# Patient Record
Sex: Male | Born: 1935
Health system: Southern US, Community
[De-identification: ages and names within clinical notes are randomized; demographics above are authoritative.]

## PROBLEM LIST (undated history)

## (undated) DIAGNOSIS — I429 Cardiomyopathy, unspecified: Secondary | ICD-10-CM

## (undated) DIAGNOSIS — IMO0001 Reserved for inherently not codable concepts without codable children: Secondary | ICD-10-CM

## (undated) DIAGNOSIS — I10 Essential (primary) hypertension: Secondary | ICD-10-CM

## (undated) DIAGNOSIS — K5733 Diverticulitis of large intestine without perforation or abscess with bleeding: Secondary | ICD-10-CM

## (undated) DIAGNOSIS — N183 Chronic kidney disease, stage 3 unspecified: Secondary | ICD-10-CM

## (undated) DIAGNOSIS — G8929 Other chronic pain: Secondary | ICD-10-CM

## (undated) DIAGNOSIS — I340 Nonrheumatic mitral (valve) insufficiency: Secondary | ICD-10-CM

## (undated) DIAGNOSIS — G4730 Sleep apnea, unspecified: Secondary | ICD-10-CM

## (undated) DIAGNOSIS — I447 Left bundle-branch block, unspecified: Secondary | ICD-10-CM

## (undated) DIAGNOSIS — N401 Enlarged prostate with lower urinary tract symptoms: Secondary | ICD-10-CM

## (undated) DIAGNOSIS — F32A Depression, unspecified: Secondary | ICD-10-CM

## (undated) DIAGNOSIS — M545 Low back pain, unspecified: Secondary | ICD-10-CM

## (undated) DIAGNOSIS — R339 Retention of urine, unspecified: Secondary | ICD-10-CM

## (undated) DIAGNOSIS — H9190 Unspecified hearing loss, unspecified ear: Secondary | ICD-10-CM

## (undated) DIAGNOSIS — H4310 Vitreous hemorrhage, unspecified eye: Secondary | ICD-10-CM

## (undated) DIAGNOSIS — K219 Gastro-esophageal reflux disease without esophagitis: Secondary | ICD-10-CM

## (undated) DIAGNOSIS — Z8719 Personal history of other diseases of the digestive system: Secondary | ICD-10-CM

## (undated) DIAGNOSIS — H409 Unspecified glaucoma: Secondary | ICD-10-CM

## (undated) DIAGNOSIS — F419 Anxiety disorder, unspecified: Secondary | ICD-10-CM

## (undated) DIAGNOSIS — F329 Major depressive disorder, single episode, unspecified: Secondary | ICD-10-CM

## (undated) DIAGNOSIS — M1990 Unspecified osteoarthritis, unspecified site: Secondary | ICD-10-CM

## (undated) DIAGNOSIS — N138 Other obstructive and reflux uropathy: Secondary | ICD-10-CM

## (undated) DIAGNOSIS — Z5189 Encounter for other specified aftercare: Secondary | ICD-10-CM

## (undated) DIAGNOSIS — I2510 Atherosclerotic heart disease of native coronary artery without angina pectoris: Secondary | ICD-10-CM

## (undated) DIAGNOSIS — I5022 Chronic systolic (congestive) heart failure: Secondary | ICD-10-CM

## (undated) DIAGNOSIS — I4821 Permanent atrial fibrillation: Secondary | ICD-10-CM

## (undated) DIAGNOSIS — Z973 Presence of spectacles and contact lenses: Secondary | ICD-10-CM

## (undated) DIAGNOSIS — Z950 Presence of cardiac pacemaker: Secondary | ICD-10-CM

## (undated) DIAGNOSIS — I219 Acute myocardial infarction, unspecified: Secondary | ICD-10-CM

## (undated) DIAGNOSIS — C61 Malignant neoplasm of prostate: Secondary | ICD-10-CM

## (undated) DIAGNOSIS — I480 Paroxysmal atrial fibrillation: Secondary | ICD-10-CM

## (undated) DIAGNOSIS — N529 Male erectile dysfunction, unspecified: Secondary | ICD-10-CM

## (undated) DIAGNOSIS — IMO0002 Reserved for concepts with insufficient information to code with codable children: Secondary | ICD-10-CM

## (undated) DIAGNOSIS — H9191 Unspecified hearing loss, right ear: Secondary | ICD-10-CM

## (undated) DIAGNOSIS — R001 Bradycardia, unspecified: Secondary | ICD-10-CM

## (undated) DIAGNOSIS — D329 Benign neoplasm of meninges, unspecified: Secondary | ICD-10-CM

## (undated) DIAGNOSIS — Z974 Presence of external hearing-aid: Secondary | ICD-10-CM

## (undated) HISTORY — PX: TRANSURETHRAL RESECTION OF PROSTATE: SHX73

## (undated) HISTORY — PX: SHOULDER OPEN ROTATOR CUFF REPAIR: SHX2407

## (undated) HISTORY — PX: CLOSED REDUCTION HIP DISLOCATION: SUR221

## (undated) HISTORY — PX: APPENDECTOMY: SHX54

## (undated) HISTORY — PX: CHOLECYSTECTOMY: SHX55

## (undated) HISTORY — PX: INGUINAL HERNIA REPAIR: SUR1180

## (undated) HISTORY — PX: PENILE PROSTHESIS IMPLANT: SHX240

## (undated) HISTORY — PX: CATARACT EXTRACTION W/ INTRAOCULAR LENS  IMPLANT, BILATERAL: SHX1307

## (undated) HISTORY — PX: TOTAL HIP ARTHROPLASTY: SHX124

## (undated) HISTORY — PX: INNER EAR SURGERY: SHX679

## (undated) HISTORY — PX: REVISION TOTAL KNEE ARTHROPLASTY: SUR1280

## (undated) HISTORY — PX: TOTAL HIP REVISION: SHX763

---

## 1988-10-27 HISTORY — PX: PENILE PROSTHESIS IMPLANT: SHX240

## 1995-02-27 HISTORY — PX: TOTAL KNEE ARTHROPLASTY: SHX125

## 1998-01-31 ENCOUNTER — Encounter: Payer: Self-pay | Admitting: Neurological Surgery

## 1998-01-31 ENCOUNTER — Ambulatory Visit (HOSPITAL_COMMUNITY): Admission: RE | Admit: 1998-01-31 | Discharge: 1998-01-31 | Payer: Self-pay | Admitting: Neurological Surgery

## 1998-02-26 HISTORY — PX: OTHER SURGICAL HISTORY: SHX169

## 1998-03-02 ENCOUNTER — Ambulatory Visit (HOSPITAL_COMMUNITY): Admission: RE | Admit: 1998-03-02 | Discharge: 1998-03-02 | Payer: Self-pay | Admitting: Neurological Surgery

## 1998-03-02 ENCOUNTER — Encounter: Payer: Self-pay | Admitting: Neurological Surgery

## 1999-03-10 ENCOUNTER — Encounter: Payer: Self-pay | Admitting: Orthopedic Surgery

## 1999-03-10 ENCOUNTER — Encounter: Admission: RE | Admit: 1999-03-10 | Discharge: 1999-03-10 | Payer: Self-pay | Admitting: Orthopedic Surgery

## 1999-10-29 ENCOUNTER — Ambulatory Visit (HOSPITAL_COMMUNITY): Admission: RE | Admit: 1999-10-29 | Discharge: 1999-10-29 | Payer: Self-pay | Admitting: Neurosurgery

## 1999-11-03 ENCOUNTER — Ambulatory Visit (HOSPITAL_COMMUNITY): Admission: RE | Admit: 1999-11-03 | Discharge: 1999-11-03 | Payer: Self-pay | Admitting: Neurosurgery

## 2000-01-01 ENCOUNTER — Encounter: Admission: RE | Admit: 2000-01-01 | Discharge: 2000-01-11 | Payer: Self-pay | Admitting: Gastroenterology

## 2000-01-11 ENCOUNTER — Encounter: Payer: Self-pay | Admitting: General Surgery

## 2000-01-11 ENCOUNTER — Ambulatory Visit (HOSPITAL_COMMUNITY): Admission: RE | Admit: 2000-01-11 | Discharge: 2000-01-11 | Payer: Self-pay | Admitting: General Surgery

## 2000-01-12 ENCOUNTER — Inpatient Hospital Stay (HOSPITAL_COMMUNITY): Admission: RE | Admit: 2000-01-12 | Discharge: 2000-01-17 | Payer: Self-pay | Admitting: General Surgery

## 2000-08-05 ENCOUNTER — Encounter: Payer: Self-pay | Admitting: Anesthesiology

## 2000-08-05 ENCOUNTER — Encounter: Admission: RE | Admit: 2000-08-05 | Discharge: 2000-08-05 | Payer: Self-pay | Admitting: Anesthesiology

## 2001-05-30 ENCOUNTER — Encounter: Payer: Self-pay | Admitting: Gastroenterology

## 2001-05-30 ENCOUNTER — Encounter: Admission: RE | Admit: 2001-05-30 | Discharge: 2001-05-30 | Payer: Self-pay | Admitting: Gastroenterology

## 2002-02-26 HISTORY — PX: TOTAL KNEE ARTHROPLASTY: SHX125

## 2002-07-08 ENCOUNTER — Encounter: Payer: Self-pay | Admitting: Gastroenterology

## 2002-07-08 ENCOUNTER — Encounter: Admission: RE | Admit: 2002-07-08 | Discharge: 2002-07-08 | Payer: Self-pay | Admitting: Gastroenterology

## 2002-11-15 ENCOUNTER — Ambulatory Visit (HOSPITAL_BASED_OUTPATIENT_CLINIC_OR_DEPARTMENT_OTHER): Admission: RE | Admit: 2002-11-15 | Discharge: 2002-11-15 | Payer: Self-pay | Admitting: Internal Medicine

## 2003-02-27 HISTORY — PX: TOTAL HIP ARTHROPLASTY: SHX124

## 2003-05-20 ENCOUNTER — Emergency Department (HOSPITAL_COMMUNITY): Admission: EM | Admit: 2003-05-20 | Discharge: 2003-05-20 | Payer: Self-pay | Admitting: Emergency Medicine

## 2003-08-19 ENCOUNTER — Encounter: Admission: RE | Admit: 2003-08-19 | Discharge: 2003-08-19 | Payer: Self-pay | Admitting: Orthopedic Surgery

## 2003-11-08 ENCOUNTER — Inpatient Hospital Stay (HOSPITAL_COMMUNITY): Admission: RE | Admit: 2003-11-08 | Discharge: 2003-11-12 | Payer: Self-pay | Admitting: Orthopedic Surgery

## 2004-01-05 ENCOUNTER — Encounter: Admission: RE | Admit: 2004-01-05 | Discharge: 2004-01-05 | Payer: Self-pay | Admitting: Gastroenterology

## 2004-02-15 ENCOUNTER — Ambulatory Visit (HOSPITAL_COMMUNITY): Admission: RE | Admit: 2004-02-15 | Discharge: 2004-02-15 | Payer: Self-pay | Admitting: Gastroenterology

## 2004-03-03 ENCOUNTER — Ambulatory Visit: Payer: Self-pay | Admitting: Internal Medicine

## 2004-05-16 ENCOUNTER — Ambulatory Visit: Payer: Self-pay | Admitting: Internal Medicine

## 2005-01-09 ENCOUNTER — Encounter: Admission: RE | Admit: 2005-01-09 | Discharge: 2005-01-09 | Payer: Self-pay | Admitting: Ophthalmology

## 2005-01-12 ENCOUNTER — Ambulatory Visit (HOSPITAL_BASED_OUTPATIENT_CLINIC_OR_DEPARTMENT_OTHER): Admission: RE | Admit: 2005-01-12 | Discharge: 2005-01-12 | Payer: Self-pay | Admitting: Ophthalmology

## 2005-01-12 ENCOUNTER — Ambulatory Visit (HOSPITAL_COMMUNITY): Admission: RE | Admit: 2005-01-12 | Discharge: 2005-01-12 | Payer: Self-pay | Admitting: Ophthalmology

## 2005-02-26 HISTORY — PX: CARDIAC CATHETERIZATION: SHX172

## 2005-05-17 ENCOUNTER — Encounter: Admission: RE | Admit: 2005-05-17 | Discharge: 2005-05-17 | Payer: Self-pay | Admitting: Cardiology

## 2005-05-24 ENCOUNTER — Ambulatory Visit (HOSPITAL_COMMUNITY): Admission: RE | Admit: 2005-05-24 | Discharge: 2005-05-26 | Payer: Self-pay | Admitting: *Deleted

## 2005-05-25 ENCOUNTER — Encounter: Payer: Self-pay | Admitting: Vascular Surgery

## 2005-06-12 ENCOUNTER — Ambulatory Visit (HOSPITAL_COMMUNITY): Admission: RE | Admit: 2005-06-12 | Discharge: 2005-06-12 | Payer: Self-pay | Admitting: Cardiology

## 2005-06-19 ENCOUNTER — Ambulatory Visit (HOSPITAL_COMMUNITY): Admission: RE | Admit: 2005-06-19 | Discharge: 2005-06-19 | Payer: Self-pay | Admitting: Cardiology

## 2008-03-25 ENCOUNTER — Inpatient Hospital Stay (HOSPITAL_COMMUNITY): Admission: RE | Admit: 2008-03-25 | Discharge: 2008-03-27 | Payer: Self-pay | Admitting: Orthopedic Surgery

## 2008-03-25 HISTORY — PX: TOTAL HIP ARTHROPLASTY: SHX124

## 2008-04-19 ENCOUNTER — Encounter: Admission: RE | Admit: 2008-04-19 | Discharge: 2008-05-04 | Payer: Self-pay | Admitting: Orthopedic Surgery

## 2008-04-28 ENCOUNTER — Inpatient Hospital Stay (HOSPITAL_COMMUNITY): Admission: EM | Admit: 2008-04-28 | Discharge: 2008-04-29 | Payer: Self-pay | Admitting: Emergency Medicine

## 2008-07-13 ENCOUNTER — Encounter: Admission: RE | Admit: 2008-07-13 | Discharge: 2008-07-13 | Payer: Self-pay | Admitting: Cardiology

## 2008-07-14 ENCOUNTER — Inpatient Hospital Stay (HOSPITAL_BASED_OUTPATIENT_CLINIC_OR_DEPARTMENT_OTHER): Admission: RE | Admit: 2008-07-14 | Discharge: 2008-07-14 | Payer: Self-pay | Admitting: Cardiology

## 2008-08-03 ENCOUNTER — Inpatient Hospital Stay (HOSPITAL_COMMUNITY): Admission: AD | Admit: 2008-08-03 | Discharge: 2008-08-04 | Payer: Self-pay | Admitting: Cardiology

## 2008-08-03 HISTORY — PX: CORONARY ANGIOPLASTY WITH STENT PLACEMENT: SHX49

## 2008-10-14 ENCOUNTER — Ambulatory Visit: Admission: RE | Admit: 2008-10-14 | Discharge: 2008-10-14 | Payer: Self-pay | Admitting: Cardiology

## 2008-10-17 ENCOUNTER — Emergency Department (HOSPITAL_COMMUNITY): Admission: EM | Admit: 2008-10-17 | Discharge: 2008-10-17 | Payer: Self-pay | Admitting: Emergency Medicine

## 2008-10-22 ENCOUNTER — Observation Stay (HOSPITAL_COMMUNITY): Admission: AD | Admit: 2008-10-22 | Discharge: 2008-10-24 | Payer: Self-pay | Admitting: Gastroenterology

## 2008-12-27 ENCOUNTER — Ambulatory Visit (HOSPITAL_COMMUNITY): Admission: RE | Admit: 2008-12-27 | Discharge: 2008-12-27 | Payer: Self-pay | Admitting: Orthopedic Surgery

## 2009-05-23 ENCOUNTER — Inpatient Hospital Stay (HOSPITAL_COMMUNITY): Admission: RE | Admit: 2009-05-23 | Discharge: 2009-05-27 | Payer: Self-pay | Admitting: Orthopedic Surgery

## 2009-06-20 ENCOUNTER — Ambulatory Visit (HOSPITAL_COMMUNITY): Admission: EM | Admit: 2009-06-20 | Discharge: 2009-06-21 | Payer: Self-pay | Admitting: Emergency Medicine

## 2009-06-22 ENCOUNTER — Inpatient Hospital Stay (HOSPITAL_COMMUNITY): Admission: EM | Admit: 2009-06-22 | Discharge: 2009-06-30 | Payer: Self-pay | Admitting: Emergency Medicine

## 2010-04-26 ENCOUNTER — Other Ambulatory Visit: Payer: Self-pay | Admitting: Ophthalmology

## 2010-04-26 DIAGNOSIS — H509 Unspecified strabismus: Secondary | ICD-10-CM

## 2010-05-01 ENCOUNTER — Other Ambulatory Visit: Payer: Self-pay

## 2010-05-03 ENCOUNTER — Other Ambulatory Visit: Payer: Self-pay

## 2010-05-05 ENCOUNTER — Other Ambulatory Visit: Payer: Self-pay

## 2010-05-16 ENCOUNTER — Other Ambulatory Visit: Payer: Self-pay | Admitting: Gastroenterology

## 2010-05-16 LAB — CBC
HCT: 32.7 % — ABNORMAL LOW (ref 39.0–52.0)
HCT: 35.4 % — ABNORMAL LOW (ref 39.0–52.0)
HCT: 35.5 % — ABNORMAL LOW (ref 39.0–52.0)
HCT: 36 % — ABNORMAL LOW (ref 39.0–52.0)
Hemoglobin: 12.3 g/dL — ABNORMAL LOW (ref 13.0–17.0)
Hemoglobin: 13.8 g/dL (ref 13.0–17.0)
Hemoglobin: 12.2 g/dL — ABNORMAL LOW (ref 13.0–17.0)
Hemoglobin: 12.4 g/dL — ABNORMAL LOW (ref 13.0–17.0)
MCHC: 34.4 g/dL (ref 30.0–36.0)
MCHC: 34.4 g/dL (ref 30.0–36.0)
MCHC: 34.8 g/dL (ref 30.0–36.0)
MCHC: 34.2 g/dL (ref 30.0–36.0)
MCHC: 34.4 g/dL (ref 30.0–36.0)
MCV: 91 fL (ref 78.0–100.0)
MCV: 91.7 fL (ref 78.0–100.0)
MCV: 92.3 fL (ref 78.0–100.0)
Platelets: 104 10*3/uL — ABNORMAL LOW (ref 150–400)
Platelets: 115 10*3/uL — ABNORMAL LOW (ref 150–400)
Platelets: 122 10*3/uL — ABNORMAL LOW (ref 150–400)
Platelets: 136 10*3/uL — ABNORMAL LOW (ref 150–400)
Platelets: 111 10*3/uL — ABNORMAL LOW (ref 150–400)
Platelets: 122 10*3/uL — ABNORMAL LOW (ref 150–400)
RBC: 3.87 MIL/uL — ABNORMAL LOW (ref 4.22–5.81)
RBC: 4.38 MIL/uL (ref 4.22–5.81)
RBC: 3.89 MIL/uL — ABNORMAL LOW (ref 4.22–5.81)
RBC: 4.22 MIL/uL (ref 4.22–5.81)
RBC: 4.4 MIL/uL (ref 4.22–5.81)
RDW: 14.1 % (ref 11.5–15.5)
RDW: 14.6 % (ref 11.5–15.5)
RDW: 14.1 % (ref 11.5–15.5)
RDW: 14.2 % (ref 11.5–15.5)
WBC: 6 10*3/uL (ref 4.0–10.5)
WBC: 8.6 10*3/uL (ref 4.0–10.5)
WBC: 9.5 10*3/uL (ref 4.0–10.5)
WBC: 9.9 10*3/uL (ref 4.0–10.5)
WBC: 10.7 10*3/uL — ABNORMAL HIGH (ref 4.0–10.5)
WBC: 5.8 10*3/uL (ref 4.0–10.5)
WBC: 9.3 10*3/uL (ref 4.0–10.5)

## 2010-05-16 LAB — BASIC METABOLIC PANEL
BUN: 12 mg/dL (ref 6–23)
BUN: 8 mg/dL (ref 6–23)
BUN: 8 mg/dL (ref 6–23)
BUN: 9 mg/dL (ref 6–23)
BUN: 10 mg/dL (ref 6–23)
BUN: 7 mg/dL (ref 6–23)
CO2: 27 mEq/L (ref 19–32)
CO2: 27 mEq/L (ref 19–32)
CO2: 28 mEq/L (ref 19–32)
CO2: 27 mEq/L (ref 19–32)
Calcium: 9.1 mg/dL (ref 8.4–10.5)
Calcium: 9.3 mg/dL (ref 8.4–10.5)
Calcium: 8.2 mg/dL — ABNORMAL LOW (ref 8.4–10.5)
Calcium: 8.6 mg/dL (ref 8.4–10.5)
Calcium: 8.7 mg/dL (ref 8.4–10.5)
Chloride: 104 mEq/L (ref 96–112)
Chloride: 104 mEq/L (ref 96–112)
Chloride: 105 mEq/L (ref 96–112)
Creatinine, Ser: 0.93 mg/dL (ref 0.4–1.5)
Creatinine, Ser: 1.1 mg/dL (ref 0.4–1.5)
Creatinine, Ser: 1.12 mg/dL (ref 0.4–1.5)
GFR calc Af Amer: >60 mL/min (ref >60)
GFR calc Af Amer: >60 mL/min (ref >60)
GFR calc Af Amer: >60 mL/min (ref >60)
GFR calc non Af Amer: 59 mL/min — ABNORMAL LOW (ref >60)
GFR calc non Af Amer: >60 mL/min (ref >60)
GFR calc non Af Amer: >60 mL/min (ref >60)
GFR calc non Af Amer: >60 mL/min (ref >60)
GFR calc non Af Amer: >60 mL/min (ref >60)
Glucose, Bld: 100 mg/dL — ABNORMAL HIGH (ref 70–99)
Glucose, Bld: 106 mg/dL — ABNORMAL HIGH (ref 70–99)
Glucose, Bld: 101 mg/dL — ABNORMAL HIGH (ref 70–99)
Glucose, Bld: 115 mg/dL — ABNORMAL HIGH (ref 70–99)
Potassium: 4.1 mEq/L (ref 3.5–5.1)
Potassium: 4.2 mEq/L (ref 3.5–5.1)
Potassium: 4.2 mEq/L (ref 3.5–5.1)
Sodium: 137 mEq/L (ref 135–145)
Sodium: 140 mEq/L (ref 135–145)
Sodium: 134 mEq/L — ABNORMAL LOW (ref 135–145)
Sodium: 137 mEq/L (ref 135–145)
Sodium: 140 mEq/L (ref 135–145)

## 2010-05-16 LAB — COMPREHENSIVE METABOLIC PANEL
AST: 28 U/L (ref 0–37)
BUN: 13 mg/dL (ref 6–23)
CO2: 24 mEq/L (ref 19–32)
Calcium: 8.7 mg/dL (ref 8.4–10.5)
Creatinine, Ser: 1.09 mg/dL (ref 0.4–1.5)
GFR calc Af Amer: >60 mL/min (ref >60)
GFR calc non Af Amer: >60 mL/min (ref >60)
Glucose, Bld: 106 mg/dL — ABNORMAL HIGH (ref 70–99)

## 2010-05-16 LAB — URINALYSIS, MICROSCOPIC ONLY
Bilirubin Urine: NEGATIVE
Glucose, UA: NEGATIVE mg/dL
Ketones, ur: NEGATIVE mg/dL
pH: 5.5 (ref 5.0–8.0)

## 2010-05-16 LAB — DIFFERENTIAL
Basophils Absolute: 0 10*3/uL (ref 0.0–0.1)
Basophils Relative: 0 % (ref 0–1)
Monocytes Absolute: 0.6 10*3/uL (ref 0.1–1.0)
Neutro Abs: 9 10*3/uL — ABNORMAL HIGH (ref 1.7–7.7)
Neutrophils Relative %: 83 % — ABNORMAL HIGH (ref 43–77)

## 2010-05-16 LAB — PROTIME-INR
INR: 1.1 (ref 0.00–1.49)
Prothrombin Time: 13.3 seconds (ref 11.6–15.2)
Prothrombin Time: 14.1 seconds (ref 11.6–15.2)

## 2010-05-16 LAB — TYPE AND SCREEN
ABO/RH(D): O POS
ABO/RH(D): O POS
ABO/RH(D): O POS
Antibody Screen: NEGATIVE
Antibody Screen: NEGATIVE

## 2010-05-16 LAB — URINE CULTURE

## 2010-05-16 LAB — APTT: aPTT: 29 seconds (ref 24–37)

## 2010-05-17 LAB — BASIC METABOLIC PANEL
BUN: 7 mg/dL (ref 6–23)
Chloride: 104 mEq/L (ref 96–112)
Glucose, Bld: 110 mg/dL — ABNORMAL HIGH (ref 70–99)
Potassium: 3.5 mEq/L (ref 3.5–5.1)

## 2010-05-19 ENCOUNTER — Ambulatory Visit
Admission: RE | Admit: 2010-05-19 | Discharge: 2010-05-19 | Disposition: A | Discharge status: Home / Self Care | Payer: MEDICARE | Source: Ambulatory Visit | Attending: Gastroenterology | Admitting: Gastroenterology

## 2010-05-21 LAB — COMPREHENSIVE METABOLIC PANEL
Albumin: 4.1 g/dL (ref 3.5–5.2)
BUN: 13 mg/dL (ref 6–23)
Chloride: 107 mEq/L (ref 96–112)
Creatinine, Ser: 1.09 mg/dL (ref 0.4–1.5)
GFR calc non Af Amer: >60 mL/min (ref >60)
Total Bilirubin: 0.5 mg/dL (ref 0.3–1.2)

## 2010-05-21 LAB — BASIC METABOLIC PANEL
BUN: 10 mg/dL (ref 6–23)
BUN: 10 mg/dL (ref 6–23)
BUN: 6 mg/dL (ref 6–23)
Calcium: 8.1 mg/dL — ABNORMAL LOW (ref 8.4–10.5)
Calcium: 8.4 mg/dL (ref 8.4–10.5)
Chloride: 109 mEq/L (ref 96–112)
GFR calc non Af Amer: >60 mL/min (ref >60)
GFR calc non Af Amer: >60 mL/min (ref >60)
GFR calc non Af Amer: >60 mL/min (ref >60)
Glucose, Bld: 132 mg/dL — ABNORMAL HIGH (ref 70–99)
Potassium: 3.9 mEq/L (ref 3.5–5.1)
Potassium: 4 mEq/L (ref 3.5–5.1)
Sodium: 139 mEq/L (ref 135–145)
Sodium: 140 mEq/L (ref 135–145)

## 2010-05-21 LAB — CBC
HCT: 33.8 % — ABNORMAL LOW (ref 39.0–52.0)
HCT: 34.6 % — ABNORMAL LOW (ref 39.0–52.0)
HCT: 37.1 % — ABNORMAL LOW (ref 39.0–52.0)
HCT: 48.3 % (ref 39.0–52.0)
Hemoglobin: 11.6 g/dL — ABNORMAL LOW (ref 13.0–17.0)
Hemoglobin: 11.7 g/dL — ABNORMAL LOW (ref 13.0–17.0)
MCHC: 34.3 g/dL (ref 30.0–36.0)
MCV: 93.8 fL (ref 78.0–100.0)
MCV: 94.2 fL (ref 78.0–100.0)
Platelets: 119 10*3/uL — ABNORMAL LOW (ref 150–400)
Platelets: 124 10*3/uL — ABNORMAL LOW (ref 150–400)
Platelets: 124 10*3/uL — ABNORMAL LOW (ref 150–400)
RBC: 3.6 MIL/uL — ABNORMAL LOW (ref 4.22–5.81)
RBC: 3.68 MIL/uL — ABNORMAL LOW (ref 4.22–5.81)
RDW: 14.6 % (ref 11.5–15.5)
RDW: 14.7 % (ref 11.5–15.5)
WBC: 7.2 10*3/uL (ref 4.0–10.5)
WBC: 7.9 10*3/uL (ref 4.0–10.5)
WBC: 8.9 10*3/uL (ref 4.0–10.5)

## 2010-05-21 LAB — DIFFERENTIAL
Basophils Absolute: 0 10*3/uL (ref 0.0–0.1)
Lymphocytes Relative: 22 % (ref 12–46)
Monocytes Absolute: 0.7 10*3/uL (ref 0.1–1.0)
Neutro Abs: 4.5 10*3/uL (ref 1.7–7.7)
Neutrophils Relative %: 63 % (ref 43–77)

## 2010-05-21 LAB — PROTIME-INR
INR: 1.02 (ref 0.00–1.49)
Prothrombin Time: 13.3 seconds (ref 11.6–15.2)

## 2010-05-21 LAB — CARDIAC PANEL(CRET KIN+CKTOT+MB+TROPI)
CK, MB: 0.8 ng/mL (ref 0.3–4.0)
CK, MB: 0.9 ng/mL (ref 0.3–4.0)
CK, MB: 1.1 ng/mL (ref 0.3–4.0)
Relative Index: INVALID (ref 0.0–2.5)
Total CK: 73 U/L (ref 7–232)
Total CK: 83 U/L (ref 7–232)
Troponin I: 0.01 ng/mL (ref 0.00–0.06)

## 2010-05-21 LAB — URINALYSIS, ROUTINE W REFLEX MICROSCOPIC
Ketones, ur: NEGATIVE mg/dL
Nitrite: NEGATIVE
Protein, ur: NEGATIVE mg/dL
Urobilinogen, UA: 0.2 mg/dL (ref 0.0–1.0)

## 2010-05-21 LAB — CROSSMATCH
ABO/RH(D): O POS
Antibody Screen: NEGATIVE

## 2010-05-21 LAB — APTT: aPTT: 27 seconds (ref 24–37)

## 2010-05-21 LAB — ABO/RH: ABO/RH(D): O POS

## 2010-05-28 DIAGNOSIS — Z8719 Personal history of other diseases of the digestive system: Secondary | ICD-10-CM

## 2010-05-28 HISTORY — DX: Personal history of other diseases of the digestive system: Z87.19

## 2010-05-31 LAB — CBC
HCT: 43.1 % (ref 39.0–52.0)
MCHC: 33.1 g/dL (ref 30.0–36.0)
MCV: 87.6 fL (ref 78.0–100.0)
RBC: 4.92 MIL/uL (ref 4.22–5.81)

## 2010-05-31 LAB — DIFFERENTIAL
Basophils Absolute: 0 10*3/uL (ref 0.0–0.1)
Lymphocytes Relative: 20 % (ref 12–46)
Lymphs Abs: 1.5 10*3/uL (ref 0.7–4.0)
Neutro Abs: 5 10*3/uL (ref 1.7–7.7)
Neutrophils Relative %: 67 % (ref 43–77)

## 2010-05-31 LAB — COMPREHENSIVE METABOLIC PANEL
AST: 27 U/L (ref 0–37)
BUN: 15 mg/dL (ref 6–23)
CO2: 25 mEq/L (ref 19–32)
Calcium: 9.1 mg/dL (ref 8.4–10.5)
Chloride: 109 mEq/L (ref 96–112)
Creatinine, Ser: 1.06 mg/dL (ref 0.4–1.5)
GFR calc Af Amer: >60 mL/min (ref >60)
GFR calc non Af Amer: >60 mL/min (ref >60)
Glucose, Bld: 95 mg/dL (ref 70–99)
Total Bilirubin: 0.6 mg/dL (ref 0.3–1.2)

## 2010-05-31 LAB — URINALYSIS, ROUTINE W REFLEX MICROSCOPIC
Bilirubin Urine: NEGATIVE
Glucose, UA: NEGATIVE mg/dL
Hgb urine dipstick: NEGATIVE
Specific Gravity, Urine: 1.016 (ref 1.005–1.030)
Urobilinogen, UA: 0.2 mg/dL (ref 0.0–1.0)

## 2010-05-31 LAB — PROTIME-INR
INR: 1.05 (ref 0.00–1.49)
Prothrombin Time: 13.6 seconds (ref 11.6–15.2)

## 2010-05-31 LAB — APTT: aPTT: 30 seconds (ref 24–37)

## 2010-06-03 LAB — TYPE AND SCREEN: Antibody Screen: NEGATIVE

## 2010-06-03 LAB — CBC
HCT: 35.4 % — ABNORMAL LOW (ref 39.0–52.0)
HCT: 35.5 % — ABNORMAL LOW (ref 39.0–52.0)
Hemoglobin: 11.8 g/dL — ABNORMAL LOW (ref 13.0–17.0)
Hemoglobin: 11.9 g/dL — ABNORMAL LOW (ref 13.0–17.0)
MCHC: 33.3 g/dL (ref 30.0–36.0)
MCHC: 34.5 g/dL (ref 30.0–36.0)
MCV: 91.6 fL (ref 78.0–100.0)
MCV: 94.1 fL (ref 78.0–100.0)
Platelets: 162 10*3/uL (ref 150–400)
Platelets: 155 10*3/uL (ref 150–400)
RBC: 2.9 MIL/uL — ABNORMAL LOW (ref 4.22–5.81)
RBC: 3.87 MIL/uL — ABNORMAL LOW (ref 4.22–5.81)
RBC: 3.91 MIL/uL — ABNORMAL LOW (ref 4.22–5.81)
RBC: 3.46 MIL/uL — ABNORMAL LOW (ref 4.22–5.81)
RDW: 15.9 % — ABNORMAL HIGH (ref 11.5–15.5)
RDW: 14.6 % (ref 11.5–15.5)
WBC: 5.9 10*3/uL (ref 4.0–10.5)
WBC: 6 10*3/uL (ref 4.0–10.5)

## 2010-06-03 LAB — COMPREHENSIVE METABOLIC PANEL
ALT: 18 U/L (ref 0–53)
AST: 20 U/L (ref 0–37)
Albumin: 3.2 g/dL — ABNORMAL LOW (ref 3.5–5.2)
CO2: 26 mEq/L (ref 19–32)
Calcium: 8.3 mg/dL — ABNORMAL LOW (ref 8.4–10.5)
Chloride: 109 mEq/L (ref 96–112)
Creatinine, Ser: 0.93 mg/dL (ref 0.4–1.5)
GFR calc Af Amer: >60 mL/min (ref >60)
GFR calc non Af Amer: >60 mL/min (ref >60)
Sodium: 140 mEq/L (ref 135–145)
Total Bilirubin: 0.7 mg/dL (ref 0.3–1.2)

## 2010-06-03 LAB — DIFFERENTIAL
Basophils Absolute: 0 10*3/uL (ref 0.0–0.1)
Basophils Relative: 0 % (ref 0–1)
Eosinophils Absolute: 0.3 10*3/uL (ref 0.0–0.7)
Eosinophils Absolute: 0.1 10*3/uL (ref 0.0–0.7)
Eosinophils Relative: 5 % (ref 0–5)
Lymphocytes Relative: 19 % (ref 12–46)
Lymphs Abs: 1.1 10*3/uL (ref 0.7–4.0)
Monocytes Absolute: 0.5 10*3/uL (ref 0.1–1.0)
Monocytes Relative: 9 % (ref 3–12)
Neutrophils Relative %: 72 % (ref 43–77)

## 2010-06-03 LAB — POCT I-STAT, CHEM 8
BUN: 18 mg/dL (ref 6–23)
Calcium, Ion: 1.05 mmol/L — ABNORMAL LOW (ref 1.12–1.32)
Hemoglobin: 10.9 g/dL — ABNORMAL LOW (ref 13.0–17.0)
TCO2: 20 mmol/L (ref 0–100)

## 2010-06-03 LAB — PROTIME-INR
INR: 1.1 (ref 0.00–1.49)
Prothrombin Time: 13.3 seconds (ref 11.6–15.2)

## 2010-06-03 LAB — PREPARE RBC (CROSSMATCH)

## 2010-06-03 LAB — HEMOCCULT GUIAC POC 1CARD (OFFICE): Fecal Occult Bld: POSITIVE

## 2010-06-05 LAB — BASIC METABOLIC PANEL
BUN: 11 mg/dL (ref 6–23)
CO2: 25 mEq/L (ref 19–32)
Chloride: 108 mEq/L (ref 96–112)
Creatinine, Ser: 0.98 mg/dL (ref 0.4–1.5)

## 2010-06-05 LAB — CBC
MCHC: 33.8 g/dL (ref 30.0–36.0)
MCV: 91.8 fL (ref 78.0–100.0)
Platelets: 123 10*3/uL — ABNORMAL LOW (ref 150–400)

## 2010-06-06 LAB — POCT I-STAT 3, VENOUS BLOOD GAS (G3P V)
Bicarbonate: 24.3 mEq/L — ABNORMAL HIGH (ref 20.0–24.0)
pH, Ven: 7.385 — ABNORMAL HIGH (ref 7.250–7.300)
pH, Ven: 7.386 — ABNORMAL HIGH (ref 7.250–7.300)

## 2010-06-06 LAB — POCT I-STAT 3, ART BLOOD GAS (G3+)
Acid-base deficit: 1 mmol/L (ref 0.0–2.0)
Bicarbonate: 22.6 mEq/L (ref 20.0–24.0)
pCO2 arterial: 34.4 mmHg — ABNORMAL LOW (ref 35.0–45.0)
pO2, Arterial: 64 mmHg — ABNORMAL LOW (ref 80.0–100.0)

## 2010-06-08 ENCOUNTER — Ambulatory Visit (HOSPITAL_COMMUNITY)
Admission: RE | Admit: 2010-06-08 | Discharge: 2010-06-08 | Disposition: A | Discharge status: Home / Self Care | Payer: Medicare Other | Source: Ambulatory Visit | Attending: Gastroenterology | Admitting: Gastroenterology

## 2010-06-08 DIAGNOSIS — R1310 Dysphagia, unspecified: Secondary | ICD-10-CM | POA: Insufficient documentation

## 2010-06-08 DIAGNOSIS — Z7902 Long term (current) use of antithrombotics/antiplatelets: Secondary | ICD-10-CM | POA: Insufficient documentation

## 2010-06-08 DIAGNOSIS — Z7982 Long term (current) use of aspirin: Secondary | ICD-10-CM | POA: Insufficient documentation

## 2010-06-08 LAB — TSH: TSH: 1.449 u[IU]/mL (ref 0.350–4.500)

## 2010-06-08 LAB — URINE CULTURE

## 2010-06-08 LAB — BASIC METABOLIC PANEL
BUN: 16 mg/dL (ref 6–23)
CO2: 21 mEq/L (ref 19–32)
Chloride: 110 mEq/L (ref 96–112)
GFR calc non Af Amer: >60 mL/min (ref >60)
Glucose, Bld: 114 mg/dL — ABNORMAL HIGH (ref 70–99)
Potassium: 3.8 mEq/L (ref 3.5–5.1)

## 2010-06-08 LAB — URINALYSIS, ROUTINE W REFLEX MICROSCOPIC
Bilirubin Urine: NEGATIVE
Glucose, UA: NEGATIVE mg/dL
Ketones, ur: NEGATIVE mg/dL
Protein, ur: NEGATIVE mg/dL
pH: 7 (ref 5.0–8.0)

## 2010-06-08 LAB — CBC
HCT: 37.3 % — ABNORMAL LOW (ref 39.0–52.0)
Hemoglobin: 12.5 g/dL — ABNORMAL LOW (ref 13.0–17.0)
MCHC: 33.5 g/dL (ref 30.0–36.0)
MCV: 93.1 fL (ref 78.0–100.0)
Platelets: 113 10*3/uL — ABNORMAL LOW (ref 150–400)
RDW: 14.9 % (ref 11.5–15.5)

## 2010-06-08 LAB — DIFFERENTIAL
Basophils Absolute: 0 10*3/uL (ref 0.0–0.1)
Eosinophils Absolute: 0.1 10*3/uL (ref 0.0–0.7)
Eosinophils Relative: 1 % (ref 0–5)
Monocytes Absolute: 0.7 10*3/uL (ref 0.1–1.0)

## 2010-06-08 LAB — POCT I-STAT, CHEM 8
BUN: 16 mg/dL (ref 6–23)
Creatinine, Ser: 0.9 mg/dL (ref 0.4–1.5)
Hemoglobin: 12.6 g/dL — ABNORMAL LOW (ref 13.0–17.0)
Potassium: 3.9 mEq/L (ref 3.5–5.1)
Sodium: 141 mEq/L (ref 135–145)
TCO2: 20 mmol/L (ref 0–100)

## 2010-06-08 LAB — POCT CARDIAC MARKERS: Troponin i, poc: <0.05 ng/mL (ref 0.00–0.09)

## 2010-06-08 LAB — CARDIAC PANEL(CRET KIN+CKTOT+MB+TROPI)
CK, MB: 6.9 ng/mL — ABNORMAL HIGH (ref 0.3–4.0)
Relative Index: 1.8 (ref 0.0–2.5)

## 2010-06-08 LAB — PROTIME-INR: Prothrombin Time: 15 seconds (ref 11.6–15.2)

## 2010-06-12 LAB — URINALYSIS, ROUTINE W REFLEX MICROSCOPIC
Glucose, UA: NEGATIVE mg/dL
Protein, ur: NEGATIVE mg/dL
Specific Gravity, Urine: 1.012 (ref 1.005–1.030)
Urobilinogen, UA: 0.2 mg/dL (ref 0.0–1.0)

## 2010-06-12 LAB — CBC
HCT: 30.3 % — ABNORMAL LOW (ref 39.0–52.0)
Hemoglobin: 10.1 g/dL — ABNORMAL LOW (ref 13.0–17.0)
MCHC: 33.6 g/dL (ref 30.0–36.0)
MCV: 93.3 fL (ref 78.0–100.0)
MCV: 95.8 fL (ref 78.0–100.0)
Platelets: 128 10*3/uL — ABNORMAL LOW (ref 150–400)
Platelets: 143 10*3/uL — ABNORMAL LOW (ref 150–400)
RBC: 3.16 MIL/uL — ABNORMAL LOW (ref 4.22–5.81)
RBC: 3.67 MIL/uL — ABNORMAL LOW (ref 4.22–5.81)
RDW: 14 % (ref 11.5–15.5)
WBC: 8.7 10*3/uL (ref 4.0–10.5)
WBC: 8.8 10*3/uL (ref 4.0–10.5)

## 2010-06-12 LAB — BASIC METABOLIC PANEL
BUN: 9 mg/dL (ref 6–23)
Chloride: 107 mEq/L (ref 96–112)
Creatinine, Ser: 1.1 mg/dL (ref 0.4–1.5)
GFR calc Af Amer: >60 mL/min (ref >60)
GFR calc Af Amer: >60 mL/min (ref >60)
GFR calc non Af Amer: >60 mL/min (ref >60)
Potassium: 4.1 mEq/L (ref 3.5–5.1)
Potassium: 4.2 mEq/L (ref 3.5–5.1)

## 2010-06-12 LAB — COMPREHENSIVE METABOLIC PANEL
ALT: 18 U/L (ref 0–53)
AST: 24 U/L (ref 0–37)
Albumin: 3.7 g/dL (ref 3.5–5.2)
Calcium: 9.5 mg/dL (ref 8.4–10.5)
Creatinine, Ser: 1.15 mg/dL (ref 0.4–1.5)
GFR calc Af Amer: >60 mL/min (ref >60)
GFR calc non Af Amer: >60 mL/min (ref >60)
Sodium: 144 mEq/L (ref 135–145)
Total Protein: 6.1 g/dL (ref 6.0–8.3)

## 2010-06-12 LAB — DIFFERENTIAL
Eosinophils Relative: 4 % (ref 0–5)
Lymphocytes Relative: 22 % (ref 12–46)
Lymphs Abs: 1.7 10*3/uL (ref 0.7–4.0)
Monocytes Absolute: 0.6 10*3/uL (ref 0.1–1.0)
Monocytes Relative: 8 % (ref 3–12)

## 2010-06-12 LAB — PROTIME-INR: Prothrombin Time: 16.1 seconds — ABNORMAL HIGH (ref 11.6–15.2)

## 2010-06-12 LAB — ABO/RH: ABO/RH(D): O POS

## 2010-06-12 LAB — CROSSMATCH
ABO/RH(D): O POS
Antibody Screen: NEGATIVE

## 2010-06-12 NOTE — Op Note (Signed)
  NAMETIBOR, LEMMONS              ACCOUNT NO.:  [id number]  MEDICAL RECORD NO.:  [id number]           PATIENT TYPE:  O  LOCATION:  WLEN                         FACILITY:  [facility]  PHYSICIAN:  Danise Edge, M.D.   DATE OF BIRTH:  [birth year]  DATE OF PROCEDURE:  06/08/2010 DATE OF DISCHARGE:                              OPERATIVE REPORT   OPERATION:  Diagnostic esophagogastroduodenoscopy.  HISTORY:  The patient is an 77 year old male, born 04/08/1926.  The patient is experiencing intermittent esophageal dysphagia.  He underwent a barium esophagram with barium tablet, which showed no obstruction, but did show thickened folds in the midesophagus.  The patient chronically takes aspirin and Plavix.  ENDOSCOPIST:  Danise Edge, M.D.  PREMEDICATION:  Fentanyl 75 mcg, Versed 7.5 mg.  PROCEDURE:  After obtaining informed consent, the patient was placed in the left lateral decubitus position.  The Pentax gastroscope was passed through the posterior hypopharynx into the proximal esophagus without difficulty.  The hypopharynx, larynx, and vocal cords appeared normal.  Esophagoscopy:  The proximal mid and lower segments of the esophageal mucosa appeared normal.  There is no endoscopic evidence for the presence of erosive esophagitis, Barrett esophagus, or esophageal obstruction.  Gastroscopy:  Retroflexed view of the gastric cardia and fundus was normal.  The gastric body and antrum appeared normal.  The pyloric mucosa is intensely red, but without ulceration.  The pylorus is patent.  Duodenoscopy:  The duodenal bulb and descending duodenum appeared normal.  Assessment:  Essentially normal esophagogastroduodenoscopy except for an intensely red pylorus, unassociated with ulceration.  Plan:  The patient chronically takes aspirin and Plavix.  I will place him on a small dose of generic Prevacid 15 mg before breakfast each morning to prevent gastrointestinal bleeding as a result of  aspirin and Plavix.          ______________________________ Danise Edge, M.D.     MJ/MEDQ  D:  06/08/2010  T:  06/09/2010  Job:  [id number]  Electronically Signed by Danise Edge M.D. on 06/11/2010 02:27:41 PM

## 2010-07-11 NOTE — Op Note (Signed)
[name], [name]              ACCOUNT NO.:  [id number]   MEDICAL RECORD NO.:  [id number]          PATIENT TYPE:  INP   LOCATION:  1540                         FACILITY:  [facility]   PHYSICIAN:  Danise Edge, M.D.   DATE OF BIRTH:  1926/07/11   DATE OF PROCEDURE:  10/22/2008  DATE OF DISCHARGE:                               OPERATIVE REPORT   PROCEDURE PERFORMED:  Diagnostic colonoscopy.   ENDOSCOPIST:  Danise Edge, M.D.   INDICATIONS:  The patient is an 77 year old male born 07-14-1926.  The patient developed painless gastrointestinal bleeding manifested by  the passage of melenic stool while taking Coumadin, aspirin and Plavix.  His Coumadin has been discontinued.  This morning his  esophagogastroduodenoscopy was normal.   The patient underwent right hip replacement surgery in January 2010.  He  has had three right hip dislocations and repeat right hip surgery is  planned.   The patient had drug-eluting coronary artery stents placed approximately  2 months ago and needs to remain on aspirin and Plavix for at least 1  year.   PREMEDICATION:  Fentanyl 75 mcg and Versed 6 mg.   DESCRIPTION OF THE PROCEDURE:  After obtaining informed consent the  patient was placed in the left lateral decubitus position.  I  administered intravenous fentanyl and intravenous Versed to achieve  conscious sedation for the procedure.  The patient's blood pressure,  oxygen saturation and cardiac rhythm were monitored throughout the  procedure and documented in the medical record.   Anal inspection revealed prominent external hemorrhoids.  Digital rectal  exam was normal.  The Pentax pediatric colonoscope was introduced into  the rectum and advanced to the cecum.  Colonic preparation for the exam  today was good.   The patient has universal colonic diverticulosis without signs of  gastrointestinal bleeding or the presence of melenic stool.  1. Rectum normal.  2. Sigmoid colon and  descending colon normal.  3. Splenic flexure normal.  4. Transverse colon normal.  5. Hepatic flexure normal.  6. Ascending colon normal.  7. Cecum and ileocecal valve normal.   The patient had undergone a segmental sigmoid colon resection for  diverticular disease in approximately 1987.   ASSESSMENT:  1. Universal colonic diverticulosis.  2. No signs of gastrointestinal bleeding and there is no melenic stool      in the colon.   RECOMMENDATIONS:  1. Transfuse 2 units of packed red blood cells tonight.  2. Continue aspirin and Plavix to prevent drug-eluting coronary stent      thrombosis.  3. Check serial hemoglobins tomorrow.  4. If hemoglobin is stable the patient can be discharged from the      hospital on Sunday or orthopedics can be consulted to schedule hip      replacement surgery during this hospitalization.           ______________________________  Danise Edge, M.D.     MJ/MEDQ  D:  10/22/2008  T:  10/23/2008  Job:  [id number]   cc:   Francisca December, M.D.  Fax: [phone]

## 2010-07-11 NOTE — Cardiovascular Report (Signed)
NAMEGRAVES, NIPP              ACCOUNT NO.:  [id number]   MEDICAL RECORD NO.:  [id number]          PATIENT TYPE:  OIB   LOCATION:  1961                         FACILITY:  [facility]   PHYSICIAN:  Francisca December, M.D.  DATE OF BIRTH:  24-Oct-1926   DATE OF PROCEDURE:  07/14/2008  DATE OF DISCHARGE:  07/14/2008                            CARDIAC CATHETERIZATION   PROCEDURES PERFORMED:  1. Right and left heart catheterization.  2. Left ventriculogram.  3. Coronary angiography.   INDICATIONS:  [name] is a 77 year old man who has presented  with worsening dyspnea.  Noninvasive evaluation is identified.  Significant mitral regurgitation on a 2-D echocardiogram and  transthoracic.  A Cardiolite perfusion study performed in January 2010,  showed no evidence of myocardial ischemia.  This was with pharmacologic  stress.  The echocardiogram demonstrated a focal prolapse in the  posterior leaflet with what was felt to be mildly severe mitral  regurgitation that was an acentric jet.  Because of these findings, the  patient was brought to the cardiac catheterization laboratory at this  time to fully identify the extent of the patient's valvular disease and  reassess for coronary artery disease.   PROCEDURE NOTE:  The patient was brought to the cardiac catheterization  laboratory in a fasting state.  The right groin was prepped and draped  in the usual sterile fashion.  Local anesthesia was obtained with  infiltration of 1% lidocaine.  A 4-French catheter sheath was inserted  percutaneously into the right femoral artery utilizing an anterior  approach over guiding J-wire.  In a similar fashion, a 6-French catheter  sheath was inserted percutaneously into the right femoral vein.  A 6-  [place] multipurpose A1 catheter was then advanced through the right  heart chambers to the pulmonary artery wedge position.  Pressure was  recorded from catheter to pulmonary wedge position, the main  pulmonary  artery, the right ventricle, and the right atrium.  Prior to withdrawal  of the multipurpose catheter from the main pulmonary artery, the pigtail  catheter was advanced to the ascending aorta where the aortic pressure  was recorded and then the catheter was prolapsed across the aortic valve  and simultaneous pressures were obtained from the pulmonary artery wedge  position and the left ventricle.  After removing the right heart  catheter and obtaining oxygen saturation samples from the superior vena  cava and the main pulmonary artery, a left ventriculogram was performed  in the 30-degree RAO angulation, 45 mL were injected at 13 mL/sec.  The  pigtail catheter was then removed and exchanged for a 4-French #4 left  Judkins catheter.  Cineangiography of the left coronary was conducted in  LAO and RAO projections.  The patient did receive 200 mcg of  intracoronary nitroglycerin after initiation of the coronary  angiography.  The left Judkins catheter was then exchanged for a 4-  [place] 3DRC right coronary artery catheter.  Cineangiography of the  right coronary was conducted in LAO and RAO projections.  At the  completion of the procedure, the catheter and catheter sheath were  removed.  Hemostasis  was achieved by direct pressure.  The patient was  transported to the recovery area in stable condition with an intact  pulse.   HEMODYNAMICS:  Utilizing the Fick principle and an estimated oxygen  consumption of 245 mL/min and calculated cardiac output was 3.8 L/min  and index 1.9 L/min/m2.  There was no transmitral gradient recorded.  Right heart pressures were as follows.  Right atrial pressure, A-wave 10  mmHg, V-wave 7 mmHg, and mean 6 mmHg.  Right ventricular pressure was  32/9 mmHg.  Pulmonary artery pressure was 34/10 with a mean of 21 mmHg.  Pulmonary capillary wedge pressure was 14 mmHg A-wave, 12 mmHg V-wave,  and 11 mmHg mean.  Central aortic pressure was 127/62 with a mean  of 90  mmHg.  There was no systolic gradient across the aortic valve.  The left  ventricular end-diastolic pressure was 17 mmHg preventriculogram.  It  should be noted that pulmonary capillary wedge pressure V-wave was not  elevated and was 13 mmHg.   ANGIOGRAPHY:  The left ventriculogram demonstrated normal chamber size  and normal global systolic function without significant regional wall  motion abnormality.  A visual estimate of the ejection fraction is 60%.  There is dense left and right coronary calcification seen.  There is 1+  mitral regurgitation seen only.  There is a small prolapse area seen in  the posterior leaflet.  The aortic valve is trileaflet and opens  normally during systole.   There is a right-dominant coronary artery system present.  The main left  coronary artery has no significant obstruction, but is heavily  calcified.  Left anterior descending artery is highly diseased; the  vessel is heavily calcified and there is a focal area of calcification  at the ostium with surrounding contrast.  It is difficult to estimate  the degree of stenosis, but it is certainly gradient 50%.  The proximal  portion of the left anterior descending artery is diffusely diseased and  tapers into the midportion where a bifurcation of the ongoing LAD and a  large diagonal.  There is a bifurcation lesion.  The ongoing anterior  descending artery at the bifurcation is 75-80% stenotic, the diagonal  branch again, which is large is 50-60% stenotic.  The ongoing anterior  descending artery at the junction of the mid and distal segments that  demonstrates a tubular 70% stenosis.  The diagonal branch itself after  its ostial stenosis has no significant disease.  It is second diagonal  branch.  There is a very small first diagonal branch.   The circumflex coronary artery and its branches are moderately diseased;  there are 2 small to moderate first and second marginal branches.  At  the origin  of the second marginal branch, there is a focal 30% stenosis.  The ongoing circumflex then gives rise to a large true obtuse marginal.   The right coronary artery and its vessels are minimally diseased; there  are luminal irregularities throughout its course and as mentioned as is  heavily calcified in the proximal segment.  In the distal segment, it  gives rise only to a posterior descending artery, which is moderate in  size.  There are no obstructions in the distal portion of right coronary  or in the posterior descending artery.   Collateral vessels are not seen.   IMPRESSION:  1. Atherosclerotic cardiovascular disease, severe single-vessel left      anterior descending artery.  2. Mild mitral regurgitation.  3. Intact left ventricular  size and global systolic function.  4. Normal right heart pressures.  5. No evidence of transmitral gradient.  6. Normal cardiac output.   It would appear that the patient's symptoms are not secondary to mitral  regurgitation.  The Doppler signal has overestimated the degree of  regurgitation.  This is certainly not performed by angiography here  today.   Percutaneous revascularization of the anterior descending artery would  be difficult and required advance techniques such as rotablation and  bifurcation stenting.  May also require ostial stenting as well.  Consideration will be given for referral to coronary bypass to  cardiovascular thoracic surgeons for single-vessel bypass.      Francisca December, M.D.  Electronically Signed     JHE/MEDQ  D:  07/14/2008  T:  07/14/2008  Job:  [id number]   cc:   Tasia Catchings, M.D.

## 2010-07-11 NOTE — H&P (Signed)
[name], [name]              ACCOUNT NO.:  [id number]   MEDICAL RECORD NO.:  [id number]          PATIENT TYPE:  INP   LOCATION:  0010                         [facility]ACILITY:  [facility]   PHYSICIAN:  [name], M.D.   DATE OF BIRTH:  May 08, 1926   DATE OF ADMISSION:  10/22/2008  DATE OF DISCHARGE:                              HISTORY & PHYSICAL   ADMISSION DIAGNOSES:  1. Gastrointestinal bleeding (passing melenic stool associated with      falling hemoglobin).  2. Normal esophagogastroduodenoscopy, October 22, 2008.   HISTORY:  [name] is an 77 year old male born 01/27/1927.  When the patient began passing melenic-appearing stool associated with  indigestion while taking aspirin, Plavix, and Coumadin, the patient was  instructed to discontinue Coumadin.   Two months ago, the patient had drug-eluting coronary stents placed.  In  2004, his screening colonoscopy was normal except for the presence of  universal colonic diverticulosis.   The patient has been off aspirin, Plavix, and Coumadin for 5 days but  continues to pass melenic stool.  He complains of intermittent dyspnea,  lightheadedness, and indigestion but denies chest pain or abdominal  pain.  He denies nausea, vomiting, or hematemesis.  There is no history  of peptic ulcer disease.  He uses Aleve on a p.r.n. basis.   Approximately 5 days ago, his hemoglobin was around 12 g.  Two days ago,  his hemoglobin was around 10 g.   MEDICATION ALLERGIES:  1. CODEINE.  2. FLOMAX.   CURRENT MEDICATIONS:  1. Aspirin 325 mg daily.  2. Tylenol Arthritis Pain 650 mg p.r.n.  3. Prevacid 30 mg each morning.  4. Aleve 220 mg as needed.  5. Valium 2.5 mg as needed.  6. Plavix 75 mg daily.  7. Crestor 20 mg daily.  8. Ambien 10 mg at bedtime.   PAST MEDICAL AND SURGICAL HISTORY:  1. Right-sided meningioma producing right 6th cranial nerve palsy,      followed at [facility].  2. Coronary artery  disease.  Drug-eluting coronary stents placed 2      months ago.  3. Gastroesophageal reflux.  4. Palpitations.  5. Hypercholesterolemia.  6. Depression.  7. Degenerative joint disease.  8. Bilateral knee replacements.  9. Left hip replacement.  10.Recent right hip replacement surgery, complicated by recurrent      dislocations of the prosthetic hip.  11.Three lower disk operations.  12.Insomnia.  13.Headaches.  14.Impotence.  15.Diverticulitis requiring sigmoid resection for stricture.  16.Universal colonic diverticulosis by 2004 colonoscopy.  17.Hemorrhoids.  18.Hearing loss.  19.Benign prostatic hypertrophy.  20.Appendectomy.  21.Bilateral inguinal hernia repairs.  22.TURP.  23.Sigmoid colon resection for diverticular stricture.  24.Skin nodules removed.  25.Rotator cuff tear on the right requiring surgery.  26.Ear surgery twice.  27.Cholecystectomy.  28.Bilateral lens implants.   HABITS:  The patient quit smoking cigarettes in 1965.  He consumes  alcohol nightly.  He is a retired Designer, industrial/product.   FAMILY HISTORY:  Father died, age 8, automobile accident.  Mother died,  73, coronary artery disease.  One brother died of  liver cancer.  One  sister died, Alzheimer disease.  Two sisters alive with severe  arthritis.  Two children alive and well.   GENERAL APPEARANCE:  The patient is alert and oriented.  He appears  comfortable.  Nonicteric sclera.  Normal oropharynx.  Pale palpebral  conjunctiva.  CARDIAC:  Reveals a regular rhythm without audible murmurs.  LUNGS:  Clear to auscultation.  ABDOMEN:  Soft and flat.  The patient is wearing a brace to prevent recurrent right hip  dislocation following recent hip replacement surgery.   PROCEDURE:  Diagnostic esophagogastroduodenoscopy.   PREMEDICATION:  1. Fentanyl 50 mcg.  2. Versed 7.5 mg.   PROCEDURE:  After obtaining informed consent, the patient was placed in  a left lateral decubitus position.  I  administered intravenous fentanyl  and intravenous Versed to achieve conscious sedation for the procedure.  The patient's blood pressure, oxygen saturation, and cardiac rhythm were  monitored throughout the procedure and documented in the medical record.   The Pentax gastroscope was passed through the posterior hypopharynx into  the proximal esophagus without difficulty.  The hypopharynx, larynx, and  vocal cords appeared normal.   ESOPHAGOSCOPY:  The proximal mid and lower segments of the esophageal  mucosa appeared normal.  The squamocolumnar junction and esophagogastric  junction are noted at approximately 40 cm from the incisor teeth.   GASTROSCOPY:  Retroflex view of the gastric cardia and fundus was  normal.  The gastric body, antrum, and pylorus appear normal.   DUODENOSCOPY:  The duodenal bulb, 2nd portion of duodenum, and 3rd  portion of duodenum appeared normal.   ASSESSMENT:  Gastrointestinal bleeding manifested by the passage of  melenic stool and falling hemoglobin of undetermined etiology.   PLAN:  The patient will consume a colonic lavage prep today and I will  perform a diagnostic colonoscopy this afternoon.           ______________________________  [name], M.D.     MJ/MEDQ  D:  10/22/2008  T:  10/22/2008  Job:  [id number]

## 2010-07-11 NOTE — Discharge Summary (Signed)
NAMECZAR, YSAGUIRRE NO.:  [id number]   MEDICAL RECORD NO.:  [id number]          PATIENT TYPE:  INP   LOCATION:  1228                         FACILITY:  [facility]   PHYSICIAN:  Tasia Catchings, M.D.   DATE OF BIRTH:  1926-04-09   DATE OF ADMISSION:  04/28/2008  DATE OF DISCHARGE:  04/29/2008                               DISCHARGE SUMMARY   DISCHARGE DIAGNOSES:  1. Acute dislocation of recent right hip arthroplasty.  2. Sinus bradycardia with possible atrial fibrillation.  3. Mild dysuria, probably not due to urinary tract infection.  4. Right-sided meningioma producing right sixth nerve palsy.  5. Nonocclusive coronary disease.  6. Atypical chest pain probably from reflux esophagitis.  7. Hypercholesterolemia.  8. Depression.  9. Degenerative joint disease.  10.Insomnia.  11.Headaches.  12.Impotence.  13.Diverticulosis, status post sigmoid stricture resection.  14.Gastroesophageal reflux disease.  15.Hemorrhoids.  16.Hearing loss.   DISCHARGE MEDICATIONS:  1. Aspirin 81 mg once a day.  2. Valium 5 mg p.r.n.  3. Arthritis Strength Tylenol p.r.n.  4. Prilosec OTC one 20 mg tablet q. a.m.  5. Aleve p.r.n.  6. Multivitamins p.r.n.  7. Aloe p.r.n.   ACTIVITY:  The patient is to wear a brace and be followed up by Dr.  Norlene Campbell of orthopedic surgery.  He is also to see me in 1 week.   CONDITION:  Improved.   DIET:  Regular.  No added salt.   FOLLOW UP:  As above.   BRIEF HISTORY:  Mr. Tutterow was shaving, twisted in an unusual way and  dislocated his right hip with severe pain.  He was seen in the emergency  room and x-rays confirmed that, but his EKG revealed bradycardia which  was either sinus or possibly junctional.  He was cleared for surgery and  underwent a reduction of his hip and the following morning when I saw  him, he was back in sinus rhythm rate of around 66.  Physical exam at  time of admission was unremarkable except for his hip.   Laboratory  studies included normal electrolytes, thyroid study.  Normal CBC.  However, he did have an elevated CK-MB with normal troponins consistent  with his recent hip replacement and his dislocation.   HOSPITAL COURSE:  The patient was seen by the cardiologist in our  practice who felt that this was a reaction to his medicines and that he  needed a Holter monitor as an outpatient, but that he did not require  further hospitalization, and this was also the case for the orthopedist  who will follow him as an outpatient.      Tasia Catchings, M.D.  Electronically Signed     JW/MEDQ  D:  04/29/2008  T:  04/29/2008  Job:  [id number]   cc:   Claude Manges. Cleophas Dunker, M.D.  Fax: [phone]

## 2010-07-11 NOTE — Op Note (Signed)
[name], [name]              ACCOUNT NO.:  [id number]   MEDICAL RECORD NO.:  [id number]          PATIENT TYPE:  EMS   LOCATION:  MAJO                         FACILITY:  [facility]   PHYSICIAN:  John L. Rendall, M.D.  DATE OF BIRTH:  January 28, 1927   DATE OF PROCEDURE:  10/17/2008  DATE OF DISCHARGE:                               OPERATIVE REPORT   PREOPERATIVE DIAGNOSIS:  Posterior superior dislocation, right total  hip.   SURGICAL PROCEDURE:  Closed manipulation and reduction under anesthesia.   POSTOPERATIVE DIAGNOSIS:  Posterior superior dislocation, right total  hip.   SURGEON:  John L. Rendall, MD   ASSISTANT:  Legrand Pitts. Duffy, PAC   ANESTHESIA:  General.   PATHOLOGY:  The patient has popped out his right total hip now for the  third time.  Revision is not possible yet due to coronary stents  recently placed in June 2010.   PROCEDURE IN DETAIL:  Under general anesthetic with assistance to hold  them stable, a longitudinal traction was applied to his abducted and  internally rotated leg.  It was brought out into full extension and then  external rotation was performed.  The hip popped back into place  relatively easily with good muscle relaxation on board.  C-arm pictures  confirmed reduction.  No evidence of occult fracture.  The patient is  placed with abduction pillows waiting for an application of a pelvic  band with hip hinge and thigh lacer.      John L. Rendall, M.D.  Electronically Signed     JLR/MEDQ  D:  10/17/2008  T:  10/18/2008  Job:  [id number]

## 2010-07-11 NOTE — Discharge Summary (Signed)
[name], [name]              ACCOUNT NO.:  [id number]   MEDICAL RECORD NO.:  [id number]          PATIENT TYPE:  INP   LOCATION:  1608                         FACILITY:  [facility]   PHYSICIAN:  John L. Rendall, M.D.  DATE OF BIRTH:  [birth year]   DATE OF ADMISSION:  03/25/2008  DATE OF DISCHARGE:  03/27/2008                               DISCHARGE SUMMARY   ADMISSION DIAGNOSES:  1. End-stage osteoarthritis right hip.  2. Hard of hearing.  3. Diverticulosis.  4. History of irregular heart rate.  5. History of coronary artery disease with history of myocardial      infarction in 1980.  6. Right side meningioma.  7. Sleep apnea.   DISCHARGE DIAGNOSES:  1. End-stage osteoarthritis right hip status post right total hip      arthroplasty.  2. Acute blood loss anemia secondary to surgery.  3. Heart of hearing.  4. Diverticulosis.  5. History of irregular heartbeat.  6. Coronary artery disease with history of myocardial infarction in      the 80s.  7. Right side meningioma with chronic sixth nerve palsy.  8. Sleep apnea.   SURGICAL PROCEDURE:  On March 25, 2008 [name] underwent a right  total hip arthroplasty by Dr. Jonny Ruiz L.  Rendall, assisted by Arnoldo Morale,  PA-C.  He had a Pinnacle 100 Series acetabular cup size 56 mm placed  with an apex hole eliminator and a Pinnacle Marathon acetabular liner  plus four 10 degree 32 mm inner diameter, 56 mm outer diameter.  An AML  large stature 15 mm size femoral stem with an __________ femoral head 32  mm +1 neck length 12/14 taper.   COMPLICATIONS:  None.   CONSULTS:  1. Physical therapy and case management consult March 26, 2008.  2. Occupational therapy consult March 26, 2008.   HISTORY OF PRESENT ILLNESS:  This 77 year old white male patient  presented to Dr. Priscille Kluver with a history of bilateral total knees and a  left total hip by him and now a 37-month history of gradual onset  progressive right hip pain over the last 6  weeks.  The pain is now  constant with weightbearing as a throb over the right buttock with  radiation to the knee.  It increases with range of motion and work and  decreases with lying down.  He has chronic back pain.  Cortisone did not  provide any relief and x-rays show end-stage arthritic changes of the  hip.  Because of this he is presenting for a right hip replacement.   HOSPITAL COURSE:  [name] tolerated his surgical procedure well  without immediate postoperative complications.  He was transferred to  the orthopedic floor.  On postoperative day #1 he was afebrile, vitals  were stable, hemoglobin 11.6, hematocrit 34.4.  He was started on  therapy per protocol.  He did have some nausea that was treated with  medications effectively.  Hemoglobin was 11.6, hematocrit 34.3,  potassium 4.2.   Postoperative day #2 he was feeling better.  Nausea had resolved, he was  afebrile, vitals stable, hemoglobin 10.1, hematocrit 30.3.  Incision was  well-approximated with staples.  He was able to be transitioned to p.o.  pain medications and he did well enough with therapy that he was able to  be discharged home later that day.   DISCHARGE INSTRUCTIONS:   DIET:  He is to resume his regular prehospitalization diet.   MEDICATIONS:  He is to resume his home medications as follows:  1. Prilosec 20 mg one p.o. daily.  2. Valium 5 mg p.o. q.4 hours p.r.n.  3. Multivitamin 1 tablet p.o. daily.  4. Alotin HA 2 tablets p.o. b.i.d., he is to use at this time.  5. Tramadol 1 tablet p.o. q.i.d. p.r.n. pain, he is not to use at this      time.  6. Metamucil 2 tablets p.o. b.i.d.  7. Aspirin 81 mg one p.o. q.a.m., he is to resume once he has      completed his Arixtra.   Additional medications at this time include:  1. Arixtra 2.5 mg subcutaneous q. 8 p.m. with the last dose on      February 6.  On the 7th he is to resume his daily aspirin.  2. Celebrex 200 mg one tablet p.o. b.i.d. for 1 week and  then 1 tablet      p.o. daily, 30 with no refill.  3. Percocet 5/325 one to two p.o. q.4 - 6 hours p.r.n. for pain.  4. Robaxin 500 mg one tablet p.o. q.6 hours p.r.n. for spasms.   ACTIVITY:  He can be out of bed weightbearing as tolerated on the right  leg with the use of walker.  No lifting or driving for 6 weeks.  He is  to increase activity slowly and have home health per [facility].  Please see the blue total hip discharge sheet for further  activity instructions.   WOUND CARE:  He may shower after no drainage from the wound for 2 days.  Please see the blue total hip discharge sheet for further wound care  instructions.   FOLLOWUP:  He is to follow up with Dr. Priscille Kluver in our office on Tuesday,  February 9, he needs to call [phone] for that appointment.   LABORATORY DATA:  Hemoglobin/hematocrit ranged from 15.1 and 44.9 on the  twenty-fifth to 10.1 and 30.3 on the thirtieth.  Platelets went from 143  on the twenty-fifth to 105 on the thirtieth.   PT and INR were 16.1 and 1.3 on the twenty-fifth with a PTT of 39.   Glucose ranged from 185 on the twenty-fifth to 114 on the thirtieth.  Calcium dropped to a low of 8.1 on the twenty-ninth and went back up to  8.5.  All other laboratory studies were within normal limits.   Chest x-ray done on January 25 showed stable mild chronic interstitial  coarsening without acute cardiopulmonary process.  X-ray taken of the  right hip on January 28 showed the new right total hip replacement  appears to be on portable views of the pelvis and also on the cross-  table lateral of the pelvis.      Legrand Pitts Duffy, P.A.      John L. Rendall, M.D.  Electronically Signed    KED/MEDQ  D:  04/22/2008  T:  04/22/2008  Job:  [id number]

## 2010-07-11 NOTE — Consult Note (Signed)
NAMESTEVIE, [name]              ACCOUNT NO.:  [id number]   MEDICAL RECORD NO.:  [id number]          PATIENT TYPE:  INP   LOCATION:  1228                         FACILITY:  [facility]   PHYSICIAN:  Francisca December, M.D.  DATE OF BIRTH:  November 16, 1926   DATE OF CONSULTATION:  DATE OF DISCHARGE:  04/29/2008                                 CONSULTATION   REASON FOR CONSULTATION:  Bradycardia.   HISTORY OF PRESENT ILLNESS:  [name] is a 77 year old male patient  with a history of nonobstructive coronary artery disease by  catheterization in 2007, who underwent right total hip replacement in  January 2010.  Yesterday, he had a dislocation of the right hip and had  to go to the operating room last night.  On admission, his EKG showed  sinus bradycardia at rate of 47.  He explains to me, at that time he was  in extreme pain.  Since he has been on the monitor in the intensive  care unit, he has had no significant bradycardia.   The patient does complain of some fatigue and intermittent dizziness,  but more so fatigue over the past several months even before his initial  hip surgery.  He denies any syncope or palpitations.  He has been seen  at [facility] in the past and Dr. Julieanne Manson do perform the cardiac catheterization back in 2007, which showed  LAD with a 30% ostial, 50% proximal, and a 40% mid lesion.  Circumflex  with a 30% mid stenosis.  Right coronary artery was dominant with ostial  calcifications, but otherwise no high-grade stenosis.  EF was mildly  depressed at 45-50% with mild global hypokinesis.  Just prior to his  surgery in January 2010, he had a Cardiolite at [facility] that was negative.   PAST MEDICAL HISTORY:  Osteoarthritis, nonobstructive coronary artery  disease, hyperlipidemia, GERD, diverticulosis, bilateral total knee  replacement status post appendectomy, status post cholecystectomy,  status post  bilateral inguinal hernia repairs, and reports history of  emphysema.   SOCIAL HISTORY:  He lives in [place] with his wife.  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  He denies any tobacco or illicit drug use.  He does drink 1  alcoholic drink per night.   FAMILY HISTORY:  He states that both his parents had heart disease from  what he could tell.   REVIEW OF SYSTEMS:  As above, otherwise negative.   ALLERGIES:  CODEINE.   MEDICATIONS:  Currently Protonix, Flomax, Zofran, and Ultram.  His list  of medications on admission include tramadol 50 mg q.i.d., Valium  p.r.n., Flomax, Celebrex, and Robaxin p.r.n.   PHYSICAL EXAMINATION:  VITAL SIGNS:  Temperature 98, pulse 58,  respirations 16, blood pressure 126/67, and O2 saturations 92% on room  air.  GENERAL:  He is in no acute distress.  HEENT:  Grossly normal.  Sclerae clear.  Conjunctivae normal.  Nares  without drainage.  NECK:  No carotid or subclavian bruits.  No  JVD or thyromegaly.  CHEST:  Clear to auscultation bilaterally.  No wheezing or rhonchi.  Heart regular rate and rhythm.  No evidence of murmur, rub, or ectopy.  ABDOMEN:  Good bowel sounds.  Nontender and nondistended.  No masses or  bruits.  LOWER EXTREMITIES:  No peripheral edema.  SKIN:  Warm and dry.  NEUROLOGIC:  Cranial Nerves II through XII are grossly intact.  Normal  mood and affect.  MUSCULOSKELETAL:  He is wearing a abdominal/pelvic brace that extends  into the right leg.   CHEST X-RAY:  The last chest x-ray was in January 2010 that showed mild  chronic interstitial coarsening with an acute process.   LABORATORY STUDIES:  Hemoglobin 12.5, hematocrit 37.3, and platelets  113.  White count 9.8, sodium 137, potassium 3.8, BUN 16, and creatinine  0.91.  Point-of-care markers show myoglobin of 361 and troponin less  than 0.05.  CK-MB 378/6.9 with a relative index of 1.8, troponin less  than 0.01,  free T4 0.93, TSH 1.449.  EKG shows sinus bradycardia at rate  of 47 with nonspecific ST-T wave changes in V5-V6, which is unchanged  from EKG done at [facility] on March 22, 2008.   ASSESSMENT AND PLAN:  1. Sinus bradycardia.  2. Right hip displacement as described above.  3. Osteoarthritis.  4. Nonobstructive coronary artery disease.  5. History of hyperlipidemia.  6. Gastroesophageal reflux disease.  7. Diverticulitis.  8. Bilateral total knee replacements.  9. History of appendectomy.  10.History of cholecystectomy.  11.History of bilateral inguinal hernia repair.  12.Allergy to CODEINE.  13.Long-term medication use.   The patient has a history of fatigue and some intermittent dizziness in  the past several months.  He thinks this could be secondary to his  recent inactivity since January.  His symptoms could also be related to  significant bradycardia.  He has not really had any significant  bradycardia and since being here, but I have arranged a 24-hour  continuous monitoring system for him over the next several weeks.  This  will be sent to his house.  He is okay to go home from a cardiovascular  standpoint.  I have this followup arranged by Dr. Amil Amen on June 03, 2008 at 10:15 a.m. and at that time we can see if his symptoms have  regressed and can go over the monitor with him.      Guy Franco, P.A.      Francisca December, M.D.  Electronically Signed   LB/MEDQ  D:  04/29/2008  T:  04/30/2008  Job:  [id number]   cc:   Tasia Catchings, M.D.  Francisca December, M.D.

## 2010-07-11 NOTE — Consult Note (Signed)
Nathan, [name] NO.:  [id number]   MEDICAL RECORD NO.:  [id number]          PATIENT TYPE:  EMS   LOCATION:  MAJO                         FACILITY:  [facility]   PHYSICIAN:  Francisca December, M.D.  DATE OF BIRTH:  [birth year]   DATE OF CONSULTATION:  10/17/2008  DATE OF DISCHARGE:                                 CONSULTATION   REQUESTING PHYSICIAN:  April Palumbo-Rasch, MD   REASON FOR CONSULTATION:  Preop cardiac risk assessment.   HISTORY OF PRESENT ILLNESS:  [name] is a pleasant 77 year old man  well-known to me after an extensive cardiac evaluation over the last 4-5  months.  He initially presented with exertional dyspnea and underwent a  myocardial perfusion study which showed a reversible anterior defect.  Cardiac catheterization was performed demonstrating a complex  bifurcation lesion in the mid to distal LAD with a large diagonal.  He  underwent percutaneous revascularization including Rotablator-assisted  drug-eluting stent implantation in the mid LAD.  There was some concern  for diagonal branch stent jail.  Unfortunately, he did not improve  significantly after this procedure and continued to complain of minimal  exertional dyspnea, but fair degree of fatigue and lack of energy.  Evaluation prior to the stent implantation did identify some degree of  mitral regurgitation, initially felt by echocardiography to be in the  moderate-to-severe range but by cardiac catheterization felt only to be  mild.  Therefore, this was not pursued.  Finally, because of complaints  of palpitation, a 48-hour Holter was obtained and on 2 occasions  demonstrated the presence of paroxysmal atrial fibrillation with tachy-  brady syndrome.  On September 30, 2008, he was initiated on systemic  anticoagulation with warfarin.  The plan was to run his INR in the 1.5-2  range due to the presence of Plavix and aspirin.  However, when being  followed up in the office for his PT/INR  on October 11, 2008, he  complained of dark stools after a spell of diarrhea.  He was seen by  Internal Medicine and guaiac-positive stools were documented.  Coumadin  was discontinued.   Today, his family member called to inform me that his hip had dislocated  once again.  This has been a chronic problem.  He was being transported  to the emergency room.  At the time of my evaluation, he is laying on  his left side complaining of pain in the 8/10 range.  He denies any  ongoing dyspnea or chest discomfort.  He is tearful.  He states that he  still has dark stools but thinks that the bleeding has resolved for some  reason.  He states it has been 3-4 days since he has had his Coumadin.   PAST MEDICAL HISTORY:  1. Right-sided meningioma producing a right sixth nerve facial palsy.  2. Esophagitis.  3. Hypercholesterolemia.  4. Depression.  5. DJD, status post bilateral knee replacements and right hip      replacement.  6. Right prosthetic knee derangement, in need of surgical repair.  7. Low back pain status post disk repair.  8. Insomnia.  9. Headaches.  10.Erectile dysfunction, status post pump implant.  11.History of diverticulitis.  12.Hemorrhoids.  13.GERD.  14.Hearing loss.  15.BPH.  16.ASCVD, single-vessel as described above.   CURRENT MEDICATIONS:  1. Aspirin 81 mg p.o. daily.  2. Tylenol Arthritis 650 mg one p.o. t.i.d. p.r.n.  3. Prilosec OTC 20 mg p.o. daily.  4. Aleve 220 mg p.o. daily, currently on hold.  5. Valium 5 mg one-half tablet t.i.d. p.r.n.  6. Plavix 75 mg p.o. daily.  7. Warfarin 5 mg p.o. daily.  8. Nexium 40 mg p.o. daily.  9. Crestor 20 mg p.o. daily.   DRUG ALLERGIES:  CODEINE and FLOMAX.   SOCIAL HISTORY:  He is accompanied by his son here in the emergency  room.  He quit smoking in 1965.  Occasional alcohol use.  Three cups of  coffee daily.  He is a retired Designer, industrial/product.  He has been married  twice and a native of [place].    REVIEW OF SYSTEMS:  He denies any cough or hemoptysis.  No hematemesis  or hematochezia.  The stool remained dark as mentioned.  He has no  dysuria, hematuria, or nocturia.  He has severe right hip pain and  chronic right knee pain which intermittently locks.  He has no skin  rashes.  He has no headache or diplopia.  He denies any problems with  swallowing.  No sore throat.  He does bruise easily on his  anticoagulants and Plavix.  He has not had any abnormal bleeding except  as mentioned in the GI tract.  He has no dysesthesia or paresthesia.  No  muscle weakness.   PHYSICAL EXAMINATION:  VITAL SIGNS:  Blood pressure is 143/67, pulse 64  and regular, respiratory rate 16, temperature currently pending, O2  saturation on room air 100%.  GENERAL:  This an 77 year old Caucasian male lying on his left side with  the right leg extended, moderate distress secondary to pain.  HEENT:  Unremarkable.  Head is atraumatic and normocephalic.  Pupils are  equal, round, and reactive to light.  Sclerae are anicteric.  Oral  mucosa is pink and moist.  Teeth and gums in good repair.  Tongue is not  coated.  The neck is supple without thyromegaly or masses.  JVD cannot  be assessed.  CHEST:  Clear with adequate excursion.  No wheezes, rales, or rhonchi.  HEART:  Regular rhythm.  Normal S1 and S2 is heard.  A soft holosystolic  murmur of MR is present at the apex.  ABDOMEN:  Soft, nontender.  No midline pulsatile mass.  Bowel sounds  present in all quadrants.  EXTERNAL GENITALIA:  Without lesions.  RECTAL:  Showed dark stool and the guaiac is pending.  EXTREMITIES:  Diminished range of motion of the right lower extremity.  Intact range of motion of the left lower extremity and upper  extremities.  No muscle weakness.  No edema and intact distal pulses.  NEUROLOGICAL:  Nonfocal.  SKIN:  Warm, dry, and clear.   IMPRESSION:  1. Recent gastrointestinal bleed secondary to a combination of Plavix,       aspirin, and warfarin.  Also in the setting of known diverticulitis      and sigmoid resection for stricture.  2. Known tachy-brady syndrome by Holter monitor.  3. Atherosclerotic cardiovascular disease, status post drug-eluting      stent implantation, August 03, 2008, left anterior descending.  4. Intact  left ventricular systolic function.  5. Moderate mitral  regurgitation.  6. Persistent fatigue of unknown etiology.  7. Prosthetic right hip and right knee derangement.  8. Depression and insomnia.  He has been very upset and unable to      sleep since his diagnosis and initiation of treatment for atrial      fibrillation.   PLAN:  1. At this point, the patient is no longer a candidate for warfarin,      especially with a relatively low CHADS score and evidence of GI      bleeding.  2. Aspirin and Plavix certainly complicate the need for prosthetic      knee and hip surgery.  I was hoping to maintain him at least 6      months prior to discontinuation for this operation.  3. He clearly will need to confirm that the GI bleeding has resolved      prior to any invasive therapy.  4. Other than dealing with his antiplatelet agents, there is no      cardiac contraindication to general anesthesia or hip/knee surgery.  5. Currently awaiting labs and result of stool guaiacs.  6. I will be happy to follow along with you.  If necessary, we can      allow for some Plavix washout and then initiate a heparin bridge      prior to and after his operation.      Francisca December, M.D.     JHE/MEDQ  D:  10/17/2008  T:  10/18/2008  Job:  [id number]   cc:   Loreta Ave, M.D.  Theressa Millard, M.D.

## 2010-07-11 NOTE — Op Note (Signed)
[name], [name]              ACCOUNT NO.:  [id number]   MEDICAL RECORD NO.:  [id number]          PATIENT TYPE:  INP   LOCATION:  0001                         [facility]ACILITY[facility]  [facility]   PHYSICIAN:  John L. Rendall, M.D.  DATE OF BIRTH:  [birth year]   DATE OF PROCEDURE:  03/25/2008  DATE OF DISCHARGE:                               OPERATIVE REPORT   PREOPERATIVE DIAGNOSIS:  Osteoarthritis right hip.   SURGICAL PROCEDURE:  Right AML total hip replacement.   POSTOPERATIVE DIAGNOSIS:  Osteoarthritis right hip.   SURGEON:  John L. Rendall, M.D.   ASSISTANT:  Legrand Pitts. Duffy, P.A.   ANESTHESIA:  General.   PATHOLOGY:  The patient has shown progressive narrowing of the articular  surface of the hip on x-ray and has pain with any motion despite  conservative measures.  The superior femoral head showed advanced wear  and all consistent with straightforward simple osteoarthritis.   PROCEDURE IN DETAIL:  Under general anesthesia the patient is placed in  the left lateral decubitus position.  The hip was prepared with DuraPrep  and draped as a sterile field.  Posterior approach is made splitting the  IT band and the gluteus maximus in the line of its fibers.  The short  medium Charnley is inserted.  The short external rotators and hip  capsule are taken down and the hip is dislocated.  The superior femoral  neck is exposed.  The IM initiator and canal finders were used.  From  the other side we know he has a 15 mm canal and progressive reaming to  14.5 reveals a good chatter fit at that level.  The femoral neck is then  osteotomized and rasps are used.  He fits the large rasp well and at  13.5 large a calcar reamer is used and an excellent fit is obtained.  The 15 seats nicely.   Attention was then turned to the acetabulum.  Two cobras are placed  inferiorly.  The interval between the labrum and the capsule is  developed superiorly and two wing retractors are placed under the  capsule, one  large, one medium, keeping all other structures  posteriorly.  The labrum is then excised.  Ligamentum teres is excised.  The long handled Cobb was then used to remove other fibrous tissue at  the rim of the acetabulum.  Progressive reaming was then done 45, 47,  49, 51, 53, 54, 55 and 56 is chosen as the size for the acetabular  component.  Trial of a 54 acetabular dummy placed in the acetabulum  bottoms out nicely without any problems.  The Pinnacle 100 series 56  acetabular component is then inserted using the sputnik to assist in  positioning.  The permanent component is then seated.  A trial poly  acetabulum for a 32 hip ball, +4 liner, 10 mm lip is inserted.  The rasp  was then inserted.  A standard neck length was then used with a +1.5  neck length, 32 hip ball.  This reveals excellent fit, alignment and  stability through all normal range of motion.  No evidence of  impingement about the acetabulum.  The leg lengths appear identical.  At  this point the remainder of the permanent components are opened.  The  apex hole eliminator is placed in the acetabulum.  The Marathon poly is  then inserted for the 56 cup, 32 mm hip ball, +4 with a 10 degree lip.  The 15 large AML stem with a +1.5 hip ball is used.  With all components  seated nicely with the scratch fit of about 6 cm on insertion of the  femoral component the hip was stable through full normal range of  motion.  At this point the hip capsule where it had been teed was closed  with #1 Tycron two sutures, piriformis was reattached, short external  rotators were reattached with two stitches.  The IT band was then closed  with #1 Tycron, subcu with #1 Vicryl, 2-0 Vicryl and skin clips.  Blood  loss was a relatively surprising 900 mL, nearly two-thirds of this  probably came from acetabular bone bleeding which we attempted to  control during the reaming.  There were very few other bleeders in the  case.  No blood was required.  The  patient was stable through the  procedure.  He returned to recovery in good condition.      John L. Rendall, M.D.  Electronically Signed     JLR/MEDQ  D:  03/25/2008  T:  03/25/2008  Job:  [id number]

## 2010-07-11 NOTE — H&P (Signed)
[name], [name]              ACCOUNT NO.:  [id number]   MEDICAL RECORD NO.:  [id number]          PATIENT TYPE:  INP   LOCATION:  0098                         FACILITY:  [facility]   PHYSICIAN:  Hollice Espy, M.D.DATE OF BIRTH:  1926-08-21   DATE OF ADMISSION:  04/28/2008  DATE OF DISCHARGE:                              HISTORY & PHYSICAL   PRIMARY CARE PHYSICIAN:  Tasia Catchings, M.D.   CONSULTANTS ON THE CASE:  Claude Manges. Cleophas Dunker, M.D. of orthopedic  surgery.   CHIEF COMPLAINT:  Hip pop.   HISTORY OF PRESENT ILLNESS:  The patient is a 77 year old white male who  is status post a right total hip arthroplasty approximately 5 weeks ago  when today he was in his usual state of health and, all the sudden while  shaving, noted a pop on his right side.  He all the sudden started  having some severe pain.  He was brought into [facility] ER and found  to have a superior/posterior dislocation of the right hip.  He also was  noted to be bradycardic with a heart rate running anywhere from the 40s  to 50s.  It appeared to be an arrhythmia, question of sinus versus  atrial fibrillation as borderline P-waves could be seen.  The patient  himself tells me that he never had any history of slow heart rate as far  as he knows.  He has not had any problems with lightheadedness or  dizziness prior to his hip issue or in the past.  He otherwise is doing  okay after receiving a pain shot.  He denies headaches, vision changes,  dysphagia, chest pain, palpitations, shortness of breath, wheeze, cough,  abdominal pain, hematuria, dysuria, constipation, diarrhea, focal  extremity numbness, weakness or pain currently.   REVIEW OF SYSTEMS:  Otherwise negative.   PAST MEDICAL HISTORY:  1. GERD.  2. History of CAD with a cardiac catheterization showing mild vessel      disease in multiple vessels back in 2007.  3. History of emphysema.  4. Distant history of tobacco abuse.  5. Status post  laparoscopic cholecystectomy.  6. Left THA done October 2006, right THA done several weeks ago.  7. History of cataracts.   MEDICATIONS:  1. He is on tramadol 50 four times a day.  2. He takes Valium he says p.r.n., last one was taken about several      weeks ago.  3. Flomax 0.4.  4. Celebrex 200.  5. Robaxin 500 p.r.n.   ALLERGIES:  He has allergy to CODEINE.   SOCIAL HISTORY:  Denies any current tobacco, alcohol or drug use.   FAMILY HISTORY:  Noncontributory.   PHYSICAL EXAMINATION:  VITALS:  On admission, temperature 97.6, heart  rate 61, but when I saw him, it was down into the 50s.  It has been  documented down to as low as 45.  Respirations 18.  His blood pressure  has been stable at 120/66, O2 saturations 99% on 2 liters.  GENERAL:  He is alert and oriented x3, no apparent distress.  HEENT: Normocephalic atraumatic.  His mucous  membranes are moist.  He  has no carotid bruits.  HEART:  An irregular rhythm, bradycardiac.  Unable to appreciate at this  time any kind of murmur.  LUNGS:  Clear to auscultation bilaterally.  Somewhat limited secondary  to body habitus.  ABDOMEN:  Soft, obese, nontender, positive bowel sounds.  EXTREMITIES:  Show no clubbing, cyanosis.  Trace pitting edema.  MUSCULOSKELETAL:  I have deferred musculoskeletal exam secondary to the  patient's dislocation.   LAB WORK:  EKG notes a sinus arrhythmia, although again this possibly  could be more atrial fibrillation.  It is argumentative whether or not  there are borderline P-waves at best.   Urinalysis notes clean.  Sodium 141, potassium 3.9, chloride 111, BUN  16, creatinine 0.9.  White count 9.8, H and H 12.5 and 37, 81% shift,  platelet count 113.  CPK 361 but MB and troponin are normal at 1.3 and  troponin I less than 0.05.  elevated CPK is consistent with injury.  INR  is 1.1, bicarb is 21.   ASSESSMENT/PLAN:  1. Hip dislocation.  Discussed with orthopedic surgery.  While he has      active  acute cardiac issues that need to be worked up and would      warrant a medicine admission, feel that he is stable to go to the      operating room for his dislocation.  2. Sinus bradycardia.  No previous history.  We will check a thyroid      study, serial markers, keep on telemetry, p.r.n. atropine and ask      his cardiologist, Dr. Caprice Kluver, to come to consult in the morning.      Would in the meantime defer to anesthesia in terms of limiting how      much sedation he gets, p.r.n. atropine for his heart rate falling      below 45 sustained and limit also postoperative narcotics.      Hollice Espy, M.D.  Electronically Signed     SKK/MEDQ  D:  04/28/2008  T:  04/28/2008  Job:  [id number]   cc:   Tasia Catchings, M.D.  Fax: [phone]   Claude Manges. Cleophas Dunker, M.D.  Fax: [phone]   Thereasa Solo. Little, M.D.  Fax: [phone]

## 2010-07-11 NOTE — Op Note (Signed)
NAMESHERI, PROWS NO.:  [id number]   MEDICAL RECORD NO.:  [id number]          PATIENT TYPE:  INP   LOCATION:  1228                         FACILITY:  [facility]   PHYSICIAN:  Claude Manges. Whitfield, M.D.DATE OF BIRTH:  August 13, 1926   DATE OF PROCEDURE:  04/28/2008  DATE OF DISCHARGE:                               OPERATIVE REPORT   PREOPERATIVE DIAGNOSIS:  Posterior dislocation right total hip  replacement.   POSTOPERATIVE DIAGNOSIS:  Posterior dislocation right total hip  replacement.   PROCEDURE:  Closed reduction of right total hip replacement.   SURGEON:  Dr. Cleophas Dunker.   ASSISTANT:  Oris Drone. Petrarca, P.A.-C.   ANESTHESIA:  General mask.   COMPLICATIONS:  None.   DESCRIPTION OF PROCEDURE:  Mr. Levene was brought to the operating room  and then placed on the operating room table.  His hip was adducted and  internally rotated.  We obtained image intensification visualization of  his hip that revealed the femoral component to be superior and posterior  to the acetabulum.   Manipulation was performed with flexion of his leg and then distal  traction at which point we had an audible reduction.  We checked the hip  for stability and with adduction and internal rotation he was unstable.  He was perfectly stable even with flexion and abduction.   We again checked image intensification and there was no evidence of a  fracture or problem with the components.   An abduction pillow was then applied and the patient was then  transferred back to the operating room stretcher and taken to the post  anesthesia recovery room in satisfactory condition.      Claude Manges. Cleophas Dunker, M.D.  Electronically Signed     PWW/MEDQ  D:  04/28/2008  T:  04/29/2008  Job:  [id number]

## 2010-07-11 NOTE — Cardiovascular Report (Signed)
[name]              ACCOUNT NO.:  [id number]   MEDICAL RECORD NO.:  [id number]          PATIENT TYPE:  INP   LOCATION:  2503                         FACILITY:  [facility]   PHYSICIAN:  [name], M.D.  DATE OF BIRTH:  1926/09/29   DATE OF PROCEDURE:  08/03/2008  DATE OF DISCHARGE:                            CARDIAC CATHETERIZATION   PROCEDURES PERFORMED:  1. Percutaneous coronary intervention/drug-eluting stent implantation      distal and mid left anterior descending artery.  2. Adjunctive Rotablator.  3. Percutaneous closure right femoral artery.   INDICATIONS:  [name] is a 77 year old man with severe exertional  dyspnea and single vessel coronary disease identified by recent coronary  angiography.  The vessel is heavily calcified and there are two lesions  in the mid and distal portions of the artery.  He is to undergo catheter-  based revascularization with adjunctive Rotablator at this time due to  the degree of calcification.   PROCEDURE NOTE:  The patient was brought to the Cardiac Catheterization  Laboratory in a fasting state.  The right groin was prepped and draped  in the usual sterile fashion.  Local anesthesia was obtained with  infiltration of 1% lidocaine.  An 8-French catheter sheath was inserted  percutaneously into the right femoral artery utilizing an anterior  approach over a guiding J-wire.  In a similar fashion, a 5-French  catheter sheath was inserted percutaneously into the right femoral vein.  The temporary pacing wire was then advanced into the right ventricular  apex.  It was confirmed to have adequate pacing parameters and placed at  a backup rate of 50 beats per minute.  An 8-French #3.5 CLS guiding  catheter was then advanced into the ascending aorta where the left  coronary os was engaged.  Cine angiography of the left coronary was  conducted in LAO and RAO projections.  The patient then received  standard bolus and constant  infusion of bivalirudin.  The resultant ACT  was 379 seconds.  He has been treated chronically with Plavix.  A 0.014-  inch Luge intracoronary guidewire was passed across the lesion in the  mid and distal LAD without difficulty.  Intravascular ultrasound was  then performed with Atlantis catheter and images were analyzed.  I  proceeded with rotational atherectomy initially with a 1.75 mm bur.  Two  runs were obtained for a maximum of 25 seconds at 150,000 rpm.  This  device was exchanged and replaced with a 2.0 mm bur.  Three runs were  obtained with this bur for a maximum time of 35 seconds at a maximum  rotation of 152,000 rpm.  The 2.0 mm bur was removed and the Rota floppy  wire was exchanged over the wire balloon for the Luge wire.  A 2.75 x 16  mm Taxus Liberte intracoronary drug-eluting stent was advanced into  place in the distal portion of the LAD and deployed there with peak  pressure of 14 atmospheres for 35 seconds.  The stent balloon was  deflated and removed and subsequently a 3.0 x 18 mm Xience intracoronary  drug-eluting stent  was advanced in a place midportion of the LAD and  deployed there at a peak pressure of 14 atmospheres for 37 seconds.  Following removal of the stent delivery device, intravascular ultrasound  was repeated and again the images were analyzed.  The result was felt to  be excellent.  There was some stented jail produced at the large  diagonal branch but seen only in one view.  There was good TIMI grade  III flow within the diagonal.  Therefore, additional interventions were  not performed there.   The guiding catheter was then removed after documentation of adequate  patency in orthogonal views, both with and without the guidewire in  place.  The right femoral arteriogram in the 45-degree RAO angulation  via the catheter sheath by hand injection documented adequate anatomy  for placement of percutaneous closure device Angio-Seal.  This was   subsequently deployed with good hemostasis and an intact distal pulse.  The venous sheath was removed at the same time and good hemostasis was  obtained with prolonged topical pressure.   Intravascular ultrasound images:  Demonstrated heavy calcification that  was mostly superficial throughout the midportion of the LAD with a lumen  of less than 1 mm.  This vein was documented in the more distal lesion,  however, there was less overall calcification.  Following rotational  atherectomy and stent implantation, there was excellent luminal  integrity and diameter at 2.75 x 3.0 distally and 3.0 x 3.0 proximally.   Angiography:  As mentioned, the lesions treated were in the mid and  distal portion of the left anterior descending artery.  The midportion  involved the bifurcation of a large diagonal branch.  Following  rotational atherectomy and stent implantation, there was no residual  stenosis and a good step-up and step-down were seen at both the proximal  and distal ends of the stent.  The stents were overlapped in the mid to  distal portion of the LAD.   FINAL IMPRESSION:  1. Atherosclerotic coronary vascular disease, single vessel.  2. Successful percutaneous coronary intervention/drug-eluting stent      implantation in mid and distal portions of the left anterior      descending artery.  3. Successful adjunctive percutaneous transluminal coronary      angioplasty within the distal anterior descending artery.  4. Superficial and circumferential heavy calcification.  5. Temporary transvenous pacemaker placed with activation during the      case at a rate of 60 beats per minute.  This was during atherectomy      of the anterior descending artery.  6. Intravascular ultrasound displaying adequate luminal integrity and      diameter following balloon dilatation and stent implantation.      [name], M.D.  Electronically Signed     JHE/MEDQ  D:  08/03/2008  T:  08/04/2008   Job:  [id number]

## 2010-07-14 NOTE — Op Note (Signed)
NAME:  [name], [name]                        ACCOUNT NO.:  [id number]   MEDICAL RECORD NO.:  [id number]                   PATIENT TYPE:  INP   LOCATION:  0002                                 FACILITY:  [facility]   PHYSICIAN:  John L. Rendall III, M.D.           DATE OF BIRTH:  1926/10/16   DATE OF PROCEDURE:  11/08/2003  DATE OF DISCHARGE:                                 OPERATIVE REPORT   PREOPERATIVE DIAGNOSIS:  Osteoarthritis left hip.   SURGICAL PROCEDURE:  Left AML total hip replacement.   POSTOPERATIVE DIAGNOSIS:  Osteoarthritis left hip.   SURGEON:  John L. Rendall, M.D.   ASSISTANT:  Rexene Edison, PA-C   ANESTHESIA:  Spinal.   PATHOLOGY:  The patient has bone-against-bone left hip with two loose bodies  actually found in the hip socket.   PROCEDURE:  Under spinal anesthesia, the patient is placed in the right  lateral decubitus position, and the hip is prepared with DuraPrep and draped  as a sterile field.  A posterior approach is made, splitting the skin and  subcutaneous tissue and IT band in the line of its fibers.  The short  Charnley retractor is inserted.  The hip is internally rotated, and the  external rotators and hip capsule are taken down from bone with  electrocautery.  Considerable number of bleeding veins are encountered and  require extensive cauterization.  Once this is completed, the short external  rotators are peeled from the hip capsule.  It is opened in a T-shaped manner  with the Bovie.  The hip is dislocated.  Two loose bodies are encountered.  The superior femoral neck is then exposed.  It is debrided.  IM initiator  and canal finder are used.  Progressive reaming by 1 mm reamers is then done  up to 14 mm, and then the 14.5 reamer is used for a 15 stem.  The femoral  neck is then osteotomized using the guide to assist.  Rasps are then used to  determine the best proximal fill.  Narrow is a little small, and the large  stature was the best fit.   Large stature stem was then nicely seated, 15 mm.  Once this was completed, the acetabulum was exposed with two Cobra  retractors inferiorly.  A __________ was inserted, and two wing retractors  were placed beneath the capsule on the posterior acetabular rim, peeling  back capsule posteriorly and anteriorly, preserving all soft tissues on the  other side of the capsule.  Two large wings were inserted.  The labrum was  then excised along with the ligamentum teres.  The acetabulum was then  progressively reamed, starting with 47, going 47, 49, 51, 53, and then 55,  which gave an excellent fit.  A trial seating of a 54 socket gave a good  bottom out. A trial seating of a 56 socket caused a nondisplaced acetabular  crack in the posterior rim of  the acetabulum extending inferiorly.  This did  not displace and was not loose.  Decision was made to use a __________ cup,  and care was taken on insertion to make sure one of the spikes was seated in  this posterior rim, but first the trial seating of the 56 was used for trial  placement of all components.  A Pinnacle Marathon liner with a 10-degree lip  was then used for a 30-degree ball.  The large stature 15 AML rasp was  inserted and a plus 1 32 head was inserted.  These gave excellent fit,  alignment, stability, and leg length.  With this having been tested and  happy with this, the trial components were removed.  A permanent Pinnacle  cup 300 series was inserted.  Again, as I said, grasping the posterior  portion of the acetabulum nicely.  This was solid in place, absolutely no  motion.  The apex hole eliminator was inserted, and the Marathon acetabular  liner, 56 mm outside diameter, 32 mm inside diameter, the large stature AML  15 mm stem plus 1 32 mm hip ball.  With all of these in place, the hip was  stable through normal range of motion and did not dislocate until 45 degrees  of internal rotation.  Minor trimming of prominent acetabular rim   posteriorly was done to take it down several millimeters as it protruded  about 8-9 mm.  Once this was complicated, the hip capsule was repaired with  #1 Tycron.  Short external rotators were loosely reattached including the  piriformis with #1 Tycron, the IT band closed with #1 Tycron, subcu with #1  and 2-0 Vicryl.  Operating time approximately 1 hour and 10 minutes.  Blood  loss 550 mL.  One Hemovac drain was inserted.  The patient returned to  recovery in good, stable condition.                                               John L. Dorothyann Gibbs, M.D.    [name]  D:  11/08/2003  T:  11/08/2003  Job:  [id number]

## 2010-07-14 NOTE — Op Note (Signed)
[name], [name]              ACCOUNT NO.:  [id number]   MEDICAL RECORD NO.:  [id number]          PATIENT TYPE:  AMB   LOCATION:  DSC                          FACILITY:  [facility]   PHYSICIAN:  Pasty Spillers. Maple Hudson, M.D. DATE OF BIRTH:  1926-05-23   DATE OF PROCEDURE:  01/12/2005  DATE OF DISCHARGE:                                 OPERATIVE REPORT   PREOP DIAGNOSIS:  1.  Right sixth nerve palsy.  2.  Status post previous marginal myotomy and recession of each medial      rectus muscle.   POSTOPERATIVE DIAGNOSIS:  1.  Right sixth nerve palsy.  2.  Status post previous marginal myotomy and recession of each medial      rectus muscle.   PROCEDURE:  1.  Right medial rectus muscle recession, 3.0 mm, on adjustable suture.  2.  Right lateral rectus muscle resection, 8.0 mm   SURGEON:  Pasty Spillers. Maple Hudson, M.D.   ANESTHESIA:  General (laryngeal mask).   COMPLICATIONS:  None.   DESCRIPTION OF PROCEDURE:  After routine prep evaluation including informed  consent, the patient was taken to the operating room. He was identified by  me. General anesthesia was induced without difficulty after placement of  appropriate monitors. The patient was prepped and draped in a standard  sterile fashion. A lid speculum was placed in the right eye.   A traction suture of 6-0 silk was placed in the superior and inferior  limbus, this was used to draw the eye temporally. A limbal conjunctival  peritomy at 2 o'clock hour, extent was made nasally, in the right eye, with  Westcott scissors, and relaxing incisions in the superonasal and inferonasal  quadrants. Scar tissue from previous surgery was encountered during this  dissection. The right medial rectus muscle was engaged on a series of muscle  hooks and cleared of its fascial attachments and scar tissue. The muscle was  found inserted 8.5 mm posterior to the limbus. The tendon was secured with a  double-arm 6-0 Vicryl suture, with a double-lock bite at  each border of the  muscle, approximately 2 mm from the insertion. A double-locking bite was  placed at each border of the muscle. The muscle was disinserted. Each pole  suture was passed into sclera in crossed-swords fashion; at a measured  distance of 5.5 mm posterior to the limbus.   The muscle was drawn up to this level. The pole sutures were joined together  least 10 cm above the sclera. The sutures were engaged and a needle driver  at a measured distance of 7.0 mm above sclera. A noose suture was tied  around the two pole sutures at this dislocation, and tied securely. The  muscle was allowed to hang back 7 mm from the original insertion, for a net  re-recession of 3 mm from the location where the muscle was found today.  Conjunctival was reapposed with 6-0 plain gut sutures, leaving conjunctiva  recessed to the level of the original insertion.   An inferotemporal fornix incision was made with Westcott scissors, by which  the right lateral rectus muscle was  engaged on a series of muscle hooks and  carefully cleared of its fascial attachments. The muscle was spread between  two self-retaining hooks. A 2-mm bite was taken in the center of muscle  belly with a double-arm 6-0 Vicryl suture, a measured distance of 8.0 mm  posterior to the insertion, and a knot was tied securely at this location.  The needle at each end of the double-arm suture was passed from the center  of the muscle to the periphery, parallel to and 8.0 mm posterior to the  insertion; and a double-locking bite was placed at each border of the  muscle. A resection clamp was placed on the muscle just anterior to these  sutures. The muscle was disinserted. Each pole suture was passed posteriorly-  to-anteriorly through the periphery of muscle stump; and then anteriorly-to-  posteriorly near the center of the stump; then posteriorly-to-anteriorly  through the center of muscle belly, just posterior to the previously  placed  knot. The muscle was drawn up to the to the level of the original insertion,  and all slack was removed before the suture ends were tied securely.   The clamp was removed. The portion of the muscle anterior to the sutures was  carefully excised. Conjunctiva was closed in three 6-0 Vicryl sutures. The 6-  0 silk traction suture was repositioned at the nasal limbus. The pole,  noose, and traction suture were taped to the right cheek with a Steri-Strip.  Tobrex ointment placed in the eye. A patch was placed over the eye. The  patient was awakened without difficulty and taken to the recovery in stable  condition, having suffered no intraoperative or immediate postop  complications.      Pasty Spillers. Maple Hudson, M.D.  Electronically Signed     WOY/MEDQ  D:  01/12/2005  T:  01/14/2005  Job:  [id number]

## 2010-07-14 NOTE — Discharge Summary (Signed)
[name], [name]              ACCOUNT NO.:  [id number]   MEDICAL RECORD NO.:  [id number]          PATIENT TYPE:  OIB   LOCATION:  3706                         FACILITY:  [facility]   PHYSICIAN:  Thereasa Solo. Little, M.D. DATE OF BIRTH:  [birth year]   DATE OF ADMISSION:  05/24/2005  DATE OF DISCHARGE:  05/26/2005                                 DISCHARGE SUMMARY   Mr. Graylon is a 77 year old white male patient of Dr. Gaspar Garbe B. Little who  came in to the hospital for outpatient cardiac catheterization by Dr. Lenise Herald in Dr. Fredirick Maudlin absence.  The patient apparently was having dyspnea  on exertion, increasing shortness of breath, increasing fatigue and chest  discomfort ongoing for about three weeks.  Thus, it was decided that he  should undergo cardiac catheterization.  He had known three vessel disease  by a catheterization in 1998; at that time, it was only mild disease.   His cardiac catheterization showed continued mild nonobstructive disease  with LAD 30%, 50%, and 40% lesions in the proximal to midportion and a 30%  lesion in the circumflex.  His EF was 45-50%.  He had mild global  hypokinesis.  It was planned that he would be discharged home the following  day; however, he had a hematoma develop.  Dopplers were performed that  showed pseudoaneurysm; thus, he underwent compression of the pseudoaneurysm  on May 25, 2005.   The following day, May 26, 2005, his blood pressure was 133/76, pulse was  57, respirations 20, temperature 97.5, O2 saturation 94% on room air.  His  right groin was bruised but it was soft.  He was walking in the halls  without any severe discomfort.  Thus, he was considered stable for discharge  to home.  We told him no driving x2 days.  He should restrict his activity  for one week with no lifting greater than 10 pounds and no prolonged  walking.  We discussed with the patient his complaints of shortness of  breath.  His chest x-ray prior to  admission showed that he had some chronic  lung changes.  He has a remote history of tobacco use per Dr. Gaspar Garbe  Little's note.  In the future, Dr. Clarene Duke will consider getting a CT scan  and pulmonary function study test.   When he was seen in the office, his blood pressure was elevated and  consideration of hypertensive treatment was discussed.  However, in the  hospital, his blood pressure ranged from 134/71 and 133/76, so no  antihypertensive was started.   LABORATORY DATA:  None done in the hospital.  They were all outpatient labs.  Chest x-ray as described above.   DISCHARGE MEDICATIONS:  1.  Zocor 20 mg 1 tablet daily.  2.  Aspirin 81 mg 1 tablet daily.  3.  Valium, Tylenol and Prilosec p.r.n. as before.   He is to follow up with Dr. Clarene Duke on June 11, 2005.   DISCHARGE DIAGNOSES:  1.  Dyspnea on exertion and shortness of breath symptoms, not anginal      equivalent, status post cardiac catheterization  with only mild      nonobstructive disease.  2.  Coronary artery disease, mild and nonobstructive.  Status post cardiac      catheterization May 24, 2005.  3.  Ejection fraction of 45-50%.  4.  Chest x-ray with chronic lung changes.  5.  Hyperlipidemia.  6.  Postcatheterization pseudoaneurysm with compression with resolution of      pseudoaneurysm.      Lezlie Octave, N.P.    ______________________________  Thereasa Solo. Little, M.D.    BB/MEDQ  D:  05/26/2005  T:  05/29/2005  Job:  [id number]   cc:   Tasia Catchings, M.D.  Fax: [phone]

## 2010-07-14 NOTE — Op Note (Signed)
[place]  Patient:    [name], [name]                     MRN: [id number] Proc. Date: 01/12/00 Adm. Date:  [id number] Attending:  Glenna Fellows Tappan                           Operative Report  PREOPERATIVE DIAGNOSIS:       1. Right flank hernia.                               2. Recurrent left inguinal hernia.  SURGICAL PROCEDURE:           1. Repair of right flank hernia with mesh.                               2. Repair of left recurrent inguinal hernia                                  with mesh.  SURGEON:  Lorne Skeens. Hoxworth, M.D.  ASSISTANT:  Catalina Lunger, M.D.  ANESTHESIA:  General.  BRIEF HISTORY:  This patient is a 77 year old white male who has noted a gradually increasing bulge in his right flank for several months.  This has been mildly uncomfortable, but in recent days, this has become significantly painful, related to motion.  Examination has revealed a definite bulge in the right midabdomen, extending from about the edge of the rectus sheath anteriorly back around toward the right flank at just about or above the level of the umbilicus.  He has seen Dr. Delma Officer for chronic back problems, and he has not felt that this was related to a neurologic problem.  I have obtained a CT scan of the abdomen and pelvis, which did not show any other apparent cause for his pain or other major problem.  Also on routine examination, he has been found to have a recurrent left inguinal hernia that is minimally symptomatic.  After a long discussion with the patient, we have elected to repair his right flank hernia and his recurrent left inguinal hernia.  The nature of the procedure, its indication, the risks of bleeding, infection, recurrence, and failure to relieve his pain have been discussed and understood preoperatively.  DESCRIPTION OF OPERATION:  The patient was brought to the operating room and was placed in the  supine position on the operating table, and general endotracheal anesthesia was induced.  Broad spectrum antibiotics were given preoperatively.  The Foley catheter was placed.  The entire abdomen and right flank including the left groin were sterilely prepped and draped. A roll had been placed under the patients back to position him partially up on his left side.  The area of bulging had been previously outlined with a marker preoperatively.   A transverse incision was made through the central portion of this area, extending from about the edge of the rectus sheath anteriorly back toward the right flank and lower rib cage.  Dissection was carried down through the subcutaneous tissue using cautery.  The external oblique was identified, and the skin and subcu flaps were raised off the external oblique in all directions.  This muscle appeared normal without any discrete defects. The  external oblique was then divided along the lines of its fibers, and the internal oblique and edges of the rectus sheath were widely exposed.  Again, there was no discrete defect in the muscle or hernia sac identified at this level.  The internal oblique was then divided along the lines of its fibers down to the transversalis fascia.  This did appear somewhat attenuated and floppy but without any real obvious discrete edges.  At this point, we elected to reinforce this area widely with a piece of Prolene mesh. The internal oblique was closed back over the transversalis fascia with running 2-0 PDS. The plane between the external and internal oblique was developed widely anteriorly to over the rectus sheath superiorly up over the lower rib cage, inferiorly down to the iliac crest, and laterally back toward the flank and edge of the trapezius.  A piece of Prolene mesh quite large to cover this entire area was trimmed to size and was placed beneath the external oblique. It was sutured widely at the periphery with  interrupted 2-0 Prolene sutures back to the edges of the dissection as described above.  This appeared to provide wide, solid coverage over the area where the bulge had been detected. Following this, a closed suction drain was left on top of the mesh and brought out through a separate stab wound.  The external oblique was closed over this with running 0 Prolene.  The skin was closed with staples.  Attention was then turned to the left inguinal area.  An oblique incision was made from the pubic tubercle laterally.  Dissection was carried down through the subcutaneous tissue with cautery.  The external oblique was identified laterally to the previous dissection through normal tissue and then dissected free more medially through scar tissue out to the external ring, and the pubic tubercle was identified.  The external oblique was divided along the lines of the fibers over the cord.  There was a moderate amount of scarring, but the cord was able to be clearly identified and dissected away from scarring at the floor and the external oblique, and the external oblique was cleared down to the inguinal ligament, which was free.  The cord was elevated up off the pubic tubercle and dissected away from scarring up to the internal ring and completely freed.  The floor of the canal was intact.  The internal ring was dilated, and there was a good sized portion of preperitoneal fat herniating through the internal ring.  This was divided between clamps and excised and tied with 3-0 Vicryl.  The internal ring was snugged back to normal size with a couple of 2-0 Prolene sutures.  Following this, a piece of Prolene mesh was trimmed to size to fit the floor of the inguinal canal with tails around the cord at the internal ring.  The mesh was sutured loosely to the pubic tubercle and then to the inguinal ligament, working medial to lateral with running 2-0 Prolene.  The mesh was sutured to the edge of the rectus  sheath with 2-0 Prolene.  The tails were then tacked together lateral to the cord with interrupted 2-0 Prolene, creating a new internal ring, snug to a fingertip. The cord was returned to its anatomic position.  The external oblique was  closed with a running 3-0 Vicryl.  The Scarpas fascia was closed with running 3-0 Vicryl and the skin with running subcuticular 4-0 Monocryl and Steri-Strips.  Sponge and needle counts were correct.  Dressings and abdominal  binder were applied.  The patient was taken to recovery in good condition, having tolerated the procedure well. DD:  01/12/00 TD:  01/12/00 Job: [id number]

## 2010-07-14 NOTE — Discharge Summary (Signed)
NAMESINA, LUCCHESI              ACCOUNT NO.:  [id number]   MEDICAL RECORD NO.:  [id number]          PATIENT TYPE:  INP   LOCATION:                               FACILITY:  [facility]   PHYSICIAN:  Danise Edge, M.D.   DATE OF BIRTH:  1926-10-26   DATE OF ADMISSION:  10/22/2008  DATE OF DISCHARGE:  10/24/2008                               DISCHARGE SUMMARY   DISCHARGE DIAGNOSIS:  Resolved gastrointestinal bleeding, etiology  undetermined.   HISTORY:  [name] is an 77 year old male, born 07-16-1926.  When the patient began passing melenic appearing stool associated with  indigestion while taking aspirin, Plavix and Coumadin, the patient was  instructed to discontinue Coumadin.  He continued to pass melenic stool  despite discontinuing Coumadin, aspirin and Plavix associated with a  slowly falling hemoglobin.  The patient was admitted to the hospital for  evaluation.   Approximately 2 months ago, the patient had drug-eluting coronary stent  placed.  In 2004, his screening colonoscopy was normal except for the  presence of universal colonic diverticulosis.   On admission, the patient was experiencing intermittent dyspnea,  lightheadedness, and indigestion but denied chest pain or abdominal  pain.  There is no history of peptic ulcer disease.  He denied nausea,  vomiting or hematemesis.   MEDICATION ALLERGIES:  1. CODEINE.  2. FLOMAX.   CHRONIC MEDICATIONS:  Aspirin, Tylenol, Prevacid, Aleve, Valium, Plavix,  Crestor, Ambien.   PAST MEDICAL HISTORY:  1. Right-sided meningioma producing right sixth cranial nerve palsy      followed at [facility].  2. Coronary artery disease, drug-eluting coronary stent placed 2      months ago.  3. Gastroesophageal reflux.  4. Palpitations.  5. Hypercholesterolemia.  6. Depression.  7. Degenerative joint disease.  8. Bilateral knee replacement surgery.  9. Left hip replacement.  10.Recent right hip  replacement surgery complicated by recurrent      dislocations of the prostatic hip.  11.Three lower disk operations.  12.Insomnia.  13.Headaches.  14.Impotence.  15.Diverticulitis requiring sigmoid resection for stricture.  16.Universal colonic diverticulosis by 2004 colonoscopy.  17.Hemorrhoids.  18.Hearing loss.  19.Benign prostatic hypertrophy.  20.Appendectomy.  21.Bilateral inguinal hernia repairs.  22.TURP.  23.Sigmoid colon resection for diverticular disease.  24.Skin nodules removed.  25.Rotator cuff tear on the right requiring surgery year surgery.  26.Ear surgery twice.  27.Cholecystectomy.  28.Bilateral lens implants.   HABITS:  The patient quit smoking cigarettes in 1965.  He consumes  alcohol nightly.  He is a retired Designer, industrial/product.   HOSPITAL COURSE:  The patient was admitted to the hospital and required  2 units of packed red blood cells transfusion.  His diagnostic  esophagogastroduodenoscopy was normal.  Proctocolonoscopy to the cecum  was normal except for universal colonic diverticulosis.   The patient's Coumadin was not restarted, but he was restarted on his  aspirin and Plavix panel and will need to remain on aspirin and Plavix  for at least 1 year post placement of drug-eluting coronary artery  stents.  He had no further signs of  gastrointestinal bleeding on aspirin  and Plavix and was discharged in stable medical condition.   The patient is to undergo right hip surgery to correct dislocating hip  prosthesis.  This will be scheduled at a later date.           ______________________________  Danise Edge, M.D.     MJ/MEDQ  D:  10/27/2008  T:  10/27/2008  Job:  [id number]   cc:   Jonny Ruiz L. Rendall, M.D.  Fax: [phone]   Corliss Marcus, M.D.

## 2010-07-14 NOTE — Cardiovascular Report (Signed)
[name], [name]              ACCOUNT NO.:  [id number]   MEDICAL RECORD NO.:  [id number]          PATIENT TYPE:  OIB   LOCATION:  3706                         FACILITY:  [facility]   PHYSICIAN:  Nathan Priestly, MD  DATE OF BIRTH:  04/14/1926   DATE OF PROCEDURE:  05/24/2005  DATE OF DISCHARGE:                              CARDIAC CATHETERIZATION   PROCEDURE:  1.  Left heart catheterization.  2.  Coronary angiography.  3.  Left ventriculogram.  4.  Abdominal aortogram.   ATTENDING PHYSICIAN:  [name], M.D.   COMPLICATIONS:  None.   INDICATIONS FOR PROCEDURE:  [name] is a 77 year old male patient of Dr.  Caprice [name] and Dr. Tasia [name] with a history of non-critical CAD by cath  in 1998, history of tobacco use, who recently has complained of increasing  shortness of breath and dyspnea on exertion.  He is now referred for cardiac  catheterization to re-evaluate his CAD as a possible etiology of his  increasing shortness of breath.   DESCRIPTION OF PROCEDURE:  After giving informed written consent, the  patient was brought to the cardiac cath lab where the right and left groins  were shaved, prepped and draped in a sterile fashion.  ECG monitoring was  established.  Using modified Seldinger technique, a 6 French arterial sheath  was inserted in the right femoral artery.  6 French diagnostic catheters  were used to perform diagnostic angiography.   The left main is noted to have a calcification in its proximal portion but  no significant stenosis.  It should be noted that the distal abdominal aorta  as well as the iliacs are extremely tortuous.   The LAD is a medium to large size vessel which coursed to the apex with one  diagonal branch.  The LAD is also noted to be calcified in its proximal  portion with 30% ostial, 50% proximal, and 40% mid lesion after the take off  of the first diagonal.  The first diagonal is a medium size vessel which  bifurcates  distally with no significant disease.   The left circumflex is a medium to large size vessel which courses to the AV  groove and gives rise to three obtuse marginal branches.  The AV groove  circumflex is irregular with mild 30% mid vessel stenosis.  The first and  second obtuse marginals are medium size vessels with no significant disease.  The third OM is a large size vessel with no significant disease.   The right coronary artery is a medium size vessel which is dominant and  gives rise to a PDA.  There is noted to be calcification of the ostial  portion of the RCA.  The remainder of the RCA as well as the PDA is mildly  irregular but has no high grade stenosis.   Left ventriculogram reveals mildly depressed EF of 45-50% with mild global  hypokinesis.  There is mild to moderate MR noted, though it is difficult to  quantitate secondary to PVCs.   The abdominal aorta is tortuous and calcified, however, there does not  appear to be any high grade lesions.  The renal arteries are patent  bilaterally.   HEMODYNAMICS:  Systemic arterial pressure 164/81, LV systolic pressure  165/15, LVEDP 22.   CONCLUSION:  1.  Noncritical coronary artery disease.  2.  Mildly depressed LV systolic function.  3.  Mild to moderate mitral regurgitation.  4.  Tortuous and calcified distal abdominal aorta.  5.  Systemic hypertension.      Nathan Priestly, MD  Electronically Signed     RHM/MEDQ  D:  05/24/2005  T:  05/25/2005  Job:  [id number]   cc:   [name], M.D.  Fax: [phone]   [name], M.D.  Fax: [phone]

## 2010-07-14 NOTE — H&P (Signed)
NAME:  [name], [name]                        ACCOUNT NO.:  [id number]   MEDICAL RECORD NO.:  [id number]                   PATIENT TYPE:  INP   LOCATION:  NA                                   FACILITY:  [facility]   PHYSICIAN:  John L. Rendall, M.D.               DATE OF BIRTH:  [birth year]   DATE OF ADMISSION:  11/08/2003  DATE OF DISCHARGE:                                HISTORY & PHYSICAL   CHIEF COMPLAINT:  Progressively worsening right knee pain for the last 3  months.   HISTORY OF PRESENT ILLNESS:  This 77 year old white male patient presented  to Dr. Jonny Ruiz L. Rendall with a history of bilateral knee replacements in the  past.  The left one was done by Dr. Priscille Kluver and the right one in [place].  He had been having intermittent pain that he associated with the left knee  and it had been getting gradually and progressively worse over the last 3  months.  He had no known injury and no prior surgery to his hip, but had had  the knee replacements.  He attributed the pain to his knee.   At this point, the pain is an aching and sharp sensation located just  diffusely about the left knee.  He has great difficulty rising from a  sitting to a standing position and that seems to cause a fair amount of pain  for him.  The pain also increases with any prolonged sitting and then  decreases if he walks or stands.  He does have chronic back problems and  some chronic back pain, but he denies any paresthesias or night pain and no  popping, grinding, locking or giving-way of his leg.  He does have some  difficulty putting on his socks and shoes, and he says that is getting  worse.  Initial evaluation showed end-stage osteoarthritis with only a  millimeter of joint space noted in the left hip and 2 mm noted on the right.  It was felt that the pain was coming mostly from his hip, so an intra-  articular cortisone injection was done by radiology and that relieved his  pain for about 3 weeks.  After that,  the pain returned.  He is currently  taking Darvocet and Relafen for pain and that provides minimal relief.  He  does not ambulate with any assistive devices.   ALLERGIES:  CODEINE causes severe anxiety.   CURRENT MEDICATIONS:  1.  Relafen 500 mg 1 tablet p.o. b.i.d.  2.  Darvocet-N 100 one to two tablets q.4-6 h. p.r.n. for pain.  3.  Diazepam 5 mg 1 tablet p.o. nightly p.r.n. insomnia.  4.  Multivitamin 1 tablet p.o. q.a.m.  5.  Nasacort spray 1 squirt each naris nightly.  6.  Nexium 40 mg 1 tablet p.o. q.a.m.   PAST MEDICAL HISTORY:  1.  Gastroesophageal reflux disease.  2.  Diverticulosis which required part  of a gastric resection in 1988 due to      the diverticula.   He denies any history of diabetes mellitus, hypertension, thyroid disease,  hiatal hernia, peptic ulcer disease, heart disease, asthma or any other  chronic medical condition, other than noted previously.   PAST SURGICAL HISTORY:  1.  Left total knee arthroplasty by Dr. Jonny Ruiz L. Rendall, January 06, 1996.  2.  Right total knee arthroplasty done by a doctor in [place], April 20, 2002.  3.  Repair of left torn rotator cuff by Dr. Jonny Ruiz L. Rendall.  4.  Laparoscopic cholecystectomy by Dr. Rose Phi. Young.  5.  Appendectomy by Dr. Theotis Burrow.  6.  Gastric resection due to diverticula in 1988 by Dr. Rose Phi. Young.  7.  Laser back surgery in 2001.  8.  Surgery on his right ear.  9.  Bilateral herniorrhaphies.  10. Left knee arthroscopy by Dr. Jonny Ruiz L. Rendall.  11. Bilateral cataracts about 4-5 years ago by Dr. Delane Ginger.   He denies any complications from the above-mentioned procedures.   SOCIAL HISTORY:  He has a 14-pack-year history of cigarette smoking which he  quit in 1962.  He does drink maybe 2-3 alcoholic beverages a week.  He is  divorced and has a son and a daughter.  He lives by himself in a 2-story  house with 3-7 steps into the main entrance.  He does currently maintain and  work at a landfill.   He is otherwise retired.  Medical doctor is Dr. Tasia Catchings at [facility] [place].   FAMILY HISTORY:  His mother died at the age of 52 with heart disease.  Father died at the age of 21 with emphysema, pneumonia and complications  from a car accident.  He had 1 brother who passed away at age 69 with liver  cancer and he has 2 sisters, age 81 and 3, one with Alzheimer's and one  with back problems.  Son is 53 and his daughter is 75, and they are both  alive and well.   REVIEW OF SYSTEMS:  He does have some chronic problems with double vision.  He does have some tinnitus.  He has had some balance problems and possibly  some dizziness, and that was worked up by Dr. Gaspar Garbe B. Little in June of  this year.  He does have some sinus congestion at times and occasional sore  neck.  He reports when Dr. Clarene Duke evaluate him, he said he had had an  irregular heart beat in the past and he has had some chest pain prior to  that evaluation, and also Dr. Clarene Duke reports he probably had a mild heart  attack in the 1980s.  Dr. Clarene Duke had no further recommendations for him.  He  does wear glasses.  He does not have a living will and he does have a power  of attorney.  All other systems are negative and noncontributory.   PHYSICAL EXAM:  GENERAL:  Well-developed, well-nourished white male in no  acute distress.  He walks with a slightly antalgic gait.  Mood and affect  are appropriate.  Height 5 feet 9 inches, weight 190 pounds; BMI is 27.5.  VITAL SIGNS:  Temperature 96.8 degrees Fahrenheit, pulse 60, respirations 14  and BP 148/88.  HEENT:  Normocephalic, atraumatic, without frontal or maxillary sinus  tenderness to palpation.  Conjunctivae pink, sclerae anicteric.  PERRLA.  EOMs intact.  No visible external ear deformities.  He  does have a slightly  reddened area just behind his left ear; no drainage from that area.  Tympanic membranes are pearly gray bilaterally with good light reflex.  Nose and  nasal septum midline.  Nasal mucosa pink and moist without exudates or  polyps noted.  Buccal mucosa pink and moist.  Good dentition.  Pharynx  without erythema or exudates.  Tongue and uvula midline.  Tongue without  fasciculations and uvula rises equally with phonation.  NECK:  No visible masses or lesions noted.  Trachea midline.  No palpable  lymphadenopathy nor thyromegaly.  Carotids +2 bilaterally without bruits.  Full range of motion and nontender to palpation along the cervical spine.  CARDIOVASCULAR:  Heart rate and rhythm regular.  S1 and S2 present without  rubs, clicks or murmurs noted.  RESPIRATORY:  Respirations even and unlabored.  Breath sounds clear to  auscultation bilaterally without rales or wheezes noted.  ABDOMEN:  Rounded abdominal contour.  Bowel sounds present x4 quadrants.  Soft, nontender to palpation, without hepatosplenomegaly nor CVA tenderness.  Femoral pulses +2 bilaterally.  BACK:  Nontender to palpation along the vertebral column.  BREASTS/GU/RECTAL:  These exams deferred at this time.  MUSCULOSKELETAL:  No obvious deformities of bilateral upper extremities with  full range of motion to these extremities without pain.  Radial pulses +2  bilaterally.  He has full range of motion of his knees, ankles and toes  bilaterally.  DP and PT pulses are +2.  No calf pain with palpation.  Negative Homans sign.  He does have well-healed midline knee incisions on  both knees.  Full extension and flexion of about 110-115 degrees at both  knees.  No pain with palpation on his knees bilaterally.  Stable to varus  and valgus stress.  Negative anterior drawer.  Right hip has full extension  and flexion to about 110 degrees with pretty much full internal and external  rotation.  No pain with palpation about the hip and no pain with any range  of motion of the hip.  Left hip has full extension, but flexion only to 80  degrees and there is no internal or external rotation.  Any  attempt at these  movements causes pain.  No pain with palpation about the hip.  NEUROLOGIC:  Alert and oriented x3.  Cranial nerves II-XII are grossly  intact.  Strength 5/5, bilateral upper and lower extremities.  Rapid  alternating movements intact.  Deep tendon reflexes 2+, bilateral upper and  lower extremities.   RADIOLOGIC FINDINGS:  X-rays taken in June and August of this year of his  pelvis show close to end-stage osteoarthritis of both hips.  He has only  about a millimeter of joint space left in the left hip and 2 mm left on the  right.   IMPRESSION:  1.  End-stage osteoarthritis of bilateral hips, left worse than right.  2.  History of osteoarthritis of bilateral knees status post knee      replacements.  3.  Gastroesophageal reflux disease.  4.  Diverticulosis.  5.  History of rotator cuff tear.  6.  History of mild myocardial infarction in the 1980s, recently evaluated      by Dr. Caprice Kluver.  PLAN:  Mr. Longsworth will be admitted to [facility] on November 08, 2003, where he will undergo a left total hip arthroplasty by Dr. Jonny Ruiz L.  Rendall.  He will undergo all the routine preoperative laboratory tests and  studies prior to this  procedure.  If we have any medical issues while he is  hospitalized, we will consult Dr. Sherin Quarry.     Legrand Pitts Duffy, P.A.                      John L. Priscille Kluver, M.D.    KED/MEDQ  D:  11/03/2003  T:  11/03/2003  Job:  [id number]

## 2010-07-14 NOTE — Discharge Summary (Signed)
[facility]  Patient:    [name], [name]                     MRN: [id number] Adm. Date:  [id number] Disc. Date: [id number] Attending:  Delsa Bern CC:         Genene Churn. Sherin [name], M.D.   Discharge Summary  DISCHARGE DIAGNOSES: 1. Abdominal pain. 2. Right flank hernia. 3. Recurrent left inguinal hernia.  OPERATIONS/PROCEDURES: 1. Repair of right flank hernia with mesh. 2. Repair of recurrent left inguinal hernia with mesh on January 12, 2000.  HISTORY OF PRESENT ILLNESS:  [name] is a 77 year old white male who has been followed by [name] for back pain for some time.  He recently has noted the gradual onset of bulge and pain in his right flank and abdomen. He has had more acute pain for several days prior to admission.  He has been evaluated by [name], and this is not felt to be a back problem.  He has had an MRI and a CT scan of the abdomen that were unremarkable.  He continues to have pain.  Physical exam has also revealed a recurrent left inguinal hernia.  PAST MEDICAL HISTORY: 1. Osteoarthritis. 2. Mild MI 10-12 years ago and is followed by [name].  PRIMARY PHYSICIAN:  [name].  PAST SURGICAL HISTORY: 1. Laparotomy for diverticulosis in 1984. 2. Appendectomy. 3. Laparoscopic cholecystectomy. 4. Knee surgery.  MEDICATIONS:  Celebrex 200 mg a day.  ALLERGIES:  CODEINE.  SOCIAL HISTORY, FAMILY HISTORY, REVIEW OF SYSTEMS:  See detailed H&P.  PHYSICAL EXAMINATION:  VITAL SIGNS:  Afebrile, vital signs within normal limits.  GENERAL:  Healthy appearing white male.  Pertinent findings were limited to the abdomen which revealed a definite, somewhat diffuse right mid abdominal and flank hernia.  Also recurrent, somewhat tender left inguinal hernia.  HOSPITAL COURSE:  After extensive consultation in the office, due to the pain and obvious deformity of the abdominal wall, we elected to proceed  with repair.  He was admitted on January 12, 2000, and underwent repair of a right flank hernia with mesh and repair of a recurrent left inguinal hernia with mesh.  Please see the operative note for details.  Postoperatively, his course was smooth.  He had a fair amount of pain the first couple of days, but then this rapidly improved.  He was started on a regular diet and able to be advanced without difficulty.  He was felt ready for discharge on January 16, 2000.  Wounds are healing nicely without infection.  JP drain remains in place in the flank.  Follow-up is to be in my office in five to seven days. DD:  01/30/00 TD:  01/30/00 Job: [id number]

## 2010-07-14 NOTE — Discharge Summary (Signed)
NAME:  [name], [name]                        ACCOUNT NO.:  [id number]   MEDICAL RECORD NO.:  [id number]                   PATIENT TYPE:  INP   LOCATION:  0482                                 FACILITY:  [facility]   PHYSICIAN:  John L. Rendall, M.D.               DATE OF BIRTH:  1926-06-15   DATE OF ADMISSION:  11/08/2003  DATE OF DISCHARGE:  11/12/2003                                 DISCHARGE SUMMARY   Discharged to [facility].   ADMISSION DIAGNOSES:  1.  End-stage osteoarthritis left hip.  2.  Gastroesophageal reflux disease.  3.  History of diverticulosis.   DISCHARGE DIAGNOSES:  1.  Left total hip arthroplasty.  2.  Electrolyte imbalance with hypokalemia and hyponatremia resolved.  3.  Asymptomatic postoperative blood loss anemia  4.  History of gastroesophageal reflux disease.  5.  History of diverticulosis.   HISTORY OF PRESENT ILLNESS:  The patient is a 77 year old white male with a  history of bilateral total knee arthroplasties presents with approximately a  80-month history of progressively worsening significant left hip pain.  He  describes it as intense, deep, aching sensation with a sharp shooting pain.  It increases with any prolonged sitting or standing.  He denies any numbness  or tingling going the lower extremities.  Last cortisone injection worked  for about 3 weeks.  X-rays reveal end-stage osteoporosis bilateral hips.   ALLERGIES:  CODEINE causing anxiety and hyperactivity.   CURRENT MEDICATIONS:  1.  Relafen 500 mg p.o. b.i.d.  2.  Darvocet one tablet p.o. q.4 h. p.r.n..  3.  Diazepam 5 mg p.o. q.h.s.  4.  Multivitamins.  5.  Nasacort one squirt q. naris q.h.s.  6.  Nexium 40 mg p.o. daily.   SURGICAL PROCEDURE:  On November 08, 2003, the patient was taken to the OR  by Dr. Erasmo Leventhal assisted by Rexene Edison, P.A.-C.  The patient underwent a  left AML total hip replacement with a size 56 acetabular cup, large stature  AML 15 mm stem, a 32 mm inside  diameter 56 mm outside diameter acetabular  liner, apex hole eliminator,  and a size 32 mm +1 femoral ball.  The patient  tolerated the procedure well.  Had a small acetabular fracture but was well  fixated with a TriSpike cup and otherwise the patient did well and was  transferred to the recovery room and then to the orthopedic floor in good  condition.   CONSULTS:  The following routine consults were requested:  Physical Therapy,  Occupational Therapy, Case Management.   HOSPITAL COURSE:  On November 08, 2003 the patient was admitted to [facility] in the care of Dr. Jonny Ruiz Rendall.  The patient was taken to the  OR where a left total hip arthroplasty was performed.  The patient tolerated  the procedure well.  There were no complications.  The patient was  transferred to the recovery  room and then to the orthopedic floor for  routine hip protocol and was started on Arixtra for routine DVT prophylaxis.   The patient then incurred a total of 4 days postoperative care.  The patient  did develop some slight hyponatremia and calcemia.  The sodium level was  corrected with IV fluids and the calcium level was improved with p.o.  replacement.  The patient also developed some slight postoperative blood  loss anemia with his hemoglobin dropping to 10.8 but his vital signs  remained stable.  He remained without complaints.  No transfusion was  performed and was allowed to correct it with good nutritional status and  multivitamins.   The patient's vital signs otherwise remained stable.  The patient's wound  remained benign for any signs of infection.  He had a moderate amount of  serosanguineous discharge from the wound so he was placed on Keflex and  provide him for a total of 7 days.  The patient worked well with physical  therapy and his pain was well controlled with p.o. meds after being taken  off his PCA.  On postop day #4 the patient was felt to be medically and  orthopedically  stable and ready for discharge to outpatient facility.  He  had previously arranged for [facility] so we are  transferring him to that unit in good condition with routine outpatient home  health physical therapy and follow up.   LABORATORY DATA:  CBC on September 15th:  WBC 10, hemoglobin 10.8,  hematocrit 31.4, platelets 131.   Routine chemistries on September 14th:  Sodium 135, potassium 4.2, glucose  126 felt to be routine postoperative stress and lack of activity with no  treatment given, BUN 9, creatinine 1.1, calcium 8 up from a low of 7.  His  preoperatively HIV test was nonreactive.  Routine urinalysis on admission  was normal.  Preadmission chest x-ray showed minimal subsegmental  atelectasis versus scarring right base on September 7th.  EKG on admission  showed a normal sinus rhythm at a rate of 58 beats per minute with a left  bundle branch block.   MEDICATIONS UPON DISCHARGE FROM ORTHOPEDIC FLOOR:  1.  Colace 100 mg p.o. b.i.d.  2.  Trinsicon one tablet p.o. t.i.d.  3.  Arixtra 2.5 mg subcu q.24 h. for a total of 10 days, today is day #4.  4.  Multivitamins with minerals one tablet daily.  5.  Protonix 80 mg p.o. daily.  6.  Nasonex one spray per naris q.h.s.  7.  Calcium carbonate 500 mg p.o. b.i.d.  8.  Methylcellulose 1 packet q.h.s. p.r.n.  9.  Keflex 500 mg p.o. q.i.d. for a total of 7 days, today is day #2.  10. Laxative enema of choice p.r.n.  11. Percocet one or two tablets every 4-6 hours p.r.n.  12. Tylenol 650 mg p.o. q.4 h. p.r.n.  13. Robaxin 500 mg p.o. q.6 h. p.r.n.  14. Restoril 15 mg p.o. q.h.s. p.r.n.  15. Valium 5 mg p.o. q.h.s. p.r.n.   DISCHARGE INSTRUCTIONS:  1.  Medications:  The patient is to continue medications as dispensed on      orthopedic floor.  Please continue the Arixtra for a total of 10 days,      today is day #4. 2.  Activity:  The patient may be weightbearing as tolerated with the use of      a walker  following total hip protocol and hip precautions.  3.  Diet:  No restrictions.  4.  Wound Care:  The patient should have dressing changed daily and on a      p.r.n. rate as for serosanguineous discharge.  If any signs of      infection, please call Dr. Sable Feil office for advice.  Staples are to      be removed on postop day #14, today is day #4.  5.  Follow Up:  The patient should have follow up appointment with Dr.      Sable Feil office.  Please call [phone] two weeks from discharge from      [facility].   The patient's condition upon discharge from [facility] is listed as improved and good.     Jamelle Rushing, P.A.                      John L. Priscille Kluver, M.D.    RWK/MEDQ  D:  11/12/2003  T:  11/12/2003  Job:  [id number]

## 2010-12-23 ENCOUNTER — Emergency Department (HOSPITAL_COMMUNITY): Payer: Medicare Other

## 2010-12-23 ENCOUNTER — Emergency Department (HOSPITAL_COMMUNITY)
Admission: EM | Admit: 2010-12-23 | Discharge: 2010-12-23 | Disposition: A | Discharge status: Home / Self Care | Payer: Medicare Other | Attending: Emergency Medicine | Admitting: Emergency Medicine

## 2010-12-23 DIAGNOSIS — W57XXXA Bitten or stung by nonvenomous insect and other nonvenomous arthropods, initial encounter: Secondary | ICD-10-CM | POA: Insufficient documentation

## 2010-12-23 DIAGNOSIS — T148 Other injury of unspecified body region: Secondary | ICD-10-CM | POA: Insufficient documentation

## 2010-12-23 DIAGNOSIS — R509 Fever, unspecified: Secondary | ICD-10-CM | POA: Insufficient documentation

## 2010-12-23 DIAGNOSIS — N390 Urinary tract infection, site not specified: Secondary | ICD-10-CM | POA: Insufficient documentation

## 2010-12-23 DIAGNOSIS — R197 Diarrhea, unspecified: Secondary | ICD-10-CM | POA: Insufficient documentation

## 2010-12-23 DIAGNOSIS — R110 Nausea: Secondary | ICD-10-CM | POA: Insufficient documentation

## 2010-12-23 DIAGNOSIS — R5381 Other malaise: Secondary | ICD-10-CM | POA: Insufficient documentation

## 2010-12-23 LAB — BASIC METABOLIC PANEL
BUN: 14 mg/dL (ref 6–23)
Creatinine, Ser: 1.19 mg/dL (ref 0.50–1.35)
GFR calc Af Amer: 64 mL/min — ABNORMAL LOW (ref >90)
GFR calc non Af Amer: 55 mL/min — ABNORMAL LOW (ref >90)
Glucose, Bld: 104 mg/dL — ABNORMAL HIGH (ref 70–99)

## 2010-12-23 LAB — DIFFERENTIAL
Eosinophils Relative: 1 % (ref 0–5)
Lymphocytes Relative: 7 % — ABNORMAL LOW (ref 12–46)
Lymphs Abs: 0.9 10*3/uL (ref 0.7–4.0)
Monocytes Absolute: 0.9 10*3/uL (ref 0.1–1.0)
Monocytes Relative: 7 % (ref 3–12)

## 2010-12-23 LAB — URINALYSIS, ROUTINE W REFLEX MICROSCOPIC
Bilirubin Urine: NEGATIVE
Ketones, ur: NEGATIVE mg/dL
Nitrite: NEGATIVE
pH: 8 (ref 5.0–8.0)

## 2010-12-23 LAB — CBC
HCT: 43.2 % (ref 39.0–52.0)
MCHC: 35 g/dL (ref 30.0–36.0)
MCV: 91.1 fL (ref 78.0–100.0)
RDW: 14.3 % (ref 11.5–15.5)

## 2010-12-23 LAB — URINE MICROSCOPIC-ADD ON

## 2010-12-25 LAB — URINE CULTURE
Colony Count: >=100000
Culture  Setup Time: 201210272007

## 2011-01-05 ENCOUNTER — Encounter (HOSPITAL_BASED_OUTPATIENT_CLINIC_OR_DEPARTMENT_OTHER): Payer: Self-pay | Admitting: *Deleted

## 2011-01-05 NOTE — Progress Notes (Signed)
NPO after MN. Pt to arrive at 0700. Needs istat and ekg if no current gets faxed from [facility] cardiology [phone]). Requested note, ekg, stress test and echo.  Will take prevacid am of surg. W/ sip of water. Reviewed [facility] guidelines and will bring meds.

## 2011-01-08 ENCOUNTER — Encounter (HOSPITAL_BASED_OUTPATIENT_CLINIC_OR_DEPARTMENT_OTHER): Payer: Self-pay | Admitting: Anesthesiology

## 2011-01-08 ENCOUNTER — Encounter (HOSPITAL_BASED_OUTPATIENT_CLINIC_OR_DEPARTMENT_OTHER)
Admission: RE | Disposition: A | Discharge status: Home / Self Care | Payer: Self-pay | Source: Ambulatory Visit | Attending: Urology

## 2011-01-08 ENCOUNTER — Ambulatory Visit (HOSPITAL_BASED_OUTPATIENT_CLINIC_OR_DEPARTMENT_OTHER): Payer: Medicare Other | Admitting: Anesthesiology

## 2011-01-08 ENCOUNTER — Other Ambulatory Visit: Payer: Self-pay

## 2011-01-08 ENCOUNTER — Ambulatory Visit (HOSPITAL_BASED_OUTPATIENT_CLINIC_OR_DEPARTMENT_OTHER)
Admission: RE | Admit: 2011-01-08 | Discharge: 2011-01-09 | Disposition: A | Discharge status: Home / Self Care | Payer: Medicare Other | Source: Ambulatory Visit | Attending: Urology | Admitting: Urology

## 2011-01-08 DIAGNOSIS — I059 Rheumatic mitral valve disease, unspecified: Secondary | ICD-10-CM | POA: Insufficient documentation

## 2011-01-08 DIAGNOSIS — N529 Male erectile dysfunction, unspecified: Secondary | ICD-10-CM

## 2011-01-08 DIAGNOSIS — I2510 Atherosclerotic heart disease of native coronary artery without angina pectoris: Secondary | ICD-10-CM | POA: Insufficient documentation

## 2011-01-08 DIAGNOSIS — N400 Enlarged prostate without lower urinary tract symptoms: Secondary | ICD-10-CM

## 2011-01-08 DIAGNOSIS — I4891 Unspecified atrial fibrillation: Secondary | ICD-10-CM | POA: Insufficient documentation

## 2011-01-08 DIAGNOSIS — Z79899 Other long term (current) drug therapy: Secondary | ICD-10-CM | POA: Insufficient documentation

## 2011-01-08 DIAGNOSIS — N401 Enlarged prostate with lower urinary tract symptoms: Secondary | ICD-10-CM | POA: Insufficient documentation

## 2011-01-08 DIAGNOSIS — N320 Bladder-neck obstruction: Secondary | ICD-10-CM | POA: Insufficient documentation

## 2011-01-08 DIAGNOSIS — N138 Other obstructive and reflux uropathy: Secondary | ICD-10-CM | POA: Insufficient documentation

## 2011-01-08 DIAGNOSIS — R0602 Shortness of breath: Secondary | ICD-10-CM | POA: Insufficient documentation

## 2011-01-08 DIAGNOSIS — N402 Nodular prostate without lower urinary tract symptoms: Secondary | ICD-10-CM

## 2011-01-08 DIAGNOSIS — I252 Old myocardial infarction: Secondary | ICD-10-CM | POA: Insufficient documentation

## 2011-01-08 DIAGNOSIS — G4730 Sleep apnea, unspecified: Secondary | ICD-10-CM | POA: Insufficient documentation

## 2011-01-08 DIAGNOSIS — K219 Gastro-esophageal reflux disease without esophagitis: Secondary | ICD-10-CM | POA: Insufficient documentation

## 2011-01-08 HISTORY — DX: Sleep apnea, unspecified: G47.30

## 2011-01-08 HISTORY — DX: Other obstructive and reflux uropathy: N13.8

## 2011-01-08 HISTORY — DX: Acute myocardial infarction, unspecified: I21.9

## 2011-01-08 HISTORY — DX: Gastro-esophageal reflux disease without esophagitis: K21.9

## 2011-01-08 HISTORY — PX: TRANSURETHRAL RESECTION OF PROSTATE: SHX73

## 2011-01-08 HISTORY — DX: Male erectile dysfunction, unspecified: N52.9

## 2011-01-08 HISTORY — DX: Unspecified hearing loss, unspecified ear: H91.90

## 2011-01-08 HISTORY — DX: Reserved for concepts with insufficient information to code with codable children: IMO0002

## 2011-01-08 HISTORY — DX: Paroxysmal atrial fibrillation: I48.0

## 2011-01-08 HISTORY — DX: Reserved for inherently not codable concepts without codable children: IMO0001

## 2011-01-08 HISTORY — DX: Benign prostatic hyperplasia with lower urinary tract symptoms: N40.1

## 2011-01-08 HISTORY — DX: Personal history of other diseases of the digestive system: Z87.19

## 2011-01-08 HISTORY — DX: Unspecified osteoarthritis, unspecified site: M19.90

## 2011-01-08 HISTORY — DX: Benign neoplasm of meninges, unspecified: D32.9

## 2011-01-08 HISTORY — DX: Encounter for other specified aftercare: Z51.89

## 2011-01-08 HISTORY — DX: Diverticulitis of large intestine without perforation or abscess with bleeding: K57.33

## 2011-01-08 LAB — POCT I-STAT, CHEM 8
BUN: 18 mg/dL (ref 6–23)
Calcium, Ion: 1.22 mmol/L (ref 1.12–1.32)
Creatinine, Ser: 1.3 mg/dL (ref 0.50–1.35)
Glucose, Bld: 94 mg/dL (ref 70–99)
TCO2: 22 mmol/L (ref 0–100)

## 2011-01-08 SURGERY — TURP (TRANSURETHRAL RESECTION OF PROSTATE)
Anesthesia: General | Site: Bladder | Wound class: Clean Contaminated

## 2011-01-08 MED ORDER — LIDOCAINE HCL (CARDIAC) 20 MG/ML IV SOLN
INTRAVENOUS | Status: DC | PRN
  Administered 2011-01-08: 100 mg via INTRAVENOUS

## 2011-01-08 MED ORDER — CEFAZOLIN SODIUM 1-5 GM-% IV SOLN
1.0000 g | Freq: Once | INTRAVENOUS | Status: AC
  Administered 2011-01-08: 1 g via INTRAVENOUS

## 2011-01-08 MED ORDER — SODIUM CHLORIDE 0.9 % IR SOLN
Status: DC | PRN
  Administered 2011-01-08: 9000 mL

## 2011-01-08 MED ORDER — DIAZEPAM 5 MG PO TABS: 5.0000 mg | ORAL_TABLET | Freq: Four times a day (QID) | ORAL | Status: DC | PRN

## 2011-01-08 MED ORDER — PROPOFOL 10 MG/ML IV EMUL
INTRAVENOUS | Status: DC | PRN
  Administered 2011-01-08: 150 mg via INTRAVENOUS

## 2011-01-08 MED ORDER — CEFAZOLIN SODIUM 1-5 GM-% IV SOLN
1.0000 g | Freq: Three times a day (TID) | INTRAVENOUS | Status: AC
  Administered 2011-01-08 (×2): 1 g via INTRAVENOUS

## 2011-01-08 MED ORDER — BELLADONNA ALKALOIDS-OPIUM 16.2-60 MG RE SUPP
RECTAL | Status: DC | PRN
  Administered 2011-01-08: 1 via RECTAL

## 2011-01-08 MED ORDER — DEXAMETHASONE SODIUM PHOSPHATE 4 MG/ML IJ SOLN
INTRAMUSCULAR | Status: DC | PRN
  Administered 2011-01-08: 4 mg via INTRAVENOUS

## 2011-01-08 MED ORDER — LACTATED RINGERS IV SOLN
INTRAVENOUS | Status: DC
  Administered 2011-01-08 (×2): via INTRAVENOUS

## 2011-01-08 MED ORDER — ONE-DAILY MULTI VITAMINS PO TABS: 1.0000 | ORAL_TABLET | Freq: Every day | ORAL | Status: DC

## 2011-01-08 MED ORDER — CLOPIDOGREL BISULFATE 75 MG PO TABS: 75.0000 mg | ORAL_TABLET | Freq: Every day | ORAL | Status: DC

## 2011-01-08 MED ORDER — ONDANSETRON HCL 4 MG/2ML IJ SOLN
INTRAMUSCULAR | Status: DC | PRN
  Administered 2011-01-08: 4 mg via INTRAVENOUS

## 2011-01-08 MED ORDER — BELLADONNA ALKALOIDS-OPIUM 16.2-60 MG RE SUPP: 1.0000 | Freq: Four times a day (QID) | RECTAL | Status: DC | PRN

## 2011-01-08 MED ORDER — SODIUM CHLORIDE 0.45 % IV SOLN
INTRAVENOUS | Status: DC
  Administered 2011-01-08 – 2011-01-09 (×3): via INTRAVENOUS

## 2011-01-08 MED ORDER — TRAMADOL HCL 50 MG PO TABS: 50.0000 mg | ORAL_TABLET | Freq: Four times a day (QID) | ORAL | Status: DC | PRN

## 2011-01-08 MED ORDER — HYDROCODONE-ACETAMINOPHEN 5-325 MG PO TABS
1.0000 | ORAL_TABLET | ORAL | Status: DC | PRN
  Administered 2011-01-08: 1 via ORAL

## 2011-01-08 MED ORDER — ONDANSETRON HCL 4 MG/2ML IJ SOLN: 4.0000 mg | INTRAMUSCULAR | Status: DC | PRN

## 2011-01-08 MED ORDER — DOCUSATE SODIUM 100 MG PO CAPS
100.0000 mg | ORAL_CAPSULE | Freq: Two times a day (BID) | ORAL | Status: DC
  Administered 2011-01-08 (×2): 100 mg via ORAL

## 2011-01-08 MED ORDER — FENTANYL CITRATE 0.05 MG/ML IJ SOLN
INTRAMUSCULAR | Status: DC | PRN
  Administered 2011-01-08: 50 ug via INTRAVENOUS
  Administered 2011-01-08 (×4): 25 ug via INTRAVENOUS

## 2011-01-08 MED ORDER — ZOLPIDEM TARTRATE 5 MG PO TABS
5.0000 mg | ORAL_TABLET | Freq: Every evening | ORAL | Status: DC | PRN
  Administered 2011-01-08: 5 mg via ORAL

## 2011-01-08 MED ORDER — PANTOPRAZOLE SODIUM 40 MG PO TBEC
40.0000 mg | DELAYED_RELEASE_TABLET | Freq: Every day | ORAL | Status: DC
  Administered 2011-01-08: 40 mg via ORAL

## 2011-01-08 MED ORDER — AMIODARONE HCL 100 MG PO TABS
100.0000 mg | ORAL_TABLET | Freq: Every evening | ORAL | Status: DC
  Administered 2011-01-08: 100 mg via ORAL

## 2011-01-08 MED ORDER — FENTANYL CITRATE 0.05 MG/ML IJ SOLN
25.0000 ug | INTRAMUSCULAR | Status: DC | PRN
  Administered 2011-01-08: 50 ug via INTRAVENOUS
  Administered 2011-01-08: 25 ug via INTRAVENOUS

## 2011-01-08 SURGICAL SUPPLY — 40 items
BAG DRAIN URO-CYSTO SKYTR STRL (DRAIN) ×2 IMPLANT
BAG DRN ANRFLXCHMBR STRAP LEK (BAG)
BAG DRN UROCATH (DRAIN) ×1
BAG URINE DRAINAGE (UROLOGICAL SUPPLIES) ×1 IMPLANT
BAG URINE LEG 19OZ MD ST LTX (BAG) IMPLANT
CANISTER SUCT LVC 12 LTR MEDI- (MISCELLANEOUS) ×1 IMPLANT
CATH FOLEY 2WAY SLVR  5CC 18FR (CATHETERS) ×1
CATH FOLEY 2WAY SLVR  5CC 20FR (CATHETERS)
CATH FOLEY 2WAY SLVR  5CC 22FR (CATHETERS)
CATH FOLEY 2WAY SLVR 30CC 22FR (CATHETERS) IMPLANT
CATH FOLEY 2WAY SLVR 5CC 18FR (CATHETERS) IMPLANT
CATH FOLEY 2WAY SLVR 5CC 20FR (CATHETERS) IMPLANT
CATH FOLEY 2WAY SLVR 5CC 22FR (CATHETERS) IMPLANT
CATH FOLEY 3WAY 30CC 22F (CATHETERS) IMPLANT
CATH HEMA 3WAY 30CC 24FR COUDE (CATHETERS) IMPLANT
CATH HEMA 3WAY 30CC 24FR RND (CATHETERS) IMPLANT
CLOTH BEACON ORANGE TIMEOUT ST (SAFETY) ×2 IMPLANT
DRAPE CAMERA CLOSED 9X96 (DRAPES) ×2 IMPLANT
ELECT BUTTON BIOP 24F 90D PLAS (MISCELLANEOUS) IMPLANT
ELECT BUTTON HF 24-28F 2 30DE (ELECTRODE) ×1 IMPLANT
ELECT LOOP HF 26F 30D .35MM (CUTTING LOOP) IMPLANT
ELECT NEEDLE 45D HF 24-28F 12D (CUTTING LOOP) IMPLANT
ELECT REM PT RETURN 9FT ADLT (ELECTROSURGICAL)
ELECT RESECT VAPORIZE 12D CBL (ELECTRODE) ×2 IMPLANT
ELECTRODE REM PT RTRN 9FT ADLT (ELECTROSURGICAL) IMPLANT
EVACUATOR MICROVAS BLADDER (UROLOGICAL SUPPLIES) IMPLANT
GLOVE BIO SURGEON STRL SZ8 (GLOVE) ×2 IMPLANT
GLOVE ECLIPSE 6.0 STRL STRAW (GLOVE) ×1 IMPLANT
GLOVE INDICATOR 6.5 STRL GRN (GLOVE) ×1 IMPLANT
GOWN BRE IMP SLV AUR LG STRL (GOWN DISPOSABLE) ×1 IMPLANT
GOWN STRL REIN XL XLG (GOWN DISPOSABLE) ×2 IMPLANT
GOWN XL W/COTTON TOWEL STD (GOWNS) ×2 IMPLANT
HOLDER FOLEY CATH W/STRAP (MISCELLANEOUS) ×2 IMPLANT
IV NS IRRIG 3000ML ARTHROMATIC (IV SOLUTION) ×6 IMPLANT
KIT ASPIRATION TUBING (SET/KITS/TRAYS/PACK) ×1 IMPLANT
LOOP CUTTING 24FR OLYMPUS (CUTTING LOOP) IMPLANT
PACK CYSTOSCOPY (CUSTOM PROCEDURE TRAY) ×2 IMPLANT
PLUG CATH AND CAP STER (CATHETERS) IMPLANT
SYR 30ML LL (SYRINGE) IMPLANT
SYRINGE IRR TOOMEY STRL 70CC (SYRINGE) ×1 IMPLANT

## 2011-01-08 NOTE — Anesthesia Procedure Notes (Addendum)
Procedure Name: LMA Insertion Date/Time: 01/08/2011 8:27 AM Performed by: Iline Oven Pre-anesthesia Checklist: Patient identified, Emergency Drugs available, Suction available and Patient being monitored Patient Re-evaluated:Patient Re-evaluated prior to inductionOxygen Delivery Method: Circle System Utilized Preoxygenation: Pre-oxygenation with 100% oxygen Intubation Type: IV induction Ventilation: Mask ventilation without difficulty LMA: LMA with gastric port inserted LMA Size: 5.0 Number of attempts: 1 Placement Confirmation: positive ETCO2 Tube secured with: Tape Dental Injury: Teeth and Oropharynx as per pre-operative assessment

## 2011-01-08 NOTE — Anesthesia Preprocedure Evaluation (Signed)
Anesthesia Evaluation  Patient identified by MRN, date of birth, ID band Patient awake    Reviewed: Allergy & Precautions, H&P , NPO status , Patient's Chart, lab work & pertinent test results, reviewed documented beta blocker date and time   Airway Mallampati: II TM Distance: >3 FB Neck ROM: Full    Dental  (+) Caps   Pulmonary sleep apnea ,  Non-compliant w/ CPAP  clear to auscultation        Cardiovascular Exercise Tolerance: Poor hypertension, + CAD and + Cardiac Stents Regular Normal Stent in LAD 2010. Currently asymptomatic Hx A Fib. Good control w/ amiodarone   Neuro/Psych Negative Neurological ROS  Negative Psych ROS   GI/Hepatic negative GI ROS, Neg liver ROS,   Endo/Other  Negative Endocrine ROS  Renal/GU BPH   BPH    Musculoskeletal negative musculoskeletal ROS (+)   Abdominal   Peds negative pediatric ROS (+)  Hematology negative hematology ROS (+)   Anesthesia Other Findings Front cap  Reproductive/Obstetrics negative OB ROS                           Anesthesia Physical Anesthesia Plan  ASA: III  Anesthesia Plan: General   Post-op Pain Management:    Induction: Intravenous  Airway Management Planned: LMA  Additional Equipment:   Intra-op Plan:   Post-operative Plan: Extubation in OR  Informed Consent:   Plan Discussed with: CRNA and Surgeon  Anesthesia Plan Comments:         Anesthesia Quick Evaluation

## 2011-01-08 NOTE — Op Note (Signed)
Preoperative diagnosis:  BPH with obstruction  Postoperative diagnosis: Same  Procedure: TURP with gyrus button  Surgeon: Almas Rake  Anesthesia: Gen. with LMA  Specimen: None  Drain: 18 French Foley catheter  Complications: None  Estimated blood loss: Minimal    Brief history: This 77 year old male presents for TURP. He has a history of a similar procedure approximately 20 years ago. He has been on maximal medical therapy recently for significant voiding symptoms. Unfortunately, they have not been effective in alleviating his symptoms. He underwent recent cystoscopy in the office, in October, 2012. This showed obstruction with nodular regrowth of his prostate. At this point, he desires surgical management, due to the failure of maximal medical therapy. TURP has been discussed with the patient, including risks and complications. These include, but are not limited to bleeding, infection, inability to void postoperatively, as well as injury to his inflatable penile prosthesis. He accepts these, and desires to proceed. He is aware that the goal of the procedure is to help him void adequately without the use of medical therapy.  Description of procedure: The patient was identified in the holding area. He received preoperative IV antibiotics. He was then taken to the operating suite where general anesthetic was administered with the LMA. He was placed in the dorsal lithotomy position. Genitalia and perineum were prepped and draped. Time out was then performed.  A 26 French resectoscope sheath was then advanced using the obturator into his bladder. The resectoscope element was then placed with the [facility] resectoscope element. The button was placed. Brief inspection was performed of the bladder. There were moderate trabeculations. No tumors or foreign bodies were noted. Ureteral orifices were identified. They were normal and well away from the bladder neck. There was significant nodular regrowth,  mostly anteriorly, but also from the left prostatic lobe. Using the gyrus button, the paced nodular regrowth was ablated, down to the circular fibers of the surgical capsule. This was performed circumferentially. Most of the resection/vaporization was performed anteriorly. Around the area of the verumontanum superiorly, there was also some obstructive tissue which was also ablated. Following ablation of the entire prostate, inspection revealed no bleeding. Using the coag current, the entire resected/ablated area was coagulated. No active bleeding was seen at this point. The scope was brought back to the area of the vera montanum. There was no evident obstructing tissue at this point, and the prostatic fossa was wide open. The irrigation was turned off, and no bleeding was seen. The bladder was inspected, and no tissue was seen. At this point, the scope was removed. With the bladder fairly full, there was an active stream seen. At this point an 78 French Foley catheter was placed, with 30 cc of water placed in the balloon. The catheter was placed on traction.  At this point, the procedure was terminated. The patient tolerated the procedure well and was returned to the PACU in stable condition.

## 2011-01-08 NOTE — Transfer of Care (Signed)
Immediate Anesthesia Transfer of Care Note  Patient: [name]  Procedure(s) Performed:  TRANSURETHRAL RESECTION OF THE PROSTATE (TURP) - GYRUS OWER  Patient Location: PACU  Anesthesia Type: General  Level of Consciousness: awake, sedated, patient cooperative and responds to stimulation  Airway & Oxygen Therapy: Patient Spontanous Breathing and Patient connected to face mask oxygen  Post-op Assessment: Report given to PACU RN, Post -op Vital signs reviewed and stable and Patient moving all extremities  Post vital signs: Reviewed and stable  Complications: No apparent anesthesia complications

## 2011-01-08 NOTE — Anesthesia Postprocedure Evaluation (Signed)
  Anesthesia Post-op Note  Patient: [name]  Procedure(s) Performed:  TRANSURETHRAL RESECTION OF THE PROSTATE (TURP) - GYRUS OWER  Patient Location: PACU  Anesthesia Type: General  Level of Consciousness: alert  and oriented  Airway and Oxygen Therapy: Patient Spontanous Breathing and Patient connected to nasal cannula oxygen  Post-op Pain: mild  Post-op Assessment: Post-op Vital signs reviewed, Patient's Cardiovascular Status Stable, Respiratory Function Stable and Patent Airway  Post-op Vital Signs: stable  Complications: No apparent anesthesia complications 

## 2011-01-08 NOTE — Anesthesia Postprocedure Evaluation (Signed)
  Anesthesia Post-op Note  Patient: [name]  Procedure(s) Performed:  TRANSURETHRAL RESECTION OF THE PROSTATE (TURP) - GYRUS OWER  Patient Location: PACU  Anesthesia Type: General  Level of Consciousness: alert  and oriented  Airway and Oxygen Therapy: Patient Spontanous Breathing and Patient connected to nasal cannula oxygen  Post-op Pain: mild  Post-op Assessment: Post-op Vital signs reviewed, Patient's Cardiovascular Status Stable, Respiratory Function Stable and Patent Airway  Post-op Vital Signs: stable  Complications: No apparent anesthesia complications

## 2011-01-08 NOTE — H&P (Signed)
Urology Admission H&P  Chief Complaint:Difficulty voiding  History of Present Illness: [name] is here today for TURP  . He underwent TURP years ago by Dr. Patsi Sears. I have no old records regarding that. He has been seen several times over the past year for voiding symptoms-frequency, urgency, intermittency, hesitancy, dealing of incomplete emptying. He has not had any evidence of urinary tract infections, and has had no blood either microscopically or grossly. He has been tried on medical therapy for his voiding symptoms without improvement-Jalyn and alpha blockers alone. These have not helped his symptoms at all. He does have a little discomfort and feeling of incomplete emptying after he completes voiding.He underwent cystoscopy in October, 2012. This revealed regrowth of his prostate with obstructing nodularity. Because of his voiding symptoms and obvious prostatic regrowth, he is here today for repeat TURP.  We have discussed the procedure with him, as well as risks and complications. He realizes that the goal is to void better, obviating the need for medical therapy.He does have a history of a penile implant.  Past Medical History  Diagnosis Date  . Myocardial infarction 1980's- medical intervention  . Acute myocardial infarction 2010    s/p ptca  x2 DE stents  . PAF (paroxysmal atrial fibrillation)     controlled w/ amiodarone  . Sleep apnea     non-compliant cpap  . Heart murmur     mitrial regurg.  [name] Shortness of breath     w/ exertion  . GERD (gastroesophageal reflux disease)     occ. take prevacid  . Headache     sinus  . Meningioma right -sided w/ right VI palsy    followed by dr Sharene Skeans  . Diverticulitis of colon with bleeding     s/p sigmoid resection '88  . History of GI diverticular bleed april 2012    transfused blood and resolved without surgical intervention  . Impotence, organic     s/p penile prosthesis 1990's  . Coronary artery disease     cardiologist  at [facility]- last visit 3 mon ago- requested note,ekg,echo,stress test  . DJD (degenerative joint disease) hips and knees    s/p bilateral total replacements  . DDD (degenerative disc disease)     chronic back pain  . BPH (benign prostatic hypertrophy) with urinary obstruction     s/p turp yrs ago  . Blood transfusion   . Impaired hearing bilateral     only left hearing aid   Past Surgical History  Procedure Date  . Cholecystectomy   . Joint replacement 03-25-08--total right hip     s/ revision's x2 and sev. closed reduction for dislocation last one 06-20-09  . Joint replacement 2005    total left hip  . Joint replacement 2004    total right knee and revision  . Joint replacement 1997    total left knee  . Hernia repair     bilateral inguinal hernia repair  . Cataract extraction w/ intraocular lens  implant, bilateral   . Appendectomy   . Transurethral resection of prostate yrs ago  . Penile prosthesis implant 1990's  . Rotator cuff repair left  . Right ear surgery yrs ago  . Cardiac catheterization 2007    noncritical cad (results w/ chart)  . Coronary angioplasty with stent placement 08-03-08    drug-eluting stent x2 distal and mid lad    Home Medications:  Prescriptions prior to admission  Medication Sig Dispense Refill  . amiodarone (PACERONE) 100 MG tablet  Take 100 mg by mouth every evening. Unsure of dosage       . clopidogrel (PLAVIX) 75 MG tablet Take 75 mg by mouth daily.        . diazepam (VALIUM) 5 MG tablet Take 5 mg by mouth every 6 (six) hours as needed.        . doxycycline (ADOXA) 100 MG tablet Take 100 mg by mouth 2 (two) times daily.        . Multiple Vitamin (MULTIVITAMIN) tablet Take 1 tablet by mouth daily.        . traMADol (ULTRAM) 50 MG tablet Take 50 mg by mouth every 6 (six) hours as needed. Maximum dose= 8 tablets per day       . lansoprazole (PREVACID) 30 MG capsule Take 30 mg by mouth as needed.         Allergies:  Allergies  Allergen  Reactions  . Codeine Other (See Comments)    Insomnia/ hyper  . Coumadin Other (See Comments)    Hx gi bleed    History reviewed. No pertinent family history. Social History:  reports that he quit smoking about 50 years ago. He has never used smokeless tobacco. He reports that he drinks about 3 ounces of alcohol per week. He reports that he does not use illicit drugs.  Review of Systems  Constitutional: Negative.   HENT: Negative.   Eyes: Negative.   Respiratory: Negative.   Cardiovascular: Negative.   Gastrointestinal: Negative.   Genitourinary: Positive for urgency and frequency.  Skin: Negative.   Neurological: Negative.   Endo/Heme/Allergies: Negative.   Psychiatric/Behavioral: Negative.     Physical Exam:  Vital signs in last 24 hours: Temp:  [97.5 F (36.4 C)] 97.5 F (36.4 C) (11/12 0732) Pulse Rate:  [52] 52  (11/12 0732) Resp:  [20] 20  (11/12 0732) BP: (134)/(76) 134/76 mmHg (11/12 0732) SpO2:  [93 %] 93 % (11/12 0732) Physical Exam  Vitals reviewed. Constitutional: He is oriented to person, place, and time. He appears well-developed and well-nourished.  HENT:  Head: Normocephalic and atraumatic.  Eyes: Conjunctivae and EOM are normal. Pupils are equal, round, and reactive to light.  Neck: Normal range of motion. Neck supple.  Cardiovascular: Normal rate and regular rhythm.   Respiratory: Effort normal and breath sounds normal.  GI: Soft. Bowel sounds are normal.  Genitourinary: Testes normal and penis normal. Circumcised.       Inflatable penile prosthesis was present  Musculoskeletal: Normal range of motion.  Neurological: He is alert and oriented to person, place, and time.  Skin: Skin is warm, dry and intact.  Psychiatric: He has a normal mood and affect. His speech is normal.    Laboratory Data:  No results found for this or any previous visit (from the past 24 hour(s)). No results found for this or any previous visit (from the past 240  hour(s)). Creatinine: No results found for this basename: CREATININE:7 in the last 168 hours Baseline Creatinine:unknown  Impression/Assessment:  BPH. He is status post TURP a few years ago, he has recurrent symptoms, and nodular regrowth of his prostate. He does have a penile prosthesis in place.  Plan:  TURP with the gyrus device. This should be a fairly straightforward procedure, as the patient has had prior TURP. He will be staying overnight following his procedure.Date of Initial H&P: 01/08/11  History reviewed, patient examined, no change in status, stable for surgery.  Chelsea Aus 01/08/2011, 7:43 AM

## 2011-01-08 NOTE — H&P (Signed)
  Date of Initial H&P: 01/08/2011  History reviewed, patient examined, no change in status, stable for surgery.

## 2011-01-09 MED ORDER — DIAZEPAM 5 MG PO TABS: 5.0000 mg | ORAL_TABLET | Freq: Four times a day (QID) | ORAL | Status: AC | PRN

## 2011-01-09 NOTE — Progress Notes (Signed)
1 Day Post-Op Subjective: Patient reports He has voided well w/o pain. No significant blood.  Objective: Vital signs in last 24 hours: Temp:  [97.1 F (36.2 C)-98.7 F (37.1 C)] 97.5 F (36.4 C) (11/13 0615) Pulse Rate:  [43-72] 60  (11/13 0615) Resp:  [8-24] 18  (11/13 0615) BP: (109-140)/(53-85) 111/54 mmHg (11/13 0615) SpO2:  [88 %-100 %] 96 % (11/13 0615)  Intake/Output from previous day: 11/12 0701 - 11/13 0700 In: 3700 [P.O.:1680; I.V.:2020] Out: -600  Intake/Output this shift:    Physical Exam:  General:Looks comfortable GI: not done    Lab Results:  Basename 01/08/11 0752  HGB 15.6  HCT 46.0   BMET  Basename 01/08/11 0752  NA 140  K 4.1  CL 107  CO2 --  GLUCOSE 94  BUN 18  CREATININE 1.30  CALCIUM --   No results found for this basename: LABPT:3,INR:3 in the last 72 hours No results found for this basename: LABURIN:1 in the last 72 hours Results for orders placed during the hospital encounter of 12/23/10  URINE CULTURE     Status: Normal   Collection Time   12/23/10  3:30 PM      Component Value Range Status Comment   Specimen Description URINE, RANDOM   Final    Special Requests NONE   Final    Setup Time [id number]   Final    Colony Count >=100,000 COLONIES/ML   Final    Culture ESCHERICHIA COLI   Final    Report Status 12/25/2010 FINAL   Final    Organism ID, Bacteria ESCHERICHIA COLI   Final     Studies/Results: No results found.  Assessment/Plan  POD 1 TUR-P, doing well  Will d/c to home   LOS: 1 day   Marcine Matar M 01/09/2011, 7:33 AM

## 2011-01-09 NOTE — Progress Notes (Signed)
Dr. Retta Diones in and gave verbal instructions.

## 2011-01-09 NOTE — Progress Notes (Signed)
Pt requested script to be called in for pain med and ambien. Dr. Retta Diones called and informed of pt rquest.  Pt informed script will be sent for norco, but he will need to talk w/ his internist re Remus Loffler. Pt and wife verbalized their understanding.

## 2011-01-09 NOTE — Progress Notes (Signed)
Reported to D. Freida Busman, RN as caregiver

## 2011-01-15 ENCOUNTER — Encounter (HOSPITAL_BASED_OUTPATIENT_CLINIC_OR_DEPARTMENT_OTHER): Payer: Self-pay | Admitting: Urology

## 2011-04-23 ENCOUNTER — Encounter (INDEPENDENT_AMBULATORY_CARE_PROVIDER_SITE_OTHER): Payer: Medicare Other | Admitting: Ophthalmology

## 2011-04-23 DIAGNOSIS — H3530 Unspecified macular degeneration: Secondary | ICD-10-CM

## 2011-04-23 DIAGNOSIS — H35039 Hypertensive retinopathy, unspecified eye: Secondary | ICD-10-CM

## 2011-04-23 DIAGNOSIS — H43819 Vitreous degeneration, unspecified eye: Secondary | ICD-10-CM

## 2011-04-23 DIAGNOSIS — I10 Essential (primary) hypertension: Secondary | ICD-10-CM

## 2011-04-23 DIAGNOSIS — H348392 Tributary (branch) retinal vein occlusion, unspecified eye, stable: Secondary | ICD-10-CM

## 2011-04-26 ENCOUNTER — Ambulatory Visit (INDEPENDENT_AMBULATORY_CARE_PROVIDER_SITE_OTHER): Payer: Medicare Other | Admitting: Ophthalmology

## 2011-04-26 DIAGNOSIS — H35039 Hypertensive retinopathy, unspecified eye: Secondary | ICD-10-CM

## 2011-04-26 DIAGNOSIS — H348392 Tributary (branch) retinal vein occlusion, unspecified eye, stable: Secondary | ICD-10-CM

## 2011-04-26 DIAGNOSIS — H43819 Vitreous degeneration, unspecified eye: Secondary | ICD-10-CM

## 2011-05-21 ENCOUNTER — Encounter (INDEPENDENT_AMBULATORY_CARE_PROVIDER_SITE_OTHER): Payer: Medicare Other | Admitting: Ophthalmology

## 2011-05-21 DIAGNOSIS — H43819 Vitreous degeneration, unspecified eye: Secondary | ICD-10-CM

## 2011-05-21 DIAGNOSIS — H3530 Unspecified macular degeneration: Secondary | ICD-10-CM

## 2011-05-21 DIAGNOSIS — H348392 Tributary (branch) retinal vein occlusion, unspecified eye, stable: Secondary | ICD-10-CM

## 2011-05-21 DIAGNOSIS — H35039 Hypertensive retinopathy, unspecified eye: Secondary | ICD-10-CM

## 2011-05-21 DIAGNOSIS — I10 Essential (primary) hypertension: Secondary | ICD-10-CM

## 2011-07-02 ENCOUNTER — Encounter (INDEPENDENT_AMBULATORY_CARE_PROVIDER_SITE_OTHER): Payer: Medicare Other | Admitting: Ophthalmology

## 2011-07-03 ENCOUNTER — Encounter (INDEPENDENT_AMBULATORY_CARE_PROVIDER_SITE_OTHER): Payer: Medicare Other | Admitting: Ophthalmology

## 2011-07-03 DIAGNOSIS — I10 Essential (primary) hypertension: Secondary | ICD-10-CM

## 2011-07-03 DIAGNOSIS — H35039 Hypertensive retinopathy, unspecified eye: Secondary | ICD-10-CM

## 2011-07-03 DIAGNOSIS — H43819 Vitreous degeneration, unspecified eye: Secondary | ICD-10-CM

## 2011-07-03 DIAGNOSIS — H348392 Tributary (branch) retinal vein occlusion, unspecified eye, stable: Secondary | ICD-10-CM

## 2011-07-03 DIAGNOSIS — H26499 Other secondary cataract, unspecified eye: Secondary | ICD-10-CM

## 2011-07-13 ENCOUNTER — Other Ambulatory Visit (INDEPENDENT_AMBULATORY_CARE_PROVIDER_SITE_OTHER): Payer: Medicare Other | Admitting: Ophthalmology

## 2011-07-13 DIAGNOSIS — H26499 Other secondary cataract, unspecified eye: Secondary | ICD-10-CM

## 2011-08-14 ENCOUNTER — Encounter (INDEPENDENT_AMBULATORY_CARE_PROVIDER_SITE_OTHER): Payer: Medicare Other | Admitting: Ophthalmology

## 2011-08-14 DIAGNOSIS — H348392 Tributary (branch) retinal vein occlusion, unspecified eye, stable: Secondary | ICD-10-CM

## 2011-08-14 DIAGNOSIS — I10 Essential (primary) hypertension: Secondary | ICD-10-CM

## 2011-08-14 DIAGNOSIS — H35039 Hypertensive retinopathy, unspecified eye: Secondary | ICD-10-CM

## 2011-11-15 ENCOUNTER — Other Ambulatory Visit: Payer: Self-pay | Admitting: Otolaryngology

## 2011-12-12 ENCOUNTER — Institutional Professional Consult (permissible substitution): Payer: Medicare Other | Admitting: Internal Medicine

## 2011-12-17 ENCOUNTER — Ambulatory Visit (INDEPENDENT_AMBULATORY_CARE_PROVIDER_SITE_OTHER): Payer: Medicare Other | Admitting: Ophthalmology

## 2011-12-17 DIAGNOSIS — H3530 Unspecified macular degeneration: Secondary | ICD-10-CM

## 2011-12-17 DIAGNOSIS — H348392 Tributary (branch) retinal vein occlusion, unspecified eye, stable: Secondary | ICD-10-CM

## 2011-12-17 DIAGNOSIS — H43819 Vitreous degeneration, unspecified eye: Secondary | ICD-10-CM

## 2012-01-21 ENCOUNTER — Encounter (INDEPENDENT_AMBULATORY_CARE_PROVIDER_SITE_OTHER): Payer: Medicare Other | Admitting: Ophthalmology

## 2012-01-29 ENCOUNTER — Encounter (INDEPENDENT_AMBULATORY_CARE_PROVIDER_SITE_OTHER): Payer: Medicare Other | Admitting: Ophthalmology

## 2012-01-29 DIAGNOSIS — I10 Essential (primary) hypertension: Secondary | ICD-10-CM

## 2012-01-29 DIAGNOSIS — H43819 Vitreous degeneration, unspecified eye: Secondary | ICD-10-CM

## 2012-01-29 DIAGNOSIS — H348392 Tributary (branch) retinal vein occlusion, unspecified eye, stable: Secondary | ICD-10-CM

## 2012-01-29 DIAGNOSIS — H4010X0 Unspecified open-angle glaucoma, stage unspecified: Secondary | ICD-10-CM

## 2012-01-29 DIAGNOSIS — H3530 Unspecified macular degeneration: Secondary | ICD-10-CM

## 2012-01-29 DIAGNOSIS — H35039 Hypertensive retinopathy, unspecified eye: Secondary | ICD-10-CM

## 2012-04-16 DIAGNOSIS — R05 Cough: Secondary | ICD-10-CM | POA: Diagnosis not present

## 2012-04-16 DIAGNOSIS — R51 Headache: Secondary | ICD-10-CM | POA: Diagnosis not present

## 2012-04-16 DIAGNOSIS — R059 Cough, unspecified: Secondary | ICD-10-CM | POA: Diagnosis not present

## 2012-04-16 DIAGNOSIS — R5381 Other malaise: Secondary | ICD-10-CM | POA: Diagnosis not present

## 2012-04-16 DIAGNOSIS — R42 Dizziness and giddiness: Secondary | ICD-10-CM | POA: Diagnosis not present

## 2012-04-22 DIAGNOSIS — M461 Sacroiliitis, not elsewhere classified: Secondary | ICD-10-CM | POA: Diagnosis not present

## 2012-04-22 DIAGNOSIS — M545 Low back pain, unspecified: Secondary | ICD-10-CM | POA: Diagnosis not present

## 2012-04-22 DIAGNOSIS — M47817 Spondylosis without myelopathy or radiculopathy, lumbosacral region: Secondary | ICD-10-CM | POA: Diagnosis not present

## 2012-04-24 DIAGNOSIS — I10 Essential (primary) hypertension: Secondary | ICD-10-CM | POA: Diagnosis not present

## 2012-04-24 DIAGNOSIS — R51 Headache: Secondary | ICD-10-CM | POA: Diagnosis not present

## 2012-04-24 DIAGNOSIS — Z79899 Other long term (current) drug therapy: Secondary | ICD-10-CM | POA: Diagnosis not present

## 2012-04-25 DIAGNOSIS — H8190 Unspecified disorder of vestibular function, unspecified ear: Secondary | ICD-10-CM | POA: Diagnosis not present

## 2012-04-25 DIAGNOSIS — R1310 Dysphagia, unspecified: Secondary | ICD-10-CM | POA: Diagnosis not present

## 2012-04-28 DIAGNOSIS — M545 Low back pain, unspecified: Secondary | ICD-10-CM | POA: Diagnosis not present

## 2012-04-30 ENCOUNTER — Encounter (INDEPENDENT_AMBULATORY_CARE_PROVIDER_SITE_OTHER): Payer: Medicare Other | Admitting: Ophthalmology

## 2012-04-30 DIAGNOSIS — H348392 Tributary (branch) retinal vein occlusion, unspecified eye, stable: Secondary | ICD-10-CM

## 2012-04-30 DIAGNOSIS — H35039 Hypertensive retinopathy, unspecified eye: Secondary | ICD-10-CM

## 2012-04-30 DIAGNOSIS — H43819 Vitreous degeneration, unspecified eye: Secondary | ICD-10-CM

## 2012-04-30 DIAGNOSIS — H3530 Unspecified macular degeneration: Secondary | ICD-10-CM

## 2012-05-12 DIAGNOSIS — M47817 Spondylosis without myelopathy or radiculopathy, lumbosacral region: Secondary | ICD-10-CM | POA: Diagnosis not present

## 2012-05-23 ENCOUNTER — Emergency Department (HOSPITAL_COMMUNITY): Payer: Medicare Other

## 2012-05-23 ENCOUNTER — Encounter (HOSPITAL_COMMUNITY): Payer: Self-pay | Admitting: *Deleted

## 2012-05-23 ENCOUNTER — Emergency Department (HOSPITAL_COMMUNITY)
Admission: EM | Admit: 2012-05-23 | Discharge: 2012-05-23 | Disposition: A | Discharge status: Home / Self Care | Payer: Medicare Other | Attending: Emergency Medicine | Admitting: Emergency Medicine

## 2012-05-23 DIAGNOSIS — I2510 Atherosclerotic heart disease of native coronary artery without angina pectoris: Secondary | ICD-10-CM | POA: Diagnosis not present

## 2012-05-23 DIAGNOSIS — Z5189 Encounter for other specified aftercare: Secondary | ICD-10-CM | POA: Diagnosis not present

## 2012-05-23 DIAGNOSIS — Z9861 Coronary angioplasty status: Secondary | ICD-10-CM | POA: Insufficient documentation

## 2012-05-23 DIAGNOSIS — Z79899 Other long term (current) drug therapy: Secondary | ICD-10-CM | POA: Insufficient documentation

## 2012-05-23 DIAGNOSIS — K5289 Other specified noninfective gastroenteritis and colitis: Secondary | ICD-10-CM | POA: Insufficient documentation

## 2012-05-23 DIAGNOSIS — Z789 Other specified health status: Secondary | ICD-10-CM | POA: Diagnosis not present

## 2012-05-23 DIAGNOSIS — R112 Nausea with vomiting, unspecified: Secondary | ICD-10-CM | POA: Insufficient documentation

## 2012-05-23 DIAGNOSIS — R1110 Vomiting, unspecified: Secondary | ICD-10-CM | POA: Diagnosis not present

## 2012-05-23 DIAGNOSIS — I4891 Unspecified atrial fibrillation: Secondary | ICD-10-CM | POA: Diagnosis not present

## 2012-05-23 DIAGNOSIS — K219 Gastro-esophageal reflux disease without esophagitis: Secondary | ICD-10-CM | POA: Diagnosis not present

## 2012-05-23 DIAGNOSIS — Z87448 Personal history of other diseases of urinary system: Secondary | ICD-10-CM | POA: Insufficient documentation

## 2012-05-23 DIAGNOSIS — K921 Melena: Secondary | ICD-10-CM | POA: Diagnosis not present

## 2012-05-23 DIAGNOSIS — R0789 Other chest pain: Secondary | ICD-10-CM | POA: Insufficient documentation

## 2012-05-23 DIAGNOSIS — Z7902 Long term (current) use of antithrombotics/antiplatelets: Secondary | ICD-10-CM | POA: Diagnosis not present

## 2012-05-23 DIAGNOSIS — Z87891 Personal history of nicotine dependence: Secondary | ICD-10-CM | POA: Diagnosis not present

## 2012-05-23 DIAGNOSIS — Z8739 Personal history of other diseases of the musculoskeletal system and connective tissue: Secondary | ICD-10-CM | POA: Insufficient documentation

## 2012-05-23 DIAGNOSIS — Z9089 Acquired absence of other organs: Secondary | ICD-10-CM | POA: Insufficient documentation

## 2012-05-23 DIAGNOSIS — R011 Cardiac murmur, unspecified: Secondary | ICD-10-CM | POA: Diagnosis not present

## 2012-05-23 DIAGNOSIS — K920 Hematemesis: Secondary | ICD-10-CM | POA: Diagnosis not present

## 2012-05-23 DIAGNOSIS — Z8719 Personal history of other diseases of the digestive system: Secondary | ICD-10-CM | POA: Insufficient documentation

## 2012-05-23 DIAGNOSIS — Z8709 Personal history of other diseases of the respiratory system: Secondary | ICD-10-CM | POA: Insufficient documentation

## 2012-05-23 DIAGNOSIS — K529 Noninfective gastroenteritis and colitis, unspecified: Secondary | ICD-10-CM

## 2012-05-23 DIAGNOSIS — Z86011 Personal history of benign neoplasm of the brain: Secondary | ICD-10-CM | POA: Diagnosis not present

## 2012-05-23 DIAGNOSIS — R197 Diarrhea, unspecified: Secondary | ICD-10-CM | POA: Diagnosis not present

## 2012-05-23 DIAGNOSIS — H9190 Unspecified hearing loss, unspecified ear: Secondary | ICD-10-CM | POA: Insufficient documentation

## 2012-05-23 DIAGNOSIS — I252 Old myocardial infarction: Secondary | ICD-10-CM | POA: Insufficient documentation

## 2012-05-23 LAB — COMPREHENSIVE METABOLIC PANEL
AST: 22 U/L (ref 0–37)
Albumin: 4.2 g/dL (ref 3.5–5.2)
Alkaline Phosphatase: 100 U/L (ref 39–117)
CO2: 27 mEq/L (ref 19–32)
Chloride: 99 mEq/L (ref 96–112)
Creatinine, Ser: 1.39 mg/dL — ABNORMAL HIGH (ref 0.50–1.35)
GFR calc non Af Amer: 45 mL/min — ABNORMAL LOW (ref >90)
Potassium: 4.4 mEq/L (ref 3.5–5.1)
Total Bilirubin: 1.3 mg/dL — ABNORMAL HIGH (ref 0.3–1.2)

## 2012-05-23 LAB — POCT I-STAT TROPONIN I

## 2012-05-23 LAB — URINALYSIS, ROUTINE W REFLEX MICROSCOPIC
Bilirubin Urine: NEGATIVE
Glucose, UA: NEGATIVE mg/dL
Hgb urine dipstick: NEGATIVE
Ketones, ur: NEGATIVE mg/dL
Protein, ur: NEGATIVE mg/dL
Urobilinogen, UA: 0.2 mg/dL (ref 0.0–1.0)

## 2012-05-23 MED ORDER — SODIUM CHLORIDE 0.9 % IV BOLUS (SEPSIS): 500.0000 mL | Freq: Once | INTRAVENOUS | Status: DC

## 2012-05-23 MED ORDER — ONDANSETRON 8 MG PO TBDP
8.0000 mg | ORAL_TABLET | Freq: Once | ORAL | Status: AC
  Administered 2012-05-23: 8 mg via ORAL
  Filled 2012-05-23: qty 1

## 2012-05-23 MED ORDER — SODIUM CHLORIDE 0.9 % IV SOLN: 1000.0000 mL | INTRAVENOUS | Status: DC

## 2012-05-23 MED ORDER — ONDANSETRON HCL 4 MG/2ML IJ SOLN
4.0000 mg | Freq: Once | INTRAMUSCULAR | Status: AC
  Administered 2012-05-23: 4 mg via INTRAVENOUS
  Filled 2012-05-23: qty 2

## 2012-05-23 MED ORDER — ONDANSETRON HCL 8 MG PO TABS: 8.0000 mg | ORAL_TABLET | Freq: Three times a day (TID) | ORAL | Status: DC | PRN

## 2012-05-23 MED ORDER — MORPHINE SULFATE 4 MG/ML IJ SOLN
2.0000 mg | Freq: Once | INTRAMUSCULAR | Status: AC
  Administered 2012-05-23: 2 mg via INTRAVENOUS
  Filled 2012-05-23: qty 1

## 2012-05-23 NOTE — ED Notes (Signed)
Patient states unable to provide urine sample at this time. Urinal at bedside.

## 2012-05-23 NOTE — ED Notes (Signed)
Pt verbalizes understanding 

## 2012-05-23 NOTE — ED Notes (Signed)
Pt drank cup of water, reports no nausea at this time.

## 2012-05-23 NOTE — ED Notes (Signed)
Pt sts feels much better, finishing drinking second can of ginger ale and sts "I'm ready to get out of here". Dr Effie Shy notified.

## 2012-05-23 NOTE — ED Provider Notes (Signed)
History     CSN: [id number]  Arrival date & time 05/23/12  [id number]   First MD Initiated Contact with Patient 05/23/12 [phone]      Chief Complaint  Patient presents with  . Nausea  . Emesis  . Diarrhea    (Consider location/radiation/quality/duration/timing/severity/associated sxs/prior treatment) HPI Comments: [name] is a 79 y.o. Male who was awakened early this morning by nausea and then vomited. He has had multiple episodes of vomiting both green and yellow in color. After about 6 episodes of vomiting. He noticed a small amount of blood mixed in with the emesis. He ate some fresh spinach, from a box, last night. He lives alone. No one else ate the same food. He has had chills without documented fever. He has noticed chest tightness and pressure since he began vomiting. He denies diaphoresis. There has been no cough or sputum production. He has had several episodes of diarrhea. There's been no blood in the diarrhea. He was able to drink some fluids. This morning without exacerbating the vomiting. There are no other modifying factors.  Patient is a 79 y.o. male presenting with vomiting and diarrhea. The history is provided by the patient.  Emesis Associated symptoms: diarrhea   Diarrhea Associated symptoms: vomiting     Past Medical History  Diagnosis Date  . Myocardial infarction 1980's- medical intervention  . Acute myocardial infarction 2010    s/p ptca  x2 DE stents  . PAF (paroxysmal atrial fibrillation)     controlled w/ amiodarone  . Sleep apnea     non-compliant cpap  . Heart murmur     mitrial regurg.  [name] Shortness of breath     w/ exertion  . GERD (gastroesophageal reflux disease)     occ. take prevacid  . Headache     sinus  . Meningioma right -sided w/ right VI palsy    followed by dr Sharene Skeans  . Diverticulitis of colon with bleeding     s/p sigmoid resection '88  . History of GI diverticular bleed april 2012    transfused blood and resolved without  surgical intervention  . Impotence, organic     s/p penile prosthesis 1990's  . Coronary artery disease     cardiologist at [facility]- last visit 3 mon ago- requested note,ekg,echo,stress test  . DJD (degenerative joint disease) hips and knees    s/p bilateral total replacements  . DDD (degenerative disc disease)     chronic back pain  . BPH (benign prostatic hypertrophy) with urinary obstruction     s/p turp yrs ago  . Blood transfusion   . Impaired hearing bilateral     only left hearing aid    Past Surgical History  Procedure Laterality Date  . Cholecystectomy    . Joint replacement  03-25-08--total right hip     s/ revision's x2 and sev. closed reduction for dislocation last one 06-20-09  . Joint replacement  2005    total left hip  . Joint replacement  2004    total right knee and revision  . Joint replacement  1997    total left knee  . Hernia repair      bilateral inguinal hernia repair  . Cataract extraction w/ intraocular lens  implant, bilateral    . Appendectomy    . Transurethral resection of prostate  yrs ago  . Penile prosthesis implant  1990's  . Rotator cuff repair  left  . Right ear surgery  yrs ago  .  Cardiac catheterization  2007    noncritical cad (results w/ chart)  . Coronary angioplasty with stent placement  08-03-08    drug-eluting stent x2 distal and mid lad  . Transurethral resection of prostate  01/08/2011    Procedure: TRANSURETHRAL RESECTION OF THE PROSTATE (TURP);  Surgeon: Marcine Matar;  Location: [facility];  Service: Urology;  Laterality: N/A;  GYRUS OWER    History reviewed. No pertinent family history.  History  Substance Use Topics  . Smoking status: Former Smoker    Quit date: 01/04/1961  . Smokeless tobacco: Never Used  . Alcohol Use: No      Review of Systems  Gastrointestinal: Positive for vomiting and diarrhea.  All other systems reviewed and are negative.    Allergies  Codeine and Warfarin  sodium  Home Medications   Current Outpatient Rx  Name  Route  Sig  Dispense  Refill  . amiodarone (PACERONE) 100 MG tablet   Oral   Take 100 mg by mouth every evening. Unsure of dosage          . clopidogrel (PLAVIX) 75 MG tablet   Oral   Take 1 tablet (75 mg total) by mouth daily.         . famotidine (PEPCID) 10 MG tablet   Oral   Take 10 mg by mouth daily.         [name] lisinopril (PRINIVIL,ZESTRIL) 5 MG tablet   Oral   Take 5 mg by mouth daily.         . Multiple Vitamin (MULTIVITAMIN) tablet   Oral   Take 1 tablet by mouth daily.           . sodium chloride (OCEAN) 0.65 % nasal spray   Nasal   Place 1 spray into the nose as needed for congestion.         . traMADol (ULTRAM) 50 MG tablet   Oral   Take 50 mg by mouth every 6 (six) hours as needed for pain. Maximum dose= 8 tablets per day         . ondansetron (ZOFRAN) 8 MG tablet   Oral   Take 1 tablet (8 mg total) by mouth every 8 (eight) hours as needed for nausea.   20 tablet   0     BP 118/58  Pulse 90  Temp(Src) 97.7 F (36.5 C) (Oral)  Resp 14  SpO2 91%  Physical Exam  Nursing note and vitals reviewed. Constitutional: He is oriented to person, place, and time. He appears well-developed.  Elderly, frail  HENT:  Head: Normocephalic and atraumatic.  Right Ear: External ear normal.  Left Ear: External ear normal.  Eyes: Conjunctivae and EOM are normal. Pupils are equal, round, and reactive to light.  Neck: Normal range of motion and phonation normal. Neck supple.  Cardiovascular: Normal rate, regular rhythm, normal heart sounds and intact distal pulses.   Pulmonary/Chest: Effort normal and breath sounds normal. He exhibits no bony tenderness.  Abdominal: Soft. Normal appearance. He exhibits no mass. There is tenderness (Mild midepigastric tenderness without associated rebound tenderness). There is no guarding.  Musculoskeletal: Normal range of motion.  Neurological: He is alert and  oriented to person, place, and time. He has normal strength. No cranial nerve deficit or sensory deficit. He exhibits normal muscle tone. Coordination normal.  Skin: Skin is warm, dry and intact.  Psychiatric: He has a normal mood and affect. His behavior is normal. Judgment and thought content normal.  ED Course  Procedures (including critical care time   Medications  0.9 %  sodium chloride infusion (not administered)  sodium chloride 0.9 % bolus 500 mL (0 mLs Intravenous Stopped 05/23/12 1020)  ondansetron (ZOFRAN) injection 4 mg (4 mg Intravenous Given 05/23/12 1037)  morphine 4 MG/ML injection 2 mg (2 mg Intravenous Given 05/23/12 1036)  ondansetron (ZOFRAN-ODT) disintegrating tablet 8 mg (8 mg Oral Given 05/23/12 1416)      Reevaluation at discharge. He is tolerating oral fluids. Vital signs are reassuring   EKG Date: 12/14/2011  Rate: 57  Rhythm: sinus bradycardia  QRS Axis: normal  PR and QT Intervals: PR prolonged  ST/T Wave abnormalities: normal  PR and QRS Conduction Disutrbances:left bundle branch block  Narrative Interpretation:   Old EKG Reviewed: unchanged   Labs Reviewed  COMPREHENSIVE METABOLIC PANEL - Abnormal; Notable for the following:    Glucose, Bld 131 (*)    BUN 24 (*)    Creatinine, Ser 1.39 (*)    Total Bilirubin 1.3 (*)    GFR calc non Af Amer 45 (*)    GFR calc Af Amer 52 (*)    All other components within normal limits  URINALYSIS, ROUTINE W REFLEX MICROSCOPIC - Abnormal; Notable for the following:    Color, Urine AMBER (*)    All other components within normal limits  CG4 I-STAT (LACTIC ACID) - Abnormal; Notable for the following:    Lactic Acid, Venous 2.74 (*)    All other components within normal limits  LIPASE, BLOOD  POCT I-STAT TROPONIN I   Dg Abd Acute W/chest  05/23/2012  *RADIOLOGY REPORT*  Clinical Data: Hematemesis, shortness of breath  ACUTE ABDOMEN SERIES (ABDOMEN 2 VIEW & CHEST 1 VIEW)  Comparison: 12/23/2010  Findings: The  heart and pulmonary vascularity are within normal limits.  The lungs are clear bilaterally.  Minimal scarring is noted in the right lung base.  Aortic calcifications are seen without aneurysmal dilatation.  The abdomen demonstrates a nonobstructive bowel gas pattern.  No free air is seen.  No abnormal mass or abnormal calcifications are noted.  A degenerative scoliosis of the lumbar spine is noted concave to the left.  Bilateral hip replacements are seen.  IMPRESSION: No acute abnormality is noted.   Original Report Authenticated By: Alcide Clever, M.D.    Nursing Notes Reviewed/ Care Coordinated, and agree without changes. Applicable Imaging Reviewed Interpretation of Laboratory Data incorporated into ED treatment  1. Gastroenteritis       MDM  Evaluation is consistent with nonspecific enteritis, likely viral. Is improved with treatment in the ED, and stable for discharge     Plan: Home Medications- Zofran; Home Treatments- advance diet; Recommended follow up- PCP prn     Flint Melter, MD 05/23/12 [phone]

## 2012-05-23 NOTE — ED Notes (Signed)
Pt from home c/o n/v/d since last night after eating spinach. Pt also reports epigastric pain.

## 2012-05-28 DIAGNOSIS — H4011X0 Primary open-angle glaucoma, stage unspecified: Secondary | ICD-10-CM | POA: Diagnosis not present

## 2012-05-28 DIAGNOSIS — Z961 Presence of intraocular lens: Secondary | ICD-10-CM | POA: Diagnosis not present

## 2012-05-28 DIAGNOSIS — H3530 Unspecified macular degeneration: Secondary | ICD-10-CM | POA: Diagnosis not present

## 2012-05-28 DIAGNOSIS — H348392 Tributary (branch) retinal vein occlusion, unspecified eye, stable: Secondary | ICD-10-CM | POA: Diagnosis not present

## 2012-05-31 DIAGNOSIS — H4011X0 Primary open-angle glaucoma, stage unspecified: Secondary | ICD-10-CM | POA: Diagnosis not present

## 2012-06-06 DIAGNOSIS — M47817 Spondylosis without myelopathy or radiculopathy, lumbosacral region: Secondary | ICD-10-CM | POA: Diagnosis not present

## 2012-06-06 DIAGNOSIS — Z85828 Personal history of other malignant neoplasm of skin: Secondary | ICD-10-CM | POA: Diagnosis not present

## 2012-06-06 DIAGNOSIS — I252 Old myocardial infarction: Secondary | ICD-10-CM | POA: Diagnosis not present

## 2012-06-07 DIAGNOSIS — H4011X0 Primary open-angle glaucoma, stage unspecified: Secondary | ICD-10-CM | POA: Diagnosis not present

## 2012-06-09 DIAGNOSIS — R39198 Other difficulties with micturition: Secondary | ICD-10-CM | POA: Diagnosis not present

## 2012-06-09 DIAGNOSIS — M545 Low back pain: Secondary | ICD-10-CM | POA: Diagnosis not present

## 2012-06-09 DIAGNOSIS — Z85828 Personal history of other malignant neoplasm of skin: Secondary | ICD-10-CM | POA: Diagnosis not present

## 2012-06-09 DIAGNOSIS — N529 Male erectile dysfunction, unspecified: Secondary | ICD-10-CM | POA: Diagnosis not present

## 2012-06-09 DIAGNOSIS — N419 Inflammatory disease of prostate, unspecified: Secondary | ICD-10-CM | POA: Diagnosis not present

## 2012-06-13 DIAGNOSIS — Z85828 Personal history of other malignant neoplasm of skin: Secondary | ICD-10-CM | POA: Diagnosis not present

## 2012-06-13 DIAGNOSIS — I252 Old myocardial infarction: Secondary | ICD-10-CM | POA: Diagnosis not present

## 2012-06-13 DIAGNOSIS — M48061 Spinal stenosis, lumbar region without neurogenic claudication: Secondary | ICD-10-CM | POA: Diagnosis not present

## 2012-06-13 DIAGNOSIS — M47817 Spondylosis without myelopathy or radiculopathy, lumbosacral region: Secondary | ICD-10-CM | POA: Diagnosis not present

## 2012-06-13 DIAGNOSIS — R011 Cardiac murmur, unspecified: Secondary | ICD-10-CM | POA: Diagnosis not present

## 2012-06-24 DIAGNOSIS — L821 Other seborrheic keratosis: Secondary | ICD-10-CM | POA: Diagnosis not present

## 2012-06-24 DIAGNOSIS — Z85828 Personal history of other malignant neoplasm of skin: Secondary | ICD-10-CM | POA: Diagnosis not present

## 2012-06-24 DIAGNOSIS — L819 Disorder of pigmentation, unspecified: Secondary | ICD-10-CM | POA: Diagnosis not present

## 2012-06-24 DIAGNOSIS — L570 Actinic keratosis: Secondary | ICD-10-CM | POA: Diagnosis not present

## 2012-06-24 DIAGNOSIS — D1801 Hemangioma of skin and subcutaneous tissue: Secondary | ICD-10-CM | POA: Diagnosis not present

## 2012-06-25 DIAGNOSIS — R05 Cough: Secondary | ICD-10-CM | POA: Diagnosis not present

## 2012-06-25 DIAGNOSIS — K5901 Slow transit constipation: Secondary | ICD-10-CM | POA: Diagnosis not present

## 2012-07-09 ENCOUNTER — Encounter (INDEPENDENT_AMBULATORY_CARE_PROVIDER_SITE_OTHER): Payer: Self-pay | Admitting: Ophthalmology

## 2012-07-09 ENCOUNTER — Encounter (INDEPENDENT_AMBULATORY_CARE_PROVIDER_SITE_OTHER): Payer: Medicare Other | Admitting: Ophthalmology

## 2012-07-09 DIAGNOSIS — N529 Male erectile dysfunction, unspecified: Secondary | ICD-10-CM | POA: Diagnosis not present

## 2012-07-09 DIAGNOSIS — N401 Enlarged prostate with lower urinary tract symptoms: Secondary | ICD-10-CM | POA: Diagnosis not present

## 2012-07-09 DIAGNOSIS — N419 Inflammatory disease of prostate, unspecified: Secondary | ICD-10-CM | POA: Diagnosis not present

## 2012-07-09 DIAGNOSIS — N139 Obstructive and reflux uropathy, unspecified: Secondary | ICD-10-CM | POA: Diagnosis not present

## 2012-07-09 DIAGNOSIS — N508 Other specified disorders of male genital organs: Secondary | ICD-10-CM | POA: Diagnosis not present

## 2012-07-09 DIAGNOSIS — N402 Nodular prostate without lower urinary tract symptoms: Secondary | ICD-10-CM | POA: Diagnosis not present

## 2012-07-11 ENCOUNTER — Encounter (INDEPENDENT_AMBULATORY_CARE_PROVIDER_SITE_OTHER): Payer: Medicare Other | Admitting: Ophthalmology

## 2012-07-11 DIAGNOSIS — H348392 Tributary (branch) retinal vein occlusion, unspecified eye, stable: Secondary | ICD-10-CM | POA: Diagnosis not present

## 2012-07-11 DIAGNOSIS — I10 Essential (primary) hypertension: Secondary | ICD-10-CM

## 2012-07-11 DIAGNOSIS — H35039 Hypertensive retinopathy, unspecified eye: Secondary | ICD-10-CM

## 2012-07-11 DIAGNOSIS — H3530 Unspecified macular degeneration: Secondary | ICD-10-CM

## 2012-07-11 DIAGNOSIS — H43819 Vitreous degeneration, unspecified eye: Secondary | ICD-10-CM

## 2012-07-17 DIAGNOSIS — H04129 Dry eye syndrome of unspecified lacrimal gland: Secondary | ICD-10-CM | POA: Diagnosis not present

## 2012-07-17 DIAGNOSIS — H4011X0 Primary open-angle glaucoma, stage unspecified: Secondary | ICD-10-CM | POA: Diagnosis not present

## 2012-08-06 DIAGNOSIS — R198 Other specified symptoms and signs involving the digestive system and abdomen: Secondary | ICD-10-CM | POA: Diagnosis not present

## 2012-08-06 DIAGNOSIS — K219 Gastro-esophageal reflux disease without esophagitis: Secondary | ICD-10-CM | POA: Diagnosis not present

## 2012-08-11 DIAGNOSIS — L570 Actinic keratosis: Secondary | ICD-10-CM | POA: Diagnosis not present

## 2012-08-25 DIAGNOSIS — I2510 Atherosclerotic heart disease of native coronary artery without angina pectoris: Secondary | ICD-10-CM | POA: Diagnosis not present

## 2012-08-25 DIAGNOSIS — I4891 Unspecified atrial fibrillation: Secondary | ICD-10-CM | POA: Diagnosis not present

## 2012-08-25 DIAGNOSIS — I059 Rheumatic mitral valve disease, unspecified: Secondary | ICD-10-CM | POA: Diagnosis not present

## 2012-08-25 DIAGNOSIS — I10 Essential (primary) hypertension: Secondary | ICD-10-CM | POA: Diagnosis not present

## 2012-08-26 DIAGNOSIS — I10 Essential (primary) hypertension: Secondary | ICD-10-CM | POA: Diagnosis not present

## 2012-08-26 DIAGNOSIS — I4891 Unspecified atrial fibrillation: Secondary | ICD-10-CM | POA: Diagnosis not present

## 2012-08-26 DIAGNOSIS — I2510 Atherosclerotic heart disease of native coronary artery without angina pectoris: Secondary | ICD-10-CM | POA: Diagnosis not present

## 2012-08-26 DIAGNOSIS — I059 Rheumatic mitral valve disease, unspecified: Secondary | ICD-10-CM | POA: Diagnosis not present

## 2012-09-05 DIAGNOSIS — N529 Male erectile dysfunction, unspecified: Secondary | ICD-10-CM | POA: Diagnosis not present

## 2012-09-05 DIAGNOSIS — M545 Low back pain, unspecified: Secondary | ICD-10-CM | POA: Diagnosis not present

## 2012-09-05 DIAGNOSIS — N401 Enlarged prostate with lower urinary tract symptoms: Secondary | ICD-10-CM | POA: Diagnosis not present

## 2012-09-05 DIAGNOSIS — N419 Inflammatory disease of prostate, unspecified: Secondary | ICD-10-CM | POA: Diagnosis not present

## 2012-09-05 DIAGNOSIS — R39198 Other difficulties with micturition: Secondary | ICD-10-CM | POA: Diagnosis not present

## 2012-09-08 ENCOUNTER — Encounter (INDEPENDENT_AMBULATORY_CARE_PROVIDER_SITE_OTHER): Payer: Medicare Other | Admitting: Ophthalmology

## 2012-09-08 DIAGNOSIS — H348392 Tributary (branch) retinal vein occlusion, unspecified eye, stable: Secondary | ICD-10-CM | POA: Diagnosis not present

## 2012-09-08 DIAGNOSIS — I10 Essential (primary) hypertension: Secondary | ICD-10-CM | POA: Diagnosis not present

## 2012-09-08 DIAGNOSIS — H35039 Hypertensive retinopathy, unspecified eye: Secondary | ICD-10-CM | POA: Diagnosis not present

## 2012-09-08 DIAGNOSIS — H3530 Unspecified macular degeneration: Secondary | ICD-10-CM

## 2012-09-08 DIAGNOSIS — H43819 Vitreous degeneration, unspecified eye: Secondary | ICD-10-CM

## 2012-09-16 DIAGNOSIS — L570 Actinic keratosis: Secondary | ICD-10-CM | POA: Diagnosis not present

## 2012-10-01 DIAGNOSIS — M461 Sacroiliitis, not elsewhere classified: Secondary | ICD-10-CM | POA: Diagnosis not present

## 2012-10-10 DIAGNOSIS — M461 Sacroiliitis, not elsewhere classified: Secondary | ICD-10-CM | POA: Diagnosis not present

## 2012-10-17 DIAGNOSIS — Z85828 Personal history of other malignant neoplasm of skin: Secondary | ICD-10-CM | POA: Diagnosis not present

## 2012-10-17 DIAGNOSIS — I252 Old myocardial infarction: Secondary | ICD-10-CM | POA: Diagnosis not present

## 2012-10-17 DIAGNOSIS — M461 Sacroiliitis, not elsewhere classified: Secondary | ICD-10-CM | POA: Diagnosis not present

## 2012-11-10 DIAGNOSIS — D320 Benign neoplasm of cerebral meninges: Secondary | ICD-10-CM | POA: Diagnosis not present

## 2012-11-17 ENCOUNTER — Encounter (INDEPENDENT_AMBULATORY_CARE_PROVIDER_SITE_OTHER): Payer: Medicare Other | Admitting: Ophthalmology

## 2012-11-17 DIAGNOSIS — H35039 Hypertensive retinopathy, unspecified eye: Secondary | ICD-10-CM | POA: Diagnosis not present

## 2012-11-17 DIAGNOSIS — H348392 Tributary (branch) retinal vein occlusion, unspecified eye, stable: Secondary | ICD-10-CM

## 2012-11-17 DIAGNOSIS — H3530 Unspecified macular degeneration: Secondary | ICD-10-CM

## 2012-11-17 DIAGNOSIS — I10 Essential (primary) hypertension: Secondary | ICD-10-CM | POA: Diagnosis not present

## 2012-11-17 DIAGNOSIS — H43819 Vitreous degeneration, unspecified eye: Secondary | ICD-10-CM

## 2012-11-17 DIAGNOSIS — M5137 Other intervertebral disc degeneration, lumbosacral region: Secondary | ICD-10-CM | POA: Diagnosis not present

## 2012-11-18 ENCOUNTER — Telehealth: Payer: Self-pay | Admitting: Cardiology

## 2012-11-18 NOTE — Telephone Encounter (Signed)
Patient's daughter called, Re: BP elevated at 174/105 after taking Naproxen which was prescribed by PCP for back pain. Pt and family denies any cardiac symptoms, upon recheck BP still 156/97.   Patient is on Lisinopril 10 mg daily as needed for BP. I instructed patient to stop Naproxen starting now and take one dose of Lisinopril.  She is also instructed to recheck BP and at least once a day now. She will call in her cardiologist office for follow up in 1 week to potentially start daily BP medications if BP remains elevated after Naproxen stopped for 1 week.    Instructed pt to call back or go to ER if develops chest pain, persistent HTN or any other symptoms.   Patient and family voiced understanding and questions answered to full satisfaction.   Haydee Salter, MD Cardiology Fellow

## 2012-11-24 DIAGNOSIS — I059 Rheumatic mitral valve disease, unspecified: Secondary | ICD-10-CM | POA: Diagnosis not present

## 2012-11-24 DIAGNOSIS — R079 Chest pain, unspecified: Secondary | ICD-10-CM | POA: Diagnosis not present

## 2012-11-24 DIAGNOSIS — I10 Essential (primary) hypertension: Secondary | ICD-10-CM | POA: Diagnosis not present

## 2012-11-24 DIAGNOSIS — R0602 Shortness of breath: Secondary | ICD-10-CM | POA: Diagnosis not present

## 2012-12-08 DIAGNOSIS — M545 Low back pain: Secondary | ICD-10-CM | POA: Diagnosis not present

## 2012-12-08 DIAGNOSIS — I708 Atherosclerosis of other arteries: Secondary | ICD-10-CM | POA: Diagnosis not present

## 2012-12-08 DIAGNOSIS — Z96649 Presence of unspecified artificial hip joint: Secondary | ICD-10-CM | POA: Diagnosis not present

## 2012-12-08 DIAGNOSIS — I771 Stricture of artery: Secondary | ICD-10-CM | POA: Diagnosis not present

## 2012-12-08 DIAGNOSIS — M47812 Spondylosis without myelopathy or radiculopathy, cervical region: Secondary | ICD-10-CM | POA: Diagnosis not present

## 2012-12-08 DIAGNOSIS — M4120 Other idiopathic scoliosis, site unspecified: Secondary | ICD-10-CM | POA: Diagnosis not present

## 2012-12-08 DIAGNOSIS — M899 Disorder of bone, unspecified: Secondary | ICD-10-CM | POA: Diagnosis not present

## 2012-12-08 DIAGNOSIS — M47817 Spondylosis without myelopathy or radiculopathy, lumbosacral region: Secondary | ICD-10-CM | POA: Diagnosis not present

## 2012-12-08 DIAGNOSIS — I700 Atherosclerosis of aorta: Secondary | ICD-10-CM | POA: Diagnosis not present

## 2012-12-16 DIAGNOSIS — K5900 Constipation, unspecified: Secondary | ICD-10-CM | POA: Diagnosis not present

## 2012-12-16 DIAGNOSIS — D320 Benign neoplasm of cerebral meninges: Secondary | ICD-10-CM | POA: Diagnosis not present

## 2012-12-16 DIAGNOSIS — N400 Enlarged prostate without lower urinary tract symptoms: Secondary | ICD-10-CM | POA: Diagnosis not present

## 2012-12-16 DIAGNOSIS — R3919 Other difficulties with micturition: Secondary | ICD-10-CM | POA: Diagnosis not present

## 2012-12-16 DIAGNOSIS — N529 Male erectile dysfunction, unspecified: Secondary | ICD-10-CM | POA: Diagnosis not present

## 2012-12-16 DIAGNOSIS — R3911 Hesitancy of micturition: Secondary | ICD-10-CM | POA: Diagnosis not present

## 2012-12-16 DIAGNOSIS — N508 Other specified disorders of male genital organs: Secondary | ICD-10-CM | POA: Diagnosis not present

## 2012-12-16 DIAGNOSIS — R339 Retention of urine, unspecified: Secondary | ICD-10-CM | POA: Diagnosis not present

## 2012-12-26 DIAGNOSIS — R293 Abnormal posture: Secondary | ICD-10-CM | POA: Diagnosis not present

## 2012-12-26 DIAGNOSIS — M4800 Spinal stenosis, site unspecified: Secondary | ICD-10-CM | POA: Diagnosis not present

## 2012-12-26 DIAGNOSIS — R262 Difficulty in walking, not elsewhere classified: Secondary | ICD-10-CM | POA: Diagnosis not present

## 2012-12-26 DIAGNOSIS — M539 Dorsopathy, unspecified: Secondary | ICD-10-CM | POA: Diagnosis not present

## 2012-12-28 DIAGNOSIS — Z23 Encounter for immunization: Secondary | ICD-10-CM | POA: Diagnosis not present

## 2012-12-29 DIAGNOSIS — M4800 Spinal stenosis, site unspecified: Secondary | ICD-10-CM | POA: Diagnosis not present

## 2012-12-29 DIAGNOSIS — R262 Difficulty in walking, not elsewhere classified: Secondary | ICD-10-CM | POA: Diagnosis not present

## 2012-12-29 DIAGNOSIS — R293 Abnormal posture: Secondary | ICD-10-CM | POA: Diagnosis not present

## 2013-01-08 DIAGNOSIS — J3089 Other allergic rhinitis: Secondary | ICD-10-CM | POA: Diagnosis not present

## 2013-01-08 DIAGNOSIS — R05 Cough: Secondary | ICD-10-CM | POA: Diagnosis not present

## 2013-01-26 DIAGNOSIS — H52229 Regular astigmatism, unspecified eye: Secondary | ICD-10-CM | POA: Diagnosis not present

## 2013-01-26 DIAGNOSIS — IMO0002 Reserved for concepts with insufficient information to code with codable children: Secondary | ICD-10-CM | POA: Diagnosis not present

## 2013-01-28 ENCOUNTER — Encounter (INDEPENDENT_AMBULATORY_CARE_PROVIDER_SITE_OTHER): Payer: Medicare Other | Admitting: Ophthalmology

## 2013-01-28 DIAGNOSIS — I10 Essential (primary) hypertension: Secondary | ICD-10-CM

## 2013-01-28 DIAGNOSIS — H3530 Unspecified macular degeneration: Secondary | ICD-10-CM | POA: Diagnosis not present

## 2013-01-28 DIAGNOSIS — H348392 Tributary (branch) retinal vein occlusion, unspecified eye, stable: Secondary | ICD-10-CM

## 2013-01-28 DIAGNOSIS — H43819 Vitreous degeneration, unspecified eye: Secondary | ICD-10-CM

## 2013-01-28 DIAGNOSIS — H35039 Hypertensive retinopathy, unspecified eye: Secondary | ICD-10-CM

## 2013-01-30 DIAGNOSIS — R339 Retention of urine, unspecified: Secondary | ICD-10-CM | POA: Diagnosis not present

## 2013-02-02 ENCOUNTER — Other Ambulatory Visit: Payer: Self-pay | Admitting: Interventional Cardiology

## 2013-02-04 ENCOUNTER — Other Ambulatory Visit: Payer: Self-pay | Admitting: Gastroenterology

## 2013-02-04 DIAGNOSIS — R1312 Dysphagia, oropharyngeal phase: Secondary | ICD-10-CM | POA: Diagnosis not present

## 2013-02-04 DIAGNOSIS — R1310 Dysphagia, unspecified: Secondary | ICD-10-CM

## 2013-02-04 DIAGNOSIS — R05 Cough: Secondary | ICD-10-CM | POA: Diagnosis not present

## 2013-02-05 ENCOUNTER — Ambulatory Visit
Admission: RE | Admit: 2013-02-05 | Discharge: 2013-02-05 | Disposition: A | Discharge status: Home / Self Care | Payer: Medicare Other | Source: Ambulatory Visit | Attending: Gastroenterology | Admitting: Gastroenterology

## 2013-02-05 DIAGNOSIS — R1310 Dysphagia, unspecified: Secondary | ICD-10-CM

## 2013-02-05 DIAGNOSIS — K228 Other specified diseases of esophagus: Secondary | ICD-10-CM | POA: Diagnosis not present

## 2013-02-09 ENCOUNTER — Encounter (INDEPENDENT_AMBULATORY_CARE_PROVIDER_SITE_OTHER): Payer: Medicare Other | Admitting: Ophthalmology

## 2013-03-02 DIAGNOSIS — J3489 Other specified disorders of nose and nasal sinuses: Secondary | ICD-10-CM | POA: Diagnosis not present

## 2013-04-28 DIAGNOSIS — N35919 Unspecified urethral stricture, male, unspecified site: Secondary | ICD-10-CM | POA: Diagnosis not present

## 2013-04-28 DIAGNOSIS — N529 Male erectile dysfunction, unspecified: Secondary | ICD-10-CM | POA: Diagnosis not present

## 2013-04-28 DIAGNOSIS — N400 Enlarged prostate without lower urinary tract symptoms: Secondary | ICD-10-CM | POA: Diagnosis not present

## 2013-04-28 DIAGNOSIS — R339 Retention of urine, unspecified: Secondary | ICD-10-CM | POA: Diagnosis not present

## 2013-04-29 ENCOUNTER — Encounter (INDEPENDENT_AMBULATORY_CARE_PROVIDER_SITE_OTHER): Payer: Medicare Other | Admitting: Ophthalmology

## 2013-04-29 ENCOUNTER — Other Ambulatory Visit: Payer: Self-pay

## 2013-04-29 DIAGNOSIS — H3530 Unspecified macular degeneration: Secondary | ICD-10-CM

## 2013-04-29 DIAGNOSIS — D485 Neoplasm of uncertain behavior of skin: Secondary | ICD-10-CM | POA: Diagnosis not present

## 2013-04-29 DIAGNOSIS — H35039 Hypertensive retinopathy, unspecified eye: Secondary | ICD-10-CM | POA: Diagnosis not present

## 2013-04-29 DIAGNOSIS — H43819 Vitreous degeneration, unspecified eye: Secondary | ICD-10-CM

## 2013-04-29 DIAGNOSIS — L570 Actinic keratosis: Secondary | ICD-10-CM | POA: Diagnosis not present

## 2013-04-29 DIAGNOSIS — H348392 Tributary (branch) retinal vein occlusion, unspecified eye, stable: Secondary | ICD-10-CM

## 2013-04-29 DIAGNOSIS — I10 Essential (primary) hypertension: Secondary | ICD-10-CM | POA: Diagnosis not present

## 2013-05-05 DIAGNOSIS — Z96659 Presence of unspecified artificial knee joint: Secondary | ICD-10-CM | POA: Diagnosis not present

## 2013-05-05 DIAGNOSIS — M5137 Other intervertebral disc degeneration, lumbosacral region: Secondary | ICD-10-CM | POA: Diagnosis not present

## 2013-05-05 DIAGNOSIS — Z471 Aftercare following joint replacement surgery: Secondary | ICD-10-CM | POA: Diagnosis not present

## 2013-05-05 DIAGNOSIS — R1310 Dysphagia, unspecified: Secondary | ICD-10-CM | POA: Diagnosis not present

## 2013-05-05 DIAGNOSIS — M545 Low back pain, unspecified: Secondary | ICD-10-CM | POA: Diagnosis not present

## 2013-05-05 DIAGNOSIS — J387 Other diseases of larynx: Secondary | ICD-10-CM | POA: Diagnosis not present

## 2013-05-12 ENCOUNTER — Other Ambulatory Visit: Payer: Self-pay

## 2013-05-12 DIAGNOSIS — C4442 Squamous cell carcinoma of skin of scalp and neck: Secondary | ICD-10-CM | POA: Diagnosis not present

## 2013-05-12 DIAGNOSIS — C4441 Basal cell carcinoma of skin of scalp and neck: Secondary | ICD-10-CM | POA: Diagnosis not present

## 2013-05-19 DIAGNOSIS — M545 Low back pain, unspecified: Secondary | ICD-10-CM | POA: Diagnosis not present

## 2013-05-25 DIAGNOSIS — M545 Low back pain, unspecified: Secondary | ICD-10-CM | POA: Diagnosis not present

## 2013-05-25 DIAGNOSIS — M48061 Spinal stenosis, lumbar region without neurogenic claudication: Secondary | ICD-10-CM | POA: Diagnosis not present

## 2013-05-25 DIAGNOSIS — M47817 Spondylosis without myelopathy or radiculopathy, lumbosacral region: Secondary | ICD-10-CM | POA: Diagnosis not present

## 2013-05-25 DIAGNOSIS — M4120 Other idiopathic scoliosis, site unspecified: Secondary | ICD-10-CM | POA: Diagnosis not present

## 2013-06-01 DIAGNOSIS — M545 Low back pain, unspecified: Secondary | ICD-10-CM | POA: Diagnosis not present

## 2013-06-01 DIAGNOSIS — M4120 Other idiopathic scoliosis, site unspecified: Secondary | ICD-10-CM | POA: Diagnosis not present

## 2013-06-09 DIAGNOSIS — F411 Generalized anxiety disorder: Secondary | ICD-10-CM | POA: Diagnosis not present

## 2013-06-29 ENCOUNTER — Telehealth: Payer: Self-pay | Admitting: Interventional Cardiology

## 2013-06-29 NOTE — Telephone Encounter (Signed)
New Message:  Pt is c/o BP going up and down... trouble sleeping.. Not feeling well.. SOB at times... Pt is currently in [place] and wont be back in town until next week... Pt is requesting to see Dr. Irish Lack asap. Pt had appt on 6/12 but due to a doctors sched change it was canceled. PT is requesting to see Dr. Irish Lack in May. Pt does not want to see PA or NP.Marland KitchenMarland Kitchen

## 2013-07-01 NOTE — Telephone Encounter (Signed)
Spoke with pts nurse who is in [place] with pt. She states she is the one who called and pt is currently sitting by the pool. I told her that I made pt an appt for next Friday the 15th. She will let pt know.

## 2013-07-08 ENCOUNTER — Other Ambulatory Visit (HOSPITAL_COMMUNITY): Payer: Self-pay | Admitting: Gastroenterology

## 2013-07-08 DIAGNOSIS — R1310 Dysphagia, unspecified: Secondary | ICD-10-CM | POA: Diagnosis not present

## 2013-07-08 DIAGNOSIS — R059 Cough, unspecified: Secondary | ICD-10-CM

## 2013-07-08 DIAGNOSIS — R05 Cough: Secondary | ICD-10-CM | POA: Diagnosis not present

## 2013-07-08 DIAGNOSIS — R1312 Dysphagia, oropharyngeal phase: Secondary | ICD-10-CM | POA: Diagnosis not present

## 2013-07-10 ENCOUNTER — Ambulatory Visit (INDEPENDENT_AMBULATORY_CARE_PROVIDER_SITE_OTHER): Payer: Medicare Other | Admitting: Interventional Cardiology

## 2013-07-10 ENCOUNTER — Encounter (INDEPENDENT_AMBULATORY_CARE_PROVIDER_SITE_OTHER): Payer: Self-pay

## 2013-07-10 ENCOUNTER — Encounter: Payer: Self-pay | Admitting: Interventional Cardiology

## 2013-07-10 VITALS — BP 170/96 | HR 58 | Ht 67.0 in | Wt 190.4 lb

## 2013-07-10 DIAGNOSIS — I10 Essential (primary) hypertension: Secondary | ICD-10-CM | POA: Diagnosis not present

## 2013-07-10 DIAGNOSIS — I059 Rheumatic mitral valve disease, unspecified: Secondary | ICD-10-CM | POA: Diagnosis not present

## 2013-07-10 DIAGNOSIS — R05 Cough: Secondary | ICD-10-CM

## 2013-07-10 DIAGNOSIS — R0602 Shortness of breath: Secondary | ICD-10-CM

## 2013-07-10 DIAGNOSIS — R059 Cough, unspecified: Secondary | ICD-10-CM | POA: Diagnosis not present

## 2013-07-10 LAB — BASIC METABOLIC PANEL
BUN: 16 mg/dL (ref 6–23)
CALCIUM: 8.8 mg/dL (ref 8.4–10.5)
CHLORIDE: 104 meq/L (ref 96–112)
CO2: 23 meq/L (ref 19–32)
CREATININE: 1.3 mg/dL (ref 0.4–1.5)
GFR: 56.28 mL/min — ABNORMAL LOW (ref >60.00)
Glucose, Bld: 82 mg/dL (ref 70–99)
Potassium: 4.2 mEq/L (ref 3.5–5.1)
Sodium: 135 mEq/L (ref 135–145)

## 2013-07-10 LAB — BRAIN NATRIURETIC PEPTIDE: Pro B Natriuretic peptide (BNP): 99 pg/mL (ref 0.0–100.0)

## 2013-07-10 MED ORDER — LOSARTAN POTASSIUM 50 MG PO TABS: ORAL_TABLET | ORAL | Status: DC

## 2013-07-10 NOTE — Patient Instructions (Addendum)
Your physician has recommended you make the following change in your medication:   1. Stop Lisinopril.  2. Start Losartan 50 mg 1/2 tablet daily.   Your physician recommends that you return for lab work today for BNP and Bmet.   Your physician wants you to follow-up in: 6 months with Dr. Irish Lack. You will receive a reminder letter in the mail two months in advance. If you don't receive a letter, please call our office to schedule the follow-up appointment.  Your physician has requested that you regularly monitor and record your blood pressure readings at home. Please use the same machine at the same time of day to check your readings and record them.

## 2013-07-10 NOTE — Progress Notes (Signed)
Patient ID: [name], male   DOB: 09/18/1926, 80 y.o.   MRN: [id number]    [address], [place], [place]  [address] Phone: [phone] Fax:  [phone]  Date:  07/10/2013   ID:  TAREQ DWAN, DOB 12/19/1926, MRN [id number]  PCP:  [name], MD      History of Present Illness: [name] is a 80 y.o. male with significant joint problems annd several replacements. He has significant mitral regurgitation. He has trouble sleeping but not related to breathing issues. Mild orthopnea. He is coughing. He has had some upper chest pain on occasion. The cough is dry.  He checks BP at home. [name] He has restricted his salt intake. Range in the 120-130.  He does back exercises. He has not been walking much. His balance is limiting him as well. Atrial Fibrillation F/U:  c/o Shortness of breath. Worse with exertion. Denies : Dizziness.  Leg edema.  Orthopnea.  Palpitations.  Syncope.     Wt Readings from Last 3 Encounters:  07/10/13 190 lb 6.4 oz (86.365 kg)  01/05/11 185 lb (83.915 kg)  01/05/11 185 lb (83.915 kg)     Past Medical History  Diagnosis Date  . Myocardial infarction 1980's- medical intervention  . Acute myocardial infarction 2010    s/p ptca  x2 DE stents  . PAF (paroxysmal atrial fibrillation)     controlled w/ amiodarone  . Sleep apnea     non-compliant cpap  . Heart murmur     mitrial regurg.  [name] Shortness of breath     w/ exertion  . GERD (gastroesophageal reflux disease)     occ. take prevacid  . Headache(784.0)     sinus  . Meningioma right -sided w/ right VI palsy    followed by dr Gaynell Face  . Diverticulitis of colon with bleeding     s/p sigmoid resection '88  . History of GI diverticular bleed april 2012    transfused blood and resolved without surgical intervention  . Impotence, organic     s/p penile prosthesis 1990's  . Coronary artery disease     cardiologist at [facility]- last visit 3 mon ago- requested  note,ekg,echo,stress test  . DJD (degenerative joint disease) hips and knees    s/p bilateral total replacements  . DDD (degenerative disc disease)     chronic back pain  . BPH (benign prostatic hypertrophy) with urinary obstruction     s/p turp yrs ago  . Blood transfusion   . Impaired hearing bilateral     only left hearing aid    Current Outpatient Prescriptions  Medication Sig Dispense Refill  . amiodarone (PACERONE) 200 MG tablet TAKE 1 TABLET BY MOUTH EVERY DAY  30 tablet  11  . clopidogrel (PLAVIX) 75 MG tablet TAKE 1 TABLET BY MOUTH EVERY DAY  30 tablet  11  . famotidine (PEPCID) 10 MG tablet Take 10 mg by mouth daily.      [name] lisinopril (PRINIVIL,ZESTRIL) 5 MG tablet Take 5 mg by mouth daily.      . Multiple Vitamin (MULTIVITAMIN) tablet Take 1 tablet by mouth daily.        . ondansetron (ZOFRAN) 8 MG tablet Take 1 tablet (8 mg total) by mouth every 8 (eight) hours as needed for nausea.  20 tablet  0  . sodium chloride (OCEAN) 0.65 % nasal spray Place 1 spray into the nose as needed for congestion.  No current facility-administered medications for this visit.    Allergies:    Allergies  Allergen Reactions  . Codeine Other (See Comments)    Insomnia/ hyper  . Warfarin Sodium Other (See Comments)    Hx gi bleed    Social History:  The patient  reports that he quit smoking about 52 years ago. He has never used smokeless tobacco. He reports that he does not drink alcohol or use illicit drugs.   Family History:  The patient's family history is not on file.   ROS:  Please see the history of present illness.  No nausea, vomiting.  No fevers, chills.  No focal weakness.  No dysuria. Orthopnea, cough, imbalance.   All other systems reviewed and negative.   PHYSICAL EXAM: VS:  BP 170/96  Pulse 58  Ht 5' 7"  (1.702 m)  Wt 190 lb 6.4 oz (86.365 kg)  BMI 29.81 kg/m2 Well nourished, well developed, in no acute distress HEENT: normal Neck: no JVD, no carotid  bruits Cardiac:  normal S1, S2; RRR; III/VI late systolic murmur Lungs:  clear to auscultation bilaterally, no wheezing, rhonchi or rales Abd: soft, nontender, no hepatomegaly Ext: no edema Skin: warm and dry Neuro:   no focal abnormalities noted      ASSESSMENT AND PLAN:  Breath shortness  LAB: Basic Metabolic LAB: BNP Notes: Unclear if this is cardiac or pulmonary.  No obvious fluid overload.  Could consider diuretic.   Prior labs noted.  May be related to seasonal allergies.  Nurse was concerned about sleep apnea as he does snore.   2. Chest pain  IMAGING: EKG    Stegall,Amy 11/24/2012 11:14:04 AM > Saylor Sheckler,JAY 11/24/2012 11:22:30 AM > sinus bradycardia, LBBB   Resolved.    3. MI (mitral incompetence)  Notes: Significant MR noted in the past.  No evidence of fluid overload.   4. Essential hypertension, benign  Notes: BP has been high. He has been taking lisinopril prn.  Stop lisinopril due to cough.  Start losartan 25 mg daily.  May need to increase.  Nurse to check BP at home daily.            Labs from 9/14:   Lab: Basic Metabolic  GLUCOSE 016 H 01-09 - mg/dL  BUN 17  6-26 - mg/dL  CREATININE 1.19  0.60-1.30 - mg/dl  eGFR (NON-AFRICAN AMERICAN) 58 L >60 - calc  eGFR (AFRICAN AMERICAN) 70  >60 - calc  SODIUM 129 L 136-145 - mmol/L  POTASSIUM 4.4  3.5-5.5 - mmol/L  CHLORIDE 95 L 98-107 - mmol/L  C02 28  22-32 - mmol/L  ANION GAP 10.2  6.0-20.0 - mmol/L  CALCIUM 9.5  8.6-10.3 - mg/dL  BUN/CR ratio 14.3  12.0-20.0 - calc   Adileny Delon,JAY 11/24/2012 04:02:47 PM > kidney funciton stable. Sodium i slow. WOuld change lisinopril HCTZ to lisinopril 10 mg daily. Disp 30, RF 11. Stegall,Amy 11/24/2012 04:08:48 PM > Pt notified. Rx sent in.        Lab: BNP  B-NATRIURETIC PEPTIDE 19  0-100 - pg/mL   Aristea Posada,JAY 11/24/2012 04:02:11 PM > no excess fluid. May be a viral infection. Stegall,Amy 11/24/2012 04:08:24 PM > Pt notified.     Signed, Mina Marble, MD,  [facility] 07/10/2013 8:28 AM

## 2013-07-14 DIAGNOSIS — J387 Other diseases of larynx: Secondary | ICD-10-CM | POA: Diagnosis not present

## 2013-07-14 DIAGNOSIS — R1310 Dysphagia, unspecified: Secondary | ICD-10-CM | POA: Diagnosis not present

## 2013-07-14 DIAGNOSIS — H9190 Unspecified hearing loss, unspecified ear: Secondary | ICD-10-CM | POA: Diagnosis not present

## 2013-07-15 ENCOUNTER — Telehealth: Payer: Self-pay | Admitting: Interventional Cardiology

## 2013-07-15 NOTE — Telephone Encounter (Signed)
New message   ° ° °Patient calling from test results  °

## 2013-07-15 NOTE — Telephone Encounter (Signed)
Advised patient of results. Patient is complaining of swelling in his feet. Patient states he does watch his salt intake and keeps his feet elevated. Will forward to Dr Irish Lack for review   Notes Recorded by Alcario Drought, CMA on 07/14/2013 at 2:49 PM lmtrc ------  Notes Recorded by Jettie Booze, MD on 07/14/2013 at 2:41 PM Kidney function stable. No excess fluid

## 2013-07-22 NOTE — Telephone Encounter (Signed)
Continue current habits.  Consider compression stockings. 20-30 mm Hg

## 2013-07-23 NOTE — Telephone Encounter (Signed)
Last echo 7/14 showing moderate to severe MR.

## 2013-07-23 NOTE — Telephone Encounter (Signed)
Called to follow up and see how patient was doing. Patient states just feels like he has no energy. Patient has a caregiver with him and she did recheck blood pressure 143/82 and heart rate 58. The heart rate of 48 today was the lowest reading patient has had. Normally upper 50's -60's. Will forward to Dr Irish Lack for review.

## 2013-07-23 NOTE — Telephone Encounter (Signed)
Now he is complaining of just not feeling that well and low heart rate. Today heart rate was 48 on his blood pressure monitor. Normally it runs 59-60's. Patient concerned coming from his "leaky valve". Will forward to Amy S CMA and Dr Irish Lack for review   Spoke with patient and he states his swelling is better.

## 2013-07-24 ENCOUNTER — Ambulatory Visit (HOSPITAL_COMMUNITY)
Admission: RE | Admit: 2013-07-24 | Discharge: 2013-07-24 | Disposition: A | Discharge status: Home / Self Care | Payer: Medicare Other | Source: Ambulatory Visit | Attending: Gastroenterology | Admitting: Gastroenterology

## 2013-07-24 DIAGNOSIS — R059 Cough, unspecified: Secondary | ICD-10-CM | POA: Diagnosis not present

## 2013-07-24 DIAGNOSIS — R1310 Dysphagia, unspecified: Secondary | ICD-10-CM | POA: Insufficient documentation

## 2013-07-24 DIAGNOSIS — R05 Cough: Secondary | ICD-10-CM | POA: Insufficient documentation

## 2013-07-24 NOTE — Procedures (Signed)
Objective Swallowing Evaluation: Modified Barium Swallowing Study  Patient Details  Name: [name] MRN: [id number] Date of Birth: [birth year]  Today's Date: 07/24/2013 Time: 1310-1345 SLP Time Calculation (min): 35 min  Past Medical History:  Past Medical History  Diagnosis Date  . Myocardial infarction 1980's- medical intervention  . Acute myocardial infarction 2010    s/p ptca  x2 DE stents  . PAF (paroxysmal atrial fibrillation)     controlled w/ amiodarone  . Sleep apnea     non-compliant cpap  . Heart murmur     mitrial regurg.  [name] Shortness of breath     w/ exertion  . GERD (gastroesophageal reflux disease)     occ. take prevacid  . Headache(784.0)     sinus  . Meningioma right -sided w/ right VI palsy    followed by dr Gaynell Face  . Diverticulitis of colon with bleeding     s/p sigmoid resection '88  . History of GI diverticular bleed april 2012    transfused blood and resolved without surgical intervention  . Impotence, organic     s/p penile prosthesis 1990's  . Coronary artery disease     cardiologist at [facility]- last visit 3 mon ago- requested note,ekg,echo,stress test  . DJD (degenerative joint disease) hips and knees    s/p bilateral total replacements  . DDD (degenerative disc disease)     chronic back pain  . BPH (benign prostatic hypertrophy) with urinary obstruction     s/p turp yrs ago  . Blood transfusion   . Impaired hearing bilateral     only left hearing aid   Past Surgical History:  Past Surgical History  Procedure Laterality Date  . Cholecystectomy    . Joint replacement  03-25-08--total right hip     s/ revision's x2 and sev. closed reduction for dislocation last one 06-20-09  . Joint replacement  2005    total left hip  . Joint replacement  2004    total right knee and revision  . Joint replacement  1997    total left knee  . Hernia repair      bilateral inguinal hernia repair  . Cataract extraction w/ intraocular lens  implant,  bilateral    . Appendectomy    . Transurethral resection of prostate  yrs ago  . Penile prosthesis implant  1990's  . Rotator cuff repair  left  . Right ear surgery  yrs ago  . Cardiac catheterization  2007    noncritical cad (results w/ chart)  . Coronary angioplasty with stent placement  08-03-08    drug-eluting stent x2 distal and mid lad  . Transurethral resection of prostate  01/08/2011    Procedure: TRANSURETHRAL RESECTION OF THE PROSTATE (TURP);  Surgeon: Franchot Gallo;  Location: [facility];  Service: Urology;  Laterality: N/A;  GYRUS OWER   HPI:  80 yo male referred by Dr Cristina Gong for pt complaint of coughing after meals with production of white secretions.  PMH + for recent barium swallow 02/05/2013 showing tiny shallow Zenker's diverticulum that promptly empties, area of subtle narrowing which resulted in transient hang up of 12 mm barium tablet, thoracic esophagus minimal changes of presbyesophagus.   Pt reports he has slowed rate of eating and chews well and this has decreased symptoms.  He denies significant weight loss nor pulmonary infections.      Assessment / Plan / Recommendation Clinical Impression  Clinical impression: Pt with minimal oropharyngeal swallow impairments with mild oral  discoordination/delay in oral transiting with pt compensating by piecemealing and extending head upward without awareness.  Premature spillage to pyriform sinus noted x1 with nectar liquids only.  No aspiration or penetration observed during testing.  Minimal pharyngeal stasis noted with liquids that clear with delayed reflexive swallowing. .  Barium tablet swallowed with thin readily transited through pharynx, esophagus and into stomach.  Pt did not cough - nor expectorate frothy secretions during or after entire testing.  He was observed to belch within 3 minues after MBS stating "you made me swallow all of that stuff".  Cindy, caregiver, present reports pt with frequent  coughing with and without intake and most notably at night when laying down.  Pt reports he will often will sit in recliner and consume honey with small amount of whiskey to ease coughing.  Xerostomia reported by pt and pt uses alcohol based mouthrinse - educated to compensations/alternatives.   Provided pt/caregiver with alternative ways to take medicine if difficult (due to pt hanging up on esophagram in 2014)- eg: with pudding - start and follow with water.  Thanks for this referral.     Treatment Recommendation  No treatment recommended at this time    Diet Recommendation Regular;Thin liquid   Liquid Administration via: Cup Medication Administration: Whole meds with liquid (as tolerated) Supervision: Patient able to self feed Compensations: Slow rate;Small sips/bites Postural Changes and/or Swallow Maneuvers: Seated upright 90 degrees;Upright 30-60 min after meal    Other  Recommendations Oral Care Recommendations: Oral care BID     General Date of Onset: 07/24/13 HPI: 80 yo male referred by Dr Cristina Gong for pt complaint of coughing after meals and dysphagia.  PMH + for recent barium swallow 02/05/2013 showing tiny shallow Zenker's diverticulum that promptly empties, area of subtle narrowing which resulted in transient hang up of 12 mm barium tablet, thoracic esophagus minimal changes of presbyesophagus.   Type of Study: Modified Barium Swallowing Study Reason for Referral: Objectively evaluate swallowing function Diet Prior to this Study: Regular;Thin liquids Temperature Spikes Noted: No Respiratory Status: Room air History of Recent Intubation: No Behavior/Cognition: Alert;Cooperative;Pleasant mood Oral Cavity - Dentition: Adequate natural dentition Oral Motor / Sensory Function: Within functional limits Self-Feeding Abilities: Able to feed self Patient Positioning: Upright in chair Baseline Vocal Quality: Clear Volitional Cough: Strong Volitional Swallow: Able to  elicit Anatomy: Within functional limits Pharyngeal Secretions: Not observed secondary MBS    Reason for Referral Objectively evaluate swallowing function   Oral Phase Oral Preparation/Oral Phase Oral Phase: Impaired Oral - Nectar Oral - Nectar Cup: Piecemeal swallowing;Within functional limits Oral - Thin Oral - Thin Cup: Piecemeal swallowing;Within functional limits Oral - Solids Oral - Puree: Within functional limits;Piecemeal swallowing Oral - Regular: Within functional limits Oral - Pill: Within functional limits Oral Phase - Comment Oral Phase - Comment: pt extends head upward to faciliate oral transiting without awareness, when instructed to hold head neutral - pt was able to complete adequately, piecemeal swallowing effective for pt    Pharyngeal Phase Pharyngeal Phase Pharyngeal Phase: Impaired Pharyngeal - Nectar Pharyngeal - Nectar Cup: Premature spillage to pyriform sinuses;Premature spillage to valleculae (premature spillage x1to pyriform sinus ) Pharyngeal - Thin Pharyngeal - Thin Cup: Within functional limits;Pharyngeal residue - valleculae;Pharyngeal residue - pyriform sinuses (trace residuals effectively cleared with delayed reflexive cough response) Pharyngeal - Solids Pharyngeal - Puree: Within functional limits Pharyngeal - Regular: Within functional limits Pharyngeal - Pill: Within functional limits  Cervical Esophageal Phase    GO  Cervical Esophageal Phase Cervical Esophageal Phase: Impaired Cervical Esophageal Phase - Comment Cervical Esophageal Comment: (pt diagnosed with tiny Zenker's and lower cervical mild luminal narrowing with 12 mm tablet transiently hanging there but clearing with more liquids per esophagram December 2014) -   SLP today did not observe barium tablet stasis and only minimal pharyngeal residuals with liquids that clear with reflexive swallows    Functional Assessment Tool Used: mbs, clinical judgement Functional Limitations:  Swallowing Swallow Current Status (D6222): At least 1 percent but less than 20 percent impaired, limited or restricted Swallow Goal Status [phone]): At least 1 percent but less than 20 percent impaired, limited or restricted Swallow Discharge Status [phone]): At least 1 percent but less than 20 percent impaired, limited or restricted    Luanna Salk, [facility] SLP [phone]

## 2013-07-29 NOTE — Telephone Encounter (Signed)
Vitals stable. COntinue current meds.

## 2013-07-30 NOTE — Telephone Encounter (Signed)
[place] still has some coughing. He does feel a littler better, but still complains of fatigue. He states he checks his BP and it fluctuates a lot. Pt will continue to monitor.

## 2013-08-07 ENCOUNTER — Ambulatory Visit: Payer: Medicare Other | Admitting: Interventional Cardiology

## 2013-08-19 DIAGNOSIS — R1312 Dysphagia, oropharyngeal phase: Secondary | ICD-10-CM | POA: Diagnosis not present

## 2013-08-19 DIAGNOSIS — R059 Cough, unspecified: Secondary | ICD-10-CM | POA: Diagnosis not present

## 2013-08-19 DIAGNOSIS — R05 Cough: Secondary | ICD-10-CM | POA: Diagnosis not present

## 2013-08-24 ENCOUNTER — Telehealth: Payer: Self-pay | Admitting: Interventional Cardiology

## 2013-08-24 ENCOUNTER — Ambulatory Visit: Payer: Medicare Other | Admitting: Interventional Cardiology

## 2013-08-24 DIAGNOSIS — I059 Rheumatic mitral valve disease, unspecified: Secondary | ICD-10-CM

## 2013-08-24 DIAGNOSIS — R0602 Shortness of breath: Secondary | ICD-10-CM

## 2013-08-24 NOTE — Telephone Encounter (Signed)
Spoke with pt and he has had SOB that has persisted since his last OV. He states he just "doesn't feel good" and he is tired all the time. He has SOB with slight exertion. Pt has intermittent CP that is slight pain and he thinks it could be indigestion, but he is not sure. Pt is concerned and worried.

## 2013-08-24 NOTE — Telephone Encounter (Signed)
Per Dr. Irish Lack ok to order Echocardiogram. lmtrc

## 2013-08-24 NOTE — Telephone Encounter (Signed)
Patient is having SOB, he only wants to speak with Amy. Please call and advise.

## 2013-08-25 DIAGNOSIS — L570 Actinic keratosis: Secondary | ICD-10-CM | POA: Diagnosis not present

## 2013-08-25 NOTE — Telephone Encounter (Signed)
Spoke with pt and echo scheduled for 08/31/13. Pt aware.

## 2013-08-31 ENCOUNTER — Ambulatory Visit (HOSPITAL_COMMUNITY)
Admission: RE | Admit: 2013-08-31 | Discharge: 2013-08-31 | Disposition: A | Discharge status: Home / Self Care | Payer: Medicare Other | Source: Ambulatory Visit | Attending: Cardiology | Admitting: Cardiology

## 2013-08-31 DIAGNOSIS — R0602 Shortness of breath: Secondary | ICD-10-CM | POA: Diagnosis not present

## 2013-08-31 DIAGNOSIS — I059 Rheumatic mitral valve disease, unspecified: Secondary | ICD-10-CM | POA: Insufficient documentation

## 2013-08-31 NOTE — Progress Notes (Signed)
2D Echo Performed 08/31/2013    Marygrace Drought, RCS

## 2013-09-03 ENCOUNTER — Encounter: Payer: Self-pay | Admitting: Cardiology

## 2013-09-03 ENCOUNTER — Encounter: Payer: Self-pay | Admitting: Interventional Cardiology

## 2013-09-03 ENCOUNTER — Other Ambulatory Visit: Payer: Self-pay | Admitting: Interventional Cardiology

## 2013-09-03 ENCOUNTER — Telehealth: Payer: Self-pay | Admitting: Interventional Cardiology

## 2013-09-03 ENCOUNTER — Ambulatory Visit (INDEPENDENT_AMBULATORY_CARE_PROVIDER_SITE_OTHER): Payer: Medicare Other | Admitting: Interventional Cardiology

## 2013-09-03 VITALS — BP 130/68 | HR 59 | Ht 67.0 in | Wt 185.0 lb

## 2013-09-03 DIAGNOSIS — I25119 Atherosclerotic heart disease of native coronary artery with unspecified angina pectoris: Secondary | ICD-10-CM

## 2013-09-03 DIAGNOSIS — I10 Essential (primary) hypertension: Secondary | ICD-10-CM

## 2013-09-03 DIAGNOSIS — I209 Angina pectoris, unspecified: Secondary | ICD-10-CM

## 2013-09-03 DIAGNOSIS — R9430 Abnormal result of cardiovascular function study, unspecified: Secondary | ICD-10-CM | POA: Diagnosis not present

## 2013-09-03 DIAGNOSIS — I2510 Atherosclerotic heart disease of native coronary artery without angina pectoris: Secondary | ICD-10-CM | POA: Insufficient documentation

## 2013-09-03 DIAGNOSIS — R0602 Shortness of breath: Secondary | ICD-10-CM | POA: Diagnosis not present

## 2013-09-03 DIAGNOSIS — I059 Rheumatic mitral valve disease, unspecified: Secondary | ICD-10-CM

## 2013-09-03 NOTE — Progress Notes (Signed)
Patient ID: [name], male   DOB: 08/30/1926, 80 y.o.   MRN: [id number]    [address], [address] [place], [place]  [address] Phone: [phone] Fax:  [phone]  Date:  09/03/2013   ID:  Beverley V Kinslow, DOB 09/17/1926, MRN [id number]  PCP:  JOHNSON,MARTIN K, MD      History of Present Illness: Luciano V Brozek is a 80 y.o. male with significant joint problems annd several replacements. He has significant mitral regurgitation. He has trouble sleeping but not related to breathing issues. Mild orthopnea. He is coughing. He has had some upper chest pain on occasion. The cough is dry.  He checks BP at home. . He has restricted his salt intake. Range in the 120-130.  He does back exercises. He has not been walking much. His balance is limiting him as well. Atrial Fibrillation F/U:  c/o Shortness of breath. Worse with exertion. Denies : Dizziness.  Leg edema.  Orthopnea.  Palpitations.  Syncope.   Echocardiogram was done and showed significant decrease in his ejection fraction. His EF is now 35%. It was also noted that his prior angina was shortness of breath rather than chest pain.  He is here today to discuss cardiac catheterization.  Wt Readings from Last 3 Encounters:  09/03/13 185 lb (83.915 kg)  07/10/13 190 lb 6.4 oz (86.365 kg)  01/05/11 185 lb (83.915 kg)     Past Medical History  Diagnosis Date  . Myocardial infarction 1980's- medical intervention  . Acute myocardial infarction 2010    s/p ptca  x2 DE stents  . PAF (paroxysmal atrial fibrillation)     controlled w/ amiodarone  . Sleep apnea     non-compliant cpap  . Heart murmur     mitrial regurg.  . Shortness of breath     w/ exertion  . GERD (gastroesophageal reflux disease)     occ. take prevacid  . Headache(784.0)     sinus  . Meningioma right -sided w/ right VI palsy    followed by dr hickling  . Diverticulitis of colon with bleeding     s/p sigmoid resection '88  . History of GI  diverticular bleed april 2012    transfused blood and resolved without surgical intervention  . Impotence, organic     s/p penile prosthesis 1990's  . Coronary artery disease     cardiologist at [facility]- last visit 3 mon ago- requested note,ekg,echo,stress test  . DJD (degenerative joint disease) hips and knees    s/p bilateral total replacements  . DDD (degenerative disc disease)     chronic back pain  . BPH (benign prostatic hypertrophy) with urinary obstruction     s/p turp yrs ago  . Blood transfusion   . Impaired hearing bilateral     only left hearing aid    Current Outpatient Prescriptions  Medication Sig Dispense Refill  . amiodarone (PACERONE) 200 MG tablet TAKE 1 TABLET BY MOUTH EVERY DAY  30 tablet  11  . clopidogrel (PLAVIX) 75 MG tablet TAKE 1 TABLET BY MOUTH EVERY DAY  30 tablet  11  . famotidine (PEPCID) 10 MG tablet Take 10 mg by mouth daily.      . losartan (COZAAR) 50 MG tablet 1/2 tablet daily or as directed  30 tablet  6  . Multiple Vitamin (MULTIVITAMIN) tablet Take 1 tablet by mouth daily.        . sodium chloride (OCEAN) 0.65 % nasal spray Place 1 spray   into the nose as needed for congestion.       No current facility-administered medications for this visit.    Allergies:    Allergies  Allergen Reactions  . Codeine Other (See Comments)    Insomnia/ hyper  . Warfarin Sodium Other (See Comments)    Hx gi bleed    Social History:  The patient  reports that he quit smoking about 52 years ago. He has never used smokeless tobacco. He reports that he does not drink alcohol or use illicit drugs.   Family History:  The patient's family history includes Heart disease in his mother.   ROS:  Please see the history of present illness.  No nausea, vomiting.  No fevers, chills.  No focal weakness.  No dysuria. Orthopnea, cough, imbalance.   All other systems reviewed and negative.   PHYSICAL EXAM: VS:  BP 130/68  Pulse 59  Ht 5' 7" (1.702 m)  Wt 185 lb (83.915  kg)  BMI 28.97 kg/m2 Well nourished, well developed, in no acute distress HEENT: normal Neck: no JVD, no carotid bruits Cardiac:  normal S1, S2; RRR; III/VI late systolic murmur Lungs:  clear to auscultation bilaterally, no wheezing, rhonchi or rales Abd: soft, nontender, no hepatomegaly Ext: no edema, 3+ right radial pulse Skin: warm and dry Neuro:   no focal abnormalities noted      ASSESSMENT AND PLAN:  Breath shortness  LAB: Basic Metabolic LAB: BNP Notes: Since prior angina was shortness of breath, and given decreased ejection fraction, We'll plan for cardiac cath. The risks and benefits of the procedure were explained to the patient.  BNP was 99 in May. Symptoms had gotten worse. We'll recheck BNP.   2. Chest pain          has occurred again. Evaluate with cardiac cath. Known CAD. Stents to the LAD in 2010 including rotational atherectomy.    3. MI (mitral incompetence)  Notes: Significant MR noted in the past.  No evidence of fluid overload.  shortness of breath. Only mild mitral regurgitation noted on most recent echocardiogram.    4. Essential hypertension, benign    Stopped lisinopril due to cough.  Start losartan 25 mg daily.  Controlled .  Creatinine 1.3 in May 2015.             Labs from 9/14:   Lab: Basic Metabolic  GLUCOSE 110 H 70-99 - mg/dL  BUN 17  6-26 - mg/dL  CREATININE 1.19  0.60-1.30 - mg/dl  eGFR (NON-AFRICAN AMERICAN) 58 L >60 - calc  eGFR (AFRICAN AMERICAN) 70  >60 - calc  SODIUM 129 L 136-145 - mmol/L  POTASSIUM 4.4  3.5-5.5 - mmol/L  CHLORIDE 95 L 98-107 - mmol/L  C02 28  22-32 - mmol/L  ANION GAP 10.2  6.0-20.0 - mmol/L  CALCIUM 9.5  8.6-10.3 - mg/dL  BUN/CR ratio 14.3  12.0-20.0 - calc   Raianna Slight,JAY 11/24/2012 04:02:47 PM > kidney funciton stable. Sodium i slow. WOuld change lisinopril HCTZ to lisinopril 10 mg daily. Disp 30, RF 11. Stegall,Amy 11/24/2012 04:08:48 PM > Pt notified. Rx sent in.        Lab: BNP  B-NATRIURETIC  PEPTIDE 19  0-100 - pg/mL   Devlyn Retter,JAY 11/24/2012 04:02:11 PM > no excess fluid. May be a viral infection. Stegall,Amy 11/24/2012 04:08:24 PM > Pt notified.     Signed, Jay S. Stephenson Cichy, MD, FACC 09/03/2013 5:00 PM   

## 2013-09-03 NOTE — Patient Instructions (Signed)
Your physician has requested that you have a cardiac catheterization. Cardiac catheterization is used to diagnose and/or treat various heart conditions. Doctors may recommend this procedure for a number of different reasons. The most common reason is to evaluate chest pain. Chest pain can be a symptom of coronary artery disease (CAD), and cardiac catheterization can show whether plaque is narrowing or blocking your heart's arteries. This procedure is also used to evaluate the valves, as well as measure the blood flow and oxygen levels in different parts of your heart. For further information please visit [email or web address]. Please follow instruction sheet, as given.  Your physician recommends that you return for lab work today for BNP, BMET, CBC with diff, and PT/INR.

## 2013-09-03 NOTE — Telephone Encounter (Signed)
Patient is returning your call. Please call back.  °

## 2013-09-03 NOTE — Telephone Encounter (Signed)
Returned pts call. See Echo results.

## 2013-09-04 ENCOUNTER — Encounter (HOSPITAL_COMMUNITY)
Admission: RE | Disposition: A | Discharge status: Home / Self Care | Payer: Self-pay | Source: Ambulatory Visit | Attending: Interventional Cardiology

## 2013-09-04 ENCOUNTER — Observation Stay (HOSPITAL_COMMUNITY)
Admission: RE | Admit: 2013-09-04 | Discharge: 2013-09-05 | Disposition: A | Discharge status: Home / Self Care | Payer: Medicare Other | Source: Ambulatory Visit | Attending: Interventional Cardiology | Admitting: Interventional Cardiology

## 2013-09-04 ENCOUNTER — Encounter (HOSPITAL_COMMUNITY): Payer: Self-pay | Admitting: Interventional Cardiology

## 2013-09-04 DIAGNOSIS — I25119 Atherosclerotic heart disease of native coronary artery with unspecified angina pectoris: Secondary | ICD-10-CM

## 2013-09-04 DIAGNOSIS — Z7902 Long term (current) use of antithrombotics/antiplatelets: Secondary | ICD-10-CM | POA: Insufficient documentation

## 2013-09-04 DIAGNOSIS — I440 Atrioventricular block, first degree: Secondary | ICD-10-CM | POA: Diagnosis not present

## 2013-09-04 DIAGNOSIS — I2510 Atherosclerotic heart disease of native coronary artery without angina pectoris: Secondary | ICD-10-CM | POA: Diagnosis not present

## 2013-09-04 DIAGNOSIS — I498 Other specified cardiac arrhythmias: Secondary | ICD-10-CM | POA: Diagnosis not present

## 2013-09-04 DIAGNOSIS — I10 Essential (primary) hypertension: Secondary | ICD-10-CM

## 2013-09-04 DIAGNOSIS — G4730 Sleep apnea, unspecified: Secondary | ICD-10-CM | POA: Diagnosis not present

## 2013-09-04 DIAGNOSIS — K5730 Diverticulosis of large intestine without perforation or abscess without bleeding: Secondary | ICD-10-CM | POA: Insufficient documentation

## 2013-09-04 DIAGNOSIS — I4891 Unspecified atrial fibrillation: Secondary | ICD-10-CM | POA: Insufficient documentation

## 2013-09-04 DIAGNOSIS — N138 Other obstructive and reflux uropathy: Secondary | ICD-10-CM | POA: Insufficient documentation

## 2013-09-04 DIAGNOSIS — Z96659 Presence of unspecified artificial knee joint: Secondary | ICD-10-CM | POA: Diagnosis not present

## 2013-09-04 DIAGNOSIS — Z87891 Personal history of nicotine dependence: Secondary | ICD-10-CM | POA: Insufficient documentation

## 2013-09-04 DIAGNOSIS — H9190 Unspecified hearing loss, unspecified ear: Secondary | ICD-10-CM | POA: Diagnosis not present

## 2013-09-04 DIAGNOSIS — Y9210 Unspecified residential institution as the place of occurrence of the external cause: Secondary | ICD-10-CM | POA: Insufficient documentation

## 2013-09-04 DIAGNOSIS — I428 Other cardiomyopathies: Secondary | ICD-10-CM | POA: Diagnosis not present

## 2013-09-04 DIAGNOSIS — T148XXA Other injury of unspecified body region, initial encounter: Secondary | ICD-10-CM

## 2013-09-04 DIAGNOSIS — Z96649 Presence of unspecified artificial hip joint: Secondary | ICD-10-CM | POA: Diagnosis not present

## 2013-09-04 DIAGNOSIS — Z9049 Acquired absence of other specified parts of digestive tract: Secondary | ICD-10-CM | POA: Insufficient documentation

## 2013-09-04 DIAGNOSIS — R0989 Other specified symptoms and signs involving the circulatory and respiratory systems: Secondary | ICD-10-CM | POA: Insufficient documentation

## 2013-09-04 DIAGNOSIS — K219 Gastro-esophageal reflux disease without esophagitis: Secondary | ICD-10-CM | POA: Diagnosis not present

## 2013-09-04 DIAGNOSIS — G8929 Other chronic pain: Secondary | ICD-10-CM | POA: Insufficient documentation

## 2013-09-04 DIAGNOSIS — IMO0002 Reserved for concepts with insufficient information to code with codable children: Secondary | ICD-10-CM | POA: Insufficient documentation

## 2013-09-04 DIAGNOSIS — Z8249 Family history of ischemic heart disease and other diseases of the circulatory system: Secondary | ICD-10-CM | POA: Insufficient documentation

## 2013-09-04 DIAGNOSIS — N401 Enlarged prostate with lower urinary tract symptoms: Secondary | ICD-10-CM | POA: Insufficient documentation

## 2013-09-04 DIAGNOSIS — R001 Bradycardia, unspecified: Secondary | ICD-10-CM | POA: Diagnosis present

## 2013-09-04 DIAGNOSIS — R0609 Other forms of dyspnea: Secondary | ICD-10-CM | POA: Insufficient documentation

## 2013-09-04 DIAGNOSIS — I209 Angina pectoris, unspecified: Secondary | ICD-10-CM | POA: Diagnosis not present

## 2013-09-04 DIAGNOSIS — I447 Left bundle-branch block, unspecified: Secondary | ICD-10-CM | POA: Diagnosis not present

## 2013-09-04 DIAGNOSIS — N139 Obstructive and reflux uropathy, unspecified: Secondary | ICD-10-CM | POA: Diagnosis not present

## 2013-09-04 DIAGNOSIS — N529 Male erectile dysfunction, unspecified: Secondary | ICD-10-CM | POA: Insufficient documentation

## 2013-09-04 DIAGNOSIS — I059 Rheumatic mitral valve disease, unspecified: Secondary | ICD-10-CM | POA: Diagnosis not present

## 2013-09-04 DIAGNOSIS — I252 Old myocardial infarction: Secondary | ICD-10-CM | POA: Insufficient documentation

## 2013-09-04 DIAGNOSIS — Y840 Cardiac catheterization as the cause of abnormal reaction of the patient, or of later complication, without mention of misadventure at the time of the procedure: Secondary | ICD-10-CM | POA: Insufficient documentation

## 2013-09-04 HISTORY — DX: Cardiomyopathy, unspecified: I42.9

## 2013-09-04 HISTORY — DX: Bradycardia, unspecified: R00.1

## 2013-09-04 HISTORY — DX: Nonrheumatic mitral (valve) insufficiency: I34.0

## 2013-09-04 HISTORY — DX: Atherosclerotic heart disease of native coronary artery without angina pectoris: I25.10

## 2013-09-04 HISTORY — PX: LEFT HEART CATHETERIZATION WITH CORONARY ANGIOGRAM: SHX5451

## 2013-09-04 LAB — CBC WITH DIFFERENTIAL/PLATELET
BASOS ABS: 0 10*3/uL (ref 0.0–0.1)
BASOS PCT: 0.4 % (ref 0.0–3.0)
EOS ABS: 0.3 10*3/uL (ref 0.0–0.7)
Eosinophils Relative: 4 % (ref 0.0–5.0)
HCT: 42 % (ref 39.0–52.0)
Hemoglobin: 14.2 g/dL (ref 13.0–17.0)
LYMPHS PCT: 24.9 % (ref 12.0–46.0)
Lymphs Abs: 1.8 10*3/uL (ref 0.7–4.0)
MCHC: 33.8 g/dL (ref 30.0–36.0)
MCV: 94.5 fl (ref 78.0–100.0)
MONO ABS: 1 10*3/uL (ref 0.1–1.0)
Monocytes Relative: 14.3 % — ABNORMAL HIGH (ref 3.0–12.0)
NEUTROS PCT: 56.4 % (ref 43.0–77.0)
Neutro Abs: 4.1 10*3/uL (ref 1.4–7.7)
PLATELETS: 175 10*3/uL (ref 150.0–400.0)
RBC: 4.44 Mil/uL (ref 4.22–5.81)
RDW: 14.4 % (ref 11.5–15.5)
WBC: 7.2 10*3/uL (ref 4.0–10.5)

## 2013-09-04 LAB — BASIC METABOLIC PANEL
BUN: 20 mg/dL (ref 6–23)
CALCIUM: 8.9 mg/dL (ref 8.4–10.5)
CHLORIDE: 99 meq/L (ref 96–112)
CO2: 25 meq/L (ref 19–32)
CREATININE: 1.3 mg/dL (ref 0.4–1.5)
GFR: 56.26 mL/min — ABNORMAL LOW (ref >60.00)
GLUCOSE: 102 mg/dL — AB (ref 70–99)
Potassium: 4.3 mEq/L (ref 3.5–5.1)
Sodium: 131 mEq/L — ABNORMAL LOW (ref 135–145)

## 2013-09-04 LAB — PROTIME-INR
INR: 1.1 (ref 0.00–1.49)
PROTHROMBIN TIME: 14.2 s (ref 11.6–15.2)

## 2013-09-04 LAB — BRAIN NATRIURETIC PEPTIDE: PRO B NATRI PEPTIDE: 39 pg/mL (ref 0.0–100.0)

## 2013-09-04 LAB — POCT ACTIVATED CLOTTING TIME: Activated Clotting Time: 242 seconds

## 2013-09-04 SURGERY — LEFT HEART CATHETERIZATION WITH CORONARY ANGIOGRAM
Anesthesia: LOCAL

## 2013-09-04 MED ORDER — HEPARIN (PORCINE) IN NACL 2-0.9 UNIT/ML-% IJ SOLN
INTRAMUSCULAR | Status: AC
  Filled 2013-09-04: qty 1000

## 2013-09-04 MED ORDER — SODIUM CHLORIDE 0.9 % IJ SOLN
3.0000 mL | INTRAMUSCULAR | Status: DC | PRN
  Administered 2013-09-04: 3 mL via INTRAVENOUS

## 2013-09-04 MED ORDER — CLOPIDOGREL BISULFATE 75 MG PO TABS
75.0000 mg | ORAL_TABLET | Freq: Every day | ORAL | Status: DC
  Administered 2013-09-05: 10:00:00 75 mg via ORAL
  Filled 2013-09-04: qty 1

## 2013-09-04 MED ORDER — MORPHINE SULFATE 2 MG/ML IJ SOLN
1.0000 mg | INTRAMUSCULAR | Status: DC | PRN
  Administered 2013-09-04: 2 mg via INTRAVENOUS
  Filled 2013-09-04: qty 1

## 2013-09-04 MED ORDER — SODIUM CHLORIDE 0.9 % IV SOLN: 250.0000 mL | INTRAVENOUS | Status: DC | PRN

## 2013-09-04 MED ORDER — ADENOSINE 12 MG/4ML IV SOLN
16.0000 mL | Freq: Once | INTRAVENOUS | Status: DC
  Filled 2013-09-04: qty 16

## 2013-09-04 MED ORDER — HEPARIN SODIUM (PORCINE) 1000 UNIT/ML IJ SOLN
INTRAMUSCULAR | Status: AC
  Filled 2013-09-04: qty 1

## 2013-09-04 MED ORDER — FENTANYL CITRATE 0.05 MG/ML IJ SOLN
INTRAMUSCULAR | Status: AC
  Filled 2013-09-04: qty 2

## 2013-09-04 MED ORDER — MORPHINE SULFATE 10 MG/ML IJ SOLN: 1.0000 mg | INTRAMUSCULAR | Status: DC | PRN

## 2013-09-04 MED ORDER — SALINE SPRAY 0.65 % NA SOLN
1.0000 | NASAL | Status: DC | PRN
  Filled 2013-09-04: qty 44

## 2013-09-04 MED ORDER — NITROGLYCERIN 0.2 MG/ML ON CALL CATH LAB
INTRAVENOUS | Status: AC
  Filled 2013-09-04: qty 1

## 2013-09-04 MED ORDER — SODIUM CHLORIDE 0.9 % IV SOLN: 1.0000 mL/kg/h | INTRAVENOUS | Status: DC

## 2013-09-04 MED ORDER — ASPIRIN 81 MG PO CHEW
81.0000 mg | CHEWABLE_TABLET | ORAL | Status: AC
  Administered 2013-09-04: 81 mg via ORAL

## 2013-09-04 MED ORDER — ATROPINE SULFATE 0.1 MG/ML IJ SOLN
INTRAMUSCULAR | Status: AC
  Filled 2013-09-04: qty 10

## 2013-09-04 MED ORDER — SODIUM CHLORIDE 0.9 % IJ SOLN: 3.0000 mL | Freq: Two times a day (BID) | INTRAMUSCULAR | Status: DC

## 2013-09-04 MED ORDER — DIAZEPAM 5 MG PO TABS
ORAL_TABLET | ORAL | Status: AC
  Administered 2013-09-04: 2.5 mg via ORAL
  Filled 2013-09-04: qty 1

## 2013-09-04 MED ORDER — MIDAZOLAM HCL 2 MG/2ML IJ SOLN
INTRAMUSCULAR | Status: AC
  Filled 2013-09-04: qty 2

## 2013-09-04 MED ORDER — MORPHINE SULFATE 2 MG/ML IJ SOLN
INTRAMUSCULAR | Status: AC
  Administered 2013-09-04: 2 mg via INTRAVENOUS
  Filled 2013-09-04: qty 1

## 2013-09-04 MED ORDER — VERAPAMIL HCL 2.5 MG/ML IV SOLN
INTRAVENOUS | Status: AC
  Filled 2013-09-04: qty 2

## 2013-09-04 MED ORDER — LIDOCAINE HCL (PF) 1 % IJ SOLN
INTRAMUSCULAR | Status: AC
  Filled 2013-09-04: qty 30

## 2013-09-04 MED ORDER — DIAZEPAM 5 MG PO TABS
2.5000 mg | ORAL_TABLET | Freq: Once | ORAL | Status: AC
  Administered 2013-09-04: 2.5 mg via ORAL

## 2013-09-04 MED ORDER — SODIUM CHLORIDE 0.9 % IV SOLN: 1.0000 mL/kg/h | INTRAVENOUS | Status: AC

## 2013-09-04 MED ORDER — SALINE NASAL SPRAY 0.65 % NA SOLN: 1.0000 | NASAL | Status: DC | PRN

## 2013-09-04 MED ORDER — SODIUM CHLORIDE 0.9 % IV SOLN
INTRAVENOUS | Status: DC
  Administered 2013-09-04: 11:00:00 via INTRAVENOUS

## 2013-09-04 MED ORDER — DIAZEPAM 2 MG PO TABS: 2.0000 mg | ORAL_TABLET | Freq: Once | ORAL | Status: DC

## 2013-09-04 MED ORDER — FAMOTIDINE 10 MG PO TABS
10.0000 mg | ORAL_TABLET | Freq: Every day | ORAL | Status: DC
  Administered 2013-09-05: 10 mg via ORAL
  Filled 2013-09-04: qty 1

## 2013-09-04 MED ORDER — DOPAMINE-DEXTROSE 3.2-5 MG/ML-% IV SOLN
INTRAVENOUS | Status: AC
  Filled 2013-09-04: qty 250

## 2013-09-04 MED ORDER — ATROPINE SULFATE 0.1 MG/ML IJ SOLN
INTRAMUSCULAR | Status: AC
  Administered 2013-09-04: 1 mg
  Filled 2013-09-04: qty 10

## 2013-09-04 MED ORDER — ASPIRIN 81 MG PO CHEW
CHEWABLE_TABLET | ORAL | Status: AC
  Filled 2013-09-04: qty 1

## 2013-09-04 NOTE — Progress Notes (Signed)
Manual pressure completed again, dressing applied. Bed assignment received, report called to [facility] on [address]. Pt transported via stretcher without incident.

## 2013-09-04 NOTE — Progress Notes (Signed)
HR dropped into 20's. Atropine given. Pt connected to heart monitor with monitor pattern of SB noted. O2 at 4 LPM via [place] applied and pt reclined back in recliner. Pt c/o abd pain.

## 2013-09-04 NOTE — H&P (View-Only) (Signed)
Patient ID: [name], male   DOB: Jun 23, 1926, 80 y.o.   MRN: [id number]    [address], [address] [place], [place]  [address] Phone: [phone] Fax:  [phone]  Date:  09/03/2013   ID:  [name], DOB 18-Dec-1926, MRN [id number]  PCP:  [name], MD      History of Present Illness: [name] is a 80 y.o. male with significant joint problems annd several replacements. He has significant mitral regurgitation. He has trouble sleeping but not related to breathing issues. Mild orthopnea. He is coughing. He has had some upper chest pain on occasion. The cough is dry.  He checks BP at home. [name] He has restricted his salt intake. Range in the 120-130.  He does back exercises. He has not been walking much. His balance is limiting him as well. Atrial Fibrillation F/U:  c/o Shortness of breath. Worse with exertion. Denies : Dizziness.  Leg edema.  Orthopnea.  Palpitations.  Syncope.   Echocardiogram was done and showed significant decrease in his ejection fraction. His EF is now 35%. It was also noted that his prior angina was shortness of breath rather than chest pain.  He is here today to discuss cardiac catheterization.  Wt Readings from Last 3 Encounters:  09/03/13 185 lb (83.915 kg)  07/10/13 190 lb 6.4 oz (86.365 kg)  01/05/11 185 lb (83.915 kg)     Past Medical History  Diagnosis Date  . Myocardial infarction 1980's- medical intervention  . Acute myocardial infarction 2010    s/p ptca  x2 DE stents  . PAF (paroxysmal atrial fibrillation)     controlled w/ amiodarone  . Sleep apnea     non-compliant cpap  . Heart murmur     mitrial regurg.  [name] Shortness of breath     w/ exertion  . GERD (gastroesophageal reflux disease)     occ. take prevacid  . Headache(784.0)     sinus  . Meningioma right -sided w/ right VI palsy    followed by dr Gaynell Face  . Diverticulitis of colon with bleeding     s/p sigmoid resection '88  . History of GI  diverticular bleed april 2012    transfused blood and resolved without surgical intervention  . Impotence, organic     s/p penile prosthesis 1990's  . Coronary artery disease     cardiologist at [facility]- last visit 3 mon ago- requested note,ekg,echo,stress test  . DJD (degenerative joint disease) hips and knees    s/p bilateral total replacements  . DDD (degenerative disc disease)     chronic back pain  . BPH (benign prostatic hypertrophy) with urinary obstruction     s/p turp yrs ago  . Blood transfusion   . Impaired hearing bilateral     only left hearing aid    Current Outpatient Prescriptions  Medication Sig Dispense Refill  . amiodarone (PACERONE) 200 MG tablet TAKE 1 TABLET BY MOUTH EVERY DAY  30 tablet  11  . clopidogrel (PLAVIX) 75 MG tablet TAKE 1 TABLET BY MOUTH EVERY DAY  30 tablet  11  . famotidine (PEPCID) 10 MG tablet Take 10 mg by mouth daily.      [name] losartan (COZAAR) 50 MG tablet 1/2 tablet daily or as directed  30 tablet  6  . Multiple Vitamin (MULTIVITAMIN) tablet Take 1 tablet by mouth daily.        . sodium chloride (OCEAN) 0.65 % nasal spray Place 1 spray  into the nose as needed for congestion.       No current facility-administered medications for this visit.    Allergies:    Allergies  Allergen Reactions  . Codeine Other (See Comments)    Insomnia/ hyper  . Warfarin Sodium Other (See Comments)    Hx gi bleed    Social History:  The patient  reports that he quit smoking about 52 years ago. He has never used smokeless tobacco. He reports that he does not drink alcohol or use illicit drugs.   Family History:  The patient's family history includes Heart disease in his mother.   ROS:  Please see the history of present illness.  No nausea, vomiting.  No fevers, chills.  No focal weakness.  No dysuria. Orthopnea, cough, imbalance.   All other systems reviewed and negative.   PHYSICAL EXAM: VS:  BP 130/68  Pulse 59  Ht _0  (1.702 m)  Wt 185 lb (83.915  kg)  BMI 28.97 kg/m2 Well nourished, well developed, in no acute distress HEENT: normal Neck: no JVD, no carotid bruits Cardiac:  normal S1, S2; RRR; III/VI late systolic murmur Lungs:  clear to auscultation bilaterally, no wheezing, rhonchi or rales Abd: soft, nontender, no hepatomegaly Ext: no edema, 3+ right radial pulse Skin: warm and dry Neuro:   no focal abnormalities noted      ASSESSMENT AND PLAN:  Breath shortness  LAB: Basic Metabolic LAB: BNP Notes: Since prior angina was shortness of breath, and given decreased ejection fraction, We'll plan for cardiac cath. The risks and benefits of the procedure were explained to the patient.  BNP was 99 in May. Symptoms had gotten worse. We'll recheck BNP.   2. Chest pain          has occurred again. Evaluate with cardiac cath. Known CAD. Stents to the LAD in 2010 including rotational atherectomy.    3. MI (mitral incompetence)  Notes: Significant MR noted in the past.  No evidence of fluid overload.  shortness of breath. Only mild mitral regurgitation noted on most recent echocardiogram.    4. Essential hypertension, benign    Stopped lisinopril due to cough.  Start losartan 25 mg daily.  Controlled .  Creatinine 1.3 in May 2015.             Labs from 9/14:   Lab: Basic Metabolic  GLUCOSE 003 H 49-17 - mg/dL  BUN 17  6-26 - mg/dL  CREATININE 1.19  0.60-1.30 - mg/dl  eGFR (NON-AFRICAN AMERICAN) 58 L >60 - calc  eGFR (AFRICAN AMERICAN) 70  >60 - calc  SODIUM 129 L 136-145 - mmol/L  POTASSIUM 4.4  3.5-5.5 - mmol/L  CHLORIDE 95 L 98-107 - mmol/L  C02 28  22-32 - mmol/L  ANION GAP 10.2  6.0-20.0 - mmol/L  CALCIUM 9.5  8.6-10.3 - mg/dL  BUN/CR ratio 14.3  12.0-20.0 - calc   Alleene Stoy,JAY 11/24/2012 04:02:47 PM > kidney funciton stable. Sodium i slow. WOuld change lisinopril HCTZ to lisinopril 10 mg daily. Disp 30, RF 11. Stegall,Amy 11/24/2012 04:08:48 PM > Pt notified. Rx sent in.        Lab: BNP  B-NATRIURETIC  PEPTIDE 19  0-100 - pg/mL   Syrianna Schillaci,JAY 11/24/2012 04:02:11 PM > no excess fluid. May be a viral infection. Stegall,Amy 11/24/2012 04:08:24 PM > Pt notified.     Signed, Mina Marble, MD, [facility] 09/03/2013 5:00 PM

## 2013-09-04 NOTE — Progress Notes (Signed)
Manual pressure hold completed, TR band replaced to right wrist, 12 cc air injected into band and ace bandage wrap applied  per Barbaraann Rondo from cath lab.

## 2013-09-04 NOTE — Progress Notes (Signed)
Patient with significant bleeding at the right radial site.  Hematoma extending in the forearm to the elbow.  Due to significant pain, the patient was given 1 mg Morphine.  About 15 minutes later, while manual pressure was being help, his HR dropped to 20 bpm.  He became hypotensive.  He received an amp of atropine and his vitals recovered.  Currently, his HR is 60, BP 110/72.  He feels much better.  He has pain with compression of the mid forearm.    Bleeding does not appear to be at the entry site.  THe hematoma developed at the mid forearm.  It is possible that a small branch of the radial artery was sheared during access.  There were no problems during the case so no imaging of the radial artery was performed.  He felt no warmth or tightness in the forearm when leaving the cath lab.  It seems that a perforation is unlikely given this scenario. Currently, the forearm has been stable for 15 minutes without manual compression.    Will watch overnight. Check labs in AM.  If labs stable, plan d/c tomorrow.  Plan for PCI LAD at a later time after forearm issue has stabilized.   Critical care time 40 minutes.

## 2013-09-04 NOTE — Interval H&P Note (Signed)
Cath Lab Visit (complete for each Cath Lab visit)  Clinical Evaluation Leading to the Procedure:   ACS: No.  Non-ACS:    Anginal Classification: CCS III  Anti-ischemic medical therapy: Minimal Therapy (1 class of medications)  Non-Invasive Test Results: Abnormal echo  Prior CABG: No previous CABG      History and Physical Interval Note:  09/04/2013 2:57 PM  [name]  has presented today for surgery, with the diagnosis of abnormal ekg  The various methods of treatment have been discussed with the patient and family. After consideration of risks, benefits and other options for treatment, the patient has consented to  Procedure(s): LEFT HEART CATHETERIZATION WITH CORONARY ANGIOGRAM (N/A) as a surgical intervention .  The patient's history has been reviewed, patient examined, no change in status, stable for surgery.  I have reviewed the patient's chart and labs.  Questions were answered to the patient's satisfaction.     VARANASI,JAYADEEP S.

## 2013-09-04 NOTE — CV Procedure (Addendum)
PROCEDURE:  Left heart catheterization with selective coronary angiography, FFR LAD.    INDICATIONS:  CAD, severe DOE, class III angina  The risks, benefits, and details of the procedure were explained to the patient.  The patient verbalized understanding and wanted to proceed.  Informed written consent was obtained.  PROCEDURE TECHNIQUE:  After Xylocaine anesthesia a 16F slender sheath was placed in the right radial artery with a single anterior needle wall stick.  IV heparin was given. Right coronary angiography was done using a Judkins R4 guide catheter.  Left coronary angiography was done using a Judkins L3.5 guide catheter.  Left ventriculography was done using a pigtail catheter.  A TR band was used for hemostasis.  There was a hematoma noted in the forearm. The second TR band was placed more proximally for better hemostasis.   CONTRAST:  Total of 85 cc.  COMPLICATIONS:  None.    HEMODYNAMICS:  Aortic pressure was 136/63; LV pressure was 133/4; LVEDP 9.  There was no gradient between the left ventricle and aorta.    ANGIOGRAPHIC DATA:   The left main coronary artery is mildly diseased.  The left anterior descending artery is heavily calcified proximally. There is calcium at the ostium. There is a 60-70% hazy stenosis proximally. The stents in the mid LAD are widely patent. There is an ostial lesion at the diagonal which was jailed by the stent. The remainder of the mid to distal LAD is widely patent.  The left circumflex artery is a large vessel with mild atherosclerosis. There are 2 medium-size obtuse marginal vessels which are patent.  The third obtuse marginal is medium size and widely patent.  The right coronary artery is a medium-sized, dominant vessel. There is mild disease proximally. There is diffuse calcification. The posterior descending artery is medium-sized and patent. He was difficult to selectively engage the RCA due to tortuosity in the right shoulder.  LEFT  VENTRICULOGRAM:  Left ventricular angiogram was not done.  LVEDP was 9 mmHg.  INTERVENTIONAL NARRATIVE: And EBU 3.0 guiding catheter was used to engage the left main. IV heparin was used for anticoagulation. An ACT was used to check that the heparin was therapeutic. The pressure wire was normal. He was then placed across the area disease in the LAD. Resting FFR was 0.88. After IV adenosine causing maximal hyperemia, FFR decreased to 0.75. The patient tolerated the procedure well.  IMPRESSIONS:  1. Normal left main coronary artery. 2. Significant calcific disease in the proximal left anterior descending artery.  FFR of the LAD after hyperemia was 0.75. 3. Mild disease in the left circumflex artery and its branches. 4. Mild disease in the right coronary artery. 5. LVEDP 9 mmHg.    RECOMMENDATION:  Plan for PCI of the LAD. We'll have to do this from the groin. The tortuosity of the shoulder does not allow for adequate torquing of the catheters. There appears to be disease at the ostium of the LAD. This will likely have to be treated as well. We'll plan for a 7 French catheter. Will IVUS the LAD prior to rotational atherectomy.  Plan for pacemaker as well due to resting bradycardia.  Continue the clopidogrel. He was also noted that patient had bradycardia during the case. He had a left bundle-branch block with a first-degree AV block as well. He has not had syncope. Will stop his amiodarone to see if his heart rate improves. Certainly, if he has syncope, he would potentially need a biventricular  pacemaker given his low EF.

## 2013-09-04 NOTE — Progress Notes (Signed)
Right radial site continues to swell. TR band removed again and manual pressure held per Barbaraann Rondo from cath lab. Dr. Irish Lack at bedside to evaluate pt.

## 2013-09-04 NOTE — Progress Notes (Signed)
Rodney from cath lab continuing to hold manual pressure to right radial site. Dr. Irish Lack notified of pt status and pt c/o pain. Order received for obs admission on tele (6500) and pain medication order. Bed requested and pain meds given per orders.

## 2013-09-04 NOTE — Consult Note (Signed)
Vascular Surgery Consultation  Reason for Consult: Hematoma right forearm following cardiac catheterization earlier today  HPI: [name] is a 80 y.o. male who presents for evaluation of hematoma right forearm. This patient had a cardiac catheterization via the right radial artery approach by Dr.Varanasi earlier today. Patient did have some bleeding from the right radial puncture site. This required forearm compression earlier this evening . The nursing staff was concerned that this had enlarged. Patient at one point complained of some tingling on the right hand fingertips. He denied pain in the hand. Compression dressing was applied by the nursing staff. The patient is scheduled to return next week for cardiac catheterization through femoral approach with atherectomy of calcified LAD lesion.   Past Medical History  Diagnosis Date  . Myocardial infarction 1980's- medical intervention  . Acute myocardial infarction 2010    s/p ptca  x2 DE stents  . PAF (paroxysmal atrial fibrillation)     controlled w/ amiodarone  . Sleep apnea     non-compliant cpap  . Heart murmur     mitrial regurg.  [name] Shortness of breath     w/ exertion  . GERD (gastroesophageal reflux disease)     occ. take prevacid  . Headache(784.0)     sinus  . Meningioma right -sided w/ right VI palsy    followed by dr Gaynell Face  . Diverticulitis of colon with bleeding     s/p sigmoid resection '88  . History of GI diverticular bleed april 2012    transfused blood and resolved without surgical intervention  . Impotence, organic     s/p penile prosthesis 1990's  . Coronary artery disease     cardiologist at [facility] last visit 3 mon ago- requested note,ekg,echo,stress test  . DJD (degenerative joint disease) hips and knees    s/p bilateral total replacements  . DDD (degenerative disc disease)     chronic back pain  . BPH (benign prostatic hypertrophy) with urinary obstruction     s/p turp yrs ago  . Blood  transfusion   . Impaired hearing bilateral     only left hearing aid  . Hematoma   . Bradycardia    Past Surgical History  Procedure Laterality Date  . Cholecystectomy    . Joint replacement  03-25-08--total right hip     s/ revision's x2 and sev. closed reduction for dislocation last one 06-20-09  . Joint replacement  2005    total left hip  . Joint replacement  2004    total right knee and revision  . Joint replacement  1997    total left knee  . Hernia repair      bilateral inguinal hernia repair  . Cataract extraction w/ intraocular lens  implant, bilateral    . Appendectomy    . Transurethral resection of prostate  yrs ago  . Penile prosthesis implant  1990's  . Rotator cuff repair  left  . Right ear surgery  yrs ago  . Cardiac catheterization  2007    noncritical cad (results w/ chart)  . Coronary angioplasty with stent placement  08-03-08    drug-eluting stent x2 distal and mid lad  . Transurethral resection of prostate  01/08/2011    Procedure: TRANSURETHRAL RESECTION OF THE PROSTATE (TURP);  Surgeon: Franchot Gallo;  Location: [facility];  Service: Urology;  Laterality: N/A;  GYRUS OWER   History   Social History  . Marital Status: Divorced    Spouse Name: N/A  Number of Children: N/A  . Years of Education: N/A   Social History Main Topics  . Smoking status: Former Smoker    Quit date: 01/04/1961  . Smokeless tobacco: Never Used  . Alcohol Use: No  . Drug Use: No  . Sexual Activity: Not on file   Other Topics Concern  . Not on file   Social History Narrative  . No narrative on file   Family History  Problem Relation Age of Onset  . Heart disease Mother    Allergies  Allergen Reactions  . Codeine Other (See Comments)    Insomnia/ hyper  . Warfarin Sodium Other (See Comments)    Hx gi bleed   Prior to Admission medications   Medication Sig Start Date End Date Taking? Authorizing Provider  amiodarone (PACERONE) 200 MG  tablet TAKE 1 TABLET BY MOUTH EVERY DAY 02/02/13   Jettie Booze, MD  clopidogrel (PLAVIX) 75 MG tablet TAKE 1 TABLET BY MOUTH EVERY DAY 02/02/13   Jettie Booze, MD  famotidine (PEPCID) 10 MG tablet Take 10 mg by mouth daily.    Historical Provider, MD  losartan (COZAAR) 50 MG tablet 1/2 tablet daily or as directed 07/10/13   Jettie Booze, MD  Multiple Vitamin (MULTIVITAMIN) tablet Take 1 tablet by mouth daily.      Historical Provider, MD  sodium chloride (OCEAN) 0.65 % nasal spray Place 1 spray into the nose as needed for congestion.    Historical Provider, MD     Positive ROS:   All other systems have been reviewed and were otherwise negative with the exception of those mentioned in the HPI and as above.  Physical Exam: Filed Vitals:   09/04/13 2012  BP: 121/64  Pulse: 51  Temp: 97.9 F (36.6 C)  Resp: 18    General: Alert, no acute distress HEENT: Normal for age Cardiovascular: Regular rate and rhythm. Carotid pulses 2+, no bruits audible Respiratory: Clear to auscultation. No cyanosis, no use of accessory musculature GI: No organomegaly, abdomen is soft and non-tender Skin: No lesions in the area of chief complaint Neurologic: Sensation intact distally Psychiatric: Patient is competent for consent with normal mood and affect Musculoskeletal: No obvious deformities Extremities: Right forearm examined. Compression dressing removed. Diffuse ecchymosis in mid forearm but no tight hematoma noted. Excellent movement of the digits with good sensation and motion in all nerve function intact. 3+ radial and 2+ ulnar pulse palpable and excellent triphasic flow by Doppler. Fingers are pink and well perfused.       Assessment/Plan:  Diffuse hematoma right forearm following cardiac catheterization via right radial approach earlier. No evidence of any continued expansion of this and actually the hematoma has diffused and forearm is relatively soft. Nothing to evacuate  or drain. Good perfusion to right hand distally with no nerve dysfunction.  Would elevate the hand and arm tonight on 2 pillows and I reapplied the compression dressing. Patient should be able to be discharged as planned in a.m. if no further issues tonight. We'll see again at your request   Tinnie Gens, MD 09/04/2013 9:46 PM

## 2013-09-05 ENCOUNTER — Encounter (HOSPITAL_COMMUNITY): Payer: Self-pay | Admitting: Physician Assistant

## 2013-09-05 DIAGNOSIS — I4891 Unspecified atrial fibrillation: Secondary | ICD-10-CM

## 2013-09-05 DIAGNOSIS — I10 Essential (primary) hypertension: Secondary | ICD-10-CM | POA: Diagnosis not present

## 2013-09-05 DIAGNOSIS — I498 Other specified cardiac arrhythmias: Secondary | ICD-10-CM | POA: Diagnosis not present

## 2013-09-05 DIAGNOSIS — I209 Angina pectoris, unspecified: Secondary | ICD-10-CM

## 2013-09-05 DIAGNOSIS — I2510 Atherosclerotic heart disease of native coronary artery without angina pectoris: Secondary | ICD-10-CM | POA: Diagnosis not present

## 2013-09-05 LAB — BASIC METABOLIC PANEL
Anion gap: 13 (ref 5–15)
BUN: 18 mg/dL (ref 6–23)
CO2: 22 mEq/L (ref 19–32)
Calcium: 8.4 mg/dL (ref 8.4–10.5)
Chloride: 101 mEq/L (ref 96–112)
Creatinine, Ser: 1.11 mg/dL (ref 0.50–1.35)
GFR calc Af Amer: 68 mL/min — ABNORMAL LOW (ref >90)
GFR, EST NON AFRICAN AMERICAN: 59 mL/min — AB (ref >90)
Glucose, Bld: 105 mg/dL — ABNORMAL HIGH (ref 70–99)
POTASSIUM: 4.4 meq/L (ref 3.7–5.3)
Sodium: 136 mEq/L — ABNORMAL LOW (ref 137–147)

## 2013-09-05 LAB — CBC
HCT: 37 % — ABNORMAL LOW (ref 39.0–52.0)
Hemoglobin: 12.6 g/dL — ABNORMAL LOW (ref 13.0–17.0)
MCH: 31 pg (ref 26.0–34.0)
MCHC: 34.1 g/dL (ref 30.0–36.0)
MCV: 91.1 fL (ref 78.0–100.0)
PLATELETS: 158 10*3/uL (ref 150–400)
RBC: 4.06 MIL/uL — ABNORMAL LOW (ref 4.22–5.81)
RDW: 14 % (ref 11.5–15.5)
WBC: 6.4 10*3/uL (ref 4.0–10.5)

## 2013-09-05 MED ORDER — ALUM & MAG HYDROXIDE-SIMETH 200-200-20 MG/5ML PO SUSP
30.0000 mL | Freq: Once | ORAL | Status: AC
  Administered 2013-09-05: 02:00:00 30 mL via ORAL
  Filled 2013-09-05: qty 30

## 2013-09-05 NOTE — Progress Notes (Signed)
  Progress Note   Subjective:  Denies CP or dyspnea.  R arm feels ok.  No significant pain.   Objective:  Filed Vitals:   09/05/13 0000 09/05/13 0300 09/05/13 0400 09/05/13 0733  BP: 101/37 126/47  106/52  Pulse: 47 43 41 45  Temp:  97.3 F (36.3 C)  97.4 F (36.3 C)  TempSrc:  Oral  Other (Comment)  Resp:  18  18  Height:      Weight:  197 lb 12 oz (89.7 kg)    SpO2: 96% 95% 94% 93%    Intake/Output from previous day:  Intake/Output Summary (Last 24 hours) at 09/05/13 0750 Last data filed at 09/05/13 0000  Gross per 24 hour  Intake 527.17 ml  Output      0 ml  Net 527.17 ml    PHYSICAL EXAM: No acute distress Neck: no JVD Cardiac:  normal S1, S2; RRR; no murmur Lungs:  clear to auscultation bilaterally, no wheezing, rhonchi or rales Abd: soft, nontender, no hepatomegaly Ext: no edemaR forearm with large amount of ecchymosis, no palpable hematoma, no significant tenderness, R radial pulse 2+, hand warm and dry Skin: warm and dry Neuro:  CNs 2-12 intact, no focal abnormalities noted   Lab Results:  Basic Metabolic Panel:  Recent Labs  09/03/13 1641 09/05/13 0306  NA 131* 136*  K 4.3 4.4  CL 99 101  CO2 25 22  GLUCOSE 102* 105*  BUN 20 18  CREATININE 1.3 1.11  CALCIUM 8.9 8.4    CBC:  Recent Labs  09/03/13 1641 09/05/13 0306  WBC 7.2 6.4  NEUTROABS 4.1  --   HGB 14.2 12.6*  HCT 42.0 37.0*  MCV 94.5 91.1  PLT 175.0 158    Cardiac Enzymes: No results found for this basename: CKTOTAL, CKMB, CKMBINDEX, TROPONINI,  in the last 72 hours   Assessment:   Active Problems:   Coronary atherosclerosis of native coronary artery   R wrist hematoma following coronary angiography   Bradycardia   Atrial fibrillation     Plan:  Cardiac cath demonstrated high grade LAD disease.  Plan is to proceed with PCI.  Patient developed significant R arm hematoma after radial case.  He was seen by vascular surgery yesterday and overall felt to be stable.   Patient denies significant pain today and arm looks ok.  No significant hematoma palpated.  Hgb 14.2 >>> 12.6.  Should be ok for d/c today.  Dr. Kate Sable to see as well.  Amiodarone d/c'd 2/2 bradycardia.  Notes indicate patient may need PPM.  He is not on coumadin due to hx of GI bleed.  Will plan on early follow up next week to check R arm, repeat CBC and decide on timing of PCI +/- pacemaker.    Nathan Dopp, PA-C   09/05/2013 7:50 AM  Pager # [phone]   ATTENDING ADDENDUM: Pt denies significant right forearm/hand pain. Said swelling has gone down considerably. Denies chest pain. Says HR never gets above 49 bpm. Went up to bathroom twice and denies dizziness. Has home care. Plans being made for elective PCI of LAD in approximately 3-4 weeks as per patient. Had been on amiodarone which has since been discontinued. He may need pacemaker at some point but will first wait for amiodarone to wash out. Will d/c to home today.

## 2013-09-05 NOTE — Progress Notes (Signed)
Received pt from the cath lab with S/P cath R radial site Level"1" with ace bandage wrap around middle of the forearm. It was level"2" in the cath lab but subside after applying an hour of manual pressure by Cath lab RN. At around 20:30 pt's R arm noted more swollen & hard to touch, very tender according to the pt. No pain in the hand but some  tingling sensation in the R hand fingertips.Marked the area about 2" below the antecubital area. Compression dressing applied. Morphine IV given as PRN.  Dr.Whitlock informed & came to reassessed the R forearm. Also Dr. Kellie Simmering reassessed pt. Compression dressing continued as ordered & loosened after an hour. Presently the site is stable, no bleeding, swelling subsides, & soft . Will continue to monitor the site.

## 2013-09-05 NOTE — Discharge Summary (Signed)
See my note

## 2013-09-05 NOTE — Discharge Instructions (Signed)
Stop amiodarone.  Will plan for 48 hour Holter monitor next week as well.     Radial Site Care Refer to this sheet in the next few weeks. These instructions provide you with information on caring for yourself after your procedure. Your caregiver may also give you more specific instructions. Your treatment has been planned according to current medical practices, but problems sometimes occur. Call your caregiver if you have any problems or questions after your procedure. HOME CARE INSTRUCTIONS  You may shower the day after the procedure.Remove the bandage (dressing) and gently wash the site with plain soap and water.Gently pat the site dry.  Do not apply powder or lotion to the site.  Do not submerge the affected site in water for 3 to 5 days.  Inspect the site at least twice daily.  Do not flex or bend the affected arm for 24 hours.  No lifting over 5 pounds (2.3 kg) for 5 days after your procedure.  Do not drive home if you are discharged the same day of the procedure. Have someone else drive you.  You may drive 24 hours after the procedure unless otherwise instructed by your caregiver.  Do not operate machinery or power tools for 24 hours.  A responsible adult should be with you for the first 24 hours after you arrive home. What to expect:  Any bruising will usually fade within 1 to 2 weeks.  Blood that collects in the tissue (hematoma) may be painful to the touch. It should usually decrease in size and tenderness within 1 to 2 weeks. SEEK IMMEDIATE MEDICAL CARE IF:  You have unusual pain at the radial site.  You have redness, warmth, swelling, or pain at the radial site.  You have drainage (other than a small amount of blood on the dressing).  You have chills.  You have a fever or persistent symptoms for more than 72 hours.  You have a fever and your symptoms suddenly get worse.  Your arm becomes pale, cool, tingly, or numb.  You have heavy bleeding from the site.  Hold pressure on the site. Document Released: 03/17/2010 Document Revised: 05/07/2011 Document Reviewed: 03/17/2010 [facility] Patient Information 2015 [place], [place]. This information is not intended to replace advice given to you by your health care provider. Make sure you discuss any questions you have with your health care provider.

## 2013-09-05 NOTE — Discharge Summary (Signed)
Discharge Summary   Patient ID: [name], MRN: [id number], DOB/AGE: 06-24-1926 80 y.o.  Admit date: 09/04/2013 Discharge date: 09/05/2013   Primary Care Physician:  Earle Gell K   Primary Cardiologist:  Dr. Casandra Doffing    Reason for Admission:  CCS Class 3 Angina   Primary Discharge Diagnoses:  Principal Problem:   Angina, class III Active Problems:   Coronary atherosclerosis of native coronary artery   R wrist hematoma following coronary angiography   Bradycardia   Atrial fibrillation     Wt Readings from Last 3 Encounters:  09/05/13 197 lb 12 oz (89.7 kg)  09/05/13 197 lb 12 oz (89.7 kg)  09/03/13 185 lb (83.915 kg)    Secondary Discharge Diagnoses:   Past Medical History  Diagnosis Date  . Myocardial infarction 1980's- medical intervention  . CAD (coronary artery disease)     a. s/p MI and prior PCI of LAD;  b. LHC (08/2013):  prox LAD 60-70%, mid LAD stents ok, ostial lesion at Dx jailed by stent, mild CFX and RCA disesase >>> PCI of LAD planned   . PAF (paroxysmal atrial fibrillation)     controlled w/ amiodarone; not on coumadin due to hx of GI bleed  . Sleep apnea     non-compliant cpap  . Mitral regurgitation   . GERD (gastroesophageal reflux disease)     occ. take prevacid  . Meningioma right -sided w/ right VI palsy    followed by dr Gaynell Face  . Diverticulitis of colon with bleeding     s/p sigmoid resection '88  . History of GI diverticular bleed april 2012    transfused blood and resolved without surgical intervention  . Impotence, organic     s/p penile prosthesis 1990's  . DJD (degenerative joint disease) hips and knees    s/p bilateral total replacements  . DDD (degenerative disc disease)     chronic back pain  . BPH (benign prostatic hypertrophy) with urinary obstruction     s/p turp yrs ago  . Blood transfusion   . Impaired hearing bilateral     only left hearing aid  . Bradycardia     Amio d/c'd 08/2013  . Cardiomyopathy      a. Echo (08/2013):  EF 30-35%, AS hypokinesis, Gr 1 diast dysfn, mild MR, mild LAE      Allergies:    Allergies  Allergen Reactions  . Codeine Other (See Comments)    Insomnia/ hyper  . Warfarin Sodium Other (See Comments)    Hx gi bleed      Procedures Performed This Admission:    1.  Cardiac Catheterization (09/04/13):  ANGIOGRAPHIC DATA: The left main coronary artery is mildly diseased.  The left anterior descending artery is heavily calcified proximally. There is calcium at the ostium. There is a 60-70% hazy stenosis proximally. The stents in the mid LAD are widely patent. There is an ostial lesion at the diagonal which was jailed by the stent. The remainder of the mid to distal LAD is widely patent.  The left circumflex artery is a large vessel with mild atherosclerosis. There are 2 medium-size obtuse marginal vessels which are patent. The third obtuse marginal is medium size and widely patent.  The right coronary artery is a medium-sized, dominant vessel. There is mild disease proximally. There is diffuse calcification. The posterior descending artery is medium-sized and patent. He was difficult to selectively engage the RCA due to tortuosity in the right shoulder.   LEFT VENTRICULOGRAM: Left  ventricular angiogram was not done. LVEDP was 9 mmHg.   INTERVENTIONAL NARRATIVE: And EBU 3.0 guiding catheter was used to engage the left main. IV heparin was used for anticoagulation. An ACT was used to check that the heparin was therapeutic. The pressure wire was normal. He was then placed across the area disease in the LAD. Resting FFR was 0.88. After IV adenosine causing maximal hyperemia, FFR decreased to 0.75. The patient tolerated the procedure well.   IMPRESSIONS:  1. Normal left main coronary artery. 2. Significant calcific disease in the proximal left anterior descending artery. FFR of the LAD after hyperemia was 0.75. 3. Mild disease in the left circumflex artery and its  branches. 4. Mild disease in the right coronary artery. 5. LVEDP 9 mmHg.  6.  RECOMMENDATION: Plan for PCI of the LAD. We'll have to do this from the groin. The tortuosity of the shoulder does not allow for adequate torquing of the catheters. There appears to be disease at the ostium of the LAD. This will likely have to be treated as well. We'll plan for a 7 French catheter. Will IVUS the LAD prior to rotational atherectomy. Plan for pacemaker as well due to resting bradycardia.  Continue the clopidogrel. He was also noted that patient had bradycardia during the case. He had a left bundle-branch block with a first-degree AV block as well. He has not had syncope. Will stop his amiodarone to see if his heart rate improves. Certainly, if he has syncope, he would potentially need a biventricular pacemaker given his low EF.   Hospital Course:  [name] is a 80 y.o. male with a hx of CAD s/p prior PCI of LAD in 2010, paroxysmal AFib maintaining NSR on Amiodarone, prior GI bleed (no longer on coumadin), GERD, DJD. He was recently seen by Dr. Casandra Doffing for worsening dyspnea and episodes of chest pain.   Follow up echo demonstrated worsening LVF with EF 30-35%.  He was therefore set up for cardiac catheterization.  This was done yesterday and demonstrated significant calcific proximal LAD disease.  It was felt the patient would need PCI of his LAD.  He did have difficulty from the R radial access due to tortuosity of his shoulder.  Therefore, the plan is to proceed via the groin.  The patient did develop a R forearm hematoma post cath with significant pain.  He was seen by Dr. Kellie Simmering with Vascular Surgery.  After continued compression, his R forearm looked stable with good perfusion and no evidence of nerve dysfunction.  During his cath, he was noted to have significant bradycardia.  It was felt that he made need PPM implantation.  His Amiodarone was d/c'd given his bradycardia.  He was seen this AM by  Dr. Kate Sable and felt to be stable for d/c to home.  He had no chest pain and his R forearm is without significant pain.  Plan will be to follow up in the office next week to decide on timing of PCI of LAD.     Discharge Vitals:   Blood pressure 106/52, pulse 45, temperature 97.4 F (36.3 C), temperature source Other (Comment), resp. rate 18, height 5\' 7"  (1.702 m), weight 197 lb 12 oz (89.7 kg), SpO2 93.00%.   Labs:   Recent Labs  09/03/13 1641 09/05/13 0306  WBC 7.2 6.4  HGB 14.2 12.6*  HCT 42.0 37.0*  MCV 94.5 91.1  PLT 175.0 158     Recent Labs  09/03/13 1641 09/05/13 0306  NA 131* 136*  K 4.3 4.4  CL 99 101  CO2 25 22  BUN 20 18  CREATININE 1.3 1.11  CALCIUM 8.9 8.4       Recent Labs  09/04/13 1048  INR 1.10     Diagnostic Procedures and Studies:  No results found.   2D Echocardiogram 7.6.2015 Study Conclusions  - Left ventricle: Systolic function was moderately to severely reduced. The estimated ejection fraction was in the range of 30% to 35%. There is hypokinesis of the anteroseptal myocardium. Doppler parameters are consistent with abnormal left ventricular relaxation (grade 1 diastolic dysfunction). Doppler parameters are consistent with high ventricular filling pressure. - Mitral valve: There was mild regurgitation directed eccentrically. - Left atrium: The atrium was mildly dilated.    Disposition:   Pt is being discharged home today in good condition.  Follow-up Plans & Appointments      Follow-up Information   Follow up with Jettie Booze., MD In 1 week. (You will see Dr. Irish Lack or the PA.  Office will call with appointment.)    Specialty:  Interventional Cardiology   Contact information:   [address] [place] [address] [phone]       Discharge Medications    Medication List    STOP taking these medications       amiodarone 200 MG tablet  Commonly known as:  PACERONE        TAKE these medications       clopidogrel 75 MG tablet  Commonly known as:  PLAVIX  TAKE 1 TABLET BY MOUTH EVERY DAY     famotidine 10 MG tablet  Commonly known as:  PEPCID  Take 10 mg by mouth daily.     losartan 50 MG tablet  Commonly known as:  COZAAR  1/2 tablet daily or as directed     multivitamin tablet  Take 1 tablet by mouth daily.     sodium chloride 0.65 % nasal spray  Commonly known as:  OCEAN  Place 1 spray into the nose as needed for congestion.         Outstanding Labs/Studies  1. CBC at follow up appointment. 2. Consider arranging 48 Hr Holter at follow up appointment.   Duration of Discharge Encounter: Greater than 30 minutes including physician and PA time.  Signed, Richardson Dopp, PA-C   09/05/2013 9:26 AM

## 2013-09-07 ENCOUNTER — Telehealth: Payer: Self-pay | Admitting: Cardiology

## 2013-09-07 NOTE — Telephone Encounter (Signed)
Per Dr. Irish Lack, call pt and see how he is feeling. Pt needs to be set up for PCI.

## 2013-09-07 NOTE — Telephone Encounter (Signed)
PCI moved up to tomorrow at 12 noon. Pt aware and instructions given.

## 2013-09-07 NOTE — Telephone Encounter (Signed)
Cath r/s for Friday 09/11/13 at 9 am. I will check and see if pt is agreeable.

## 2013-09-07 NOTE — Telephone Encounter (Signed)
Spoke with pt and he is till not feeling well. He has some CP. He was unaware that he is scheduled for PCI tomorrow at 9am. Pt states he wants to wait a week or two but if Dr. Irish Lack thinks he should have done this week he would consider it.

## 2013-09-08 ENCOUNTER — Encounter (HOSPITAL_COMMUNITY)
Admission: RE | Disposition: A | Discharge status: Home / Self Care | Payer: Self-pay | Source: Ambulatory Visit | Attending: Interventional Cardiology

## 2013-09-08 ENCOUNTER — Other Ambulatory Visit: Payer: Self-pay | Admitting: Interventional Cardiology

## 2013-09-08 ENCOUNTER — Inpatient Hospital Stay (HOSPITAL_COMMUNITY)
Admission: RE | Admit: 2013-09-08 | Discharge: 2013-09-10 | DRG: 246 | Disposition: A | Discharge status: Home / Self Care | Payer: Medicare Other | Source: Ambulatory Visit | Attending: Interventional Cardiology | Admitting: Interventional Cardiology

## 2013-09-08 DIAGNOSIS — H9190 Unspecified hearing loss, unspecified ear: Secondary | ICD-10-CM | POA: Diagnosis present

## 2013-09-08 DIAGNOSIS — Z961 Presence of intraocular lens: Secondary | ICD-10-CM | POA: Diagnosis not present

## 2013-09-08 DIAGNOSIS — I2510 Atherosclerotic heart disease of native coronary artery without angina pectoris: Secondary | ICD-10-CM | POA: Diagnosis not present

## 2013-09-08 DIAGNOSIS — Z9849 Cataract extraction status, unspecified eye: Secondary | ICD-10-CM | POA: Diagnosis not present

## 2013-09-08 DIAGNOSIS — Z9119 Patient's noncompliance with other medical treatment and regimen: Secondary | ICD-10-CM | POA: Diagnosis not present

## 2013-09-08 DIAGNOSIS — Z7982 Long term (current) use of aspirin: Secondary | ICD-10-CM

## 2013-09-08 DIAGNOSIS — I447 Left bundle-branch block, unspecified: Secondary | ICD-10-CM | POA: Diagnosis present

## 2013-09-08 DIAGNOSIS — K219 Gastro-esophageal reflux disease without esophagitis: Secondary | ICD-10-CM | POA: Diagnosis present

## 2013-09-08 DIAGNOSIS — I252 Old myocardial infarction: Secondary | ICD-10-CM

## 2013-09-08 DIAGNOSIS — G4730 Sleep apnea, unspecified: Secondary | ICD-10-CM | POA: Diagnosis present

## 2013-09-08 DIAGNOSIS — Z79899 Other long term (current) drug therapy: Secondary | ICD-10-CM

## 2013-09-08 DIAGNOSIS — Z7902 Long term (current) use of antithrombotics/antiplatelets: Secondary | ICD-10-CM

## 2013-09-08 DIAGNOSIS — Z888 Allergy status to other drugs, medicaments and biological substances status: Secondary | ICD-10-CM | POA: Diagnosis not present

## 2013-09-08 DIAGNOSIS — I2589 Other forms of chronic ischemic heart disease: Secondary | ICD-10-CM | POA: Diagnosis present

## 2013-09-08 DIAGNOSIS — I10 Essential (primary) hypertension: Secondary | ICD-10-CM | POA: Diagnosis not present

## 2013-09-08 DIAGNOSIS — R001 Bradycardia, unspecified: Secondary | ICD-10-CM | POA: Diagnosis present

## 2013-09-08 DIAGNOSIS — I469 Cardiac arrest, cause unspecified: Secondary | ICD-10-CM | POA: Diagnosis not present

## 2013-09-08 DIAGNOSIS — Y9210 Unspecified residential institution as the place of occurrence of the external cause: Secondary | ICD-10-CM | POA: Diagnosis not present

## 2013-09-08 DIAGNOSIS — Z91199 Patient's noncompliance with other medical treatment and regimen due to unspecified reason: Secondary | ICD-10-CM

## 2013-09-08 DIAGNOSIS — I5022 Chronic systolic (congestive) heart failure: Secondary | ICD-10-CM | POA: Diagnosis present

## 2013-09-08 DIAGNOSIS — Z96649 Presence of unspecified artificial hip joint: Secondary | ICD-10-CM

## 2013-09-08 DIAGNOSIS — I4891 Unspecified atrial fibrillation: Secondary | ICD-10-CM | POA: Diagnosis present

## 2013-09-08 DIAGNOSIS — I25119 Atherosclerotic heart disease of native coronary artery with unspecified angina pectoris: Secondary | ICD-10-CM

## 2013-09-08 DIAGNOSIS — Z9089 Acquired absence of other organs: Secondary | ICD-10-CM | POA: Diagnosis not present

## 2013-09-08 DIAGNOSIS — I498 Other specified cardiac arrhythmias: Secondary | ICD-10-CM | POA: Diagnosis present

## 2013-09-08 DIAGNOSIS — Y840 Cardiac catheterization as the cause of abnormal reaction of the patient, or of later complication, without mention of misadventure at the time of the procedure: Secondary | ICD-10-CM | POA: Diagnosis not present

## 2013-09-08 DIAGNOSIS — Z9861 Coronary angioplasty status: Secondary | ICD-10-CM

## 2013-09-08 DIAGNOSIS — R0602 Shortness of breath: Secondary | ICD-10-CM | POA: Diagnosis not present

## 2013-09-08 DIAGNOSIS — Z96659 Presence of unspecified artificial knee joint: Secondary | ICD-10-CM | POA: Diagnosis not present

## 2013-09-08 DIAGNOSIS — I209 Angina pectoris, unspecified: Secondary | ICD-10-CM | POA: Diagnosis present

## 2013-09-08 DIAGNOSIS — IMO0002 Reserved for concepts with insufficient information to code with codable children: Secondary | ICD-10-CM | POA: Diagnosis not present

## 2013-09-08 DIAGNOSIS — I428 Other cardiomyopathies: Secondary | ICD-10-CM | POA: Diagnosis not present

## 2013-09-08 DIAGNOSIS — Z87891 Personal history of nicotine dependence: Secondary | ICD-10-CM | POA: Diagnosis not present

## 2013-09-08 DIAGNOSIS — I480 Paroxysmal atrial fibrillation: Secondary | ICD-10-CM

## 2013-09-08 HISTORY — PX: PERCUTANEOUS CORONARY ROTOBLATOR INTERVENTION (PCI-R): SHX5484

## 2013-09-08 LAB — POCT ACTIVATED CLOTTING TIME
ACTIVATED CLOTTING TIME: 231 s
ACTIVATED CLOTTING TIME: 259 s
ACTIVATED CLOTTING TIME: 214 s
ACTIVATED CLOTTING TIME: 326 s
ACTIVATED CLOTTING TIME: 180 s
Activated Clotting Time: 214 seconds
Activated Clotting Time: 236 seconds
Activated Clotting Time: 253 seconds
Activated Clotting Time: 281 seconds
Activated Clotting Time: 270 seconds
Activated Clotting Time: 275 seconds

## 2013-09-08 LAB — TYPE AND SCREEN
ABO/RH(D): O POS
Antibody Screen: NEGATIVE

## 2013-09-08 LAB — MRSA PCR SCREENING: MRSA BY PCR: NEGATIVE

## 2013-09-08 SURGERY — PERCUTANEOUS CORONARY ROTOBLATOR INTERVENTION (PCI-R)
Anesthesia: LOCAL

## 2013-09-08 MED ORDER — HEPARIN SODIUM (PORCINE) 1000 UNIT/ML IJ SOLN
INTRAMUSCULAR | Status: AC
  Filled 2013-09-08: qty 1

## 2013-09-08 MED ORDER — ASPIRIN 81 MG PO CHEW
324.0000 mg | CHEWABLE_TABLET | ORAL | Status: AC
  Administered 2013-09-08: 324 mg via ORAL

## 2013-09-08 MED ORDER — ONDANSETRON HCL 4 MG/2ML IJ SOLN
4.0000 mg | Freq: Four times a day (QID) | INTRAMUSCULAR | Status: DC | PRN
  Administered 2013-09-08 – 2013-09-09 (×2): 4 mg via INTRAVENOUS
  Filled 2013-09-08 (×3): qty 2

## 2013-09-08 MED ORDER — VERAPAMIL HCL 2.5 MG/ML IV SOLN
INTRAVENOUS | Status: AC
  Filled 2013-09-08: qty 4

## 2013-09-08 MED ORDER — MIDAZOLAM HCL 2 MG/2ML IJ SOLN
INTRAMUSCULAR | Status: AC
  Filled 2013-09-08: qty 2

## 2013-09-08 MED ORDER — HEPARIN SODIUM (PORCINE) 1000 UNIT/ML IJ SOLN
INTRAMUSCULAR | Status: AC
Start: 2013-09-08 — End: 2013-09-08
  Filled 2013-09-08: qty 1

## 2013-09-08 MED ORDER — CLOPIDOGREL BISULFATE 300 MG PO TABS
300.0000 mg | ORAL_TABLET | Freq: Once | ORAL | Status: DC
  Filled 2013-09-08: qty 1

## 2013-09-08 MED ORDER — LIDOCAINE-EPINEPHRINE 1 %-1:100000 IJ SOLN
INTRAMUSCULAR | Status: AC
  Filled 2013-09-08: qty 1

## 2013-09-08 MED ORDER — DIAZEPAM 5 MG PO TABS
2.5000 mg | ORAL_TABLET | Freq: Once | ORAL | Status: AC
  Administered 2013-09-08: 2.5 mg via ORAL

## 2013-09-08 MED ORDER — SODIUM CHLORIDE 0.9 % IJ SOLN
3.0000 mL | INTRAMUSCULAR | Status: DC | PRN
Start: 2013-09-08 — End: 2013-09-08

## 2013-09-08 MED ORDER — SODIUM CHLORIDE 0.9 % IV SOLN
INTRAVENOUS | Status: AC
  Administered 2013-09-08: 19:00:00 via INTRAVENOUS

## 2013-09-08 MED ORDER — CLOPIDOGREL BISULFATE 75 MG PO TABS
75.0000 mg | ORAL_TABLET | Freq: Every day | ORAL | Status: DC
  Filled 2013-09-08: qty 1

## 2013-09-08 MED ORDER — HEPARIN (PORCINE) IN NACL 2-0.9 UNIT/ML-% IJ SOLN
INTRAMUSCULAR | Status: AC
  Filled 2013-09-08: qty 1500

## 2013-09-08 MED ORDER — SODIUM CHLORIDE 0.9 % IV SOLN
INTRAVENOUS | Status: DC
  Administered 2013-09-08: 11:00:00 via INTRAVENOUS

## 2013-09-08 MED ORDER — FAMOTIDINE 10 MG PO TABS
10.0000 mg | ORAL_TABLET | Freq: Every day | ORAL | Status: DC
  Administered 2013-09-08 – 2013-09-10 (×3): 10 mg via ORAL
  Filled 2013-09-08 (×3): qty 1

## 2013-09-08 MED ORDER — CLOPIDOGREL BISULFATE 75 MG PO TABS: 75.0000 mg | ORAL_TABLET | Freq: Every day | ORAL | Status: DC

## 2013-09-08 MED ORDER — NITROGLYCERIN 1 MG/10 ML FOR IR/CATH LAB
INTRA_ARTERIAL | Status: AC
  Filled 2013-09-08: qty 40

## 2013-09-08 MED ORDER — ASPIRIN 81 MG PO CHEW
81.0000 mg | CHEWABLE_TABLET | Freq: Every day | ORAL | Status: DC
  Administered 2013-09-09 – 2013-09-10 (×2): 81 mg via ORAL
  Filled 2013-09-08 (×2): qty 1

## 2013-09-08 MED ORDER — DIAZEPAM 5 MG PO TABS: 5.0000 mg | ORAL_TABLET | Freq: Every evening | ORAL | Status: DC | PRN

## 2013-09-08 MED ORDER — FENTANYL CITRATE 0.05 MG/ML IJ SOLN
INTRAMUSCULAR | Status: AC
  Filled 2013-09-08: qty 2

## 2013-09-08 MED ORDER — SALINE SPRAY 0.65 % NA SOLN
1.0000 | NASAL | Status: DC | PRN
  Filled 2013-09-08: qty 44

## 2013-09-08 MED ORDER — LIDOCAINE HCL (PF) 1 % IJ SOLN
INTRAMUSCULAR | Status: AC
  Filled 2013-09-08: qty 30

## 2013-09-08 MED ORDER — ASPIRIN 81 MG PO CHEW
CHEWABLE_TABLET | ORAL | Status: AC
  Administered 2013-09-08: 324 mg via ORAL
  Filled 2013-09-08: qty 1

## 2013-09-08 MED ORDER — SODIUM CHLORIDE 0.9 % IV SOLN: 250.0000 mL | INTRAVENOUS | Status: DC | PRN

## 2013-09-08 MED ORDER — "THROMBI-PAD 3""X3"" EX PADS"
1.0000 | MEDICATED_PAD | Freq: Once | CUTANEOUS | Status: AC
  Administered 2013-09-09: 1 via TOPICAL
  Filled 2013-09-08: qty 1

## 2013-09-08 MED ORDER — SODIUM CHLORIDE 0.9 % IV BOLUS (SEPSIS)
1000.0000 mL | Freq: Once | INTRAVENOUS | Status: AC
  Administered 2013-09-08: 500 mL via INTRAVENOUS

## 2013-09-08 MED ORDER — ACETAMINOPHEN 325 MG PO TABS
650.0000 mg | ORAL_TABLET | ORAL | Status: DC | PRN
  Administered 2013-09-10: 650 mg via ORAL
  Filled 2013-09-08: qty 2

## 2013-09-08 MED ORDER — ASPIRIN 81 MG PO CHEW
CHEWABLE_TABLET | ORAL | Status: AC
  Filled 2013-09-08: qty 3

## 2013-09-08 MED ORDER — DIAZEPAM 5 MG PO TABS
ORAL_TABLET | ORAL | Status: AC
  Administered 2013-09-08: 2.5 mg via ORAL
  Filled 2013-09-08: qty 1

## 2013-09-08 MED ORDER — SODIUM CHLORIDE 0.9 % IJ SOLN: 3.0000 mL | Freq: Two times a day (BID) | INTRAMUSCULAR | Status: DC

## 2013-09-08 MED ORDER — LOSARTAN POTASSIUM 25 MG PO TABS
25.0000 mg | ORAL_TABLET | Freq: Every day | ORAL | Status: DC
  Administered 2013-09-09: 25 mg via ORAL
  Filled 2013-09-08 (×2): qty 1

## 2013-09-08 NOTE — Code Documentation (Signed)
CODE BLUE NOTE  Patient Name: CHIBUEZE BEASLEY   MRN: [id number]   Date of Birth/ Sex: 07-29-1926 , male      Admission Date: 09/08/2013  Attending Provider: Jettie Booze, MD  Primary Diagnosis: <principal problem not specified>    Indication: Pt was in his usual state of health until this PM, when he was noted to be bradycardic into 20's, he felt like he was going to pass out and became unresponsive. Code blue was subsequently called. Patient received approximately 30sec of CPR, and was given a dose of Atropine (0.5mg ) prior to code team arrival. At the time of arrival on scene, patient had become responsive once again.    Technical Description:  - CPR performance duration:  30  seconds  - Was defibrillation or cardioversion used? No   - Was external pacer placed? No  - Was patient intubated pre/post CPR? No    Medications Administered: Y = Yes; Blank = No Amiodarone    Atropine  Y  Calcium    Epinephrine    Lidocaine    Magnesium    Norepinephrine    Phenylephrine    Sodium bicarbonate    Vasopressin      Post CPR evaluation:  - Final Status - Was patient successfully resuscitated ? Yes - What is current rhythm? Sinus Tachycardia - What is current hemodynamic status? Stable   Miscellaneous Information:  - Labs sent, including: IStat Chem 8 and Troponin, Magnesium  - Primary team notified?  Yes  - Family Notified? No  - Additional notes/ transfer status:  Cardiology called acutely to bedside. Patient Stable, see further progress notes and paper documentation.    Elberta Leatherwood, MD  09/08/2013, 11:18 PM

## 2013-09-08 NOTE — H&P (View-Only) (Signed)
Patient with significant bleeding at the right radial site.  Hematoma extending in the forearm to the elbow.  Due to significant pain, the patient was given 1 mg Morphine.  About 15 minutes later, while manual pressure was being help, his HR dropped to 20 bpm.  He became hypotensive.  He received an amp of atropine and his vitals recovered.  Currently, his HR is 60, BP 110/72.  He feels much better.  He has pain with compression of the mid forearm.    Bleeding does not appear to be at the entry site.  THe hematoma developed at the mid forearm.  It is possible that a small branch of the radial artery was sheared during access.  There were no problems during the case so no imaging of the radial artery was performed.  He felt no warmth or tightness in the forearm when leaving the cath lab.  It seems that a perforation is unlikely given this scenario. Currently, the forearm has been stable for 15 minutes without manual compression.    Will watch overnight. Check labs in AM.  If labs stable, plan d/c tomorrow.  Plan for PCI LAD at a later time after forearm issue has stabilized.   Critical care time 40 minutes.

## 2013-09-08 NOTE — Interval H&P Note (Signed)
Cath Lab Visit (complete for each Cath Lab visit)  Clinical Evaluation Leading to the Procedure:   ACS: No.  Non-ACS:    Anginal Classification: CCS III  Anti-ischemic medical therapy: Maximal Therapy (2 or more classes of medications)  Non-Invasive Test Results: No non-invasive testing performed  Prior CABG: No previous CABG      History and Physical Interval Note:  09/08/2013 2:29 PM  [name]  has presented today for surgery, with the diagnosis of cad  The various methods of treatment have been discussed with the patient and family. After consideration of risks, benefits and other options for treatment, the patient has consented to  Procedure(s): PERCUTANEOUS CORONARY ROTOBLATOR INTERVENTION (PCI-R) (N/A) as a surgical intervention .  The patient's history has been reviewed, patient examined, no change in status, stable for surgery.  I have reviewed the patient's chart and labs.  Questions were answered to the patient's satisfaction.     Jamie Hafford S.

## 2013-09-08 NOTE — CV Procedure (Signed)
PROCEDURE:  Selective coronary angiography, left ventriculogram.  Intravascular ultrasound of the proximal LAD and left main. Rotational atherectomy of the proximal LAD. Temporary pacemaker placement. PCI of the proximal LAD.  INDICATIONS:  Class III angina, decreased left ventricular function  The risks, benefits, and details of the procedure were explained to the patient.  The patient verbalized understanding and wanted to proceed.  Informed written consent was obtained.  PROCEDURE TECHNIQUE:  After Xylocaine anesthesia a 41F sheath was placed in the right femoral artery with a single anterior needle wall stick.   Left coronary angiography was done using an EBU 4.0 guide catheter.  Right coronary angiography was done using a Judkins R4 guide catheter.  IV heparin was given. ACT was used to check that the heparin was therapeutic. Intravascular ultrasound of the LAD and left main were performed. The decision was made to proceed with rotational atherectomy of the proximal LAD. A 6 French sheath was placed into the right femoral vein using modified Seldinger technique. A temporary pacer was placed into the right ventricle. The intervention was performed. Please see below for details. At the end of the case, an 8 [place] Angio-Seal was used for hemostasis. The 6 French venous sheath will be removed using manual compression.   CONTRAST:  Total of 195 cc.  COMPLICATIONS:  None.    HEMODYNAMICS:  Aortic pressure was 163/71;  ANGIOGRAPHIC DATA:   The left main coronary artery is is mild, distal disease which is calcific.  The left anterior descending artery is is heavily calcified in the proximal vessel. There is a patent stent in the mid vessel. There is a diagonal which is large which has an ostial stenosis.  The left circumflex artery is a large vessel with mild diffuse atherosclerosis.  The right coronary artery is a medium-sized, dominant vessel. There is mild, diffuse atherosclerosis in the  RCA.  LEFT VENTRICULOGRAM:  Left ventricular angiogram was not done.  PCI NARRATIVE: A CLS 3.5 guiding catheter was initially advanced to the left main. It would not reach. We then used an EBU 4 guide catheter. A pro-water wire was placed into the LAD. An intravascular ultrasound was performed over the pro-water wire. This revealed significant disease in the proximal LAD with a cross-sectional area of 3.6 mm2. The ostial LAD cross-sectional area was 4.7 mm. In the distal left main, the cross-sectional area was 7.6 mm.  For additional support, and flexibility with burr sizes, we upsized to an 8 [place] system. A diagnostic JL4 was tested and found to fit the left main apparently fairly well. An 8 [place] guiding catheters advanced to the left main.  The rotawire was advanced into the distal LAD. The 1.5 burr was advanced to the tip of the guide catheter.  It would not cross into the LAD and wire position was lost. We then switched out for an extra-support rota wire. The 1.5 burr was advanced and multiple passes were made. There was significant debulking performed in the proximal LAD. We then tried to advance a 1.75 burr were unsuccessful. At that point, we changed guides to a Q4 guide.  This did not sit well in the left main. We then switched out to an index the LAD 4. This gave excellent support.  A pro-water wire was placed with an over-the-wire balloon into the LAD.  A pro-water was changed out for a [place] extra support wire. The 1.75 burr was advanced. 2 passes were made. There is significant improvement in the lesion.  A 2.75 x 10 cutting balloon was then advanced to the lesion and dilated. A 3.0 x 16 promus drug-eluting stent was deployed. This overlapped the previously placed stent from 2010. The stent was post dilated with a 3.25 x 12 noncompliant balloon. There was an excellent angiographic result. Intracoronary nitroglycerin was given.  Of note, this is a complex procedure due to guide support issues,  complex calcified coronary disease, and multiple device usage. The temporary pacemaker did fire during the procedure with Rotablator.  IMPRESSIONS:  1.  Successful rotational atherectomy followed by stent placement to the proximal left anterior descending artery with a 3.0 x 16 Promus drug-eluting stent, postdilated to 3.3 mm in diameter.  Preintervention intravascular ultrasound of the LAD revealed a proximal lesion with a cross-sectional area of 3.6 mm2; an ostial lesion with a cross-sectional area of 4.7 mm2; and a distal left main cross-sectional area of 7.41mm2. 2.  Patent left circumflex artery and its branches despite LAD stent coming close to the ostium. 3.  Patent  right coronary artery.   RECOMMENDATION:  Continue dual antiplatelet therapy indefinitely. He'll be watched overnight in the ICU. He has a chronically slow heart rate. He will need a Holter monitor as an outpatient to see what if pacemaker is actually indicated. Will repeat echocardiogram in 4-6 weeks to see if his ejection fraction is improved.

## 2013-09-08 NOTE — Progress Notes (Signed)
Called to bedside for pt with Bradycardic arrest. Pt /sp PCI of prox LAD with rotational atherectomy today . Apparently pt became nauseous and HR was in the 20's with LOC.  He was given CPR briefly  With 1 mg Atropine providing robust HR increase to the 140's  And BP now 130/60.EKG shows LBBB with likely sinus tach ( p waves hard to discern in baseline and current EKG ) .  Pt currently answering question and following commands appropriately. Exam shows hematoma at the site of the right fem cath site with track oozing noted. Asked to hold manual pressure to ensure hemostasis .   Regino Bellow , M.D  Cardiology , [facility]

## 2013-09-09 ENCOUNTER — Encounter (INDEPENDENT_AMBULATORY_CARE_PROVIDER_SITE_OTHER): Payer: Medicare Other | Admitting: Ophthalmology

## 2013-09-09 DIAGNOSIS — R0602 Shortness of breath: Secondary | ICD-10-CM | POA: Diagnosis not present

## 2013-09-09 DIAGNOSIS — I5022 Chronic systolic (congestive) heart failure: Secondary | ICD-10-CM | POA: Diagnosis not present

## 2013-09-09 DIAGNOSIS — I498 Other specified cardiac arrhythmias: Secondary | ICD-10-CM

## 2013-09-09 DIAGNOSIS — I2510 Atherosclerotic heart disease of native coronary artery without angina pectoris: Secondary | ICD-10-CM | POA: Diagnosis not present

## 2013-09-09 DIAGNOSIS — I209 Angina pectoris, unspecified: Secondary | ICD-10-CM | POA: Diagnosis not present

## 2013-09-09 DIAGNOSIS — I428 Other cardiomyopathies: Secondary | ICD-10-CM

## 2013-09-09 DIAGNOSIS — I4891 Unspecified atrial fibrillation: Secondary | ICD-10-CM

## 2013-09-09 DIAGNOSIS — I469 Cardiac arrest, cause unspecified: Secondary | ICD-10-CM | POA: Diagnosis not present

## 2013-09-09 DIAGNOSIS — IMO0002 Reserved for concepts with insufficient information to code with codable children: Secondary | ICD-10-CM | POA: Diagnosis not present

## 2013-09-09 LAB — BASIC METABOLIC PANEL
ANION GAP: 18 — AB (ref 5–15)
BUN: 12 mg/dL (ref 6–23)
CALCIUM: 8.7 mg/dL (ref 8.4–10.5)
CO2: 20 mEq/L (ref 19–32)
CREATININE: 1.21 mg/dL (ref 0.50–1.35)
Chloride: 98 mEq/L (ref 96–112)
GFR calc non Af Amer: 53 mL/min — ABNORMAL LOW (ref >90)
GFR, EST AFRICAN AMERICAN: 61 mL/min — AB (ref >90)
Glucose, Bld: 165 mg/dL — ABNORMAL HIGH (ref 70–99)
Potassium: 4.4 mEq/L (ref 3.7–5.3)
Sodium: 136 mEq/L — ABNORMAL LOW (ref 137–147)

## 2013-09-09 LAB — POCT I-STAT, CHEM 8
BUN: 10 mg/dL (ref 6–23)
CALCIUM ION: 1.27 mmol/L (ref 1.13–1.30)
CHLORIDE: 100 meq/L (ref 96–112)
Creatinine, Ser: 1.3 mg/dL (ref 0.50–1.35)
Glucose, Bld: 162 mg/dL — ABNORMAL HIGH (ref 70–99)
HCT: 41 % (ref 39.0–52.0)
Hemoglobin: 13.9 g/dL (ref 13.0–17.0)
POTASSIUM: 4.2 meq/L (ref 3.7–5.3)
Sodium: 136 mEq/L — ABNORMAL LOW (ref 137–147)
TCO2: 21 mmol/L (ref 0–100)

## 2013-09-09 LAB — CBC
HCT: 38.1 % — ABNORMAL LOW (ref 39.0–52.0)
Hemoglobin: 12.9 g/dL — ABNORMAL LOW (ref 13.0–17.0)
MCH: 31.5 pg (ref 26.0–34.0)
MCHC: 33.9 g/dL (ref 30.0–36.0)
MCV: 92.9 fL (ref 78.0–100.0)
PLATELETS: 186 10*3/uL (ref 150–400)
RBC: 4.1 MIL/uL — ABNORMAL LOW (ref 4.22–5.81)
RDW: 14.1 % (ref 11.5–15.5)
WBC: 14.1 10*3/uL — ABNORMAL HIGH (ref 4.0–10.5)

## 2013-09-09 LAB — MAGNESIUM: MAGNESIUM: 2.3 mg/dL (ref 1.5–2.5)

## 2013-09-09 MED ORDER — CLOPIDOGREL BISULFATE 75 MG PO TABS
75.0000 mg | ORAL_TABLET | Freq: Every day | ORAL | Status: DC
  Administered 2013-09-10: 75 mg via ORAL
  Filled 2013-09-09: qty 1

## 2013-09-09 MED ORDER — DOPAMINE-DEXTROSE 3.2-5 MG/ML-% IV SOLN: 2.0000 ug/kg/min | INTRAVENOUS | Status: DC

## 2013-09-09 MED ORDER — DOPAMINE-DEXTROSE 3.2-5 MG/ML-% IV SOLN
INTRAVENOUS | Status: AC
  Filled 2013-09-09: qty 250

## 2013-09-09 MED ORDER — CLOPIDOGREL BISULFATE 300 MG PO TABS
300.0000 mg | ORAL_TABLET | Freq: Once | ORAL | Status: AC
  Administered 2013-09-09: 300 mg via ORAL
  Filled 2013-09-09: qty 1

## 2013-09-09 MED ORDER — DOPAMINE-DEXTROSE 3.2-5 MG/ML-% IV SOLN
2.0000 ug/kg/min | INTRAVENOUS | Status: DC
  Administered 2013-09-09: 1 ug/kg/min via INTRAVENOUS

## 2013-09-09 NOTE — Consult Note (Signed)
Reason for Consult: symptomatic bradycardia  Referring Physician: [name] is an 80 y.o. male.   HPI: The patient is a very pleasant 80 year old man with an ischemic cardiomyopathy, chronic systolic heart failure, class II, left bundle branch block, who had developed worsening shortness of breath, and was admitted to the hospital and underwent catheterization and complex percutaneous intervention of a LAD and left main stenosis. His procedure was otherwise uncomplicated. The patient had eaten some food and developed severe nausea after the procedure, which was followed by the development of severe bradycardia, asystole, with CPR and eventual restoration of sinus rhythm. The patient states that he has had one episode similar to this several weeks ago when he developed  severe pain in his arm while undergoing a right arm catheterization. It is unclear how much bradycardia he had at that time. Otherwise, he has never had syncope. He asked that has very little in the way of angina. He has class 2-3 heart failure symptoms. In addition, the patient has chronic bradycardia, exacerbated by amiodarone. He also has chronic left bundle branch block. PMH: Past Medical History  Diagnosis Date  . Myocardial infarction 1980's- medical intervention  . CAD (coronary artery disease)     a. s/p MI and prior PCI of LAD;  b. LHC (08/2013):  prox LAD 60-70%, mid LAD stents ok, ostial lesion at Dx jailed by stent, mild CFX and RCA disesase >>> PCI of LAD planned   . PAF (paroxysmal atrial fibrillation)     controlled w/ amiodarone; not on coumadin due to hx of GI bleed  . Sleep apnea     non-compliant cpap  . Mitral regurgitation   . GERD (gastroesophageal reflux disease)     occ. take prevacid  . Meningioma right -sided w/ right VI palsy    followed by dr Gaynell Face  . Diverticulitis of colon with bleeding     s/p sigmoid resection '88  . History of GI diverticular bleed april 2012   transfused blood and resolved without surgical intervention  . Impotence, organic     s/p penile prosthesis 1990's  . DJD (degenerative joint disease) hips and knees    s/p bilateral total replacements  . DDD (degenerative disc disease)     chronic back pain  . BPH (benign prostatic hypertrophy) with urinary obstruction     s/p turp yrs ago  . Blood transfusion   . Impaired hearing bilateral     only left hearing aid  . Bradycardia     Amio d/c'd 08/2013  . Cardiomyopathy     a. Echo (08/2013):  EF 30-35%, AS hypokinesis, Gr 1 diast dysfn, mild MR, mild LAE    PSHX: Past Surgical History  Procedure Laterality Date  . Cholecystectomy    . Joint replacement  03-25-08--total right hip     s/ revision's x2 and sev. closed reduction for dislocation last one 06-20-09  . Joint replacement  2005    total left hip  . Joint replacement  2004    total right knee and revision  . Joint replacement  1997    total left knee  . Hernia repair      bilateral inguinal hernia repair  . Cataract extraction w/ intraocular lens  implant, bilateral    . Appendectomy    . Transurethral resection of prostate  yrs ago  . Penile prosthesis implant  1990's  . Rotator cuff repair  left  . Right ear surgery  yrs ago  .  Cardiac catheterization  2007    noncritical cad (results w/ chart)  . Coronary angioplasty with stent placement  08-03-08    drug-eluting stent x2 distal and mid lad  . Transurethral resection of prostate  01/08/2011    Procedure: TRANSURETHRAL RESECTION OF THE PROSTATE (TURP);  Surgeon: Franchot Gallo;  Location: [facility];  Service: Urology;  Laterality: N/A;  GYRUS OWER    FAMHX: Family History  Problem Relation Age of Onset  . Heart disease Mother     Social History:  reports that he quit smoking about 52 years ago. He has never used smokeless tobacco. He reports that he does not drink alcohol or use illicit drugs.  Allergies:  Allergies  Allergen  Reactions  . Codeine Other (See Comments)    Insomnia/ hyper  . Warfarin Sodium Other (See Comments)    Hx gi bleed    Medications: Reviewed  No results found.  ROS  As stated in the HPI and negative for all other systems.  Physical Exam  Vitals:Blood pressure 106/46, pulse 54, temperature 97.6 F (36.4 C), temperature source Oral, resp. rate 14, height 5\' 7"  (1.702 m), weight 184 lb 15.5 oz (83.9 kg), SpO2 100.00%.  Well appearing elderly man, NAD HEENT: Unremarkable Neck:  No JVD, no thyromegally Back:  No CVA tenderness Lungs:  Clear with no wheezes, rales, or rhonchi. HEART:  Regular rate rhythm, no murmurs, no rubs, no clicks Abd:  soft, positive bowel sounds, no organomegally, no rebound, no guarding Ext:  2 plus pulses, no edema, no cyanosis, no clubbing Skin:  No rashes no nodules Neuro:  CN II through XII intact, motor grossly intact  ECG - sinus bradycardia with left bundle branch block  Telemetry - sinus bradycardia  Assessment/Plan: 1. symptomatic bradycardia 2. left bundle branch block 3. ischemic cardiomyopathy 4. chronic systolic heart failure, class 2-3 5. paroxysmal atrial fibrillation on amiodarone Discussion: The patient's complex of symptoms making his treatment more difficult. The episode resulting in his bradycardia was do to autonomic dysfunction. The time course of being, followed by nausea, followed by profound bradycardia support a diagnosis of vasomotor syncope with cardiac inhibition. Recently, pacemaker insertion utilizing closed loop stimulation has been shown to be beneficial in many of these patients. While the patient has an indication for an ICD based on left ventricular dysfunction and heart failure, he has undergone recent percutaneous coronary intervention, and his advanced age makes him a suboptimal candidate for this therapy. He would be a candidate for biventricular pacing which has both a survival at the edge and a potential to improve  his risk for recurrent syncope secondary to bradycardia. Based on his symptoms, I do not think this is [place] syncope. In the short term, I would recommend avoidance of beta blockers, continuation of his other medications, and we would consider repeat echo and biventricular pacemaker insertion with closed loop stimulation in the future. If he has recurrent bradycardia prior to discharge, then permanent pacemaker insertion her early on after percutaneous coronary intervention would be warranted.  [name], M.D.  Carleene Overlie TaylorMD 09/09/2013, 5:19 PM

## 2013-09-09 NOTE — Progress Notes (Addendum)
SUBJECTIVE:  Bradycardic code last night, over 4 hours after the conclusion of the heart cath.  Received atropine and epinephrine.  Brief CPR.  He felt nauseated and then HR dropped, followed by BP.  This morning, he feels better, but not back to normal.  HE has been on IV Dopa ( 2.5 mcg) for the past several hours with HR in the 60s.    OBJECTIVE:   Vitals:   Filed Vitals:   09/09/13 0530 09/09/13 0600 09/09/13 0630 09/09/13 0700  BP: 115/64 104/54 107/55 107/52  Pulse: 78 86 69 73  Temp:      TempSrc:      Resp:      Height:      Weight:      SpO2: 95% 96% 99% 100%   I&O's:   Intake/Output Summary (Last 24 hours) at 09/09/13 0748 Last data filed at 09/09/13 0700  Gross per 24 hour  Intake 965.96 ml  Output    825 ml  Net 140.96 ml   TELEMETRY: Reviewed telemetry pt in sinus bradycardia:     PHYSICAL EXAM General: Appears weak Head:   Normal cephalic and atramatic  Lungs:  Clear bilaterally to auscultation. Heart:  HRRR S1 S2    Abdomen: abdomen soft and non-tender Msk:  Back normal,  Normal strength and tone for age. Extremities:   No edema.  Right groin bruising, soft. Right forearm bruised; 3+ right radial pulse Neuro: Alert and oriented. Psych:  Normal affect, responds appropriately   LABS: Basic Metabolic Panel:  Recent Labs  09/08/13 2314 09/08/13 2316 09/09/13 0005  NA 136*  --  136*  K 4.2  --  4.4  CL 100  --  98  CO2  --   --  20  GLUCOSE 162*  --  165*  BUN 10  --  12  CREATININE 1.30  --  1.21  CALCIUM  --   --  8.7  MG  --  2.3  --    Liver Function Tests: No results found for this basename: AST, ALT, ALKPHOS, BILITOT, PROT, ALBUMIN,  in the last 72 hours No results found for this basename: LIPASE, AMYLASE,  in the last 72 hours CBC:  Recent Labs  09/08/13 2314 09/09/13 0005  WBC  --  14.1*  HGB 13.9 12.9*  HCT 41.0 38.1*  MCV  --  92.9  PLT  --  186   Cardiac Enzymes: No results found for this basename: CKTOTAL, CKMB,  CKMBINDEX, TROPONINI,  in the last 72 hours BNP: No components found with this basename: POCBNP,  D-Dimer: No results found for this basename: DDIMER,  in the last 72 hours Hemoglobin A1C: No results found for this basename: HGBA1C,  in the last 72 hours Fasting Lipid Panel: No results found for this basename: CHOL, HDL, LDLCALC, TRIG, CHOLHDL, LDLDIRECT,  in the last 72 hours Thyroid Function Tests: No results found for this basename: TSH, T4TOTAL, FREET3, T3FREE, THYROIDAB,  in the last 72 hours Anemia Panel: No results found for this basename: VITAMINB12, FOLATE, FERRITIN, TIBC, IRON, RETICCTPCT,  in the last 72 hours Coag Panel:   Lab Results  Component Value Date   INR 1.10 09/04/2013   INR 1.02 06/21/2009   INR 1.10 06/20/2009    RADIOLOGY: No results found.    ASSESSMENT: PLAN:    1) CAD: s/p complex PCI of LAD with rotational atherectomy followed by stent.  Continue dual antiplatelet therapy indefinitely.    2) Cardiomyopathy: presumed  to be ischemic-anterior wall motion with low EF prompting this cath and PCI.  WIll recheck echo in 4-6 weeks to see if EF has improved.  Low BP.  ARB being held.  Was not on beta blocker due to resting bradycardia.  3) Bradycardia: Noted to have sinus bradycardia last week (09/04/13) at the time of cath.  LBBB has been present for several years at least.  D/w Dr. Rayann Heman who recommended outpatient Holter monitor with further consideration of BiV pacer given LBBB and prolonged PR interval.  Amiodarone for PAF was stopped last week as well.  Last night, bradycardic arrest.  Now on low dose Dopamine.  Of not , patient has had generalized fatigue at home.  IT was unclear whether this was from the cardiomyopathy, CAD or bradycardia.  Prior angina was DOE in 2010 when Dr. Leonia Reeves stented his LAD.  Will have EP see formally today.  THe patient had some abdominal discomfort and nausea preceding the slow heart rate.  There was no manual compression of the  groin happening at the time.  The event seems spontaneous.  Similar event after radial cath last week, but that occurred with prolonged manual compression of radial hematoma.    Stop low dose Dopamine this AM and see how his HR responds.  Updated son, Legrand Como, regarding events of last night and plan for EP consult.  Legrand Como is also a patient of Dr. Lovena Le for [facility].  Jettie Booze., MD  09/09/2013  7:48 AM

## 2013-09-09 NOTE — Progress Notes (Signed)
24F Sheath removed from right femoral vein and pressure held x10 minutes.  Dressing applied to site.  Bruising from procedure yesterday well marked with no real hematoma.  Site soft.  Vitals stable throughout sheath removal.  Instructions given for bedrest.

## 2013-09-09 NOTE — Progress Notes (Signed)
Patient complained of nausea. Patient, who was bradycardic in the 40s had become severely bradycardic in the 20s. Patient  Had a brief period of asystole and required about a minute of cpr ,  1mg  of epinephrine and 1mg  of atropine before  return of spontaneous circulation was achieved. Pt was alert and oriented at this time. Code team  and primary cardiologist at bedside.        The right groin site which previously had a marked bruise has expanded beyond the marked region and the gauze of the perclosed arterial site is blood stained. Upon assessment of the site, blood was slowly oozing from the arterial puncture site. Pressure held for 15 minutes  and thrombin pad applied with a pressure dressing.

## 2013-09-10 ENCOUNTER — Other Ambulatory Visit: Payer: Medicare Other | Admitting: Physician Assistant

## 2013-09-10 DIAGNOSIS — I209 Angina pectoris, unspecified: Secondary | ICD-10-CM | POA: Diagnosis not present

## 2013-09-10 DIAGNOSIS — I10 Essential (primary) hypertension: Secondary | ICD-10-CM | POA: Diagnosis not present

## 2013-09-10 DIAGNOSIS — I2510 Atherosclerotic heart disease of native coronary artery without angina pectoris: Secondary | ICD-10-CM | POA: Diagnosis not present

## 2013-09-10 DIAGNOSIS — I25118 Atherosclerotic heart disease of native coronary artery with other forms of angina pectoris: Secondary | ICD-10-CM

## 2013-09-10 DIAGNOSIS — I4891 Unspecified atrial fibrillation: Secondary | ICD-10-CM | POA: Diagnosis not present

## 2013-09-10 DIAGNOSIS — I498 Other specified cardiac arrhythmias: Secondary | ICD-10-CM | POA: Diagnosis not present

## 2013-09-10 DIAGNOSIS — R0602 Shortness of breath: Secondary | ICD-10-CM | POA: Diagnosis not present

## 2013-09-10 MED ORDER — ASPIRIN 81 MG PO CHEW: 81.0000 mg | CHEWABLE_TABLET | Freq: Every day | ORAL | Status: DC

## 2013-09-10 MED ORDER — AMIODARONE HCL 100 MG PO TABS: 100.0000 mg | ORAL_TABLET | Freq: Every day | ORAL | Status: DC

## 2013-09-10 MED ORDER — NITROGLYCERIN 0.4 MG SL SUBL: 0.4000 mg | SUBLINGUAL_TABLET | SUBLINGUAL | Status: DC | PRN

## 2013-09-10 MED ORDER — AMIODARONE HCL 100 MG PO TABS
100.0000 mg | ORAL_TABLET | Freq: Every day | ORAL | Status: DC
  Filled 2013-09-10: qty 1

## 2013-09-10 NOTE — Progress Notes (Signed)
SUBJECTIVE:  Bradycardia persists. No dizziness.  He feels much better and wants to go home.    OBJECTIVE:   Vitals:   Filed Vitals:   09/10/13 0500 09/10/13 0600 09/10/13 0700 09/10/13 0730  BP:  94/28 98/46 98/46   Pulse: 49 47 55   Temp:    97.8 F (36.6 C)  TempSrc:    Oral  Resp:      Height:      Weight:      SpO2: 100% 100% 87% 99%   I&O's:    Intake/Output Summary (Last 24 hours) at 09/10/13 0749 Last data filed at 09/10/13 0020  Gross per 24 hour  Intake    3.9 ml  Output   1000 ml  Net -996.1 ml   TELEMETRY: Reviewed telemetry pt in sinus bradycardia:     PHYSICAL EXAM General: Appears weak Head:   Normal cephalic and atramatic  Lungs:  Clear bilaterally to auscultation. Heart:  bradycardic S1 S2    Abdomen: abdomen soft and non-tender Msk:  Back normal,  Normal strength and tone for age. Extremities:   No edema.  Right groin bruising, soft. Right forearm bruised; 3+ right radial pulse Neuro: Alert and oriented. Psych:  Normal affect, responds appropriately   LABS: Basic Metabolic Panel:  Recent Labs  09/08/13 2314 09/08/13 2316 09/09/13 0005  NA 136*  --  136*  K 4.2  --  4.4  CL 100  --  98  CO2  --   --  20  GLUCOSE 162*  --  165*  BUN 10  --  12  CREATININE 1.30  --  1.21  CALCIUM  --   --  8.7  MG  --  2.3  --    Liver Function Tests: No results found for this basename: AST, ALT, ALKPHOS, BILITOT, PROT, ALBUMIN,  in the last 72 hours No results found for this basename: LIPASE, AMYLASE,  in the last 72 hours CBC:  Recent Labs  09/08/13 2314 09/09/13 0005  WBC  --  14.1*  HGB 13.9 12.9*  HCT 41.0 38.1*  MCV  --  92.9  PLT  --  186   Cardiac Enzymes: No results found for this basename: CKTOTAL, CKMB, CKMBINDEX, TROPONINI,  in the last 72 hours BNP: No components found with this basename: POCBNP,  D-Dimer: No results found for this basename: DDIMER,  in the last 72 hours Hemoglobin A1C: No results found for this basename:  HGBA1C,  in the last 72 hours Fasting Lipid Panel: No results found for this basename: CHOL, HDL, LDLCALC, TRIG, CHOLHDL, LDLDIRECT,  in the last 72 hours Thyroid Function Tests: No results found for this basename: TSH, T4TOTAL, FREET3, T3FREE, THYROIDAB,  in the last 72 hours Anemia Panel: No results found for this basename: VITAMINB12, FOLATE, FERRITIN, TIBC, IRON, RETICCTPCT,  in the last 72 hours Coag Panel:   Lab Results  Component Value Date   INR 1.10 09/04/2013   INR 1.02 06/21/2009   INR 1.10 06/20/2009    RADIOLOGY: No results found.    ASSESSMENT: PLAN:    1) CAD: s/p complex PCI of LAD with rotational atherectomy followed by stent.  Continue dual antiplatelet therapy indefinitely.    2) Cardiomyopathy: presumed to be ischemic-anterior wall motion with low EF prompting this cath and PCI.  WIll recheck echo in 4-6 weeks to see if EF has improved.  Low BP.  ARB being held.  Was not on beta blocker due to resting bradycardia.  3) Bradycardia: Evaluated by EP.  No need for pacer at this time.  WIll follow.  If he has any further sx with bradycardia, would reconsider BiV pacer.    Off low dose Dopamine for 24 hours.  Plan discharge today if he feels well walking.  Jettie Booze., MD  09/10/2013  7:49 AM

## 2013-09-10 NOTE — Discharge Instructions (Signed)
Angina Pectoris Angina pectoris is extreme discomfort in your chest, neck, or arm. Your doctor may call it just angina. It is caused by a lack of oxygen to your heart wall. It may feel like tightness or heavy pressure. It may feel like a crushing or squeezing pain. Some people say it feels like gas. It may go down your shoulders, back, and arms. Some people have symptoms other than pain. These include:  Tiredness.  Shortness of breath.  Cold sweats.  Feeling sick to your stomach (nausea). There are four types of angina:  Stable angina. This type often lasts the same amount of time each time it happens. Activity, stress, or excitement can bring it on. It often gets better after taking a medicine called nitroglycerin. This goes under your tongue.  Unstable angina. This type can happen when you are not active or even during sleep. It can suddenly get worse or happen more often. It may not get better after taking the special medicine. It can last up to 30 minutes.  Microvascular angina. This type is more common in women. It may be more severe or last longer than other types.  Prinzmetal angina. This type often happens when you are not active or in the early morning hours. HOME CARE   Only take medicines as told by your doctor.  Stay active or exercise more as told by your doctor.  Limit very hard activity as told by your doctor.  Limit heavy lifting as told by your doctor.  Keep a healthy weight.  Learn about and eat foods that are healthy for your heart.  Do not use any tobacco such as cigarettes, chewing tobacco, or e-cigarettes. GET HELP RIGHT AWAY IF:   You have chest, neck, deep shoulder, or arm pain or discomfort that lasts more than a few minutes.  You have chest, neck, deep shoulder, or arm pain or discomfort that goes away and comes back over and over again.  You have heavy sweating that seems to happen for no reason.  You have shortness of breath or trouble  breathing.  Your angina does not get better after a few minutes of rest.  Your angina does not get better after you take nitroglycerin medicine. These can all be symptoms of a heart attack. Get help right away. Call your local emergency service (911 in [place] Do not  drive yourself to the hospital. Do not  wait to for your symptoms to go away. MAKE SURE YOU:   Understand these instructions.  Will watch your condition.  Will get help right away if you are not doing well or get worse. Document Released: 08/01/2007 Document Revised: 02/17/2013 Document Reviewed: 11/22/2011 [facility] Patient Information 2015 [place], [place]. This information is not intended to replace advice given to you by your health care provider. Make sure you discuss any questions you have with your health care provider.  Atrial Fibrillation Atrial fibrillation is a condition that causes your heart to beat irregularly. It may also cause your heart to beat faster than normal. Atrial fibrillation can prevent your heart from pumping blood normally. It increases your risk of stroke and heart problems. HOME CARE  Take medications as told by your doctor.  Only take medications that your doctor says are safe. Some medications can make the condition worse or happen again.  If blood thinners were prescribed by your doctor, take them exactly as told. Too much can cause bleeding. Too little and you will not have the needed protection against stroke  and other problems.  Perform blood tests at home if told by your doctor.  Perform blood tests exactly as told by your doctor.  Do not drink alcohol.  Do not drink beverages with caffeine such as coffee, soda, and some teas.  Maintain a healthy weight.  Do not use diet pills unless your doctor says they are safe. They may make heart problems worse.  Follow diet instructions as told by your doctor.  Exercise regularly as told by your doctor.  Keep all follow-up  appointments. GET HELP RIGHT AWAY IF:   You have chest or belly (abdominal) pain.  You feel sick to your stomach (nauseous)  You suddenly have swollen feet and ankles.  You feel dizzy.  You face, arms, or legs feel numb or weak.  There is a change in your vision or speech.  You notice a change in the speed, rhythm, or strength of your heartbeat.  You suddenly begin peeing (urinating) more often.  You get tired more easily when moving or exercising. MAKE SURE YOU:   Understand these instructions.  Will watch your condition.  Will get help right away if you are not doing well or get worse. Document Released: 11/22/2007 Document Revised: 06/09/2012 Document Reviewed: 03/25/2012 [facility] Patient Information 2015 [place], [place]. This information is not intended to replace advice given to you by your health care provider. Make sure you discuss any questions you have with your health care provider.

## 2013-09-10 NOTE — Progress Notes (Signed)
[id number] Cardiac Rehab Pt ambulated with RN.I completed stent and CHF education with pt and granddaughter. They voice understanding. Pt agrees to [facility]. [facility] in [place] will send referral.  We discussed low sodium and heart health diet. Instructed pt to call MD for SOB, swelling and weight gain. Pt did excellent with teach back. Deon Pilling, RN 09/10/2013 10:28 AM

## 2013-09-10 NOTE — Discharge Summary (Signed)
Discharge Summary   Patient ID: [name],  MRN: [id number], DOB/AGE: Oct 29, 1926 80 y.o.  Admit date: 09/08/2013 Discharge date: 09/10/2013  Primary Care Provider: [name] Primary Cardiologist: Dr. Irish Lack Discharge Diagnoses Principal Problem:   Angina, class III Active Problems:   Essential hypertension, benign   Coronary atherosclerosis of native coronary artery   Bradycardia   Atrial fibrillation   Allergies Allergies  Allergen Reactions  . Codeine Other (See Comments)    Insomnia/ hyper  . Warfarin Sodium Other (See Comments)    Hx gi bleed    Procedures  PROCEDURE: Selective coronary angiography, left ventriculogram. Intravascular ultrasound of the proximal LAD and left main. Rotational atherectomy of the proximal LAD. Temporary pacemaker placement. PCI of the proximal LAD.   ANGIOGRAPHIC DATA: The left main coronary artery is is mild, distal disease which is calcific.  The left anterior descending artery is is heavily calcified in the proximal vessel. There is a patent stent in the mid vessel. There is a diagonal which is large which has an ostial stenosis.  The left circumflex artery is a large vessel with mild diffuse atherosclerosis.  The right coronary artery is a medium-sized, dominant vessel. There is mild, diffuse atherosclerosis in the RCA.  IMPRESSIONS:  1. Successful rotational atherectomy followed by stent placement to the proximal left anterior descending artery with a 3.0 x 16 Promus drug-eluting stent, postdilated to 3.3 mm in diameter. Preintervention intravascular ultrasound of the LAD revealed a proximal lesion with a cross-sectional area of 3.6 mm2; an ostial lesion with a cross-sectional area of 4.7 mm2; and a distal left main cross-sectional area of 7.72mm2. 2. Patent left circumflex artery and its branches despite LAD stent coming close to the ostium. 3. Patent right coronary artery. RECOMMENDATION: Continue dual antiplatelet  therapy indefinitely. He'll be watched overnight in the ICU. He has a chronically slow heart rate. He will need a Holter monitor as an outpatient to see what if pacemaker is actually indicated. Will repeat echocardiogram in 4-6 weeks to see if his ejection fraction is improved.    Hospital Course  The patient is an 80 year old Caucasian male with past medical history of coronary artery disease, paroxysmal atrial fibrillation maintaining normal sinus rhythm on amiodarone, GI bleed and GERD who has been having class III angina symptom. He was recently cathed on 09/04/2013 which revealed significant calcific disease in the proximal LAD with FFR 0.75, mild disease the left circumflex artery and the RCA. Due to the difficulty accessing from the radial site, it was planned to undergo 2 staged cardiac catheterization with PCI at a later time to definitively fix LAD with rotational atherectomy under IVUS guidance. He also noted to have some significant bradycardia during the case and his amiodarone was stopped. Per Dr. Irish Lack, he may eventually need a biventricular pacemaker at some point. Post cath, he developed some right radial hematoma. Vascular surgery saw the patient, eventually hematoma resolved with compression and his forearm continued to have good perfusion.   Patient returned to the cath lab on 09/08/2013 and underwent rotational atherectomy followed by DES placement in the proximal LAD. According to Dr. Irish Lack, patient will need dual antiplatelet therapy indefinitely. Patient has a chronically slow heart rate and may need outpatient Holter monitor to see if a pacemaker is indicated. Echocardiogram will be obtained as outpatient in 4-6 weeks to see his decreased ejection fraction show some improvement. Overnight, night shift cardiology fellow was called to the bedside for bradycardic arrest. Patient was initially nauseous however  his heart rate went down to the 20s with loss of consciousness. He was  given CPR briefly with 1 mg of atropine which increased his heart rate to 140s. He was briefly put on low-dose dopamine. Electrophysiology was consulted who believe that episode resulting in his bradycardia was due to autonomic dysfunction. Timing and his symptom support a diagnosis of vasomotor syncope. He may benefit from pacemaker insertion utilizing closed loop stimulation. While he has an indication for ICD based on left ventricular dysfunction and heart failure, his advanced age and the recent PCI which make him a suboptimal candidate for this therapy. Dr. Lovena Le recommended avoidance of beta blocker and continue his other medication. He will be considered for repeat echo and possibly biventricular pacemaker insertion with closed loop stimulation in the future if his EF does not improve.  Patient was seen the morning of 09/10/2013 at which time he continued to have bradycardia 24 hours after discontinuing low-dose dopamine, however no dizziness. According to the patient, he feels much better and wished to go home. While the patient went to the bathroom and brushing his teeth, telemetry noted that his heart rate went to 130s with questionable paroxysmal atrial fibrillation. EP was contacted for further recommendations, patient will placed back on 100 mg daily of amiodarone for rate control. He is deemed stable for discharge from cardiac perspective. After discussing with Dr. Irish Lack, he is at higher risk of bleeding and will not be placed on systemic anticoagulation at this time while he is maintaining NSR with amiodarone.  *At this time, it appears that he does have indication for BiV pacemaker, will need to reassess with echo 4-6 weeks. Will need to reassess later if continue to have symptom and if need holter monitor as well for symptomatic bradycardia. Patient has been advised to seek medical attention if he continued to have dizziness and feeling passing out.  Discharge Vitals Blood pressure 92/42,  pulse 55, temperature 98 F (36.7 C), temperature source Oral, resp. rate 17, height 5\' 7"  (1.702 m), weight 184 lb 15.5 oz (83.9 kg), SpO2 95.00%.  Filed Weights   09/08/13 1022 09/09/13 0407  Weight: 185 lb (83.915 kg) 184 lb 15.5 oz (83.9 kg)    Labs  CBC  Recent Labs  09/08/13 2314 09/09/13 0005  WBC  --  14.1*  HGB 13.9 12.9*  HCT 41.0 38.1*  MCV  --  92.9  PLT  --  287   Basic Metabolic Panel  Recent Labs  09/08/13 2314 09/08/13 2316 09/09/13 0005  NA 136*  --  136*  K 4.2  --  4.4  CL 100  --  98  CO2  --   --  20  GLUCOSE 162*  --  165*  BUN 10  --  12  CREATININE 1.30  --  1.21  CALCIUM  --   --  8.7  MG  --  2.3  --     Disposition  Pt is being discharged home today in good condition.  Follow-up Plans & Appointments      Follow-up Information   Follow up with Jettie Booze., MD. (Office will call patient in 2 days to schedule followup, if you do not hear from [place]. Please call us to schedule.)    Specialty:  Interventional Cardiology   Contact information:   [address] [place] [address] [phone]       Discharge Medications    Medication List  amiodarone 100 MG tablet  Commonly known as:  PACERONE  Take 1 tablet (100 mg total) by mouth daily.     aspirin 81 MG chewable tablet  Chew 1 tablet (81 mg total) by mouth daily.     clopidogrel 75 MG tablet  Commonly known as:  PLAVIX  Take 75 mg by mouth daily.     diazepam 5 MG tablet  Commonly known as:  VALIUM  Take 5 mg by mouth at bedtime as needed for anxiety.     famotidine 10 MG tablet  Commonly known as:  PEPCID  Take 10 mg by mouth daily.     Fish Oil 1000 MG Caps  Take 1 capsule by mouth daily.     losartan 50 MG tablet  Commonly known as:  COZAAR  Take 25 mg by mouth daily. 1/2 tablet daily or as directed     multivitamin tablet  Take 1 tablet by mouth daily.     OVER THE COUNTER MEDICATION  Take 1 tablet by mouth 2 (two)  times daily. Vitamins for eyes     OVER THE COUNTER MEDICATION  Take 2 tablets by mouth daily. [facility] brand of Prostrate health     psyllium 58.6 % packet  Commonly known as:  METAMUCIL  Take 1 packet by mouth daily as needed (for constipation).     sodium chloride 0.65 % nasal spray  Commonly known as:  OCEAN  Place 1 spray into the nose as needed for congestion.        Outstanding Labs/Studies  Follow up in office, potential echo 4-6 weeks  Duration of Discharge Encounter   Greater than 30 minutes including physician time.  Signed, Almyra Deforest PA-C 09/10/2013, 2:12 PM   I have examined the patient and reviewed assessment and plan and discussed with patient.  Agree with above as stated.  No need for pacer.  Restart low dose Amio to prevent AFib.  Not a candidate for COumadin.  Maribelle Hopple S.

## 2013-09-10 NOTE — Progress Notes (Signed)
Patient Name: [name]      SUBJECTIVE: no symptoms  WITH ATRial fibrillation No prior syncope  Past Medical History  Diagnosis Date  . Myocardial infarction 1980's- medical intervention  . CAD (coronary artery disease)     a. s/p MI and prior PCI of LAD;  b. LHC (08/2013):  prox LAD 60-70%, mid LAD stents ok, ostial lesion at Dx jailed by stent, mild CFX and RCA disesase >>> PCI of LAD planned   . PAF (paroxysmal atrial fibrillation)     controlled w/ amiodarone; not on coumadin due to hx of GI bleed  . Sleep apnea     non-compliant cpap  . Mitral regurgitation   . GERD (gastroesophageal reflux disease)     occ. take prevacid  . Meningioma right -sided w/ right VI palsy    followed by dr Gaynell Face  . Diverticulitis of colon with bleeding     s/p sigmoid resection '88  . History of GI diverticular bleed april 2012    transfused blood and resolved without surgical intervention  . Impotence, organic     s/p penile prosthesis 1990's  . DJD (degenerative joint disease) hips and knees    s/p bilateral total replacements  . DDD (degenerative disc disease)     chronic back pain  . BPH (benign prostatic hypertrophy) with urinary obstruction     s/p turp yrs ago  . Blood transfusion   . Impaired hearing bilateral     only left hearing aid  . Bradycardia     Amio d/c'd 08/2013  . Cardiomyopathy     a. Echo (08/2013):  EF 30-35%, AS hypokinesis, Gr 1 diast dysfn, mild MR, mild LAE    Scheduled Meds:  Scheduled Meds: . aspirin  81 mg Oral Daily  . clopidogrel  75 mg Oral Daily  . famotidine  10 mg Oral Daily  . losartan  25 mg Oral Daily   Continuous Infusions:  acetaminophen, diazepam, ondansetron (ZOFRAN) IV, sodium chloride    PHYSICAL EXAM Filed Vitals:   09/10/13 0810 09/10/13 0900 09/10/13 1035 09/10/13 1140  BP: 91/46 101/40 94/39 92/42   Pulse:      Temp:    98 F (36.7 C)  TempSrc:    Oral  Resp: 15 13 15 17   Height:      Weight:        SpO2:  99% 99% 95%    Well developed and nourished in no acute distress HENT normal Neck supple with JVP-flat Clear Regular rate and rhythm, no murmurs or gallops Abd-soft with active BS No Clubbing cyanosis edema Skin-warm and dry A & Oriented  Grossly normal sensory and motor function   TELEMETRY: Reviewed telemetry pt in AFib RVR>>NSR:    Intake/Output Summary (Last 24 hours) at 09/10/13 1242 Last data filed at 09/10/13 0800  Gross per 24 hour  Intake    480 ml  Output    550 ml  Net    -70 ml    LABS: Basic Metabolic Panel:  Recent Labs Lab 09/03/13 1641 09/05/13 0306 09/08/13 2314 09/08/13 2316 09/09/13 0005  NA 131* 136* 136*  --  136*  K 4.3 4.4 4.2  --  4.4  CL 99 101 100  --  98  CO2 25 22  --   --  20  GLUCOSE 102* 105* 162*  --  165*  BUN 20 18 10   --  12  CREATININE 1.3 1.11 1.30  --  1.21  CALCIUM 8.9 8.4  --   --  8.7  MG  --   --   --  2.3  --    Cardiac Enzymes: No results found for this basename: CKTOTAL, CKMB, CKMBINDEX, TROPONINI,  in the last 72 hours CBC:  Recent Labs Lab 09/03/13 1641 09/05/13 0306 09/08/13 2314 09/09/13 0005  WBC 7.2 6.4  --  14.1*  NEUTROABS 4.1  --   --   --   HGB 14.2 12.6* 13.9 12.9*  HCT 42.0 37.0* 41.0 38.1*  MCV 94.5 91.1  --  92.9  PLT 175.0 158  --  186   PROTIME: No results found for this basename: LABPROT, INR,  in the last 72 hours Liver Function Tests: No results found for this basename: AST, ALT, ALKPHOS, BILITOT, PROT, ALBUMIN,  in the last 72 hours No results found for this basename: LIPASE, AMYLASE,  in the last 72 hours BNP: BNP (last 3 results)  Recent Labs  07/10/13 0913 09/03/13 1641  PROBNP 99.0 39.0      ASSESSMENT AND PLAN:  Active Problems:   Bradycardia   Atrial fibrillation   Other and unspecified angina pectoris   Angina pectoris  pts prior episode was most consistent with neurally mediated and thus would not let it inform decisions going forward   His afib was  asymptomatic but if it became so, and required treatment he would need back up brady pacing   i owoul resume amio as no prior symptoms of bradycardia   Review indications for anticoagulation  Not sure hx of GI bleed is sufficient to warrant none   Signed, Virl Axe MD  09/10/2013

## 2013-09-11 LAB — BLOOD PRODUCT ORDER (VERBAL) VERIFICATION

## 2013-09-11 MED FILL — Medication: Qty: 1 | Status: AC

## 2013-09-15 ENCOUNTER — Telehealth: Payer: Self-pay | Admitting: Interventional Cardiology

## 2013-09-15 ENCOUNTER — Ambulatory Visit (INDEPENDENT_AMBULATORY_CARE_PROVIDER_SITE_OTHER): Payer: Medicare Other | Admitting: Physician Assistant

## 2013-09-15 ENCOUNTER — Encounter: Payer: Self-pay | Admitting: Physician Assistant

## 2013-09-15 VITALS — BP 108/60 | HR 52 | Ht 67.0 in | Wt 184.0 lb

## 2013-09-15 DIAGNOSIS — I480 Paroxysmal atrial fibrillation: Secondary | ICD-10-CM

## 2013-09-15 DIAGNOSIS — I4891 Unspecified atrial fibrillation: Secondary | ICD-10-CM | POA: Diagnosis not present

## 2013-09-15 DIAGNOSIS — I2510 Atherosclerotic heart disease of native coronary artery without angina pectoris: Secondary | ICD-10-CM

## 2013-09-15 DIAGNOSIS — I10 Essential (primary) hypertension: Secondary | ICD-10-CM

## 2013-09-15 DIAGNOSIS — IMO0002 Reserved for concepts with insufficient information to code with codable children: Secondary | ICD-10-CM

## 2013-09-15 DIAGNOSIS — I498 Other specified cardiac arrhythmias: Secondary | ICD-10-CM

## 2013-09-15 DIAGNOSIS — I209 Angina pectoris, unspecified: Secondary | ICD-10-CM | POA: Diagnosis not present

## 2013-09-15 DIAGNOSIS — I2589 Other forms of chronic ischemic heart disease: Secondary | ICD-10-CM

## 2013-09-15 DIAGNOSIS — I255 Ischemic cardiomyopathy: Secondary | ICD-10-CM

## 2013-09-15 DIAGNOSIS — S301XXS Contusion of abdominal wall, sequela: Secondary | ICD-10-CM

## 2013-09-15 DIAGNOSIS — R001 Bradycardia, unspecified: Secondary | ICD-10-CM

## 2013-09-15 NOTE — Progress Notes (Signed)
Cardiology Office Note    Date:  09/15/2013   ID:  [name], DOB Mar 11, 1926, MRN [id number]  PCP:  [name], MD  Cardiologist:  Dr. Casandra Doffing      History of Present Illness: [name] is a 80 y.o. male with a hx of CAD s/p prior PCI of LAD in 2010, paroxysmal AFib maintaining NSR on Amiodarone, prior GI bleed (no longer on coumadin), GERD, DJD. He was recently seen by Dr. Casandra Doffing for worsening dyspnea and episodes of chest pain. Follow up echo demonstrated worsening LVF with EF 30-35%. He was therefore set up for cardiac catheterization that demonstrated significant calcific proximal LAD disease. It was felt the patient would need PCI of his LAD. He had difficulty from the R radial access due to tortuosity of his shoulder. He developed a R forearm hematoma post cath with significant pain. He was seen by Dr. Kellie Simmering with Vascular Surgery.  Hematoma stabilized after compression.  He was admitted for PCI 7/14-7/16.  He underwent rotational atherectomy to the proximal LAD followed by stent placement with a Promus DES. Indefinite dual antiplatelet therapy was recommended.  Post PCI course was complicated by bradycardic arrest. He required brief CPR. Electrophysiology saw the patient. Symptoms were felt to be related to autonomic dysfunction. It was felt that beta blockers should be avoided. He may require biventricular pacemaker implantation in the future if his ejection fraction does not improve.  He had an episode of paroxysmal atrial fibrillation with RVR.  He was seen by electrophysiology again and placed back on low dose amiodarone (100 mg).  Patient called in today with complaints of right groin pain and swelling. He was added onto my schedule. Patient notes that the swelling in his groin was not present until yesterday. He seems to be somewhat anxious about this. He has felt somewhat lightheaded since he started to notice this. He denies any significant dyspnea. He  denies chest heaviness or tightness. He denies orthopnea, PND or significant pedal edema. He denies syncope.  Echo 08/31/13: Study Conclusions  - Left ventricle: Systolic function was moderately to severely reduced. The estimated ejection fraction was in the range of 30% to 35%. There is hypokinesis of the anteroseptal myocardium. Doppler parameters are consistent with abnormal left ventricular relaxation (grade 1 diastolic dysfunction). Doppler parameters are consistent with high ventricular filling pressure. - Mitral valve: There was mild regurgitation directed eccentrically. - Left atrium: The atrium was mildly dilated.   Recent Labs: 09/03/2013: Pro B Natriuretic peptide (BNP) 39.0  09/09/2013: Creatinine 1.21; Hemoglobin 12.9*; Potassium 4.4   Wt Readings from Last 3 Encounters:  09/15/13 184 lb (83.462 kg)  09/09/13 184 lb 15.5 oz (83.9 kg)  09/09/13 184 lb 15.5 oz (83.9 kg)     Past Medical History  Diagnosis Date  . Myocardial infarction 1980's- medical intervention  . CAD (coronary artery disease)     a. s/p MI and prior PCI of LAD;  b. LHC (08/2013):  prox LAD 60-70%, mid LAD stents ok, ostial lesion at Dx jailed by stent, mild CFX and RCA disesase >>> PCI of LAD planned   . PAF (paroxysmal atrial fibrillation)     controlled w/ amiodarone; not on coumadin due to hx of GI bleed  . Sleep apnea     non-compliant cpap  . Mitral regurgitation   . GERD (gastroesophageal reflux disease)     occ. take prevacid  . Meningioma right -sided w/ right VI palsy    followed  by dr Gaynell Face  . Diverticulitis of colon with bleeding     s/p sigmoid resection '88  . History of GI diverticular bleed april 2012    transfused blood and resolved without surgical intervention  . Impotence, organic     s/p penile prosthesis 1990's  . DJD (degenerative joint disease) hips and knees    s/p bilateral total replacements  . DDD (degenerative disc disease)     chronic back pain  . BPH (benign  prostatic hypertrophy) with urinary obstruction     s/p turp yrs ago  . Blood transfusion   . Impaired hearing bilateral     only left hearing aid  . Bradycardia     Amio d/c'd 08/2013  . Cardiomyopathy     a. Echo (08/2013):  EF 30-35%, AS hypokinesis, Gr 1 diast dysfn, mild MR, mild LAE    Current Outpatient Prescriptions  Medication Sig Dispense Refill  . amiodarone (PACERONE) 100 MG tablet Take 1 tablet (100 mg total) by mouth daily.  30 tablet  6  . aspirin 81 MG chewable tablet Chew 1 tablet (81 mg total) by mouth daily.      . clopidogrel (PLAVIX) 75 MG tablet Take 75 mg by mouth daily.      . diazepam (VALIUM) 5 MG tablet Take 5 mg by mouth at bedtime as needed for anxiety.      . famotidine (PEPCID) 10 MG tablet Take 10 mg by mouth daily.      [name] losartan (COZAAR) 50 MG tablet Take 25 mg by mouth daily. 1/2 tablet daily or as directed      . Multiple Vitamin (MULTIVITAMIN) tablet Take 1 tablet by mouth daily.        . nitroGLYCERIN (NITROSTAT) 0.4 MG SL tablet Place 1 tablet (0.4 mg total) under the tongue every 5 (five) minutes as needed for chest pain.  20 tablet  0  . Omega-3 Fatty Acids (FISH OIL) 1000 MG CAPS Take 1 capsule by mouth daily.      [name] OVER THE COUNTER MEDICATION Take 1 tablet by mouth 2 (two) times daily. Vitamins for eyes      . OVER THE COUNTER MEDICATION Take 2 tablets by mouth daily. [facility] brand of Prostrate health      . psyllium (METAMUCIL) 58.6 % packet Take 1 packet by mouth daily as needed (for constipation).      . sodium chloride (OCEAN) 0.65 % nasal spray Place 1 spray into the nose as needed for congestion.       No current facility-administered medications for this visit.    Allergies:   Codeine and Warfarin sodium   Social History:  The patient  reports that he quit smoking about 52 years ago. He has never used smokeless tobacco. He reports that he does not drink alcohol or use illicit drugs.   Family History:  The patient's family history  includes Cancer in an other family member; Heart attack in his mother; Heart disease in his mother.   ROS:  Please see the history of present illness.       All other systems reviewed and negative.   PHYSICAL EXAM: VS:  BP 108/60  Pulse 52  Ht 5\' 7"  (1.702 m)  Wt 184 lb (83.462 kg)  BMI 28.81 kg/m2 Well nourished, well developed, in no acute distress HEENT: normal Neck:  no JVD Cardiac:  normal S1, S2;  RRR; no murmur Lungs:   clear to auscultation bilaterally, no wheezing, rhonchi or rales  Abd: soft, nontender, no hepatomegaly Ext:  no edemaright groin with large amount of ecchymosis noted, moderate sized linear mass without bruit, non pulsatile Skin: warm and dry Neuro:  CNs 2-12 intact, no focal abnormalities noted  EKG:  Sinus bradycardia, HR 52, LBBB     ASSESSMENT AND PLAN:  1. Groin hematoma, sequela:  Patient has a large amount of ecchymosis as well as a linear mass in the right groin. I reviewed his hospital notes. Right groin bruising was noted in the hospital. Dr. Radford Pax (DOD) also examined the patient. He has a scar in his right groin. He is not certain what surgery he had. He may have had a hernia surgery. He does not have a bruit. The mass is non-pulsatile. We will obtain a CBC to rule out significant anemia. We will also obtain a right groin ultrasound. Reassurance. He will follow up with me as planned next week. 2. Atherosclerosis of native coronary artery of native heart without angina pectoris: He denies any further angina after recent PCI of his LAD. He remains on dual antiplatelet therapy.   3. Paroxysmal atrial fibrillation:  Maintaining NSR. He is not felt to be a candidate for anticoagulation.  He is on low-dose amiodarone. 4. Bradycardia:  Heart rate appears to be stable. He may be a candidate for biventricular pacing. If his lightheadedness continues, consider Holter monitor at followup. 5. Ischemic cardiomyopathy: Given bradycardia, he cannot take a blocker. He  remains on ARB. Echo will be repeated at some point in the near future to assess for recovery of LV function after PCI. 6. Essential hypertension, benign: Controlled. 7. Disposition: Follow up with me next week as planned.   Signed, Versie Starks, MHS 09/15/2013 4:27 PM    [facility] [place], [place], [place]  [address] Phone: [phone]; Fax: [phone]

## 2013-09-15 NOTE — Telephone Encounter (Addendum)
New message      Pt request to talk to Amy.  He hung up before I could take his message.

## 2013-09-15 NOTE — Telephone Encounter (Signed)
Spoke with pt and he states he has two knots at his groin cath site. He has significant bruising as well. He requests appt for site to be checked. Appt made for 3:40 pm with Richardson Dopp, PA.

## 2013-09-15 NOTE — Patient Instructions (Addendum)
LAB WORK TODAY CBC  GROIN [place] 09/16/13 9 AM  NO CHANGES WERE MADE TODAY

## 2013-09-16 ENCOUNTER — Encounter (INDEPENDENT_AMBULATORY_CARE_PROVIDER_SITE_OTHER): Payer: Medicare Other | Admitting: Ophthalmology

## 2013-09-16 ENCOUNTER — Telehealth: Payer: Self-pay | Admitting: Interventional Cardiology

## 2013-09-16 ENCOUNTER — Ambulatory Visit (HOSPITAL_COMMUNITY): Payer: Medicare Other | Attending: Cardiovascular Disease | Admitting: Cardiology

## 2013-09-16 DIAGNOSIS — I10 Essential (primary) hypertension: Secondary | ICD-10-CM | POA: Insufficient documentation

## 2013-09-16 DIAGNOSIS — Z87891 Personal history of nicotine dependence: Secondary | ICD-10-CM | POA: Insufficient documentation

## 2013-09-16 DIAGNOSIS — R109 Unspecified abdominal pain: Secondary | ICD-10-CM | POA: Insufficient documentation

## 2013-09-16 DIAGNOSIS — IMO0002 Reserved for concepts with insufficient information to code with codable children: Secondary | ICD-10-CM | POA: Diagnosis not present

## 2013-09-16 DIAGNOSIS — I2510 Atherosclerotic heart disease of native coronary artery without angina pectoris: Secondary | ICD-10-CM | POA: Diagnosis not present

## 2013-09-16 DIAGNOSIS — Z8719 Personal history of other diseases of the digestive system: Secondary | ICD-10-CM

## 2013-09-16 DIAGNOSIS — M79609 Pain in unspecified limb: Secondary | ICD-10-CM | POA: Diagnosis not present

## 2013-09-16 DIAGNOSIS — X58XXXA Exposure to other specified factors, initial encounter: Secondary | ICD-10-CM | POA: Diagnosis not present

## 2013-09-16 DIAGNOSIS — R1031 Right lower quadrant pain: Secondary | ICD-10-CM

## 2013-09-16 DIAGNOSIS — S301XXS Contusion of abdominal wall, sequela: Secondary | ICD-10-CM

## 2013-09-16 LAB — CBC WITH DIFFERENTIAL/PLATELET
BASOS ABS: 0 10*3/uL (ref 0.0–0.1)
Basophils Relative: 0.2 % (ref 0.0–3.0)
EOS PCT: 4.3 % (ref 0.0–5.0)
Eosinophils Absolute: 0.3 10*3/uL (ref 0.0–0.7)
HEMATOCRIT: 35.5 % — AB (ref 39.0–52.0)
HEMOGLOBIN: 11.8 g/dL — AB (ref 13.0–17.0)
Lymphocytes Relative: 20.1 % (ref 12.0–46.0)
Lymphs Abs: 1.4 10*3/uL (ref 0.7–4.0)
MCHC: 33.3 g/dL (ref 30.0–36.0)
MCV: 95.7 fl (ref 78.0–100.0)
MONOS PCT: 10.6 % (ref 3.0–12.0)
Monocytes Absolute: 0.8 10*3/uL (ref 0.1–1.0)
NEUTROS ABS: 4.6 10*3/uL (ref 1.4–7.7)
Neutrophils Relative %: 64.8 % (ref 43.0–77.0)
Platelets: 220 10*3/uL (ref 150.0–400.0)
RBC: 3.7 Mil/uL — AB (ref 4.22–5.81)
RDW: 15.2 % (ref 11.5–15.5)
WBC: 7.2 10*3/uL (ref 4.0–10.5)

## 2013-09-16 NOTE — Telephone Encounter (Signed)
Pt came in for a PV, wanted to make sure he got results ASAP. It was urgent. Please call pt

## 2013-09-16 NOTE — Progress Notes (Signed)
Lower arterial groin duplex performed.

## 2013-09-16 NOTE — Telephone Encounter (Signed)
lmtcb to inform pt that results have not been reviewed yet, as this was just done this morning.

## 2013-09-16 NOTE — Telephone Encounter (Signed)
Notified of lab results after speaking with Margaret Pyle.  Hgb is low and Scott wants to repeat CBC tomorrow.  Will come for repeat lab 7/23.

## 2013-09-17 ENCOUNTER — Encounter: Payer: Self-pay | Admitting: Physician Assistant

## 2013-09-17 ENCOUNTER — Ambulatory Visit (INDEPENDENT_AMBULATORY_CARE_PROVIDER_SITE_OTHER): Payer: Medicare Other | Admitting: Physician Assistant

## 2013-09-17 ENCOUNTER — Other Ambulatory Visit (INDEPENDENT_AMBULATORY_CARE_PROVIDER_SITE_OTHER): Payer: Medicare Other

## 2013-09-17 VITALS — BP 137/57 | HR 52 | Ht 67.0 in | Wt 180.0 lb

## 2013-09-17 DIAGNOSIS — Z8719 Personal history of other diseases of the digestive system: Secondary | ICD-10-CM

## 2013-09-17 DIAGNOSIS — R0789 Other chest pain: Secondary | ICD-10-CM

## 2013-09-17 DIAGNOSIS — S301XXS Contusion of abdominal wall, sequela: Secondary | ICD-10-CM

## 2013-09-17 DIAGNOSIS — I2510 Atherosclerotic heart disease of native coronary artery without angina pectoris: Secondary | ICD-10-CM | POA: Diagnosis not present

## 2013-09-17 DIAGNOSIS — IMO0002 Reserved for concepts with insufficient information to code with codable children: Secondary | ICD-10-CM

## 2013-09-17 DIAGNOSIS — I4891 Unspecified atrial fibrillation: Secondary | ICD-10-CM

## 2013-09-17 DIAGNOSIS — I10 Essential (primary) hypertension: Secondary | ICD-10-CM

## 2013-09-17 DIAGNOSIS — I2589 Other forms of chronic ischemic heart disease: Secondary | ICD-10-CM

## 2013-09-17 DIAGNOSIS — R001 Bradycardia, unspecified: Secondary | ICD-10-CM

## 2013-09-17 DIAGNOSIS — I209 Angina pectoris, unspecified: Secondary | ICD-10-CM

## 2013-09-17 DIAGNOSIS — I480 Paroxysmal atrial fibrillation: Secondary | ICD-10-CM

## 2013-09-17 DIAGNOSIS — I498 Other specified cardiac arrhythmias: Secondary | ICD-10-CM

## 2013-09-17 DIAGNOSIS — I255 Ischemic cardiomyopathy: Secondary | ICD-10-CM

## 2013-09-17 LAB — CBC WITH DIFFERENTIAL/PLATELET
BASOS ABS: 0 10*3/uL (ref 0.0–0.1)
Basophils Relative: 0.4 % (ref 0.0–3.0)
Eosinophils Absolute: 0.3 10*3/uL (ref 0.0–0.7)
Eosinophils Relative: 4.3 % (ref 0.0–5.0)
HEMATOCRIT: 35.7 % — AB (ref 39.0–52.0)
Hemoglobin: 12.1 g/dL — ABNORMAL LOW (ref 13.0–17.0)
LYMPHS ABS: 1.1 10*3/uL (ref 0.7–4.0)
Lymphocytes Relative: 17.1 % (ref 12.0–46.0)
MCHC: 33.9 g/dL (ref 30.0–36.0)
MCV: 94.3 fl (ref 78.0–100.0)
Monocytes Absolute: 0.8 10*3/uL (ref 0.1–1.0)
Monocytes Relative: 12.7 % — ABNORMAL HIGH (ref 3.0–12.0)
NEUTROS ABS: 4.3 10*3/uL (ref 1.4–7.7)
Neutrophils Relative %: 65.5 % (ref 43.0–77.0)
Platelets: 225 10*3/uL (ref 150.0–400.0)
RBC: 3.78 Mil/uL — ABNORMAL LOW (ref 4.22–5.81)
RDW: 15.9 % — AB (ref 11.5–15.5)
WBC: 6.6 10*3/uL (ref 4.0–10.5)

## 2013-09-17 NOTE — Progress Notes (Signed)
Cardiology Office Note    Date:  09/17/2013   ID:  [name], DOB 12-23-1926, MRN [id number]  PCP:  [name], MD  Cardiologist:  Dr. Casandra Doffing      History of Present Illness: [name] is a 80 y.o. male with a hx of CAD s/p prior PCI of LAD in 2010, paroxysmal AFib maintaining NSR on Amiodarone, prior GI bleed (no longer on coumadin), GERD, DJD. He was recently seen by Dr. Casandra Doffing for worsening dyspnea and episodes of chest pain. Follow up echo demonstrated worsening LVF with EF 30-35%. He was therefore set up for cardiac catheterization that demonstrated significant calcific proximal LAD disease. It was felt the patient would need PCI of his LAD. He had difficulty from the R radial access and developed a R forearm hematoma post cath with significant pain. He was seen by Dr. Kellie Simmering with Vascular Surgery.  Hematoma stabilized after compression.  He was admitted for PCI 7/14-7/16.  He underwent rotational atherectomy to the proximal LAD followed by stent placement with a Promus DES. Indefinite dual antiplatelet therapy was recommended.  Post PCI course was complicated by bradycardic arrest requiring brief CPR.  EP saw the patient and felt bradycardic arrest was related to autonomic dysfunction. Beta blockers were to be avoided. He may require biventricular pacemaker implantation in the future if his EF does not improve.  He had an episode of PAF with RVR.  He was seen by EP again and placed back on low dose amiodarone (previously held for bradycardia).  I saw him 2 days ago with R groin pain.  He had some swelling that was suspicious for a small hematoma vs scar tissue from a prior surgery.  [place] was completely neg for pseudoaneurysm or AV fistula.  There was no mention of hematoma.  He had a small drop in his Hgb and he was asked to return today for a repeat CBC to assess for stability.  He told the RN he was having chest pain and was added on to my schedule.  He  states his chest is sore when he changes positions.  He denies exertional symptoms.  He has been walking at home.  Denies exertional dyspnea.  He denies fever.  He notes a NP cough.  This is mild. He denies syncope, near syncope, dizziness, lightheadedness.  He feels weak.  His R groin is still sore.  He denies orthopnea, PND, edema.   Echo 08/31/13:  EF 30% - 35%. There is hypokinesis of the anteroseptal myocardium.  Grade 1 diastolic dysfunction. Mitral valve: There was mild regurgitation directed eccentrically. Left atrium: The atrium was mildly dilated.   Recent Labs: 09/03/2013: Pro B Natriuretic peptide (BNP) 39.0  09/09/2013: Creatinine 1.21; Potassium 4.4  09/15/2013: Hemoglobin 11.8*   Wt Readings from Last 3 Encounters:  09/15/13 184 lb (83.462 kg)  09/09/13 184 lb 15.5 oz (83.9 kg)  09/09/13 184 lb 15.5 oz (83.9 kg)     Past Medical History  Diagnosis Date  . Myocardial infarction 1980's- medical intervention  . CAD (coronary artery disease)     a. s/p MI and prior PCI of LAD;  b. LHC (08/2013):  prox LAD 60-70%, mid LAD stents ok, ostial lesion at Dx jailed by stent, mild CFX and RCA disesase >>> PCI of LAD planned   . PAF (paroxysmal atrial fibrillation)     controlled w/ amiodarone; not on coumadin due to hx of GI bleed  . Sleep apnea  non-compliant cpap  . Mitral regurgitation   . GERD (gastroesophageal reflux disease)     occ. take prevacid  . Meningioma right -sided w/ right VI palsy    followed by dr Gaynell Face  . Diverticulitis of colon with bleeding     s/p sigmoid resection '88  . History of GI diverticular bleed april 2012    transfused blood and resolved without surgical intervention  . Impotence, organic     s/p penile prosthesis 1990's  . DJD (degenerative joint disease) hips and knees    s/p bilateral total replacements  . DDD (degenerative disc disease)     chronic back pain  . BPH (benign prostatic hypertrophy) with urinary obstruction     s/p turp  yrs ago  . Blood transfusion   . Impaired hearing bilateral     only left hearing aid  . Bradycardia     Amio d/c'd 08/2013  . Cardiomyopathy     a. Echo (08/2013):  EF 30-35%, AS hypokinesis, Gr 1 diast dysfn, mild MR, mild LAE    Current Outpatient Prescriptions  Medication Sig Dispense Refill  . amiodarone (PACERONE) 100 MG tablet Take 1 tablet (100 mg total) by mouth daily.  30 tablet  6  . aspirin 81 MG chewable tablet Chew 1 tablet (81 mg total) by mouth daily.      . clopidogrel (PLAVIX) 75 MG tablet Take 75 mg by mouth daily.      . diazepam (VALIUM) 5 MG tablet Take 5 mg by mouth at bedtime as needed for anxiety.      . famotidine (PEPCID) 10 MG tablet Take 10 mg by mouth daily.      [name] losartan (COZAAR) 50 MG tablet Take 25 mg by mouth daily. 1/2 tablet daily or as directed      . Multiple Vitamin (MULTIVITAMIN) tablet Take 1 tablet by mouth daily.        . nitroGLYCERIN (NITROSTAT) 0.4 MG SL tablet Place 1 tablet (0.4 mg total) under the tongue every 5 (five) minutes as needed for chest pain.  20 tablet  0  . Omega-3 Fatty Acids (FISH OIL) 1000 MG CAPS Take 1 capsule by mouth daily.      [name] OVER THE COUNTER MEDICATION Take 1 tablet by mouth 2 (two) times daily. Vitamins for eyes      . OVER THE COUNTER MEDICATION Take 2 tablets by mouth daily. [facility] brand of Prostrate health      . psyllium (METAMUCIL) 58.6 % packet Take 1 packet by mouth daily as needed (for constipation).      . sodium chloride (OCEAN) 0.65 % nasal spray Place 1 spray into the nose as needed for congestion.       No current facility-administered medications for this visit.    Allergies:   Codeine and Warfarin sodium   Social History:  The patient  reports that he quit smoking about 52 years ago. He has never used smokeless tobacco. He reports that he does not drink alcohol or use illicit drugs.   Family History:  The patient's family history includes Cancer in an other family member; Heart attack in his  mother; Heart disease in his mother.   ROS:  Please see the history of present illness.   No bleeding.  No vomiting, diarrhea.    All other systems reviewed and negative.   PHYSICAL EXAM: VS:  BP 137/57  Pulse 52  Ht 5\' 7"  (1.702 m)  Wt 180 lb (81.647 kg)  BMI 28.19 kg/m2 Well nourished, well developed, in no acute distress HEENT: normal Neck:  no JVD Cardiac:  normal S1, S2;  RRR; no murmur Chest:  No tenderness to palpation Lungs:   clear to auscultation bilaterally, no wheezing, rhonchi or rales Abd: soft, nontender, no hepatomegaly Ext:  no edemaright groin with large amount of ecchymosis noted, moderate sized linear mass without bruit  Skin: warm and dry Neuro:  CNs 2-12 intact, no focal abnormalities noted   EKG:  Sinus brady, HR 52, 1st degree AVB, LBBB      ASSESSMENT AND PLAN:  1. Groin hematoma, sequela:  Recent US neg for pseudoaneurysm.  CBC repeated today.  I reviewed images with tech.  [place] was completely normal.  2. Chest Pain:  Atypical.  He denies any recurrent angina.  Pain is only noted with certain position changes.  I suspect this is MSK from reported CPR in the hospital at time of bradycardic arrest.  Reassurance.  3. Atherosclerosis of native coronary artery of native heart without angina pectoris:  As noted, no angina.  Continue ASA, Plavix.   4. Paroxysmal atrial fibrillation:  Maintaining NSR. He is not felt to be a candidate for anticoagulation.  He is on low-dose amiodarone. 5. Bradycardia:  He may be a candidate for biventricular pacing if EF does not recover.  No symptoms of near syncope, dizziness.  If symptoms occur, will get Holter.  If he has syncope, I have instructed him to go to the ED.  6. Ischemic cardiomyopathy:   Given bradycardia, he cannot take a blocker. He remains on ARB. Echo will be repeated at some point in the near future to assess for recovery of LV function after PCI. 7. Essential hypertension, benign:  Controlled.  8. Disposition:   Keep f/u with me next week.    Signed, Versie Starks, MHS 09/17/2013 10:09 AM    [facility] [place], [place], [place]  [address] Phone: [phone]; Fax: [phone]

## 2013-09-17 NOTE — Patient Instructions (Signed)
KEEP YOUR FOLLOW UP NEXT WEEK WITH [address] [place]

## 2013-09-18 ENCOUNTER — Telehealth: Payer: Self-pay | Admitting: *Deleted

## 2013-09-18 NOTE — Telephone Encounter (Signed)
pt notified about lab results with verbal understanding to results today

## 2013-09-21 ENCOUNTER — Ambulatory Visit (INDEPENDENT_AMBULATORY_CARE_PROVIDER_SITE_OTHER): Payer: Medicare Other | Admitting: Physician Assistant

## 2013-09-21 ENCOUNTER — Encounter: Payer: Self-pay | Admitting: Physician Assistant

## 2013-09-21 VITALS — BP 148/72 | HR 48 | Ht 67.0 in | Wt 183.0 lb

## 2013-09-21 DIAGNOSIS — I4891 Unspecified atrial fibrillation: Secondary | ICD-10-CM | POA: Diagnosis not present

## 2013-09-21 DIAGNOSIS — I209 Angina pectoris, unspecified: Secondary | ICD-10-CM

## 2013-09-21 DIAGNOSIS — I2589 Other forms of chronic ischemic heart disease: Secondary | ICD-10-CM

## 2013-09-21 DIAGNOSIS — I498 Other specified cardiac arrhythmias: Secondary | ICD-10-CM | POA: Diagnosis not present

## 2013-09-21 DIAGNOSIS — I25118 Atherosclerotic heart disease of native coronary artery with other forms of angina pectoris: Secondary | ICD-10-CM

## 2013-09-21 DIAGNOSIS — I480 Paroxysmal atrial fibrillation: Secondary | ICD-10-CM

## 2013-09-21 DIAGNOSIS — I10 Essential (primary) hypertension: Secondary | ICD-10-CM

## 2013-09-21 DIAGNOSIS — I255 Ischemic cardiomyopathy: Secondary | ICD-10-CM

## 2013-09-21 DIAGNOSIS — I2510 Atherosclerotic heart disease of native coronary artery without angina pectoris: Secondary | ICD-10-CM

## 2013-09-21 DIAGNOSIS — R001 Bradycardia, unspecified: Secondary | ICD-10-CM

## 2013-09-21 MED ORDER — NITROGLYCERIN 0.4 MG SL SUBL: 0.4000 mg | SUBLINGUAL_TABLET | SUBLINGUAL | Status: DC | PRN

## 2013-09-21 NOTE — Patient Instructions (Addendum)
Your physician recommends that you continue on your current medications as directed. Please refer to the Current Medication list given to you today.  Your physician has requested that you have an echocardiogram. Echocardiography is a painless test that uses sound waves to create images of your heart. It provides your doctor with information about the size and shape of your heart and how well your heart's chambers and valves are working. This procedure takes approximately one hour. There are no restrictions for this procedure. ( Scheduled after 12/09/2013)  Your physician has recommended that you wear a holter monitor. Holter monitors are medical devices that record the heart's electrical activity. Doctors most often use these monitors to diagnose arrhythmias. Arrhythmias are problems with the speed or rhythm of the heartbeat. The monitor is a small, portable device. You can wear one while you do your normal daily activities. This is usually used to diagnose what is causing palpitations/syncope (passing out).  Your physician recommends that you schedule a follow-up appointment with [name]  Your physician recommends that you schedule a follow-up appointment in: 6-8 weeks with Dr Irish Lack

## 2013-09-21 NOTE — Progress Notes (Signed)
Cardiology Office Note    Date:  09/21/2013   ID:  [name], DOB 1926/06/14, MRN [id number]  PCP:  [name], MD  Cardiologist:  Dr. Casandra Doffing      History of Present Illness: [name] is a 80 y.o. male with a hx of CAD s/p prior PCI of LAD in 2010, paroxysmal AFib maintaining NSR on Amiodarone, prior GI bleed (no longer on coumadin), GERD, DJD. He was recently seen by Dr. Casandra Doffing for worsening dyspnea and episodes of chest pain. Follow up echo demonstrated worsening LVF with EF 30-35%. He was therefore set up for cardiac catheterization that demonstrated significant calcific proximal LAD disease. It was felt the patient would need PCI of his LAD. He had difficulty from the R radial access and developed a R forearm hematoma post cath with significant pain. He was seen by Dr. Kellie Simmering with Vascular Surgery.  Hematoma stabilized after compression.  Admitted for PCI 7/14-7/16 (rotational atherectomy and Promus DES to the proximal LAD). Indefinite dual antiplatelet therapy was recommended.  Post PCI course was c/b bradycardic arrest requiring brief CPR.  EP saw the patient and felt bradycardic arrest was related to autonomic dysfunction. Beta blockers were to be avoided. He may require biventricular pacemaker implantation in the future if his EF does not improve.  He had an episode of PAF with RVR.  He was seen by EP again and placed back on low dose amiodarone (previously held for bradycardia).  I saw him x 2 last week.  He came in with R groin pain and swelling.  [place] was neg for pseudoaneurysm.  CBC was repeated x 2 with stable Hgb.  He had some chest soreness and reassurance was provided.  He returns for follow up that was previously scheduled (post hospital).    He continues to feel poorly.  He notes DOE that is unchanged since prior to his PCI.  He notes chest soreness.  Denies syncope, orthopnea, PND, LE edema.  He is not really increasing his activity. He has not  started cardiac rehab yet.     Studies:  - Echo 08/31/13:  EF 30% - 35%. There is hypokinesis of the anteroseptal myocardium.  Grade 1 diastolic dysfunction. Mitral valve: There was mild regurgitation directed eccentrically. Left atrium: The atrium was mildly dilated.   Recent Labs: 09/03/2013: Pro B Natriuretic peptide (BNP) 39.0  09/09/2013: Creatinine 1.21; Potassium 4.4  09/17/2013: Hemoglobin 12.1*   Wt Readings from Last 3 Encounters:  09/21/13 183 lb (83.008 kg)  09/17/13 180 lb (81.647 kg)  09/15/13 184 lb (83.462 kg)     Past Medical History  Diagnosis Date  . Myocardial infarction 1980's- medical intervention  . PAF (paroxysmal atrial fibrillation)     controlled w/ amiodarone; not on coumadin due to hx of GI bleed  . Sleep apnea     non-compliant cpap  . Mitral regurgitation   . GERD (gastroesophageal reflux disease)     occ. take prevacid  . Meningioma right -sided w/ right VI palsy    followed by dr Gaynell Face  . Diverticulitis of colon with bleeding     s/p sigmoid resection '88  . History of GI diverticular bleed april 2012    transfused blood and resolved without surgical intervention  . Impotence, organic     s/p penile prosthesis 1990's  . DJD (degenerative joint disease) hips and knees    s/p bilateral total replacements  . DDD (degenerative disc disease)  chronic back pain  . BPH (benign prostatic hypertrophy) with urinary obstruction     s/p turp yrs ago  . Blood transfusion   . Impaired hearing bilateral     only left hearing aid  . Bradycardia     Amio d/c'd 08/2013; brady arrest 08/2013 after PCI >>> recurrent AF >>> Amiodarone restarted (may need BiV pacer if EF does not recover; consider Holter if + sx's)  . Cardiomyopathy     a. Echo (08/2013):  EF 30-35%, AS hypokinesis, Gr 1 diast dysfn, mild MR, mild LAE  . CAD (coronary artery disease)     a. s/p MI and prior PCI of LAD;  b. LHC (08/2013):  prox LAD 60-70%, mid LAD stents ok, ostial lesion at  Dx jailed by stent, mild CFX and RCA disesase >>>  PCI (09/08/13):  rotational atherectomy + Promus DES to prox LAD    Current Outpatient Prescriptions  Medication Sig Dispense Refill  . amiodarone (PACERONE) 100 MG tablet Take 1 tablet (100 mg total) by mouth daily.  30 tablet  6  . clopidogrel (PLAVIX) 75 MG tablet Take 75 mg by mouth daily.      . diazepam (VALIUM) 5 MG tablet Take 5 mg by mouth at bedtime as needed for anxiety.      . famotidine (PEPCID) 10 MG tablet Take 10 mg by mouth daily.      [name] losartan (COZAAR) 50 MG tablet Take 25 mg by mouth daily. 1/2 tablet daily or as directed      . Multiple Vitamin (MULTIVITAMIN) tablet Take 1 tablet by mouth daily.        . nitroGLYCERIN (NITROSTAT) 0.4 MG SL tablet Place 1 tablet (0.4 mg total) under the tongue every 5 (five) minutes as needed for chest pain.  20 tablet  0  . Omega-3 Fatty Acids (FISH OIL) 1000 MG CAPS Take 1 capsule by mouth daily.      [name] OVER THE COUNTER MEDICATION Take 1 tablet by mouth 2 (two) times daily. Vitamins for eyes      . OVER THE COUNTER MEDICATION Take 2 tablets by mouth daily. [facility] brand of Prostrate health      . psyllium (METAMUCIL) 58.6 % packet Take 1 packet by mouth daily as needed (for constipation).      . sodium chloride (OCEAN) 0.65 % nasal spray Place 1 spray into the nose as needed for congestion.       No current facility-administered medications for this visit.    Allergies:   Codeine and Warfarin sodium   Social History:  The patient  reports that he quit smoking about 52 years ago. He has never used smokeless tobacco. He reports that he does not drink alcohol or use illicit drugs.   Family History:  The patient's family history includes Cancer in an other family member; Heart attack in his mother; Heart disease in his mother.   ROS:  Please see the history of present illness.    All other systems reviewed and negative.   PHYSICAL EXAM: VS:  BP 148/72  Pulse 48  Ht 5\' 7"  (1.702 m)   Wt 183 lb (83.008 kg)  BMI 28.66 kg/m2 Well nourished, well developed, in no acute distress HEENT: normal Neck:  no JVD Cardiac:  normal S1, S2;  RRR; no murmur Lungs:   clear to auscultation bilaterally, no wheezing, rhonchi or rales Abd: soft, nontender, no hepatomegaly Ext:  no edemaright groin with moderate sized linear mass non-tender Skin: warm  and dry Neuro:  CNs 2-12 intact, no focal abnormalities noted   EKG:  Sinus brady, HR 51, LBBB      ASSESSMENT AND PLAN:  1. Groin hematoma, sequela:  Recent US neg for pseudoaneurysm.  Overall I think this is more likely SQ fat from prior hernia surgery and less likely a hematoma.  Reassurance.  2. Atherosclerosis of native coronary artery of native heart without angina pectoris:  He continues to feel short of breath.  No evidence of volume excess on exam.  I think he is deconditioned from all he has been through.  I have encouraged him to start Cardiac Rehab.  Continue ASA, Plavix.   3. Paroxysmal atrial fibrillation:  Maintaining NSR. He is not felt to be a candidate for anticoagulation.  He is on low-dose amiodarone. 4. Bradycardia:  It is not entirely clear that he is symptomatic at this time.  He may be a candidate for biventricular pacing if EF does not recover.  Pacemaker with closed loop stimulation was also mentioned in the hospital if he requires PPM in setting of normal LVF.  Reviewed with Dr. Casandra Doffing today.  Will go ahead and get a 48 hr Holter and make sure he has f/u with EF (Dr. Cristopher Peru). 5. Ischemic cardiomyopathy:   Given bradycardia, he cannot take a blocker. He remains on ARB. Volume appears stable without diuretics.  Arrange follow up echo in 11/2013 to assess for recovery of LV function after PCI. 6. Essential hypertension, benign:  Controlled.  7. Disposition:  F/u with Dr. Casandra Doffing in 6-8 weeks.     Signed, Versie Starks, MHS 09/21/2013 3:49 PM    [facility] [place], [place], [place]  [address] Phone: [phone]; Fax: [phone]

## 2013-09-23 ENCOUNTER — Encounter: Payer: Self-pay | Admitting: *Deleted

## 2013-09-23 ENCOUNTER — Telehealth (HOSPITAL_COMMUNITY): Payer: Self-pay

## 2013-09-23 ENCOUNTER — Encounter (INDEPENDENT_AMBULATORY_CARE_PROVIDER_SITE_OTHER): Payer: Medicare Other

## 2013-09-23 DIAGNOSIS — R001 Bradycardia, unspecified: Secondary | ICD-10-CM

## 2013-09-23 DIAGNOSIS — I498 Other specified cardiac arrhythmias: Secondary | ICD-10-CM

## 2013-09-23 NOTE — Progress Notes (Signed)
Patient ID: [name], male   DOB: 08/04/1926, 80 y.o.   MRN: [id number] E-Cardio 48 hour holter monitor applied to patient.

## 2013-09-30 ENCOUNTER — Encounter (INDEPENDENT_AMBULATORY_CARE_PROVIDER_SITE_OTHER): Payer: Medicare Other | Admitting: Ophthalmology

## 2013-09-30 DIAGNOSIS — I10 Essential (primary) hypertension: Secondary | ICD-10-CM

## 2013-09-30 DIAGNOSIS — H3530 Unspecified macular degeneration: Secondary | ICD-10-CM | POA: Diagnosis not present

## 2013-09-30 DIAGNOSIS — H348392 Tributary (branch) retinal vein occlusion, unspecified eye, stable: Secondary | ICD-10-CM | POA: Diagnosis not present

## 2013-09-30 DIAGNOSIS — H35039 Hypertensive retinopathy, unspecified eye: Secondary | ICD-10-CM | POA: Diagnosis not present

## 2013-09-30 DIAGNOSIS — H43819 Vitreous degeneration, unspecified eye: Secondary | ICD-10-CM

## 2013-10-01 ENCOUNTER — Encounter (HOSPITAL_COMMUNITY)
Admission: RE | Admit: 2013-10-01 | Discharge: 2013-10-01 | Disposition: A | Discharge status: Home / Self Care | Payer: Medicare Other | Source: Ambulatory Visit | Attending: Interventional Cardiology | Admitting: Interventional Cardiology

## 2013-10-01 ENCOUNTER — Telehealth: Payer: Self-pay | Admitting: Cardiology

## 2013-10-01 DIAGNOSIS — Z9861 Coronary angioplasty status: Secondary | ICD-10-CM | POA: Insufficient documentation

## 2013-10-01 DIAGNOSIS — I252 Old myocardial infarction: Secondary | ICD-10-CM | POA: Insufficient documentation

## 2013-10-01 DIAGNOSIS — Z5189 Encounter for other specified aftercare: Secondary | ICD-10-CM | POA: Insufficient documentation

## 2013-10-01 DIAGNOSIS — I2510 Atherosclerotic heart disease of native coronary artery without angina pectoris: Secondary | ICD-10-CM | POA: Insufficient documentation

## 2013-10-01 NOTE — Telephone Encounter (Signed)
Dr. Hassell Done Interpretation of 48 hr holter monitor: No Afib. Sinus Loletha Grayer.

## 2013-10-01 NOTE — Progress Notes (Signed)
Cardiac Rehab Medication Review by a Pharmacist  Does the patient  feel that his/her medications are working for him/her?  yes  Has the patient been experiencing any side effects to the medications prescribed?  yes  Does the patient measure his/her own blood pressure or blood glucose at home?  yes   Does the patient have any problems obtaining medications due to transportation or finances?   yes  Understanding of regimen: excellent Understanding of indications: excellent Potential of compliance: excellent  Pharmacist comments: [name] is a 76 yom with excellent knowledge of his medication regimen.  He takes several over the counter medications including saw palmetto. I addressed all his questions regarding over the counter medications.  He is experiencing episodes of constipation, loss of appetite, insomnia, cough.  [name], Pharm. D. Clinical Pharmacy Resident Pager: [phone] Ph: [phone] 10/01/2013 9:31 AM

## 2013-10-02 NOTE — Telephone Encounter (Signed)
Pt.notified

## 2013-10-05 ENCOUNTER — Encounter (HOSPITAL_COMMUNITY)
Admission: RE | Admit: 2013-10-05 | Discharge: 2013-10-05 | Disposition: A | Discharge status: Home / Self Care | Payer: Medicare Other | Source: Ambulatory Visit | Attending: Interventional Cardiology | Admitting: Interventional Cardiology

## 2013-10-05 DIAGNOSIS — Z5189 Encounter for other specified aftercare: Secondary | ICD-10-CM | POA: Diagnosis not present

## 2013-10-05 DIAGNOSIS — I252 Old myocardial infarction: Secondary | ICD-10-CM | POA: Diagnosis not present

## 2013-10-05 DIAGNOSIS — I2510 Atherosclerotic heart disease of native coronary artery without angina pectoris: Secondary | ICD-10-CM | POA: Diagnosis not present

## 2013-10-05 DIAGNOSIS — Z9861 Coronary angioplasty status: Secondary | ICD-10-CM | POA: Diagnosis not present

## 2013-10-05 NOTE — Progress Notes (Signed)
Pt started cardiac rehab today.  Pt tolerated light exercise without difficulty. Telemetry rhythm Sinus rhythm with a first degree heart block. Vital sign stable. [name] uses a rolling walker for stability. [name] has a home health aide who will be bringing him to cardiac rehab.

## 2013-10-07 ENCOUNTER — Encounter (HOSPITAL_COMMUNITY)
Admission: RE | Admit: 2013-10-07 | Discharge: 2013-10-07 | Disposition: A | Discharge status: Home / Self Care | Payer: Medicare Other | Source: Ambulatory Visit | Attending: Interventional Cardiology | Admitting: Interventional Cardiology

## 2013-10-07 DIAGNOSIS — I252 Old myocardial infarction: Secondary | ICD-10-CM | POA: Diagnosis not present

## 2013-10-07 DIAGNOSIS — I2510 Atherosclerotic heart disease of native coronary artery without angina pectoris: Secondary | ICD-10-CM | POA: Diagnosis not present

## 2013-10-07 DIAGNOSIS — Z9861 Coronary angioplasty status: Secondary | ICD-10-CM | POA: Diagnosis not present

## 2013-10-07 DIAGNOSIS — Z5189 Encounter for other specified aftercare: Secondary | ICD-10-CM | POA: Diagnosis not present

## 2013-10-09 ENCOUNTER — Encounter (HOSPITAL_COMMUNITY)
Admission: RE | Admit: 2013-10-09 | Discharge: 2013-10-09 | Disposition: A | Discharge status: Home / Self Care | Payer: Medicare Other | Source: Ambulatory Visit | Attending: Interventional Cardiology | Admitting: Interventional Cardiology

## 2013-10-09 DIAGNOSIS — Z5189 Encounter for other specified aftercare: Secondary | ICD-10-CM | POA: Diagnosis not present

## 2013-10-09 DIAGNOSIS — I252 Old myocardial infarction: Secondary | ICD-10-CM | POA: Diagnosis not present

## 2013-10-09 DIAGNOSIS — Z9861 Coronary angioplasty status: Secondary | ICD-10-CM | POA: Diagnosis not present

## 2013-10-09 DIAGNOSIS — I2510 Atherosclerotic heart disease of native coronary artery without angina pectoris: Secondary | ICD-10-CM | POA: Diagnosis not present

## 2013-10-12 ENCOUNTER — Ambulatory Visit (INDEPENDENT_AMBULATORY_CARE_PROVIDER_SITE_OTHER): Payer: Medicare Other | Admitting: Internal Medicine

## 2013-10-12 ENCOUNTER — Encounter: Payer: Self-pay | Admitting: Internal Medicine

## 2013-10-12 ENCOUNTER — Encounter (HOSPITAL_COMMUNITY): Payer: Medicare Other

## 2013-10-12 VITALS — Ht 67.0 in | Wt 186.0 lb

## 2013-10-12 DIAGNOSIS — I447 Left bundle-branch block, unspecified: Secondary | ICD-10-CM | POA: Insufficient documentation

## 2013-10-12 DIAGNOSIS — I10 Essential (primary) hypertension: Secondary | ICD-10-CM | POA: Diagnosis not present

## 2013-10-12 DIAGNOSIS — R001 Bradycardia, unspecified: Secondary | ICD-10-CM

## 2013-10-12 DIAGNOSIS — I209 Angina pectoris, unspecified: Secondary | ICD-10-CM

## 2013-10-12 DIAGNOSIS — I4891 Unspecified atrial fibrillation: Secondary | ICD-10-CM

## 2013-10-12 DIAGNOSIS — I2510 Atherosclerotic heart disease of native coronary artery without angina pectoris: Secondary | ICD-10-CM

## 2013-10-12 DIAGNOSIS — I498 Other specified cardiac arrhythmias: Secondary | ICD-10-CM | POA: Diagnosis not present

## 2013-10-12 DIAGNOSIS — I5022 Chronic systolic (congestive) heart failure: Secondary | ICD-10-CM

## 2013-10-12 DIAGNOSIS — I5042 Chronic combined systolic (congestive) and diastolic (congestive) heart failure: Secondary | ICD-10-CM | POA: Insufficient documentation

## 2013-10-12 NOTE — Assessment & Plan Note (Signed)
We will plan to restart his beta blocker once he has undergone pacemaker insertion. Hopefully, we will be able to up titrate his amiodarone as well.

## 2013-10-12 NOTE — Progress Notes (Signed)
HPI Mr. Curl returns today for followup. I saw him in the hospital several weeks ago. At that time he developed bradycardia associated with nausea. He undergone a very complex percutaneous intervention. His ejection fraction at that time was 30%. Our hope was that his ejection fraction will improve following revascularization. He has been stable in the interim, with class II symptoms. He denies angina. No peripheral edema. He has shortness of breath with exertion. No syncope. Allergies  Allergen Reactions  . Codeine Other (See Comments)    Insomnia/ hyper  . Warfarin Sodium Other (See Comments)    Hx gi bleed     Current Outpatient Prescriptions  Medication Sig Dispense Refill  . amiodarone (PACERONE) 100 MG tablet Take 1 tablet (100 mg total) by mouth daily.  30 tablet  6  . clopidogrel (PLAVIX) 75 MG tablet Take 75 mg by mouth daily.      . diazepam (VALIUM) 5 MG tablet Take 5 mg by mouth at bedtime as needed for anxiety.      [name] esomeprazole (NEXIUM) 20 MG capsule Take 20 mg by mouth daily.      [name] losartan (COZAAR) 50 MG tablet Take 25 mg by mouth daily. 1/2 tablet daily or as directed      . Multiple Vitamin (MULTIVITAMIN) tablet Take 1 tablet by mouth daily.        . nitroGLYCERIN (NITROSTAT) 0.4 MG SL tablet Place 1 tablet (0.4 mg total) under the tongue every 5 (five) minutes as needed for chest pain.  20 tablet  0  . OVER THE COUNTER MEDICATION Take 1 tablet by mouth 2 (two) times daily. Vitamins for eyes      . psyllium (METAMUCIL) 58.6 % packet Take 1 packet by mouth daily as needed (for constipation).      . Saw Palmetto, Serenoa repens, (SAW PALMETTO PO) Take by mouth.      . sodium chloride (OCEAN) 0.65 % nasal spray Place 1 spray into the nose as needed for congestion.      . Omega-3 Fatty Acids (FISH OIL) 1000 MG CAPS Take 1 capsule by mouth daily.      [name] OVER THE COUNTER MEDICATION Take 2 tablets by mouth daily. [facility] brand of Prostrate health       No  current facility-administered medications for this visit.     Past Medical History  Diagnosis Date  . Myocardial infarction 1980's- medical intervention  . PAF (paroxysmal atrial fibrillation)     controlled w/ amiodarone; not on coumadin due to hx of GI bleed  . Sleep apnea     non-compliant cpap  . Mitral regurgitation   . GERD (gastroesophageal reflux disease)     occ. take prevacid  . Meningioma right -sided w/ right VI palsy    followed by dr Gaynell Face  . Diverticulitis of colon with bleeding     s/p sigmoid resection '88  . History of GI diverticular bleed april 2012    transfused blood and resolved without surgical intervention  . Impotence, organic     s/p penile prosthesis 1990's  . DJD (degenerative joint disease) hips and knees    s/p bilateral total replacements  . DDD (degenerative disc disease)     chronic back pain  . BPH (benign prostatic hypertrophy) with urinary obstruction     s/p turp yrs ago  . Blood transfusion   . Impaired hearing bilateral     only left hearing aid  . Bradycardia  Amio d/c'd 08/2013; brady arrest 08/2013 after PCI >>> recurrent AF >>> Amiodarone restarted (may need BiV pacer if EF does not recover; consider Holter if + sx's)  . Cardiomyopathy     a. Echo (08/2013):  EF 30-35%, AS hypokinesis, Gr 1 diast dysfn, mild MR, mild LAE  . CAD (coronary artery disease)     a. s/p MI and prior PCI of LAD;  b. LHC (08/2013):  prox LAD 60-70%, mid LAD stents ok, ostial lesion at Dx jailed by stent, mild CFX and RCA disesase >>>  PCI (09/08/13):  rotational atherectomy + Promus DES to prox LAD  . Mitral valve disorders     ROS:   All systems reviewed and negative except as noted in the HPI.   Past Surgical History  Procedure Laterality Date  . Cholecystectomy    . Joint replacement  03-25-08--total right hip     s/ revision's x2 and sev. closed reduction for dislocation last one 06-20-09  . Joint replacement  2005    total left hip  . Joint  replacement  2004    total right knee and revision  . Joint replacement  1997    total left knee  . Hernia repair      bilateral inguinal hernia repair  . Cataract extraction w/ intraocular lens  implant, bilateral    . Appendectomy    . Transurethral resection of prostate  yrs ago  . Penile prosthesis implant  1990's  . Rotator cuff repair  left  . Right ear surgery  yrs ago  . Cardiac catheterization  2007    noncritical cad (results w/ chart)  . Coronary angioplasty with stent placement  08-03-08    drug-eluting stent x2 distal and mid lad  . Transurethral resection of prostate  01/08/2011    Procedure: TRANSURETHRAL RESECTION OF THE PROSTATE (TURP);  Surgeon: Franchot Gallo;  Location: [facility];  Service: Urology;  Laterality: N/A;  GYRUS OWER     Family History  Problem Relation Age of Onset  . Heart disease Mother   . Cancer    . Heart attack Mother      History   Social History  . Marital Status: Single    Spouse Name: N/A    Number of Children: N/A  . Years of Education: N/A   Occupational History  . Not on file.   Social History Main Topics  . Smoking status: Former Smoker    Quit date: 01/04/1961  . Smokeless tobacco: Never Used  . Alcohol Use: No  . Drug Use: No  . Sexual Activity: Not on file   Other Topics Concern  . Not on file   Social History Narrative  . No narrative on file     Ht 5\' 7"  (1.702 m)  Wt 186 lb (84.369 kg)  BMI 29.12 kg/m2  Physical Exam:  Well appearing elderly man, NAD HEENT: Unremarkable Neck:  7 cm JVD, no thyromegally Back:  No CVA tenderness Lungs:  Clear with no wheezes, rales, or rhonchi. HEART:  Regular rate rhythm, no murmurs, no rubs, no clicks Abd:  soft, positive bowel sounds, no organomegally, no rebound, no guarding Ext:  2 plus pulses, no edema, no cyanosis, no clubbing Skin:  No rashes no nodules Neuro:  CN II through XII intact, motor grossly intact  EKG - normal sinus  rhythm with sinus bradycardia and left bundle branch block   Assess/Plan:

## 2013-10-12 NOTE — Patient Instructions (Signed)
Your physician has requested that you have an echocardiogram. Echocardiography is a painless test that uses sound waves to create images of your heart. It provides your doctor with information about the size and shape of your heart and how well your heart's chambers and valves are working. This procedure takes approximately one hour. There are no restrictions for this procedure.  Your physician recommends that you continue on your current medications as directed. Please refer to the Current Medication list given to you today.  Your physician has recommended that you have a pacemaker inserted. A pacemaker is a small device that is placed under the skin of your chest or abdomen to help control abnormal heart rhythms. This device uses electrical pulses to prompt the heart to beat at a normal rate. Pacemakers are used to treat heart rhythms that are too slow. Wire (leads) are attached to the pacemaker that goes into the chambers of you heart. This is done in the hospital and usually requires and overnight stay. Please see the instruction sheet given to you today for more information.

## 2013-10-12 NOTE — Assessment & Plan Note (Signed)
He is status post percutaneous coronary intervention approximately 40 days ago. He will undergo repeat echocardiography. He has no anginal symptoms. He will continue his current medical therapy. Once he has undergone pacemaker insertion, I would hope to restart his beta blocker.

## 2013-10-12 NOTE — Assessment & Plan Note (Signed)
His heart failure symptoms are class II. He has left bundle branch block. He is unable to take a beta blocker because of severe bradycardia. His ejection fraction was 30%. Repeat echo is pending. Based on all the above, I have recommended insertion of a biventricular pacemaker unless his ejection fraction has normalized. The risk, goals, benefits, and expectations of the procedure have been discussed with the patient and he wishes to proceed. If his ejection fraction is about 50%, he would undergo watchful waiting rather than undergo pacemaker insertion.

## 2013-10-14 ENCOUNTER — Encounter (HOSPITAL_COMMUNITY)
Admission: RE | Admit: 2013-10-14 | Discharge: 2013-10-14 | Disposition: A | Discharge status: Home / Self Care | Payer: Medicare Other | Source: Ambulatory Visit | Attending: Interventional Cardiology | Admitting: Interventional Cardiology

## 2013-10-14 DIAGNOSIS — Z9861 Coronary angioplasty status: Secondary | ICD-10-CM | POA: Diagnosis not present

## 2013-10-14 DIAGNOSIS — I2510 Atherosclerotic heart disease of native coronary artery without angina pectoris: Secondary | ICD-10-CM | POA: Diagnosis not present

## 2013-10-14 DIAGNOSIS — Z5189 Encounter for other specified aftercare: Secondary | ICD-10-CM | POA: Diagnosis not present

## 2013-10-14 DIAGNOSIS — I252 Old myocardial infarction: Secondary | ICD-10-CM | POA: Diagnosis not present

## 2013-10-15 ENCOUNTER — Ambulatory Visit (HOSPITAL_COMMUNITY): Payer: Medicare Other | Attending: Cardiovascular Disease

## 2013-10-15 DIAGNOSIS — I2789 Other specified pulmonary heart diseases: Secondary | ICD-10-CM | POA: Diagnosis not present

## 2013-10-15 DIAGNOSIS — I2589 Other forms of chronic ischemic heart disease: Secondary | ICD-10-CM | POA: Insufficient documentation

## 2013-10-15 DIAGNOSIS — I255 Ischemic cardiomyopathy: Secondary | ICD-10-CM

## 2013-10-15 NOTE — Progress Notes (Signed)
Intermittent exertional blood pressure elevations noted on the walking track the past two exercise sessions at cardiac rehab. Will fax exercise flow sheets to Dr. Hassell Done office for review. Resting blood pressures have been within normal limits.

## 2013-10-15 NOTE — Progress Notes (Signed)
2D Echo completed. 10/15/2013 

## 2013-10-16 ENCOUNTER — Encounter (HOSPITAL_COMMUNITY)
Admission: RE | Admit: 2013-10-16 | Discharge: 2013-10-16 | Disposition: A | Discharge status: Home / Self Care | Payer: Medicare Other | Source: Ambulatory Visit | Attending: Interventional Cardiology | Admitting: Interventional Cardiology

## 2013-10-16 DIAGNOSIS — I2510 Atherosclerotic heart disease of native coronary artery without angina pectoris: Secondary | ICD-10-CM | POA: Diagnosis not present

## 2013-10-16 DIAGNOSIS — I252 Old myocardial infarction: Secondary | ICD-10-CM | POA: Diagnosis not present

## 2013-10-16 DIAGNOSIS — Z5189 Encounter for other specified aftercare: Secondary | ICD-10-CM | POA: Diagnosis not present

## 2013-10-16 DIAGNOSIS — Z9861 Coronary angioplasty status: Secondary | ICD-10-CM | POA: Diagnosis not present

## 2013-10-18 ENCOUNTER — Encounter: Payer: Self-pay | Admitting: Physician Assistant

## 2013-10-19 ENCOUNTER — Telehealth: Payer: Self-pay

## 2013-10-19 ENCOUNTER — Encounter (HOSPITAL_COMMUNITY): Payer: Medicare Other

## 2013-10-19 DIAGNOSIS — H4011X0 Primary open-angle glaucoma, stage unspecified: Secondary | ICD-10-CM | POA: Diagnosis not present

## 2013-10-19 DIAGNOSIS — Z961 Presence of intraocular lens: Secondary | ICD-10-CM | POA: Diagnosis not present

## 2013-10-19 DIAGNOSIS — H02839 Dermatochalasis of unspecified eye, unspecified eyelid: Secondary | ICD-10-CM | POA: Diagnosis not present

## 2013-10-19 DIAGNOSIS — H348392 Tributary (branch) retinal vein occlusion, unspecified eye, stable: Secondary | ICD-10-CM | POA: Diagnosis not present

## 2013-10-19 NOTE — Telephone Encounter (Signed)
I called the pt to give him his Echo results. He wants to know, since his EF has improved, if he will still be getting a pacemaker that is scheduled for 11/04/13. The pt is requesting a call back. Note forwarded to [name] and his nurse Claiborne Billings.

## 2013-10-19 NOTE — Telephone Encounter (Signed)
Chronic systolic heart failure - Evans Lance, MD at 10/12/2013 8:58 AM     Status: Written Related Problem: Chronic systolic heart failure    His heart failure symptoms are class II. He has left bundle branch block. He is unable to take a beta blocker because of severe bradycardia. His ejection fraction was 30%. Repeat echo is pending. Based on all the above, I have recommended insertion of a biventricular pacemaker unless his ejection fraction has normalized. The risk, goals, benefits, and expectations of the procedure have been discussed with the patient and he wishes to proceed. If his ejection fraction is about 50%, he would undergo watchful waiting rather than undergo pacemaker insertion.

## 2013-10-21 ENCOUNTER — Encounter: Payer: Self-pay | Admitting: Internal Medicine

## 2013-10-21 ENCOUNTER — Encounter (HOSPITAL_COMMUNITY): Payer: Medicare Other

## 2013-10-21 NOTE — Telephone Encounter (Signed)
This encounter was created in error - please disregard.

## 2013-10-21 NOTE — Telephone Encounter (Signed)
New message     Returning a nurses call regarding a whether pt needs a pacemaker.

## 2013-10-21 NOTE — Telephone Encounter (Signed)
Patient is aware of his echo results and the Bi-V PPM has been canceled for 11/04/13

## 2013-10-23 ENCOUNTER — Telehealth (HOSPITAL_COMMUNITY): Payer: Self-pay | Admitting: *Deleted

## 2013-10-23 ENCOUNTER — Encounter (HOSPITAL_COMMUNITY): Admission: RE | Admit: 2013-10-23 | Payer: Medicare Other | Source: Ambulatory Visit

## 2013-10-26 ENCOUNTER — Encounter (HOSPITAL_COMMUNITY): Payer: Medicare Other

## 2013-10-26 ENCOUNTER — Telehealth: Payer: Self-pay | Admitting: Interventional Cardiology

## 2013-10-26 NOTE — Telephone Encounter (Signed)
New message     Pt had chest pain around 8:30am----took a nitro--right now--no chest pain-no answer in triage--msg sent to triage

## 2013-10-26 NOTE — Telephone Encounter (Signed)
lmtrc

## 2013-10-26 NOTE — Telephone Encounter (Signed)
Spoke with pt's daughter who reports pt had one episode of chest pain this AM. Took one NTG and got relief after 10 minutes. No chest pain at this time and feels fine. Felt tired over weekend but no chest pain. Last episode of chest pain was about 3 weeks ago and he got relief with one NTG.  He has cancelled appt for rehab today.  I told daughter to continue to monitor pt and if chest pain occurs again today he should go to ED. Also knows to go to ED if he takes 3 NTG without relief. Daughter states pt wants to cancel all cardiac rehab appointments at this time but would like MD's recommendation. Will send to Dr. Irish Lack to address. Daughter states pt has lab appt on Wednesday. She states pt would like to see Dr. Lovena Le this week.  Will forward to [place] to review schedule.

## 2013-10-26 NOTE — Telephone Encounter (Signed)
Follow up:    Per pt returning your call.  Pt wants to see [name].  Please give him a call back.

## 2013-10-26 NOTE — Telephone Encounter (Signed)
I think cardiac rehab is beneficial for him to get his strength back.  It is good for him to go if he can.  It is ok if he has to use a NTG tab every so often.

## 2013-10-26 NOTE — Telephone Encounter (Signed)
See below

## 2013-10-26 NOTE — Telephone Encounter (Signed)
Dr. Irish Lack is his primary cardiologist and will wait and see his recomendations

## 2013-10-26 NOTE — Telephone Encounter (Addendum)
Pts daughter-in-law notified. She states that since the heart cath pt hasnt felt well. He actually feels worse than he did prior to cath. I told pts daughter-in-law that once stamina improves pt should start to feel better. Pt wants to see Dr. Lovena Le on Wednesday to so that Dr. Lovena Le can go over why pacemaker was not placed.

## 2013-10-27 NOTE — Telephone Encounter (Signed)
Spoke with daughter-in-law and she says he is slow to recover after his cath and the patient does not understand why he is still having pain and not 100%.  He feels worse now and than before the procedure.  He wants to come in and see Dr. Irish Lack prior to the 11/16/13 to talk about what to be expected for his progress.  I have explained to her the reason for no PPM placement and he is wanting to be seen as he feels worse now than before the cath.  I let her know I would forward to Amy and Dr Irish Lack to review.  Patient is coming in for labs tomorrow

## 2013-10-28 ENCOUNTER — Encounter (HOSPITAL_COMMUNITY): Admission: RE | Admit: 2013-10-28 | Payer: Medicare Other | Source: Ambulatory Visit

## 2013-10-28 ENCOUNTER — Other Ambulatory Visit: Payer: Medicare Other

## 2013-10-29 ENCOUNTER — Other Ambulatory Visit: Payer: Medicare Other

## 2013-10-29 ENCOUNTER — Telehealth (HOSPITAL_COMMUNITY): Payer: Self-pay | Admitting: *Deleted

## 2013-10-29 DIAGNOSIS — I2510 Atherosclerotic heart disease of native coronary artery without angina pectoris: Secondary | ICD-10-CM

## 2013-10-29 DIAGNOSIS — I447 Left bundle-branch block, unspecified: Secondary | ICD-10-CM

## 2013-10-29 DIAGNOSIS — R001 Bradycardia, unspecified: Secondary | ICD-10-CM

## 2013-10-29 DIAGNOSIS — I10 Essential (primary) hypertension: Secondary | ICD-10-CM

## 2013-10-29 DIAGNOSIS — I4891 Unspecified atrial fibrillation: Secondary | ICD-10-CM

## 2013-10-29 DIAGNOSIS — I5022 Chronic systolic (congestive) heart failure: Secondary | ICD-10-CM

## 2013-10-29 NOTE — Telephone Encounter (Signed)
Spoke with pt and he is feeling better than he was. Pt still c/o fatigue and SOB. I spoke with Dr. Irish Lack and if pt feels stable we can continue to monitor symptoms. Pt states he is taking this week off at cardiac rehab and he will go back on Monday. He will call if symptoms worsen or become more worrisome. Pt thanked me for calling and checking on him.

## 2013-10-30 ENCOUNTER — Encounter (HOSPITAL_COMMUNITY): Admission: RE | Admit: 2013-10-30 | Payer: Medicare Other | Source: Ambulatory Visit

## 2013-11-03 ENCOUNTER — Encounter: Payer: Self-pay | Admitting: Interventional Cardiology

## 2013-11-03 DIAGNOSIS — D320 Benign neoplasm of cerebral meninges: Secondary | ICD-10-CM | POA: Diagnosis not present

## 2013-11-03 DIAGNOSIS — N139 Obstructive and reflux uropathy, unspecified: Secondary | ICD-10-CM | POA: Diagnosis not present

## 2013-11-03 DIAGNOSIS — N401 Enlarged prostate with lower urinary tract symptoms: Secondary | ICD-10-CM | POA: Diagnosis not present

## 2013-11-03 DIAGNOSIS — R351 Nocturia: Secondary | ICD-10-CM | POA: Diagnosis not present

## 2013-11-03 DIAGNOSIS — N138 Other obstructive and reflux uropathy: Secondary | ICD-10-CM | POA: Diagnosis not present

## 2013-11-03 DIAGNOSIS — R300 Dysuria: Secondary | ICD-10-CM | POA: Diagnosis not present

## 2013-11-03 DIAGNOSIS — R39198 Other difficulties with micturition: Secondary | ICD-10-CM | POA: Diagnosis not present

## 2013-11-04 ENCOUNTER — Ambulatory Visit (HOSPITAL_COMMUNITY): Admit: 2013-11-04 | Payer: Medicare Other | Admitting: Internal Medicine

## 2013-11-04 ENCOUNTER — Encounter (HOSPITAL_COMMUNITY): Payer: Medicare Other

## 2013-11-04 ENCOUNTER — Encounter (HOSPITAL_COMMUNITY): Payer: Self-pay

## 2013-11-04 SURGERY — BI-VENTRICULAR PACEMAKER INSERTION (CRT-P)
Anesthesia: LOCAL

## 2013-11-06 ENCOUNTER — Telehealth (HOSPITAL_COMMUNITY): Payer: Self-pay | Admitting: *Deleted

## 2013-11-06 ENCOUNTER — Encounter (HOSPITAL_COMMUNITY): Payer: Medicare Other

## 2013-11-09 ENCOUNTER — Encounter (HOSPITAL_COMMUNITY): Payer: Medicare Other

## 2013-11-09 DIAGNOSIS — H02839 Dermatochalasis of unspecified eye, unspecified eyelid: Secondary | ICD-10-CM | POA: Diagnosis not present

## 2013-11-09 DIAGNOSIS — H4011X0 Primary open-angle glaucoma, stage unspecified: Secondary | ICD-10-CM | POA: Diagnosis not present

## 2013-11-09 DIAGNOSIS — Z961 Presence of intraocular lens: Secondary | ICD-10-CM | POA: Diagnosis not present

## 2013-11-11 ENCOUNTER — Encounter (HOSPITAL_COMMUNITY): Payer: Medicare Other

## 2013-11-12 ENCOUNTER — Ambulatory Visit
Admission: RE | Admit: 2013-11-12 | Discharge: 2013-11-12 | Disposition: A | Discharge status: Home / Self Care | Payer: Medicare Other | Source: Ambulatory Visit | Attending: Internal Medicine | Admitting: Internal Medicine

## 2013-11-12 ENCOUNTER — Ambulatory Visit (HOSPITAL_COMMUNITY)
Admission: RE | Admit: 2013-11-12 | Discharge: 2013-11-12 | Disposition: A | Discharge status: Home / Self Care | Payer: Medicare Other | Source: Ambulatory Visit | Attending: Internal Medicine | Admitting: Internal Medicine

## 2013-11-12 ENCOUNTER — Other Ambulatory Visit: Payer: Self-pay | Admitting: Internal Medicine

## 2013-11-12 ENCOUNTER — Other Ambulatory Visit (HOSPITAL_COMMUNITY): Payer: Self-pay | Admitting: Internal Medicine

## 2013-11-12 DIAGNOSIS — R5383 Other fatigue: Principal | ICD-10-CM

## 2013-11-12 DIAGNOSIS — R0789 Other chest pain: Secondary | ICD-10-CM | POA: Diagnosis not present

## 2013-11-12 DIAGNOSIS — G4700 Insomnia, unspecified: Secondary | ICD-10-CM | POA: Diagnosis not present

## 2013-11-12 DIAGNOSIS — R5381 Other malaise: Secondary | ICD-10-CM

## 2013-11-12 DIAGNOSIS — M7989 Other specified soft tissue disorders: Secondary | ICD-10-CM

## 2013-11-12 NOTE — Progress Notes (Signed)
VASCULAR LAB PRELIMINARY  PRELIMINARY  PRELIMINARY  PRELIMINARY  Right lower extremity venous duplex completed.    Preliminary report:  Right:  No evidence of DVT, superficial thrombosis, or Baker's cyst.  Sajan Cheatwood, RVT 11/12/2013, 7:07 PM

## 2013-11-13 ENCOUNTER — Encounter (HOSPITAL_COMMUNITY): Payer: Medicare Other

## 2013-11-16 ENCOUNTER — Encounter (HOSPITAL_COMMUNITY): Payer: Medicare Other

## 2013-11-16 ENCOUNTER — Ambulatory Visit (INDEPENDENT_AMBULATORY_CARE_PROVIDER_SITE_OTHER): Payer: Medicare Other | Admitting: Interventional Cardiology

## 2013-11-16 ENCOUNTER — Encounter: Payer: Self-pay | Admitting: Interventional Cardiology

## 2013-11-16 VITALS — BP 156/82 | HR 48 | Ht 67.0 in | Wt 186.0 lb

## 2013-11-16 DIAGNOSIS — I498 Other specified cardiac arrhythmias: Secondary | ICD-10-CM | POA: Diagnosis not present

## 2013-11-16 DIAGNOSIS — I10 Essential (primary) hypertension: Secondary | ICD-10-CM

## 2013-11-16 DIAGNOSIS — I4891 Unspecified atrial fibrillation: Secondary | ICD-10-CM

## 2013-11-16 DIAGNOSIS — I2510 Atherosclerotic heart disease of native coronary artery without angina pectoris: Secondary | ICD-10-CM

## 2013-11-16 DIAGNOSIS — I209 Angina pectoris, unspecified: Secondary | ICD-10-CM | POA: Diagnosis not present

## 2013-11-16 DIAGNOSIS — R5383 Other fatigue: Secondary | ICD-10-CM

## 2013-11-16 DIAGNOSIS — I480 Paroxysmal atrial fibrillation: Secondary | ICD-10-CM

## 2013-11-16 DIAGNOSIS — R5381 Other malaise: Secondary | ICD-10-CM | POA: Insufficient documentation

## 2013-11-16 DIAGNOSIS — R001 Bradycardia, unspecified: Secondary | ICD-10-CM

## 2013-11-16 NOTE — Patient Instructions (Addendum)
Your physician recommends that you continue on your current medications as directed. Please refer to the Current Medication list given to you today.  Your physician wants you to follow-up in: 4 months with Dr. Irish Lack.  You will receive a reminder letter in the mail two months in advance. If you don't receive a letter, please call our office to schedule the follow-up appointment. Patient requests December appointment - states going out of state in January 2016 (12/17 appointment scheduled)

## 2013-11-16 NOTE — Progress Notes (Signed)
Patient ID: [name], male   DOB: October 18, 1926, 80 y.o.   MRN: [id number]    [address], [address], [place]  [address] Phone: [phone] Fax:  [phone]  Date:  11/16/2013   ID:  [name], DOB 02-09-1927, MRN [id number]  PCP:  [name], MD      History of Present Illness: [name] is a 80 y.o. male with prior AFib, CAD and mitral regurgitation.  He most recently had a complex PCI of his LAD in July 2015 involving rotational atherectomy. He had a hematoma in his right wrist at the time of the diagnostic cath. He had a right groin hematoma after the intervention. Prior to the intervention, his ejection fraction was in the 3035% range. Echocardiogram about a month after the intervention showed an improvement in his EF from 50-55%. He continued to have some fatigue post procedure. He has had issues with his prostate and difficulty sleeping. He denies any further chest pain. He is trying to exercise at home. He uses a stationary bike and treadmill at times. He is not having discomfort in his chest. He denies shortness of breath. He thinks his overall fatigue is from lack of sleep. He is scheduled to have a prostate test done in October.   Wt Readings from Last 3 Encounters:  11/16/13 186 lb (84.369 kg)  10/12/13 186 lb (84.369 kg)  10/01/13 184 lb 11.9 oz (83.8 kg)     Past Medical History  Diagnosis Date  . Myocardial infarction 1980's- medical intervention  . PAF (paroxysmal atrial fibrillation)     controlled w/ amiodarone; not on coumadin due to hx of GI bleed  . Sleep apnea     non-compliant cpap  . Mitral regurgitation   . GERD (gastroesophageal reflux disease)     occ. take prevacid  . Meningioma right -sided w/ right VI palsy    followed by dr Gaynell Face  . Diverticulitis of colon with bleeding     s/p sigmoid resection '88  . History of GI diverticular bleed april 2012    transfused blood and resolved without surgical intervention    . Impotence, organic     s/p penile prosthesis 1990's  . DJD (degenerative joint disease) hips and knees    s/p bilateral total replacements  . DDD (degenerative disc disease)     chronic back pain  . BPH (benign prostatic hypertrophy) with urinary obstruction     s/p turp yrs ago  . Blood transfusion   . Impaired hearing bilateral     only left hearing aid  . Bradycardia     Amio d/c'd 08/2013; brady arrest 08/2013 after PCI >>> recurrent AF >>> Amiodarone restarted (may need BiV pacer if EF does not recover; consider Holter if + sx's)  . Cardiomyopathy     a. Echo (08/2013):  EF 30-35%, AS hypokinesis, Gr 1 diast dysfn, mild MR, mild LAE >>> b. improved EF 50-55% by echo 8/15  . CAD (coronary artery disease)     a. s/p MI and prior PCI of LAD;  b. LHC (08/2013):  prox LAD 60-70%, mid LAD stents ok, ostial lesion at Dx jailed by stent, mild CFX and RCA disesase >>>  PCI (09/08/13):  rotational atherectomy + Promus DES to prox LAD  . Mitral valve disorders   . Hx of echocardiogram     Echo (8/15):  Mod LVH, EF 50-55%, Gr 1 DD, mild MR, mild RAE    Current  Outpatient Prescriptions  Medication Sig Dispense Refill  . amiodarone (PACERONE) 100 MG tablet Take 1 tablet (100 mg total) by mouth daily.  30 tablet  6  . clopidogrel (PLAVIX) 75 MG tablet Take 75 mg by mouth daily.      . diazepam (VALIUM) 5 MG tablet Take 5 mg by mouth at bedtime as needed for anxiety.      [name] esomeprazole (NEXIUM) 20 MG capsule Take 20 mg by mouth daily.      [name] losartan (COZAAR) 50 MG tablet Take 25 mg by mouth daily. 1/2 tablet daily or as directed      . Multiple Vitamin (MULTIVITAMIN) tablet Take 1 tablet by mouth daily.        . nitroGLYCERIN (NITROSTAT) 0.4 MG SL tablet Place 1 tablet (0.4 mg total) under the tongue every 5 (five) minutes as needed for chest pain.  20 tablet  0  . Omega-3 Fatty Acids (FISH OIL) 1000 MG CAPS Take 1 capsule by mouth daily.      [name] OVER THE COUNTER MEDICATION Take 1 tablet by  mouth 2 (two) times daily. Vitamins for eyes      . OVER THE COUNTER MEDICATION Take 2 tablets by mouth daily. [facility] brand of Prostrate health      . psyllium (METAMUCIL) 58.6 % packet Take 1 packet by mouth daily as needed (for constipation).      . Saw Palmetto, Serenoa repens, (SAW PALMETTO PO) Take by mouth.      . sodium chloride (OCEAN) 0.65 % nasal spray Place 1 spray into the nose as needed for congestion.       No current facility-administered medications for this visit.    Allergies:    Allergies  Allergen Reactions  . Codeine Other (See Comments)    Insomnia/ hyper  . Warfarin Sodium Other (See Comments)    Hx gi bleed    Social History:  The patient  reports that he quit smoking about 52 years ago. He has never used smokeless tobacco. He reports that he does not drink alcohol or use illicit drugs.   Family History:  The patient's family history includes Cancer in an other family member; Heart attack in his mother; Heart disease in his mother.   ROS:  Please see the history of present illness.  No nausea, vomiting.  No fevers, chills.  No focal weakness.  No dysuria. Fatigue.   All other systems reviewed and negative.   PHYSICAL EXAM: VS:  BP 156/82  Pulse 48  Ht 5\' 7"  (1.702 m)  Wt 186 lb (84.369 kg)  BMI 29.12 kg/m2  SpO2 95% Well nourished, well developed, in no acute distress HEENT: normal Neck: no JVD, no carotid bruits Cardiac:  normal S1, S2; RRR; 2/6 systolic murmur Lungs:  clear to auscultation bilaterally, no wheezing, rhonchi or rales Abd: soft, nontender, no hepatomegaly Ext: Trace right ankle edema Skin: warm and dry Neuro:   no focal abnormalities noted      ASSESSMENT AND PLAN:  1. CAD: Continue the Benadryl given the most recent stent. He has a history of GI bleeding in the past. No aspirin at this time. He is not having angina. His ejection fraction is improved. Continue medical therapy. Some of his fatigue may be related to age and  deconditioning. 2. Mitral regurgitation: Was not as severe on most recent echocardiogram. No signs of CHF at this time. Continue to monitor. 3. AFib/flutter: Amiodarone was stopped after the diagnostic cath due to bradycardia.  He then had an episode of atrial fibrillation in the hospital. Low-dose amiodarone was restarted. He has not had any symptoms of bradycardia. He will let us know if he has any lightheadedness or presyncope. 4. Hypertension: His blood pressure after exercise can be mildly elevated. Typically, it runs in the 5:63 systolic range. If his blood pressure increases, would increase his losartan up to 25 mg daily. He is comfortable staying on the half tablet currently.  Signed, Mina Marble, MD, [facility] 11/16/2013 9:28 AM

## 2013-11-18 ENCOUNTER — Encounter (HOSPITAL_COMMUNITY): Payer: Medicare Other

## 2013-11-18 ENCOUNTER — Telehealth (HOSPITAL_COMMUNITY): Payer: Self-pay | Admitting: *Deleted

## 2013-11-20 ENCOUNTER — Encounter (HOSPITAL_COMMUNITY): Payer: Medicare Other

## 2013-11-23 ENCOUNTER — Encounter (HOSPITAL_COMMUNITY): Payer: Medicare Other

## 2013-11-25 ENCOUNTER — Encounter (HOSPITAL_COMMUNITY): Payer: Medicare Other

## 2013-11-26 DIAGNOSIS — J341 Cyst and mucocele of nose and nasal sinus: Secondary | ICD-10-CM | POA: Diagnosis not present

## 2013-11-26 DIAGNOSIS — Z483 Aftercare following surgery for neoplasm: Secondary | ICD-10-CM | POA: Diagnosis not present

## 2013-11-26 DIAGNOSIS — D320 Benign neoplasm of cerebral meninges: Secondary | ICD-10-CM | POA: Diagnosis not present

## 2013-11-26 DIAGNOSIS — Z923 Personal history of irradiation: Secondary | ICD-10-CM | POA: Diagnosis not present

## 2013-11-26 DIAGNOSIS — D329 Benign neoplasm of meninges, unspecified: Secondary | ICD-10-CM | POA: Diagnosis not present

## 2013-11-27 ENCOUNTER — Encounter (HOSPITAL_COMMUNITY): Payer: Medicare Other

## 2013-11-30 ENCOUNTER — Encounter (HOSPITAL_COMMUNITY): Payer: Medicare Other

## 2013-11-30 DIAGNOSIS — C61 Malignant neoplasm of prostate: Secondary | ICD-10-CM

## 2013-11-30 DIAGNOSIS — Z418 Encounter for other procedures for purposes other than remedying health state: Secondary | ICD-10-CM | POA: Diagnosis not present

## 2013-11-30 DIAGNOSIS — R972 Elevated prostate specific antigen [PSA]: Secondary | ICD-10-CM | POA: Diagnosis not present

## 2013-11-30 HISTORY — DX: Malignant neoplasm of prostate: C61

## 2013-11-30 HISTORY — PX: PROSTATE BIOPSY: SHX241

## 2013-12-02 ENCOUNTER — Encounter (HOSPITAL_COMMUNITY): Payer: Medicare Other

## 2013-12-04 ENCOUNTER — Encounter (HOSPITAL_COMMUNITY): Payer: Medicare Other

## 2013-12-04 DIAGNOSIS — Z23 Encounter for immunization: Secondary | ICD-10-CM | POA: Diagnosis not present

## 2013-12-07 ENCOUNTER — Encounter (HOSPITAL_COMMUNITY): Payer: Medicare Other

## 2013-12-08 DIAGNOSIS — C61 Malignant neoplasm of prostate: Secondary | ICD-10-CM | POA: Diagnosis not present

## 2013-12-08 DIAGNOSIS — R3915 Urgency of urination: Secondary | ICD-10-CM | POA: Diagnosis not present

## 2013-12-08 DIAGNOSIS — R972 Elevated prostate specific antigen [PSA]: Secondary | ICD-10-CM | POA: Diagnosis not present

## 2013-12-08 DIAGNOSIS — N401 Enlarged prostate with lower urinary tract symptoms: Secondary | ICD-10-CM | POA: Diagnosis not present

## 2013-12-08 DIAGNOSIS — R3912 Poor urinary stream: Secondary | ICD-10-CM | POA: Diagnosis not present

## 2013-12-08 DIAGNOSIS — R351 Nocturia: Secondary | ICD-10-CM | POA: Diagnosis not present

## 2013-12-09 ENCOUNTER — Encounter (HOSPITAL_COMMUNITY): Payer: Medicare Other

## 2013-12-10 ENCOUNTER — Other Ambulatory Visit (HOSPITAL_COMMUNITY): Payer: Medicare Other

## 2013-12-10 DIAGNOSIS — D1801 Hemangioma of skin and subcutaneous tissue: Secondary | ICD-10-CM | POA: Diagnosis not present

## 2013-12-10 DIAGNOSIS — L821 Other seborrheic keratosis: Secondary | ICD-10-CM | POA: Diagnosis not present

## 2013-12-10 DIAGNOSIS — L570 Actinic keratosis: Secondary | ICD-10-CM | POA: Diagnosis not present

## 2013-12-10 DIAGNOSIS — L814 Other melanin hyperpigmentation: Secondary | ICD-10-CM | POA: Diagnosis not present

## 2013-12-10 DIAGNOSIS — Z85828 Personal history of other malignant neoplasm of skin: Secondary | ICD-10-CM | POA: Diagnosis not present

## 2013-12-11 ENCOUNTER — Encounter (HOSPITAL_COMMUNITY): Payer: Medicare Other

## 2013-12-14 ENCOUNTER — Encounter (HOSPITAL_COMMUNITY): Payer: Medicare Other

## 2013-12-14 ENCOUNTER — Encounter: Payer: Self-pay | Admitting: Radiation Oncology

## 2013-12-14 DIAGNOSIS — C61 Malignant neoplasm of prostate: Secondary | ICD-10-CM | POA: Diagnosis not present

## 2013-12-14 DIAGNOSIS — N35011 Post-traumatic bulbous urethral stricture: Secondary | ICD-10-CM | POA: Diagnosis not present

## 2013-12-14 NOTE — Progress Notes (Addendum)
GU Location of Tumor / Histology: prostate adenocarcinoma  If Prostate Cancer, Gleason Score is (4 + 4) and PSA is (4.99)  [name] presented 2012 with signs/symptoms of: s/p TURP with prostate regrowth, incomplete emptying  Biopsies of prostate (if applicable) revealed:  59/9/77   Past/Anticipated interventions by urology, if any: biopsy  Past/Anticipated interventions by medical oncology, if any: no  Weight changes, if any: loss of appetite, states "food doesn't taste like it used to", no weight loss  Bowel/Bladder complaints, if any:  IPSS 21, weak stream, nocturia x 3, incomplete emptying, frequency  Nausea/Vomiting, if any: no  Pain issues, if any:  Chronic lower back pain, left side, hx several back surgeries  SAFETY ISSUES:  Prior radiation? no  Pacemaker/ICD? no  Possible current pregnancy? NA  Is the patient on methotrexate? no  Current Complaints / other details:  Lives at home with CNA, divorced, 1 son, 1 daughter Former patient of Dr Diona Fanti, history of TURP, TUR of bladder neck, inflatable penile prosthesis 25 years ago.  Patient to obtain second opinion for treatment. Not a good candidate for radical prostatectomy or iodine seed therapy- Dr Gaynelle Arabian Patient scored 6/10 on distress screen. He also scored high on Epic depression screen. He states if he "could sleep more he'd feel better". This RN offered to call SW to see pt today, and he requests to see SW today if available. Left vm for [name], SW.

## 2013-12-15 ENCOUNTER — Ambulatory Visit
Admission: RE | Admit: 2013-12-15 | Discharge: 2013-12-15 | Disposition: A | Discharge status: Home / Self Care | Payer: Medicare Other | Source: Ambulatory Visit | Attending: Radiation Oncology | Admitting: Radiation Oncology

## 2013-12-15 ENCOUNTER — Encounter: Payer: Self-pay | Admitting: Radiation Oncology

## 2013-12-15 VITALS — BP 125/68 | HR 48 | Temp 97.8°F | Resp 20 | Ht 67.0 in | Wt 186.2 lb

## 2013-12-15 DIAGNOSIS — Z510 Encounter for antineoplastic radiation therapy: Secondary | ICD-10-CM | POA: Insufficient documentation

## 2013-12-15 DIAGNOSIS — C61 Malignant neoplasm of prostate: Secondary | ICD-10-CM | POA: Diagnosis not present

## 2013-12-15 HISTORY — DX: Major depressive disorder, single episode, unspecified: F32.9

## 2013-12-15 HISTORY — DX: Malignant neoplasm of prostate: C61

## 2013-12-15 HISTORY — DX: Depression, unspecified: F32.A

## 2013-12-15 HISTORY — DX: Anxiety disorder, unspecified: F41.9

## 2013-12-15 NOTE — Progress Notes (Signed)
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 male who is seen today through the courtesy of [name] for discussion of possible radiation therapy in the management of his stage T1c high-risk adenocarcinoma prostate. He was seen by [name] this past September for further evaluation of nocturia with severe bladder outlet obstruction. He had a TURP in the past and also a TUR of a bladder neck contracture. His I PSS score was 26. A PSA from November 13, 2013 was 4.99 with a percentage free PSA of only 19%. He underwent ultrasound-guided biopsies on 11/30/2013 and he was found to have Gleason 8 (4+4) involving 90% of one core from left lateral mid gland. He also had Gleason 7 (4+3) involving 80% of one core from the left mid gland, 50% of one core from the left lateral apex and 80% of one core from left apex. He Gleason 6 (3+3) involving 80% of one core from the right lateral mid gland and 5% of one core from the right lateral apex. There was perineural invasion. His prostate volume was 21.1 cc. He was started on Rapaflo and finasteride by [name] and his I PSS score today is 21. I understand he was seen by [name] yesterday; he does not feel that he is a surgical candidate. He has not yet had a bone scan or CT scan of his abdomen/pelvis to complete his staging workup.[name] He seen today with his daughter-in-law, [name]. Of note is that he has had bilateral hip replacements.  PREVIOUS RADIATION THERAPY: Status post gamma knife radiosurgery at [facility] for benign meningioma approximately 15 years ago ([name])   PAST MEDICAL HISTORY:   has a past medical history of Myocardial infarction (4270'W- medical intervention); PAF (paroxysmal atrial fibrillation); Sleep apnea; Mitral regurgitation; GERD (gastroesophageal reflux disease); Meningioma (right -sided w/ right VI palsy); Diverticulitis of colon with bleeding; History of GI diverticular bleed (april 2012); Impotence, organic; DJD (degenerative joint disease) (hips and knees); DDD (degenerative disc disease); BPH (benign prostatic hypertrophy) with urinary obstruction; Blood transfusion; Impaired hearing (bilateral ); Bradycardia; Cardiomyopathy; CAD (coronary artery disease); Mitral valve disorders; echocardiogram; Prostate cancer (11/30/13); Atrial fibrillation; Heart murmur; Anxiety; and Depression.     PAST SURGICAL HISTORY:  Past Surgical History  Procedure Laterality Date  . Cholecystectomy    . Joint replacement  03-25-08--total right hip     s/ revision's x2 and sev. closed reduction for dislocation last one 06-20-09  . Joint replacement  2005    total left hip  . Joint replacement  2004    total right knee and revision  . Joint replacement  1997    total left knee  . Hernia repair      bilateral inguinal hernia repair  . Cataract extraction w/ intraocular lens  implant, bilateral    . Appendectomy    . Transurethral resection of prostate  yrs ago  . Penile prosthesis implant  1990's  . Rotator cuff repair  left  . Right ear surgery  yrs ago  . Cardiac catheterization  2007    noncritical cad (results w/  chart)  . Coronary angioplasty with stent placement  08-03-08    drug-eluting stent x2 distal and mid lad  . Transurethral resection of prostate  01/08/2011    Procedure: TRANSURETHRAL RESECTION OF THE PROSTATE (TURP);  Surgeon: Franchot [name];  Location: [facility];  Service: Urology;  Laterality: N/A;  GYRUS OWER  . Prostate biopsy  11/30/13    Gleason 8, vol 22.14 cc  . Gamma knife radiation  2000    [facility] for meningioma, last eval 2013-  no change     FAMILY HISTORY: family history includes Cancer in his brother and another family member; Emphysema in his father; Heart attack in his mother; Heart disease in his mother. His father died at age 63, and his mother died at age 44. No family history of prostate cancer.   SOCIAL HISTORY:  reports that he quit smoking about 54 years ago. He has never used smokeless tobacco. He reports that he does not drink alcohol or use illicit drugs. For the past 5 years, 2 children, and multiple grandchildren. He worked as a Development worker, international aid, and also worked as a Scientist, water quality. He spent time as a Psychologist, sport and exercise as well.   ALLERGIES: Codeine and Warfarin sodium   MEDICATIONS:  Current Outpatient Prescriptions  Medication Sig Dispense Refill  . amiodarone (PACERONE) 100 MG tablet Take 1 tablet (100 mg total) by mouth daily.  30 tablet  6  . clopidogrel (PLAVIX) 75 MG tablet Take 75 mg by mouth daily.      . diazepam (VALIUM) 5 MG tablet Take 5 mg by mouth at bedtime as needed for anxiety.      [name] esomeprazole (NEXIUM) 20 MG capsule Take 20 mg by mouth daily.      . finasteride (PROSCAR) 5 MG tablet Take 5 mg by mouth daily.      [name] losartan (COZAAR) 50 MG tablet Take 25 mg by mouth daily. 1/2 tablet daily or as directed      . Multiple Vitamin (MULTIVITAMIN) tablet Take 1 tablet by mouth daily.        . nitroGLYCERIN (NITROSTAT) 0.4 MG SL tablet Place 1 tablet (0.4 mg total) under the tongue every 5 (five) minutes as needed for chest pain.  20 tablet  0  . Omega-3 Fatty Acids (FISH OIL) 1000 MG CAPS Take 1 capsule by mouth daily.      [name] OVER THE COUNTER MEDICATION Take 1 tablet by mouth 2 (two) times daily. Vitamins for eyes      . OVER THE COUNTER MEDICATION Take 2 tablets by mouth daily. [facility] brand of Prostrate health      . psyllium (METAMUCIL) 58.6 % packet Take 1 packet by mouth daily as needed (for constipation).      . Saw Palmetto, Serenoa repens, (SAW PALMETTO PO) Take by mouth.       . silodosin (RAPAFLO) 8 MG CAPS capsule Take 8 mg by mouth daily with breakfast.      . sodium chloride (OCEAN) 0.65 % nasal spray Place 1 spray into the nose as needed for congestion.       No current facility-administered medications for this encounter.     REVIEW OF SYSTEMS:  Pertinent items are noted in HPI.    PHYSICAL EXAM:  height is 5\' 7"  (1.702 m) and weight is 186 lb 3.2 oz (84.46 kg). His oral temperature is 97.8 F (36.6 C). His blood pressure is 125/68 and his pulse is 48. His respiration is 20.  Head and neck examination: Grossly unremarkable. Nodes: Without palpable cervical or supraclavicular lymphadenopathy. Chest: Lungs clear. Back: Without spinal or CVA tenderness. Abdomen: Without masses or organomegaly. Genitalia: Unremarkable to inspection. Rectal: The prostate gland is normal in size and is without focal induration or nodularity. Extremities: Trace ankle edema.   LABORATORY DATA:  Lab Results  Component Value Date   WBC 6.6 09/17/2013   HGB 12.1* 09/17/2013   HCT 35.7* 09/17/2013   MCV 94.3 09/17/2013   PLT 225.0 09/17/2013   Lab Results  Component Value Date   NA 136* 09/09/2013   K 4.4 09/09/2013   CL 98 09/09/2013   CO2 20 09/09/2013   Lab Results  Component Value Date   ALT 22 05/23/2012   AST 22 05/23/2012   ALKPHOS 100 05/23/2012   BILITOT 1.3* 05/23/2012    PSA 4.99 from 11/03/2013   IMPRESSION: Stage T1c high-risk adenocarcinoma prostate. I explained to the patient and his daughter-in-law that his prognosis is related to his stage, Gleason score, and PSA level. His stage and PSA level are favorable while his Gleason score of 8 is unfavorable. This places him in the "high-risk" disease category. He is at increased risk for extracapsular extension, metastatic disease, and death within 5 years. We discussed androgen deprivation therapy alone versus cryotherapy  versus radiation therapy. Radiation therapy options include 8 weeks of external beam/IMRT or 5  weeks of external beam followed by seed implantation. Radiation therapy options should include androgen deprivation therapy which has been shown to improve survival compared to radiation therapy alone. We discussed the potential acute and late toxicities of radiation therapy. Of some concern is his bilateral hip replacements which may affect not only accurate localization of his prostate/seminal vesicles, but also could affect dose delivery with external beam/IMRT. Obviously, we will like to avoid a seed implant boost because of his obstructive symptomatology and also cardiac risk. I discussed his cardiac risk with Dr. Radford Pax who is covering for Dr. Lacie Draft, , and she feels that he can certainly have at least a 5 year survival. I discussed his management with [name] he will complete his staging workup with a bone scan and CT scan of the abdomen/pelvis. He will also perform cystoscopy to see if he has recurrence of his bladder neck stricture which could be dilated and improve his obstructive symptoms. With respect to androgen deprivation therapy, the standard of care of be to give this for 2 years, but considering his cardiac risk I think that one year would be a good compromise. He typically returns from [place] in late February, so he can  start his IMRT in March. Therefore, he would be on androgen deprivation therapy for just over 4 months before starting his radiation therapy. I think this is perfectly acceptable and will allow him to spend time with his grandchildren.   PLAN: As above. I will see Mr. Stelly for a followup visit in 2 months before he goes to [place] for the winter.   I spent 60 minutes minutes face to face with the patient and more than 50% of that time was spent in counseling and/or coordination of care.

## 2013-12-15 NOTE — Progress Notes (Signed)
Please see the Nurse Progress Note in the MD Initial Consult Encounter for this patient. 

## 2013-12-16 ENCOUNTER — Other Ambulatory Visit: Payer: Self-pay | Admitting: Urology

## 2013-12-16 ENCOUNTER — Other Ambulatory Visit (HOSPITAL_COMMUNITY): Payer: Self-pay | Admitting: Urology

## 2013-12-16 ENCOUNTER — Encounter (HOSPITAL_COMMUNITY): Payer: Medicare Other

## 2013-12-16 DIAGNOSIS — C61 Malignant neoplasm of prostate: Secondary | ICD-10-CM

## 2013-12-18 ENCOUNTER — Encounter (HOSPITAL_COMMUNITY): Payer: Medicare Other

## 2013-12-21 ENCOUNTER — Encounter (HOSPITAL_COMMUNITY): Payer: Medicare Other

## 2013-12-23 ENCOUNTER — Encounter (HOSPITAL_COMMUNITY): Payer: Medicare Other

## 2013-12-24 ENCOUNTER — Encounter: Payer: Self-pay | Admitting: *Deleted

## 2013-12-24 NOTE — Progress Notes (Signed)
[facility] Psychosocial Distress Screening Clinical Social Work  Clinical Social Work was referred by distress screening protocol.  The patient scored a 6 on the Psychosocial Distress Thermometer which indicates moderate distress. Clinical Social Worker contacted patient by phone to assess for distress and other psychosocial needs.  [name] shared he is "not feeling well today".  [name] had questions regarding his upcoming appointments and questions regarding treatment.  CSW answered basic questions and encouraged him to address other questions with his medical team.  CSW shared information on support services and provided brief emotional support.  Patient was interested in utilizing support services if treated at the cancer center.  ONCBCN DISTRESS SCREENING 12/15/2013  Screening Type Initial Screening  Elta Guadeloupe the number that describes how much distress you have been experiencing in the past week 6  Emotional problem type Depression;Nervousness/Anxiety;Adjusting to illness;Isolation/feeling alone;Feeling hopeless;Boredom;Adjusting to appearance changes  Spiritual/Religous concerns type Facing my mortality  Information Concerns Type Lack of info about diagnosis;Lack of info about treatment;Lack of info about complementary therapy choices;Lack of info about maintaining fitness  Physical Problem type Pain;Sleep/insomnia;Getting around;Bathing/dressing;Breathing;Loss of appetitie;Constipation/diarrhea;Changes in urination;Tingling hands/feet;Swollen arms/legs  Other "adjusting to my illness" listed as most distressing, contact by phone if can't FU today    Clinical Social Worker follow up needed: No  If yes, follow up plan:  Patient will contact CSW with any questions or concerns.  Polo Riley, MSW, LCSW, OSW-C Clinical Social Worker [facility] [phone]

## 2013-12-25 ENCOUNTER — Encounter (HOSPITAL_COMMUNITY): Payer: Medicare Other

## 2013-12-25 ENCOUNTER — Ambulatory Visit (HOSPITAL_COMMUNITY)
Admission: RE | Admit: 2013-12-25 | Discharge: 2013-12-25 | Disposition: A | Discharge status: Home / Self Care | Payer: Medicare Other | Source: Ambulatory Visit | Attending: Urology | Admitting: Urology

## 2013-12-25 DIAGNOSIS — Z96643 Presence of artificial hip joint, bilateral: Secondary | ICD-10-CM | POA: Insufficient documentation

## 2013-12-25 DIAGNOSIS — Z96653 Presence of artificial knee joint, bilateral: Secondary | ICD-10-CM | POA: Insufficient documentation

## 2013-12-25 DIAGNOSIS — C61 Malignant neoplasm of prostate: Secondary | ICD-10-CM | POA: Diagnosis not present

## 2013-12-25 DIAGNOSIS — R52 Pain, unspecified: Secondary | ICD-10-CM | POA: Diagnosis present

## 2013-12-25 MED ORDER — TECHNETIUM TC 99M MEDRONATE IV KIT
26.2000 | PACK | Freq: Once | INTRAVENOUS | Status: AC | PRN
  Administered 2013-12-25: 26.2 via INTRAVENOUS

## 2013-12-28 ENCOUNTER — Encounter (HOSPITAL_COMMUNITY): Payer: Medicare Other

## 2013-12-28 DIAGNOSIS — R3912 Poor urinary stream: Secondary | ICD-10-CM | POA: Diagnosis not present

## 2013-12-28 DIAGNOSIS — R972 Elevated prostate specific antigen [PSA]: Secondary | ICD-10-CM | POA: Diagnosis not present

## 2013-12-28 DIAGNOSIS — C61 Malignant neoplasm of prostate: Secondary | ICD-10-CM | POA: Diagnosis not present

## 2013-12-28 DIAGNOSIS — R3915 Urgency of urination: Secondary | ICD-10-CM | POA: Diagnosis not present

## 2013-12-30 ENCOUNTER — Encounter (HOSPITAL_COMMUNITY): Payer: Medicare Other

## 2014-01-01 ENCOUNTER — Encounter (HOSPITAL_COMMUNITY): Payer: Medicare Other

## 2014-01-04 ENCOUNTER — Encounter (HOSPITAL_COMMUNITY): Payer: Medicare Other

## 2014-01-06 ENCOUNTER — Encounter (HOSPITAL_COMMUNITY): Payer: Medicare Other

## 2014-01-08 ENCOUNTER — Encounter (HOSPITAL_COMMUNITY): Payer: Medicare Other

## 2014-01-11 ENCOUNTER — Encounter (HOSPITAL_COMMUNITY): Payer: Medicare Other

## 2014-01-19 DIAGNOSIS — Z6827 Body mass index (BMI) 27.0-27.9, adult: Secondary | ICD-10-CM | POA: Diagnosis not present

## 2014-01-19 DIAGNOSIS — I10 Essential (primary) hypertension: Secondary | ICD-10-CM | POA: Diagnosis not present

## 2014-01-19 DIAGNOSIS — M5136 Other intervertebral disc degeneration, lumbar region: Secondary | ICD-10-CM | POA: Diagnosis not present

## 2014-02-03 ENCOUNTER — Encounter (INDEPENDENT_AMBULATORY_CARE_PROVIDER_SITE_OTHER): Payer: Medicare Other | Admitting: Ophthalmology

## 2014-02-03 DIAGNOSIS — I10 Essential (primary) hypertension: Secondary | ICD-10-CM

## 2014-02-03 DIAGNOSIS — H35033 Hypertensive retinopathy, bilateral: Secondary | ICD-10-CM | POA: Diagnosis not present

## 2014-02-03 DIAGNOSIS — H34832 Tributary (branch) retinal vein occlusion, left eye: Secondary | ICD-10-CM | POA: Diagnosis not present

## 2014-02-03 DIAGNOSIS — H43813 Vitreous degeneration, bilateral: Secondary | ICD-10-CM

## 2014-02-03 DIAGNOSIS — H3531 Nonexudative age-related macular degeneration: Secondary | ICD-10-CM

## 2014-02-04 ENCOUNTER — Encounter (HOSPITAL_COMMUNITY): Payer: Self-pay | Admitting: Interventional Cardiology

## 2014-02-09 ENCOUNTER — Emergency Department (HOSPITAL_BASED_OUTPATIENT_CLINIC_OR_DEPARTMENT_OTHER): Payer: Medicare Other

## 2014-02-09 ENCOUNTER — Inpatient Hospital Stay (HOSPITAL_BASED_OUTPATIENT_CLINIC_OR_DEPARTMENT_OTHER)
Admission: EM | Admit: 2014-02-09 | Discharge: 2014-02-11 | DRG: 378 | Disposition: A | Discharge status: Home / Self Care | Payer: Medicare Other | Attending: Internal Medicine | Admitting: Internal Medicine

## 2014-02-09 ENCOUNTER — Encounter (HOSPITAL_BASED_OUTPATIENT_CLINIC_OR_DEPARTMENT_OTHER): Payer: Self-pay | Admitting: *Deleted

## 2014-02-09 DIAGNOSIS — Z9842 Cataract extraction status, left eye: Secondary | ICD-10-CM

## 2014-02-09 DIAGNOSIS — K222 Esophageal obstruction: Secondary | ICD-10-CM | POA: Diagnosis present

## 2014-02-09 DIAGNOSIS — K625 Hemorrhage of anus and rectum: Secondary | ICD-10-CM | POA: Diagnosis not present

## 2014-02-09 DIAGNOSIS — F329 Major depressive disorder, single episode, unspecified: Secondary | ICD-10-CM | POA: Diagnosis present

## 2014-02-09 DIAGNOSIS — Z9119 Patient's noncompliance with other medical treatment and regimen: Secondary | ICD-10-CM | POA: Diagnosis present

## 2014-02-09 DIAGNOSIS — I252 Old myocardial infarction: Secondary | ICD-10-CM | POA: Diagnosis not present

## 2014-02-09 DIAGNOSIS — K922 Gastrointestinal hemorrhage, unspecified: Secondary | ICD-10-CM | POA: Diagnosis not present

## 2014-02-09 DIAGNOSIS — K219 Gastro-esophageal reflux disease without esophagitis: Secondary | ICD-10-CM | POA: Diagnosis present

## 2014-02-09 DIAGNOSIS — Z87891 Personal history of nicotine dependence: Secondary | ICD-10-CM

## 2014-02-09 DIAGNOSIS — Z96653 Presence of artificial knee joint, bilateral: Secondary | ICD-10-CM | POA: Diagnosis present

## 2014-02-09 DIAGNOSIS — K2960 Other gastritis without bleeding: Secondary | ICD-10-CM | POA: Diagnosis present

## 2014-02-09 DIAGNOSIS — K921 Melena: Secondary | ICD-10-CM | POA: Diagnosis not present

## 2014-02-09 DIAGNOSIS — I5022 Chronic systolic (congestive) heart failure: Secondary | ICD-10-CM | POA: Diagnosis present

## 2014-02-09 DIAGNOSIS — Z96643 Presence of artificial hip joint, bilateral: Secondary | ICD-10-CM | POA: Diagnosis present

## 2014-02-09 DIAGNOSIS — I447 Left bundle-branch block, unspecified: Secondary | ICD-10-CM | POA: Diagnosis present

## 2014-02-09 DIAGNOSIS — K449 Diaphragmatic hernia without obstruction or gangrene: Secondary | ICD-10-CM | POA: Diagnosis present

## 2014-02-09 DIAGNOSIS — G8929 Other chronic pain: Secondary | ICD-10-CM | POA: Diagnosis present

## 2014-02-09 DIAGNOSIS — I10 Essential (primary) hypertension: Secondary | ICD-10-CM | POA: Diagnosis present

## 2014-02-09 DIAGNOSIS — R079 Chest pain, unspecified: Secondary | ICD-10-CM | POA: Diagnosis present

## 2014-02-09 DIAGNOSIS — I2510 Atherosclerotic heart disease of native coronary artery without angina pectoris: Secondary | ICD-10-CM | POA: Diagnosis not present

## 2014-02-09 DIAGNOSIS — R001 Bradycardia, unspecified: Secondary | ICD-10-CM | POA: Diagnosis present

## 2014-02-09 DIAGNOSIS — C61 Malignant neoplasm of prostate: Secondary | ICD-10-CM | POA: Diagnosis not present

## 2014-02-09 DIAGNOSIS — Z923 Personal history of irradiation: Secondary | ICD-10-CM | POA: Diagnosis not present

## 2014-02-09 DIAGNOSIS — G4730 Sleep apnea, unspecified: Secondary | ICD-10-CM | POA: Diagnosis present

## 2014-02-09 DIAGNOSIS — I5042 Chronic combined systolic (congestive) and diastolic (congestive) heart failure: Secondary | ICD-10-CM | POA: Diagnosis present

## 2014-02-09 DIAGNOSIS — Z961 Presence of intraocular lens: Secondary | ICD-10-CM | POA: Diagnosis present

## 2014-02-09 DIAGNOSIS — I7090 Unspecified atherosclerosis: Secondary | ICD-10-CM | POA: Diagnosis not present

## 2014-02-09 DIAGNOSIS — D649 Anemia, unspecified: Secondary | ICD-10-CM | POA: Diagnosis not present

## 2014-02-09 DIAGNOSIS — Z9079 Acquired absence of other genital organ(s): Secondary | ICD-10-CM | POA: Diagnosis present

## 2014-02-09 DIAGNOSIS — Z9841 Cataract extraction status, right eye: Secondary | ICD-10-CM | POA: Diagnosis not present

## 2014-02-09 DIAGNOSIS — Z888 Allergy status to other drugs, medicaments and biological substances status: Secondary | ICD-10-CM | POA: Diagnosis not present

## 2014-02-09 DIAGNOSIS — N400 Enlarged prostate without lower urinary tract symptoms: Secondary | ICD-10-CM | POA: Diagnosis present

## 2014-02-09 DIAGNOSIS — I480 Paroxysmal atrial fibrillation: Secondary | ICD-10-CM | POA: Diagnosis not present

## 2014-02-09 DIAGNOSIS — I340 Nonrheumatic mitral (valve) insufficiency: Secondary | ICD-10-CM | POA: Diagnosis present

## 2014-02-09 DIAGNOSIS — F419 Anxiety disorder, unspecified: Secondary | ICD-10-CM | POA: Diagnosis present

## 2014-02-09 HISTORY — DX: Low back pain: M54.5

## 2014-02-09 HISTORY — DX: Unspecified osteoarthritis, unspecified site: M19.90

## 2014-02-09 HISTORY — DX: Retention of urine, unspecified: R33.9

## 2014-02-09 HISTORY — DX: Essential (primary) hypertension: I10

## 2014-02-09 HISTORY — DX: Other chronic pain: G89.29

## 2014-02-09 HISTORY — DX: Low back pain, unspecified: M54.50

## 2014-02-09 LAB — CBC
HCT: 34 % — ABNORMAL LOW (ref 39.0–52.0)
Hemoglobin: 11.9 g/dL — ABNORMAL LOW (ref 13.0–17.0)
MCH: 32 pg (ref 26.0–34.0)
MCHC: 35 g/dL (ref 30.0–36.0)
MCV: 91.4 fL (ref 78.0–100.0)
PLATELETS: 148 10*3/uL — AB (ref 150–400)
RBC: 3.72 MIL/uL — AB (ref 4.22–5.81)
RDW: 14.9 % (ref 11.5–15.5)
WBC: 6.3 10*3/uL (ref 4.0–10.5)

## 2014-02-09 LAB — BASIC METABOLIC PANEL
ANION GAP: 14 (ref 5–15)
BUN: 23 mg/dL (ref 6–23)
CHLORIDE: 100 meq/L (ref 96–112)
CO2: 23 meq/L (ref 19–32)
Calcium: 8.9 mg/dL (ref 8.4–10.5)
Creatinine, Ser: 1.2 mg/dL (ref 0.50–1.35)
GFR calc non Af Amer: 53 mL/min — ABNORMAL LOW (ref >90)
GFR, EST AFRICAN AMERICAN: 62 mL/min — AB (ref >90)
Glucose, Bld: 92 mg/dL (ref 70–99)
Potassium: 4.3 mEq/L (ref 3.7–5.3)
Sodium: 137 mEq/L (ref 137–147)

## 2014-02-09 LAB — TROPONIN I

## 2014-02-09 LAB — PROTIME-INR
INR: 1.04 (ref 0.00–1.49)
Prothrombin Time: 13.6 seconds (ref 11.6–15.2)

## 2014-02-09 LAB — APTT: aPTT: 30 seconds (ref 24–37)

## 2014-02-09 LAB — LIPASE, BLOOD: Lipase: 32 U/L (ref 11–59)

## 2014-02-09 LAB — OCCULT BLOOD X 1 CARD TO LAB, STOOL: Fecal Occult Bld: POSITIVE — AB

## 2014-02-09 NOTE — ED Provider Notes (Signed)
CSN: [id number]     Arrival date & time 02/09/14  1900 History   First MD Initiated Contact with Patient 02/09/14 1921     Chief Complaint  Patient presents with  . Chest Pain   HPI  Patient is a 80 year old male who presents emergency room with 2 weeks of melanotic stools and this pain. Patient has a past medical history of myocardial infarction with 3 stents, mitral regurg, GERD, diverticulitis, GI bleeds from diverticula, and prostatic cancer. Patient states that over the past 2 weeks he has been having melanotic diarrhea. Patient states that he has had rectal bleeding in the past. He follows with Dr. Cristina Gong for GI bleeding. Patient states that he is recently started on new medications for his prostatic cancer which is followed by Dr. Minus Liberty from [facility]. He feels that all of his symptoms started at that time. Patient states that he is currently taking oral chemotherapy medications. He states that he has had worsening shortness of breath, fatigue, and intermittent abdominal pain. He states that he occasionally gets right-sided chest pains which feel like heartburn and go away. He has been taking antacids which helped to relieve his pain. Patient feels that he has had some trace swelling of his legs.  Past Medical History  Diagnosis Date  . Myocardial infarction 1980's- medical intervention  . PAF (paroxysmal atrial fibrillation)     controlled w/ amiodarone; not on coumadin due to hx of GI bleed  . Sleep apnea     non-compliant cpap  . Mitral regurgitation   . GERD (gastroesophageal reflux disease)     occ. take prevacid  . Meningioma right -sided w/ right VI palsy    followed by dr Gaynell Face  . Diverticulitis of colon with bleeding     s/p sigmoid resection '88  . History of GI diverticular bleed april 2012    transfused blood and resolved without surgical intervention  . Impotence, organic     s/p penile prosthesis 1990's  . DJD (degenerative joint disease) hips and  knees    s/p bilateral total replacements  . DDD (degenerative disc disease)     chronic back pain  . BPH (benign prostatic hypertrophy) with urinary obstruction     s/p turp yrs ago  . Blood transfusion   . Impaired hearing bilateral     only left hearing aid  . Bradycardia     Amio d/c'd 08/2013; brady arrest 08/2013 after PCI >>> recurrent AF >>> Amiodarone restarted (may need BiV pacer if EF does not recover; consider Holter if + sx's)  . Cardiomyopathy     a. Echo (08/2013):  EF 30-35%, AS hypokinesis, Gr 1 diast dysfn, mild MR, mild LAE >>> b. improved EF 50-55% by echo 8/15  . CAD (coronary artery disease)     a. s/p MI and prior PCI of LAD;  b. LHC (08/2013):  prox LAD 60-70%, mid LAD stents ok, ostial lesion at Dx jailed by stent, mild CFX and RCA disesase >>>  PCI (09/08/13):  rotational atherectomy + Promus DES to prox LAD  . Mitral valve disorders   . Hx of echocardiogram     Echo (8/15):  Mod LVH, EF 50-55%, Gr 1 DD, mild MR, mild RAE  . Prostate cancer 11/30/13    Gleason 8, volume 22.14 cc  . Atrial fibrillation   . Heart murmur     with mitral valve regurgitation  . Anxiety   . Depression    Past Surgical History  Procedure  Laterality Date  . Cholecystectomy    . Joint replacement  03-25-08--total right hip     s/ revision's x2 and sev. closed reduction for dislocation last one 06-20-09  . Joint replacement  2005    total left hip  . Joint replacement  2004    total right knee and revision  . Joint replacement  1997    total left knee  . Hernia repair      bilateral inguinal hernia repair  . Cataract extraction w/ intraocular lens  implant, bilateral    . Appendectomy    . Transurethral resection of prostate  yrs ago  . Penile prosthesis implant  1990's  . Rotator cuff repair  left  . Right ear surgery  yrs ago  . Cardiac catheterization  2007    noncritical cad (results w/ chart)  . Coronary angioplasty with stent placement  08-03-08    drug-eluting stent x2  distal and mid lad  . Transurethral resection of prostate  01/08/2011    Procedure: TRANSURETHRAL RESECTION OF THE PROSTATE (TURP);  Surgeon: Franchot Gallo;  Location: [facility];  Service: Urology;  Laterality: N/A;  GYRUS OWER  . Prostate biopsy  11/30/13    Gleason 8, vol 22.14 cc  . Gamma knife radiation  2000    [facility] for meningioma, last eval 2013- no change  . Left heart catheterization with coronary angiogram N/A 09/04/2013    Procedure: LEFT HEART CATHETERIZATION WITH CORONARY ANGIOGRAM;  Surgeon: Jettie Booze, MD;  Location: [facility] CATH LAB;  Service: Cardiovascular;  Laterality: N/A;  . Percutaneous coronary rotoblator intervention (pci-r) N/A 09/08/2013    Procedure: PERCUTANEOUS CORONARY ROTOBLATOR INTERVENTION (PCI-R);  Surgeon: Jettie Booze, MD;  Location: [facility] CATH LAB;  Service: Cardiovascular;  Laterality: N/A;   Family History  Problem Relation Age of Onset  . Heart disease Mother   . Heart attack Mother   . Cancer    . Emphysema Father   . Cancer Brother     liver cancer   History  Substance Use Topics  . Smoking status: Former Smoker    Quit date: 01/05/1959  . Smokeless tobacco: Never Used  . Alcohol Use: No    Review of Systems  Constitutional: Positive for fatigue. Negative for fever and chills.  Respiratory: Positive for shortness of breath. Negative for cough and chest tightness.   Cardiovascular: Positive for chest pain and leg swelling. Negative for palpitations.  Gastrointestinal: Positive for abdominal pain. Negative for nausea, vomiting, diarrhea, constipation, blood in stool, abdominal distention and anal bleeding.  Genitourinary: Negative for dysuria, urgency, frequency, hematuria and difficulty urinating.  All other systems reviewed and are negative.     Allergies  Codeine and Warfarin sodium  Home Medications   Prior to Admission medications   Medication Sig Start Date End Date Taking? Authorizing Provider   amiodarone (PACERONE) 100 MG tablet Take 1 tablet (100 mg total) by mouth daily. 09/10/13   Almyra Deforest, PA  clopidogrel (PLAVIX) 75 MG tablet Take 75 mg by mouth daily.    Historical Provider, MD  diazepam (VALIUM) 5 MG tablet Take 5 mg by mouth at bedtime as needed for anxiety.    Historical Provider, MD  esomeprazole (NEXIUM) 20 MG capsule Take 20 mg by mouth daily.    Historical Provider, MD  finasteride (PROSCAR) 5 MG tablet Take 5 mg by mouth daily. 12/08/13   Historical Provider, MD  losartan (COZAAR) 50 MG tablet Take 25 mg by mouth daily. 1/2  tablet daily or as directed 07/10/13   Jettie Booze, MD  Multiple Vitamin (MULTIVITAMIN) tablet Take 1 tablet by mouth daily.      Historical Provider, MD  nitroGLYCERIN (NITROSTAT) 0.4 MG SL tablet Place 1 tablet (0.4 mg total) under the tongue every 5 (five) minutes as needed for chest pain. 09/21/13   Liliane Shi, PA-C  Omega-3 Fatty Acids (FISH OIL) 1000 MG CAPS Take 1 capsule by mouth daily.    Historical Provider, MD  OVER THE COUNTER MEDICATION Take 1 tablet by mouth 2 (two) times daily. Vitamins for eyes    Historical Provider, MD  OVER THE COUNTER MEDICATION Take 2 tablets by mouth daily. [facility] brand of Prostrate health    Historical Provider, MD  psyllium (METAMUCIL) 58.6 % packet Take 1 packet by mouth daily as needed (for constipation).    Historical Provider, MD  Saw Palmetto, Serenoa repens, (SAW PALMETTO PO) Take by mouth.    Historical Provider, MD  silodosin (RAPAFLO) 8 MG CAPS capsule Take 8 mg by mouth daily with breakfast. 12/08/13   Historical Provider, MD  sodium chloride (OCEAN) 0.65 % nasal spray Place 1 spray into the nose as needed for congestion.    Historical Provider, MD   BP 167/72 mmHg  Pulse 49  Temp(Src) 98.1 F (36.7 C) (Oral)  Resp 17  Ht 5\' 7"  (1.702 m)  Wt 180 lb (81.647 kg)  BMI 28.19 kg/m2  SpO2 99% Physical Exam  Constitutional: He is oriented to person, place, and time. He appears  well-developed and well-nourished. No distress.  HENT:  Head: Normocephalic and atraumatic.  Mouth/Throat: Oropharynx is clear and moist. No oropharyngeal exudate.  Eyes: Conjunctivae and EOM are normal. Pupils are equal, round, and reactive to light. No scleral icterus.  Neck: Normal range of motion. Neck supple. No JVD present. No thyromegaly present.  Cardiovascular: Normal rate, regular rhythm, normal heart sounds and intact distal pulses.  Exam reveals no gallop and no friction rub.   No murmur heard. Pulmonary/Chest: Effort normal and breath sounds normal. No respiratory distress. He has no wheezes. He has no rales. He exhibits no tenderness.  Abdominal: Soft. Normal appearance and bowel sounds are normal. He exhibits no distension and no mass. There is tenderness in the right lower quadrant, suprapubic area and left lower quadrant. There is no rebound and no guarding.  Musculoskeletal: Normal range of motion.  Lymphadenopathy:    He has no cervical adenopathy.  Neurological: He is alert and oriented to person, place, and time.  Skin: Skin is warm and dry. He is not diaphoretic.  Psychiatric: He has a normal mood and affect. His behavior is normal. Judgment and thought content normal.  Nursing note and vitals reviewed.   ED Course  Procedures (including critical care time) Labs Review Labs Reviewed  CBC - Abnormal; Notable for the following:    RBC 3.72 (*)    Hemoglobin 11.9 (*)    HCT 34.0 (*)    Platelets 148 (*)    All other components within normal limits  BASIC METABOLIC PANEL - Abnormal; Notable for the following:    GFR calc non Af Amer 53 (*)    GFR calc Af Amer 62 (*)    All other components within normal limits  OCCULT BLOOD X 1 CARD TO LAB, STOOL - Abnormal; Notable for the following:    Fecal Occult Bld POSITIVE (*)    All other components within normal limits  TROPONIN I  APTT  PROTIME-INR  LIPASE, BLOOD    Imaging Review Dg Chest 2 View  02/09/2014    CLINICAL DATA:  Right-sided chest pain with radicular type symptoms in both upper extremities. History of prostate carcinoma. History of atrial fibrillation  EXAM: CHEST  2 VIEW  COMPARISON:  November 12, 2013  FINDINGS: There is no edema or consolidation. The heart size and pulmonary vascularity are normal. No adenopathy. There is extensive atherosclerotic change throughout the aorta. No bone lesions are appreciable. No adenopathy.  IMPRESSION: Extensive atherosclerotic change.  No edema or consolidation.   Electronically Signed   By: Lowella Grip M.D.   On: 02/09/2014 20:37     EKG Interpretation   Date/Time:  Tuesday February 09 2014 19:10:04 EST Ventricular Rate:  50 PR Interval:  248 QRS Duration: 140 QT Interval:  500 QTC Calculation: 455 R Axis:   41 Text Interpretation:  Sinus bradycardia with 1st degree A-V block with  Premature atrial complexes with Abberant conduction Left bundle branch  block Abnormal ECG Since last tracing, new 1st deg AVB, rate slower.   Confirmed by DOCHERTY  MD, Kirvin [phone]) on 02/09/2014 8:52:31 PM      MDM   Final diagnoses:  Chest pain  Rectal bleeding  Prostate cancer  Coronary artery disease involving native coronary artery of native heart without angina pectoris    Patient is an 80 year old male who presents emergency room for evaluation of chest pain and melanotic stools. Physical exam reveals an alert nontoxic-appearing male with minimal abdominal tenderness and no frank blood on rectal exam. Stool was melanotic. EKG reveals some new first degree AV block changes. Troponin is negative. Hemoglobin is stable on CBC. Hemoccult is positive. PT/INR lipase, and APTT are negative. Given changes in EKG, worsening shortness of breath, and rectal bleeding will admit to the hospitalist. I spoke with Dr. Humphrey Rolls who will admit the patient to telemetry at [facility]. EMTALA has been completed. Patient to be transferred to [facility]. I have discussed this patient  and the patient has been seen by Dr. Tawnya Crook who agrees with the above workup and plan.   Cherylann Parr, PA-C 02/09/14 2203  Ernestina Patches, MD 02/11/14 [phone]

## 2014-02-09 NOTE — ED Notes (Signed)
Pt c/o right side chest pain also c/o black tary stools x 2 weeks

## 2014-02-09 NOTE — Progress Notes (Signed)
Admitted pt from [facility].AAOx4.oriented to room and call bell.VS taken and recorded.tele on.

## 2014-02-10 ENCOUNTER — Encounter (HOSPITAL_COMMUNITY): Payer: Self-pay | Admitting: General Practice

## 2014-02-10 DIAGNOSIS — I5022 Chronic systolic (congestive) heart failure: Secondary | ICD-10-CM

## 2014-02-10 DIAGNOSIS — R079 Chest pain, unspecified: Secondary | ICD-10-CM

## 2014-02-10 DIAGNOSIS — I10 Essential (primary) hypertension: Secondary | ICD-10-CM | POA: Diagnosis not present

## 2014-02-10 DIAGNOSIS — I2510 Atherosclerotic heart disease of native coronary artery without angina pectoris: Secondary | ICD-10-CM | POA: Insufficient documentation

## 2014-02-10 DIAGNOSIS — K2960 Other gastritis without bleeding: Secondary | ICD-10-CM | POA: Diagnosis present

## 2014-02-10 DIAGNOSIS — Z961 Presence of intraocular lens: Secondary | ICD-10-CM | POA: Diagnosis present

## 2014-02-10 DIAGNOSIS — K625 Hemorrhage of anus and rectum: Secondary | ICD-10-CM | POA: Diagnosis present

## 2014-02-10 DIAGNOSIS — Z9842 Cataract extraction status, left eye: Secondary | ICD-10-CM | POA: Diagnosis not present

## 2014-02-10 DIAGNOSIS — Z9119 Patient's noncompliance with other medical treatment and regimen: Secondary | ICD-10-CM | POA: Diagnosis present

## 2014-02-10 DIAGNOSIS — Z96653 Presence of artificial knee joint, bilateral: Secondary | ICD-10-CM | POA: Diagnosis present

## 2014-02-10 DIAGNOSIS — Z923 Personal history of irradiation: Secondary | ICD-10-CM | POA: Diagnosis not present

## 2014-02-10 DIAGNOSIS — F419 Anxiety disorder, unspecified: Secondary | ICD-10-CM | POA: Diagnosis present

## 2014-02-10 DIAGNOSIS — I447 Left bundle-branch block, unspecified: Secondary | ICD-10-CM | POA: Diagnosis present

## 2014-02-10 DIAGNOSIS — F329 Major depressive disorder, single episode, unspecified: Secondary | ICD-10-CM | POA: Diagnosis present

## 2014-02-10 DIAGNOSIS — K921 Melena: Secondary | ICD-10-CM | POA: Diagnosis not present

## 2014-02-10 DIAGNOSIS — I252 Old myocardial infarction: Secondary | ICD-10-CM | POA: Diagnosis not present

## 2014-02-10 DIAGNOSIS — I480 Paroxysmal atrial fibrillation: Secondary | ICD-10-CM | POA: Diagnosis not present

## 2014-02-10 DIAGNOSIS — G8929 Other chronic pain: Secondary | ICD-10-CM | POA: Diagnosis present

## 2014-02-10 DIAGNOSIS — C61 Malignant neoplasm of prostate: Secondary | ICD-10-CM | POA: Insufficient documentation

## 2014-02-10 DIAGNOSIS — N400 Enlarged prostate without lower urinary tract symptoms: Secondary | ICD-10-CM | POA: Diagnosis present

## 2014-02-10 DIAGNOSIS — K449 Diaphragmatic hernia without obstruction or gangrene: Secondary | ICD-10-CM | POA: Diagnosis present

## 2014-02-10 DIAGNOSIS — Z87891 Personal history of nicotine dependence: Secondary | ICD-10-CM | POA: Diagnosis not present

## 2014-02-10 DIAGNOSIS — K922 Gastrointestinal hemorrhage, unspecified: Secondary | ICD-10-CM

## 2014-02-10 DIAGNOSIS — Z9079 Acquired absence of other genital organ(s): Secondary | ICD-10-CM | POA: Diagnosis present

## 2014-02-10 DIAGNOSIS — I340 Nonrheumatic mitral (valve) insufficiency: Secondary | ICD-10-CM | POA: Diagnosis present

## 2014-02-10 DIAGNOSIS — D649 Anemia, unspecified: Secondary | ICD-10-CM | POA: Diagnosis not present

## 2014-02-10 DIAGNOSIS — Z96643 Presence of artificial hip joint, bilateral: Secondary | ICD-10-CM | POA: Diagnosis present

## 2014-02-10 DIAGNOSIS — K222 Esophageal obstruction: Secondary | ICD-10-CM | POA: Diagnosis not present

## 2014-02-10 DIAGNOSIS — Z9841 Cataract extraction status, right eye: Secondary | ICD-10-CM | POA: Diagnosis not present

## 2014-02-10 DIAGNOSIS — K219 Gastro-esophageal reflux disease without esophagitis: Secondary | ICD-10-CM | POA: Diagnosis present

## 2014-02-10 DIAGNOSIS — G4730 Sleep apnea, unspecified: Secondary | ICD-10-CM | POA: Diagnosis present

## 2014-02-10 DIAGNOSIS — Z888 Allergy status to other drugs, medicaments and biological substances status: Secondary | ICD-10-CM | POA: Diagnosis not present

## 2014-02-10 DIAGNOSIS — R001 Bradycardia, unspecified: Secondary | ICD-10-CM | POA: Diagnosis present

## 2014-02-10 LAB — COMPREHENSIVE METABOLIC PANEL
ALBUMIN: 3.5 g/dL (ref 3.5–5.2)
ALT: 21 U/L (ref 0–53)
AST: 23 U/L (ref 0–37)
Alkaline Phosphatase: 69 U/L (ref 39–117)
Anion gap: 12 (ref 5–15)
BUN: 18 mg/dL (ref 6–23)
CO2: 23 mEq/L (ref 19–32)
Calcium: 8.9 mg/dL (ref 8.4–10.5)
Chloride: 103 mEq/L (ref 96–112)
Creatinine, Ser: 1.02 mg/dL (ref 0.50–1.35)
GFR calc non Af Amer: 65 mL/min — ABNORMAL LOW (ref >90)
GFR, EST AFRICAN AMERICAN: 75 mL/min — AB (ref >90)
Glucose, Bld: 87 mg/dL (ref 70–99)
Potassium: 3.9 mEq/L (ref 3.7–5.3)
Sodium: 138 mEq/L (ref 137–147)
TOTAL PROTEIN: 5.7 g/dL — AB (ref 6.0–8.3)
Total Bilirubin: 0.9 mg/dL (ref 0.3–1.2)

## 2014-02-10 LAB — CBC
HEMATOCRIT: 33.4 % — AB (ref 39.0–52.0)
Hemoglobin: 11.4 g/dL — ABNORMAL LOW (ref 13.0–17.0)
MCH: 30.8 pg (ref 26.0–34.0)
MCHC: 34.1 g/dL (ref 30.0–36.0)
MCV: 90.3 fL (ref 78.0–100.0)
Platelets: 145 10*3/uL — ABNORMAL LOW (ref 150–400)
RBC: 3.7 MIL/uL — ABNORMAL LOW (ref 4.22–5.81)
RDW: 15.4 % (ref 11.5–15.5)
WBC: 4.5 10*3/uL (ref 4.0–10.5)

## 2014-02-10 LAB — PROTIME-INR
INR: 1.17 (ref 0.00–1.49)
PROTHROMBIN TIME: 15 s (ref 11.6–15.2)

## 2014-02-10 LAB — GLUCOSE, CAPILLARY: GLUCOSE-CAPILLARY: 96 mg/dL (ref 70–99)

## 2014-02-10 LAB — TSH: TSH: 2.59 u[IU]/mL (ref 0.350–4.500)

## 2014-02-10 LAB — TROPONIN I: Troponin I: <0.3 ng/mL (ref <0.30)

## 2014-02-10 LAB — HEMOGLOBIN A1C
Hgb A1c MFr Bld: 5.4 % (ref <5.7)
Mean Plasma Glucose: 108 mg/dL (ref <117)

## 2014-02-10 LAB — APTT: aPTT: 31 seconds (ref 24–37)

## 2014-02-10 MED ORDER — ONDANSETRON HCL 4 MG/2ML IJ SOLN: 4.0000 mg | Freq: Four times a day (QID) | INTRAMUSCULAR | Status: DC | PRN

## 2014-02-10 MED ORDER — PANTOPRAZOLE SODIUM 40 MG PO TBEC
40.0000 mg | DELAYED_RELEASE_TABLET | Freq: Every day | ORAL | Status: DC
  Filled 2014-02-10: qty 1

## 2014-02-10 MED ORDER — LOSARTAN POTASSIUM 25 MG PO TABS
25.0000 mg | ORAL_TABLET | Freq: Every day | ORAL | Status: DC
  Administered 2014-02-10 – 2014-02-11 (×2): 25 mg via ORAL
  Filled 2014-02-10 (×2): qty 1

## 2014-02-10 MED ORDER — PANTOPRAZOLE SODIUM 40 MG IV SOLR
40.0000 mg | Freq: Every morning | INTRAVENOUS | Status: DC
  Filled 2014-02-10: qty 40

## 2014-02-10 MED ORDER — FOLIC ACID 1 MG PO TABS
1.0000 mg | ORAL_TABLET | Freq: Every day | ORAL | Status: DC
  Administered 2014-02-10 – 2014-02-11 (×2): 1 mg via ORAL
  Filled 2014-02-10 (×2): qty 1

## 2014-02-10 MED ORDER — BOOST / RESOURCE BREEZE PO LIQD
1.0000 | Freq: Three times a day (TID) | ORAL | Status: DC
  Administered 2014-02-11: 1 via ORAL

## 2014-02-10 MED ORDER — ONDANSETRON HCL 4 MG PO TABS: 4.0000 mg | ORAL_TABLET | Freq: Four times a day (QID) | ORAL | Status: DC | PRN

## 2014-02-10 MED ORDER — DIAZEPAM 5 MG PO TABS
5.0000 mg | ORAL_TABLET | Freq: Every evening | ORAL | Status: DC | PRN
  Administered 2014-02-10: 5 mg via ORAL
  Filled 2014-02-10: qty 1

## 2014-02-10 MED ORDER — SODIUM CHLORIDE 0.9 % IV SOLN
INTRAVENOUS | Status: DC
  Administered 2014-02-10 – 2014-02-11 (×2): via INTRAVENOUS

## 2014-02-10 MED ORDER — TAMSULOSIN HCL 0.4 MG PO CAPS: 0.4000 mg | ORAL_CAPSULE | Freq: Every day | ORAL | Status: DC

## 2014-02-10 MED ORDER — NITROGLYCERIN 0.4 MG SL SUBL: 0.4000 mg | SUBLINGUAL_TABLET | SUBLINGUAL | Status: DC | PRN

## 2014-02-10 MED ORDER — ALBUTEROL SULFATE (2.5 MG/3ML) 0.083% IN NEBU: 2.5000 mg | INHALATION_SOLUTION | Freq: Four times a day (QID) | RESPIRATORY_TRACT | Status: DC | PRN

## 2014-02-10 MED ORDER — SODIUM CHLORIDE 0.9 % IJ SOLN
3.0000 mL | Freq: Two times a day (BID) | INTRAMUSCULAR | Status: DC
  Administered 2014-02-10 – 2014-02-11 (×3): 3 mL via INTRAVENOUS

## 2014-02-10 MED ORDER — OMEGA-3-ACID ETHYL ESTERS 1 G PO CAPS
1.0000 g | ORAL_CAPSULE | Freq: Every day | ORAL | Status: DC
  Administered 2014-02-10 – 2014-02-11 (×2): 1 g via ORAL
  Filled 2014-02-10 (×2): qty 1

## 2014-02-10 MED ORDER — ACETAMINOPHEN 650 MG RE SUPP: 650.0000 mg | Freq: Four times a day (QID) | RECTAL | Status: DC | PRN

## 2014-02-10 MED ORDER — AMIODARONE HCL 100 MG PO TABS
100.0000 mg | ORAL_TABLET | Freq: Every day | ORAL | Status: DC
  Administered 2014-02-10 – 2014-02-11 (×2): 100 mg via ORAL
  Filled 2014-02-10 (×2): qty 1

## 2014-02-10 MED ORDER — DORZOLAMIDE HCL-TIMOLOL MAL 2-0.5 % OP SOLN
1.0000 [drp] | Freq: Two times a day (BID) | OPHTHALMIC | Status: DC
  Administered 2014-02-10 – 2014-02-11 (×3): 1 [drp] via OPHTHALMIC
  Filled 2014-02-10: qty 10

## 2014-02-10 MED ORDER — VITAMIN B-1 100 MG PO TABS
100.0000 mg | ORAL_TABLET | Freq: Every day | ORAL | Status: DC
  Administered 2014-02-10 – 2014-02-11 (×2): 100 mg via ORAL
  Filled 2014-02-10 (×2): qty 1

## 2014-02-10 MED ORDER — SODIUM CHLORIDE 0.9 % IV SOLN
INTRAVENOUS | Status: DC
  Administered 2014-02-10: 02:00:00 via INTRAVENOUS

## 2014-02-10 MED ORDER — FLUTICASONE PROPIONATE 50 MCG/ACT NA SUSP
2.0000 | Freq: Every day | NASAL | Status: DC
  Administered 2014-02-10 – 2014-02-11 (×2): 2 via NASAL
  Filled 2014-02-10: qty 16

## 2014-02-10 MED ORDER — IPRATROPIUM BROMIDE 0.02 % IN SOLN: 0.5000 mg | Freq: Four times a day (QID) | RESPIRATORY_TRACT | Status: DC | PRN

## 2014-02-10 MED ORDER — SAW PALMETTO 80 MG PO CAPS: ORAL_CAPSULE | Freq: Every day | ORAL | Status: DC

## 2014-02-10 MED ORDER — PANTOPRAZOLE SODIUM 40 MG IV SOLR
40.0000 mg | Freq: Two times a day (BID) | INTRAVENOUS | Status: DC
  Administered 2014-02-10 – 2014-02-11 (×3): 40 mg via INTRAVENOUS
  Filled 2014-02-10 (×4): qty 40

## 2014-02-10 MED ORDER — DOCUSATE SODIUM 100 MG PO CAPS
100.0000 mg | ORAL_CAPSULE | Freq: Two times a day (BID) | ORAL | Status: DC
  Administered 2014-02-10 – 2014-02-11 (×4): 100 mg via ORAL
  Filled 2014-02-10 (×5): qty 1

## 2014-02-10 MED ORDER — ACETAMINOPHEN 325 MG PO TABS
650.0000 mg | ORAL_TABLET | Freq: Four times a day (QID) | ORAL | Status: DC | PRN
  Administered 2014-02-10: 650 mg via ORAL
  Filled 2014-02-10 (×2): qty 2

## 2014-02-10 MED ORDER — PSYLLIUM 95 % PO PACK
1.0000 | PACK | Freq: Two times a day (BID) | ORAL | Status: DC
  Administered 2014-02-10 – 2014-02-11 (×2): 1 via ORAL
  Filled 2014-02-10 (×4): qty 1

## 2014-02-10 MED ORDER — FINASTERIDE 5 MG PO TABS
5.0000 mg | ORAL_TABLET | Freq: Every day | ORAL | Status: DC
  Administered 2014-02-10 – 2014-02-11 (×2): 5 mg via ORAL
  Filled 2014-02-10 (×2): qty 1

## 2014-02-10 MED ORDER — ZOLPIDEM TARTRATE 5 MG PO TABS: 5.0000 mg | ORAL_TABLET | Freq: Every evening | ORAL | Status: DC | PRN

## 2014-02-10 MED ORDER — TAMSULOSIN HCL 0.4 MG PO CAPS
0.4000 mg | ORAL_CAPSULE | Freq: Every day | ORAL | Status: DC
  Administered 2014-02-10 – 2014-02-11 (×2): 0.4 mg via ORAL
  Filled 2014-02-10 (×2): qty 1

## 2014-02-10 MED ORDER — ADULT MULTIVITAMIN W/MINERALS CH
1.0000 | ORAL_TABLET | Freq: Every day | ORAL | Status: DC
  Administered 2014-02-10 – 2014-02-11 (×2): 1 via ORAL
  Filled 2014-02-10 (×2): qty 1

## 2014-02-10 NOTE — Progress Notes (Signed)
Patient ID: [name]  male  [id number]    DOB: [birth year]    DOA: 02/09/2014  PCP: Cleotis Nipper, MD  Brief history of present illness  Patient is a 80 year old male with mitral regurgitation, proximal atrial fibrillation, prior history of GI bleed, BPH, GERD presented with black tarry stools for 3 weeks, associated with bloating, flatulence, acid reflux, midsternal chest pain. No frank BRBPR.   Assessment/Plan: Principal Problem:  Upper GI bleed - Hold plavix, also taking Naprosyn, which is held - Patient has history of atrial fibrillation, not on anticoagulation due to prior history of GI bleed on Coumadin. - placed on IV PPI, clears, GI consult called, d/w Dr Cristina Gong   Active Problems:   Essential hypertension, benign - Currently stable    Atrial fibrillation with bradycardia - Amiodarone currently held    Chronic systolic heart failure - Currently still, euvolemic    Chest pain - Resolved likely due to GERD, troponins negative -EKG reviewed, LBBB not new   BPH with recent diagnosis of prostate cancer  Continue Flomax, will restart casodex at DC    DVT Prophylaxis: SCD's   Code Status: FC  Family Communication:  Disposition:  Consultants: Gastroenterology, Dr. Cristina Gong  Procedures:  none  Antibiotics:  none    Subjective: Patient seen and examined, denies any active bleeding, no chest pain, shortness of breath, fevers or chills, no nausea or vomiting  Objective: Weight change:   Intake/Output Summary (Last 24 hours) at 02/10/14 1027 Last data filed at 02/10/14 1000  Gross per 24 hour  Intake 1201.67 ml  Output    850 ml  Net 351.67 ml   Blood pressure 149/68, pulse 60, temperature 98 F (36.7 C), temperature source Oral, resp. rate 20, height 5\' 7"  (1.702 m), weight 85 kg (187 lb 6.3 oz), SpO2 95 %.  Physical Exam: General: Alert and awake, oriented x3, not in any acute distress. CVS: S1-S2 clear, no murmur rubs or  gallops Chest: clear to auscultation bilaterally, no wheezing, rales or rhonchi Abdomen: soft nontender, nondistended, normal bowel sounds  Extremities: no cyanosis, clubbing or edema noted bilaterally Neuro: Cranial nerves II-XII intact, no focal neurological deficits  Lab Results: Basic Metabolic Panel:  Recent Labs Lab 02/09/14 1934 02/10/14 0635  NA 137 138  K 4.3 3.9  CL 100 103  CO2 23 23  GLUCOSE 92 87  BUN 23 18  CREATININE 1.20 1.02  CALCIUM 8.9 8.9   Liver Function Tests:  Recent Labs Lab 02/10/14 0635  AST 23  ALT 21  ALKPHOS 69  BILITOT 0.9  PROT 5.7*  ALBUMIN 3.5    Recent Labs Lab 02/09/14 2000  LIPASE 32   No results for input(s): AMMONIA in the last 168 hours. CBC:  Recent Labs Lab 02/09/14 1934 02/10/14 0635  WBC 6.3 4.5  HGB 11.9* 11.4*  HCT 34.0* 33.4*  MCV 91.4 90.3  PLT 148* 145*   Cardiac Enzymes:  Recent Labs Lab 02/09/14 1934 02/10/14 0055 02/10/14 0635  TROPONINI <0.30 <0.30 <0.30   BNP: Invalid input(s): POCBNP CBG:  Recent Labs Lab 02/10/14 0606  GLUCAP 96     Micro Results: No results found for this or any previous visit (from the past 240 hour(s)).  Studies/Results: Dg Chest 2 View  02/09/2014   CLINICAL DATA:  Right-sided chest pain with radicular type symptoms in both upper extremities. History of prostate carcinoma. History of atrial fibrillation  EXAM: CHEST  2 VIEW  COMPARISON:  November 12, 2013  FINDINGS: There is no edema or consolidation. The heart size and pulmonary vascularity are normal. No adenopathy. There is extensive atherosclerotic change throughout the aorta. No bone lesions are appreciable. No adenopathy.  IMPRESSION: Extensive atherosclerotic change.  No edema or consolidation.   Electronically Signed   By: Lowella Grip M.D.   On: 02/09/2014 20:37    Medications: Scheduled Meds: . amiodarone  100 mg Oral Daily  . docusate sodium  100 mg Oral BID  . finasteride  5 mg Oral Daily   . folic acid  1 mg Oral Daily  . losartan  25 mg Oral Daily  . multivitamin with minerals  1 tablet Oral Daily  . omega-3 acid ethyl esters  1 g Oral Daily  . pantoprazole (PROTONIX) IV  40 mg Intravenous Q12H  . psyllium  1 packet Oral BID  . sodium chloride  3 mL Intravenous Q12H  . tamsulosin  0.4 mg Oral Daily  . thiamine  100 mg Oral Daily      LOS: 1 day   Courtnay Petrilla M.D. [facility] 02/10/2014, 10:27 AM Pager: [phone]  If 7PM-7AM, please contact night-coverage [email or web address] Password [email or web address]

## 2014-02-10 NOTE — Care Management Note (Addendum)
  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  NA - LOS <3 / Initial given by admissions (If response is "NO", the following Medicare IM given date fields will be blank) Date Medicare IM given:   Medicare IM given by:   Date Additional Medicare IM given:   Additional Medicare IM given by:    Discharge Disposition:  HOME/SELF CARE  Per UR Regulation:  Reviewed for med. necessity/level of care/duration of stay  If discussed at [place] of Stay Meetings, dates discussed:    Comments:  Azir Muzyka RN, BSN, MSHL, CCM  Nurse - Case Manager,  (Unit [place])  [phone]  02/10/2014

## 2014-02-10 NOTE — H&P (Signed)
[facility] History and Physical  [name] GQQ:[id number] DOB: May 18, 1926 DOA: 02/09/2014  Referring physician: Starlyn Skeans, PA PCP: Cleotis Nipper, MD   Chief Complaint: Black Stools  HPI: [name] is a 80 y.o. male black stools. Patient states that he has had black tarry stools. He states that this has been going for 3 weeks. He states that he has had gas and bloating. Patient states that he has had awful smelly flatulance. He states no nausea or vomting. He has not seen any frank red blood in the toilet bowl. He states that he has noted no chest pain now but he has noted indigestion on the right side. He states he has a history of a light heart attack in the past. In the ED he was tested and found to have guaic positive in his stool. He has been seen by GI in the past and has not had a recent scope done.   Review of Systems:  Constitutional:  No weight loss, night sweats, Fevers, chills, ++fatigue.  HEENT:  No headaches, Difficulty swallowing,Tooth/dental problems,Sore throat,  No sneezing Cardio-vascular:  ++chest pain, no Orthopnea, PND, ++swelling in lower extremities, anasarca, ++dizziness  GI:  No heartburn, ++indigestion, ++abdominal pain, no nausea, vomiting, ++diarrhea  Resp:  No shortness of breath with exertion or at rest. No excess mucus, no productive cough, No non-productive cough, No coughing up of blood.No wheezing  Skin:  no rash or lesions GU:  no dysuria, change in color of urine, ++urgency. No flank pain.  Musculoskeletal:  No joint pain or swelling. No decreased range of motion. No back pain.  Psych:  No change in mood or affect. No depression or anxiety. No memory loss.   Past Medical History  Diagnosis Date  . PAF (paroxysmal atrial fibrillation)     controlled w/ amiodarone; not on coumadin due to hx of GI bleed  . Mitral regurgitation   . GERD (gastroesophageal reflux disease)     occ. take prevacid  . Meningioma right  -sided w/ right VI palsy    followed by dr Gaynell Face  . Diverticulitis of colon with bleeding     s/p sigmoid resection '88  . History of GI diverticular bleed april 2012    transfused blood and resolved without surgical intervention  . Impotence, organic     s/p penile prosthesis 1990's  . BPH (benign prostatic hypertrophy) with urinary obstruction     s/p turp yrs ago  . Blood transfusion     "related to a surgery"  . Impaired hearing bilateral     only left hearing aid  . Bradycardia     Amio d/c'd 08/2013; brady arrest 08/2013 after PCI >>> recurrent AF >>> Amiodarone restarted (may need BiV pacer if EF does not recover; consider Holter if + sx's)  . Cardiomyopathy     a. Echo (08/2013):  EF 30-35%, AS hypokinesis, Gr 1 diast dysfn, mild MR, mild LAE >>> b. improved EF 50-55% by echo 8/15  . CAD (coronary artery disease)     a. s/p MI and prior PCI of LAD;  b. LHC (08/2013):  prox LAD 60-70%, mid LAD stents ok, ostial lesion at Dx jailed by stent, mild CFX and RCA disesase >>>  PCI (09/08/13):  rotational atherectomy + Promus DES to prox LAD  . Mitral valve disorders   . Hx of echocardiogram     Echo (8/15):  Mod LVH, EF 50-55%, Gr 1 DD, mild MR, mild RAE  . Prostate  cancer 11/30/13    Gleason 8, volume 22.14 cc  . Atrial fibrillation   . Heart murmur     with mitral valve regurgitation  . Anxiety   . Depression   . Hypertension   . CHF (congestive heart failure)   . Myocardial infarction 1980's- medical intervention    "so mild I didn't know I'd had it"  . Sleep apnea     non-compliant cpap  . DJD (degenerative joint disease) hips and knees    s/p bilateral total replacements  . DDD (degenerative disc disease)     chronic back pain  . Arthritis     "I'm covered up w/it"  . Chronic lower back pain   . Incomplete bladder emptying    Past Surgical History  Procedure Laterality Date  . Cholecystectomy    . Total hip arthroplasty Right 03-25-08--  . Total hip arthroplasty  Left 2005  . Total knee arthroplasty Right 2004  . Total knee arthroplasty Left 1997  . Inguinal hernia repair Bilateral   . Cataract extraction w/ intraocular lens  implant, bilateral Bilateral ~ 2000  . Appendectomy    . Transurethral resection of prostate  "years ago"  . Penile prosthesis implant  1990's  . Shoulder open rotator cuff repair Left   . Inner ear surgery Right yrs ago    "trying to get my hearing back  . Cardiac catheterization  2007    noncritical cad (results w/ chart)  . Coronary angioplasty with stent placement  08-03-08    drug-eluting stent x2 distal and mid lad  . Transurethral resection of prostate  01/08/2011    Procedure: TRANSURETHRAL RESECTION OF THE PROSTATE (TURP);  Surgeon: Franchot Gallo;  Location: [place];  Service: Urology;  Laterality: N/A;  GYRUS   . Prostate biopsy  11/30/13    Gleason 8, vol 22.14 cc  . Gamma knife radiation  2000    [facility] for meningioma, last eval 2013- no change  . Left heart catheterization with coronary angiogram N/A 09/04/2013    Procedure: LEFT HEART CATHETERIZATION WITH CORONARY ANGIOGRAM;  Surgeon: Jettie Booze, MD;  Location: [facility] C[facility] LAB;  Service: Cardiovascular;  Laterality: N/A;  . Percutaneous coronary rotoblator intervention (pci-r) N/A 09/08/2013    Procedure: PERCUTANEOUS CORONARY ROTOBLATOR INTERVENTION (PCI-R);  Surgeon: Jettie Booze, MD;  Location: [facility] CATH LAB;  Service: Cardiovascular;  Laterality: N/A;  . Revision total knee arthroplasty Right   . Total hip revision Right 3-4 times  . Closed reduction hip dislocation Right "several"   Social History:  reports that he quit smoking about 55 years ago. His smoking use included Cigarettes. He has a 14 pack-year smoking history. He has never used smokeless tobacco. He reports that he drinks alcohol. He reports that he does not use illicit drugs.  Allergies  Allergen Reactions  . Codeine Other (See Comments)    Insomnia/ hyper    . Warfarin Sodium Other (See Comments)    Hx gi bleed    Family History  Problem Relation Age of Onset  . Heart disease Mother   . Heart attack Mother   . Cancer    . Emphysema Father   . Cancer Brother     liver cancer     Prior to Admission medications   Medication Sig Start Date End Date Taking? Authorizing Provider  amiodarone (PACERONE) 100 MG tablet Take 1 tablet (100 mg total) by mouth daily. 09/10/13   Almyra Deforest, PA  clopidogrel (PLAVIX) 75  MG tablet Take 75 mg by mouth daily.    Historical Provider, MD  diazepam (VALIUM) 5 MG tablet Take 5 mg by mouth at bedtime as needed for anxiety.    Historical Provider, MD  esomeprazole (NEXIUM) 20 MG capsule Take 20 mg by mouth daily.    Historical Provider, MD  finasteride (PROSCAR) 5 MG tablet Take 5 mg by mouth daily. 12/08/13   Historical Provider, MD  losartan (COZAAR) 50 MG tablet Take 25 mg by mouth daily. 1/2 tablet daily or as directed 07/10/13   Jettie Booze, MD  Multiple Vitamin (MULTIVITAMIN) tablet Take 1 tablet by mouth daily.      Historical Provider, MD  nitroGLYCERIN (NITROSTAT) 0.4 MG SL tablet Place 1 tablet (0.4 mg total) under the tongue every 5 (five) minutes as needed for chest pain. 09/21/13   Liliane Shi, PA-C  Omega-3 Fatty Acids (FISH OIL) 1000 MG CAPS Take 1 capsule by mouth daily.    Historical Provider, MD  OVER THE COUNTER MEDICATION Take 1 tablet by mouth 2 (two) times daily. Vitamins for eyes    Historical Provider, MD  OVER THE COUNTER MEDICATION Take 2 tablets by mouth daily. [facility] brand of Prostrate health    Historical Provider, MD  psyllium (METAMUCIL) 58.6 % packet Take 1 packet by mouth daily as needed (for constipation).    Historical Provider, MD  Saw Palmetto, Serenoa repens, (SAW PALMETTO PO) Take by mouth.    Historical Provider, MD  silodosin (RAPAFLO) 8 MG CAPS capsule Take 8 mg by mouth daily with breakfast. 12/08/13   Historical Provider, MD  sodium chloride (OCEAN) 0.65 % nasal  spray Place 1 spray into the nose as needed for congestion.    Historical Provider, MD   Physical Exam: Filed Vitals:   02/09/14 2000 02/09/14 2130 02/09/14 2234 02/09/14 2324  BP: 161/81 167/72 173/73 164/68  Pulse: 52 49 52 56  Temp:    98 F (36.7 C)  TempSrc:    Oral  Resp: 15 17 20 20   Height:    5\' 7"  (1.702 m)  Weight:    83.3 kg (183 lb 10.3 oz)  SpO2: 100% 99% 98% 98%    Wt Readings from Last 3 Encounters:  02/09/14 83.3 kg (183 lb 10.3 oz)  12/15/13 84.46 kg (186 lb 3.2 oz)  11/16/13 84.369 kg (186 lb)    General:  Appears calm and comfortable Eyes: PERRL, normal lids, irises & conjunctiva ENT: grossly normal hearing, lips & tongue Neck: no LAD, masses or thyromegaly Cardiovascular: RRR, no m/r/g. No LE edema. Telemetry: SR, no arrhythmias  Respiratory: CTA bilaterally, no w/r/r. Normal respiratory effort. Abdomen: soft, ntnd Skin: no rash or induration seen on limited exam Musculoskeletal: grossly normal tone BUE/BLE Psychiatric: grossly normal mood and affect, speech fluent and appropriate Neurologic: grossly non-focal.          Labs on Admission:  Basic Metabolic Panel:  Recent Labs Lab 02/09/14 1934  NA 137  K 4.3  CL 100  CO2 23  GLUCOSE 92  BUN 23  CREATININE 1.20  CALCIUM 8.9   Liver Function Tests: No results for input(s): AST, ALT, ALKPHOS, BILITOT, PROT, ALBUMIN in the last 168 hours.  Recent Labs Lab 02/09/14 2000  LIPASE 32   No results for input(s): AMMONIA in the last 168 hours. CBC:  Recent Labs Lab 02/09/14 1934  WBC 6.3  HGB 11.9*  HCT 34.0*  MCV 91.4  PLT 148*   Cardiac Enzymes:  Recent  Labs Lab 02/09/14 1934  TROPONINI <0.30    BNP (last 3 results)  Recent Labs  07/10/13 0913 09/03/13 1641  PROBNP 99.0 39.0   CBG: No results for input(s): GLUCAP in the last 168 hours.  Radiological Exams on Admission: Dg Chest 2 View  02/09/2014   CLINICAL DATA:  Right-sided chest pain with radicular type  symptoms in both upper extremities. History of prostate carcinoma. History of atrial fibrillation  EXAM: CHEST  2 VIEW  COMPARISON:  November 12, 2013  FINDINGS: There is no edema or consolidation. The heart size and pulmonary vascularity are normal. No adenopathy. There is extensive atherosclerotic change throughout the aorta. No bone lesions are appreciable. No adenopathy.  IMPRESSION: Extensive atherosclerotic change.  No edema or consolidation.   Electronically Signed   By: Lowella Grip M.D.   On: 02/09/2014 20:37     Assessment/Plan Active Problems:   Essential hypertension, benign   GI bleed   Chest pain   1. GI bleeding -will get a GI evaluation -hold NSAIDs -Will likely need endoscopy -monitor H/H   2. Chest Pain -sounds more like indigestion -has prior history of CAD -will check enzymes  3. Hypertension -will continue with home medications -monitor pressures  4. Prostate Cancer -has received oral chemo does not know what it is    Code Status: Full  (must indicate code status--if unknown or must be presumed, indicate so) DVT Prophylaxis:SCDs Family Communication: None (indicate person spoken with, if applicable, with phone number if by telephone) Disposition Plan: Home (indicate anticipated LOS)  Time spent: 91min  KHAN,SAADAT A [facility] Pager [phone]

## 2014-02-10 NOTE — Progress Notes (Signed)
Patient does not want to be disturbed tonight as he says he has not gotten sleep in 48 hours.  Discussed with patient the importance of q4h vital signs when first admitted to the unit.  Patient refuses all until the AM.  Will accept lab to draw his blood.

## 2014-02-10 NOTE — Progress Notes (Signed)
UR completed Deklyn Gibbon K. Jolana Runkles, RN, BSN, MSHL, CCM  02/10/2014 8:45 AM

## 2014-02-10 NOTE — Progress Notes (Signed)
INITIAL NUTRITION ASSESSMENT  DOCUMENTATION CODES Per approved criteria  -Not Applicable   INTERVENTION: Provide Resource Breeze TID after meals, each supplement provides 250 kcal and 9 grams of protein Diet advancement per MD RD to monitor for PO adequacy  NUTRITION DIAGNOSIS: Predicted sub optimal energy intake related to poor appetite and restricted diet as evidenced by pt's report and clear liquid diet.   Goal: Pt to meet >/= 90% of their estimated nutrition needs   Monitor:  PO intake, diet advancement, weight trend, labs  Reason for Assessment: Malnutrition Screening Tool, score of 2  79 y.o. male  Admitting Dx: GI bleed  ASSESSMENT: 80 year old male with mitral regurgitation, proximal atrial fibrillation, prior history of GI bleed, BPH, GERD presented with black tarry stools for 3 weeks, associated with bloating, flatulence, acid reflux, midsternal chest pain. No frank BRBPR.  Patient reports that he has lost 10 lbs in the past year unintentionally. He reports having a decreased appetite for the past 3 months and eating 50% less than usual during that time. Per weight history, pt's weight has been stable for the past 4 months. Pt is on a clear liquid diet and per nursing notes, pt is consuming 100% of meals. Pt asking for solid food and something to help fill him up. RD offered [employer] which pt accepted.   Pt denies making any changes to his usual diet recently. Pt reports eating mostly chicken, fish, fruits, vegetables, and rice/potatoes at home. Pt requesting diet information. RD encouraged pt to continue with general healthful diet and suggested waiting for test results before providing specific diet recommendations.  Labs reviewed.   Height: Ht Readings from Last 1 Encounters:  02/09/14 5\' 7"  (1.702 m)    Weight: Wt Readings from Last 1 Encounters:  02/10/14 187 lb 6.3 oz (85 kg)    Ideal Body Weight: 148 lbs  % Ideal Body Weight: 126%  Wt Readings  from Last 10 Encounters:  02/10/14 187 lb 6.3 oz (85 kg)  12/15/13 186 lb 3.2 oz (84.46 kg)  11/16/13 186 lb (84.369 kg)  10/12/13 186 lb (84.369 kg)  10/01/13 184 lb 11.9 oz (83.8 kg)  09/21/13 183 lb (83.008 kg)  09/17/13 180 lb (81.647 kg)  09/15/13 184 lb (83.462 kg)  09/09/13 184 lb 15.5 oz (83.9 kg)  09/05/13 197 lb 12 oz (89.7 kg)  07/04/13 190 lbs  Usual Body Weight: ~185 lbs  % Usual Body Weight: 101%  BMI:  Body mass index is 29.34 kg/(m^2).  Estimated Nutritional Needs: Kcal: 1700-1900 Protein: 85-100 grams Fluid: 1.7-1.9 L/day  Skin: WDL  Diet Order: Diet clear liquid  EDUCATION NEEDS: -No education needs identified at this time   Intake/Output Summary (Last 24 hours) at 02/10/14 1634 Last data filed at 02/10/14 1422  Gross per 24 hour  Intake 1681.67 ml  Output   1225 ml  Net 456.67 ml    Last BM: 12/15   Labs:   Recent Labs Lab 02/09/14 1934 02/10/14 0635  NA 137 138  K 4.3 3.9  CL 100 103  CO2 23 23  BUN 23 18  CREATININE 1.20 1.02  CALCIUM 8.9 8.9  GLUCOSE 92 87    CBG (last 3)   Recent Labs  02/10/14 0606  GLUCAP 96    Scheduled Meds: . amiodarone  100 mg Oral Daily  . docusate sodium  100 mg Oral BID  . dorzolamide-timolol  1 drop Both Eyes BID  . finasteride  5 mg Oral Daily  .  fluticasone  2 spray Each Nare Daily  . folic acid  1 mg Oral Daily  . losartan  25 mg Oral Daily  . multivitamin with minerals  1 tablet Oral Daily  . omega-3 acid ethyl esters  1 g Oral Daily  . pantoprazole (PROTONIX) IV  40 mg Intravenous Q12H  . psyllium  1 packet Oral BID  . sodium chloride  3 mL Intravenous Q12H  . tamsulosin  0.4 mg Oral Daily  . thiamine  100 mg Oral Daily    Continuous Infusions:   Past Medical History  Diagnosis Date  . PAF (paroxysmal atrial fibrillation)     controlled w/ amiodarone; not on coumadin due to hx of GI bleed  . Mitral regurgitation   . GERD (gastroesophageal reflux disease)     occ. take  prevacid  . Meningioma right -sided w/ right VI palsy    followed by dr Gaynell Face  . Diverticulitis of colon with bleeding     s/p sigmoid resection '88  . History of GI diverticular bleed april 2012    transfused blood and resolved without surgical intervention  . Impotence, organic     s/p penile prosthesis 1990's  . BPH (benign prostatic hypertrophy) with urinary obstruction     s/p turp yrs ago  . Blood transfusion     "related to a surgery"  . Impaired hearing bilateral     only left hearing aid  . Bradycardia     Amio d/c'd 08/2013; brady arrest 08/2013 after PCI >>> recurrent AF >>> Amiodarone restarted (may need BiV pacer if EF does not recover; consider Holter if + sx's)  . Cardiomyopathy     a. Echo (08/2013):  EF 30-35%, AS hypokinesis, Gr 1 diast dysfn, mild MR, mild LAE >>> b. improved EF 50-55% by echo 8/15  . CAD (coronary artery disease)     a. s/p MI and prior PCI of LAD;  b. LHC (08/2013):  prox LAD 60-70%, mid LAD stents ok, ostial lesion at Dx jailed by stent, mild CFX and RCA disesase >>>  PCI (09/08/13):  rotational atherectomy + Promus DES to prox LAD  . Mitral valve disorders   . Hx of echocardiogram     Echo (8/15):  Mod LVH, EF 50-55%, Gr 1 DD, mild MR, mild RAE  . Prostate cancer 11/30/13    Gleason 8, volume 22.14 cc  . Atrial fibrillation   . Heart murmur     with mitral valve regurgitation  . Anxiety   . Depression   . Hypertension   . CHF (congestive heart failure)   . Myocardial infarction 1980's- medical intervention    "so mild I didn't know I'd had it"  . Sleep apnea     non-compliant cpap  . DJD (degenerative joint disease) hips and knees    s/p bilateral total replacements  . DDD (degenerative disc disease)     chronic back pain  . Arthritis     "I'm covered up w/it"  . Chronic lower back pain   . Incomplete bladder emptying     Past Surgical History  Procedure Laterality Date  . Cholecystectomy    . Total hip arthroplasty Right  03-25-08--  . Total hip arthroplasty Left 2005  . Total knee arthroplasty Right 2004  . Total knee arthroplasty Left 1997  . Inguinal hernia repair Bilateral   . Cataract extraction w/ intraocular lens  implant, bilateral Bilateral ~ 2000  . Appendectomy    . Transurethral resection of  prostate  "years ago"  . Penile prosthesis implant  1990's  . Shoulder open rotator cuff repair Left   . Inner ear surgery Right yrs ago    "trying to get my hearing back  . Cardiac catheterization  2007    noncritical cad (results w/ chart)  . Coronary angioplasty with stent placement  08-03-08    drug-eluting stent x2 distal and mid lad  . Transurethral resection of prostate  01/08/2011    Procedure: TRANSURETHRAL RESECTION OF THE PROSTATE (TURP);  Surgeon: Franchot Gallo;  Location: [facility];  Service: Urology;  Laterality: N/A;  GYRUS   . Prostate biopsy  11/30/13    Gleason 8, vol 22.14 cc  . Gamma knife radiation  2000    [facility] for meningioma, last eval 2013- no change  . Left heart catheterization with coronary angiogram N/A 09/04/2013    Procedure: LEFT HEART CATHETERIZATION WITH CORONARY ANGIOGRAM;  Surgeon: Jettie Booze, MD;  Location: [facility] CATH LAB;  Service: Cardiovascular;  Laterality: N/A;  . Percutaneous coronary rotoblator intervention (pci-r) N/A 09/08/2013    Procedure: PERCUTANEOUS CORONARY ROTOBLATOR INTERVENTION (PCI-R);  Surgeon: Jettie Booze, MD;  Location: [facility] CATH LAB;  Service: Cardiovascular;  Laterality: N/A;  . Revision total knee arthroplasty Right   . Total hip revision Right 3-4 times  . Closed reduction hip dislocation Right "several"    Pryor Ochoa RD, LDN Inpatient Clinical Dietitian Pager: [phone] After Hours Pager: [phone]

## 2014-02-10 NOTE — Consult Note (Signed)
Referring Provider: Dr. Tana Coast ([facility]) Primary Care Physician:  Cleotis Nipper, MD Primary Gastroenterologist:  Dr. Cristina Gong and Dr. Wynetta Emery  Reason for Consultation:  GI bleeding  HPI: [name] is a 80 y.o. male admits to the emergency room yesterday evening when he presented with a several week history of nonspecific upper abdominal discomfort and a several day history of melenic stools, without a significant drop in hemoglobin or significant rise in his BUN. Of note, the patient is on aspirin and Naprosyn as an outpatient and had been off his PPI therapy for some months, despite recommendations of Dr. Wynetta Emery to resume it. Today, he had 1 bowel movement, which was apparently somewhat dark in character. There has been no frank hemodynamic instability, and his hemoglobin has remained stable in the hospital since he was admitted about 24 hours ago.  Past Medical History  Diagnosis Date  . PAF (paroxysmal atrial fibrillation)     controlled w/ amiodarone; not on coumadin due to hx of GI bleed  . Mitral regurgitation   . GERD (gastroesophageal reflux disease)     occ. take prevacid  . Meningioma right -sided w/ right VI palsy    followed by dr Gaynell Face  . Diverticulitis of colon with bleeding     s/p sigmoid resection '88  . History of GI diverticular bleed april 2012    transfused blood and resolved without surgical intervention  . Impotence, organic     s/p penile prosthesis 1990's  . BPH (benign prostatic hypertrophy) with urinary obstruction     s/p turp yrs ago  . Blood transfusion     "related to a surgery"  . Impaired hearing bilateral     only left hearing aid  . Bradycardia     Amio d/c'd 08/2013; brady arrest 08/2013 after PCI >>> recurrent AF >>> Amiodarone restarted (may need BiV pacer if EF does not recover; consider Holter if + sx's)  . Cardiomyopathy     a. Echo (08/2013):  EF 30-35%, AS hypokinesis, Gr 1 diast dysfn, mild MR, mild LAE >>> b. improved EF  50-55% by echo 8/15  . CAD (coronary artery disease)     a. s/p MI and prior PCI of LAD;  b. LHC (08/2013):  prox LAD 60-70%, mid LAD stents ok, ostial lesion at Dx jailed by stent, mild CFX and RCA disesase >>>  PCI (09/08/13):  rotational atherectomy + Promus DES to prox LAD  . Mitral valve disorders   . Hx of echocardiogram     Echo (8/15):  Mod LVH, EF 50-55%, Gr 1 DD, mild MR, mild RAE  . Prostate cancer 11/30/13    Gleason 8, volume 22.14 cc  . Atrial fibrillation   . Heart murmur     with mitral valve regurgitation  . Anxiety   . Depression   . Hypertension   . CHF (congestive heart failure)   . Myocardial infarction 1980's- medical intervention    "so mild I didn't know I'd had it"  . Sleep apnea     non-compliant cpap  . DJD (degenerative joint disease) hips and knees    s/p bilateral total replacements  . DDD (degenerative disc disease)     chronic back pain  . Arthritis     "I'm covered up w/it"  . Chronic lower back pain   . Incomplete bladder emptying     Past Surgical History  Procedure Laterality Date  . Cholecystectomy    . Total hip arthroplasty Right 03-25-08--  .  Total hip arthroplasty Left 2005  . Total knee arthroplasty Right 2004  . Total knee arthroplasty Left 1997  . Inguinal hernia repair Bilateral   . Cataract extraction w/ intraocular lens  implant, bilateral Bilateral ~ 2000  . Appendectomy    . Transurethral resection of prostate  "years ago"  . Penile prosthesis implant  1990's  . Shoulder open rotator cuff repair Left   . Inner ear surgery Right yrs ago    "trying to get my hearing back  . Cardiac catheterization  2007    noncritical cad (results w/ chart)  . Coronary angioplasty with stent placement  08-03-08    drug-eluting stent x2 distal and mid lad  . Transurethral resection of prostate  01/08/2011    Procedure: TRANSURETHRAL RESECTION OF THE PROSTATE (TURP);  Surgeon: Franchot Gallo;  Location: [facility];  Service:  Urology;  Laterality: N/A;  GYRUS   . Prostate biopsy  11/30/13    Gleason 8, vol 22.14 cc  . Gamma knife radiation  2000    [facility] for meningioma, last eval 2013- no change  . Left heart catheterization with coronary angiogram N/A 09/04/2013    Procedure: LEFT HEART CATHETERIZATION WITH CORONARY ANGIOGRAM;  Surgeon: Jettie Booze, MD;  Location: [facility] CATH LAB;  Service: Cardiovascular;  Laterality: N/A;  . Percutaneous coronary rotoblator intervention (pci-r) N/A 09/08/2013    Procedure: PERCUTANEOUS CORONARY ROTOBLATOR INTERVENTION (PCI-R);  Surgeon: Jettie Booze, MD;  Location: [facility] C[facility] LAB;  Service: Cardiovascular;  Laterality: N/A;  . Revision total knee arthroplasty Right   . Total hip revision Right 3-4 times  . Closed reduction hip dislocation Right "several"    Prior to Admission medications   Medication Sig Start Date End Date Taking? Authorizing Provider  amiodarone (PACERONE) 200 MG tablet Take 200 mg by mouth daily.  01/16/14  Yes Historical Provider, MD  bicalutamide (CASODEX) 50 MG tablet Take 50 mg by mouth daily.  12/29/13  Yes Historical Provider, MD  clopidogrel (PLAVIX) 75 MG tablet Take 75 mg by mouth daily.   Yes Historical Provider, MD  diazepam (VALIUM) 5 MG tablet Take 5 mg by mouth at bedtime as needed for anxiety or sedation.    Yes Historical Provider, MD  dorzolamide-timolol (COSOPT) 22.3-6.8 MG/ML ophthalmic solution Place 1 drop into both eyes 2 (two) times daily.  11/12/13  Yes Historical Provider, MD  finasteride (PROSCAR) 5 MG tablet Take 5 mg by mouth daily. 12/08/13  Yes Historical Provider, MD  fluticasone (FLONASE) 50 MCG/ACT nasal spray Place 2 sprays into both nostrils daily.  12/08/13  Yes Historical Provider, MD  losartan (COZAAR) 50 MG tablet Take 25 mg by mouth daily. 1/2 tablet daily or as directed 07/10/13  Yes Jettie Booze, MD  Multiple Vitamin (MULTIVITAMIN) tablet Take 1 tablet by mouth daily.     Yes Historical Provider, MD   naproxen (NAPROSYN) 375 MG tablet Take 375 mg by mouth daily.  01/16/14  Yes Historical Provider, MD  nitroGLYCERIN (NITROSTAT) 0.4 MG SL tablet Place 1 tablet (0.4 mg total) under the tongue every 5 (five) minutes as needed for chest pain. 09/21/13  Yes Liliane Shi, PA-C  Omega-3 Fatty Acids (FISH OIL) 1000 MG CAPS Take 1 capsule by mouth daily.   Yes Historical Provider, MD  OVER THE COUNTER MEDICATION Take 1 tablet by mouth 2 (two) times daily. Vitamins for eyes   Yes Historical Provider, MD  OVER THE COUNTER MEDICATION Take 2 tablets by mouth daily. [facility]  brand for prostate health   Yes Historical Provider, MD  psyllium (METAMUCIL) 58.6 % packet Take 1 packet by mouth daily as needed (for constipation).   Yes Historical Provider, MD  ranitidine (ZANTAC) 150 MG tablet Take 150-300 mg by mouth 2 (two) times daily as needed for heartburn.    Yes Historical Provider, MD  Saw Palmetto, Serenoa repens, (SAW PALMETTO PO) Take 1 capsule by mouth 2 (two) times daily.    Yes Historical Provider, MD  silodosin (RAPAFLO) 8 MG CAPS capsule Take 8 mg by mouth daily with breakfast. 12/08/13  Yes Historical Provider, MD  sodium chloride (OCEAN) 0.65 % nasal spray Place 1 spray into the nose as needed for congestion.   Yes Historical Provider, MD  tamsulosin (FLOMAX) 0.4 MG CAPS capsule Take 0.4 mg by mouth daily.  01/16/14  Yes Historical Provider, MD    Current Facility-Administered Medications  Medication Dose Route Frequency Provider Last Rate Last Dose  . acetaminophen (TYLENOL) tablet 650 mg  650 mg Oral Q6H PRN Allyne Gee, MD       Or  . acetaminophen (TYLENOL) suppository 650 mg  650 mg Rectal Q6H PRN Allyne Gee, MD      . albuterol (PROVENTIL) (2.5 MG/3ML) 0.083% nebulizer solution 2.5 mg  2.5 mg Nebulization Q6H PRN Allyne Gee, MD      . amiodarone (PACERONE) tablet 100 mg  100 mg Oral Daily Allyne Gee, MD   100 mg at 02/10/14 1000  . diazepam (VALIUM) tablet 5 mg  5 mg Oral QHS  PRN Allyne Gee, MD      . docusate sodium (COLACE) capsule 100 mg  100 mg Oral BID Allyne Gee, MD   100 mg at 02/10/14 1000  . dorzolamide-timolol (COSOPT) 22.3-6.8 MG/ML ophthalmic solution 1 drop  1 drop Both Eyes BID Ripudeep K Rai, MD   1 drop at 02/10/14 1139  . feeding supplement (RESOURCE BREEZE) (RESOURCE BREEZE) liquid 1 Container  1 Container Oral TID PC Reanne [place] Pink, RD      . finasteride (PROSCAR) tablet 5 mg  5 mg Oral Daily Allyne Gee, MD   5 mg at 02/10/14 1000  . fluticasone (FLONASE) 50 MCG/ACT nasal spray 2 spray  2 spray Each Nare Daily Ripudeep Krystal Eaton, MD   2 spray at 02/10/14 1139  . folic acid (FOLVITE) tablet 1 mg  1 mg Oral Daily Allyne Gee, MD   1 mg at 02/10/14 1001  . ipratropium (ATROVENT) nebulizer solution 0.5 mg  0.5 mg Nebulization Q6H PRN Allyne Gee, MD      . losartan (COZAAR) tablet 25 mg  25 mg Oral Daily Allyne Gee, MD   25 mg at 02/10/14 1000  . multivitamin with minerals tablet 1 tablet  1 tablet Oral Daily Allyne Gee, MD   1 tablet at 02/10/14 1000  . nitroGLYCERIN (NITROSTAT) SL tablet 0.4 mg  0.4 mg Sublingual Q5 min PRN Allyne Gee, MD      . omega-3 acid ethyl esters (LOVAZA) capsule 1 g  1 g Oral Daily Allyne Gee, MD   1 g at 02/10/14 1000  . ondansetron (ZOFRAN) tablet 4 mg  4 mg Oral Q6H PRN Allyne Gee, MD       Or  . ondansetron [facility]) injection 4 mg  4 mg Intravenous Q6H PRN Allyne Gee, MD      . pantoprazole (PROTONIX) injection 40 mg  40  mg Intravenous Q12H Ripudeep Krystal Eaton, MD   40 mg at 02/10/14 1001  . psyllium (HYDROCIL/METAMUCIL) packet 1 packet  1 packet Oral BID Allyne Gee, MD   1 packet at 02/10/14 1000  . sodium chloride 0.9 % injection 3 mL  3 mL Intravenous Q12H Allyne Gee, MD   3 mL at 02/10/14 1004  . tamsulosin (FLOMAX) capsule 0.4 mg  0.4 mg Oral Daily Allyne Gee, MD   0.4 mg at 02/10/14 1001  . thiamine (VITAMIN B-1) tablet 100 mg  100 mg Oral Daily Allyne Gee, MD   100 mg at 02/10/14  1001  . zolpidem (AMBIEN) tablet 5 mg  5 mg Oral QHS PRN Allyne Gee, MD        Allergies as of 02/09/2014 - Review Complete 02/09/2014  Allergen Reaction Noted  . Codeine Other (See Comments) 01/05/2011  . Warfarin sodium Other (See Comments) 01/05/2011    Family History  Problem Relation Age of Onset  . Heart disease Mother   . Heart attack Mother   . Cancer    . Emphysema Father   . Cancer Brother     liver cancer    History   Social History  . Marital Status: Widowed    Spouse Name: N/A    Number of Children: N/A  . Years of Education: N/A   Occupational History  . Not on file.   Social History Main Topics  . Smoking status: Former Smoker -- 1.00 packs/day for 14 years    Types: Cigarettes    Quit date: 01/05/1959  . Smokeless tobacco: Never Used  . Alcohol Use: 0.0 oz/week     Comment: 02/09/2014 "I'm not drinking anymore"  . Drug Use: No  . Sexual Activity: No   Other Topics Concern  . Not on file   Social History Narrative    Review of Systems: Non-specific  Abdominal discomfort and chronic cough  Physical Exam: Vital signs in last 24 hours: Temp:  [97.9 F (36.6 C)-98.1 F (36.7 C)] 97.9 F (36.6 C) (12/16 1459) Pulse Rate:  [48-60] 52 (12/16 1459) Resp:  [14-22] 22 (12/16 1459) BP: (141-173)/(64-81) 156/74 mmHg (12/16 1459) SpO2:  [94 %-100 %] 97 % (12/16 1459) Weight:  [81.647 kg (180 lb)-85 kg (187 lb 6.3 oz)] 85 kg (187 lb 6.3 oz) (12/16 0655) Last BM Date: 02/09/14  This is a rather well-preserved gentleman in no distress, sitting up in bed, visiting with family. Chest is clear, heart is without murmur or arrhythmia at this time, M.D. abdomen has active bowel sounds but no guarding, mass effect, or tenderness. The patient is anicteric, and without pallor. The skin is warm and dry   Intake/Output from previous day: 12/15 0701 - 12/16 0700 In: 521.7 [P.O.:300; I.V.:221.7] Out: 850 [Urine:850] Intake/Output this shift: Total I/O In:  1160 [P.O.:960; I.V.:200] Out: 375 [Urine:375]  Lab Results:  Recent Labs  02/09/14 1934 02/10/14 0635  WBC 6.3 4.5  HGB 11.9* 11.4*  HCT 34.0* 33.4*  PLT 148* 145*   BMET  Recent Labs  02/09/14 1934 02/10/14 0635  NA 137 138  K 4.3 3.9  CL 100 103  CO2 23 23  GLUCOSE 92 87  BUN 23 18  CREATININE 1.20 1.02  CALCIUM 8.9 8.9   LFT  Recent Labs  02/10/14 0635  PROT 5.7*  ALBUMIN 3.5  AST 23  ALT 21  ALKPHOS 69  BILITOT 0.9   PT/INR  Recent Labs  02/09/14  1934 02/10/14 0635  LABPROT 13.6 15.0  INR 1.04 1.17    Studies/Results: Dg Chest 2 View  02/09/2014   CLINICAL DATA:  Right-sided chest pain with radicular type symptoms in both upper extremities. History of prostate carcinoma. History of atrial fibrillation  EXAM: CHEST  2 VIEW  COMPARISON:  November 12, 2013  FINDINGS: There is no edema or consolidation. The heart size and pulmonary vascularity are normal. No adenopathy. There is extensive atherosclerotic change throughout the aorta. No bone lesions are appreciable. No adenopathy.  IMPRESSION: Extensive atherosclerotic change.  No edema or consolidation.   Electronically Signed   By: Lowella Grip M.D.   On: 02/09/2014 20:37    Impression:  Subacute upper GI bleed most likely related to Naprosyn and aspirin usage, without PPI coverage  Plan:  Endoscopic evaluation tomorrow. Risks reviewed, patient agreeable. Clear liquids in the meantime. Continue PPI therapy in the meantime.   LOS: 1 day   Jamarion [place] V  02/10/2014, 5:10 PM

## 2014-02-11 ENCOUNTER — Encounter (HOSPITAL_COMMUNITY)
Admission: EM | Disposition: A | Discharge status: Home / Self Care | Payer: Self-pay | Source: Home / Self Care | Attending: Internal Medicine

## 2014-02-11 ENCOUNTER — Encounter (HOSPITAL_COMMUNITY): Payer: Self-pay

## 2014-02-11 ENCOUNTER — Ambulatory Visit: Payer: Medicare Other | Admitting: Interventional Cardiology

## 2014-02-11 DIAGNOSIS — I480 Paroxysmal atrial fibrillation: Secondary | ICD-10-CM

## 2014-02-11 HISTORY — PX: ESOPHAGOGASTRODUODENOSCOPY: SHX5428

## 2014-02-11 LAB — BASIC METABOLIC PANEL
Anion gap: 11 (ref 5–15)
BUN: 17 mg/dL (ref 6–23)
CALCIUM: 8.9 mg/dL (ref 8.4–10.5)
CO2: 24 mEq/L (ref 19–32)
CREATININE: 1.01 mg/dL (ref 0.50–1.35)
Chloride: 103 mEq/L (ref 96–112)
GFR calc non Af Amer: 66 mL/min — ABNORMAL LOW (ref >90)
GFR, EST AFRICAN AMERICAN: 76 mL/min — AB (ref >90)
Glucose, Bld: 90 mg/dL (ref 70–99)
POTASSIUM: 4.1 meq/L (ref 3.7–5.3)
Sodium: 138 mEq/L (ref 137–147)

## 2014-02-11 LAB — CBC
HEMATOCRIT: 32.7 % — AB (ref 39.0–52.0)
Hemoglobin: 11.2 g/dL — ABNORMAL LOW (ref 13.0–17.0)
MCH: 30.9 pg (ref 26.0–34.0)
MCHC: 34.3 g/dL (ref 30.0–36.0)
MCV: 90.3 fL (ref 78.0–100.0)
Platelets: 144 10*3/uL — ABNORMAL LOW (ref 150–400)
RBC: 3.62 MIL/uL — ABNORMAL LOW (ref 4.22–5.81)
RDW: 15.1 % (ref 11.5–15.5)
WBC: 5.4 10*3/uL (ref 4.0–10.5)

## 2014-02-11 SURGERY — EGD (ESOPHAGOGASTRODUODENOSCOPY)
Anesthesia: Moderate Sedation

## 2014-02-11 MED ORDER — FENTANYL CITRATE 0.05 MG/ML IJ SOLN
INTRAMUSCULAR | Status: AC
  Filled 2014-02-11: qty 2

## 2014-02-11 MED ORDER — FENTANYL CITRATE 0.05 MG/ML IJ SOLN
INTRAMUSCULAR | Status: DC | PRN
  Administered 2014-02-11: 25 ug via INTRAVENOUS

## 2014-02-11 MED ORDER — BUTAMBEN-TETRACAINE-BENZOCAINE 2-2-14 % EX AERO
INHALATION_SPRAY | CUTANEOUS | Status: DC | PRN
  Administered 2014-02-11: 1 via TOPICAL

## 2014-02-11 MED ORDER — AMIODARONE HCL 100 MG PO TABS: 100.0000 mg | ORAL_TABLET | Freq: Every day | ORAL | Status: DC

## 2014-02-11 MED ORDER — TRAMADOL HCL 50 MG PO TABS: 50.0000 mg | ORAL_TABLET | Freq: Three times a day (TID) | ORAL | Status: DC | PRN

## 2014-02-11 MED ORDER — CLOPIDOGREL BISULFATE 75 MG PO TABS: 75.0000 mg | ORAL_TABLET | Freq: Every day | ORAL | Status: DC

## 2014-02-11 MED ORDER — PANTOPRAZOLE SODIUM 40 MG PO TBEC: 40.0000 mg | DELAYED_RELEASE_TABLET | Freq: Every day | ORAL | Status: DC

## 2014-02-11 MED ORDER — MIDAZOLAM HCL 5 MG/ML IJ SOLN
INTRAMUSCULAR | Status: AC
  Filled 2014-02-11: qty 2

## 2014-02-11 MED ORDER — MIDAZOLAM HCL 10 MG/2ML IJ SOLN
INTRAMUSCULAR | Status: DC | PRN
  Administered 2014-02-11 (×2): 2 mg via INTRAVENOUS

## 2014-02-11 NOTE — Progress Notes (Signed)
Patient heart rate has been sinus bradycardia in the 40s-50s range throughout the night.  Patient states this is his "normal" heart rate. Will continue to monitor for changes

## 2014-02-11 NOTE — Op Note (Signed)
[facility] [place], [id number]   ENDOSCOPY PROCEDURE REPORT  PATIENT: [name], [name]  MR#: [id number] BIRTHDATE: [birth year] , 45  yrs. old GENDER: male ENDOSCOPIST:Waldron Gerry Canutillo, MD REFERRED BY: Dr. Earle Gell PROCEDURE DATE:  02/11/2014 PROCEDURE:   upper endoscopy ASA CLASS:    III INDICATIONS: GI bleeding (melena ), while on aspirin, Plavix, and Naprosyn but not on PPI therapy MEDICATION: fentanyl 25 g, Versed 4 mg IV TOPICAL ANESTHETIC:   Cetacaine spray  DESCRIPTION OF PROCEDURE:   After the risks and benefits of the procedure were explained, informed consent was obtained.  The Pentax Gastroscope [id number]  endoscope was introduced through the mouth  and advanced to the second portion of the duodenum .  The instrument was slowly withdrawn as the mucosa was fully examined.    inpatient at [facility].  Pentax adult scope passed under direct vision. Larynx not well seen. Esophagus normal except for small hiatal hernia with widely patent Schatzki's ring.  Stomach contained small bilious residual, no blood or coffee-ground material. Normal mucosa, no gastritis, erosions, ulcers, polyps, or masses on   careful inspection, including retroflexion viewing of the cardia.  Pylorus slightly friable but without pyloric channel ulceration visualized. Duodenal bulb and second duodenum normal. retroflexion normal.         The scope was then withdrawn from the patient and the procedure completed.the patient remained stable throughout the procedure  COMPLICATIONS: There were no immediate complications.  ENDOSCOPIC IMPRESSION: 1. No active bleeding or blood in the stomach at the time this exam 2. Surprisingly, no gastropathy despite history of melenic stools and significant aspirin/NSAID exposure. 3. Small hiatal hernia with Schatzki's ring.   RECOMMENDATIONS: 1. Will advance diet. Okay for discharge today from GI  tract standpoint 2. Would maintain patient permanently on PPI therapy in view of this episode of GI bleeding and his ongoing need for aspirin and Plavix. His GI bleeding is felt most likely to have come from an evanescent lesion, such as erosive gastritis, which had had a chance to heal in the several days since he first started having dark stools). 3. Would have the patient follow-up with his primary physician in a couple of weeks to monitor Hemoccult status and hemoglobin level   _______________________________ eSigned:  Ronald Lobo, MD 02/11/2014 8:47 AM     cc: Dr. Earle Gell  CPT CODES: ICD CODES:  The ICD and CPT codes recommended by this software are interpretations from the data that the clinical staff has captured with the software.  The verification of the translation of this report to the ICD and CPT codes and modifiers is the sole responsibility of the health care institution and practicing physician where this report was generated.  [employer] will not be held responsible for the validity of the ICD and CPT codes included on this report.  AMA assumes no liability for data contained or not contained herein. CPT is a Designer, television/film set of the [employer].  PATIENT NAME:  Miloh, Alcocer MR#: [id number]

## 2014-02-11 NOTE — Progress Notes (Signed)
Error in charting.

## 2014-02-11 NOTE — Progress Notes (Signed)
All d/c instructions explained and given to pt and [place] aide.  Verbalized understanding.   Karie Kirks, Therapist, sports.

## 2014-02-11 NOTE — Progress Notes (Signed)
No bowel movements overnight. Patient feels well and labs are stable.  Endoscopy negative for bleeding source (please see dictated report for findings and recommendations). I suppose he probably had some erosive gastritis from his aspirin and nonsteroidal anti-inflammatory drugs, which has had a chance to resolve in the several days since the bleeding appears to have occurred.  Okay for discharge from GI tract standpoint; would maintain patient on PPI therapy indefinitely.  Please call me if questions. Will sign off.  Cleotis Nipper, M.D. [phone]

## 2014-02-11 NOTE — Progress Notes (Signed)
Pt d/c off floor at 1535 via w/c to awaiting transport, by NT.  Karie Kirks, Therapist, sports.

## 2014-02-11 NOTE — Discharge Summary (Signed)
Physician Discharge Summary  Patient ID: BRITTIN JANIK MRN: [id number] DOB/AGE: 1926/07/13 80 y.o.  Admit date: 02/09/2014 Discharge date: 02/11/2014  Primary Care Physician:  Cleotis Nipper, MD  Discharge Diagnoses:   . upper GI bleed . Essential hypertension, benign . Chest pain resolved  . Chronic systolic heart failure . Atrial fibrillation  Consults:  Gastroenterology, Dr. Cristina Gong    Recommendations for Outpatient Follow-up:  Discussed in detail with Dr. Irish Lack, who recommended that patient needs to be on Plavix due to his complex recent PCI in July 2015 of LAD involving rotational arthrectomy. Patient was recommended to continue Plavix, no aspirin.  He was also strongly recommended to stop NSAIDs.  The patient was having bradycardia with heart rate in 40s, asymptomatic during hospitalization, amiodarone was decreased to 100 mg daily per cardiology/ Dr Hassell Done recommendations  TESTS THAT NEED FOLLOW-UP  CBC, BMET   DIET: heart healthy diet    Allergies:   Allergies  Allergen Reactions  . Codeine Other (See Comments)    Insomnia/ hyper  . Warfarin Sodium Other (See Comments)    Hx gi bleed     Discharge Medications:   Medication List    STOP taking these medications        naproxen 375 MG tablet  Commonly known as:  NAPROSYN      TAKE these medications        amiodarone 100 MG tablet  Commonly known as:  PACERONE  Take 1 tablet (100 mg total) by mouth daily.     bicalutamide 50 MG tablet  Commonly known as:  CASODEX  Take 50 mg by mouth daily.     clopidogrel 75 MG tablet  Commonly known as:  PLAVIX  Take 1 tablet (75 mg total) by mouth daily.  Start taking on:  02/12/2014     diazepam 5 MG tablet  Commonly known as:  VALIUM  Take 5 mg by mouth at bedtime as needed for anxiety or sedation.     dorzolamide-timolol 22.3-6.8 MG/ML ophthalmic solution  Commonly known as:  COSOPT  Place 1 drop into both eyes 2 (two) times daily.      finasteride 5 MG tablet  Commonly known as:  PROSCAR  Take 5 mg by mouth daily.     Fish Oil 1000 MG Caps  Take 1 capsule by mouth daily.     fluticasone 50 MCG/ACT nasal spray  Commonly known as:  FLONASE  Place 2 sprays into both nostrils daily.     losartan 50 MG tablet  Commonly known as:  COZAAR  Take 25 mg by mouth daily. 1/2 tablet daily or as directed     multivitamin tablet  Take 1 tablet by mouth daily.     nitroGLYCERIN 0.4 MG SL tablet  Commonly known as:  NITROSTAT  Place 1 tablet (0.4 mg total) under the tongue every 5 (five) minutes as needed for chest pain.     OVER THE COUNTER MEDICATION  Take 1 tablet by mouth 2 (two) times daily. Vitamins for eyes     OVER THE COUNTER MEDICATION  Take 2 tablets by mouth daily. [facility] brand for prostate health     pantoprazole 40 MG tablet  Commonly known as:  PROTONIX  Take 1 tablet (40 mg total) by mouth daily.     psyllium 58.6 % packet  Commonly known as:  METAMUCIL  Take 1 packet by mouth daily as needed (for constipation).     ranitidine 150 MG tablet  Commonly known  as:  ZANTAC  Take 150-300 mg by mouth 2 (two) times daily as needed for heartburn.     SAW PALMETTO PO  Take 1 capsule by mouth 2 (two) times daily.     silodosin 8 MG Caps capsule  Commonly known as:  RAPAFLO  Take 8 mg by mouth daily with breakfast.     sodium chloride 0.65 % nasal spray  Commonly known as:  OCEAN  Place 1 spray into the nose as needed for congestion.     tamsulosin 0.4 MG Caps capsule  Commonly known as:  FLOMAX  Take 0.4 mg by mouth daily.     traMADol 50 MG tablet  Commonly known as:  ULTRAM  Take 1 tablet (50 mg total) by mouth every 8 (eight) hours as needed for moderate pain.         Brief H and P: For complete details please refer to admission H and P, but in brief patient is a 80 year old male with hypertension, mitral fibrillation, CAD on Plavix, prostate cancer presented with dark melanotic stools.  Patient reported that this was going on for 3 weeks. He also had given bloating with flatulence. He denied any nausea or vomiting. He did note acid reflux and indigestion. In ED patient was found to be guaiac positive in his stool. He was admitted for further workup.  Hospital Course:  Patient is a 80 year old male with mitral regurgitation, proximal atrial fibrillation, prior history of GI bleed, BPH, GERD, CAD presented with black tarry stools for 3 weeks, associated with bloating, flatulence, acid reflux, midsternal chest pain. No frank BRBPR.  Upper GI bleed Patient was admitted and Plavix was held. Patient also reported taking Naprosyn for arthritis which was also held. Patient has history of atrial fibrillation and he is not on anticoagulation due to prior history of GI bleed on Coumadin. He was placed on IV PPI, clears and GI consult was obtained. Patient was seen by Dr. Cristina Gong. Patient underwent EGD which showed no active bleeding or blood in the stomach at the time of the exam. No gastropathy despite the history of melanotic stools. He has small hiatal hernia with Schatzki's ring. GI recommended follow-up with primary care physician couple weeks for Hemoccult status and H&H. Also recommended continuing PPI. I discussed in detail with Dr. Irish Lack who recommended to continue Plavix as patient has a history of complex PCI of the LAD in July 2015.   Essential hypertension, benign - Currently stable   Atrial fibrillation with bradycardia: Heart rate in 40s - After discussion with Dr Irish Lack, amiodarone was decreased to 100 mg daily. He has a follow-up appointment on 02/22/14.   Chronic systolic heart failure - Currently still, euvolemic   Chest pain - Resolved likely due to GERD, troponins negative -EKG reviewed, LBBB not new   BPH with recent diagnosis of prostate cancer  Continue Flomax,Casodex   Day of Discharge BP 154/67 mmHg  Pulse 48  Temp(Src) 97.8 F (36.6 C) (Oral)   Resp 17  Ht 5\' 7"  (1.702 m)  Wt 82.6 kg (182 lb 1.6 oz)  BMI 28.51 kg/m2  SpO2 95%  Physical Exam: General: Alert and awake oriented x3 not in any acute distress. HEENT: anicteric sclera, pupils reactive to light and accommodation CVS: S1-S2 clear no murmur rubs or gallops Chest: clear to auscultation bilaterally, no wheezing rales or rhonchi Abdomen: soft nontender, nondistended, normal bowel sounds Extremities: no cyanosis, clubbing or edema noted bilaterally Neuro: Cranial nerves II-XII intact, no focal neurological deficits  The results of significant diagnostics from this hospitalization (including imaging, microbiology, ancillary and laboratory) are listed below for reference.    LAB RESULTS: Basic Metabolic Panel:  Recent Labs Lab 02/10/14 0635 02/11/14 0617  NA 138 138  K 3.9 4.1  CL 103 103  CO2 23 24  GLUCOSE 87 90  BUN 18 17  CREATININE 1.02 1.01  CALCIUM 8.9 8.9   Liver Function Tests:  Recent Labs Lab 02/10/14 0635  AST 23  ALT 21  ALKPHOS 69  BILITOT 0.9  PROT 5.7*  ALBUMIN 3.5    Recent Labs Lab 02/09/14 2000  LIPASE 32   No results for input(s): AMMONIA in the last 168 hours. CBC:  Recent Labs Lab 02/10/14 0635 02/11/14 0617  WBC 4.5 5.4  HGB 11.4* 11.2*  HCT 33.4* 32.7*  MCV 90.3 90.3  PLT 145* 144*   Cardiac Enzymes:  Recent Labs Lab 02/10/14 0635 02/10/14 1200  TROPONINI <0.30 <0.30   BNP: Invalid input(s): POCBNP CBG:  Recent Labs Lab 02/10/14 0606  GLUCAP 96    Significant Diagnostic Studies:  Dg Chest 2 View  02/09/2014   CLINICAL DATA:  Right-sided chest pain with radicular type symptoms in both upper extremities. History of prostate carcinoma. History of atrial fibrillation  EXAM: CHEST  2 VIEW  COMPARISON:  November 12, 2013  FINDINGS: There is no edema or consolidation. The heart size and pulmonary vascularity are normal. No adenopathy. There is extensive atherosclerotic change throughout the aorta. No  bone lesions are appreciable. No adenopathy.  IMPRESSION: Extensive atherosclerotic change.  No edema or consolidation.   Electronically Signed   By: Lowella Grip M.D.   On: 02/09/2014 20:37    2D ECHO:   Disposition and Follow-up:    DISPOSITION: Home  DISCHARGE FOLLOW-UP     Follow-up Information    Follow up with Jettie Booze., MD On 02/22/2014.   Specialty:  Interventional Cardiology   Why:  for hospital follow-up Appointment on Monday Dec. 28 @ 1130am   Contact information:   [address] [place] [address] [phone]       Follow up with Cleotis Nipper, MD. Schedule an appointment as soon as possible for a visit in 2 weeks.   Specialty:  Gastroenterology   Why:  for hospital follow-up, obtain labs, CBC   Contact information:   [address] [place]  [address] [phone]        Time spent on Discharge: 40 minutes  Signed:   RAI,RIPUDEEP M.D. [facility] 02/11/2014, 1:23 PM Pager: [phone]

## 2014-02-12 ENCOUNTER — Encounter (HOSPITAL_COMMUNITY): Payer: Self-pay | Admitting: Gastroenterology

## 2014-02-22 ENCOUNTER — Ambulatory Visit (INDEPENDENT_AMBULATORY_CARE_PROVIDER_SITE_OTHER): Payer: Medicare Other | Admitting: Cardiology

## 2014-02-22 ENCOUNTER — Encounter: Payer: Self-pay | Admitting: Cardiology

## 2014-02-22 ENCOUNTER — Telehealth: Payer: Self-pay

## 2014-02-22 VITALS — BP 142/76 | HR 44 | Ht 67.5 in | Wt 186.4 lb

## 2014-02-22 DIAGNOSIS — I5022 Chronic systolic (congestive) heart failure: Secondary | ICD-10-CM

## 2014-02-22 DIAGNOSIS — R5383 Other fatigue: Secondary | ICD-10-CM

## 2014-02-22 DIAGNOSIS — R001 Bradycardia, unspecified: Secondary | ICD-10-CM | POA: Diagnosis not present

## 2014-02-22 DIAGNOSIS — I208 Other forms of angina pectoris: Secondary | ICD-10-CM

## 2014-02-22 DIAGNOSIS — I2510 Atherosclerotic heart disease of native coronary artery without angina pectoris: Secondary | ICD-10-CM | POA: Diagnosis not present

## 2014-02-22 DIAGNOSIS — I480 Paroxysmal atrial fibrillation: Secondary | ICD-10-CM | POA: Diagnosis not present

## 2014-02-22 NOTE — Telephone Encounter (Signed)
Called to give pt update. [name] has spoken with Dr.Taylor who recommends that pt continue taking Amiodarone and keep 3 mo f/u appt with him.LMTCB

## 2014-02-22 NOTE — Progress Notes (Signed)
02/22/2014 [name]   01-11-1927  [id number]  Primary Physician Nathan Nipper, MD Primary Cardiologist: Dr [name]  HPI:  80 y.o. male with CAF, NSR on Amiodarone, bradycardia, LBBB,past GI bleed, and CAD. He most recently had a complex PCI of his LAD in July 2015 involving rotational atherectomy and DES.  Prior to the intervention, his ejection fraction was in the 30-35% range. If his EF had remained depressed Dr [name] was going to place a BiV pacemaker. Echocardiogram about a month after the intervention showed an improvement in his EF from 50-55%.              He is in the office today after a recent hospitalization for GI bleeding. ASA has been discontinued, he is on Plavix alone. During that hospitalization he was noted to be bradycardic. The pt denies syncope or pre syncope but admits to fatigue and exercise intolerance. His HR today is 44. He is going to [place] for 3 months this winter if we say it's OK for him to go.   Current Outpatient Prescriptions  Medication Sig Dispense Refill  . amiodarone (PACERONE) 100 MG tablet Take 1 tablet (100 mg total) by mouth daily. 30 tablet 3  . bicalutamide (CASODEX) 50 MG tablet Take 50 mg by mouth daily.   3  . clopidogrel (PLAVIX) 75 MG tablet Take 1 tablet (75 mg total) by mouth daily.    . diazepam (VALIUM) 5 MG tablet Take 5 mg by mouth at bedtime as needed for anxiety or sedation.     . dorzolamide-timolol (COSOPT) 22.3-6.8 MG/ML ophthalmic solution Place 1 drop into both eyes 2 (two) times daily.   2  . finasteride (PROSCAR) 5 MG tablet Take 5 mg by mouth daily.    . fluticasone (FLONASE) 50 MCG/ACT nasal spray Place 2 sprays into both nostrils daily.   1  . losartan (COZAAR) 50 MG tablet Take 25 mg by mouth daily. 1/2 tablet daily or as directed    . Multiple Vitamin (MULTIVITAMIN) tablet Take 1 tablet by mouth daily.      . nitroGLYCERIN (NITROSTAT) 0.4 MG SL tablet Place 1 tablet (0.4 mg total) under the tongue  every 5 (five) minutes as needed for chest pain. 20 tablet 0  . Omega-3 Fatty Acids (FISH OIL) 1000 MG CAPS Take 1 capsule by mouth daily.    [name] OVER THE COUNTER MEDICATION Take 1 tablet by mouth 2 (two) times daily. Vitamins for eyes    . OVER THE COUNTER MEDICATION Take 2 tablets by mouth daily. [facility] brand for prostate health    . pantoprazole (PROTONIX) 40 MG tablet Take 1 tablet (40 mg total) by mouth daily. 30 tablet 3  . psyllium (METAMUCIL) 58.6 % packet Take 1 packet by mouth daily as needed (for constipation).    . ranitidine (ZANTAC) 150 MG tablet Take 150-300 mg by mouth 2 (two) times daily as needed for heartburn.     . Saw Palmetto, Serenoa repens, (SAW PALMETTO PO) Take 1 capsule by mouth 2 (two) times daily.     . silodosin (RAPAFLO) 8 MG CAPS capsule Take 8 mg by mouth daily with breakfast.    . tamsulosin (FLOMAX) 0.4 MG CAPS capsule Take 0.4 mg by mouth daily.   0  . traMADol (ULTRAM) 50 MG tablet Take 1 tablet (50 mg total) by mouth every 8 (eight) hours as needed for moderate pain. 60 tablet 0  . sodium chloride (OCEAN) 0.65 % nasal spray Place  1 spray into the nose as needed for congestion.     No current facility-administered medications for this visit.    Allergies  Allergen Reactions  . Codeine Other (See Comments) and Anxiety    Insomnia/ hyper  . Warfarin Sodium Other (See Comments) and Anxiety    Hx gi bleed Hx gi bleed    History   Social History  . Marital Status: Widowed    Spouse Name: N/A    Number of Children: N/A  . Years of Education: N/A   Occupational History  . Not on file.   Social History Main Topics  . Smoking status: Former Smoker -- 1.00 packs/day for 14 years    Types: Cigarettes    Quit date: 01/05/1959  . Smokeless tobacco: Never Used  . Alcohol Use: 0.0 oz/week     Comment: 78/15/2015 "I'm not drinking anymore"  . Drug Use: No  . Sexual Activity: No   Other Topics Concern  . Not on file   Social History Narrative       Review of Systems: General: negative for chills, fever, night sweats or weight changes.  Cardiovascular: negative for chest pain, dyspnea on exertion, edema, orthopnea, palpitations, paroxysmal nocturnal dyspnea or shortness of breath Dermatological: negative for rash Respiratory: negative for cough or wheezing Urologic: negative for hematuria Abdominal: negative for nausea, vomiting, diarrhea, bright red blood per rectum, melena, or hematemesis Neurologic: negative for visual changes, syncope, or dizziness All other systems reviewed and are otherwise negative except as noted above.    Blood pressure 142/76, pulse 44, height 5' 7.5" (1.715 m), weight 186 lb 6.4 oz (84.55 kg).  General appearance: alert, cooperative, no distress and hearing aid in place Lungs: clear to auscultation bilaterally Heart: regular rate and rhythm and slow rate, S4  EKG NSR, SB, LBBB  ASSESSMENT AND PLAN:   Fatigue Chronic, possibly secondary to bradycardia  PAF (paroxysmal atrial fibrillation) Holding NSR-SB  GI bleed Recurrent, now on Plavix only  Coronary atherosclerosis of native coronary artery Complex PCI in July 2015 with DES  Bradycardia HR 44 today. He says he "can't do anything" but has not had syncope or pre syncope  Chronic systolic heart failure Follow up EF was 50-55% Aug 2015    PLAN  I will review with Dr [name]- ? Stop Amiodarone, consider pacemaker implant.   [name] [name] 02/22/2014 12:36 PM

## 2014-02-22 NOTE — Assessment & Plan Note (Signed)
Holding NSR-SB

## 2014-02-22 NOTE — Assessment & Plan Note (Signed)
Recurrent, now on Plavix only

## 2014-02-22 NOTE — Assessment & Plan Note (Addendum)
Chronic, possibly secondary to bradycardia

## 2014-02-22 NOTE — Telephone Encounter (Signed)
pt aware of Dr.Taylor's recommendation. he is to continue taking Amiodarone as prescribed and f/u with him in 3 months.pt verbalized understanding.

## 2014-02-22 NOTE — Assessment & Plan Note (Signed)
HR 44 today. He says he "can't do anything" but has not had syncope or pre syncope

## 2014-02-22 NOTE — Assessment & Plan Note (Signed)
Follow up EF was 50-55% Aug 2015

## 2014-02-22 NOTE — Addendum Note (Signed)
Addended by: Lamar Laundry on: 02/22/2014 04:18 PM   Modules accepted: Orders

## 2014-02-22 NOTE — Patient Instructions (Signed)
Your physician recommends that you continue on your current medications as directed. Please refer to the Current Medication list given to you today.  Your physician recommends that you schedule a follow-up appointment in: 3 months with Dr.Taylor

## 2014-02-22 NOTE — Assessment & Plan Note (Signed)
Complex PCI in July 2015 with DES

## 2014-03-04 DIAGNOSIS — D500 Iron deficiency anemia secondary to blood loss (chronic): Secondary | ICD-10-CM | POA: Diagnosis not present

## 2014-03-04 DIAGNOSIS — R143 Flatulence: Secondary | ICD-10-CM | POA: Diagnosis not present

## 2014-03-04 DIAGNOSIS — K921 Melena: Secondary | ICD-10-CM | POA: Diagnosis not present

## 2014-03-08 ENCOUNTER — Other Ambulatory Visit: Payer: Self-pay | Admitting: Interventional Cardiology

## 2014-03-12 ENCOUNTER — Telehealth: Payer: Self-pay | Admitting: *Deleted

## 2014-03-12 DIAGNOSIS — H01024 Squamous blepharitis left upper eyelid: Secondary | ICD-10-CM | POA: Diagnosis not present

## 2014-03-12 DIAGNOSIS — Z961 Presence of intraocular lens: Secondary | ICD-10-CM | POA: Diagnosis not present

## 2014-03-12 DIAGNOSIS — H01021 Squamous blepharitis right upper eyelid: Secondary | ICD-10-CM | POA: Diagnosis not present

## 2014-03-12 DIAGNOSIS — H34832 Tributary (branch) retinal vein occlusion, left eye: Secondary | ICD-10-CM | POA: Diagnosis not present

## 2014-03-12 DIAGNOSIS — H4011X0 Primary open-angle glaucoma, stage unspecified: Secondary | ICD-10-CM | POA: Diagnosis not present

## 2014-03-12 DIAGNOSIS — H01022 Squamous blepharitis right lower eyelid: Secondary | ICD-10-CM | POA: Diagnosis not present

## 2014-03-12 DIAGNOSIS — H01025 Squamous blepharitis left lower eyelid: Secondary | ICD-10-CM | POA: Diagnosis not present

## 2014-03-12 NOTE — Telephone Encounter (Signed)
CALLED PATIENT TO ASK QUESTION, SPOKE WITH PATIENT AND HE ASKED THAT I CALL BACK NEXT WEEK

## 2014-03-20 ENCOUNTER — Telehealth: Payer: Self-pay | Admitting: Physician Assistant

## 2014-03-20 NOTE — Telephone Encounter (Signed)
      Daughter called about BP spikes and LE swelling. I spoke with the patient who said he was having CP and SOB. I advised him to come in to be seen. He said he would rather wait until Monday to call the office to be seen early next week. I told him to call us back or call EMS if he got any worse.    Angelena Form PA-C  [facility]

## 2014-04-05 ENCOUNTER — Telehealth: Payer: Self-pay | Admitting: Internal Medicine

## 2014-04-05 ENCOUNTER — Encounter (HOSPITAL_COMMUNITY): Payer: Self-pay | Admitting: Emergency Medicine

## 2014-04-05 ENCOUNTER — Emergency Department (HOSPITAL_COMMUNITY): Payer: Medicare Other

## 2014-04-05 ENCOUNTER — Emergency Department (HOSPITAL_COMMUNITY)
Admission: EM | Admit: 2014-04-05 | Discharge: 2014-04-05 | Disposition: A | Discharge status: Home / Self Care | Payer: Medicare Other | Attending: Emergency Medicine | Admitting: Emergency Medicine

## 2014-04-05 DIAGNOSIS — R0789 Other chest pain: Secondary | ICD-10-CM | POA: Diagnosis not present

## 2014-04-05 DIAGNOSIS — Z87891 Personal history of nicotine dependence: Secondary | ICD-10-CM | POA: Insufficient documentation

## 2014-04-05 DIAGNOSIS — K219 Gastro-esophageal reflux disease without esophagitis: Secondary | ICD-10-CM | POA: Diagnosis not present

## 2014-04-05 DIAGNOSIS — Z79899 Other long term (current) drug therapy: Secondary | ICD-10-CM | POA: Insufficient documentation

## 2014-04-05 DIAGNOSIS — I10 Essential (primary) hypertension: Secondary | ICD-10-CM | POA: Diagnosis not present

## 2014-04-05 DIAGNOSIS — F329 Major depressive disorder, single episode, unspecified: Secondary | ICD-10-CM | POA: Insufficient documentation

## 2014-04-05 DIAGNOSIS — Z7902 Long term (current) use of antithrombotics/antiplatelets: Secondary | ICD-10-CM | POA: Diagnosis not present

## 2014-04-05 DIAGNOSIS — I2510 Atherosclerotic heart disease of native coronary artery without angina pectoris: Secondary | ICD-10-CM | POA: Insufficient documentation

## 2014-04-05 DIAGNOSIS — I480 Paroxysmal atrial fibrillation: Secondary | ICD-10-CM | POA: Diagnosis not present

## 2014-04-05 DIAGNOSIS — I252 Old myocardial infarction: Secondary | ICD-10-CM | POA: Insufficient documentation

## 2014-04-05 DIAGNOSIS — N401 Enlarged prostate with lower urinary tract symptoms: Secondary | ICD-10-CM | POA: Diagnosis not present

## 2014-04-05 DIAGNOSIS — G8929 Other chronic pain: Secondary | ICD-10-CM | POA: Diagnosis not present

## 2014-04-05 DIAGNOSIS — M1990 Unspecified osteoarthritis, unspecified site: Secondary | ICD-10-CM | POA: Diagnosis not present

## 2014-04-05 DIAGNOSIS — Z7951 Long term (current) use of inhaled steroids: Secondary | ICD-10-CM | POA: Insufficient documentation

## 2014-04-05 DIAGNOSIS — Z9889 Other specified postprocedural states: Secondary | ICD-10-CM | POA: Diagnosis not present

## 2014-04-05 DIAGNOSIS — H9190 Unspecified hearing loss, unspecified ear: Secondary | ICD-10-CM | POA: Diagnosis not present

## 2014-04-05 DIAGNOSIS — R079 Chest pain, unspecified: Secondary | ICD-10-CM | POA: Diagnosis not present

## 2014-04-05 DIAGNOSIS — Z8546 Personal history of malignant neoplasm of prostate: Secondary | ICD-10-CM | POA: Diagnosis not present

## 2014-04-05 DIAGNOSIS — Z791 Long term (current) use of non-steroidal anti-inflammatories (NSAID): Secondary | ICD-10-CM | POA: Diagnosis not present

## 2014-04-05 DIAGNOSIS — I509 Heart failure, unspecified: Secondary | ICD-10-CM | POA: Diagnosis not present

## 2014-04-05 DIAGNOSIS — Z9861 Coronary angioplasty status: Secondary | ICD-10-CM | POA: Insufficient documentation

## 2014-04-05 DIAGNOSIS — Z7982 Long term (current) use of aspirin: Secondary | ICD-10-CM | POA: Insufficient documentation

## 2014-04-05 DIAGNOSIS — R011 Cardiac murmur, unspecified: Secondary | ICD-10-CM | POA: Insufficient documentation

## 2014-04-05 DIAGNOSIS — F419 Anxiety disorder, unspecified: Secondary | ICD-10-CM | POA: Diagnosis not present

## 2014-04-05 DIAGNOSIS — Z95818 Presence of other cardiac implants and grafts: Secondary | ICD-10-CM | POA: Diagnosis not present

## 2014-04-05 LAB — CBC WITH DIFFERENTIAL/PLATELET
Basophils Absolute: 0 10*3/uL (ref 0.0–0.1)
Basophils Relative: 0 % (ref 0–1)
EOS ABS: 0.2 10*3/uL (ref 0.0–0.7)
Eosinophils Relative: 4 % (ref 0–5)
HCT: 39.7 % (ref 39.0–52.0)
Hemoglobin: 13.7 g/dL (ref 13.0–17.0)
Lymphocytes Relative: 31 % (ref 12–46)
Lymphs Abs: 1.9 10*3/uL (ref 0.7–4.0)
MCH: 32.1 pg (ref 26.0–34.0)
MCHC: 34.5 g/dL (ref 30.0–36.0)
MCV: 93 fL (ref 78.0–100.0)
MONO ABS: 0.5 10*3/uL (ref 0.1–1.0)
Monocytes Relative: 9 % (ref 3–12)
Neutro Abs: 3.3 10*3/uL (ref 1.7–7.7)
Neutrophils Relative %: 56 % (ref 43–77)
Platelets: 153 10*3/uL (ref 150–400)
RBC: 4.27 MIL/uL (ref 4.22–5.81)
RDW: 13.7 % (ref 11.5–15.5)
WBC: 6 10*3/uL (ref 4.0–10.5)

## 2014-04-05 LAB — BASIC METABOLIC PANEL
ANION GAP: 7 (ref 5–15)
BUN: 20 mg/dL (ref 6–23)
CALCIUM: 9.3 mg/dL (ref 8.4–10.5)
CHLORIDE: 101 mmol/L (ref 96–112)
CO2: 26 mmol/L (ref 19–32)
Creatinine, Ser: 1.19 mg/dL (ref 0.50–1.35)
GFR calc Af Amer: 62 mL/min — ABNORMAL LOW (ref >90)
GFR, EST NON AFRICAN AMERICAN: 54 mL/min — AB (ref >90)
GLUCOSE: 107 mg/dL — AB (ref 70–99)
Potassium: 4.3 mmol/L (ref 3.5–5.1)
Sodium: 134 mmol/L — ABNORMAL LOW (ref 135–145)

## 2014-04-05 LAB — I-STAT TROPONIN, ED: Troponin i, poc: 0 ng/mL (ref 0.00–0.08)

## 2014-04-05 MED ORDER — ASPIRIN 325 MG PO TABS
325.0000 mg | ORAL_TABLET | Freq: Once | ORAL | Status: AC
  Administered 2014-04-05: 325 mg via ORAL
  Filled 2014-04-05: qty 1

## 2014-04-05 MED ORDER — FENTANYL CITRATE 0.05 MG/ML IJ SOLN
25.0000 ug | Freq: Once | INTRAMUSCULAR | Status: AC
  Administered 2014-04-05: 25 ug via INTRAVENOUS
  Filled 2014-04-05: qty 2

## 2014-04-05 NOTE — Telephone Encounter (Signed)
Pt c/o BP issue: STAT if pt c/o blurred vision, one-sided weakness or slurred speech  1. What are your last 5 BP readings? 138/119  2. Are you having any other symptoms (ex. Dizziness, headache, blurred vision, passed out)? Chest pain 3. What is your BP issue? high    Pt c/o of Chest Pain: STAT if CP now or developed within 24 hours  1. Are you having CP right now? No  2. Are you experiencing any other symptoms (ex. SOB, nausea, vomiting, sweating)? No  3. How long have you been experiencing CP? 1week  4. Is your CP continuous or coming and going? Going and coming  5. Have you taken Nitroglycerin? No  ? Pt stated he isn't having chest pain now he is having chest pressure. Pt seem to be confused b/c 1st he stated he had chest pain and then at the end he said it's just pressure. Please call pt, his care giver is at the home with him at present,.

## 2014-04-05 NOTE — ED Provider Notes (Signed)
CSN: [id number]     Arrival date & time 04/05/14  1301 History   First MD Initiated Contact with Patient 04/05/14 1318     Chief Complaint  Patient presents with  . Chest Pain     (Consider location/radiation/quality/duration/timing/severity/associated sxs/prior Treatment) Patient is a 81 y.o. male presenting with chest pain. The history is provided by the patient. No language interpreter was used.  Chest Pain Pain location:  L chest Pain quality comment:  Soreness Pain radiates to:  Does not radiate Pain radiates to the back: no   Pain severity:  Moderate Duration:  2 weeks Timing:  Constant Progression:  Unchanged Chronicity:  New Context: at rest   Relieved by:  Nothing Exacerbated by: palpation. Ineffective treatments:  None tried Associated symptoms: no abdominal pain, no anorexia, no back pain, no cough, no diaphoresis, no dizziness, no dysphagia, no fatigue, no fever, no headache, no nausea, no numbness, no shortness of breath, not vomiting and no weakness   Associated symptoms comment:  Exercise intolerance  Risk factors: coronary artery disease   Risk factors: no hypertension, no prior DVT/PE and no smoking     Past Medical History  Diagnosis Date  . PAF (paroxysmal atrial fibrillation)     controlled w/ amiodarone; not on coumadin due to hx of GI bleed  . Mitral regurgitation   . GERD (gastroesophageal reflux disease)     occ. take prevacid  . Meningioma right -sided w/ right VI palsy    followed by dr Gaynell Face  . Diverticulitis of colon with bleeding     s/p sigmoid resection '88  . History of GI diverticular bleed april 2012    transfused blood and resolved without surgical intervention  . Impotence, organic     s/p penile prosthesis 1990's  . BPH (benign prostatic hypertrophy) with urinary obstruction     s/p turp yrs ago  . Blood transfusion     "related to a surgery"  . Impaired hearing bilateral     only left hearing aid  . Bradycardia     Amio  d/c'd 08/2013; brady arrest 08/2013 after PCI >>> recurrent AF >>> Amiodarone restarted (may need BiV pacer if EF does not recover; consider Holter if + sx's)  . Cardiomyopathy     a. Echo (08/2013):  EF 30-35%, AS hypokinesis, Gr 1 diast dysfn, mild MR, mild LAE >>> b. improved EF 50-55% by echo 8/15  . CAD (coronary artery disease)     a. s/p MI and prior PCI of LAD;  b. LHC (08/2013):  prox LAD 60-70%, mid LAD stents ok, ostial lesion at Dx jailed by stent, mild CFX and RCA disesase >>>  PCI (09/08/13):  rotational atherectomy + Promus DES to prox LAD  . Mitral valve disorders   . Hx of echocardiogram     Echo (8/15):  Mod LVH, EF 50-55%, Gr 1 DD, mild MR, mild RAE  . Prostate cancer 11/30/13    Gleason 8, volume 22.14 cc  . Atrial fibrillation   . Heart murmur     with mitral valve regurgitation  . Anxiety   . Depression   . Hypertension   . CHF (congestive heart failure)   . Myocardial infarction 1980's- medical intervention    "so mild I didn't know I'd had it"  . Sleep apnea     non-compliant cpap  . DJD (degenerative joint disease) hips and knees    s/p bilateral total replacements  . DDD (degenerative disc disease)  chronic back pain  . Arthritis     "I'm covered up w/it"  . Chronic lower back pain   . Incomplete bladder emptying    Past Surgical History  Procedure Laterality Date  . Cholecystectomy    . Total hip arthroplasty Right 03-25-08--  . Total hip arthroplasty Left 2005  . Total knee arthroplasty Right 2004  . Total knee arthroplasty Left 1997  . Inguinal hernia repair Bilateral   . Cataract extraction w/ intraocular lens  implant, bilateral Bilateral ~ 2000  . Appendectomy    . Transurethral resection of prostate  "years ago"  . Penile prosthesis implant  1990's  . Shoulder open rotator cuff repair Left   . Inner ear surgery Right yrs ago    "trying to get my hearing back  . Cardiac catheterization  2007    noncritical cad (results w/ chart)  . Coronary  angioplasty with stent placement  08-03-08    drug-eluting stent x2 distal and mid lad  . Transurethral resection of prostate  01/08/2011    Procedure: TRANSURETHRAL RESECTION OF THE PROSTATE (TURP);  Surgeon: Franchot Gallo;  Location: [facility];  Service: Urology;  Laterality: N/A;  GYRUS   . Prostate biopsy  11/30/13    Gleason 8, vol 22.14 cc  . Gamma knife radiation  2000    [facility] for meningioma, last eval 2013- no change  . Left heart catheterization with coronary angiogram N/A 09/04/2013    Procedure: LEFT HEART CATHETERIZATION WITH CORONARY ANGIOGRAM;  Surgeon: Jettie Booze, MD;  Location: [facility] CATH LAB;  Service: Cardiovascular;  Laterality: N/A;  . Percutaneous coronary rotoblator intervention (pci-r) N/A 09/08/2013    Procedure: PERCUTANEOUS CORONARY ROTOBLATOR INTERVENTION (PCI-R);  Surgeon: Jettie Booze, MD;  Location: [facility] CATH LAB;  Service: Cardiovascular;  Laterality: N/A;  . Revision total knee arthroplasty Right   . Total hip revision Right 3-4 times  . Closed reduction hip dislocation Right "several"  . Esophagogastroduodenoscopy N/A 02/11/2014    Procedure: ESOPHAGOGASTRODUODENOSCOPY (EGD);  Surgeon: Cleotis Nipper, MD;  Location: [facility] ENDOSCOPY;  Service: Endoscopy;  Laterality: N/A;   Family History  Problem Relation Age of Onset  . Heart disease Mother   . Heart attack Mother   . Cancer    . Emphysema Father   . Cancer Brother     liver cancer   History  Substance Use Topics  . Smoking status: Former Smoker -- 1.00 packs/day for 14 years    Types: Cigarettes    Quit date: 01/05/1959  . Smokeless tobacco: Never Used  . Alcohol Use: 0.0 oz/week     Comment: 02/09/2014 "I'm not drinking anymore"    Review of Systems  Constitutional: Negative for fever, diaphoresis, activity change, appetite change and fatigue.  HENT: Negative for congestion, facial swelling, rhinorrhea and trouble swallowing.   Eyes: Negative for photophobia and  pain.  Respiratory: Negative for cough, chest tightness and shortness of breath.   Cardiovascular: Positive for chest pain. Negative for leg swelling.  Gastrointestinal: Negative for nausea, vomiting, abdominal pain, diarrhea, constipation and anorexia.  Endocrine: Negative for polydipsia and polyuria.  Genitourinary: Negative for dysuria, urgency, decreased urine volume and difficulty urinating.  Musculoskeletal: Negative for back pain and gait problem.  Skin: Negative for color change, rash and wound.  Allergic/Immunologic: Negative for immunocompromised state.  Neurological: Negative for dizziness, facial asymmetry, speech difficulty, weakness, numbness and headaches.  Psychiatric/Behavioral: Negative for confusion, decreased concentration and agitation.      Allergies  Codeine and Warfarin sodium  Home Medications   Prior to Admission medications   Medication Sig Start Date End Date Taking? Authorizing Provider  amiodarone (PACERONE) 100 MG tablet Take 1 tablet (100 mg total) by mouth daily. 02/11/14  Yes Ripudeep Krystal Eaton, MD  aspirin 81 MG tablet Take 81 mg by mouth daily.   Yes Historical Provider, MD  bicalutamide (CASODEX) 50 MG tablet Take 50 mg by mouth daily.  12/29/13  Yes Historical Provider, MD  cholecalciferol (VITAMIN D) 1000 UNITS tablet Take 1,000 Units by mouth daily.   Yes Historical Provider, MD  clopidogrel (PLAVIX) 75 MG tablet TAKE 1 TABLET BY MOUTH EVERY DAY 03/08/14  Yes Jettie Booze, MD  dorzolamide-timolol (COSOPT) 22.3-6.8 MG/ML ophthalmic solution Place 1 drop into both eyes 2 (two) times daily.  11/12/13  Yes Historical Provider, MD  finasteride (PROSCAR) 5 MG tablet Take 5 mg by mouth daily. 12/08/13  Yes Historical Provider, MD  fluorouracil (EFUDEX) 5 % cream Apply 1 application topically 2 (two) times daily.   Yes Historical Provider, MD  fluticasone (FLONASE) 50 MCG/ACT nasal spray Place 2 sprays into both nostrils daily.  12/08/13  Yes Historical  Provider, MD  losartan (COZAAR) 50 MG tablet Take 50 mg by mouth daily. 1/2 tablet daily or as directed 07/10/13  Yes Jettie Booze, MD  Multiple Vitamin (MULTIVITAMIN) tablet Take 1 tablet by mouth daily.     Yes Historical Provider, MD  naproxen (NAPROSYN) 375 MG tablet Take 375 mg by mouth daily.   Yes Historical Provider, MD  Omega-3 Fatty Acids (FISH OIL) 1000 MG CAPS Take 1 capsule by mouth daily.   Yes Historical Provider, MD  OVER THE COUNTER MEDICATION Take 1 tablet by mouth 2 (two) times daily. Vitamins for eyes   Yes Historical Provider, MD  OVER THE COUNTER MEDICATION Take 2 tablets by mouth daily. [facility] brand for prostate health   Yes Historical Provider, MD  pantoprazole (PROTONIX) 40 MG tablet Take 1 tablet (40 mg total) by mouth daily. 02/11/14  Yes Ripudeep Krystal Eaton, MD  Probiotic Product (PROBIOTIC DAILY PO) Take 1 capsule by mouth daily.   Yes Historical Provider, MD  psyllium (METAMUCIL) 58.6 % packet Take 1 packet by mouth daily as needed (for constipation).   Yes Historical Provider, MD  ranitidine (ZANTAC) 150 MG tablet Take 150-300 mg by mouth 2 (two) times daily as needed for heartburn.    Yes Historical Provider, MD  silodosin (RAPAFLO) 8 MG CAPS capsule Take 8 mg by mouth daily with breakfast. 12/08/13  Yes Historical Provider, MD  tamsulosin (FLOMAX) 0.4 MG CAPS capsule Take 0.4 mg by mouth daily.  01/16/14  Yes Historical Provider, MD  diazepam (VALIUM) 5 MG tablet Take 5 mg by mouth at bedtime as needed for anxiety or sedation.     Historical Provider, MD  nitroGLYCERIN (NITROSTAT) 0.4 MG SL tablet Place 1 tablet (0.4 mg total) under the tongue every 5 (five) minutes as needed for chest pain. 09/21/13   Liliane Shi, PA-C  traMADol (ULTRAM) 50 MG tablet Take 1 tablet (50 mg total) by mouth every 8 (eight) hours as needed for moderate pain. Patient taking differently: Take 50 mg by mouth daily. In the afternoon 02/11/14   Ripudeep K Rai, MD   BP 161/78 mmHg   Pulse 58  Temp(Src) 97.8 F (36.6 C) (Oral)  Resp 16  SpO2 99% Physical Exam  Constitutional: He is oriented to person, place, and time. He appears well-developed and  well-nourished. No distress.  HENT:  Head: Normocephalic and atraumatic.  Mouth/Throat: No oropharyngeal exudate.  Eyes: Pupils are equal, round, and reactive to light.  Neck: Normal range of motion. Neck supple.  Cardiovascular: Normal rate, regular rhythm and normal heart sounds.  Exam reveals no gallop and no friction rub.   No murmur heard. Pulmonary/Chest: Effort normal and breath sounds normal. No respiratory distress. He has no wheezes. He has no rales.    Abdominal: Soft. Bowel sounds are normal. He exhibits no distension and no mass. There is no tenderness. There is no rebound and no guarding.  Musculoskeletal: Normal range of motion. He exhibits no edema or tenderness.  Neurological: He is alert and oriented to person, place, and time.  Skin: Skin is warm and dry.  Psychiatric: He has a normal mood and affect.    ED Course  Procedures (including critical care time) Labs Review Labs Reviewed  BASIC METABOLIC PANEL - Abnormal; Notable for the following:    Sodium 134 (*)    Glucose, Bld 107 (*)    GFR calc non Af Amer 54 (*)    GFR calc Af Amer 62 (*)    All other components within normal limits  CBC WITH DIFFERENTIAL/PLATELET  I-STAT TROPOININ, ED    Imaging Review No results found.   EKG Interpretation   Date/Time:  Monday April 05 2014 13:09:16 EST Ventricular Rate:  48 PR Interval:  244 QRS Duration: 152 QT Interval:  506 QTC Calculation: 452 R Axis:   -43 Text Interpretation:  Marked sinus bradycardia with 1st degree A-V block  Left axis deviation Left bundle branch block Abnormal ECG No significant  change since last tracing Reconfirmed by Eliannah Hinde  MD, Chrystina Naff ([id number]) on  04/05/2014 1:49:28 PM      MDM   Final diagnoses:  Atypical chest pain    Pt is a 81 y.o. male with Pmhx  as above who presents with 2 weeks of constant chest soreness, and BP fluctuation.  He is easily short of breath with exertion, though denies shortness of breath at rest.  Also dark.  Denies associated nausea, vomiting, diaphoresis, fevers, chills, calf pain or swelling.  Pain is worse with palpation, improved by rest.  On physical exam, patient is bradycardic prior signs otherwise stable.  He appears in no acute distress.  He is reproducible left sided chest wall pain on fields are clear.  EKG shows sinus bradycardia with a first-degree AV block as well as left bundle branch block.  Pts pain resolved after 1 dose pain meds. Given pain constant for 2 weeks with negtive trop, doubt ACS. Spoke w/ cardiology will plan on close outpt f/u.    [name] evaluation in the Emergency Department is complete. It has been determined that no acute conditions requiring further emergency intervention are present at this time. The patient/guardian have been advised of the diagnosis and plan. We have discussed signs and symptoms that warrant return to the ED, such as changes or worsening in symptoms, worsening pain, SOB, fever.       Ernestina Patches, MD 04/08/14 1620

## 2014-04-05 NOTE — ED Notes (Signed)
Xray here to transport patient for chest xray. Pt states he would like to wait and see what his results are before getting an IV or IV pain medications. Pt agrees to take aspirin upon return.

## 2014-04-05 NOTE — Telephone Encounter (Signed)
Spoke with pt and he states that he has been having chest pressure for 7-10 days that comes and goes. Pt states that his BP today was 138/119, pulse 79. Pt states yesterday BP was 136/62, pulse 55, and later was 175/129, pulse 61. Pt complains of headache, SOB, fatigue and occasional dizziness when standing. Spoke with Truitt Merle, NP flex today and she recommended that pt go to ED for evaluation. Called pt back and informed him of Lori's recommendation to go to ED. Pt verbalized understanding and was in agreement with this plan.

## 2014-04-05 NOTE — Discharge Instructions (Signed)

## 2014-04-05 NOTE — Telephone Encounter (Signed)
Dr Irish Lack is his primary cardiologist.  Will forward to Maude Leriche

## 2014-04-05 NOTE — ED Notes (Signed)
Pt c/o mid sternal CP x weeks; pt denies SOB; pt sts feels like indigestion; pt sts some fluctuation in BP as well

## 2014-04-13 ENCOUNTER — Encounter: Payer: Self-pay | Admitting: Interventional Cardiology

## 2014-04-13 ENCOUNTER — Ambulatory Visit (INDEPENDENT_AMBULATORY_CARE_PROVIDER_SITE_OTHER): Payer: Medicare Other | Admitting: Interventional Cardiology

## 2014-04-13 VITALS — BP 160/80 | HR 88 | Ht 67.5 in | Wt 188.0 lb

## 2014-04-13 DIAGNOSIS — I10 Essential (primary) hypertension: Secondary | ICD-10-CM | POA: Diagnosis not present

## 2014-04-13 DIAGNOSIS — I059 Rheumatic mitral valve disease, unspecified: Secondary | ICD-10-CM

## 2014-04-13 DIAGNOSIS — I2510 Atherosclerotic heart disease of native coronary artery without angina pectoris: Secondary | ICD-10-CM

## 2014-04-13 MED ORDER — LOSARTAN POTASSIUM 50 MG PO TABS: 25.0000 mg | ORAL_TABLET | Freq: Every day | ORAL | Status: DC

## 2014-04-13 NOTE — Patient Instructions (Addendum)
Your physician has recommended you make the following change in your medication:  1)  If Systolic Blood Pressure is greater than 150, you may take one extra Losartan  Your physician wants you to follow-up in: 6 months with Dr. Irish Lack.  You will receive a reminder letter in the mail two months in advance. If you don't receive a letter, please call our office to schedule the follow-up appointment.

## 2014-04-13 NOTE — Progress Notes (Signed)
Patient ID: [name], male   DOB: 12/10/1926, 81 y.o.   MRN: [id number] Patient ID: [name], male   DOB: 1926/04/03, 81 y.o.   MRN: [id number]    [address], [place] [place], [place]  [address] Phone: [phone] Fax:  [phone]  Date:  04/13/2014   ID:  [name], DOB 06/15/1926, MRN [id number]  PCP:  Cleotis Nipper, MD      History of Present Illness: [name] is a 81 y.o. male with prior AFib, CAD and mitral regurgitation.  He most recently had a complex PCI of his LAD in July 2015 involving rotational atherectomy. He had a hematoma in his right wrist at the time of the diagnostic cath. He had a right groin hematoma after the intervention. Prior to the intervention, his ejection fraction was in the 30-35% range. Echocardiogram about a month after the intervention showed an improvement in his EF from 50-55%. He continued to have some fatigue post procedure. He has had issues with his prostate and difficulty sleeping.   He denies any further chest pain. He is trying to exercise at home. He used a  treadmill today for the first time. He is not having discomfort in his chest. He had shortness of breath with exercise today. He has a stationalry bike he can use as well.   He thinks his overall fatigue is from lack of sleep. He wants something for sleep.  He had back surgery and has chronic pain.  His balance is off as well.  He had a GI bleed with Naprosyn.  He has had intermittent bradycardia but no lightheadedness or syncope.     Wt Readings from Last 3 Encounters:  04/13/14 188 lb (85.276 kg)  02/22/14 186 lb 6.4 oz (84.55 kg)  02/11/14 182 lb 1.6 oz (82.6 kg)     Past Medical History  Diagnosis Date  . PAF (paroxysmal atrial fibrillation)     controlled w/ amiodarone; not on coumadin due to hx of GI bleed  . Mitral regurgitation   . GERD (gastroesophageal reflux disease)     occ. take prevacid  . Meningioma right -sided w/ right VI palsy    followed by dr Gaynell Face  . Diverticulitis of colon with bleeding     s/p sigmoid resection '88  . History of GI diverticular bleed april 2012    transfused blood and resolved without surgical intervention  . Impotence, organic     s/p penile prosthesis 1990's  . BPH (benign prostatic hypertrophy) with urinary obstruction     s/p turp yrs ago  . Blood transfusion     "related to a surgery"  . Impaired hearing bilateral     only left hearing aid  . Bradycardia     Amio d/c'd 08/2013; brady arrest 08/2013 after PCI >>> recurrent AF >>> Amiodarone restarted (may need BiV pacer if EF does not recover; consider Holter if + sx's)  . Cardiomyopathy     a. Echo (08/2013):  EF 30-35%, AS hypokinesis, Gr 1 diast dysfn, mild MR, mild LAE >>> b. improved EF 50-55% by echo 8/15  . CAD (coronary artery disease)     a. s/p MI and prior PCI of LAD;  b. LHC (08/2013):  prox LAD 60-70%, mid LAD stents ok, ostial lesion at Dx jailed by stent, mild CFX and RCA disesase >>>  PCI (09/08/13):  rotational atherectomy + Promus DES to prox LAD  . Mitral valve disorders   .  Hx of echocardiogram     Echo (8/15):  Mod LVH, EF 50-55%, Gr 1 DD, mild MR, mild RAE  . Prostate cancer 11/30/13    Gleason 8, volume 22.14 cc  . Atrial fibrillation   . Heart murmur     with mitral valve regurgitation  . Anxiety   . Depression   . Hypertension   . CHF (congestive heart failure)   . Myocardial infarction 1980's- medical intervention    "so mild I didn't know I'd had it"  . Sleep apnea     non-compliant cpap  . DJD (degenerative joint disease) hips and knees    s/p bilateral total replacements  . DDD (degenerative disc disease)     chronic back pain  . Arthritis     "I'm covered up w/it"  . Chronic lower back pain   . Incomplete bladder emptying     Current Outpatient Prescriptions  Medication Sig Dispense Refill  . amiodarone (PACERONE) 100 MG tablet Take 1 tablet (100 mg total) by mouth daily. 30 tablet 3  .  aspirin 81 MG tablet Take 81 mg by mouth daily.    . bicalutamide (CASODEX) 50 MG tablet Take 50 mg by mouth daily.   3  . cholecalciferol (VITAMIN D) 1000 UNITS tablet Take 1,000 Units by mouth daily.    . clopidogrel (PLAVIX) 75 MG tablet TAKE 1 TABLET BY MOUTH EVERY DAY 30 tablet 5  . dorzolamide-timolol (COSOPT) 22.3-6.8 MG/ML ophthalmic solution Place 1 drop into both eyes 2 (two) times daily.   2  . finasteride (PROSCAR) 5 MG tablet Take 5 mg by mouth daily.    . fluorouracil (EFUDEX) 5 % cream Apply 1 application topically 2 (two) times daily.    . fluticasone (FLONASE) 50 MCG/ACT nasal spray Place 2 sprays into both nostrils daily.   1  . losartan (COZAAR) 50 MG tablet Take 50 mg by mouth daily. 1/2 tablet daily or as directed    . Multiple Vitamin (MULTIVITAMIN) tablet Take 1 tablet by mouth daily.      . Omega-3 Fatty Acids (FISH OIL) 1000 MG CAPS Take 1 capsule by mouth daily.    [name] OVER THE COUNTER MEDICATION Take 1 tablet by mouth 2 (two) times daily. Vitamins for eyes    . OVER THE COUNTER MEDICATION Take 2 tablets by mouth daily. [facility] brand for prostate health    . pantoprazole (PROTONIX) 40 MG tablet Take 1 tablet (40 mg total) by mouth daily. 30 tablet 3  . Probiotic Product (PROBIOTIC DAILY PO) Take 1 capsule by mouth daily.    . psyllium (METAMUCIL) 58.6 % packet Take 1 packet by mouth daily as needed (for constipation).    . ranitidine (ZANTAC) 150 MG tablet Take 150-300 mg by mouth 2 (two) times daily as needed for heartburn.     . silodosin (RAPAFLO) 8 MG CAPS capsule Take 8 mg by mouth daily with breakfast.    . tamsulosin (FLOMAX) 0.4 MG CAPS capsule Take 0.4 mg by mouth daily.   0  . diazepam (VALIUM) 5 MG tablet Take 5 mg by mouth at bedtime as needed for anxiety or sedation.     . naproxen (NAPROSYN) 375 MG tablet Take 375 mg by mouth daily.    . nitroGLYCERIN (NITROSTAT) 0.4 MG SL tablet Place 1 tablet (0.4 mg total) under the tongue every 5 (five) minutes as  needed for chest pain. (Patient not taking: Reported on 04/13/2014) 20 tablet 0  . traMADol (ULTRAM) 50  MG tablet Take 1 tablet (50 mg total) by mouth every 8 (eight) hours as needed for moderate pain. (Patient not taking: Reported on 04/13/2014) 60 tablet 0   No current facility-administered medications for this visit.    Allergies:    Allergies  Allergen Reactions  . Codeine Other (See Comments) and Anxiety    Insomnia/ hyper  . Warfarin Sodium Other (See Comments) and Anxiety    Hx gi bleed Hx gi bleed    Social History:  The patient  reports that he quit smoking about 55 years ago. His smoking use included Cigarettes. He has a 14 pack-year smoking history. He has never used smokeless tobacco. He reports that he drinks alcohol. He reports that he does not use illicit drugs.   Family History:  The patient's family history includes Cancer in his brother and another family member; Emphysema in his father; Heart attack in his mother; Heart disease in his mother.   ROS:  Please see the history of present illness.  No nausea, vomiting.  No fevers, chills.  No focal weakness.  No dysuria. Fatigue.   All other systems reviewed and negative.   PHYSICAL EXAM: VS:  BP 160/80 mmHg  Pulse 88  Ht 5' 7.5" (1.715 m)  Wt 188 lb (85.276 kg)  BMI 28.99 kg/m2 Well nourished, well developed, in no acute distress HEENT: normal Neck: no JVD, no carotid bruits Cardiac:  normal S1, S2; RRR; 2/6 systolic murmur Lungs:  clear to auscultation bilaterally, no wheezing, rhonchi or rales Abd: soft, nontender, no hepatomegaly Ext: Trace right ankle edema Skin: warm and dry Neuro:   no focal abnormalities noted Psych: flat affect      ASSESSMENT AND PLAN:  1. CAD: Continue the clopidogrel given the most recent stent. He has a history of GI bleeding in the past. Stop aspirin at this time. He is not having angina. His ejection fraction is improved. Continue medical therapy. Some of his fatigue may be  related to age and deconditioning. Increase exercise slowly. 2. Mitral regurgitation: Was not as severe on most recent echocardiogram. No signs of CHF at this time. Continue to monitor.   3. AFib/flutter: Amiodarone was stopped after the diagnostic cath due to bradycardia. He then had an episode of atrial fibrillation in the hospital. Low-dose amiodarone was restarted. He has not had any symptoms of bradycardia. He will let us know if he has any lightheadedness or presyncope. 4. Hypertension: His blood pressure after exercise can be mildly elevated. Typically, it runs in the 009 systolic range. BP at home has been high.  Systolic in the 233-007 range.   Increase his losartan up to 25 mg daily with an option for a second dose of 25 mg in the evening. He is comfortable staying on the half tablet currently. 5. Insomnia : can try melatonin. 6. Stay away from NSAIDs due to Gi bleeding.  OK to stop aspirin.   Signed, Mina Marble, MD, [facility] 04/13/2014 5:13 PM

## 2014-04-15 DIAGNOSIS — C61 Malignant neoplasm of prostate: Secondary | ICD-10-CM | POA: Diagnosis not present

## 2014-04-29 ENCOUNTER — Encounter: Payer: Self-pay | Admitting: Radiation Oncology

## 2014-04-29 ENCOUNTER — Encounter: Payer: Self-pay | Admitting: Urology

## 2014-04-29 NOTE — Progress Notes (Signed)
Chart note: I discussed Mr. Kokesh's care with Dr. Gaynelle Arabian this morning.  Dr. Era Bumpers tells me that Mr. Portilla is not interested in proceeding with radiation therapy, and he will see him again this June for a follow-up visit along with cystoscopy.  He'll remain on androgen deprivation therapy.  I left a message with the patient to let him know that I more than happy to see me in the future should he want to pursue potentially curative therapy.

## 2014-05-18 ENCOUNTER — Ambulatory Visit: Admission: RE | Admit: 2014-05-18 | Payer: Medicare Other | Source: Ambulatory Visit

## 2014-05-18 ENCOUNTER — Ambulatory Visit: Payer: Medicare Other | Admitting: Radiation Oncology

## 2014-05-19 ENCOUNTER — Ambulatory Visit
Admission: RE | Admit: 2014-05-19 | Discharge: 2014-05-19 | Disposition: A | Discharge status: Home / Self Care | Payer: Medicare Other | Source: Ambulatory Visit | Attending: Radiation Oncology | Admitting: Radiation Oncology

## 2014-05-19 ENCOUNTER — Other Ambulatory Visit: Payer: Self-pay | Admitting: Interventional Cardiology

## 2014-05-19 ENCOUNTER — Encounter: Payer: Self-pay | Admitting: Radiation Oncology

## 2014-05-19 VITALS — BP 156/75 | HR 57 | Temp 97.9°F | Resp 20 | Ht 67.0 in | Wt 187.1 lb

## 2014-05-19 DIAGNOSIS — Z87448 Personal history of other diseases of urinary system: Secondary | ICD-10-CM | POA: Insufficient documentation

## 2014-05-19 DIAGNOSIS — Z96643 Presence of artificial hip joint, bilateral: Secondary | ICD-10-CM | POA: Insufficient documentation

## 2014-05-19 DIAGNOSIS — C61 Malignant neoplasm of prostate: Secondary | ICD-10-CM | POA: Diagnosis not present

## 2014-05-19 NOTE — Progress Notes (Signed)
CC: Dr. Katrine Coho  Follow-up note:  The patient visits today at his request to further investigate radiation therapy in that he is interested in potentially curative treatment.  I first saw the patient consultation on 12/15/2013.  He was seen by Dr. Gaynelle Arabian this past September for further evaluation of nocturia with severe bladder outlet obstruction. He had a TURP in the past and also a TUR of a bladder neck contracture. His I PSS score was 26. A PSA from November 13, 2013 was 4.99 with a percentage free PSA of only 19%. He underwent ultrasound-guided biopsies on 11/30/2013 and he was found to have Gleason 8 (4+4) involving 90% of one core from left lateral mid gland. He also had Gleason 7 (4+3) involving 80% of one core from the left mid gland, 50% of one core from the left lateral apex and 80% of one core from left apex. He Gleason 6 (3+3) involving 80% of one core from the right lateral mid gland and 5% of one core from the right lateral apex. There was perineural invasion. His prostate volume was 21.1 cc. When I saw him back in October his I PSS score was 21.  He was started on Rapaflo and finasteride by Dr. Gaynelle Arabian and also androgen deprivation therapy with a Vantus implant .  His staging workup which included a CT scan and bone scan were without evidence for metastatic disease.  I was concerned about his obstructive symptomatology, but this has gradually improved.  His I PSS score today is 10.  He states that he gets up only once or twice a night where he was getting up hourly.  He has a history of a bladder neck stricture.  He is scheduled to see Dr. Gaynelle Arabian for a follow-up visit in June.    Physical examination: Filed Vitals:   05/19/14 1349  BP: 156/75  Pulse: 57  Temp: 97.9 F (36.6 C)  Resp: 20   Rectal examination not performed today.  Laboratory data: PSA 0.02 from 04/15/2014  Impression: Stage TIc high-risk adenocarcinoma prostate.  I explained to the patient and his  caregiver that one has to weigh the benefits versus the potential toxicities of any treatment.  I did speak with cardiology and they feel that he could certainly have a life expectancy of 5 years.  My initial concern was his obstructive symptomatology which is clearly improved.  His planning may be somewhat tricky because of his bilateral hip replacement, but I think we could still give him good treatment with IMRT.  He would be a good candidate for IMRT with Tomotherapy in view of his hip prosthesis since there would not be significant scatter with megavoltage CT scanning for image guidance.  He understands that he would need to have placement of 3 gold markers for image guidance.  We again discussed the potential acute and late toxicities of radiation therapy.  Of note is that the recently published randomized [place] study including over 1200 men with high-risk disease randomized to androgen deprivation therapy alone or any deprivation therapy plus radiation therapy showed an overall survival benefit, disease-free survival benefit and also biochemical recurrence benefit of for combination radiation along with androgen deprivation therapy compared to androgen deprivation therapy alone.  It may be that we could discontinue his androgen deprivation therapy, with associated cardiac toxicity, once he has undergone definitive radiation therapy.  In any case, I feel he can wait until June to visit with Dr. Gaynelle Arabian to get his thoughts.  If he and  Dr. Gaynelle Arabian agree with external beam/IMRT that we would need to have 3 gold markers placed prior to treatment planning.    Plan: As above.  I've not scheduled the patient for a follow-up visit, by more than happy to see him in the future after seeing Dr. Gaynelle Arabian this June if the patient wants to proceed with external beam/IMRT.  30 minutes was spent face-to-face with the patient, primarily counseling the patient.

## 2014-05-19 NOTE — Progress Notes (Addendum)
FUNC Prostate cancer,  Patient doesn't fully empty bladder, has dysuria when first has to void then pain goes away, ,  bowel movements 1-2 x day with metamucil, low back pain,chronic,  Vitals T97.9, B/P=156/75,P=57,RR=20, room air sats=97%  IPSSscore =10, walks with cane, caregiver Gailen Shelter, with patient 2:05 PM

## 2014-05-19 NOTE — Progress Notes (Signed)
Please see the Nurse Progress Note in the MD Initial Consult Encounter for this patient. 

## 2014-05-20 ENCOUNTER — Encounter: Payer: Self-pay | Admitting: Internal Medicine

## 2014-05-20 ENCOUNTER — Ambulatory Visit (INDEPENDENT_AMBULATORY_CARE_PROVIDER_SITE_OTHER): Payer: Medicare Other | Admitting: Internal Medicine

## 2014-05-20 VITALS — BP 158/66 | HR 83 | Ht 67.5 in | Wt 188.4 lb

## 2014-05-20 DIAGNOSIS — I2510 Atherosclerotic heart disease of native coronary artery without angina pectoris: Secondary | ICD-10-CM

## 2014-05-20 DIAGNOSIS — R001 Bradycardia, unspecified: Secondary | ICD-10-CM

## 2014-05-20 DIAGNOSIS — I5022 Chronic systolic (congestive) heart failure: Secondary | ICD-10-CM | POA: Diagnosis not present

## 2014-05-20 NOTE — Assessment & Plan Note (Signed)
His symptoms are much improved. His EF is now 50%. He is unable to take a beta blocker due to bradycardia. Will follow.

## 2014-05-20 NOTE — Patient Instructions (Signed)
Your physician recommends that you continue on your current medications as directed. Please refer to the Current Medication list given to you today.  Your physician wants you to follow-up in: 12 month with Dr. Lovena Le. You will receive a reminder letter in the mail two months in advance. If you don't receive a letter, please call our office to schedule the follow-up appointment.

## 2014-05-20 NOTE — Progress Notes (Signed)
HPI [name] returns today for followup. I saw him in the hospital several months ago. At that time he developed bradycardia associated with nausea. He undergone a very complex percutaneous intervention. His ejection fraction at that time was 30% but has since improved to 50% after PCI. He has been stable in the interim, with class II symptoms. He denies angina. No peripheral edema. He has shortness of breath with exertion. No syncope.  Allergies  Allergen Reactions  . Codeine Other (See Comments) and Anxiety    Insomnia/ hyper  . Warfarin Sodium Other (See Comments) and Anxiety    Hx gi bleed Hx gi bleed     Current Outpatient Prescriptions  Medication Sig Dispense Refill  . amiodarone (PACERONE) 100 MG tablet Take 1 tablet (100 mg total) by mouth daily. 30 tablet 3  . bicalutamide (CASODEX) 50 MG tablet Take 50 mg by mouth daily.   3  . cholecalciferol (VITAMIN D) 1000 UNITS tablet Take 1,000 Units by mouth daily.    . clopidogrel (PLAVIX) 75 MG tablet TAKE 1 TABLET BY MOUTH EVERY DAY 30 tablet 5  . diazepam (VALIUM) 5 MG tablet Take 5 mg by mouth at bedtime as needed for anxiety or sedation.     . dorzolamide-timolol (COSOPT) 22.3-6.8 MG/ML ophthalmic solution Place 1 drop into both eyes 2 (two) times daily.   2  . finasteride (PROSCAR) 5 MG tablet Take 5 mg by mouth daily.    . fluorouracil (EFUDEX) 5 % cream Apply 1 application topically 2 (two) times daily.    . fluticasone (FLONASE) 50 MCG/ACT nasal spray Place 2 sprays into both nostrils daily.   1  . losartan (COZAAR) 50 MG tablet Take 0.5 tablets (25 mg total) by mouth daily. If Systolic Blood Pressure is greater than 150, you may take one extra 25mg  tab daily 45 tablet 6  . Multiple Vitamin (MULTIVITAMIN) tablet Take 1 tablet by mouth daily.      . nitroGLYCERIN (NITROSTAT) 0.4 MG SL tablet Place 1 tablet (0.4 mg total) under the tongue every 5 (five) minutes as needed for chest pain. 20 tablet 0  . Omega-3 Fatty  Acids (FISH OIL) 1000 MG CAPS Take 1 capsule by mouth daily.    [name] OVER THE COUNTER MEDICATION Take 1 tablet by mouth 2 (two) times daily. Vitamins for eyes    . OVER THE COUNTER MEDICATION Take 2 tablets by mouth daily. [facility] brand for prostate health    . pantoprazole (PROTONIX) 40 MG tablet Take 1 tablet (40 mg total) by mouth daily. 30 tablet 3  . Probiotic Product (PROBIOTIC DAILY PO) Take 1 capsule by mouth daily.    . psyllium (METAMUCIL) 58.6 % packet Take 1 packet by mouth daily as needed (for constipation).    . ranitidine (ZANTAC) 150 MG tablet Take 150-300 mg by mouth 2 (two) times daily as needed for heartburn.     . silodosin (RAPAFLO) 8 MG CAPS capsule Take 8 mg by mouth daily with breakfast.    . tamsulosin (FLOMAX) 0.4 MG CAPS capsule Take 0.4 mg by mouth daily.   0  . traMADol (ULTRAM) 50 MG tablet Take 1 tablet (50 mg total) by mouth every 8 (eight) hours as needed for moderate pain. 60 tablet 0   No current facility-administered medications for this visit.     Past Medical History  Diagnosis Date  . PAF (paroxysmal atrial fibrillation)     controlled w/ amiodarone; not on coumadin due  to hx of GI bleed  . Mitral regurgitation   . GERD (gastroesophageal reflux disease)     occ. take prevacid  . Meningioma right -sided w/ right VI palsy    followed by dr Gaynell Face  . Diverticulitis of colon with bleeding     s/p sigmoid resection '88  . History of GI diverticular bleed april 2012    transfused blood and resolved without surgical intervention  . Impotence, organic     s/p penile prosthesis 1990's  . BPH (benign prostatic hypertrophy) with urinary obstruction     s/p turp yrs ago  . Blood transfusion     "related to a surgery"  . Impaired hearing bilateral     only left hearing aid  . Bradycardia     Amio d/c'd 08/2013; brady arrest 08/2013 after PCI >>> recurrent AF >>> Amiodarone restarted (may need BiV pacer if EF does not recover; consider Holter if + sx's)    . Cardiomyopathy     a. Echo (08/2013):  EF 30-35%, AS hypokinesis, Gr 1 diast dysfn, mild MR, mild LAE >>> b. improved EF 50-55% by echo 8/15  . CAD (coronary artery disease)     a. s/p MI and prior PCI of LAD;  b. LHC (08/2013):  prox LAD 60-70%, mid LAD stents ok, ostial lesion at Dx jailed by stent, mild CFX and RCA disesase >>>  PCI (09/08/13):  rotational atherectomy + Promus DES to prox LAD  . Mitral valve disorders   . Hx of echocardiogram     Echo (8/15):  Mod LVH, EF 50-55%, Gr 1 DD, mild MR, mild RAE  . Prostate cancer 11/30/13    Gleason 8, volume 22.14 cc  . Atrial fibrillation   . Heart murmur     with mitral valve regurgitation  . Anxiety   . Depression   . Hypertension   . CHF (congestive heart failure)   . Myocardial infarction 1980's- medical intervention    "so mild I didn't know I'd had it"  . Sleep apnea     non-compliant cpap  . DJD (degenerative joint disease) hips and knees    s/p bilateral total replacements  . DDD (degenerative disc disease)     chronic back pain  . Arthritis     "I'm covered up w/it"  . Chronic lower back pain   . Incomplete bladder emptying     ROS:   All systems reviewed and negative except as noted in the HPI.   Past Surgical History  Procedure Laterality Date  . Cholecystectomy    . Total hip arthroplasty Right 03-25-08--  . Total hip arthroplasty Left 2005  . Total knee arthroplasty Right 2004  . Total knee arthroplasty Left 1997  . Inguinal hernia repair Bilateral   . Cataract extraction w/ intraocular lens  implant, bilateral Bilateral ~ 2000  . Appendectomy    . Transurethral resection of prostate  "years ago"  . Penile prosthesis implant  1990's  . Shoulder open rotator cuff repair Left   . Inner ear surgery Right yrs ago    "trying to get my hearing back  . Cardiac catheterization  2007    noncritical cad (results w/ chart)  . Coronary angioplasty with stent placement  08-03-08    drug-eluting stent x2 distal and  mid lad  . Transurethral resection of prostate  01/08/2011    Procedure: TRANSURETHRAL RESECTION OF THE PROSTATE (TURP);  Surgeon: Franchot Gallo;  Location: [place];  Service: Urology;  Laterality: N/A;  GYRUS   . Prostate biopsy  11/30/13    Gleason 8, vol 22.14 cc  . Gamma knife radiation  2000    [facility] for meningioma, last eval 2013- no change  . Left heart catheterization with coronary angiogram N/A 09/04/2013    Procedure: LEFT HEART CATHETERIZATION WITH CORONARY ANGIOGRAM;  Surgeon: Jettie Booze, MD;  Location: [facility] CATH LAB;  Service: Cardiovascular;  Laterality: N/A;  . Percutaneous coronary rotoblator intervention (pci-r) N/A 09/08/2013    Procedure: PERCUTANEOUS CORONARY ROTOBLATOR INTERVENTION (PCI-R);  Surgeon: Jettie Booze, MD;  Location: [facility] CATH LAB;  Service: Cardiovascular;  Laterality: N/A;  . Revision total knee arthroplasty Right   . Total hip revision Right 3-4 times  . Closed reduction hip dislocation Right "several"  . Esophagogastroduodenoscopy N/A 02/11/2014    Procedure: ESOPHAGOGASTRODUODENOSCOPY (EGD);  Surgeon: Cleotis Nipper, MD;  Location: [facility] ENDOSCOPY;  Service: Endoscopy;  Laterality: N/A;     Family History  Problem Relation Age of Onset  . Heart disease Mother   . Heart attack Mother   . Cancer    . Emphysema Father   . Cancer Brother     liver cancer     History   Social History  . Marital Status: Widowed    Spouse Name: N/A  . Number of Children: N/A  . Years of Education: N/A   Occupational History  . Not on file.   Social History Main Topics  . Smoking status: Former Smoker -- 1.00 packs/day for 14 years    Types: Cigarettes    Quit date: 01/05/1959  . Smokeless tobacco: Never Used  . Alcohol Use: 0.0 oz/week     Comment: 02/09/2014 "I'm not drinking anymore"  . Drug Use: No  . Sexual Activity: No   Other Topics Concern  . Not on file   Social History Narrative     BP 158/66 mmHg  Pulse  83  Ht 5' 7.5" (1.715 m)  Wt 188 lb 6.4 oz (85.458 kg)  BMI 29.06 kg/m2  Physical Exam:  Well appearing elderly man, NAD HEENT: Unremarkable Neck:  7 cm JVD, no thyromegally Back:  No CVA tenderness Lungs:  Clear with no wheezes, rales, or rhonchi. HEART:  Regular rate rhythm, no murmurs, no rubs, no clicks Abd:  soft, positive bowel sounds, no organomegally, no rebound, no guarding Ext:  2 plus pulses, no edema, no cyanosis, no clubbing Skin:  No rashes no nodules Neuro:  CN II through XII intact, motor grossly intact    Assess/Plan:

## 2014-05-20 NOTE — Assessment & Plan Note (Signed)
He is s/p complex PCI. He is improved. No change in meds.

## 2014-05-20 NOTE — Assessment & Plan Note (Signed)
This has resolved. There is no indication for PPM.

## 2014-05-21 ENCOUNTER — Encounter: Payer: Self-pay | Admitting: *Deleted

## 2014-05-21 NOTE — Progress Notes (Signed)
[place] Psychosocial Distress Screening Clinical Social Work  Clinical Social Work was referred by distress screening protocol.  The patient scored a 7 on the Psychosocial Distress Thermometer which indicates severe distress. Clinical Social Worker phoned pt to assess for distress and other psychosocial needs. Pt's home number is hooked up to fax machine or number is incorrect. CSW phoned pt on his cell and he reports to be doing well and feels less nervous and anxious after talking with the MD. He stated he understood what his plans were and felt like he got all of his questions answered currently. He feels he got his physical concerns addressed as well at his appt. He is familiar with CSW team and Pt and Family Support Team. He agrees to reach out as needed.   ONCBCN DISTRESS SCREENING 05/19/2014  Screening Type Change in Status  Distress experienced in past week (1-10) 7  Emotional problem type Nervousness/Anxiety  Spiritual/Religous concerns type   Information Concerns Type   Physical Problem type Pain;Sleep/insomnia;Getting around;Constipation/diarrhea  Physician notified of physical symptoms Yes  Referral to financial advocate Yes  Other     Clinical Social Worker follow up needed: No.  If yes, follow up plan: See above Loren Racer, [place]  [facility] Phone: [phone] Fax: [phone]

## 2014-06-02 ENCOUNTER — Encounter (INDEPENDENT_AMBULATORY_CARE_PROVIDER_SITE_OTHER): Payer: Medicare Other | Admitting: Ophthalmology

## 2014-06-02 ENCOUNTER — Telehealth: Payer: Self-pay | Admitting: Interventional Cardiology

## 2014-06-02 DIAGNOSIS — I10 Essential (primary) hypertension: Secondary | ICD-10-CM

## 2014-06-02 DIAGNOSIS — H34832 Tributary (branch) retinal vein occlusion, left eye: Secondary | ICD-10-CM | POA: Diagnosis not present

## 2014-06-02 DIAGNOSIS — H43813 Vitreous degeneration, bilateral: Secondary | ICD-10-CM | POA: Diagnosis not present

## 2014-06-02 DIAGNOSIS — H35033 Hypertensive retinopathy, bilateral: Secondary | ICD-10-CM

## 2014-06-02 DIAGNOSIS — H3531 Nonexudative age-related macular degeneration: Secondary | ICD-10-CM | POA: Diagnosis not present

## 2014-06-02 NOTE — Telephone Encounter (Signed)
Left message for [name] to call back. Stent date was 09/08/13.

## 2014-06-02 NOTE — Telephone Encounter (Signed)
New message      Need exact date stent was place.  (the last stent)

## 2014-06-08 DIAGNOSIS — D62 Acute posthemorrhagic anemia: Secondary | ICD-10-CM | POA: Diagnosis not present

## 2014-06-08 DIAGNOSIS — R143 Flatulence: Secondary | ICD-10-CM | POA: Diagnosis not present

## 2014-06-08 DIAGNOSIS — M1990 Unspecified osteoarthritis, unspecified site: Secondary | ICD-10-CM | POA: Diagnosis not present

## 2014-06-08 DIAGNOSIS — R1013 Epigastric pain: Secondary | ICD-10-CM | POA: Diagnosis not present

## 2014-06-08 NOTE — Telephone Encounter (Signed)
Returned Rosie's call and informed her of date.

## 2014-06-10 DIAGNOSIS — Z23 Encounter for immunization: Secondary | ICD-10-CM | POA: Diagnosis not present

## 2014-06-21 ENCOUNTER — Telehealth: Payer: Self-pay | Admitting: Interventional Cardiology

## 2014-06-21 NOTE — Telephone Encounter (Signed)
New message     Pt c/o BP issue: STAT if pt c/o blurred vision, one-sided weakness or slurred speech  1. What are your last 5 BP readings? 87/47   HR 56-----bp up and down for 1 week  2. Are you having any other symptoms (ex. Dizziness, headache, blurred vision, passed out)? Fatigue, little headache  3. What is your BP issue? bp is too low

## 2014-06-21 NOTE — Telephone Encounter (Signed)
Spoke with pt at approximately 9:20 this morning. Pt states that his BP has been up and down for a week and his BP has been low the last 4-5 mornings. This morning BP 87/47, pulse 56. Pt complains of headache, fatigue and dizziness. Pt states that BP is usually low in the mornings and will come up during the day. Pt states that he did take his meds this morning. Pt states that he checked his BP again a few minutes before I called and it was back up to 134/67, pulse 59. Pt provided list of recent BPs- 119/57, 90/47, 116/51, 118/72, 158/49, 120/73 and states that pulse has been between 50-60. Spoke with Truitt Merle, NP and she stated to have pt hold his Losartan the rest of the week and keep a BP diary and call with results and to have just a small amount of salt today since he took his medications with this low BP. Informed pt of this information and told him to call Friday with diary readings. Pt verbalized understanding and was in agreement with this plan.

## 2014-06-22 ENCOUNTER — Other Ambulatory Visit: Payer: Self-pay | Admitting: Internal Medicine

## 2014-06-23 ENCOUNTER — Ambulatory Visit (INDEPENDENT_AMBULATORY_CARE_PROVIDER_SITE_OTHER): Payer: Medicare Other | Admitting: Cardiology

## 2014-06-23 ENCOUNTER — Encounter: Payer: Self-pay | Admitting: *Deleted

## 2014-06-23 ENCOUNTER — Telehealth: Payer: Self-pay | Admitting: Internal Medicine

## 2014-06-23 ENCOUNTER — Encounter (INDEPENDENT_AMBULATORY_CARE_PROVIDER_SITE_OTHER): Payer: Medicare Other

## 2014-06-23 ENCOUNTER — Other Ambulatory Visit: Payer: Self-pay

## 2014-06-23 ENCOUNTER — Encounter: Payer: Self-pay | Admitting: Cardiology

## 2014-06-23 VITALS — BP 122/54 | HR 54 | Ht 68.0 in | Wt 191.2 lb

## 2014-06-23 DIAGNOSIS — R42 Dizziness and giddiness: Secondary | ICD-10-CM

## 2014-06-23 DIAGNOSIS — R079 Chest pain, unspecified: Secondary | ICD-10-CM

## 2014-06-23 DIAGNOSIS — I5022 Chronic systolic (congestive) heart failure: Secondary | ICD-10-CM

## 2014-06-23 DIAGNOSIS — R0602 Shortness of breath: Secondary | ICD-10-CM

## 2014-06-23 DIAGNOSIS — I2510 Atherosclerotic heart disease of native coronary artery without angina pectoris: Secondary | ICD-10-CM | POA: Diagnosis not present

## 2014-06-23 LAB — CBC
HEMATOCRIT: 36.7 % — AB (ref 39.0–52.0)
Hemoglobin: 12.6 g/dL — ABNORMAL LOW (ref 13.0–17.0)
MCHC: 34.3 g/dL (ref 30.0–36.0)
MCV: 93.2 fl (ref 78.0–100.0)
Platelets: 150 10*3/uL (ref 150.0–400.0)
RBC: 3.94 Mil/uL — AB (ref 4.22–5.81)
RDW: 14.5 % (ref 11.5–15.5)
WBC: 5.9 10*3/uL (ref 4.0–10.5)

## 2014-06-23 LAB — BASIC METABOLIC PANEL
BUN: 15 mg/dL (ref 6–23)
CALCIUM: 9.3 mg/dL (ref 8.4–10.5)
CO2: 30 meq/L (ref 19–32)
CREATININE: 1.07 mg/dL (ref 0.40–1.50)
Chloride: 104 mEq/L (ref 96–112)
GFR: 69.68 mL/min (ref >60.00)
GLUCOSE: 104 mg/dL — AB (ref 70–99)
Potassium: 3.9 mEq/L (ref 3.5–5.1)
SODIUM: 136 meq/L (ref 135–145)

## 2014-06-23 NOTE — Addendum Note (Signed)
**Note De-Identified Tomasa Dobransky Obfuscation** Addended by: Dennie Fetters on: 06/23/2014 06:00 PM   Modules accepted: Orders

## 2014-06-23 NOTE — Addendum Note (Signed)
Addended by: Dennie Fetters on: 06/23/2014 05:51 PM   Modules accepted: Orders

## 2014-06-23 NOTE — Telephone Encounter (Signed)
Spoke with pt and he states that he feels like his heart is "quivering". Pt c/o headache, dizziness, blurred vision, SHOB on exertion, and non-radiating chest pain. BP this morning was 152/85, pulse 54. Pt states that he has taken his medications this morning and his noon meds. Pt does not want to go to ER without being seen in our office first. Spoke with Lyda Jester, PA-C and she agreed to see pt today at 2:30pm. Called pt and informed him that I had him scheduled for 2:30pm with Lyda Jester, PA-C. Pt verbalized understanding and was in agreement with this plan.

## 2014-06-23 NOTE — Progress Notes (Signed)
Patient ID: [name], male   DOB: 1926-04-06, 81 y.o.   MRN: [id number] [employer] 30 day cardiac event monitor applied to patient.  Order stated 2 week event monitor.  Since it would be the same charges, explained to patient to call Dr. Irish Lack after 2 weeks if his symptoms had not reoccurred.  Monitor could be worn an additional 2 weeks without additional cost.

## 2014-06-23 NOTE — Patient Instructions (Signed)
Medication Instructions:  Your physician recommends that you continue on your current medications as directed. Please refer to the Current Medication list given to you today.   Labwork: BMET AND CBC   Testing/Procedures: Your physician has requested that you have a lexiscan myoview. For further information please visit [email or web address]. Please follow instruction sheet, as given.  Your physician has recommended that you wear an event monitor. FOR TWO WEEKS per  [facility] TODAY @ 4:30 Event monitors are medical devices that record the heart's electrical activity. Doctors most often [place] these monitors to diagnose arrhythmias. Arrhythmias are problems with the speed or rhythm of the heartbeat. The monitor is a small, portable device. You can wear one while you do your normal daily activities. This is usually used to diagnose what is causing palpitations/syncope (passing out).     Follow-Up:   IN 3 WEEKS ON FLEX SCHEDULE LUKE KILROY 07/14/14   Any Other Special Instructions Will Be Listed Below (If Applicable).

## 2014-06-23 NOTE — Telephone Encounter (Signed)
New message      Pt want to talk to Dr Tanna Furry nurse.  He says he is having a problem with his heart----and would not tell me any more

## 2014-06-23 NOTE — Progress Notes (Signed)
06/23/2014 [name]   1926-05-17  [id number]  Primary Physician No PCP Per Patient Primary Cardiologist: Dr. Irish Lack  Reason for Visit/CC: SOB   HPI:  [name] is a 81 y.o. male with prior AFib, CAD and mitral regurgitation. He most recently had a complex PCI of his LAD in July 2015 involving rotational atherectomy. Prior to the intervention, his ejection fraction was in the 30-35% range. Echocardiogram about a month after the intervention showed an improvement in his EF from 50-55%. In the past, he had a GI bleed with Naprosyn. He has had intermittent bradycardia but no lightheadedness or syncope.For this reason, he is not on BB therapy. He has seen Dr. Lovena Le for his bradycardia. Most recent visit with Dr. Lovena Le was 05/20/14. Due to resolution of his bradycardia, he felt there was no need for PPM. However, patient notes he never wore an ambulatory heart monitor.     He presents to clinic today as an add-on to the Flex schedule. He called the office complaining of multiple issues including a 2 week history of intermittent chest pressure/ indigestion, palpitations, dizziness and mild exertional dyspnea.  No syncope/ near syncope. Also notes feeling cold a lot recently. Denies melena, hematochezia and no other abnormal bleeding. Reports full medication compliance. No recent changes in meds.   In clinic, he is currently asymptomatic.  EKG shows sinus brady with 1st degree AVB and chronic LBBB. HR 54 bpm. BP is 122/54. Orthostatic VS were checked and he is not orthostatic.     Current Outpatient Prescriptions  Medication Sig Dispense Refill  . amiodarone (PACERONE) 100 MG tablet Take 1 tablet (100 mg total) by mouth daily. 30 tablet 3  . bicalutamide (CASODEX) 50 MG tablet Take 50 mg by mouth daily.   3  . clopidogrel (PLAVIX) 75 MG tablet TAKE 1 TABLET BY MOUTH EVERY DAY 30 tablet 5  . dorzolamide-timolol (COSOPT) 22.3-6.8 MG/ML ophthalmic solution Place 1 drop into both eyes  2 (two) times daily.   2  . doxazosin (CARDURA) 4 MG tablet Take 4 mg by mouth daily.  0  . finasteride (PROSCAR) 5 MG tablet Take 5 mg by mouth daily.    . fluticasone (FLONASE) 50 MCG/ACT nasal spray Place 2 sprays into both nostrils daily.   1  . losartan (COZAAR) 50 MG tablet Take 0.5 tablets (25 mg total) by mouth daily. If Systolic Blood Pressure is greater than 150, you may take one extra 25mg  tab daily 45 tablet 6  . Multiple Vitamin (MULTIVITAMIN) tablet Take 1 tablet by mouth daily.      . nitroGLYCERIN (NITROSTAT) 0.4 MG SL tablet Place 1 tablet (0.4 mg total) under the tongue every 5 (five) minutes as needed for chest pain. 20 tablet 0  . OVER THE COUNTER MEDICATION Take 1 tablet by mouth 2 (two) times daily. Vitamins for eyes    . OVER THE COUNTER MEDICATION Take 2 tablets by mouth daily. [facility] brand for prostate health    . Pancrelipase, Lip-Prot-Amyl, (CREON PO) Take by mouth 4 (four) times daily -  with meals and at bedtime. Pt. Not sure of dosage.    . pantoprazole (PROTONIX) 40 MG tablet Take 1 tablet (40 mg total) by mouth daily. 30 tablet 3  . Probiotic Product (PROBIOTIC DAILY PO) Take 1 capsule by mouth daily.    . psyllium (METAMUCIL) 58.6 % packet Take 1 packet by mouth daily as needed (for constipation).    . ranitidine (ZANTAC) 150 MG  tablet Take 150-300 mg by mouth 2 (two) times daily as needed for heartburn.     . silodosin (RAPAFLO) 8 MG CAPS capsule Take 8 mg by mouth daily with breakfast.     No current facility-administered medications for this visit.    Allergies  Allergen Reactions  . Codeine Other (See Comments) and Anxiety    Insomnia/ hyper  . Warfarin Sodium Other (See Comments) and Anxiety    Hx gi bleed Hx gi bleed    History   Social History  . Marital Status: Widowed    Spouse Name: N/A  . Number of Children: N/A  . Years of Education: N/A   Occupational History  . Not on file.   Social History Main Topics  . Smoking status: Former  Smoker -- 1.00 packs/day for 14 years    Types: Cigarettes    Quit date: 01/05/1959  . Smokeless tobacco: Never Used  . Alcohol Use: 0.0 oz/week     Comment: 02/09/2014 "I'm not drinking anymore"  . Drug Use: No  . Sexual Activity: No   Other Topics Concern  . Not on file   Social History Narrative    Family History  Problem Relation Age of Onset  . Heart disease Mother   . Heart attack Mother   . Cancer    . Emphysema Father   . Cancer Brother     liver cancer    Review of Systems: General: negative for chills, fever, night sweats or weight changes.  Cardiovascular: negative for chest pain, dyspnea on exertion, edema, orthopnea, palpitations, paroxysmal nocturnal dyspnea or shortness of breath Dermatological: negative for rash Respiratory: negative for cough or wheezing Urologic: negative for hematuria Abdominal: negative for nausea, vomiting, diarrhea, bright red blood per rectum, melena, or hematemesis Neurologic: negative for visual changes, syncope, or dizziness All other systems reviewed and are otherwise negative except as noted above.    Blood pressure 122/54, pulse 54, height 5\' 8"  (1.727 m), weight 191 lb 3.2 oz (86.728 kg).  General appearance: alert, cooperative and no distress Neck: no carotid bruit and no JVD Lungs: clear to auscultation bilaterally Heart: regular rate and rhythm, S1, S2 normal, no murmur, click, rub or gallop Extremities: no LEE Pulses: 2+ and symmetric Skin: warm and dry Neurologic: Grossly normal  EKG Sinus brady with 1st degree AVB. 54 bpm.   ASSESSMENT AND PLAN:   Given his recent symptoms of palpitations, dizziness, mild DOE and intermittent CP/indigestion, I have recommended the following:   1. 2 week cardiac monitor to assess for recurrent atrial fibrillation and to rule out severe bradycardia/ pauses.   2. NST to rule out ischemia    3. CBC to assess for possible anemia.  PLAN  Continue current meds. F/u in 2-3 weeks  after heart monitor evaluation.   SIMMONS, BRITTAINYPA-C 06/23/2014 2:45 PM

## 2014-06-23 NOTE — Telephone Encounter (Signed)
Pt c/o BP issue:  1. What are your last 5 BP readings? No readings. However he states that it varies a lot up and down 2. Are you having any other symptoms (ex. Dizziness, headache, blurred vision, passed out)? headache sick on the stomach at times and the symptoms persists when he stands up to walk.   3. What is your medication issue? No med issue at this time that he knows of. Req a call back to discuss

## 2014-06-24 DIAGNOSIS — R42 Dizziness and giddiness: Secondary | ICD-10-CM | POA: Diagnosis not present

## 2014-06-30 ENCOUNTER — Telehealth (HOSPITAL_COMMUNITY): Payer: Self-pay | Admitting: *Deleted

## 2014-06-30 ENCOUNTER — Telehealth: Payer: Self-pay | Admitting: *Deleted

## 2014-06-30 NOTE — Telephone Encounter (Signed)
-----   Message from Consuelo Pandy, [place] sent at 06/24/2014  3:33 PM EDT ----- CBC shows stable chronic anemia. Not severe. Actually better than 4 months ago. Doubtful this level is causing his symptoms. Please notify patient.

## 2014-06-30 NOTE — Telephone Encounter (Signed)
Left message on voicemail in reference to upcoming appointment scheduled for 06/30/14. Phone number given for a call back so details instructions can be given. Crissie Figures, RN

## 2014-07-02 ENCOUNTER — Ambulatory Visit (HOSPITAL_COMMUNITY): Payer: Medicare Other | Attending: Cardiology

## 2014-07-02 VITALS — Ht 68.0 in | Wt 192.0 lb

## 2014-07-02 DIAGNOSIS — I10 Essential (primary) hypertension: Secondary | ICD-10-CM | POA: Diagnosis not present

## 2014-07-02 DIAGNOSIS — R079 Chest pain, unspecified: Secondary | ICD-10-CM | POA: Insufficient documentation

## 2014-07-02 DIAGNOSIS — R0609 Other forms of dyspnea: Secondary | ICD-10-CM | POA: Insufficient documentation

## 2014-07-02 DIAGNOSIS — R9439 Abnormal result of other cardiovascular function study: Secondary | ICD-10-CM | POA: Insufficient documentation

## 2014-07-02 DIAGNOSIS — I2510 Atherosclerotic heart disease of native coronary artery without angina pectoris: Secondary | ICD-10-CM | POA: Insufficient documentation

## 2014-07-02 DIAGNOSIS — I447 Left bundle-branch block, unspecified: Secondary | ICD-10-CM | POA: Diagnosis not present

## 2014-07-02 DIAGNOSIS — R002 Palpitations: Secondary | ICD-10-CM | POA: Diagnosis not present

## 2014-07-02 DIAGNOSIS — I4891 Unspecified atrial fibrillation: Secondary | ICD-10-CM | POA: Diagnosis not present

## 2014-07-02 LAB — MYOCARDIAL PERFUSION IMAGING
CHL CUP RESTING HR STRESS: 41 {beats}/min
CSEPPHR: 64 {beats}/min
LV dias vol: 165 mL
LVSYSVOL: 96 mL
NUC STRESS EF: 42 %
NUC STRESS TID: 1.07
RATE: 0.34
SDS: 1
SRS: 6
SSS: 7

## 2014-07-02 MED ORDER — REGADENOSON 0.4 MG/5ML IV SOLN
0.4000 mg | Freq: Once | INTRAVENOUS | Status: AC
  Administered 2014-07-02: 0.4 mg via INTRAVENOUS

## 2014-07-02 MED ORDER — TECHNETIUM TC 99M SESTAMIBI GENERIC - CARDIOLITE
33.0000 | Freq: Once | INTRAVENOUS | Status: AC | PRN
  Administered 2014-07-02: 33 via INTRAVENOUS

## 2014-07-02 MED ORDER — TECHNETIUM TC 99M SESTAMIBI GENERIC - CARDIOLITE
10.0000 | Freq: Once | INTRAVENOUS | Status: AC | PRN
  Administered 2014-07-02: 10 via INTRAVENOUS

## 2014-07-09 ENCOUNTER — Other Ambulatory Visit: Payer: Self-pay | Admitting: Internal Medicine

## 2014-07-12 DIAGNOSIS — H01024 Squamous blepharitis left upper eyelid: Secondary | ICD-10-CM | POA: Diagnosis not present

## 2014-07-12 DIAGNOSIS — H01021 Squamous blepharitis right upper eyelid: Secondary | ICD-10-CM | POA: Diagnosis not present

## 2014-07-12 DIAGNOSIS — H4011X0 Primary open-angle glaucoma, stage unspecified: Secondary | ICD-10-CM | POA: Diagnosis not present

## 2014-07-12 DIAGNOSIS — H01025 Squamous blepharitis left lower eyelid: Secondary | ICD-10-CM | POA: Diagnosis not present

## 2014-07-12 DIAGNOSIS — H01022 Squamous blepharitis right lower eyelid: Secondary | ICD-10-CM | POA: Diagnosis not present

## 2014-07-14 ENCOUNTER — Encounter: Payer: Self-pay | Admitting: Cardiology

## 2014-07-14 ENCOUNTER — Ambulatory Visit (INDEPENDENT_AMBULATORY_CARE_PROVIDER_SITE_OTHER): Payer: Medicare Other | Admitting: Cardiology

## 2014-07-14 ENCOUNTER — Telehealth: Payer: Self-pay | Admitting: Cardiology

## 2014-07-14 VITALS — BP 144/62 | HR 42 | Ht 68.0 in | Wt 192.8 lb

## 2014-07-14 DIAGNOSIS — I480 Paroxysmal atrial fibrillation: Secondary | ICD-10-CM | POA: Diagnosis not present

## 2014-07-14 DIAGNOSIS — I5022 Chronic systolic (congestive) heart failure: Secondary | ICD-10-CM | POA: Diagnosis not present

## 2014-07-14 DIAGNOSIS — R0602 Shortness of breath: Secondary | ICD-10-CM

## 2014-07-14 DIAGNOSIS — I2510 Atherosclerotic heart disease of native coronary artery without angina pectoris: Secondary | ICD-10-CM

## 2014-07-14 NOTE — Assessment & Plan Note (Signed)
Myoview low risk 

## 2014-07-14 NOTE — Patient Instructions (Signed)
Medication Instructions:  Your physician recommends that you continue on your current medications as directed. Please refer to the Current Medication list given to you today.  Labwork: NONE  Testing/Procedures: NONE  Follow-Up: Your physician recommends that you schedule a follow-up appointment as needed.  Any Other Special Instructions Will Be Listed Below (If Applicable).

## 2014-07-14 NOTE — Telephone Encounter (Signed)
I spoke with [name] and relayed Dr Tanna Furry recommendation for no pacemaker at this time. He appreciated the call back and understands our recommendations. He is OK from our stanpoint to have "laser surgery" on his back under light anesthesia once he is a year out from his stent. He wants to have this done in [place] PA-C 07/14/2014 1:36 PM

## 2014-07-14 NOTE — Progress Notes (Signed)
07/14/2014 [name]   Sep 04, 1926  [id number]  Primary Physician No PCP Per Patient Primary Cardiologist: Dr [name] Dr [name]  HPI:  81 y.o. male with CAF, NSR on Amiodarone, bradycardia, LBBB,past GI bleed, and CAD. He most recently had a complex PCI of his LAD in July 2015 involving rotational atherectomy and DES. Prior to the intervention, his ejection fraction was in the 30-35% range. If his EF had remained depressed Dr [name] was going to place a BiV pacemaker. Echocardiogram about a month after the intervention showed an improvement in his EF from 50-55%. He was seen recently by [name] in the flex clinic for atypical chest pain. A Myoview as done that showed an EF of 30-35% but no ischemia. He is also noted to have sinus bradycardia with HR in the 40's. Its hard to tell if he is symptomatic from this. He has lots of symptoms and anxiety. He has chronic back pain and DJD that does limit his activity.    Current Outpatient Prescriptions  Medication Sig Dispense Refill  . amiodarone (PACERONE) 100 MG tablet Take 1 tablet (100 mg total) by mouth daily. 30 tablet 3  . bicalutamide (CASODEX) 50 MG tablet Take 50 mg by mouth daily.   3  . clopidogrel (PLAVIX) 75 MG tablet TAKE 1 TABLET BY MOUTH EVERY DAY 30 tablet 5  . dorzolamide-timolol (COSOPT) 22.3-6.8 MG/ML ophthalmic solution Place 1 drop into both eyes 2 (two) times daily.   2  . doxazosin (CARDURA) 4 MG tablet Take 4 mg by mouth daily.  0  . finasteride (PROSCAR) 5 MG tablet Take 5 mg by mouth daily.    . fluticasone (FLONASE) 50 MCG/ACT nasal spray Place 2 sprays into both nostrils daily.   1  . losartan (COZAAR) 50 MG tablet Take 0.5 tablets (25 mg total) by mouth daily. If Systolic Blood Pressure is greater than 150, you may take one extra 25mg  tab daily 45 tablet 6  . Multiple Vitamin (MULTIVITAMIN) tablet Take 1 tablet by mouth daily.      . nitroGLYCERIN (NITROSTAT) 0.4 MG SL tablet Place 1 tablet (0.4 mg total)  under the tongue every 5 (five) minutes as needed for chest pain. 20 tablet 0  . OVER THE COUNTER MEDICATION Take 1 tablet by mouth 2 (two) times daily. Vitamins for eyes    . OVER THE COUNTER MEDICATION Take 2 tablets by mouth daily. [facility] brand for prostate health    . Pancrelipase, Lip-Prot-Amyl, (CREON PO) Take by mouth 4 (four) times daily -  with meals and at bedtime. Pt. Not sure of dosage.    . pantoprazole (PROTONIX) 40 MG tablet Take 1 tablet (40 mg total) by mouth daily. 30 tablet 3  . Probiotic Product (PROBIOTIC DAILY PO) Take 1 capsule by mouth daily.    . psyllium (METAMUCIL) 58.6 % packet Take 1 packet by mouth daily as needed (for constipation).    . ranitidine (ZANTAC) 150 MG tablet Take 150-300 mg by mouth 2 (two) times daily as needed for heartburn.     . silodosin (RAPAFLO) 8 MG CAPS capsule Take 8 mg by mouth daily with breakfast.     No current facility-administered medications for this visit.    Allergies  Allergen Reactions  . Codeine Other (See Comments) and Anxiety    Insomnia/ hyper  . Warfarin Sodium Other (See Comments) and Anxiety    Hx gi bleed Hx gi bleed    History   Social History  .  Marital Status: Widowed    Spouse Name: N/A  . Number of Children: N/A  . Years of Education: N/A   Occupational History  . Not on file.   Social History Main Topics  . Smoking status: Former Smoker -- 1.00 packs/day for 14 years    Types: Cigarettes    Quit date: 01/05/1959  . Smokeless tobacco: Never Used  . Alcohol Use: 0.0 oz/week     Comment: 02/09/2014 "I'm not drinking anymore"  . Drug Use: No  . Sexual Activity: No   Other Topics Concern  . Not on file   Social History Narrative     Review of Systems: General: negative for chills, fever, night sweats or weight changes.  Cardiovascular: negative for chest pain, dyspnea on exertion, edema, orthopnea, palpitations, paroxysmal nocturnal dyspnea or shortness of breath Dermatological: negative  for rash Respiratory: negative for cough or wheezing Urologic: negative for hematuria Abdominal: negative for nausea, vomiting, diarrhea, bright red blood per rectum, melena, or hematemesis Neurologic: negative for visual changes, syncope, pre syncope,or dizziness All other systems reviewed and are otherwise negative except as noted above.    Blood pressure 144/62, pulse 42, height 5\' 8"  (1.727 m), weight 192 lb 12.8 oz (87.454 kg), SpO2 97 %.  General appearance: alert, cooperative and no distress Neck: no carotid bruit and no JVD Lungs: clear to auscultation bilaterally Heart: regular rate and rhythm and slow rate Extremities: no edema  EKG NSR, SB,LBBB, 1st degree AVB  ASSESSMENT AND PLAN:   Shortness of breath Noted increased SOB with exertion   Coronary artery disease involving native coronary artery of native heart without angina pectoris Myoview low risk   PAF (paroxysmal atrial fibrillation) SSS- sinus brady today    PLAN  I reviewed [name]'s case with Dr [name]. He has no CHF on exam and has no clear history of symptomatic bradycardia. Dr [name] does not recommend a pacemaker at this time. If the pt has clear cut symptoms of bradycardia- syncope or near syncope we could consider it then. He does not want to place and ICD in this 81 y/o but would consider a BiV pacemaker if his LVF was confirmed to be significantly depressed by echo.   [name] KPA-C 07/14/2014 12:05 PM

## 2014-07-14 NOTE — Assessment & Plan Note (Signed)
Noted increased SOB with exertion

## 2014-07-14 NOTE — Assessment & Plan Note (Signed)
SSS- sinus brady today

## 2014-07-27 DIAGNOSIS — C61 Malignant neoplasm of prostate: Secondary | ICD-10-CM | POA: Diagnosis not present

## 2014-08-02 DIAGNOSIS — E291 Testicular hypofunction: Secondary | ICD-10-CM | POA: Diagnosis not present

## 2014-08-02 DIAGNOSIS — C61 Malignant neoplasm of prostate: Secondary | ICD-10-CM | POA: Diagnosis not present

## 2014-08-02 DIAGNOSIS — N5201 Erectile dysfunction due to arterial insufficiency: Secondary | ICD-10-CM | POA: Diagnosis not present

## 2014-08-06 DIAGNOSIS — I5020 Unspecified systolic (congestive) heart failure: Secondary | ICD-10-CM | POA: Diagnosis not present

## 2014-08-06 DIAGNOSIS — E669 Obesity, unspecified: Secondary | ICD-10-CM | POA: Diagnosis not present

## 2014-08-06 DIAGNOSIS — I480 Paroxysmal atrial fibrillation: Secondary | ICD-10-CM | POA: Diagnosis not present

## 2014-08-06 DIAGNOSIS — F419 Anxiety disorder, unspecified: Secondary | ICD-10-CM | POA: Diagnosis not present

## 2014-08-06 DIAGNOSIS — I447 Left bundle-branch block, unspecified: Secondary | ICD-10-CM | POA: Diagnosis not present

## 2014-08-06 DIAGNOSIS — Z1389 Encounter for screening for other disorder: Secondary | ICD-10-CM | POA: Diagnosis not present

## 2014-08-06 DIAGNOSIS — C61 Malignant neoplasm of prostate: Secondary | ICD-10-CM | POA: Diagnosis not present

## 2014-08-06 DIAGNOSIS — Z6831 Body mass index (BMI) 31.0-31.9, adult: Secondary | ICD-10-CM | POA: Diagnosis not present

## 2014-08-06 DIAGNOSIS — M545 Low back pain: Secondary | ICD-10-CM | POA: Diagnosis not present

## 2014-08-06 DIAGNOSIS — I25118 Atherosclerotic heart disease of native coronary artery with other forms of angina pectoris: Secondary | ICD-10-CM | POA: Diagnosis not present

## 2014-08-06 DIAGNOSIS — N401 Enlarged prostate with lower urinary tract symptoms: Secondary | ICD-10-CM | POA: Diagnosis not present

## 2014-08-16 ENCOUNTER — Telehealth: Payer: Self-pay

## 2014-08-16 NOTE — Telephone Encounter (Signed)
The pt is advised, per Dr Irish Lack, that his event monitor results revealed no pauses, no a-fib and Dr Irish Lack wants him to continue his current medications. The pt verbalized understanding and agrees with plan.

## 2014-08-27 ENCOUNTER — Other Ambulatory Visit: Payer: Self-pay

## 2014-08-27 ENCOUNTER — Other Ambulatory Visit: Payer: Self-pay | Admitting: Interventional Cardiology

## 2014-08-27 MED ORDER — CLOPIDOGREL BISULFATE 75 MG PO TABS: 75.0000 mg | ORAL_TABLET | Freq: Every day | ORAL | Status: DC

## 2014-09-02 DIAGNOSIS — Z79899 Other long term (current) drug therapy: Secondary | ICD-10-CM | POA: Diagnosis not present

## 2014-09-02 DIAGNOSIS — C61 Malignant neoplasm of prostate: Secondary | ICD-10-CM | POA: Diagnosis not present

## 2014-09-02 DIAGNOSIS — I480 Paroxysmal atrial fibrillation: Secondary | ICD-10-CM | POA: Diagnosis not present

## 2014-09-02 DIAGNOSIS — I5020 Unspecified systolic (congestive) heart failure: Secondary | ICD-10-CM | POA: Diagnosis not present

## 2014-09-07 DIAGNOSIS — I25118 Atherosclerotic heart disease of native coronary artery with other forms of angina pectoris: Secondary | ICD-10-CM | POA: Diagnosis not present

## 2014-09-07 DIAGNOSIS — F419 Anxiety disorder, unspecified: Secondary | ICD-10-CM | POA: Diagnosis not present

## 2014-09-07 DIAGNOSIS — E785 Hyperlipidemia, unspecified: Secondary | ICD-10-CM | POA: Diagnosis not present

## 2014-09-07 DIAGNOSIS — M545 Low back pain: Secondary | ICD-10-CM | POA: Diagnosis not present

## 2014-09-07 DIAGNOSIS — Z0000 Encounter for general adult medical examination without abnormal findings: Secondary | ICD-10-CM | POA: Diagnosis not present

## 2014-09-07 DIAGNOSIS — I480 Paroxysmal atrial fibrillation: Secondary | ICD-10-CM | POA: Diagnosis not present

## 2014-09-07 DIAGNOSIS — C61 Malignant neoplasm of prostate: Secondary | ICD-10-CM | POA: Diagnosis not present

## 2014-09-07 DIAGNOSIS — R740 Nonspecific elevation of levels of transaminase and lactic acid dehydrogenase [LDH]: Secondary | ICD-10-CM | POA: Diagnosis not present

## 2014-09-07 DIAGNOSIS — Z6831 Body mass index (BMI) 31.0-31.9, adult: Secondary | ICD-10-CM | POA: Diagnosis not present

## 2014-09-07 DIAGNOSIS — I340 Nonrheumatic mitral (valve) insufficiency: Secondary | ICD-10-CM | POA: Diagnosis not present

## 2014-09-07 DIAGNOSIS — N401 Enlarged prostate with lower urinary tract symptoms: Secondary | ICD-10-CM | POA: Diagnosis not present

## 2014-09-07 DIAGNOSIS — I10 Essential (primary) hypertension: Secondary | ICD-10-CM | POA: Diagnosis not present

## 2014-09-08 DIAGNOSIS — Z1212 Encounter for screening for malignant neoplasm of rectum: Secondary | ICD-10-CM | POA: Diagnosis not present

## 2014-09-10 ENCOUNTER — Other Ambulatory Visit: Payer: Self-pay | Admitting: Internal Medicine

## 2014-09-10 ENCOUNTER — Telehealth: Payer: Self-pay | Admitting: Interventional Cardiology

## 2014-09-10 NOTE — Telephone Encounter (Signed)
New Message      Office calling stating that they faxed a med rec request and a surgical clearance form for pt on 09/08/14. They want to know if we have received it. Please call back and advise.

## 2014-09-10 NOTE — Telephone Encounter (Signed)
I left a message on Rosie's voice mail that we received the form, but Dr. Irish Lack is not back until 7/20.  We will send back after MD review.

## 2014-09-12 ENCOUNTER — Other Ambulatory Visit: Payer: Self-pay | Admitting: Internal Medicine

## 2014-09-17 ENCOUNTER — Telehealth: Payer: Self-pay | Admitting: Interventional Cardiology

## 2014-09-17 NOTE — Telephone Encounter (Signed)
**Note De-Identified  Obfuscation** I left a detailed message for [facility] at ext # provided that Dr Irish Lack is not in the office at this time and that I am forwarding this message to him for his recommendation.

## 2014-09-17 NOTE — Telephone Encounter (Signed)
Request for surgical clearance:  1. What type of surgery is being performed? Back surgery   2. When is this surgery scheduled? Not scheduled   3. Are there any medications that need to be held prior to surgery and how long?Requesting to hold Plavix 5 days prior   4. Name of physician performing surgery? Unknown at this time   5. What is your office phone and fax number? Phone- [phone] Fax- [phone]

## 2014-09-19 ENCOUNTER — Other Ambulatory Visit: Payer: Self-pay | Admitting: Interventional Cardiology

## 2014-09-20 ENCOUNTER — Other Ambulatory Visit: Payer: Self-pay

## 2014-09-20 MED ORDER — AMIODARONE HCL 100 MG PO TABS: 100.0000 mg | ORAL_TABLET | Freq: Every day | ORAL | Status: DC

## 2014-09-20 NOTE — Telephone Encounter (Signed)
Follow up     Office calling back checking the status of surgical clearance

## 2014-09-20 NOTE — Telephone Encounter (Signed)
I left a message for Rosey that at this time Dr Irish Lack has not responded to surgical clearance request. Dr Irish Lack will be in the office tomorrow afternoon and we will address clearance at that time.

## 2014-09-22 NOTE — Telephone Encounter (Signed)
Faxed to new number.

## 2014-09-22 NOTE — Telephone Encounter (Signed)
Done. He is somewhat frail, but otherwise medically optimized for surgery.  Risks are higher due to his age and h/o CAD.  Form completed on 7/26

## 2014-09-22 NOTE — Telephone Encounter (Signed)
Follow up      Please refax clearance to a new fax number----[phone] attn Gaby.

## 2014-10-07 DIAGNOSIS — I2510 Atherosclerotic heart disease of native coronary artery without angina pectoris: Secondary | ICD-10-CM | POA: Diagnosis not present

## 2014-10-07 DIAGNOSIS — M438X6 Other specified deforming dorsopathies, lumbar region: Secondary | ICD-10-CM | POA: Diagnosis not present

## 2014-10-07 DIAGNOSIS — M47816 Spondylosis without myelopathy or radiculopathy, lumbar region: Secondary | ICD-10-CM | POA: Diagnosis not present

## 2014-10-07 DIAGNOSIS — M8580 Other specified disorders of bone density and structure, unspecified site: Secondary | ICD-10-CM | POA: Diagnosis not present

## 2014-10-07 DIAGNOSIS — M549 Dorsalgia, unspecified: Secondary | ICD-10-CM | POA: Diagnosis not present

## 2014-10-07 DIAGNOSIS — M4806 Spinal stenosis, lumbar region: Secondary | ICD-10-CM | POA: Diagnosis not present

## 2014-10-07 DIAGNOSIS — M40204 Unspecified kyphosis, thoracic region: Secondary | ICD-10-CM | POA: Diagnosis not present

## 2014-10-07 DIAGNOSIS — M4056 Lordosis, unspecified, lumbar region: Secondary | ICD-10-CM | POA: Diagnosis not present

## 2014-10-07 DIAGNOSIS — M545 Low back pain: Secondary | ICD-10-CM | POA: Diagnosis not present

## 2014-10-07 DIAGNOSIS — Z9049 Acquired absence of other specified parts of digestive tract: Secondary | ICD-10-CM | POA: Diagnosis not present

## 2014-10-15 DIAGNOSIS — Z1389 Encounter for screening for other disorder: Secondary | ICD-10-CM | POA: Diagnosis not present

## 2014-10-15 DIAGNOSIS — R0609 Other forms of dyspnea: Secondary | ICD-10-CM | POA: Diagnosis not present

## 2014-10-15 DIAGNOSIS — I2510 Atherosclerotic heart disease of native coronary artery without angina pectoris: Secondary | ICD-10-CM | POA: Diagnosis not present

## 2014-10-15 DIAGNOSIS — Z01818 Encounter for other preprocedural examination: Secondary | ICD-10-CM | POA: Diagnosis not present

## 2014-10-15 DIAGNOSIS — Z8719 Personal history of other diseases of the digestive system: Secondary | ICD-10-CM | POA: Diagnosis not present

## 2014-10-15 DIAGNOSIS — G3184 Mild cognitive impairment, so stated: Secondary | ICD-10-CM | POA: Diagnosis not present

## 2014-10-15 DIAGNOSIS — R296 Repeated falls: Secondary | ICD-10-CM | POA: Diagnosis not present

## 2014-10-15 DIAGNOSIS — M4806 Spinal stenosis, lumbar region: Secondary | ICD-10-CM | POA: Diagnosis not present

## 2014-10-15 DIAGNOSIS — M545 Low back pain: Secondary | ICD-10-CM | POA: Diagnosis not present

## 2014-10-15 DIAGNOSIS — G8929 Other chronic pain: Secondary | ICD-10-CM | POA: Diagnosis not present

## 2014-11-10 ENCOUNTER — Encounter (INDEPENDENT_AMBULATORY_CARE_PROVIDER_SITE_OTHER): Payer: Medicare Other | Admitting: Ophthalmology

## 2014-11-23 ENCOUNTER — Other Ambulatory Visit: Payer: Self-pay | Admitting: Physical Medicine and Rehabilitation

## 2014-11-23 DIAGNOSIS — M4806 Spinal stenosis, lumbar region: Secondary | ICD-10-CM | POA: Diagnosis not present

## 2014-11-23 DIAGNOSIS — M5136 Other intervertebral disc degeneration, lumbar region: Secondary | ICD-10-CM | POA: Diagnosis not present

## 2014-11-23 DIAGNOSIS — M5416 Radiculopathy, lumbar region: Secondary | ICD-10-CM | POA: Diagnosis not present

## 2014-11-30 ENCOUNTER — Encounter: Payer: Self-pay | Admitting: Internal Medicine

## 2014-11-30 ENCOUNTER — Ambulatory Visit (INDEPENDENT_AMBULATORY_CARE_PROVIDER_SITE_OTHER): Payer: Medicare Other | Admitting: Internal Medicine

## 2014-11-30 ENCOUNTER — Other Ambulatory Visit (INDEPENDENT_AMBULATORY_CARE_PROVIDER_SITE_OTHER): Payer: Medicare Other

## 2014-11-30 VITALS — BP 130/64 | HR 56 | Ht 68.0 in | Wt 199.2 lb

## 2014-11-30 DIAGNOSIS — R0600 Dyspnea, unspecified: Secondary | ICD-10-CM | POA: Diagnosis not present

## 2014-11-30 DIAGNOSIS — I2510 Atherosclerotic heart disease of native coronary artery without angina pectoris: Secondary | ICD-10-CM | POA: Diagnosis not present

## 2014-11-30 LAB — BASIC METABOLIC PANEL
BUN: 18 mg/dL (ref 6–23)
CALCIUM: 9.5 mg/dL (ref 8.4–10.5)
CO2: 28 mEq/L (ref 19–32)
Chloride: 100 mEq/L (ref 96–112)
Creatinine, Ser: 1.24 mg/dL (ref 0.40–1.50)
GFR: 58.72 mL/min — AB (ref >60.00)
GLUCOSE: 98 mg/dL (ref 70–99)
POTASSIUM: 4.6 meq/L (ref 3.5–5.1)
Sodium: 135 mEq/L (ref 135–145)

## 2014-11-30 LAB — CBC WITH DIFFERENTIAL/PLATELET
BASOS ABS: 0 10*3/uL (ref 0.0–0.1)
Basophils Relative: 0.2 % (ref 0.0–3.0)
Eosinophils Absolute: 0.2 10*3/uL (ref 0.0–0.7)
Eosinophils Relative: 3.6 % (ref 0.0–5.0)
HCT: 38 % — ABNORMAL LOW (ref 39.0–52.0)
Hemoglobin: 13 g/dL (ref 13.0–17.0)
LYMPHS ABS: 1.8 10*3/uL (ref 0.7–4.0)
Lymphocytes Relative: 27.6 % (ref 12.0–46.0)
MCHC: 34.3 g/dL (ref 30.0–36.0)
MCV: 95.8 fl (ref 78.0–100.0)
MONO ABS: 0.8 10*3/uL (ref 0.1–1.0)
Monocytes Relative: 11.7 % (ref 3.0–12.0)
NEUTROS ABS: 3.8 10*3/uL (ref 1.4–7.7)
NEUTROS PCT: 56.9 % (ref 43.0–77.0)
PLATELETS: 149 10*3/uL — AB (ref 150.0–400.0)
RBC: 3.97 Mil/uL — AB (ref 4.22–5.81)
RDW: 14.2 % (ref 11.5–15.5)
WBC: 6.6 10*3/uL (ref 4.0–10.5)

## 2014-11-30 LAB — SEDIMENTATION RATE: Sed Rate: 6 mm/hr (ref 0–22)

## 2014-11-30 LAB — TSH: TSH: 1.7 u[IU]/mL (ref 0.35–4.50)

## 2014-11-30 LAB — BRAIN NATRIURETIC PEPTIDE: PRO B NATRI PEPTIDE: 150 pg/mL — AB (ref 0.0–100.0)

## 2014-11-30 NOTE — Patient Instructions (Addendum)
Please remember to go to the lab department downstairs for your tests - we will call you with the results when they are available.  Continue water exercise at the y   Prilosec 20 mg   Take  30-60 min before first meal of the day and ranitidine 150 mg one @  bedtime until return to office - this is the best way to tell whether stomach acid is contributing to your problem.     GERD (REFLUX)  is an extremely common cause of respiratory symptoms just like yours , many times with no obvious heartburn at all.    It can be treated with medication, but also with lifestyle changes including elevation of the head of your bed (ideally with 6 inch  bed blocks),  Smoking cessation, avoidance of late meals, excessive alcohol, and avoid fatty foods, chocolate, peppermint, colas, red wine, and acidic juices such as orange juice.  NO MINT OR MENTHOL PRODUCTS SO NO COUGH DROPS  USE SUGARLESS CANDY INSTEAD (Jolley ranchers or Stover's or Life Savers) or even ice chips will also do - the key is to swallow to prevent all throat clearing. NO OIL BASED VITAMINS - use powdered substitutes.        Please schedule a follow up office visit in 2 weeks, sooner if needed with cxr on return

## 2014-11-30 NOTE — Progress Notes (Signed)
Subjective:    Patient ID: [name], male    DOB: 1926/07/06,    MRN: [id number]  HPI   52 yowm quit smoking 1962 due to cough which resolved self referred for sob x fall 2015    11/30/2014 1st [facility] office visit/ Shawnique Mariotti   Chief Complaint  Patient presents with  . Pulmonary Consult    Self referral. Pt states suffering from extreme SOB all the time, producitive cough with white mucus, chest pain, back pain, and wheezing. Denies fever, hemptoysis, edema   indolent onset doe since early fall  of 2015 progressive to point where no able to walk to mb assoc with 25 lb wt gain,no resting or supine symptoms - did fine in the pool  With ex  But otherwise can't exert at all/ also worse bending over at waste.  No obvious other patterns in day to day or daytime variabilty or assoc chronic cough or classically pleuritic or ex cp or chest tightness, subjective wheeze overt sinus or hb symptoms. No unusual exp hx or h/o childhood pna/ asthma or knowledge of premature birth.  Sleeping ok without nocturnal  or early am exacerbation  of respiratory  c/o's or need for noct saba. Also denies any obvious fluctuation of symptoms with weather or environmental changes or other aggravating or alleviating factors except as outlined above   Current Medications, Allergies, Complete Past Medical History, Past Surgical History, Family History, and Social History were reviewed in [employer] record.             Review of Systems  Constitutional: Positive for appetite change. Negative for fever, chills, activity change and unexpected weight change.  HENT: Negative for congestion, dental problem, postnasal drip, rhinorrhea, sneezing, sore throat, trouble swallowing and voice change.   Eyes: Negative for visual disturbance.  Respiratory: Positive for cough, shortness of breath and wheezing. Negative for choking.   Cardiovascular: Positive for chest pain. Negative for leg  swelling.  Gastrointestinal: Negative for nausea, vomiting and abdominal pain.  Genitourinary: Negative for difficulty urinating.  Musculoskeletal: Negative for arthralgias.  Skin: Negative for rash.  Psychiatric/Behavioral: Negative for behavioral problems and confusion.       Objective:   Physical Exam  Pleasant amb wm nad  Wt Readings from Last 3 Encounters:  11/30/14 199 lb 3.2 oz (90.357 kg)  07/14/14 192 lb 12.8 oz (87.454 kg)  07/02/14 192 lb (87.091 kg)    Vital signs reviewed   HEENT: nl dentition, turbinates, and orophanx. Nl external ear canals without cough reflex   NECK :  without JVD/Nodes/TM/ nl carotid upstrokes bilaterally   LUNGS: no acc muscle use, clear to A and P bilaterally without cough on insp or exp maneuvers   CV:  RRR  no s3 or murmur or increase in P2, no edema   ABD:  soft and nontender with pos early insp Hoover's in supine position.  No bruits or organomegaly, bowel sounds nl  MS:  warm without deformities, calf tenderness, cyanosis or clubbing  SKIN: warm and dry without lesions    NEURO:  alert, approp, no deficits   Labs ordered/ reviewed:   Lab 11/30/14 1604  NA 135  K 4.6  CL 100  CO2 28  BUN 18  CREATININE 1.24  GLUCOSE 98      Lab 11/30/14 1604  HGB 13.0  HCT 38.0*  WBC 6.6  PLT 149.0*     Lab Results  Component Value Date   TSH  1.70 11/30/2014     Lab Results  Component Value Date   PROBNP 150.0* 11/30/2014       Lab Results  Component Value Date   ESRSEDRATE 6 11/30/2014                  Assessment & Plan:

## 2014-12-01 ENCOUNTER — Encounter: Payer: Self-pay | Admitting: Internal Medicine

## 2014-12-01 ENCOUNTER — Ambulatory Visit
Admission: RE | Admit: 2014-12-01 | Discharge: 2014-12-01 | Disposition: A | Discharge status: Home / Self Care | Payer: Medicare Other | Source: Ambulatory Visit | Attending: Physical Medicine and Rehabilitation | Admitting: Physical Medicine and Rehabilitation

## 2014-12-01 DIAGNOSIS — M5136 Other intervertebral disc degeneration, lumbar region: Secondary | ICD-10-CM | POA: Diagnosis not present

## 2014-12-01 DIAGNOSIS — M4806 Spinal stenosis, lumbar region: Secondary | ICD-10-CM | POA: Diagnosis not present

## 2014-12-01 DIAGNOSIS — M5416 Radiculopathy, lumbar region: Secondary | ICD-10-CM

## 2014-12-01 DIAGNOSIS — M5126 Other intervertebral disc displacement, lumbar region: Secondary | ICD-10-CM | POA: Diagnosis not present

## 2014-12-01 DIAGNOSIS — M545 Low back pain: Secondary | ICD-10-CM | POA: Diagnosis not present

## 2014-12-01 NOTE — Assessment & Plan Note (Signed)
-   11/30/2014  Walked RA x 1 laps @ 185 ft each stopped due to sob/back pain, nl pace, no desat   Symptoms are markedly disproportionate to objective findings and not clear this is a lung problem but pt does appear to have difficult airway management issues. DDX of  difficult airways management all start with A and  include Adherence, Ace Inhibitors, Acid Reflux, Active Sinus Disease, Alpha 1 Antitripsin deficiency, Anxiety masquerading as Airways dz,  ABPA,  allergy(esp in young), Aspiration (esp in elderly), Adverse effects of meds,  Active smokers, A bunch of PE's (a small clot burden can't cause this syndrome unless there is already severe underlying pulm or vascular dz with poor reserve) plus two Bs  = Bronchiectasis and Beta blocker use..and one C= CHF  Adherence is always the initial "prime suspect" and is a multilayered concern that requires a "trust but verify" approach in every patient - starting with knowing how to use medications, especially inhalers, correctly, keeping up with refills and understanding the fundamental difference between maintenance and prns vs those medications only taken for a very short course and then stopped and not refilled.   ? Acid (or non-acid) GERD > always difficult to exclude as up to 75% of pts in some series report no assoc GI/ Heartburn symptoms> rec max (24h)  acid suppression and diet restrictions/ reviewed and instructions given in writing.   ? Allergy/ asthma very unlikely s noct symptoms or any cough  ? Anxiety > usually at the bottom of this list of usual suspects  But may be related to sob assoc with chf/wt gain which is negated by water aerobics   ? chf > bnp intermediate, last myoview s ischemia but ef around 35% so needs continued f/u with cards and keep on dry side if tolerates   Needs to return for a chest x-ray and full set of PFTs. I'm concerned about the amiodarone exposure but note that his sedimentation rate is low and he did not desaturate  with activity so I would not change that for now pending f/u here  Total time = 25m review case with pt/ discussion/ counseling/ giving and going over instructions (see avs)

## 2014-12-01 NOTE — Progress Notes (Signed)
Quick Note:  Spoke with pt and notified of results per Dr. Wert. Pt verbalized understanding and denied any questions.  ______ 

## 2014-12-03 ENCOUNTER — Encounter (INDEPENDENT_AMBULATORY_CARE_PROVIDER_SITE_OTHER): Payer: Medicare Other | Admitting: Ophthalmology

## 2014-12-03 DIAGNOSIS — H43813 Vitreous degeneration, bilateral: Secondary | ICD-10-CM

## 2014-12-03 DIAGNOSIS — I10 Essential (primary) hypertension: Secondary | ICD-10-CM

## 2014-12-03 DIAGNOSIS — H353132 Nonexudative age-related macular degeneration, bilateral, intermediate dry stage: Secondary | ICD-10-CM | POA: Diagnosis not present

## 2014-12-03 DIAGNOSIS — H348322 Tributary (branch) retinal vein occlusion, left eye, stable: Secondary | ICD-10-CM

## 2014-12-03 DIAGNOSIS — H35033 Hypertensive retinopathy, bilateral: Secondary | ICD-10-CM

## 2014-12-14 ENCOUNTER — Ambulatory Visit: Payer: Medicare Other | Admitting: Internal Medicine

## 2014-12-14 ENCOUNTER — Other Ambulatory Visit: Payer: Self-pay | Admitting: Internal Medicine

## 2014-12-14 ENCOUNTER — Ambulatory Visit (INDEPENDENT_AMBULATORY_CARE_PROVIDER_SITE_OTHER): Payer: Medicare Other | Admitting: Internal Medicine

## 2014-12-14 ENCOUNTER — Ambulatory Visit (INDEPENDENT_AMBULATORY_CARE_PROVIDER_SITE_OTHER)
Admission: RE | Admit: 2014-12-14 | Discharge: 2014-12-14 | Disposition: A | Discharge status: Home / Self Care | Payer: Medicare Other | Source: Ambulatory Visit | Attending: Internal Medicine | Admitting: Internal Medicine

## 2014-12-14 ENCOUNTER — Encounter: Payer: Self-pay | Admitting: Internal Medicine

## 2014-12-14 VITALS — BP 120/78 | HR 54 | Ht 68.0 in | Wt 195.8 lb

## 2014-12-14 DIAGNOSIS — I2510 Atherosclerotic heart disease of native coronary artery without angina pectoris: Secondary | ICD-10-CM

## 2014-12-14 DIAGNOSIS — Z23 Encounter for immunization: Secondary | ICD-10-CM | POA: Diagnosis not present

## 2014-12-14 DIAGNOSIS — R0600 Dyspnea, unspecified: Secondary | ICD-10-CM | POA: Diagnosis not present

## 2014-12-14 DIAGNOSIS — R0602 Shortness of breath: Secondary | ICD-10-CM | POA: Diagnosis not present

## 2014-12-14 NOTE — Progress Notes (Signed)
Subjective:    Patient ID: [name], male     DOB: March 06, 1926      MRN: [id number]   Brief patient profile:  89 yowm quit smoking 1962 due to cough which resolved self referred 11/30/14 to pulmonary clinic  for sob x Fall 2015.     History of Present Illness  11/30/2014 1st [facility] office visit/ [facility]   Chief Complaint  Patient presents with  . Pulmonary Consult    Self referral. Pt states suffering from extreme SOB all the time, producitive cough with white mucus, chest pain, back pain, and wheezing. Denies fever, hemptoysis, edema   indolent onset doe since early fall  of 2015 progressive to point where not able to walk to mb assoc with 25 lb wt gain,no resting or supine symptoms - did fine in the pool  With ex  But otherwise can't exert at all/ also worse bending over at waist rec Continue water exercise at the y  Prilosec 20 mg   Take  30-60 min before first meal of the day and ranitidine 150 mg one @  bedtime until return to office  GERD  Diet   12/14/2014  f/u ov/[facility] re: sob  Chief Complaint  Patient presents with  . Follow-up    Pt states breathing has been worse for the past few days. He states still having CP when he takes a breath and "have problems with burping".  He states that he could not take prilosec due to insomnia and it "made me sick".  Walks to mb stops half way both times more due to fatigue than sob / has not [facility] yet   No obvious day to day or daytime variability or assoc chronic cough or cp or chest tightness, subjective wheeze or overt sinus or hb symptoms. No unusual exp hx or h/o childhood pna/ asthma or knowledge of premature birth.  Sleeping ok without nocturnal  or early am exacerbation  of respiratory  c/o's or need for noct saba. Also denies any obvious fluctuation of symptoms with weather or environmental changes or other aggravating or alleviating factors except as outlined above   Current Medications, Allergies,  Complete Past Medical History, Past Surgical History, Family History, and Social History were reviewed in [employer] record.  ROS  The following are not active complaints unless bolded sore throat, dysphagia, dental problems, itching, sneezing,  nasal congestion or excess/ purulent secretions, ear ache,   fever, chills, sweats, unintended wt loss, classically pleuritic or exertional cp, hemoptysis,  orthopnea pnd or leg swelling, presyncope, palpitations, abdominal pain, anorexia, nausea, vomiting, diarrhea  or change in bowel or bladder habits, change in stools or urine, dysuria,hematuria,  rash, arthralgias, visual complaints, headache, numbness, weakness or ataxia or problems with walking or coordination,  change in mood/affect or memory.              Objective:   Physical Exam  Pleasant amb wm nad  12/14/2014   196  Wt Readings from Last 3 Encounters:  11/30/14 199 lb 3.2 oz (90.357 kg)  07/14/14 192 lb 12.8 oz (87.454 kg)  07/02/14 192 lb (87.091 kg)    Vital signs reviewed   HEENT: nl dentition, turbinates, and orophanx. Nl external ear canals without cough reflex   NECK :  without JVD/Nodes/TM/ nl carotid upstrokes bilaterally   LUNGS: no acc muscle use, clear to A and P bilaterally without cough on insp or exp maneuvers  CV:  RRR  no s3 or murmur or increase in P2, no edema   ABD:  soft and nontender with pos early insp Hoover's in supine position.  No bruits or organomegaly, bowel sounds nl  MS:  warm without deformities, calf tenderness, cyanosis or clubbing  SKIN: warm and dry without lesions    NEURO:  alert, approp, no deficits   Labs  reviewed:   Lab 11/30/14 1604  NA 135  K 4.6  CL 100  CO2 28  BUN 18  CREATININE 1.24  GLUCOSE 98      Lab 11/30/14 1604  HGB 13.0  HCT 38.0*  WBC 6.6  PLT 149.0*     Lab Results  Component Value Date   TSH 1.70 11/30/2014     Lab Results  Component Value Date   PROBNP 150.0*  11/30/2014       Lab Results  Component Value Date   ESRSEDRATE 6 11/30/2014            I personally reviewed images and agree with radiology impression as follows:  CXR: 12/14/14 No active cardiopulmonary disease.       Assessment & Plan:

## 2014-12-14 NOTE — Progress Notes (Signed)
Quick Note:  D/W pt at ov ______

## 2014-12-14 NOTE — Patient Instructions (Addendum)
Try Protonix 40 mg  Take 30-60 min before first meal of the day and Pepcid ac (famotidine) 20 mg one @  bedtime until cough is completely gone for at least a week without the need for cough suppression     GERD (REFLUX)  is an extremely common cause of respiratory symptoms just like yours , many times with no obvious heartburn at all.    It can be treated with medication, but also with lifestyle changes including elevation of the head of your bed (ideally with 6 inch  bed blocks),  Smoking cessation, avoidance of late meals, excessive alcohol, and avoid fatty foods, chocolate, peppermint, colas, red wine, and acidic juices such as orange juice.  NO MINT OR MENTHOL PRODUCTS SO NO COUGH DROPS  USE SUGARLESS CANDY INSTEAD (Jolley ranchers or Stover's or Life Savers) or even ice chips will also do - the key is to swallow to prevent all throat clearing. NO OIL BASED VITAMINS - use powdered substitutes.    Please schedule a follow up office visit in 6 weeks, call sooner if needed with pfts

## 2014-12-15 NOTE — Assessment & Plan Note (Signed)
-   11/30/2014  Walked RA x 1 laps @ 185 ft each stopped due to sob/back pain, nl pace, no desat   Unusual hx of sob walking but not doing water aerobics and not really able to reproduce the problem here s his stopping due to back pain but needs baseline pfts and continue gerd rx in meantime  I had an extended discussion with the patient and care giver  reviewing all relevant studies completed to date and  lasting 15 to 20 minutes of a 25 minute visit    Each maintenance medication was reviewed in detail including most importantly the difference between maintenance and prns and under what circumstances the prns are to be triggered using an action plan format that is not reflected in the computer generated alphabetically organized AVS.    Please see instructions for details which were reviewed in writing and the patient given a copy highlighting the part that I personally wrote and discussed at today's ov.

## 2014-12-17 DIAGNOSIS — Z961 Presence of intraocular lens: Secondary | ICD-10-CM | POA: Diagnosis not present

## 2014-12-17 DIAGNOSIS — H01024 Squamous blepharitis left upper eyelid: Secondary | ICD-10-CM | POA: Diagnosis not present

## 2014-12-17 DIAGNOSIS — H401133 Primary open-angle glaucoma, bilateral, severe stage: Secondary | ICD-10-CM | POA: Diagnosis not present

## 2014-12-17 DIAGNOSIS — H01022 Squamous blepharitis right lower eyelid: Secondary | ICD-10-CM | POA: Diagnosis not present

## 2014-12-17 DIAGNOSIS — H01021 Squamous blepharitis right upper eyelid: Secondary | ICD-10-CM | POA: Diagnosis not present

## 2014-12-17 DIAGNOSIS — H01025 Squamous blepharitis left lower eyelid: Secondary | ICD-10-CM | POA: Diagnosis not present

## 2014-12-23 DIAGNOSIS — M4806 Spinal stenosis, lumbar region: Secondary | ICD-10-CM | POA: Diagnosis not present

## 2014-12-23 DIAGNOSIS — M5416 Radiculopathy, lumbar region: Secondary | ICD-10-CM | POA: Diagnosis not present

## 2014-12-23 DIAGNOSIS — M5136 Other intervertebral disc degeneration, lumbar region: Secondary | ICD-10-CM | POA: Diagnosis not present

## 2014-12-27 DIAGNOSIS — Z6831 Body mass index (BMI) 31.0-31.9, adult: Secondary | ICD-10-CM | POA: Diagnosis not present

## 2014-12-27 DIAGNOSIS — I5020 Unspecified systolic (congestive) heart failure: Secondary | ICD-10-CM | POA: Diagnosis not present

## 2014-12-27 DIAGNOSIS — I10 Essential (primary) hypertension: Secondary | ICD-10-CM | POA: Diagnosis not present

## 2014-12-27 DIAGNOSIS — I480 Paroxysmal atrial fibrillation: Secondary | ICD-10-CM | POA: Diagnosis not present

## 2014-12-28 ENCOUNTER — Other Ambulatory Visit: Payer: Self-pay

## 2014-12-28 ENCOUNTER — Encounter: Payer: Self-pay | Admitting: Nurse Practitioner

## 2014-12-28 ENCOUNTER — Other Ambulatory Visit: Payer: Self-pay | Admitting: Nurse Practitioner

## 2014-12-28 ENCOUNTER — Ambulatory Visit (INDEPENDENT_AMBULATORY_CARE_PROVIDER_SITE_OTHER): Payer: Medicare Other | Admitting: Nurse Practitioner

## 2014-12-28 ENCOUNTER — Ambulatory Visit (HOSPITAL_COMMUNITY): Payer: Medicare Other | Attending: Cardiovascular Disease

## 2014-12-28 VITALS — BP 110/84 | HR 71 | Ht 68.0 in | Wt 194.4 lb

## 2014-12-28 DIAGNOSIS — I255 Ischemic cardiomyopathy: Secondary | ICD-10-CM

## 2014-12-28 DIAGNOSIS — I517 Cardiomegaly: Secondary | ICD-10-CM | POA: Diagnosis not present

## 2014-12-28 DIAGNOSIS — I447 Left bundle-branch block, unspecified: Secondary | ICD-10-CM | POA: Insufficient documentation

## 2014-12-28 DIAGNOSIS — I480 Paroxysmal atrial fibrillation: Secondary | ICD-10-CM

## 2014-12-28 DIAGNOSIS — I2589 Other forms of chronic ischemic heart disease: Secondary | ICD-10-CM | POA: Diagnosis not present

## 2014-12-28 DIAGNOSIS — I340 Nonrheumatic mitral (valve) insufficiency: Secondary | ICD-10-CM | POA: Insufficient documentation

## 2014-12-28 DIAGNOSIS — I10 Essential (primary) hypertension: Secondary | ICD-10-CM

## 2014-12-28 DIAGNOSIS — R0600 Dyspnea, unspecified: Secondary | ICD-10-CM | POA: Diagnosis not present

## 2014-12-28 DIAGNOSIS — Z87891 Personal history of nicotine dependence: Secondary | ICD-10-CM | POA: Diagnosis not present

## 2014-12-28 DIAGNOSIS — I4891 Unspecified atrial fibrillation: Secondary | ICD-10-CM | POA: Diagnosis not present

## 2014-12-28 LAB — HEPATIC FUNCTION PANEL
ALT: 14 U/L (ref 9–46)
AST: 18 U/L (ref 10–35)
Albumin: 4.1 g/dL (ref 3.6–5.1)
Alkaline Phosphatase: 66 U/L (ref 40–115)
Bilirubin, Direct: 0.1 mg/dL (ref <=0.2)
Indirect Bilirubin: 0.6 mg/dL (ref 0.2–1.2)
Total Bilirubin: 0.7 mg/dL (ref 0.2–1.2)
Total Protein: 6.3 g/dL (ref 6.1–8.1)

## 2014-12-28 LAB — TSH: TSH: 1.901 u[IU]/mL (ref 0.350–4.500)

## 2014-12-28 MED ORDER — LOSARTAN POTASSIUM 25 MG PO TABS: 12.5000 mg | ORAL_TABLET | Freq: Every day | ORAL | Status: DC

## 2014-12-28 MED ORDER — LOSARTAN POTASSIUM 25 MG PO TABS: 25.0000 mg | ORAL_TABLET | Freq: Every day | ORAL | Status: DC

## 2014-12-28 MED ORDER — FUROSEMIDE 40 MG PO TABS: 40.0000 mg | ORAL_TABLET | Freq: Every day | ORAL | Status: DC

## 2014-12-28 NOTE — Patient Instructions (Addendum)
We will be checking the following labs today - BNP, BMET, CBC, HPF and TSH   Medication Instructions:    Continue with your current medicines. BUT  I am cutting the Losartan back to 25 mg - to take just 1/2 tablet daily - only if BP above 111 systolic (top #)  I am adding Lasix 40 mg to take 1 1/2 tablets daily for 3 days and then just one pill a day    Testing/Procedures To Be Arranged:  N/A  Follow-Up:   See me on Monday in the FLEX    Other Special Instructions:   Cut back on the salt and fluids    If you need a refill on your cardiac medications before your next appointment, please call your pharmacy.   Call the [place] office at [phone] if you have any questions, problems or concerns.

## 2014-12-28 NOTE — Addendum Note (Signed)
Addended by: Burtis Junes on: 12/28/2014 02:34 PM   Modules accepted: Orders

## 2014-12-28 NOTE — Progress Notes (Addendum)
CARDIOLOGY OFFICE NOTE  Date:  12/28/2014    [name] Date of Birth: 1926-03-23 Medical Record #[id number]  PCP:  Velna Hatchet, MD  Cardiologist:  Noelle Penner  Chief Complaint  Patient presents with  . Atrial Fibrillation    Work in visit - seen for Dr. Irish Lack    History of Present Illness: [name] is a 81 y.o. male who presents today for a work in visit. Seen for Dr. Irish Lack. He has a history of atrial fib - NSR on Amiodarone, bradycardia, LBBB, past GI bleed, and CAD. He had a complex PCI of his LAD in July 2015 involving rotational atherectomy and DES. Prior to the intervention, his ejection fraction was in the 30-35% range. If his EF had remained depressed [name] was going to place a BiV pacemaker. Echocardiogram about a month after the intervention showed an improvement in his EF from 50-55%. He has not been on chronic anticoagulation due to prior history of GI bleeding.   Seen back in April by [place] in the flex clinic for atypical chest pain. A Myoview was done that showed an EF of 30-35% but no ischemia - low risk. He was also noted to have sinus bradycardia with HR in the 40's. He is not on beta blocker therapy. It was hard to tell if he was symptomatic from that.  He has lots of symptoms and anxiety as a baseline. He has chronic back pain and DJD that does limit his activity.   He was last seen here in May by Kerin Ransom, [place]. He was bradycardic - discussion with Dr. Lovena Le -  no CHF on exam and no clear history of symptomatic bradycardia. [name] did not recommend a pacemaker at that time. If the pt was to have clear cut symptoms of bradycardia - syncope or near syncope - we would then consider it. Dr. Lovena Le did not want to place an ICD in this 81 y/o but would consider a BiV pacemaker if his LVF was confirmed to be significantly depressed by echo. Last echo looks to be from August of 2015.   CHADSVASC is at least a 5 with a 6.7% annual  stroke risk.   Comes back today. Here with his caregiver - Manuela Schwartz. He feels bad. He is weak. Short of breath. Bloated. Says he can hardly go. Early satiety. No syncope. No chest pain.   Past Medical History  Diagnosis Date  . PAF (paroxysmal atrial fibrillation) (HCC)     controlled w/ amiodarone; not on coumadin due to hx of GI bleed  . Mitral regurgitation   . GERD (gastroesophageal reflux disease)     occ. take prevacid  . Meningioma ([place]) right -sided w/ right VI palsy    followed by dr Gaynell Face  . Diverticulitis of colon with bleeding     s/p sigmoid resection '88  . History of GI diverticular bleed april 2012    transfused blood and resolved without surgical intervention  . Impotence, organic     s/p penile prosthesis 1990's  . BPH (benign prostatic hypertrophy) with urinary obstruction     s/p turp yrs ago  . Blood transfusion     "related to a surgery"  . Impaired hearing bilateral     only left hearing aid  . Bradycardia     Amio d/c'd 08/2013; brady arrest 08/2013 after PCI >>> recurrent AF >>> Amiodarone restarted (may need BiV pacer if EF does not recover; consider Holter  if + sx's)  . Cardiomyopathy ([place])     a. Echo (08/2013):  EF 30-35%, AS hypokinesis, Gr 1 diast dysfn, mild MR, mild LAE >>> b. improved EF 50-55% by echo 8/15  . CAD (coronary artery disease)     a. s/p MI and prior PCI of LAD;  b. LHC (08/2013):  prox LAD 60-70%, mid LAD stents ok, ostial lesion at Dx jailed by stent, mild CFX and RCA disesase >>>  PCI (09/08/13):  rotational atherectomy + Promus DES to prox LAD  . Mitral valve disorders   . Hx of echocardiogram     Echo (8/15):  Mod LVH, EF 50-55%, Gr 1 DD, mild MR, mild RAE  . Prostate cancer ([place]) 11/30/13    Gleason 8, volume 22.14 cc  . Atrial fibrillation ([place])   . Heart murmur     with mitral valve regurgitation  . Anxiety   . Depression   . Hypertension   . CHF (congestive heart failure) ([place])   . Myocardial infarction [facility]) 1980's- medical  intervention    "so mild I didn't know I'd had it"  . Sleep apnea     non-compliant cpap  . DJD (degenerative joint disease) hips and knees    s/p bilateral total replacements  . DDD (degenerative disc disease)     chronic back pain  . Arthritis     "I'm covered up w/it"  . Chronic lower back pain   . Incomplete bladder emptying     Past Surgical History  Procedure Laterality Date  . Cholecystectomy    . Total hip arthroplasty Right 03-25-08--  . Total hip arthroplasty Left 2005  . Total knee arthroplasty Right 2004  . Total knee arthroplasty Left 1997  . Inguinal hernia repair Bilateral   . Cataract extraction w/ intraocular lens  implant, bilateral Bilateral ~ 2000  . Appendectomy    . Transurethral resection of prostate  "years ago"  . Penile prosthesis implant  1990's  . Shoulder open rotator cuff repair Left   . Inner ear surgery Right yrs ago    "trying to get my hearing back  . Cardiac catheterization  2007    noncritical cad (results w/ chart)  . Coronary angioplasty with stent placement  08-03-08    drug-eluting stent x2 distal and mid lad  . Transurethral resection of prostate  01/08/2011    Procedure: TRANSURETHRAL RESECTION OF THE PROSTATE (TURP);  Surgeon: Franchot Gallo;  Location: [facility];  Service: Urology;  Laterality: N/A;  GYRUS   . Prostate biopsy  11/30/13    Gleason 8, vol 22.14 cc  . Gamma knife radiation  2000    [facility] for meningioma, last eval 2013- no change  . Left heart catheterization with coronary angiogram N/A 09/04/2013    Procedure: LEFT HEART CATHETERIZATION WITH CORONARY ANGIOGRAM;  Surgeon: Jettie Booze, MD;  Location: [facility] CATH LAB;  Service: Cardiovascular;  Laterality: N/A;  . Percutaneous coronary rotoblator intervention (pci-r) N/A 09/08/2013    Procedure: PERCUTANEOUS CORONARY ROTOBLATOR INTERVENTION (PCI-R);  Surgeon: Jettie Booze, MD;  Location: [facility] CATH LAB;  Service: Cardiovascular;  Laterality:  N/A;  . Revision total knee arthroplasty Right   . Total hip revision Right 3-4 times  . Closed reduction hip dislocation Right "several"  . Esophagogastroduodenoscopy N/A 02/11/2014    Procedure: ESOPHAGOGASTRODUODENOSCOPY (EGD);  Surgeon: Cleotis Nipper, MD;  Location: [facility] ENDOSCOPY;  Service: Endoscopy;  Laterality: N/A;     Medications: Current Outpatient Prescriptions  Medication  Sig Dispense Refill  . amiodarone (PACERONE) 100 MG tablet Take 1 tablet (100 mg total) by mouth daily. 30 tablet 6  . bicalutamide (CASODEX) 50 MG tablet Take 50 mg by mouth daily.   3  . clopidogrel (PLAVIX) 75 MG tablet Take 1 tablet (75 mg total) by mouth daily. 30 tablet 5  . dorzolamide-timolol (COSOPT) 22.3-6.8 MG/ML ophthalmic solution Place 1 drop into both eyes 2 (two) times daily.   2  . doxazosin (CARDURA) 4 MG tablet Take 4 mg by mouth daily.  0  . finasteride (PROSCAR) 5 MG tablet Take 5 mg by mouth daily.    . fluticasone (FLONASE) 50 MCG/ACT nasal spray Place 2 sprays into both nostrils daily.   1  . Multiple Vitamin (MULTIVITAMIN) tablet Take 1 tablet by mouth daily.      . nitroGLYCERIN (NITROSTAT) 0.4 MG SL tablet Place 1 tablet (0.4 mg total) under the tongue every 5 (five) minutes as needed for chest pain. 20 tablet 0  . OVER THE COUNTER MEDICATION Take 1 tablet by mouth 2 (two) times daily. Vitamins for eyes    . OVER THE COUNTER MEDICATION Take 2 tablets by mouth daily. [facility] brand for prostate health    . Pancrelipase, Lip-Prot-Amyl, (CREON PO) Take by mouth 4 (four) times daily -  with meals and at bedtime. 3 daily    . Probiotic Product (PROBIOTIC DAILY PO) Take 1 capsule by mouth daily.    . psyllium (METAMUCIL) 58.6 % packet Take 1 packet by mouth daily as needed (for constipation).    . ranitidine (ZANTAC) 150 MG tablet Take 150-300 mg by mouth 2 (two) times daily as needed for heartburn.     . silodosin (RAPAFLO) 8 MG CAPS capsule Take 8 mg by mouth daily with breakfast.      . traMADol (ULTRAM) 50 MG tablet Take 50 mg by mouth 3 (three) times daily.    . furosemide (LASIX) 40 MG tablet Take 1 tablet (40 mg total) by mouth daily. 45 tablet 3  . losartan (COZAAR) 25 MG tablet Take 0.5 tablets (12.5 mg total) by mouth daily. 30 tablet 3   No current facility-administered medications for this visit.    Allergies: Allergies  Allergen Reactions  . Codeine Other (See Comments) and Anxiety    Insomnia/ hyper  . Prilosec [Omeprazole] Other (See Comments) and Nausea And Vomiting    GI upset, insomnia GI upset, insomnia  . Warfarin Sodium Other (See Comments) and Anxiety    Hx gi bleed Hx gi bleed    Social History: The patient  reports that he quit smoking about 56 years ago. His smoking use included Cigarettes. He has a 14 pack-year smoking history. He has never used smokeless tobacco. He reports that he drinks alcohol. He reports that he does not use illicit drugs.   Family History: The patient's family history includes Cancer in his brother and another family member; Emphysema in his father; Heart attack in his mother; Heart disease in his mother.   Review of Systems: Please see the history of present illness.   Otherwise, the review of systems is positive for none.   All other systems are reviewed and negative.   Physical Exam: VS:  BP 110/84 mmHg  Pulse 71  Ht 5\' 8"  (1.727 m)  Wt 194 lb 6.4 oz (88.179 kg)  BMI 29.57 kg/m2 .  BMI Body mass index is 29.57 kg/(m^2).  Wt Readings from Last 3 Encounters:  12/28/14 194 lb 6.4 oz (  88.179 kg)  12/14/14 195 lb 12.8 oz (88.814 kg)  11/30/14 199 lb 3.2 oz (90.357 kg)    General: Pleasant. Elderly male who is alert and in no acute distress.  HEENT: Normal. Neck: Supple, + JVD noted.  Cardiac: Irregular irregular rhythm - heart tones are distant. No edema.  Respiratory:  Lungs are fairly clear to auscultation bilaterally with normal work of breathing.  GI: Distended.   MS: No deformity or atrophy. Gait  and ROM intact. Skin: Warm and dry. Color is normal. He is tanned.  Neuro:  Strength and sensation are intact and no gross focal deficits noted.  Psych: Alert, appropriate and with normal affect.   LABORATORY DATA:  EKG:  EKG is ordered today. This demonstrates atrial fib with a controlled VR.  Lab Results  Component Value Date   WBC 6.6 11/30/2014   HGB 13.0 11/30/2014   HCT 38.0* 11/30/2014   PLT 149.0* 11/30/2014   GLUCOSE 98 11/30/2014   ALT 21 02/10/2014   AST 23 02/10/2014   NA 135 11/30/2014   K 4.6 11/30/2014   CL 100 11/30/2014   CREATININE 1.24 11/30/2014   BUN 18 11/30/2014   CO2 28 11/30/2014   TSH 1.70 11/30/2014   INR 1.17 02/10/2014   HGBA1C 5.4 02/10/2014    BNP (last 3 results) No results for input(s): BNP in the last 8760 hours.  ProBNP (last 3 results)  Recent Labs  11/30/14 1604  PROBNP 150.0*     Other Studies Reviewed Today:  Had CMET and CBC yesterday which were reviewed.   Myoview Study Highlights from 06/2014    Intermediate stress nuclear study with chest tightness; ECG uninterpretable due to baseline LBBB; large, severe, fixed inferoseptal and apical defect consistent with prior infarct vs LBBB; no ischemia; moderate global reduction in LV function and LVE; study intermediate risk due to reduced LV function.    Echo Study Conclusions from 09/2013 - Left ventricle: The cavity size was normal. Wall thickness was increased in a pattern of moderate LVH. Systolic function was normal. The estimated ejection fraction was in the range of 50% to 55%. There was an increased relative contribution of atrial contraction to ventricular filling. Doppler parameters are consistent with abnormal left ventricular relaxation (grade 1 diastolic dysfunction). - Mitral valve: There was mild regurgitation. - Right atrium: The atrium was mildly dilated.  Assessment/Plan: 1. PAF - back in atrial fib today - hard to say how long this has been  going on. Very difficult situation - he is not on anticoagulation other than Plavix - has had prior GI bleeding - on more than one occasion - I suspect he is having more heart failure and the atrial fib is exacerbating this. Needs an echo to recheck his EF. We have talked about anticoagulation and understanding that there will be the risk of bleeding. For now, we will hold off - try to tune up - see back on Monday. Need for Dr. Irish Lack to review and give his input as well.   2. History of bradycardia - rate is currently ok on no medicines. Pacemaker not warranted at this time  3. CAD - no active chest pain - he is over one year out from his PCI  4. LV dysfunction - noted by Myoview earlier this year - echo from a year ago does not correlate with this date - need to get updated - will do today. He looks like he has more heart failure to me - I suspect  the atrial fib is just making this worse - Adding Lasix 60 mg for 3 days and then just 40 mg a day. Lab today. Cutting Losartan back to just 12.5 mg due to BP.   5. Advanced age  Current medicines are reviewed with the patient today.  The patient does not have concerns regarding medicines other than what has been noted above.  The following changes have been made:  See above.  Labs/ tests ordered today include:    Orders Placed This Encounter  Procedures  . Brain natriuretic peptide  . Basic metabolic panel  . CBC  . Hepatic function panel  . TSH  . ECHOCARDIOGRAM COMPLETE     Disposition:   FU me on Monday in the FLEX  Patient is agreeable to this plan and will call if any problems develop in the interim.   Signed: Burtis Junes, RN, ANP-C 12/28/2014 2:08 PM  [facility] [address] [place], [place]  [address] Phone: [phone] Fax: [phone]        Addendum: Discussed this case with Dr. Lovena Le 12/29/2014 His thoughts were to try and diurese. He would NOT put on  anticoagulation given recurrent GI bleeding. HR is currently acceptable. Only thing that may help - but would be last resort - is AV nodal ablation with BiV pacer. For now, he would like to manage medically. Dr. Lovena Le did not feel that the Watchman Device would be an appropriate option as well.   Burtis Junes, RN, [facility] [address] [place], [place]  [phone]

## 2014-12-29 ENCOUNTER — Telehealth: Payer: Self-pay

## 2014-12-29 LAB — BRAIN NATRIURETIC PEPTIDE: Brain Natriuretic Peptide: 192.1 pg/mL — ABNORMAL HIGH (ref 0.0–100.0)

## 2014-12-29 NOTE — Telephone Encounter (Signed)
-----   Message from Burtis Junes, NP sent at 12/29/2014  1:32 PM EDT ----- Ok to report. The remainder of his labs looks ok.  Please let him know (may have to speak to the caregiver - Manuela Schwartz) that I talked with Dr. Lovena Le this morning. He agrees with trying to "tune up". No need for pacemaker at this time.  Will see back on Monday as planned.

## 2014-12-29 NOTE — Telephone Encounter (Signed)
Pt aware of lab results and Tera Helper, NP recommendation. Ok to report. The remainder of his labs looks ok.  Please let him know (may have to speak to the caregiver - Manuela Schwartz) that I talked with Dr. Lovena Le this morning. He agrees with trying to "tune up". No need for pacemaker at this time.  Will see back on Monday as planned Pt verbalized understanding and repeated instruction back to me.

## 2014-12-31 NOTE — Addendum Note (Signed)
Addended by: Freada Bergeron on: 12/31/2014 04:02 PM   Modules accepted: Orders

## 2015-01-01 ENCOUNTER — Emergency Department (HOSPITAL_COMMUNITY): Payer: Medicare Other

## 2015-01-01 ENCOUNTER — Encounter (HOSPITAL_COMMUNITY): Payer: Self-pay

## 2015-01-01 ENCOUNTER — Emergency Department (HOSPITAL_COMMUNITY)
Admission: EM | Admit: 2015-01-01 | Discharge: 2015-01-01 | Disposition: A | Discharge status: Home / Self Care | Payer: Medicare Other | Source: Home / Self Care | Attending: Emergency Medicine | Admitting: Emergency Medicine

## 2015-01-01 DIAGNOSIS — N183 Chronic kidney disease, stage 3 (moderate): Secondary | ICD-10-CM | POA: Diagnosis not present

## 2015-01-01 DIAGNOSIS — I480 Paroxysmal atrial fibrillation: Secondary | ICD-10-CM | POA: Diagnosis not present

## 2015-01-01 DIAGNOSIS — R05 Cough: Secondary | ICD-10-CM | POA: Diagnosis not present

## 2015-01-01 DIAGNOSIS — I429 Cardiomyopathy, unspecified: Secondary | ICD-10-CM | POA: Diagnosis not present

## 2015-01-01 DIAGNOSIS — E871 Hypo-osmolality and hyponatremia: Secondary | ICD-10-CM | POA: Diagnosis not present

## 2015-01-01 DIAGNOSIS — I130 Hypertensive heart and chronic kidney disease with heart failure and stage 1 through stage 4 chronic kidney disease, or unspecified chronic kidney disease: Secondary | ICD-10-CM | POA: Diagnosis not present

## 2015-01-01 DIAGNOSIS — R0789 Other chest pain: Secondary | ICD-10-CM | POA: Diagnosis not present

## 2015-01-01 DIAGNOSIS — R531 Weakness: Secondary | ICD-10-CM | POA: Diagnosis not present

## 2015-01-01 DIAGNOSIS — I5043 Acute on chronic combined systolic (congestive) and diastolic (congestive) heart failure: Secondary | ICD-10-CM | POA: Diagnosis not present

## 2015-01-01 LAB — CBC
HEMATOCRIT: 41.9 % (ref 39.0–52.0)
Hemoglobin: 14.6 g/dL (ref 13.0–17.0)
MCH: 32.2 pg (ref 26.0–34.0)
MCHC: 34.8 g/dL (ref 30.0–36.0)
MCV: 92.5 fL (ref 78.0–100.0)
Platelets: 146 10*3/uL — ABNORMAL LOW (ref 150–400)
RBC: 4.53 MIL/uL (ref 4.22–5.81)
RDW: 13.1 % (ref 11.5–15.5)
WBC: 7.1 10*3/uL (ref 4.0–10.5)

## 2015-01-01 LAB — URINALYSIS, ROUTINE W REFLEX MICROSCOPIC
Bilirubin Urine: NEGATIVE
Glucose, UA: NEGATIVE mg/dL
KETONES UR: NEGATIVE mg/dL
LEUKOCYTES UA: NEGATIVE
NITRITE: NEGATIVE
PH: 6.5 (ref 5.0–8.0)
Protein, ur: NEGATIVE mg/dL
SPECIFIC GRAVITY, URINE: 1.007 (ref 1.005–1.030)
Urobilinogen, UA: 0.2 mg/dL (ref 0.0–1.0)

## 2015-01-01 LAB — BASIC METABOLIC PANEL
Anion gap: 11 (ref 5–15)
BUN: 22 mg/dL — ABNORMAL HIGH (ref 6–20)
CHLORIDE: 97 mmol/L — AB (ref 101–111)
CO2: 28 mmol/L (ref 22–32)
Calcium: 9.7 mg/dL (ref 8.9–10.3)
Creatinine, Ser: 1.42 mg/dL — ABNORMAL HIGH (ref 0.61–1.24)
GFR, EST AFRICAN AMERICAN: 50 mL/min — AB (ref >60)
GFR, EST NON AFRICAN AMERICAN: 43 mL/min — AB (ref >60)
Glucose, Bld: 122 mg/dL — ABNORMAL HIGH (ref 65–99)
POTASSIUM: 3.7 mmol/L (ref 3.5–5.1)
SODIUM: 136 mmol/L (ref 135–145)

## 2015-01-01 LAB — I-STAT TROPONIN, ED
TROPONIN I, POC: 0.01 ng/mL (ref 0.00–0.08)
Troponin i, poc: 0.01 ng/mL (ref 0.00–0.08)

## 2015-01-01 LAB — URINE MICROSCOPIC-ADD ON

## 2015-01-01 LAB — BRAIN NATRIURETIC PEPTIDE: B Natriuretic Peptide: 101.4 pg/mL — ABNORMAL HIGH (ref 0.0–100.0)

## 2015-01-01 MED ORDER — ONDANSETRON HCL 4 MG/2ML IJ SOLN
4.0000 mg | Freq: Once | INTRAMUSCULAR | Status: AC
  Administered 2015-01-01: 4 mg via INTRAVENOUS
  Filled 2015-01-01: qty 2

## 2015-01-01 MED ORDER — SODIUM CHLORIDE 0.9 % IV SOLN
Freq: Once | INTRAVENOUS | Status: AC
  Administered 2015-01-01: 14:00:00 via INTRAVENOUS

## 2015-01-01 NOTE — Discharge Instructions (Signed)
Mr. Alvizo and Family, Nice meeting you! Your work-up in the emergency department was unchanged from your previous cardiology visit. Please follow-up with your cardiologist and your primary care provider within one week. I hope you feel better soon! S. Wendie Simmer, PA-C     Weakness Weakness is a lack of strength. You may feel weak all over your body or just in one part of your body. Weakness can be serious. In some cases, you may need more medical tests. HOME [place] a well-balanced diet.  Try to exercise every day.  Only take medicines as told by your doctor. GET HELP RIGHT AWAY IF:   You cannot do your normal daily activities.  You cannot walk up and down stairs, or you feel very tired when you do so.  You have shortness of breath or chest pain.  You have trouble moving parts of your body.  You have weakness in only one body part or on only one side of the body.  You have a fever.  You have trouble speaking or swallowing.  You cannot control when you pee (urinate) or poop (bowel movement).  You have black or bloody throw up (vomit) or poop.  Your weakness gets worse or spreads to other body parts.  You have new aches or pains. MAKE SURE YOU:   Understand these instructions.  Will watch your condition.  Will get help right away if you are not doing well or get worse.   This information is not intended to replace advice given to you by your health care provider. Make sure you discuss any questions you have with your health care provider.   Document Released: 01/26/2008 Document Revised: 08/14/2011 Document Reviewed: 04/13/2011 Elsevier Interactive Patient Education [employer].

## 2015-01-01 NOTE — ED Notes (Signed)
MD at bedside. 

## 2015-01-01 NOTE — ED Provider Notes (Signed)
CSN: [id number]     Arrival date & time 01/01/15  1053 History   First MD Initiated Contact with Patient 01/01/15 1054     Chief Complaint  Patient presents with  . Chest Pain    HPI   [name] is an 81 yo M PMH PAF, cardiomyopathy, MI, CHF, HTN pw 1 day history of weakness. He states over the last month, he has had intermittent CP, SOB, and weakness, but today his weakness got progressively worse. He describes his CP as non-radiating, intermittent, 4/10 pain scale, burning, similar to GERD pain,  He was recently diagnosed with CHF and started on Lasix.    His last echo was 11/16 EF 30-35%  Past Medical History  Diagnosis Date  . PAF (paroxysmal atrial fibrillation) (HCC)     a. on amiodarone; not on coumadin due to hx of GI bleed.  . Mitral regurgitation     a. Mild-mod by echo 12/2014.  [name] GERD (gastroesophageal reflux disease)     occ. take prevacid  . Meningioma ([name]) right -sided w/ right VI palsy    followed by dr Gaynell Face  . Diverticulitis of colon with bleeding     s/p sigmoid resection '88  . History of GI diverticular bleed april 2012    transfused blood and resolved without surgical intervention  . Impotence, organic     s/p penile prosthesis 1990's  . BPH (benign prostatic hypertrophy) with urinary obstruction     s/p turp yrs ago  . Blood transfusion     "related to a surgery"  . Impaired hearing bilateral     only left hearing aid  . Bradycardia     a. Amio d/c'd 08/2013; brady arrest 08/2013 after PCI >>> recurrent AF >>> Amiodarone restarted. b. Pacemaker being considered in 11/2014.  . Cardiomyopathy ([place])     a. Echo (08/2013):  EF 30-35%, AS hypokinesis, Gr 1 diast dysfn, mild MR, mild LAE >>> b. improved EF 50-55% by echo 8/15. c. EF down again by echo 12/2014 to 30-35% but 51% by nuc.  [name] CAD (coronary artery disease)     a. s/p MI and prior PCI of LAD;  b. LHC (08/2013):  prox LAD 60-70%, mid LAD stents ok, ostial lesion at Dx jailed by stent, mild CFX  and RCA disesase >>>  PCI (09/08/13):  rotational atherectomy + Promus DES to prox LAD  . Hx of echocardiogram     Echo (8/15):  Mod LVH, EF 50-55%, Gr 1 DD, mild MR, mild RAE  . Prostate cancer ([place]) 11/30/13    Gleason 8, volume 22.14 cc  . Anxiety   . Depression   . Hypertension   . Chronic systolic CHF (congestive heart failure) ([place])   . Myocardial infarction [facility]) 1980's- medical intervention    "so mild I didn't know I'd had it"  . Sleep apnea     non-compliant cpap  . DJD (degenerative joint disease) hips and knees    s/p bilateral total replacements  . DDD (degenerative disc disease)     chronic back pain  . Arthritis   . Chronic lower back pain   . Incomplete bladder emptying   . LBBB (left bundle branch block)   . CKD (chronic kidney disease), stage III     a. Per review of labs baseline Cr 1.1-1.3.   Past Surgical History  Procedure Laterality Date  . Cholecystectomy    . Total hip arthroplasty Right 03-25-08--  . Total hip arthroplasty Left  2005  . Total knee arthroplasty Right 2004  . Total knee arthroplasty Left 1997  . Inguinal hernia repair Bilateral   . Cataract extraction w/ intraocular lens  implant, bilateral Bilateral ~ 2000  . Appendectomy    . Transurethral resection of prostate  "years ago"  . Penile prosthesis implant  1990's  . Shoulder open rotator cuff repair Left   . Inner ear surgery Right yrs ago    "trying to get my hearing back  . Cardiac catheterization  2007    noncritical cad (results w/ chart)  . Coronary angioplasty with stent placement  08-03-08    drug-eluting stent x2 distal and mid lad  . Transurethral resection of prostate  01/08/2011    Procedure: TRANSURETHRAL RESECTION OF THE PROSTATE (TURP);  Surgeon: Franchot Gallo;  Location: [place];  Service: Urology;  Laterality: N/A;  GYRUS   . Prostate biopsy  11/30/13    Gleason 8, vol 22.14 cc  . Gamma knife radiation  2000    [facility] for meningioma, last eval  2013- no change  . Left heart catheterization with coronary angiogram N/A 09/04/2013    Procedure: LEFT HEART CATHETERIZATION WITH CORONARY ANGIOGRAM;  Surgeon: Jettie Booze, MD;  Location: [facility] CATH LAB;  Service: Cardiovascular;  Laterality: N/A;  . Percutaneous coronary rotoblator intervention (pci-r) N/A 09/08/2013    Procedure: PERCUTANEOUS CORONARY ROTOBLATOR INTERVENTION (PCI-R);  Surgeon: Jettie Booze, MD;  Location: [facility] C[facility] LAB;  Service: Cardiovascular;  Laterality: N/A;  . Revision total knee arthroplasty Right   . Total hip revision Right 3-4 times  . Closed reduction hip dislocation Right "several"  . Esophagogastroduodenoscopy N/A 02/11/2014    Procedure: ESOPHAGOGASTRODUODENOSCOPY (EGD);  Surgeon: Cleotis Nipper, MD;  Location[facility] ENDOSCOPY;  Service: Endoscopy;  Laterality: N/A;   Family History  Problem Relation Age of Onset  . Heart disease Mother   . Heart attack Mother   . Cancer    . Emphysema Father   . Cancer Brother     liver cancer   Social History  Substance Use Topics  . Smoking status: Former Smoker -- 1.00 packs/day for 14 years    Types: Cigarettes    Quit date: 01/05/1959  . Smokeless tobacco: Never Used  . Alcohol Use: 0.0 oz/week     Comment: 02/09/2014 "I'm not drinking anymore"    Review of Systems  Ten systems are reviewed and are negative for acute change except as noted in the HPI   Allergies  Protonix; Codeine; Prilosec; and Warfarin sodium  Home Medications   Prior to Admission medications   Medication Sig Start Date End Date Taking? Authorizing Provider  amiodarone (PACERONE) 100 MG tablet Take 1 tablet (100 mg total) by mouth daily. 09/20/14  Yes Jettie Booze, MD  bicalutamide (CASODEX) 50 MG tablet Take 50 mg by mouth daily.  12/29/13  Yes Historical Provider, MD  clopidogrel (PLAVIX) 75 MG tablet Take 1 tablet (75 mg total) by mouth daily. 08/27/14  Yes Jettie Booze, MD  dorzolamide-timolol (COSOPT)  22.3-6.8 MG/ML ophthalmic solution Place 1 drop into both eyes 2 (two) times daily.  11/12/13  Yes Historical Provider, MD  doxazosin (CARDURA) 4 MG tablet Take 4 mg by mouth at bedtime.  06/03/14  Yes Historical Provider, MD  finasteride (PROSCAR) 5 MG tablet Take 5 mg by mouth daily. 12/08/13  Yes Historical Provider, MD  fluticasone (FLONASE) 50 MCG/ACT nasal spray Place 2 sprays into both nostrils as needed for allergies.  12/08/13  Yes Historical Provider, MD  losartan (COZAAR) 25 MG tablet Take 0.5 tablets (12.5 mg total) by mouth daily. 12/28/14  Yes Burtis Junes, NP  nitroGLYCERIN (NITROSTAT) 0.4 MG SL tablet Place 1 tablet (0.4 mg total) under the tongue every 5 (five) minutes as needed for chest pain. 09/21/13  Yes Scott T Weaver, PA-C  psyllium (METAMUCIL) 58.6 % packet Take 1 packet by mouth daily.    Yes Historical Provider, MD  traMADol (ULTRAM) 50 MG tablet Take 50 mg by mouth 3 (three) times daily. 12/23/14  Yes Historical Provider, MD  amoxicillin (AMOXIL) 500 MG capsule Take 2,000 mg by mouth See admin instructions. Take 4 capsules (2000 mg) by mouth one hour prior to dental appointments    Historical Provider, MD  docusate sodium (COLACE) 100 MG capsule Take 100 mg by mouth daily.    Historical Provider, MD  furosemide (LASIX) 40 MG tablet Take 0.5 tablets (20 mg total) by mouth daily. 01/05/15   Dayna N Dunn, PA-C  lipase/protease/amylase (CREON) 36000 UNITS CPEP capsule Take 36,000 Units by mouth 3 (three) times daily before meals.    Historical Provider, MD  LORazepam (ATIVAN) 0.5 MG tablet Take 0.5 mg by mouth every 8 (eight) hours as needed for anxiety.     Historical Provider, MD  Multiple Vitamin (MULTIVITAMIN WITH MINERALS) TABS tablet Take 1 tablet by mouth daily. Centrum Silver    Historical Provider, MD  Omega 3-Lutein-Zeaxanthin (ADVANCED EYE HEALTH PO) Take 1 tablet by mouth at bedtime.    Historical Provider, MD  OVER THE COUNTER MEDICATION Take 2 capsules by mouth 2  (two) times daily. Omega XL    Historical Provider, MD  potassium chloride SA (K-DUR,KLOR-CON) 20 MEQ tablet Take 1 tablet (20 mEq total) by mouth daily. 01/06/15   Dayna N Dunn, PA-C  Probiotic Product (PROBIOTIC PO) Take 2 tablets by mouth daily.    Historical Provider, MD  tamsulosin (FLOMAX) 0.4 MG CAPS capsule Take 0.4 mg by mouth daily.    Historical Provider, MD   BP 104/62 mmHg  Pulse 76  Temp(Src) 97.6 F (36.4 C) (Oral)  Resp 16  Ht 5\' 8"  (1.727 m)  Wt 185 lb (83.915 kg)  BMI 28.14 kg/m2  SpO2 93% Physical Exam  Constitutional: He appears well-developed and well-nourished. No distress.  HENT:  Head: Normocephalic and atraumatic.  Mouth/Throat: Oropharynx is clear and moist. No oropharyngeal exudate.  Eyes: Conjunctivae are normal. Pupils are equal, round, and reactive to light. Right eye exhibits no discharge. Left eye exhibits no discharge. No scleral icterus.  Neck: No tracheal deviation present.  Cardiovascular: Normal heart sounds and intact distal pulses.  Exam reveals no gallop and no friction rub.   No murmur heard. Irregular rate without RVR  Pulmonary/Chest: Effort normal and breath sounds normal. No respiratory distress. He has no wheezes. He has no rales. He exhibits no tenderness.  Abdominal: Soft. Bowel sounds are normal. He exhibits no distension and no mass. There is no tenderness. There is no rebound and no guarding.  Musculoskeletal: Normal range of motion. He exhibits no edema.  Lymphadenopathy:    He has no cervical adenopathy.  Neurological: He is alert. Coordination normal.  Skin: Skin is warm and dry. No rash noted. He is not diaphoretic. No erythema.  Psychiatric: He has a normal mood and affect. His behavior is normal.  Nursing note and vitals reviewed.   ED Course  Procedures (including critical care time) Labs Review Labs Reviewed  BASIC METABOLIC  PANEL - Abnormal; Notable for the following:    Chloride 97 (*)    Glucose, Bld 122 (*)     BUN 22 (*)    Creatinine, Ser 1.42 (*)    GFR calc non Af Amer 43 (*)    GFR calc Af Amer 50 (*)    All other components within normal limits  CBC - Abnormal; Notable for the following:    Platelets 146 (*)    All other components within normal limits  BRAIN NATRIURETIC PEPTIDE - Abnormal; Notable for the following:    B Natriuretic Peptide 101.4 (*)    All other components within normal limits  URINALYSIS, ROUTINE W REFLEX MICROSCOPIC (NOT AT [facility]) - Abnormal; Notable for the following:    Hgb urine dipstick SMALL (*)    All other components within normal limits  URINE MICROSCOPIC-ADD ON  I-STAT TROPOININ, ED  Randolm Idol, ED    Imaging Review DG Chest 2 View (Final result) Result time: 01/01/15 12:08:11   Final result by Rad Results In Interface (01/01/15 12:08:11)   Narrative:   CLINICAL DATA: 81 year old male with chronic productive cough. Central chest pain for the past 3 days.  EXAM: CHEST 2 VIEW  COMPARISON: Chest x-ray 12/14/2014.  FINDINGS: Lung volumes are normal. No consolidative airspace disease. No pleural effusions. No pneumothorax. No pulmonary nodule or mass noted. Pulmonary vasculature and the cardiomediastinal silhouette are within normal limits. Atherosclerosis in the thoracic aorta.  IMPRESSION: 1. No radiographic evidence of acute cardiopulmonary disease. 2. Atherosclerosis.   Electronically Signed By: Vinnie Langton M.D. On: 01/01/2015 12:08    I have personally reviewed and evaluated these images and lab results as part of my medical decision-making.   EKG Interpretation   Date/Time:  Saturday January 01 2015 11:00:59 EDT Ventricular Rate:  95 PR Interval:    QRS Duration: 171 QT Interval:  447 QTC Calculation: 562 R Axis:   -97 Text Interpretation:  Atrial fibrillation Left bundle branch block  Confirmed by ZACKOWSKI  MD, SCOTT ([id number]) on 01/01/2015 11:06:45 AM      MDM   Final diagnoses:  Weakness    Worsening weakness with intermittent CP and SOB. Patient is not in pain currently. A-fib without RVR.  Neuro exam unremarkable, and patient without numbness, facial asymmetry, focal weakness. Walked patient, and caregiver feels his gait is baseline (normally walks with cane at home). Patient became nauseous when we returned to room, so will order zofran.   Will work-up for cardiac and infectious etiology.   CBC, troponin, urinalysis, EKG unremarkable. Creatinine elevated at 1.42 (baseline 1.0-1.2). Will give IVF 50 mL/hr and encourage fluids.   BNP downtrending.   Dr. Rogene Houston saw patient. Daughter was present at bedside when he evaluated patient, and according to her, his symptoms are similar to what he was evaluated for a few days ago. Cardiologist was managing as OP, and there is discussion as to whether he needs a pacemaker or not. Patient has an appointment scheduled with cardiology on 01/03/15.   Will repeat istat troponin at 3:30.   Troponin negative. Patient may be safely discharged home. Discussed reasons for return. Patient to follow-up with primary care provider within one week. Patient scheduled to see cardiologist on 01/03/15. Patient in understanding and agreement with the plan.   Ojai Lions, PA-C 01/06/15 [phone]

## 2015-01-01 NOTE — ED Notes (Signed)
Pt reports he woke up this morning around 0130 to use the restroom when he noticed a burning in his midsternal chest that is similar to his GERD pain.  Pt reports he did became mildly SOB during that time.  Pt reports he has no chest pain at this time and has not had any since the episode.  Pt is c/o HA 4/10.

## 2015-01-01 NOTE — ED Provider Notes (Signed)
Medical screening examination/treatment/procedure(s) were conducted as a shared visit with non-physician practitioner(s) and myself.  I personally evaluated the patient during the encounter.   EKG Interpretation   Date/Time:  Saturday January 01 2015 11:00:59 EDT Ventricular Rate:  95 PR Interval:    QRS Duration: 171 QT Interval:  447 QTC Calculation: 562 R Axis:   -97 Text Interpretation:  Atrial fibrillation Left bundle branch block  Confirmed by Dann Ventress  MD, Jodeci Rini ([id number]) on 01/01/2015 11:06:45 AM      Results for orders placed or performed during the hospital encounter of 63/87/56  Basic metabolic panel  Result Value Ref Range   Sodium 136 135 - 145 mmol/L   Potassium 3.7 3.5 - 5.1 mmol/L   Chloride 97 (L) 101 - 111 mmol/L   CO2 28 22 - 32 mmol/L   Glucose, Bld 122 (H) 65 - 99 mg/dL   BUN 22 (H) 6 - 20 mg/dL   Creatinine, Ser 1.42 (H) 0.61 - 1.24 mg/dL   Calcium 9.7 8.9 - 10.3 mg/dL   GFR calc non Af Amer 43 (L) >60 mL/min   GFR calc Af Amer 50 (L) >60 mL/min   Anion gap 11 5 - 15  CBC  Result Value Ref Range   WBC 7.1 4.0 - 10.5 K/uL   RBC 4.53 4.22 - 5.81 MIL/uL   Hemoglobin 14.6 13.0 - 17.0 g/dL   HCT 41.9 39.0 - 52.0 %   MCV 92.5 78.0 - 100.0 fL   MCH 32.2 26.0 - 34.0 pg   MCHC 34.8 30.0 - 36.0 g/dL   RDW 13.1 11.5 - 15.5 %   Platelets 146 (L) 150 - 400 K/uL  Brain natriuretic peptide  Result Value Ref Range   B Natriuretic Peptide 101.4 (H) 0.0 - 100.0 pg/mL  Urinalysis, Routine w reflex microscopic (not at [facility])  Result Value Ref Range   Color, Urine YELLOW YELLOW   APPearance CLEAR CLEAR   Specific Gravity, Urine 1.007 1.005 - 1.030   pH 6.5 5.0 - 8.0   Glucose, UA NEGATIVE NEGATIVE mg/dL   Hgb urine dipstick SMALL (A) NEGATIVE   Bilirubin Urine NEGATIVE NEGATIVE   Ketones, ur NEGATIVE NEGATIVE mg/dL   Protein, ur NEGATIVE NEGATIVE mg/dL   Urobilinogen, UA 0.2 0.0 - 1.0 mg/dL   Nitrite NEGATIVE NEGATIVE   Leukocytes, UA NEGATIVE NEGATIVE  Urine  microscopic-add on  Result Value Ref Range   RBC / HPF 0-2 <3 RBC/hpf  I-stat troponin, ED (not at [facility]  Result Value Ref Range   Troponin i, poc 0.01 0.00 - 0.08 ng/mL   Comment 3           Dg Chest 2 View  01/01/2015  CLINICAL DATA:  81 year old male with chronic productive cough. Central chest pain for the past 3 days. EXAM: CHEST  2 VIEW COMPARISON:  Chest x-ray 12/14/2014. FINDINGS: Lung volumes are normal. No consolidative airspace disease. No pleural effusions. No pneumothorax. No pulmonary nodule or mass noted. Pulmonary vasculature and the cardiomediastinal silhouette are within normal limits. Atherosclerosis in the thoracic aorta. IMPRESSION: 1.  No radiographic evidence of acute cardiopulmonary disease. 2. Atherosclerosis. Electronically Signed   By: Vinnie Langton M.D.   On: 01/01/2015 12:08   Dg Chest 2 View  12/14/2014  CLINICAL DATA:  Shortness of breath for 6 months, worsening in last 2 weeks, chest pain EXAM: CHEST  2 VIEW COMPARISON:  04/05/2014 FINDINGS: No acute infiltrate or pleural effusion. No pulmonary edema. Mild hyperinflation. Thoracic spine  osteopenia. Atherosclerotic calcifications of thoracic aorta. IMPRESSION: No active cardiopulmonary disease. Electronically Signed   By: Lahoma Crocker M.D.   On: 12/14/2014 10:35    Patient seen by me. Patient also followed by cardiology. Was seen by cardiology on November 1 for similar symptoms. Of some dizziness and epigastric lower sternal chest pain. They got an echocardiogram on that day which showed an ejection fraction in the 30-40% range. Patient had had better numbers previously. There has been up discussion from cardiology about possible pacemaker. Patient also was noted to be in atrial fib. I has follow-up with them on Monday, November 7.  Workup here today patient feeling better. Troponin was negative. The chest pain patient stated started about 3 in the morning. Initial troponin was negative repeat troponin at the  3:30 PM if negative patient can be discharged home. Chest x-ray without any acute findings urinalysis negative for urinary tract infection no anemia no leukocytosis no significant electrolyte abnormalities or changes from his baseline. Little bit of increase in BUN and creatinine which could be due to some mild dehydration.  Disposition will be based on the second troponin.  Fredia Sorrow, MD 01/01/15 [phone]

## 2015-01-03 ENCOUNTER — Ambulatory Visit (INDEPENDENT_AMBULATORY_CARE_PROVIDER_SITE_OTHER): Payer: Medicare Other | Admitting: Nurse Practitioner

## 2015-01-03 ENCOUNTER — Other Ambulatory Visit: Payer: Self-pay | Admitting: Nurse Practitioner

## 2015-01-03 ENCOUNTER — Encounter: Payer: Self-pay | Admitting: Nurse Practitioner

## 2015-01-03 ENCOUNTER — Inpatient Hospital Stay (HOSPITAL_COMMUNITY)
Admission: AD | Admit: 2015-01-03 | Discharge: 2015-01-05 | DRG: 291 | Disposition: A | Discharge status: Home / Self Care | Payer: Medicare Other | Source: Ambulatory Visit | Attending: Interventional Cardiology | Admitting: Interventional Cardiology

## 2015-01-03 VITALS — BP 118/76 | HR 89 | Ht 68.0 in | Wt 190.4 lb

## 2015-01-03 DIAGNOSIS — I5020 Unspecified systolic (congestive) heart failure: Secondary | ICD-10-CM

## 2015-01-03 DIAGNOSIS — E876 Hypokalemia: Secondary | ICD-10-CM | POA: Diagnosis present

## 2015-01-03 DIAGNOSIS — Z79899 Other long term (current) drug therapy: Secondary | ICD-10-CM | POA: Diagnosis not present

## 2015-01-03 DIAGNOSIS — I130 Hypertensive heart and chronic kidney disease with heart failure and stage 1 through stage 4 chronic kidney disease, or unspecified chronic kidney disease: Secondary | ICD-10-CM | POA: Diagnosis present

## 2015-01-03 DIAGNOSIS — H9190 Unspecified hearing loss, unspecified ear: Secondary | ICD-10-CM | POA: Diagnosis present

## 2015-01-03 DIAGNOSIS — Z9119 Patient's noncompliance with other medical treatment and regimen: Secondary | ICD-10-CM | POA: Diagnosis not present

## 2015-01-03 DIAGNOSIS — I480 Paroxysmal atrial fibrillation: Secondary | ICD-10-CM | POA: Diagnosis present

## 2015-01-03 DIAGNOSIS — I340 Nonrheumatic mitral (valve) insufficiency: Secondary | ICD-10-CM | POA: Diagnosis present

## 2015-01-03 DIAGNOSIS — R5383 Other fatigue: Secondary | ICD-10-CM | POA: Diagnosis not present

## 2015-01-03 DIAGNOSIS — Z9842 Cataract extraction status, left eye: Secondary | ICD-10-CM | POA: Diagnosis not present

## 2015-01-03 DIAGNOSIS — R627 Adult failure to thrive: Secondary | ICD-10-CM | POA: Diagnosis present

## 2015-01-03 DIAGNOSIS — Z961 Presence of intraocular lens: Secondary | ICD-10-CM | POA: Diagnosis present

## 2015-01-03 DIAGNOSIS — I519 Heart disease, unspecified: Secondary | ICD-10-CM | POA: Diagnosis not present

## 2015-01-03 DIAGNOSIS — E871 Hypo-osmolality and hyponatremia: Secondary | ICD-10-CM | POA: Diagnosis present

## 2015-01-03 DIAGNOSIS — R531 Weakness: Secondary | ICD-10-CM

## 2015-01-03 DIAGNOSIS — I5023 Acute on chronic systolic (congestive) heart failure: Secondary | ICD-10-CM | POA: Diagnosis not present

## 2015-01-03 DIAGNOSIS — G4733 Obstructive sleep apnea (adult) (pediatric): Secondary | ICD-10-CM | POA: Diagnosis present

## 2015-01-03 DIAGNOSIS — R404 Transient alteration of awareness: Secondary | ICD-10-CM | POA: Diagnosis not present

## 2015-01-03 DIAGNOSIS — I5043 Acute on chronic combined systolic (congestive) and diastolic (congestive) heart failure: Secondary | ICD-10-CM | POA: Diagnosis present

## 2015-01-03 DIAGNOSIS — Z87891 Personal history of nicotine dependence: Secondary | ICD-10-CM

## 2015-01-03 DIAGNOSIS — Z9841 Cataract extraction status, right eye: Secondary | ICD-10-CM | POA: Diagnosis not present

## 2015-01-03 DIAGNOSIS — Z8719 Personal history of other diseases of the digestive system: Secondary | ICD-10-CM

## 2015-01-03 DIAGNOSIS — I447 Left bundle-branch block, unspecified: Secondary | ICD-10-CM | POA: Diagnosis present

## 2015-01-03 DIAGNOSIS — I252 Old myocardial infarction: Secondary | ICD-10-CM

## 2015-01-03 DIAGNOSIS — R0602 Shortness of breath: Secondary | ICD-10-CM | POA: Diagnosis not present

## 2015-01-03 DIAGNOSIS — Z86011 Personal history of benign neoplasm of the brain: Secondary | ICD-10-CM | POA: Diagnosis not present

## 2015-01-03 DIAGNOSIS — N183 Chronic kidney disease, stage 3 (moderate): Secondary | ICD-10-CM | POA: Diagnosis present

## 2015-01-03 DIAGNOSIS — Z955 Presence of coronary angioplasty implant and graft: Secondary | ICD-10-CM | POA: Diagnosis not present

## 2015-01-03 DIAGNOSIS — M545 Low back pain: Secondary | ICD-10-CM | POA: Diagnosis present

## 2015-01-03 DIAGNOSIS — Z96653 Presence of artificial knee joint, bilateral: Secondary | ICD-10-CM | POA: Diagnosis present

## 2015-01-03 DIAGNOSIS — F419 Anxiety disorder, unspecified: Secondary | ICD-10-CM | POA: Diagnosis present

## 2015-01-03 DIAGNOSIS — F329 Major depressive disorder, single episode, unspecified: Secondary | ICD-10-CM | POA: Diagnosis present

## 2015-01-03 DIAGNOSIS — Z8546 Personal history of malignant neoplasm of prostate: Secondary | ICD-10-CM | POA: Diagnosis not present

## 2015-01-03 DIAGNOSIS — R001 Bradycardia, unspecified: Secondary | ICD-10-CM | POA: Diagnosis present

## 2015-01-03 DIAGNOSIS — I5022 Chronic systolic (congestive) heart failure: Secondary | ICD-10-CM

## 2015-01-03 DIAGNOSIS — I255 Ischemic cardiomyopathy: Secondary | ICD-10-CM

## 2015-01-03 DIAGNOSIS — K219 Gastro-esophageal reflux disease without esophagitis: Secondary | ICD-10-CM | POA: Diagnosis present

## 2015-01-03 DIAGNOSIS — I2510 Atherosclerotic heart disease of native coronary artery without angina pectoris: Secondary | ICD-10-CM | POA: Diagnosis present

## 2015-01-03 DIAGNOSIS — Z7902 Long term (current) use of antithrombotics/antiplatelets: Secondary | ICD-10-CM

## 2015-01-03 DIAGNOSIS — I429 Cardiomyopathy, unspecified: Secondary | ICD-10-CM | POA: Diagnosis present

## 2015-01-03 DIAGNOSIS — I4891 Unspecified atrial fibrillation: Secondary | ICD-10-CM | POA: Diagnosis not present

## 2015-01-03 DIAGNOSIS — G8929 Other chronic pain: Secondary | ICD-10-CM | POA: Diagnosis present

## 2015-01-03 DIAGNOSIS — Z96643 Presence of artificial hip joint, bilateral: Secondary | ICD-10-CM | POA: Diagnosis present

## 2015-01-03 DIAGNOSIS — R42 Dizziness and giddiness: Secondary | ICD-10-CM | POA: Diagnosis not present

## 2015-01-03 DIAGNOSIS — I509 Heart failure, unspecified: Secondary | ICD-10-CM | POA: Diagnosis not present

## 2015-01-03 HISTORY — DX: Chronic kidney disease, stage 3 (moderate): N18.3

## 2015-01-03 HISTORY — DX: Chronic systolic (congestive) heart failure: I50.22

## 2015-01-03 HISTORY — DX: Chronic kidney disease, stage 3 unspecified: N18.30

## 2015-01-03 HISTORY — DX: Left bundle-branch block, unspecified: I44.7

## 2015-01-03 LAB — COMPREHENSIVE METABOLIC PANEL
ALT: 18 U/L (ref 17–63)
AST: 23 U/L (ref 15–41)
Albumin: 4.1 g/dL (ref 3.5–5.0)
Alkaline Phosphatase: 65 U/L (ref 38–126)
Anion gap: 9 (ref 5–15)
BUN: 20 mg/dL (ref 6–20)
CO2: 28 mmol/L (ref 22–32)
Calcium: 9.3 mg/dL (ref 8.9–10.3)
Chloride: 94 mmol/L — ABNORMAL LOW (ref 101–111)
Creatinine, Ser: 1.46 mg/dL — ABNORMAL HIGH (ref 0.61–1.24)
GFR calc Af Amer: 48 mL/min — ABNORMAL LOW (ref >60)
GFR calc non Af Amer: 42 mL/min — ABNORMAL LOW (ref >60)
Glucose, Bld: 121 mg/dL — ABNORMAL HIGH (ref 65–99)
Potassium: 3.7 mmol/L (ref 3.5–5.1)
Sodium: 131 mmol/L — ABNORMAL LOW (ref 135–145)
Total Bilirubin: 1.1 mg/dL (ref 0.3–1.2)
Total Protein: 6.1 g/dL — ABNORMAL LOW (ref 6.5–8.1)

## 2015-01-03 LAB — CBC WITH DIFFERENTIAL/PLATELET
Basophils Absolute: 0 10*3/uL (ref 0.0–0.1)
Basophils Relative: 0 %
Eosinophils Absolute: 0.1 10*3/uL (ref 0.0–0.7)
Eosinophils Relative: 1 %
HCT: 39.6 % (ref 39.0–52.0)
Hemoglobin: 13.8 g/dL (ref 13.0–17.0)
Lymphocytes Relative: 17 %
Lymphs Abs: 1.2 10*3/uL (ref 0.7–4.0)
MCH: 32 pg (ref 26.0–34.0)
MCHC: 34.8 g/dL (ref 30.0–36.0)
MCV: 91.9 fL (ref 78.0–100.0)
Monocytes Absolute: 0.9 10*3/uL (ref 0.1–1.0)
Monocytes Relative: 12 %
Neutro Abs: 5.2 10*3/uL (ref 1.7–7.7)
Neutrophils Relative %: 70 %
Platelets: 145 10*3/uL — ABNORMAL LOW (ref 150–400)
RBC: 4.31 MIL/uL (ref 4.22–5.81)
RDW: 12.9 % (ref 11.5–15.5)
WBC: 7.4 10*3/uL (ref 4.0–10.5)

## 2015-01-03 LAB — PROTIME-INR
INR: 1.12 (ref 0.00–1.49)
Prothrombin Time: 14.6 seconds (ref 11.6–15.2)

## 2015-01-03 LAB — BRAIN NATRIURETIC PEPTIDE: B Natriuretic Peptide: 111.2 pg/mL — ABNORMAL HIGH (ref 0.0–100.0)

## 2015-01-03 LAB — TSH: TSH: 0.932 u[IU]/mL (ref 0.350–4.500)

## 2015-01-03 LAB — APTT: aPTT: 28 seconds (ref 24–37)

## 2015-01-03 LAB — TROPONIN I
Troponin I: <0.03 ng/mL (ref <0.031)
Troponin I: <0.03 ng/mL (ref <0.031)
Troponin I: <0.03 ng/mL (ref <0.031)

## 2015-01-03 LAB — MAGNESIUM: Magnesium: 2.1 mg/dL (ref 1.7–2.4)

## 2015-01-03 MED ORDER — FINASTERIDE 5 MG PO TABS
5.0000 mg | ORAL_TABLET | Freq: Every day | ORAL | Status: DC
  Administered 2015-01-04 – 2015-01-05 (×2): 5 mg via ORAL
  Filled 2015-01-03 (×2): qty 1

## 2015-01-03 MED ORDER — BICALUTAMIDE 50 MG PO TABS
50.0000 mg | ORAL_TABLET | Freq: Every day | ORAL | Status: DC
  Administered 2015-01-04 – 2015-01-05 (×2): 50 mg via ORAL
  Filled 2015-01-03 (×3): qty 1

## 2015-01-03 MED ORDER — ACETAMINOPHEN 325 MG PO TABS
650.0000 mg | ORAL_TABLET | ORAL | Status: DC | PRN
  Administered 2015-01-04: 650 mg via ORAL
  Filled 2015-01-03: qty 2

## 2015-01-03 MED ORDER — SODIUM CHLORIDE 0.9 % IJ SOLN: 3.0000 mL | INTRAMUSCULAR | Status: DC | PRN

## 2015-01-03 MED ORDER — LOSARTAN POTASSIUM 25 MG PO TABS
12.5000 mg | ORAL_TABLET | Freq: Every day | ORAL | Status: DC
  Administered 2015-01-04 – 2015-01-05 (×2): 12.5 mg via ORAL
  Filled 2015-01-03 (×2): qty 1

## 2015-01-03 MED ORDER — POLYETHYLENE GLYCOL 3350 17 G PO PACK
17.0000 g | PACK | Freq: Every day | ORAL | Status: DC
  Administered 2015-01-03 – 2015-01-05 (×3): 17 g via ORAL
  Filled 2015-01-03 (×3): qty 1

## 2015-01-03 MED ORDER — SODIUM CHLORIDE 0.9 % IV SOLN: 250.0000 mL | INTRAVENOUS | Status: DC | PRN

## 2015-01-03 MED ORDER — DORZOLAMIDE HCL-TIMOLOL MAL 2-0.5 % OP SOLN
1.0000 [drp] | Freq: Two times a day (BID) | OPHTHALMIC | Status: DC
  Administered 2015-01-03 – 2015-01-05 (×4): 1 [drp] via OPHTHALMIC
  Filled 2015-01-03: qty 10

## 2015-01-03 MED ORDER — FUROSEMIDE 10 MG/ML IJ SOLN
20.0000 mg | Freq: Two times a day (BID) | INTRAMUSCULAR | Status: DC
  Administered 2015-01-03 – 2015-01-04 (×2): 20 mg via INTRAVENOUS
  Filled 2015-01-03: qty 2

## 2015-01-03 MED ORDER — ONDANSETRON HCL 4 MG/2ML IJ SOLN: 4.0000 mg | Freq: Four times a day (QID) | INTRAMUSCULAR | Status: DC | PRN

## 2015-01-03 MED ORDER — AMIODARONE HCL 100 MG PO TABS
100.0000 mg | ORAL_TABLET | Freq: Every day | ORAL | Status: DC
  Administered 2015-01-04 – 2015-01-05 (×2): 100 mg via ORAL
  Filled 2015-01-03 (×2): qty 1

## 2015-01-03 MED ORDER — FLUTICASONE PROPIONATE 50 MCG/ACT NA SUSP
2.0000 | Freq: Every day | NASAL | Status: DC
  Administered 2015-01-04 – 2015-01-05 (×2): 2 via NASAL
  Filled 2015-01-03 (×2): qty 16

## 2015-01-03 MED ORDER — SODIUM CHLORIDE 0.9 % IJ SOLN
3.0000 mL | Freq: Two times a day (BID) | INTRAMUSCULAR | Status: DC
  Administered 2015-01-03 – 2015-01-05 (×5): 3 mL via INTRAVENOUS

## 2015-01-03 MED ORDER — DOXAZOSIN MESYLATE 8 MG PO TABS
4.0000 mg | ORAL_TABLET | Freq: Every day | ORAL | Status: DC
  Administered 2015-01-03 – 2015-01-05 (×3): 4 mg via ORAL
  Filled 2015-01-03 (×3): qty 1

## 2015-01-03 MED ORDER — FUROSEMIDE 10 MG/ML IJ SOLN
20.0000 mg | Freq: Two times a day (BID) | INTRAMUSCULAR | Status: DC
  Administered 2015-01-03: 20 mg via INTRAVENOUS
  Filled 2015-01-03: qty 2

## 2015-01-03 MED ORDER — FUROSEMIDE 10 MG/ML IJ SOLN
INTRAMUSCULAR | Status: AC
  Filled 2015-01-03: qty 2

## 2015-01-03 MED ORDER — TAMSULOSIN HCL 0.4 MG PO CAPS
0.4000 mg | ORAL_CAPSULE | Freq: Every day | ORAL | Status: DC
  Administered 2015-01-04 – 2015-01-05 (×2): 0.4 mg via ORAL
  Filled 2015-01-03 (×2): qty 1

## 2015-01-03 MED ORDER — FAMOTIDINE 20 MG PO TABS
20.0000 mg | ORAL_TABLET | Freq: Every day | ORAL | Status: DC
  Administered 2015-01-04 – 2015-01-05 (×2): 20 mg via ORAL
  Filled 2015-01-03 (×2): qty 1

## 2015-01-03 MED ORDER — FUROSEMIDE 10 MG/ML IJ SOLN: 20.0000 mg | Freq: Two times a day (BID) | INTRAMUSCULAR | Status: DC

## 2015-01-03 NOTE — Progress Notes (Signed)
CARDIOLOGY OFFICE NOTE  Date:  01/03/2015    [name] Date of Birth: [birth year] Medical Record #[id number]  PCP:  Nathan Hatchet, MD  Cardiologist:  [name]    Chief Complaint  Patient presents with  . Atrial Fibrillation    Follow up visit - seen for [name] and [name]    History of Present Illness: [name] is a 81 y.o. male who presents today for a follow up visit. This is a  5 day check. Seen for [name] and [name]. He has a history of atrial fib - NSR on Amiodarone, bradycardia, LBBB, past GI bleed, and CAD. He had a complex PCI of his LAD in July 2015 involving rotational atherectomy and DES. Prior to the intervention, his ejection fraction was in the 30-35% range. If his EF had remained depressed Dr Lovena [name] was going to place a BiV pacemaker. Echocardiogram about a month after the intervention showed an improvement in his EF from 50-55%. He has not been on chronic anticoagulation due to prior history of GI bleeding.   Seen back in April by [name] in the flex clinic for atypical chest pain. A Myoview was done that showed an EF of 30-35% but no ischemia - low risk. He was also noted to have sinus bradycardia with HR in the 40's. He is not on beta blocker therapy. It was hard to tell if he was symptomatic from that. He has lots of symptoms and anxiety as a baseline. He has chronic back pain and DJD that does limit his activity.   Seen back here in May by [name], [name]. He was bradycardic - discussion with [name] - no CHF on exam and no clear history of symptomatic bradycardia. Dr Lovena [name] did not recommend a pacemaker at that time. If the pt was to have clear cut symptoms of bradycardia - syncope or near syncope - we would then consider it. [name] did not want to place an ICD in this 81 y/o but would consider a BiV pacemaker if his LVF was confirmed to be significantly depressed by echo. Last echo looked to be from August of  2015.   CHADSVASC is at least a 5 with a 6.7% annual stroke risk.   I saw him last week. He was feeling bad. Weak and short of breath. He was in AF with a controlled VR. He had a limited echo showing EF down to 30 to 35%. Discussed with [name] - in light of multiple past GI bleeds - he was not felt to be a candidate for anticoagulation and wanted to try and manage medically. Pacemaker was not warranted due to controlled VR - no signs of bradycardia. I diuresed him with some Lasix (he was not on previously). Also cut his ARB back.   Has been to the ER this past Saturday - multitude of symptoms - looks to still be in AF. Had chest pain. Troponin negative. He was noted to be feeling better and was sent back home. UA was negative for UTI. Little increase in BUN/creat - may have been dehydrated. Was told to come back here for follow up.  Comes back today. Here with his caregiver - [name]. He feels bad. Needs to lie down. Nauseated. Fell in the floor last night - could not get up for over 30 minutes - so weak. Remains short of breath. Remains dizzy. Belly is a little softer but he still feels bloated. No  swelling in his legs. Very hard to get here today.  Looks like his weight did drop with the addition of lasix - remains down about 4 pounds. He is still dizzy - feels like he is still going to pass out. Has a headache.   Past Medical History  Diagnosis Date  . PAF (paroxysmal atrial fibrillation) (HCC)     controlled w/ amiodarone; not on coumadin due to hx of GI bleed  . Mitral regurgitation   . GERD (gastroesophageal reflux disease)     occ. take prevacid  . Meningioma ([place]) right -sided w/ right VI palsy    followed by dr Gaynell Face  . Diverticulitis of colon with bleeding     s/p sigmoid resection '88  . History of GI diverticular bleed april 2012    transfused blood and resolved without surgical intervention  . Impotence, organic     s/p penile prosthesis 1990's  . BPH (benign prostatic  hypertrophy) with urinary obstruction     s/p turp yrs ago  . Blood transfusion     "related to a surgery"  . Impaired hearing bilateral     only left hearing aid  . Bradycardia     Amio d/c'd 08/2013; brady arrest 08/2013 after PCI >>> recurrent AF >>> Amiodarone restarted (may need BiV pacer if EF does not recover; consider Holter if + sx's)  . Cardiomyopathy ([place])     a. Echo (08/2013):  EF 30-35%, AS hypokinesis, Gr 1 diast dysfn, mild MR, mild LAE >>> b. improved EF 50-55% by echo 8/15  . CAD (coronary artery disease)     a. s/p MI and prior PCI of LAD;  b. LHC (08/2013):  prox LAD 60-70%, mid LAD stents ok, ostial lesion at Dx jailed by stent, mild CFX and RCA disesase >>>  PCI (09/08/13):  rotational atherectomy + Promus DES to prox LAD  . Mitral valve disorders   . Hx of echocardiogram     Echo (8/15):  Mod LVH, EF 50-55%, Gr 1 DD, mild MR, mild RAE  . Prostate cancer ([place]) 11/30/13    Gleason 8, volume 22.14 cc  . Atrial fibrillation ([place])   . Heart murmur     with mitral valve regurgitation  . Anxiety   . Depression   . Hypertension   . CHF (congestive heart failure) ([place])   . Myocardial infarction [facility]) 1980's- medical intervention    "so mild I didn't know I'd had it"  . Sleep apnea     non-compliant cpap  . DJD (degenerative joint disease) hips and knees    s/p bilateral total replacements  . DDD (degenerative disc disease)     chronic back pain  . Arthritis     "I'm covered up w/it"  . Chronic lower back pain   . Incomplete bladder emptying     Past Surgical History  Procedure Laterality Date  . Cholecystectomy    . Total hip arthroplasty Right 03-25-08--  . Total hip arthroplasty Left 2005  . Total knee arthroplasty Right 2004  . Total knee arthroplasty Left 1997  . Inguinal hernia repair Bilateral   . Cataract extraction w/ intraocular lens  implant, bilateral Bilateral ~ 2000  . Appendectomy    . Transurethral resection of prostate  "years ago"  . Penile  prosthesis implant  1990's  . Shoulder open rotator cuff repair Left   . Inner ear surgery Right yrs ago    "trying to get my hearing back  . Cardiac catheterization  2007    noncritical cad (results w/ chart)  . Coronary angioplasty with stent placement  08-03-08    drug-eluting stent x2 distal and mid lad  . Transurethral resection of prostate  01/08/2011    Procedure: TRANSURETHRAL RESECTION OF THE PROSTATE (TURP);  Surgeon: Franchot Gallo;  Location: [facility];  Service: Urology;  Laterality: N/A;  GYRUS   . Prostate biopsy  11/30/13    Gleason 8, vol 22.14 cc  . Gamma knife radiation  2000    [facility] for meningioma, last eval 2013- no change  . Left heart catheterization with coronary angiogram N/A 09/04/2013    Procedure: LEFT HEART CATHETERIZATION WITH CORONARY ANGIOGRAM;  Surgeon: Jettie Booze, MD;  Location: [facility] CATH LAB;  Service: Cardiovascular;  Laterality: N/A;  . Percutaneous coronary rotoblator intervention (pci-r) N/A 09/08/2013    Procedure: PERCUTANEOUS CORONARY ROTOBLATOR INTERVENTION (PCI-R);  Surgeon: Jettie Booze, MD;  Location: [facility] CATH LAB;  Service: Cardiovascular;  Laterality: N/A;  . Revision total knee arthroplasty Right   . Total hip revision Right 3-4 times  . Closed reduction hip dislocation Right "several"  . Esophagogastroduodenoscopy N/A 02/11/2014    Procedure: ESOPHAGOGASTRODUODENOSCOPY (EGD);  Surgeon: Cleotis Nipper, MD;  Location: [facility] ENDOSCOPY;  Service: Endoscopy;  Laterality: N/A;     Medications: Current Outpatient Prescriptions  Medication Sig Dispense Refill  . amiodarone (PACERONE) 100 MG tablet Take 1 tablet (100 mg total) by mouth daily. 30 tablet 6  . amoxicillin (AMOXIL) 500 MG capsule TK 4 CS PO 1 HOUR PRIOR TO APPOINTMENT  0  . bicalutamide (CASODEX) 50 MG tablet Take 50 mg by mouth daily.   3  . clopidogrel (PLAVIX) 75 MG tablet Take 1 tablet (75 mg total) by mouth daily. 30 tablet 5  . CREON 36000  UNITS CPEP capsule TK 1 TO 3 CS PO WITH EACH MEAL PRN  3  . dorzolamide-timolol (COSOPT) 22.3-6.8 MG/ML ophthalmic solution Place 1 drop into both eyes 2 (two) times daily.   2  . doxazosin (CARDURA) 4 MG tablet Take 4 mg by mouth daily.  0  . finasteride (PROSCAR) 5 MG tablet Take 5 mg by mouth daily.    . fluticasone (FLONASE) 50 MCG/ACT nasal spray Place 2 sprays into both nostrils as needed for allergies.   1  . furosemide (LASIX) 40 MG tablet TAKE 1&1/2 TABLETS BY MOUTH DAILY FOR 3 DAYS THEN 1 TABLET DAILY 90 tablet 3  . LORazepam (ATIVAN) 0.5 MG tablet Take 0.5 mg by mouth every 8 (eight) hours.    [name] losartan (COZAAR) 25 MG tablet Take 0.5 tablets (12.5 mg total) by mouth daily. 30 tablet 3  . Multiple Vitamin (MULTIVITAMIN) tablet Take 1 tablet by mouth daily.      . nitroGLYCERIN (NITROSTAT) 0.4 MG SL tablet Place 1 tablet (0.4 mg total) under the tongue every 5 (five) minutes as needed for chest pain. 20 tablet 0  . OVER THE COUNTER MEDICATION Take 1 tablet by mouth 2 (two) times daily. Vitamins for eyes    . OVER THE COUNTER MEDICATION Take 2 tablets by mouth daily. [facility] brand for prostate health    . Pancrelipase, Lip-Prot-Amyl, (CREON PO) Take 1 tablet by mouth daily.     . pantoprazole (PROTONIX) 40 MG tablet TK 1 T PO D  5  . Probiotic Product (PROBIOTIC DAILY PO) Take 1 capsule by mouth daily.    . psyllium (METAMUCIL) 58.6 % packet Take 1 packet by mouth daily as needed (  for constipation).    . ranitidine (ZANTAC) 150 MG tablet Take 150-300 mg by mouth 2 (two) times daily as needed for heartburn.     . traMADol (ULTRAM) 50 MG tablet Take 50 mg by mouth 3 (three) times daily.     No current facility-administered medications for this visit.    Allergies: Allergies  Allergen Reactions  . Codeine Other (See Comments) and Anxiety    Insomnia/ hyper  . Prilosec [Omeprazole] Other (See Comments) and Nausea And Vomiting    GI upset, insomnia GI upset, insomnia  . Warfarin  Sodium Other (See Comments) and Anxiety    Hx gi bleed Hx gi bleed    Social History: The patient  reports that he quit smoking about 56 years ago. His smoking use included Cigarettes. He has a 14 pack-year smoking history. He has never used smokeless tobacco. He reports that he drinks alcohol. He reports that he does not use illicit drugs.   Family History: The patient's family history includes Cancer in his brother and another family member; Emphysema in his father; Heart attack in his mother; Heart disease in his mother.   Review of Systems: Please see the history of present illness.   Otherwise, the review of systems is positive for none.   All other systems are reviewed and negative.   Physical Exam: VS:  BP 118/76 mmHg  Pulse 89  Ht 5\' 8"  (1.727 m)  Wt 190 lb 6.4 oz (86.365 kg)  BMI 28.96 kg/m2  SpO2 91% .  BMI Body mass index is 28.96 kg/(m^2).  Wt Readings from Last 3 Encounters:  01/03/15 190 lb 6.4 oz (86.365 kg)  01/01/15 185 lb (83.915 kg)  12/28/14 194 lb 6.4 oz (88.179 kg)    General: Elderly male. Looks chronically ill. Color is sallow. He is in no acute distress. HEENT: Normal. Neck: Supple, no JVD, carotid bruits, or masses noted.  Cardiac: Irregular irregular rhythm. Rate is ok. Heart tones are distant today. No edema.  Respiratory:  Lungs are clear to auscultation bilaterally with normal work of breathing.  GI: Soft and nontender. Not as distended as last week.  MS: No deformity or atrophy. Gait unsteady.  Skin: Warm and dry. Color is sallow. Neuro:  Strength and sensation are intact and no gross focal deficits noted.  Psych: Alert, appropriate and with normal affect.   LABORATORY DATA:  EKG:  EKG is not ordered today.  Lab Results  Component Value Date   WBC 7.1 01/01/2015   HGB 14.6 01/01/2015   HCT 41.9 01/01/2015   PLT 146* 01/01/2015   GLUCOSE 122* 01/01/2015   ALT 14 12/28/2014   AST 18 12/28/2014   NA 136 01/01/2015   K 3.7 01/01/2015    CL 97* 01/01/2015   CREATININE 1.42* 01/01/2015   BUN 22* 01/01/2015   CO2 28 01/01/2015   TSH 1.901 12/28/2014   INR 1.17 02/10/2014   HGBA1C 5.4 02/10/2014    BNP (last 3 results)  Recent Labs  01/01/15 1206  BNP 101.4*    ProBNP (last 3 results)  Recent Labs  11/30/14 1604  PROBNP 150.0*     Other Studies Reviewed Today:  Myoview Study Highlights from 06/2014    Intermediate stress nuclear study with chest tightness; ECG uninterpretable due to baseline LBBB; large, severe, fixed inferoseptal and apical defect consistent with prior infarct vs LBBB; no ischemia; moderate global reduction in LV function and LVE; study intermediate risk due to reduced LV function.  Echo Study Conclusions from 11/2014  - Left ventricle: Diffuse hypokinesis abnormal septal motion The cavity size was moderately dilated. Wall thickness was increased in a pattern of moderate LVH. Systolic function was moderately to severely reduced. The estimated ejection fraction was in the range of 30% to 35%. Doppler parameters are consistent with abnormal left ventricular relaxation (grade 1 diastolic dysfunction). - Mitral valve: Anteriorly directed MR with restricted posterior leaflet There was mild to moderate regurgitation. - Left atrium: The atrium was mildly dilated. - Atrial septum: No defect or patent foramen ovale was identified.   Echo Study Conclusions from 09/2013 - Left ventricle: The cavity size was normal. Wall thickness was increased in a pattern of moderate LVH. Systolic function was normal. The estimated ejection fraction was in the range of 50% to 55%. There was an increased relative contribution of atrial contraction to ventricular filling. Doppler parameters are consistent with abnormal left ventricular relaxation (grade 1 diastolic dysfunction). - Mitral valve: There was mild regurgitation. - Right atrium: The atrium was mildly  dilated.  Assessment/Plan:  1. PAF - uncertain duration. Discussed with [name] last week - not felt to be a good candidate for anticoagulation given history of recurrent GI bleed. HR seems controlled but needs to be monitored for recurrent bradycardia. Unfortunately, stroke risk remains high. We have no real good options here and this has been explained to the patient several times. He wishes for "everything possible". Lots of active symptoms today - admitting to [facility] for further disposition.   2. History of bradycardia - rate is currently ok on no medicines. Pacemaker not warranted at this time but will monitor on telemetry and consult [name] or EP as warranted. [name] also felt like Watchman was not an option as well.   3. CAD - no active chest pain - he is over one year out from his PCI. He remains on his Plavix.   4. LV dysfunction - noted by Myoview earlier this year - most recent echo last week with EF of 30 to 35%. Most likely exacerbated by his AF. Multitude of symptoms - he feels poorly - looks sallow. Will admit to telemetry. Check labs. Try to manage medically but has limited options.    5. Advanced age - now with generalized failure to thrive. Admitting for further management. Discussed with Dr. Mare Ferrari and he is in agreement with this plan of care. He will be transferred by EMS.       Current medicines are reviewed with the patient today.  The patient does not have concerns regarding medicines other than what has been noted above.  The following changes have been made:  See above.  Labs/ tests ordered today include:   No orders of the defined types were placed in this encounter.     Disposition:   FU    Patient is agreeable to this plan and will call if any problems develop in the interim.   Signed: Burtis Junes, RN, ANP-C 01/03/2015 9:50 AM  [facility] [address] [place], [place]  [address] Phone: [phone] Fax: [phone]

## 2015-01-03 NOTE — H&P (Signed)
[name]  01/03/2015 9:30 AM  Office Visit  MRN:  [id number]   Description: Male DOB: 09-20-1926  Provider: Burtis Junes, NP  Department: [facility] [address] [facility]       Vital Signs  Most recent update: 01/03/2015 9:34 AM by [name]    BP Pulse Ht Wt BMI SpO2    118/76 mmHg 89 5\' 8"  (1.727 m) 190 lb 6.4 oz (86.365 kg) 28.96 kg/m2 91%    Vitals History     Progress Notes      Nathan Junes, NP at 01/03/2015 9:18 AM     Status: Signed       Expand All Collapse All       CARDIOLOGY OFFICE NOTE  Date: 01/03/2015    [name] Date of Birth: 13-Jun-1926 Medical Record #[id number]  PCP: Nathan Hatchet, MD Cardiologist: [name]   Chief Complaint  Patient presents with  . Atrial Fibrillation    Follow up visit - seen for Dr. Irish [name] and Dr. Lovena [name]    History of Present Illness: [name] is a 81 y.o. male who presents today for a follow up visit. This is a 5 day check. Seen for Dr. Irish [name] and Dr. Lovena [name]. He has a history of atrial fib - NSR on Amiodarone, bradycardia, LBBB, past GI bleed, and CAD. He had a complex PCI of his LAD in July 2015 involving rotational atherectomy and DES. Prior to the intervention, his ejection fraction was in the 30-35% range. If his EF had remained depressed Dr [name] was going to place a BiV pacemaker. Echocardiogram about a month after the intervention showed an improvement in his EF from 50-55%. He has not been on chronic anticoagulation due to prior history of GI bleeding.   Seen back in April by [name] in the flex clinic for atypical chest pain. A Myoview was done that showed an EF of 30-35% but no ischemia - low risk. He was also noted to have sinus bradycardia with HR in the 40's. He is not on beta blocker therapy. It was hard to tell if he was symptomatic from that. He has lots of symptoms and anxiety as a baseline. He has chronic back pain and DJD that does limit  his activity.   Seen back here in May by [name], [place]. He was bradycardic - discussion with Dr. Lovena [name] - no CHF on exam and no clear history of symptomatic bradycardia. Dr [name] did not recommend a pacemaker at that time. If the pt was to have clear cut symptoms of bradycardia - syncope or near syncope - we would then consider it. Dr. Lovena [name] did not want to place an ICD in this 81 y/o but would consider a BiV pacemaker if his LVF was confirmed to be significantly depressed by echo. Last echo looked to be from August of 2015.   CHADSVASC is at least a 5 with a 6.7% annual stroke risk.   I saw him last week. He was feeling bad. Weak and short of breath. He was in AF with a controlled VR. He had a limited echo showing EF down to 30 to 35%. Discussed with Dr. Lovena [name] - in light of multiple past GI bleeds - he was not felt to be a candidate for anticoagulation and wanted to try and manage medically. Pacemaker was not warranted due to controlled VR - no signs of bradycardia. I diuresed him with some Lasix (he was not on previously). Also cut his ARB  back.   Has been to the ER this past Saturday - multitude of symptoms - looks to still be in AF. Had chest pain. Troponin negative. He was noted to be feeling better and was sent back home. UA was negative for UTI. Little increase in BUN/creat - may have been dehydrated. Was told to come back here for follow up.  Comes back today. Here with his caregiver - [name]. He feels bad. Needs to lie down. Nauseated. Fell in the floor last night - could not get up for over 30 minutes - so weak. Remains short of breath. Remains dizzy. Belly is a little softer but he still feels bloated. No swelling in his legs. Very hard to get here today. Looks like his weight did drop with the addition of lasix - remains down about 4 pounds. He is still dizzy - feels like he is still going to pass out. Has a headache.   Past Medical History  Diagnosis Date  . PAF (paroxysmal  atrial fibrillation) (HCC)     controlled w/ amiodarone; not on coumadin due to hx of GI bleed  . Mitral regurgitation   . GERD (gastroesophageal reflux disease)     occ. take prevacid  . Meningioma ([name]) right -sided w/ right VI palsy    followed by dr [name]  . Diverticulitis of colon with bleeding     s/p sigmoid resection '88  . History of GI diverticular bleed april 2012    transfused blood and resolved without surgical intervention  . Impotence, organic     s/p penile prosthesis 1990's  . BPH (benign prostatic hypertrophy) with urinary obstruction     s/p turp yrs ago  . Blood transfusion     "related to a surgery"  . Impaired hearing bilateral     only left hearing aid  . Bradycardia     Amio d/c'd 08/2013; brady arrest 08/2013 after PCI >>> recurrent AF >>> Amiodarone restarted (may need BiV pacer if EF does not recover; consider Holter if + sx's)  . Cardiomyopathy ([place])     a. Echo (08/2013): EF 30-35%, AS hypokinesis, Gr 1 diast dysfn, mild MR, mild LAE >>> b. improved EF 50-55% by echo 8/15  . CAD (coronary artery disease)     a. s/p MI and prior PCI of LAD; b. LHC (08/2013): prox LAD 60-70%, mid LAD stents ok, ostial lesion at Dx jailed by stent, mild CFX and RCA disesase >>> PCI (09/08/13): rotational atherectomy + Promus DES to prox LAD  . Mitral valve disorders   . Hx of echocardiogram     Echo (8/15): Mod LVH, EF 50-55%, Gr 1 DD, mild MR, mild RAE  . Prostate cancer ([place]) 11/30/13    Gleason 8, volume 22.14 cc  . Atrial fibrillation ([place])   . Heart murmur     with mitral valve regurgitation  . Anxiety   . Depression   . Hypertension   . CHF (congestive heart failure) ([place])   . Myocardial infarction [facility]) 1980's- medical intervention    "so mild I didn't know I'd had it"  . Sleep apnea     non-compliant cpap  . DJD (degenerative joint  disease) hips and knees    s/p bilateral total replacements  . DDD (degenerative disc disease)     chronic back pain  . Arthritis     "I'm covered up w/it"  . Chronic lower back pain   . Incomplete bladder emptying     Past Surgical History  Procedure Laterality Date  . Cholecystectomy    . Total hip arthroplasty Right 03-25-08--  . Total hip arthroplasty Left 2005  . Total knee arthroplasty Right 2004  . Total knee arthroplasty Left 1997  . Inguinal hernia repair Bilateral   . Cataract extraction w/ intraocular lens implant, bilateral Bilateral ~ 2000  . Appendectomy    . Transurethral resection of prostate  "years ago"  . Penile prosthesis implant  1990's  . Shoulder open rotator cuff repair Left   . Inner ear surgery Right yrs ago    "trying to get my hearing back  . Cardiac catheterization  2007    noncritical cad (results w/ chart)  . Coronary angioplasty with stent placement  08-03-08    drug-eluting stent x2 distal and mid lad  . Transurethral resection of prostate  01/08/2011    Procedure: TRANSURETHRAL RESECTION OF THE PROSTATE (TURP); Surgeon: Franchot Gallo; Location: [place]; Service: Urology; Laterality: N/A; GYRUS   . Prostate biopsy  11/30/13    Gleason 8, vol 22.14 cc  . Gamma knife radiation  2000    [facility] for meningioma, last eval 2013- no change  . Left heart catheterization with coronary angiogram N/A 09/04/2013    Procedure: LEFT HEART CATHETERIZATION WITH CORONARY ANGIOGRAM; Surgeon: Jettie Booze, MD; Location: [facility] CATH LAB; Service: Cardiovascular; Laterality: N/A;  . Percutaneous coronary rotoblator intervention (pci-r) N/A 09/08/2013    Procedure: PERCUTANEOUS CORONARY ROTOBLATOR INTERVENTION (PCI-R); Surgeon: Jettie Booze, MD; Location: [facility] LAB; Service: Cardiovascular;  Laterality: N/A;  . Revision total knee arthroplasty Right   . Total hip revision Right 3-4 times  . Closed reduction hip dislocation Right "several"  . Esophagogastroduodenoscopy N/A 02/11/2014    Procedure: ESOPHAGOGASTRODUODENOSCOPY (EGD); Surgeon: Cleotis Nipper, MD; Location: [facility] ENDOSCOPY; Service: Endoscopy; Laterality: N/A;     Medications: Current Outpatient Prescriptions  Medication Sig Dispense Refill  . amiodarone (PACERONE) 100 MG tablet Take 1 tablet (100 mg total) by mouth daily. 30 tablet 6  . amoxicillin (AMOXIL) 500 MG capsule TK 4 CS PO 1 HOUR PRIOR TO APPOINTMENT  0  . bicalutamide (CASODEX) 50 MG tablet Take 50 mg by mouth daily.   3  . clopidogrel (PLAVIX) 75 MG tablet Take 1 tablet (75 mg total) by mouth daily. 30 tablet 5  . CREON 36000 UNITS CPEP capsule TK 1 TO 3 CS PO WITH EACH MEAL PRN  3  . dorzolamide-timolol (COSOPT) 22.3-6.8 MG/ML ophthalmic solution Place 1 drop into both eyes 2 (two) times daily.   2  . doxazosin (CARDURA) 4 MG tablet Take 4 mg by mouth daily.  0  . finasteride (PROSCAR) 5 MG tablet Take 5 mg by mouth daily.    . fluticasone (FLONASE) 50 MCG/ACT nasal spray Place 2 sprays into both nostrils as needed for allergies.   1  . furosemide (LASIX) 40 MG tablet TAKE 1&1/2 TABLETS BY MOUTH DAILY FOR 3 DAYS THEN 1 TABLET DAILY 90 tablet 3  . LORazepam (ATIVAN) 0.5 MG tablet Take 0.5 mg by mouth every 8 (eight) hours.    [name] losartan (COZAAR) 25 MG tablet Take 0.5 tablets (12.5 mg total) by mouth daily. 30 tablet 3  . Multiple Vitamin (MULTIVITAMIN) tablet Take 1 tablet by mouth daily.     . nitroGLYCERIN (NITROSTAT) 0.4 MG SL tablet Place 1 tablet (0.4 mg total) under the tongue every 5 (five) minutes as needed for chest pain. 20 tablet 0  . OVER THE COUNTER MEDICATION Take 1 tablet  by mouth 2 (two) times daily. Vitamins for eyes    . OVER THE  COUNTER MEDICATION Take 2 tablets by mouth daily. [facility] brand for prostate health    . Pancrelipase, Lip-Prot-Amyl, (CREON PO) Take 1 tablet by mouth daily.     . pantoprazole (PROTONIX) 40 MG tablet TK 1 T PO D  5  . Probiotic Product (PROBIOTIC DAILY PO) Take 1 capsule by mouth daily.    . psyllium (METAMUCIL) 58.6 % packet Take 1 packet by mouth daily as needed (for constipation).    . ranitidine (ZANTAC) 150 MG tablet Take 150-300 mg by mouth 2 (two) times daily as needed for heartburn.     . traMADol (ULTRAM) 50 MG tablet Take 50 mg by mouth 3 (three) times daily.     No current facility-administered medications for this visit.    Allergies: Allergies  Allergen Reactions  . Codeine Other (See Comments) and Anxiety    Insomnia/ hyper  . Prilosec [Omeprazole] Other (See Comments) and Nausea And Vomiting    GI upset, insomnia GI upset, insomnia  . Warfarin Sodium Other (See Comments) and Anxiety    Hx gi bleed Hx gi bleed    Social History: The patient  reports that he quit smoking about 56 years ago. His smoking use included Cigarettes. He has a 14 pack-year smoking history. He has never used smokeless tobacco. He reports that he drinks alcohol. He reports that he does not use illicit drugs.  Family History: The patient's family history includes Cancer in his brother and another family member; Emphysema in his father; Heart attack in his mother; Heart disease in his mother.   Review of Systems: Please see the history of present illness. Otherwise, the review of systems is positive for none. All other systems are reviewed and negative.   Physical Exam: VS: BP 118/76 mmHg  Pulse 89  Ht 5\' 8"  (1.727 m)  Wt 190 lb 6.4 oz (86.365 kg)  BMI 28.96 kg/m2  SpO2 91% . BMI Body mass index is 28.96 kg/(m^2).  Wt Readings from Last 3 Encounters:  01/03/15 190 lb 6.4 oz (86.365 kg)  01/01/15 185 lb (83.915  kg)  12/28/14 194 lb 6.4 oz (88.179 kg)    General: Elderly male. Looks chronically ill. Color is sallow. He is in no acute distress.  HEENT: Normal.  Neck: Supple, no JVD, carotid bruits, or masses noted.  Cardiac: Irregular irregular rhythm. Rate is ok. Heart tones are distant today. No edema.  Respiratory: Lungs are clear to auscultation bilaterally with normal work of breathing.  GI: Soft and nontender. Not as distended as last week.  MS: No deformity or atrophy. Gait unsteady.  Skin: Warm and dry. Color is sallow. Neuro: Strength and sensation are intact and no gross focal deficits noted.  Psych: Alert, appropriate and with normal affect.   LABORATORY DATA:  EKG: EKG is not ordered today.   Recent Labs    Lab Results  Component Value Date   WBC 7.1 01/01/2015   HGB 14.6 01/01/2015   HCT 41.9 01/01/2015   PLT 146* 01/01/2015   GLUCOSE 122* 01/01/2015   ALT 14 12/28/2014   AST 18 12/28/2014   NA 136 01/01/2015   K 3.7 01/01/2015   CL 97* 01/01/2015   CREATININE 1.42* 01/01/2015   BUN 22* 01/01/2015   CO2 28 01/01/2015   TSH 1.901 12/28/2014   INR 1.17 02/10/2014   HGBA1C 5.4 02/10/2014      BNP (last 3 results)  Recent Labs (within last 365 days)     Recent Labs  01/01/15 1206  BNP 101.4*      ProBNP (last 3 results)  Recent Labs (within last 365 days)     Recent Labs  11/30/14 1604  PROBNP 150.0*       Other Studies Reviewed Today:  Myoview Study Highlights from 06/2014    Intermediate stress nuclear study with chest tightness; ECG uninterpretable due to baseline LBBB; large, severe, fixed inferoseptal and apical defect consistent with prior infarct vs LBBB; no ischemia; moderate global reduction in LV function and LVE; study intermediate risk due to reduced LV function.    Echo Study Conclusions from 11/2014  - Left ventricle: Diffuse  hypokinesis abnormal septal motion The cavity size was moderately dilated. Wall thickness was increased in a pattern of moderate LVH. Systolic function was moderately to severely reduced. The estimated ejection fraction was in the range of 30% to 35%. Doppler parameters are consistent with abnormal left ventricular relaxation (grade 1 diastolic dysfunction). - Mitral valve: Anteriorly directed MR with restricted posterior leaflet There was mild to moderate regurgitation. - Left atrium: The atrium was mildly dilated. - Atrial septum: No defect or patent foramen ovale was identified.   Echo Study Conclusions from 09/2013 - Left ventricle: The cavity size was normal. Wall thickness was increased in a pattern of moderate LVH. Systolic function was normal. The estimated ejection fraction was in the range of 50% to 55%. There was an increased relative contribution of atrial contraction to ventricular filling. Doppler parameters are consistent with abnormal left ventricular relaxation (grade 1 diastolic dysfunction). - Mitral valve: There was mild regurgitation. - Right atrium: The atrium was mildly dilated.  Assessment/Plan:  1. PAF - uncertain duration. Discussed with Dr. Lovena [name] last week - not felt to be a good candidate for anticoagulation given history of recurrent GI bleed. HR seems controlled but needs to be monitored for recurrent bradycardia. Would not want to increase his amiodarone due to NOT being anticoagulated. Unfortunately, stroke risk remains high. We have no real good options here and this has been explained to the patient several times. He wishes for "everything possible". Lots of active symptoms today - admitting to [facility] for further disposition. Discussed with Dr. Mare Ferrari - will hold the Plavix and place on Lovenox on admission and hold aspirin therapy.   2. History of bradycardia - rate is currently ok on no medicines. Pacemaker not warranted at  this time but will monitor on telemetry and consult Dr. Lovena [name] or EP as warranted. Dr. Lovena [name] also felt like [facility] was not an option as well.   3. CAD - no active chest pain - he is over one year out from his PCI. He remains on his Plavix this will be held going forward.    4. LV dysfunction - noted by Myoview earlier this year - most recent echo last week with EF of 30 to 35%. Most likely exacerbated by his AF. Multitude of symptoms - he feels poorly - looks sallow. Will admit to telemetry. Check labs. Try to manage medically but has limited options.   5. Advanced age - now with generalized failure to thrive. Admitting for further management. Discussed with Dr. Mare Ferrari and he is in agreement with this plan of care. He will be transferred by EMS.       Current medicines are reviewed with the patient today. The patient does not have concerns regarding medicines other than what has been noted above.  The  following changes have been made: See above.  Labs/ tests ordered today include:   No orders of the defined types were placed in this encounter.    Disposition: FU    Patient is agreeable to this plan and will call if any problems develop in the interim.   Signed: Burtis Junes, RN, ANP-C 01/03/2015 9:50 AM  [facility] [address] [place], [place] [address] Phone: [phone] Fax: [phone]                   Referring Provider     Nathan Hatchet, MD     Diagnoses     Chronic systolic heart failure [facility]) - Primary    ICD-9-CM: 428.22 ICD-10-CM: I50.22    PAF (paroxysmal atrial fibrillation) ([place])     ICD-9-CM: 427.31 ICD-10-CM: I48.0       Reason for Visit     Atrial Fibrillation    Follow up visit - seen for Dr. Irish [name] and Dr. Lovena [name]    Reason for Visit History        Discontinued Medications       Reason for Discontinue    silodosin (RAPAFLO) 8 MG  CAPS capsule Change in therapy      Medication Notes     amoxicillin (AMOXIL) 500 MG capsule     [name] 01/03/2015 9:27 AM Received from: [facility] capsule     [name] 01/03/2015 9:27 AM Received from: External Pharmacy      pantoprazole (PROTONIX) 40 MG tablet     [name] 01/03/2015 9:27 AM Received from: External Pharmacy        Level of Service     PR OFFICE OUTPATIENT VISIT 25 MINUTES [99214]      Follow-up and Disposition     Routing History       All Charges for This Encounter     Code Description Service Date Service Provider Modifiers Qty    [phone] PR OFFICE OUTPATIENT VISIT 25 MINUTES 01/03/2015 Nathan Junes, NP  1    [phone] PR ASP THERP USED 01/03/2015 Nathan Junes, NP  1    [phone] PR CURRENT TOBACCO NON-USER 01/03/2015 Nathan Junes, NP  1      AVS Reports     No AVS Snapshots are available for this encounter.     Routing History     There are no sent or routed communications associated with this encounter.     Previous Visit       Provider Department Encounter #    01/01/2015 11:45 AM Truitt Merle, NP [facility] [id number]

## 2015-01-04 ENCOUNTER — Encounter (HOSPITAL_COMMUNITY): Payer: Self-pay | Admitting: *Deleted

## 2015-01-04 ENCOUNTER — Inpatient Hospital Stay (HOSPITAL_COMMUNITY): Payer: Medicare Other

## 2015-01-04 DIAGNOSIS — I2510 Atherosclerotic heart disease of native coronary artery without angina pectoris: Secondary | ICD-10-CM

## 2015-01-04 DIAGNOSIS — I480 Paroxysmal atrial fibrillation: Secondary | ICD-10-CM

## 2015-01-04 DIAGNOSIS — R5383 Other fatigue: Secondary | ICD-10-CM

## 2015-01-04 DIAGNOSIS — Z9861 Coronary angioplasty status: Secondary | ICD-10-CM

## 2015-01-04 DIAGNOSIS — R001 Bradycardia, unspecified: Secondary | ICD-10-CM

## 2015-01-04 LAB — BASIC METABOLIC PANEL
Anion gap: 13 (ref 5–15)
BUN: 21 mg/dL — ABNORMAL HIGH (ref 6–20)
CO2: 24 mmol/L (ref 22–32)
Calcium: 9.2 mg/dL (ref 8.9–10.3)
Chloride: 97 mmol/L — ABNORMAL LOW (ref 101–111)
Creatinine, Ser: 1.44 mg/dL — ABNORMAL HIGH (ref 0.61–1.24)
GFR calc Af Amer: 49 mL/min — ABNORMAL LOW (ref >60)
GFR calc non Af Amer: 42 mL/min — ABNORMAL LOW (ref >60)
Glucose, Bld: 102 mg/dL — ABNORMAL HIGH (ref 65–99)
Potassium: 3.3 mmol/L — ABNORMAL LOW (ref 3.5–5.1)
Sodium: 134 mmol/L — ABNORMAL LOW (ref 135–145)

## 2015-01-04 MED ORDER — FUROSEMIDE 20 MG PO TABS
20.0000 mg | ORAL_TABLET | Freq: Every day | ORAL | Status: DC
  Administered 2015-01-05: 20 mg via ORAL
  Filled 2015-01-04: qty 1

## 2015-01-04 NOTE — Progress Notes (Signed)
Patient Profile: 81 y.o. male with h/o PAF not on anticoagulation due to h/o recurrent GIB, CAD and mitral regurgitation. He underwet complex PCI of his LAD in July 2015 involving rotational atherectomy. Prior to the intervention, his ejection fraction was in the 30-35% range. Echocardiogram about a month after the intervention showed an improvement in his EF up to 50-55%. However recent 2D echo 12/28/14 shows drop in EF back down to 30-35% + G1DD. He also has a h/o symptomatic bradycardia which improved in the past after discontinuation of AVN blocking agents. He was seen by Dr. Lovena Le in the past who felt that PPM was not indicated as his bradycardia resolved with discontinuation of BB.  He was admitted from the office 01/03/15 for symptoms of fatigue, weakness and near syncope in the setting of recurrent atrial fibrillation with a CVR. He was admitted to telemetry as there was also concerns for possible recurrent bradycardia.   Subjective: Still feels weak but a bit better today. He does note a 4 month h/o occasional indigestion ~15 min after meals but no other CP.   Objective: Vital signs in last 24 hours: Temp:  [97 F (36.1 C)-98.1 F (36.7 C)] 97.7 F (36.5 C) (11/08 1308) Pulse Rate:  [43-60] 54 (11/08 1308) Resp:  [15-20] 15 (11/08 1308) BP: (97-148)/(49-70) 114/60 mmHg (11/08 1308) SpO2:  [96 %-100 %] 100 % (11/08 1308) Weight:  [183 lb 8 oz (83.235 kg)] 183 lb 8 oz (83.235 kg) (11/08 0520) Last BM Date: 01/03/15  Intake/Output from previous day: 11/07 0701 - 11/08 0700 In: 840 [P.O.:840] Out: 1875 [Urine:1875] Intake/Output this shift: Total I/O In: 535 [P.O.:535] Out: 450 [Urine:450]  Medications Current Facility-Administered Medications  Medication Dose Route Frequency Provider Last Rate Last Dose  . 0.9 %  sodium chloride infusion  250 mL Intravenous PRN Burtis Junes, NP      . acetaminophen (TYLENOL) tablet 650 mg  650 mg Oral Q4H PRN Burtis Junes, NP        . amiodarone (PACERONE) tablet 100 mg  100 mg Oral Daily Burtis Junes, NP   100 mg at 01/04/15 0924  . bicalutamide (CASODEX) tablet 50 mg  50 mg Oral Daily Burtis Junes, NP   50 mg at 01/04/15 0925  . dorzolamide-timolol (COSOPT) 22.3-6.8 MG/ML ophthalmic solution 1 drop  1 drop Both Eyes BID Burtis Junes, NP   1 drop at 01/04/15 0927  . doxazosin (CARDURA) tablet 4 mg  4 mg Oral Daily Burtis Junes, NP   4 mg at 01/04/15 0926  . famotidine (PEPCID) tablet 20 mg  20 mg Oral Daily Burtis Junes, NP   20 mg at 01/04/15 0926  . finasteride (PROSCAR) tablet 5 mg  5 mg Oral Daily Burtis Junes, NP   5 mg at 01/04/15 0925  . fluticasone (FLONASE) 50 MCG/ACT nasal spray 2 spray  2 spray Each Nare Daily Burtis Junes, NP   2 spray at 01/04/15 1038  . furosemide (LASIX) injection 20 mg  20 mg Intravenous Q12H Jettie Booze, MD   20 mg at 01/04/15 0925  . losartan (COZAAR) tablet 12.5 mg  12.5 mg Oral Daily Burtis Junes, NP   12.5 mg at 01/04/15 0925  . ondansetron (ZOFRAN) injection 4 mg  4 mg Intravenous Q6H PRN Burtis Junes, NP      . polyethylene glycol (MIRALAX / GLYCOLAX) packet 17 g  17 g Oral Daily Burtis Junes,  NP   17 g at 01/04/15 0927  . sodium chloride 0.9 % injection 3 mL  3 mL Intravenous Q12H Burtis Junes, NP   3 mL at 01/04/15 0928  . sodium chloride 0.9 % injection 3 mL  3 mL Intravenous PRN Burtis Junes, NP      . tamsulosin (FLOMAX) capsule 0.4 mg  0.4 mg Oral QPC breakfast Burtis Junes, NP   0.4 mg at 01/04/15 4196    PE: General appearance: alert, cooperative and no distress Neck: no carotid bruit and no JVD Lungs: clear to auscultation bilaterally Heart: regular rate and rhythm, S1, S2 normal, no murmur, click, rub or gallop Extremities: no LEE Pulses: 2+ and symmetric Skin: warm and dry Neurologic: Grossly normal  Lab Results:   Recent Labs  01/03/15 1145  WBC 7.4  HGB 13.8  HCT 39.6  PLT 145*   BMET  Recent Labs   01/03/15 1145 01/04/15 0328  NA 131* 134*  K 3.7 3.3*  CL 94* 97*  CO2 28 24  GLUCOSE 121* 102*  BUN 20 21*  CREATININE 1.46* 1.44*  CALCIUM 9.3 9.2   PT/INR  Recent Labs  01/03/15 1145  LABPROT 14.6  INR 1.12      Assessment/Plan  Active Problems:   Systolic heart failure (HCC)   1. Fatigue/ Weakness/ Near Syncope: likely multifactorial with  bradycardia, afib and CHF. Telemetry shows bradycardia with a rate in the 50s. TSH normal. Hgb normal at 13.8. Given drop in EF and new WMA, will need to r/o ischemia. Will plan on NST in the am.     2. Atrial Fibrillation w/ a CVR: TSH, and Mg  both normal. K low today at 3.3. Supplement K. Continue on low dose amiodarone, 100 mg daily. Not an anticoagulation candidate due to recurrent GIB.   3. Chronic Systolic CHF: EF is 22-29%. BNP was only 101 on admit. He has put out ~2 L since admit with IV lasix. No dyspnea. euvolemic on physical exam. Will switch to PO lasix. Low sodium diet. Continue ARB. No BB given bradycardia.   4. CAD: s/p PCI of the proximal LAD 08/2013. Patent RCA and left circumflex at time of cath in 2015. Recent 2D echo 12/28/14 shows drop in EF down from 50-55% to 30-35%. Also with new diffuse hypokinesis with abnormal septal motion. With unexplained drop in EF and h/o LAD disease, will plan for NST in the am to r/o ischemia  5. Bradycardia: HR currently in the 50s. He is on low dose amiodarone for PAF but no other AVN blocking agents. Per Dr. Lovena Le, EP will hold off on inpatient device implantation unless he develops long pauses. If he remains stable, plan will be to follow-up with Dr. Lovena Le in [place] [facility] in 1-2 weeks to reassess and to re-discuss PPM at that time.     LOS: 1 day    Brittainy M. Rosita Fire, PA-C 01/04/2015 3:29 PM  I have seen, examined and evaluated the patient this PM along with Ms. Rosita Fire, [place].  After reviewing all the available data and chart,  I agree with her findings, examination as well as  impression recommendations.  Difficult situation as described.  Not sure of the whole reason for admission, but is concerned about epigastric discomfort, fatigue & exertional dyspnea.  Recently reduced LVEF.    Rate seems better with Afib - less bradycardic. NO Anticoagulation for reasons described in H&P.    With reduced LVEF - cannot exclude ischemic etiology (  especially with septal hypoKinesis).  Lexiscan Myoview in AM. Good diuresis with IV lasix - BNP not very elevated & no rales - convert to PO @ increased dose from OP dose.  EP still considering BiPPM - but will determine as OP.    Leonie Man, M.D., M.S. Interventional Cardiologist   Pager # [phone]

## 2015-01-04 NOTE — Progress Notes (Signed)
Pt had asked for tramadol 50mg  PO TiD, as he is taking this at home.  Spoke to [employer], and she stated that we would give pt Tylenol 650mg  PO as needed for any chronic pain that he is having.  Pt does have a hx of GI bleed.

## 2015-01-04 NOTE — Progress Notes (Signed)
Notified PA-C that pt is requesting order for scheduled Tramadol 50mg  TiD, as he takes this at home.

## 2015-01-04 NOTE — Consult Note (Signed)
ELECTROPHYSIOLOGY CONSULT NOTE    Patient ID: [name] MRN: [id number], DOB/AGE: 1926/10/15 81 y.o.  Admit date: 01/03/2015 Date of Consult: 01/04/2015 Primary Physician: Velna Hatchet, MD Primary Cardiologist: Dr. Irish Lack  Reason for Consultation: Bradycardia  HPI: [name] is a 81 y.o. male with a PMHx of CAD, ischmenic CM that had improved last year, HTN, bradycardia, PAFib not on anticoagulation with history of recurrent GIB, dyslipidemia was sent to [facility] via EMS yesterday from the office with what was felt possibly symptomatic bradycardia.  The patient reports that he feels his anxiety and associated fairly peristent GI upset have a role in his he is feeling, sleeping poorly with insomina as well.  He states some degree of chronic fatigue over the years, though in the last few weeks fairly acutely worse, with dizzy spells, a fall that he denies full syncope, but was to weak to get himself off the floor, this event prompted a ER visit 01/01/15 that he was eventually d/c to home without any particular findings outside of some mild dehydration.  His symptoms continued though no further falls.  He has been seen out patient a number of times, and yesterday referred via EMS to the hospital with ongoing symptoms of dizziness/weakness and bradycardia.  He had a complex PCI of his LAD in July 2015 involving rotational atherectomy and DES. Prior to the intervention, his ejection fraction was in the 30-35% range. If his EF had remained depressed [name] was going to place a BiV pacemaker. Echocardiogram about a month after the intervention showed an improvement in his EF from 50-55%, without any clear symptoms of bradycardia and he has been observed outpatient.  Since then his EF has againi been found in the 30-35% range, most recently this month, he had a myoview in may 2015 without ischemia, EF 30-35%  He denies any active symptoms at this time, he denies CP, has 3 negative  Troponins.  Denies any rest symptoms, no SOB or dizziness this morning.  He does feel like he gets winded pretty easily with ambulation.   Past Medical History  Diagnosis Date  . PAF (paroxysmal atrial fibrillation) (HCC)     controlled w/ amiodarone; not on coumadin due to hx of GI bleed  . Mitral regurgitation   . GERD (gastroesophageal reflux disease)     occ. take prevacid  . Meningioma ([place]) right -sided w/ right VI palsy    followed by dr Gaynell Face  . Diverticulitis of colon with bleeding     s/p sigmoid resection '88  . History of GI diverticular bleed april 2012    transfused blood and resolved without surgical intervention  . Impotence, organic     s/p penile prosthesis 1990's  . BPH (benign prostatic hypertrophy) with urinary obstruction     s/p turp yrs ago  . Blood transfusion     "related to a surgery"  . Impaired hearing bilateral     only left hearing aid  . Bradycardia     Amio d/c'd 08/2013; brady arrest 08/2013 after PCI >>> recurrent AF >>> Amiodarone restarted (may need BiV pacer if EF does not recover; consider Holter if + sx's)  . Cardiomyopathy ([place])     a. Echo (08/2013):  EF 30-35%, AS hypokinesis, Gr 1 diast dysfn, mild MR, mild LAE >>> b. improved EF 50-55% by echo 8/15  . CAD (coronary artery disease)     a. s/p MI and prior PCI of LAD;  b. LHC (08/2013):  prox LAD 60-70%, mid LAD stents ok, ostial lesion at Dx jailed by stent, mild CFX and RCA disesase >>>  PCI (09/08/13):  rotational atherectomy + Promus DES to prox LAD  . Mitral valve disorders   . Hx of echocardiogram     Echo (8/15):  Mod LVH, EF 50-55%, Gr 1 DD, mild MR, mild RAE  . Prostate cancer ([place]) 11/30/13    Gleason 8, volume 22.14 cc  . Atrial fibrillation ([place])   . Heart murmur     with mitral valve regurgitation  . Anxiety   . Depression   . Hypertension   . CHF (congestive heart failure) ([place])   . Myocardial infarction [facility]) 1980's- medical intervention    "so mild I didn't know I'd  had it"  . Sleep apnea     non-compliant cpap  . DJD (degenerative joint disease) hips and knees    s/p bilateral total replacements  . DDD (degenerative disc disease)     chronic back pain  . Arthritis     "I'm covered up w/it"  . Chronic lower back pain   . Incomplete bladder emptying      Surgical History:  Past Surgical History  Procedure Laterality Date  . Cholecystectomy    . Total hip arthroplasty Right 03-25-08--  . Total hip arthroplasty Left 2005  . Total knee arthroplasty Right 2004  . Total knee arthroplasty Left 1997  . Inguinal hernia repair Bilateral   . Cataract extraction w/ intraocular lens  implant, bilateral Bilateral ~ 2000  . Appendectomy    . Transurethral resection of prostate  "years ago"  . Penile prosthesis implant  1990's  . Shoulder open rotator cuff repair Left   . Inner ear surgery Right yrs ago    "trying to get my hearing back  . Cardiac catheterization  2007    noncritical cad (results w/ chart)  . Coronary angioplasty with stent placement  08-03-08    drug-eluting stent x2 distal and mid lad  . Transurethral resection of prostate  01/08/2011    Procedure: TRANSURETHRAL RESECTION OF THE PROSTATE (TURP);  Surgeon: Franchot Gallo;  Location: [place];  Service: Urology;  Laterality: N/A;  GYRUS   . Prostate biopsy  11/30/13    Gleason 8, vol 22.14 cc  . Gamma knife radiation  2000    [facility] for meningioma, last eval 2013- no change  . Left heart catheterization with coronary angiogram N/A 09/04/2013    Procedure: LEFT HEART CATHETERIZATION WITH CORONARY ANGIOGRAM;  Surgeon: Jettie Booze, MD;  Location: [facility] CATH LAB;  Service: Cardiovascular;  Laterality: N/A;  . Percutaneous coronary rotoblator intervention (pci-r) N/A 09/08/2013    Procedure: PERCUTANEOUS CORONARY ROTOBLATOR INTERVENTION (PCI-R);  Surgeon: Jettie Booze, MD;  Location[facility] LAB;  Service: Cardiovascular;  Laterality: N/A;  . Revision total  knee arthroplasty Right   . Total hip revision Right 3-4 times  . Closed reduction hip dislocation Right "several"  . Esophagogastroduodenoscopy N/A 02/11/2014    Procedure: ESOPHAGOGASTRODUODENOSCOPY (EGD);  Surgeon: Cleotis Nipper, MD;  Location: [facility] ENDOSCOPY;  Service: Endoscopy;  Laterality: N/A;     Prescriptions prior to admission  Medication Sig Dispense Refill Last Dose  . amiodarone (PACERONE) 100 MG tablet Take 1 tablet (100 mg total) by mouth daily. 30 tablet 6 01/03/2015 at Unknown time  . amoxicillin (AMOXIL) 500 MG capsule Take 2,000 mg by mouth See admin instructions. Take 4 capsules (2000 mg) by mouth one hour prior to  dental appointments   12/16/2014  . bicalutamide (CASODEX) 50 MG tablet Take 50 mg by mouth daily.   3 01/03/2015 at Unknown time  . clopidogrel (PLAVIX) 75 MG tablet Take 1 tablet (75 mg total) by mouth daily. 30 tablet 5 01/03/2015 at Unknown time  . docusate sodium (COLACE) 100 MG capsule Take 100 mg by mouth daily.   01/03/2015 at Unknown time  . dorzolamide-timolol (COSOPT) 22.3-6.8 MG/ML ophthalmic solution Place 1 drop into both eyes 2 (two) times daily.   2 01/03/2015 at 630  . doxazosin (CARDURA) 4 MG tablet Take 4 mg by mouth at bedtime.   0 01/02/2015 at Unknown time  . finasteride (PROSCAR) 5 MG tablet Take 5 mg by mouth daily.   01/03/2015 at Unknown time  . fluticasone (FLONASE) 50 MCG/ACT nasal spray Place 2 sprays into both nostrils as needed for allergies.   1 few days ago  . furosemide (LASIX) 40 MG tablet TAKE 1&1/2 TABLETS BY MOUTH DAILY FOR 3 DAYS THEN 1 TABLET DAILY (Patient taking differently: TAKE 1 TABLET BY MOUTH DAILY) 90 tablet 3 01/02/2015 at Unknown time  . lipase/protease/amylase (CREON) 36000 UNITS CPEP capsule Take 36,000 Units by mouth 3 (three) times daily before meals.   unknown at Unknown time  . LORazepam (ATIVAN) 0.5 MG tablet Take 0.5 mg by mouth every 8 (eight) hours as needed for anxiety.    several weeks ago  . losartan (COZAAR)  25 MG tablet Take 0.5 tablets (12.5 mg total) by mouth daily. 30 tablet 3 01/03/2015 at Unknown time  . Multiple Vitamin (MULTIVITAMIN WITH MINERALS) TABS tablet Take 1 tablet by mouth daily. Centrum Silver   01/03/2015 at Unknown time  . nitroGLYCERIN (NITROSTAT) 0.4 MG SL tablet Place 1 tablet (0.4 mg total) under the tongue every 5 (five) minutes as needed for chest pain. 20 tablet 0 unknown  . Omega 3-Lutein-Zeaxanthin (ADVANCED EYE HEALTH PO) Take 1 tablet by mouth at bedtime.   01/02/2015 at Unknown time  . OVER THE COUNTER MEDICATION Take 2 capsules by mouth 2 (two) times daily. Omega XL   01/03/2015 at am  . Probiotic Product (PROBIOTIC PO) Take 2 tablets by mouth daily.   01/03/2015 at Unknown time  . psyllium (METAMUCIL) 58.6 % packet Take 1 packet by mouth daily.    01/02/2015 at Unknown time  . traMADol (ULTRAM) 50 MG tablet Take 50 mg by mouth 3 (three) times daily.   01/03/2015 at am    Inpatient Medications:  . amiodarone  100 mg Oral Daily  . bicalutamide  50 mg Oral Daily  . dorzolamide-timolol  1 drop Both Eyes BID  . doxazosin  4 mg Oral Daily  . famotidine  20 mg Oral Daily  . finasteride  5 mg Oral Daily  . fluticasone  2 spray Each Nare Daily  . furosemide  20 mg Intravenous Q12H  . losartan  12.5 mg Oral Daily  . polyethylene glycol  17 g Oral Daily  . sodium chloride  3 mL Intravenous Q12H  . tamsulosin  0.4 mg Oral QPC breakfast    Allergies:  Allergies  Allergen Reactions  . Protonix [Pantoprazole Sodium] Other (See Comments)    Gi upset, insomnia  . Codeine Anxiety and Other (See Comments)    Insomnia/ hyper  . Prilosec [Omeprazole] Nausea And Vomiting and Other (See Comments)    GI upset, insomnia   . Warfarin Sodium Anxiety and Other (See Comments)    Hx gi bleed  Social History   Social History  . Marital Status: Widowed    Spouse Name: N/A  . Number of Children: N/A  . Years of Education: N/A   Occupational History  . Not on file.   Social  History Main Topics  . Smoking status: Former Smoker -- 1.00 packs/day for 14 years    Types: Cigarettes    Quit date: 01/05/1959  . Smokeless tobacco: Never Used  . Alcohol Use: 0.0 oz/week     Comment: 02/09/2014 "I'm not drinking anymore"  . Drug Use: No  . Sexual Activity: No   Other Topics Concern  . Not on file   Social History Narrative     Family History  Problem Relation Age of Onset  . Heart disease Mother   . Heart attack Mother   . Cancer    . Emphysema Father   . Cancer Brother     liver cancer     Review of Systems: All other systems reviewed and are otherwise negative except as noted above.  Physical Exam: Filed Vitals:   01/03/15 2038 01/04/15 0118 01/04/15 0520 01/04/15 0819  BP: 142/70 97/49 148/67 119/62  Pulse: 60 52 43 59  Temp: 97.6 F (36.4 C) 97.7 F (36.5 C) 97 F (36.1 C) 97.3 F (36.3 C)  TempSrc: Oral Oral Oral Oral  Resp: 18 20 20 18   Height:      Weight:   183 lb 8 oz (83.235 kg)   SpO2: 98% 96% 98% 97%    GEN- The patient is well appearing, alert and oriented x 3 today.   HEENT: normocephalic, atraumatic; sclera clear, conjunctiva pink; hearing intact; oropharynx clear; neck supple, no JVP Lymph- no cervical lymphadenopathy Lungs- Clear to ausculation bilaterally, normal work of breathing.  No wheezes, rales, rhonchi Heart- Regular rate and rhythm, 1/6 systolic murmur, no rubs or gallops, PMI not laterally displaced GI- soft, non-tender, non-distended, bowel sounds present Extremities- no clubbing, cyanosis, or edema; wearing support stockings MS- no significant deformity or atrophy Skin- warm and dry, no rash or lesion Psych- euthymic mood, full affect Neuro- no gross deficits observed  Labs:   Lab Results  Component Value Date   WBC 7.4 01/03/2015   HGB 13.8 01/03/2015   HCT 39.6 01/03/2015   MCV 91.9 01/03/2015   PLT 145* 01/03/2015    Recent Labs Lab 01/03/15 1145 01/04/15 0328  NA 131* 134*  K 3.7 3.3*  CL  94* 97*  CO2 28 24  BUN 20 21*  CREATININE 1.46* 1.44*  CALCIUM 9.3 9.2  PROT 6.1*  --   BILITOT 1.1  --   ALKPHOS 65  --   ALT 18  --   AST 23  --   GLUCOSE 121* 102*      Radiology/Studies:  Dg Chest 2 View 01/04/2015  CLINICAL DATA:  Shortness of Breath EXAM: CHEST  2 VIEW COMPARISON:  January 01, 2015 FINDINGS: There is slight scarring in the bases. No edema or consolidation. Heart size and pulmonary vascularity are normal. No adenopathy. No pneumothorax. There is extensive atherosclerotic calcification in the aorta. No bone lesions. There is calcification in the right carotid artery as well as coronary artery calcification. IMPRESSION: Slight bibasilar lung scarring. No edema or consolidation. Extensive atherosclerotic calcification in aorta. There is also calcification in the right carotid artery. There are foci of coronary artery calcification as well. Electronically Signed   By: Lowella Grip III M.D.   On: 01/04/2015 07:29   Dg Chest  2 View 01/01/2015  CLINICAL DATA:  81 year old male with chronic productive cough. Central chest pain for the past 3 days. EXAM: CHEST  2 VIEW COMPARISON:  Chest x-ray 12/14/2014. FINDINGS: Lung volumes are normal. No consolidative airspace disease. No pleural effusions. No pneumothorax. No pulmonary nodule or mass noted. Pulmonary vasculature and the cardiomediastinal silhouette are within normal limits. Atherosclerosis in the thoracic aorta. IMPRESSION: 1.  No radiographic evidence of acute cardiopulmonary disease. 2. Atherosclerosis. Electronically Signed   By: Vinnie Langton M.D.   On: 01/01/2015 12:08    EKG: SB, 53bpm, LBBB, LAD TELEMETRY: SB 40's-50's, 1st degree AV block  Myoview Study Highlights from 06/2014    Intermediate stress nuclear study with chest tightness; ECG uninterpretable due to baseline LBBB; large, severe, fixed inferoseptal and apical defect consistent with prior infarct vs LBBB; no ischemia; moderate global  reduction in LV function and LVE; study intermediate risk due to reduced LV function.                    12/28/14: Echocardiogram (limited) Study Conclusions - Left ventricle: Diffuse hypokinesis abnormal septal motion The cavity size was moderately dilated. Wall thickness was increased in a pattern of moderate LVH. Systolic function was moderately to severely reduced. The estimated ejection fraction was in the range of 30% to 35%. Doppler parameters are consistent with abnormal left ventricular relaxation (grade 1 diastolic dysfunction). - Mitral valve: Anteriorly directed MR with restricted posterior leaflet There was mild to moderate regurgitation. - Left atrium: The atrium was mildly dilated. - Atrial septum: No defect or patent foramen ovale was identified.  Assessment and Plan:  1. Bradycardia     It has been unclear over the last year wether he has had any symptomatic bradycardia and device implant has been deferred     He is on Amiodarone 100mg  daily, this morning is in a sinus rhythm, recently he has been in AF     At his time, no evidence of urgent need for PPM implantation with HR in the 40's-50's while awake     We would prefer that he be tuned up with general cardiology, allowed to go home and plan for BiVe Pacer implantation in the next week or two.  Continue with primary cardiology 2. CAD      Negative Troponins      Last intervention July 2015 3. Ischemia CM (systolic)     Recent fluid overload/exacerbation treated outpatient      Appears compensated/euvolemic this morning     Continue management with primary cardiology 4. PAFib,      CHADS2vascs is at least 5, not on anticoagulation with hx of recurrent GIB 5. HTN 6. Hyperlipidemia   Signed, Tommye Standard, PA-C 01/04/2015 8:51 AM  EP Attending  Patient seen and examined. I agree with the findings as noted above. The patient is well known to me from prior clinic visit. He has had  problems with bradycardia, paroxysmal atrial fibrillation, intermittent heart block, and chronic systolic heart failure. He has no coronary disease. He was admitted to the hospital with a multitude of complaints. His exam demonstrates a regular bradycardia and his lungs are currently clear. Since admission, he has improved and now feels much better. The real question is whether he might benefit from a biventricular pacemaker insertion. We discussed this in the past, but with no clear-cut symptomatic bradycardia, and with initial improvement in his left ventricular function, we decided against this. His left ventricular function is now worse, and  he has some dry bradycardia with left bundle branch block. He has not had syncope. I discussed the treatment options with the patient. At this point I would hold off on inpatient device implantation unless he develops long pauses. In the absence of this, the patient has requested to see me back in the office for further discussion about pacemaker implantation. I did offer him the option of being scheduled to have this done in a week or 2 but he would like more discussion. In the meantime, he is encouraged to maintain a low-sodium diet, and we'll continue his current medical therapy. Ideally, we can uptitrate his metoprolol and amiodarone and with a pacemaker in place help him maintain sinus rhythm.  Cristopher Peru, M.D.

## 2015-01-04 NOTE — Progress Notes (Signed)
Patient rate/rhythm has consistently been 71's SB. Notified by CMT that patient rate was dropping to 39, nonsustained.  Patient was asleep, when RN woke up patient-his heart rate went back into the 40-50's. Will continue to monitor. Tresa Endo

## 2015-01-05 ENCOUNTER — Inpatient Hospital Stay (HOSPITAL_COMMUNITY): Payer: Medicare Other

## 2015-01-05 ENCOUNTER — Encounter (HOSPITAL_COMMUNITY): Payer: Self-pay | Admitting: Physician Assistant

## 2015-01-05 DIAGNOSIS — I5022 Chronic systolic (congestive) heart failure: Secondary | ICD-10-CM

## 2015-01-05 DIAGNOSIS — Z8719 Personal history of other diseases of the digestive system: Secondary | ICD-10-CM

## 2015-01-05 DIAGNOSIS — I519 Heart disease, unspecified: Secondary | ICD-10-CM

## 2015-01-05 DIAGNOSIS — R5382 Chronic fatigue, unspecified: Secondary | ICD-10-CM

## 2015-01-05 DIAGNOSIS — E876 Hypokalemia: Secondary | ICD-10-CM

## 2015-01-05 DIAGNOSIS — R531 Weakness: Secondary | ICD-10-CM

## 2015-01-05 LAB — NM MYOCAR MULTI W/SPECT W/WALL MOTION / EF
CHL CUP MPHR: 134 {beats}/min
CHL CUP RESTING HR STRESS: 53 {beats}/min
CSEPPHR: 73 {beats}/min
Estimated workload: 1 METS
Exercise duration (min): 5 min
Exercise duration (sec): 2 s
Percent HR: 54 %

## 2015-01-05 LAB — BASIC METABOLIC PANEL
Anion gap: 10 (ref 5–15)
BUN: 24 mg/dL — ABNORMAL HIGH (ref 6–20)
CO2: 28 mmol/L (ref 22–32)
Calcium: 9.2 mg/dL (ref 8.9–10.3)
Chloride: 97 mmol/L — ABNORMAL LOW (ref 101–111)
Creatinine, Ser: 1.44 mg/dL — ABNORMAL HIGH (ref 0.61–1.24)
GFR calc Af Amer: 49 mL/min — ABNORMAL LOW (ref >60)
GFR calc non Af Amer: 42 mL/min — ABNORMAL LOW (ref >60)
Glucose, Bld: 99 mg/dL (ref 65–99)
Potassium: 3.1 mmol/L — ABNORMAL LOW (ref 3.5–5.1)
Sodium: 135 mmol/L (ref 135–145)

## 2015-01-05 MED ORDER — FUROSEMIDE 40 MG PO TABS: 20.0000 mg | ORAL_TABLET | Freq: Every day | ORAL | Status: DC

## 2015-01-05 MED ORDER — TECHNETIUM TC 99M SESTAMIBI GENERIC - CARDIOLITE
30.0000 | Freq: Once | INTRAVENOUS | Status: AC | PRN
  Administered 2015-01-05: 30 via INTRAVENOUS

## 2015-01-05 MED ORDER — POTASSIUM CHLORIDE CRYS ER 20 MEQ PO TBCR
40.0000 meq | EXTENDED_RELEASE_TABLET | Freq: Once | ORAL | Status: AC
  Administered 2015-01-05: 40 meq via ORAL
  Filled 2015-01-05: qty 2

## 2015-01-05 MED ORDER — POTASSIUM CHLORIDE CRYS ER 20 MEQ PO TBCR: 20.0000 meq | EXTENDED_RELEASE_TABLET | Freq: Every day | ORAL | Status: DC

## 2015-01-05 MED ORDER — REGADENOSON 0.4 MG/5ML IV SOLN
0.4000 mg | Freq: Once | INTRAVENOUS | Status: AC
  Administered 2015-01-05: 0.4 mg via INTRAVENOUS
  Filled 2015-01-05: qty 5

## 2015-01-05 MED ORDER — REGADENOSON 0.4 MG/5ML IV SOLN
INTRAVENOUS | Status: AC
  Administered 2015-01-05: 0.4 mg via INTRAVENOUS
  Filled 2015-01-05: qty 5

## 2015-01-05 MED ORDER — TECHNETIUM TC 99M SESTAMIBI GENERIC - CARDIOLITE
10.0000 | Freq: Once | INTRAVENOUS | Status: AC | PRN
  Administered 2015-01-05: 10 via INTRAVENOUS

## 2015-01-05 NOTE — Progress Notes (Signed)
Discharge instructions have been completed for pt.  Although pt was adamant about being discharged, he now wishes to have dinner, "relax and watch some tv."  I informed pt that he was welcome to stay, and that there was no rush for him to go home.  Pt is in room, waiting for dinner to arrive.

## 2015-01-05 NOTE — Progress Notes (Signed)
Pt impatient about being discharged, and asked that I page physician to inquire about the delay.  PA-C is currently working on pt's discharge paperwork.

## 2015-01-05 NOTE — Progress Notes (Signed)
Patient presented for Lexiscan. Tolerated procedure well. Result to follow.   Dawnetta Copenhaver, PAC 

## 2015-01-05 NOTE — Discharge Summary (Addendum)
Discharge Summary   Patient ID: [name],  MRN: [id number], DOB/AGE: 1926/12/23 81 y.o.  Admit date: 01/03/2015 Discharge date: 01/05/2015  Primary Care Provider: Velna Hatchet Primary Cardiologist: Irish Lack / EP - Dr. Lovena Le  Discharge Diagnoses    Principal Problem:   Weakness Active Problems:   Bradycardia   Chronic systolic CHF (congestive heart failure) (HCC)   Essential hypertension, benign   Coronary atherosclerosis of native coronary artery   PAF (paroxysmal atrial fibrillation) (HCC)   Left bundle branch block   Fatigue   History of GI bleed   Hypokalemia  Acute on Chronic Combined Systolic & Diastolic HF - conflicting Echo & Myoview results.  Improved with gentle diuresis.  Allergies Allergies  Allergen Reactions  . Protonix [Pantoprazole Sodium] Other (See Comments)    Gi upset, insomnia  . Codeine Anxiety and Other (See Comments)    Insomnia/ hyper  . Prilosec [Omeprazole] Nausea And Vomiting and Other (See Comments)    GI upset, insomnia   . Warfarin Sodium Anxiety and Other (See Comments)    Hx gi bleed     Diagnostic Studies/Procedures    Nuc 01/05/15 - IMPRESSION: 1. No evidence of inducible ischemia. 2. Decreased wall motion involving the inferior septal wall, corresponding to an area of fixed defect. Likely scar. 3. Left ventricular ejection fraction 51% 4. Intermediate-risk stress test findings*.  CXR 01/04/15: IMPRESSION: Slight bibasilar lung scarring. No edema or consolidation. Extensive atherosclerotic calcification in aorta. There is also calcification in the right carotid artery. There are foci of coronary artery calcification as well. _____________   Hospital Course   [name] is an 81 y/o M with history CAD (prior MI and PCI of LAD, s/p complex PCI with rotational atherectomy/DES 08/2013 c/b brady arrest), PAF, LBBB, past GIB, LV dysfunction/chronic systolic CHF (as below), anxiety, chronic back pain, DJD, bradycardia,  probable CKD stage III, mild-mod MR, prostate CA, OSA (noncompliant with CPAP) who presented to the office on 01/03/15 with complaints of weakness and was admitted to the hospital.  To recap history, he saw Nathan Jester PA-C in flex clinic in April for atypical CP at which time Myoview showed EF 30-35%, no ischemia, + sinus bradycardia with HR in the 40s but unclear if symptomatic from this. He was seen back in May again rbadycardic but with no CHF on exam and no clear hx of symptomatic bradycardia. Nathan Ransom PA-C discussed with Dr. Lovena Le who did not recommend pacer at that time. Dr. Lovena Le did not want to place an ICD in him but would consider a BiV pacemaker if his LVF was confirmed to be significantly depressed by echo. He saw Nathan Merle NP last week and was weak and SOB. He was in AF with controlled VR. Limited echo 12/28/14; mod LVH, EF 30-35%, grade 1 DD, mild-mod MR, mildly dilated LA. The case was discussed with Dr. Lovena Le - in light of multiple past GI bleeds - he was not felt to be a candidate for anticoagulation and wanted to try and manage medically. Pacemaker was not felt warranted due to controlled VR - no signs of bradycardia. He was diuresed with Lasix and ARB was cut back. He went to the ER 01/01/15 with multiple symptoms. UA was negative for UTI, mild increase in BUN/creat as he may have been dehydrated. He presented back to the office 01/03/15 for follow-up with multiple complaints. He felt bad, needing to lie down, nauseated, dizzy, reporting falls, belly feeling bloated. Weight was down 4lbs. He was  admitted to the hospital for further workup. His rate was initially fine but he did have evidence of bradycardia during this admission with HR dripping into the 40s-50s while awake. He appeared compensated from a CHF standpoint with BNP 101 per notes but he was given IV Lasix with resultant -1.2L and later changed to oral Lasix -> 20mg  daily. TSH and Mg were normal. He ruled out for MI. It  was felt that perhaps he had overall failure to thrive. EP saw the patient who recommended to hold off on inpatient device implantation unless he had long pauses (which he did not), and to see back in the office for further discussion about pacemaker implantation. Although he remains not a candidate for anticoagulation at this time, Dr. Lovena Le felt ideally we could uptitrate his metoprolol and amiodarone and with a pacemaker in place help him maintain sinus rhythm. During admission he did require addition of potassium for hypokalemia. He underwent nuclear stress testing today which showed no evidence of inducible ischemia, decreased wall motion involving the inferior septal wall, corresponding to an area of fixed defect (likely scar), EF 51%. The EF is discordant with his prior echo so he may require a repeat limited echo for EF as outpatient. Will plan outpatient f/u with EP. I have sent a message to our [facility] office's EP scheduler requesting a follow-up appointment, and our office will call the patient with this information. Dr. Ellyn Hack has seen and examined the patient today and feels he is stable for discharge.  Other issues this admission: - Note that his Cr this admission tended to run in the 1.4 range. Review of prior Cr indicates a baseline somewhere around 1.1-1.3 (CKD III). Potassium supplementation was added so would consider rechecking a BMET at f/u appointment. - the patient was on both doxazosin and tamsulosin this admission and maintained he was taking both. BP was normal today on this regimen but we asked him as a precaution to please verify with his PCP that they want him on both. - The patient's Plavix was held on admission as he was >1 yr out from PCI. However, prior cath note indicated plan to continue antiplatelet therapy indefinitely. Since patient was not having evidence of bleeding on admission and had normal Hgb I spoke with Dr. Irish Lack who recommended to continue Plavix  monotherapy.   Consultants: EP _____________  Discharge Vitals Blood pressure 121/70, pulse 83, temperature 97.8 F (36.6 C), temperature source Oral, resp. rate 18, height 5\' 8"  (1.727 m), weight 183 lb 13.4 oz (83.389 kg), SpO2 93 %.  Filed Weights   01/03/15 1038 01/04/15 0520 01/05/15 0501  Weight: 184 lb 1.6 oz (83.507 kg) 183 lb 8 oz (83.235 kg) 183 lb 13.4 oz (83.389 kg)   _____________  Labs     CBC  Recent Labs  01/03/15 1145  WBC 7.4  NEUTROABS 5.2  HGB 13.8  HCT 39.6  MCV 91.9  PLT 161*   Basic Metabolic Panel  Recent Labs  01/03/15 1145 01/04/15 0328 01/05/15 0230  NA 131* 134* 135  K 3.7 3.3* 3.1*  CL 94* 97* 97*  CO2 28 24 28   GLUCOSE 121* 102* 99  BUN 20 21* 24*  CREATININE 1.46* 1.44* 1.44*  CALCIUM 9.3 9.2 9.2  MG 2.1  --   --    Liver Function Tests  Recent Labs  01/03/15 1145  AST 23  ALT 18  ALKPHOS 65  BILITOT 1.1  PROT 6.1*  ALBUMIN 4.1   Cardiac  Enzymes  Recent Labs  01/03/15 1145 01/03/15 1640 01/03/15 2251  TROPONINI <0.03 <0.03 <0.03   Thyroid Function Tests  Recent Labs  01/03/15 1145  TSH 0.932   _____________  Disposition   Pt is being discharged home today in good condition. _____________  Follow-up Plans & Appointments    Follow-up Information    Follow up with Cristopher Peru, MD.   Specialty:  Cardiology   Why:  Office will call you for your followup appointment. Call office if you have not heard back in 3 days.   Contact information:   [address] [place] [address] [phone]       Follow up with Velna Hatchet, MD.   Specialty:  Internal Medicine   Why:  Please double check with your primary doctor about taking both doxazosin and tamsulosin. Most people are on only one of these, not both, but you have both on your medicine list.   Contact information:   [place] [address] [phone]      Discharge Instructions    Diet - low sodium  heart healthy    Complete by:  As directed      Increase activity slowly    Complete by:  As directed   Your Lasix dose was changed - new prescription. You were also prescribed a potassium supplement.           Discharge Medications   Current Discharge Medication List    START taking these medications   Details  potassium chloride SA (K-DUR,KLOR-CON) 20 MEQ tablet Take 1 tablet (20 mEq total) by mouth daily. Qty: 90 tablet, Refills: 1      CONTINUE these medications which have CHANGED   Details  furosemide (LASIX) 40 MG tablet Take 0.5 tablets (20 mg total) by mouth daily. Qty: 45 tablet, Refills: 1      CONTINUE these medications which have NOT CHANGED   Details  amiodarone (PACERONE) 100 MG tablet Take 1 tablet (100 mg total) by mouth daily.     amoxicillin (AMOXIL) 500 MG capsule Take 2,000 mg by mouth See admin instructions. Take 4 capsules (2000 mg) by mouth one hour prior to dental appointments    bicalutamide (CASODEX) 50 MG tablet Take 50 mg by mouth daily.      clopidogrel (PLAVIX) 75 MG tablet Take 1 tablet (75 mg total) by mouth daily.     docusate sodium (COLACE) 100 MG capsule Take 100 mg by mouth daily.    dorzolamide-timolol (COSOPT) 22.3-6.8 MG/ML ophthalmic solution Place 1 drop into both eyes 2 (two) times daily.      doxazosin (CARDURA) 4 MG tablet Take 4 mg by mouth at bedtime.      finasteride (PROSCAR) 5 MG tablet Take 5 mg by mouth daily.    fluticasone (FLONASE) 50 MCG/ACT nasal spray Place 2 sprays into both nostrils as needed for allergies.      lipase/protease/amylase (CREON) 36000 UNITS CPEP capsule Take 36,000 Units by mouth 3 (three) times daily before meals.    LORazepam (ATIVAN) 0.5 MG tablet Take 0.5 mg by mouth every 8 (eight) hours as needed for anxiety.     losartan (COZAAR) 25 MG tablet Take 0.5 tablets (12.5 mg total) by mouth daily.     Multiple Vitamin (MULTIVITAMIN WITH MINERALS) TABS tablet Take 1 tablet by mouth  daily. Centrum Silver    nitroGLYCERIN (NITROSTAT) 0.4 MG SL tablet Place 1 tablet (0.4 mg total) under the tongue every 5 (five)  minutes as needed for chest pain.    Associated Diagnoses: Coronary artery disease involving native coronary artery of native heart with other form of angina pectoris (HCC)    Omega 3-Lutein-Zeaxanthin (ADVANCED EYE HEALTH PO) Take 1 tablet by mouth at bedtime.    OVER THE COUNTER MEDICATION Take 2 capsules by mouth 2 (two) times daily. Omega XL    Probiotic Product (PROBIOTIC PO) Take 2 tablets by mouth daily.    psyllium (METAMUCIL) 58.6 % packet Take 1 packet by mouth daily.     tamsulosin (FLOMAX) 0.4 MG CAPS capsule Take 0.4 mg by mouth daily. (patient confirmed he is taking this)    traMADol (ULTRAM) 50 MG tablet Take 50 mg by mouth 3 (three) times daily.       _____________   Kristeen Mans Labs/Studies   See above re: consideration for repeat BMET.  Duration of Discharge Encounter   Greater than 30 minutes including physician time.  Signed, Dayna Dunn PA-C 01/05/2015, 4:19 PM   I have seen, examined and evaluated the patient this a.m. along with Bhagat,Bhavinkumar PA-C. After reviewing all the available data and chart, I agree with his findings, examination as well as impression recommendations. We have replaced potassium for hyponatremia. With new wall motion abnormality and drop in EF, we ordered a Myoview which should be done today. If this appears to have no signs of ischemia, I think we can probably discharge him today with close follow-up.  Main treatment was diuresis and monitoring.   EP follow-up as well as follow with Dr. Irish Lack.  Patient feels much better today. Heart rate is much more responsive. I see what up into the 100 beats minute range with walking to the nursing station. Dyspnea is notably improved. We have started a diuretic.    Leonie Man, M.D., M.S. Interventional Cardiologist   Pager # [phone]

## 2015-01-05 NOTE — Progress Notes (Signed)
Orders received for pt discharge.  Discharge summary printed and reviewed with pt.  Explained medication regimen, and pt had no further questions at this time.  Also, stressed the importance of talking with his PCP concerning taking both Cardura AND Flomax at the same time.  PA-C has informed me that these two medications are not usually prescribed to both be taken, however, pt states that he does take both medications.  Pt has been instructed to verify this medication regimen with his PCP.  IV removed and site remains clean, dry, intact.  Telemetry removed.  Pt in stable condition and awaiting transport.

## 2015-01-05 NOTE — Progress Notes (Signed)
Patient Name: [name] Date of Encounter: 01/05/2015   SUBJECTIVE  Feeling well. No sob or palpitations. Felt mild R upper chest discomfort this morning.  CURRENT MEDS . amiodarone  100 mg Oral Daily  . bicalutamide  50 mg Oral Daily  . dorzolamide-timolol  1 drop Both Eyes BID  . doxazosin  4 mg Oral Daily  . famotidine  20 mg Oral Daily  . finasteride  5 mg Oral Daily  . fluticasone  2 spray Each Nare Daily  . furosemide  20 mg Oral Daily  . losartan  12.5 mg Oral Daily  . polyethylene glycol  17 g Oral Daily  . sodium chloride  3 mL Intravenous Q12H  . tamsulosin  0.4 mg Oral QPC breakfast    OBJECTIVE  Filed Vitals:   01/05/15 1050 01/05/15 1117 01/05/15 1119 01/05/15 1121  BP: 137/62 144/83 126/58 130/60  Pulse: 60 74 68 68  Temp:      TempSrc:      Resp:      Height:      Weight:      SpO2:        Intake/Output Summary (Last 24 hours) at 01/05/15 1122 Last data filed at 01/05/15 0900  Gross per 24 hour  Intake    840 ml  Output   1302 ml  Net   -462 ml   Filed Weights   01/03/15 1038 01/04/15 0520 01/05/15 0501  Weight: 184 lb 1.6 oz (83.507 kg) 183 lb 8 oz (83.235 kg) 183 lb 13.4 oz (83.389 kg)    PHYSICAL EXAM  General: Pleasant, NAD. Neuro: Alert and oriented X 3. Moves all extremities spontaneously. Psych: Normal affect. HEENT:  Normal  Neck: Supple without bruits or JVD. Lungs:  Resp regular and unlabored, CTA. TTP over R upper chest.  Heart: RRR no s3, s4, or murmurs. Abdomen: Soft, non-tender, non-distended, BS + x 4.  Extremities: No clubbing, cyanosis or edema. DP/PT/Radials 2+ and equal bilaterally.  Accessory Clinical Findings  CBC  Recent Labs  01/03/15 1145  WBC 7.4  NEUTROABS 5.2  HGB 13.8  HCT 39.6  MCV 91.9  PLT 809*   Basic Metabolic Panel  Recent Labs  01/03/15 1145 01/04/15 0328 01/05/15 0230  NA 131* 134* 135  K 3.7 3.3* 3.1*  CL 94* 97* 97*  CO2 28 24 28   GLUCOSE 121* 102* 99  BUN 20 21* 24*    CREATININE 1.46* 1.44* 1.44*  CALCIUM 9.3 9.2 9.2  MG 2.1  --   --    Liver Function Tests  Recent Labs  01/03/15 1145  AST 23  ALT 18  ALKPHOS 65  BILITOT 1.1  PROT 6.1*  ALBUMIN 4.1   Cardiac Enzymes  Recent Labs  01/03/15 1145 01/03/15 1640 01/03/15 2251  TROPONINI <0.03 <0.03 <0.03  Thyroid Function Tests  Recent Labs  01/03/15 1145  TSH 0.932    TELE  Seen in nuc med  Radiology/Studies  Dg Chest 2 View  01/04/2015  CLINICAL DATA:  Shortness of Breath EXAM: CHEST  2 VIEW COMPARISON:  January 01, 2015 FINDINGS: There is slight scarring in the bases. No edema or consolidation. Heart size and pulmonary vascularity are normal. No adenopathy. No pneumothorax. There is extensive atherosclerotic calcification in the aorta. No bone lesions. There is calcification in the right carotid artery as well as coronary artery calcification. IMPRESSION: Slight bibasilar lung scarring. No edema or consolidation. Extensive atherosclerotic calcification in aorta. There is also calcification in  the right carotid artery. There are foci of coronary artery calcification as well. Electronically Signed   By: Lowella Grip III M.D.   On: 01/04/2015 07:29   Dg Chest 2 View  01/01/2015  CLINICAL DATA:  81 year old male with chronic productive cough. Central chest pain for the past 3 days. EXAM: CHEST  2 VIEW COMPARISON:  Chest x-ray 12/14/2014. FINDINGS: Lung volumes are normal. No consolidative airspace disease. No pleural effusions. No pneumothorax. No pulmonary nodule or mass noted. Pulmonary vasculature and the cardiomediastinal silhouette are within normal limits. Atherosclerosis in the thoracic aorta. IMPRESSION: 1.  No radiographic evidence of acute cardiopulmonary disease. 2. Atherosclerosis. Electronically Signed   By: Vinnie Langton M.D.   On: 01/01/2015 12:08   Dg Chest 2 View  12/14/2014  CLINICAL DATA:  Shortness of breath for 6 months, worsening in last 2 weeks, chest pain  EXAM: CHEST  2 VIEW COMPARISON:  04/05/2014 FINDINGS: No acute infiltrate or pleural effusion. No pulmonary edema. Mild hyperinflation. Thoracic spine osteopenia. Atherosclerotic calcifications of thoracic aorta. IMPRESSION: No active cardiopulmonary disease. Electronically Signed   By: Lahoma Crocker M.D.   On: 12/14/2014 10:35    ASSESSMENT AND PLAN  1. Fatigue/ Weakness/ Near Syncope: likely multifactorial with bradycardia, afib and CHF. Telemetry shows bradycardia with a rate in the 40ss. TSH normal. Hgb normal at 13.8. Given drop in EF and new WMA. Myoview result pending.   2. Atrial Fibrillation w/ a CVR: TSH, and Mg both normal. K low today at 3.1. Supplement K. Continue on low dose amiodarone, 100 mg daily. Not an anticoagulation candidate due to recurrent GIB.   3. Chronic Systolic CHF: EF is 33-82%. BNP was only 101 on admit. Net I/O negatie 1.2 L since admit with IV lasix. No dyspnea. euvolemic on physical exam. Will switch to PO lasix. Low sodium diet. Continue ARB. No BB given bradycardia.   4. CAD: s/p PCI of the proximal LAD 08/2013. Patent RCA and left circumflex at time of cath in 2015. Recent 2D echo 12/28/14 shows drop in EF down from 50-55% to 30-35%. Also with new diffuse hypokinesis with abnormal septal motion. With unexplained drop in EF and h/o LAD disease, will plan for NST in the am to r/o ischemia. Pending Myoview result.   5. Bradycardia: HR currently in the 54s. He is on low dose amiodarone for PAF but no other AVN blocking agents. Per Dr. Lovena Le, EP will hold off on inpatient device implantation unless he develops long pauses. If he remains stable, plan will be to follow-up with Dr. Lovena Le in [place] clinic in 1-2 weeks to reassess and to re-discuss PPM   6. Hypokalemia - Suppliment  Signed, Bhagat,Bhavinkumar PA-C Pager [phone]  I have seen, examined and evaluated the patient this a.m. along with Bhagat,Bhavinkumar PA-C.  After reviewing all the available data and chart,   I agree with his findings, examination as well as impression recommendations. We have replaced potassium for hyponatremia. With new wall motion abnormality and drop in EF, we ordered a Myoview which should be done today. If this appears to have no signs of ischemia, I think we can probably discharge him today with close follow-up.  Main treatment was diuresis and monitoring.   EP follow-up as well as follow with Dr. Irish Lack.  Patient feels much better today. Heart rate is much more responsive. I see what up into the 100 beats minute range with walking to the nursing station. Dyspnea is notably improved. We have started a diuretic.  Leonie Man, M.D., M.S. Interventional Cardiologist   Pager # [phone]

## 2015-01-10 ENCOUNTER — Telehealth: Payer: Self-pay | Admitting: Nurse Practitioner

## 2015-01-10 ENCOUNTER — Other Ambulatory Visit: Payer: Self-pay | Admitting: *Deleted

## 2015-01-10 NOTE — Telephone Encounter (Signed)
Ok - then speak with Urology and see [name] as planned.

## 2015-01-10 NOTE — Telephone Encounter (Signed)
New message      Pt c/o medication issue:  1. Name of Medication: furosemide 2. How are you currently taking this medication (dosage and times per day)? 40mg  3. Are you having a reaction (difficulty breathing--STAT)?  no 4. What is your medication issue? Pt has gained 3lbs since Friday and he is not peeing a lot.  When he goes, it is not a lot.

## 2015-01-10 NOTE — Telephone Encounter (Signed)
Would resume the Flomax. Ok to still talk with urology - he was on several medicines for BPH  Ok to take 40 mg of Lasix today.   See Renee as planned.

## 2015-01-10 NOTE — Telephone Encounter (Signed)
According to the discharge summary - he is to be on just half a pill (20mg ) of lasix.  He needs to follow up with Dr. Lovena Le to discuss possible pacemaker implant.   Clarify how much Lasix he has had since discharge and see if we can get him in to see Dr. Lovena Le.

## 2015-01-10 NOTE — Telephone Encounter (Signed)
S/w Manuela Schwartz the caregiver.  Pt is taking ( 20 mg ) daily of lasix.  Pt is off flomax.  Pt has appointment with Renee NP on 11/22 to discuss pacemaker.  Caregiver stated output of urine is minimal and will call urologist.  Pt's weight is up 2.5 lbs since Friday.  Will send to [facility] to [place]

## 2015-01-10 NOTE — Telephone Encounter (Signed)
Since I last s/w with caregiver, susan this am stated has a steady stream of urine. Hadn't been on flomax in years and would not like to add another medication at this time.  Does not know if wants to give extra half of lasix today due to urine output. Will send to [place] to [place].

## 2015-01-12 DIAGNOSIS — Z6830 Body mass index (BMI) 30.0-30.9, adult: Secondary | ICD-10-CM | POA: Diagnosis not present

## 2015-01-12 DIAGNOSIS — J069 Acute upper respiratory infection, unspecified: Secondary | ICD-10-CM | POA: Diagnosis not present

## 2015-01-12 DIAGNOSIS — J029 Acute pharyngitis, unspecified: Secondary | ICD-10-CM | POA: Diagnosis not present

## 2015-01-17 NOTE — Progress Notes (Addendum)
Cardiology Office Note Date:  01/18/2015  Patient ID:  [name], DOB 1926-12-13, MRN [id number] PCP:  Velna Hatchet, MD  Cardiologist:  Dr. Irish Lack Electrophysiologist: Dr. Lovena Le    Chief Complaint: evaluation for CRTp, (Dr. Lovena Le)  History of Present Illness: [name] is a 81 y.o. male with history of PAFib not on a/c secondary to recurrent GIB history, CAD, meningioma, CRI, HTN, and bradycardia, LBBB, chronic CHF with LV dysfunction, OSA noncompliant with CPAP.    His bradycardia history includes a bradyarrest July 2015 after complex PCI, Prior to the intervention, his ejection fraction was in the 30-35% range. If his EF had remained depressed [name] was going to place a BiV pacemaker. Echocardiogram about a month after the intervention showed an improvement in his EF from 50-55%, without any clear symptoms of bradycardia and he has been observed outpatient. Since then his EF has Iver Nestle been found in the 30-35% range, most recently this month, he had a myoview in may 2015 without ischemia, EF 30-35%.  He was in [facility] 01/03/15 with CHF exacerbation, during this stay he was evaluated by Dr. Lovena Le for evaluation of bradycardia with HR's 40's-50's.  It was felt that there was no immediate need for PPM implantation given he was asymptomatic at that time and being treated for his CHF, to bee considered for  CRTpacer electively.  Today the pat feels like he is trying to get over a cold.  He reports having a sore throat/cough, saw his PMD's NP last week said they checked and told him he did not have strep or pneumonia and no other treatment was needed.  His caregiver is with him and states she thinks he is getting better. Occasionally productive, but no SOB.  He has self stopped his lasix, states he feels it or the potassium make him feel sick to his stomach.  The care giver states his weight has been stable.  It looks up here but she reports his weight at home withouth clotes this  morning was 182 and he tends to be around there, +/- 2lbs.  He reports chronically feeling lightheaded at times when up and about, this dayes back several months at least, no near syncope or syncope.  No CP, palpitations. No rest SOB, he denies symptoms of orthopnea, he does have some DOE that sounds at baseline.  The patient and caregiver report today that his urine output is back to normal, noting telephone calls with some concerns regarding output, this has corrected and he has no concerns.   Past Medical History  Diagnosis Date  . PAF (paroxysmal atrial fibrillation) (HCC)     not on coumadin due to hx of GI bleed.  . Mitral regurgitation     a. Mild-mod by echo 12/2014.  [name] GERD (gastroesophageal reflux disease)     occ. take prevacid  . Meningioma ([place]) right -sided w/ right VI palsy    followed by dr Gaynell Face  . Diverticulitis of colon with bleeding     s/p sigmoid resection '88  . History of GI diverticular bleed april 2012    transfused blood and resolved without surgical intervention  . Impotence, organic     s/p penile prosthesis 1990's  . BPH (benign prostatic hypertrophy) with urinary obstruction     s/p turp yrs ago  . Blood transfusion     "related to a surgery"  . Impaired hearing bilateral     only left hearing aid  . Bradycardia  a. Amio d/c'd 08/2013; brady arrest 08/2013 after PCI >>> recurrent AF >>> Amiodarone restarted. b. Pacemaker being considered in 11/2014.  . Cardiomyopathy ([place])     a. Echo (08/2013):  EF 30-35%, AS hypokinesis, Gr 1 diast dysfn, mild MR, mild LAE >>> b. improved EF 50-55% by echo 8/15. c. EF down again by echo 12/2014 to 30-35% but 51% by nuc.  [name] CAD (coronary artery disease)     a. s/p MI and prior PCI of LAD;  b. LHC (08/2013):  prox LAD 60-70%, mid LAD stents ok, ostial lesion at Dx jailed by stent, mild CFX and RCA disesase >>>  PCI (09/08/13):  rotational atherectomy + Promus DES to prox LAD  . Prostate cancer ([place]) 11/30/13    Gleason  8, volume 22.14 cc  . Anxiety   . Depression   . Hypertension   . Chronic systolic CHF (congestive heart failure) ([place])   . Myocardial infarction [facility]) 1980's- medical intervention    "so mild I didn't know I'd had it"  . Sleep apnea     non-compliant cpap  . DJD (degenerative joint disease) hips and knees    s/p bilateral total replacements  . DDD (degenerative disc disease)     chronic back pain  . Arthritis   . Chronic lower back pain   . Incomplete bladder emptying   . LBBB (left bundle branch block)   . CKD (chronic kidney disease), stage III     a. Per review of labs baseline Cr 1.1-1.3.    Past Surgical History  Procedure Laterality Date  . Cholecystectomy    . Total hip arthroplasty Right 03-25-08--  . Total hip arthroplasty Left 2005  . Total knee arthroplasty Right 2004  . Total knee arthroplasty Left 1997  . Inguinal hernia repair Bilateral   . Cataract extraction w/ intraocular lens  implant, bilateral Bilateral ~ 2000  . Appendectomy    . Transurethral resection of prostate  "years ago"  . Penile prosthesis implant  1990's  . Shoulder open rotator cuff repair Left   . Inner ear surgery Right yrs ago    "trying to get my hearing back  . Cardiac catheterization  2007    noncritical cad (results w/ chart)  . Coronary angioplasty with stent placement  08-03-08    drug-eluting stent x2 distal and mid lad  . Transurethral resection of prostate  01/08/2011    Procedure: TRANSURETHRAL RESECTION OF THE PROSTATE (TURP);  Surgeon: Franchot Gallo;  Location: [place];  Service: Urology;  Laterality: N/A;  GYRUS   . Prostate biopsy  11/30/13    Gleason 8, vol 22.14 cc  . Gamma knife radiation  2000    [facility] for meningioma, last eval 2013- no change  . Left heart catheterization with coronary angiogram N/A 09/04/2013    Procedure: LEFT HEART CATHETERIZATION WITH CORONARY ANGIOGRAM;  Surgeon: Jettie Booze, MD;  Location: [facility] CATH LAB;  Service:  Cardiovascular;  Laterality: N/A;  . Percutaneous coronary rotoblator intervention (pci-r) N/A 09/08/2013    Procedure: PERCUTANEOUS CORONARY ROTOBLATOR INTERVENTION (PCI-R);  Surgeon: Jettie Booze, MD;  Location: [facility] CATH LAB;  Service: Cardiovascular;  Laterality: N/A;  . Revision total knee arthroplasty Right   . Total hip revision Right 3-4 times  . Closed reduction hip dislocation Right "several"  . Esophagogastroduodenoscopy N/A 02/11/2014    Procedure: ESOPHAGOGASTRODUODENOSCOPY (EGD);  Surgeon: Cleotis Nipper, MD;  Location: [facility] ENDOSCOPY;  Service: Endoscopy;  Laterality: N/A;  Current Outpatient Prescriptions  Medication Sig Dispense Refill  . amiodarone (PACERONE) 100 MG tablet Take 1 tablet (100 mg total) by mouth daily. 30 tablet 6  . amoxicillin (AMOXIL) 500 MG capsule Take 2,000 mg by mouth See admin instructions. Take 4 capsules (2000 mg) by mouth one hour prior to dental appointments    . bicalutamide (CASODEX) 50 MG tablet Take 50 mg by mouth daily.   3  . clopidogrel (PLAVIX) 75 MG tablet Take 1 tablet (75 mg total) by mouth daily. 30 tablet 5  . docusate sodium (COLACE) 100 MG capsule Take 100 mg by mouth daily.    . dorzolamide-timolol (COSOPT) 22.3-6.8 MG/ML ophthalmic solution Place 1 drop into both eyes 2 (two) times daily.   2  . doxazosin (CARDURA) 4 MG tablet Take 4 mg by mouth at bedtime.   0  . finasteride (PROSCAR) 5 MG tablet Take 5 mg by mouth daily.    . fluticasone (FLONASE) 50 MCG/ACT nasal spray Place 2 sprays into both nostrils as needed for allergies.   1  . furosemide (LASIX) 40 MG tablet Take 0.5 tablets (20 mg total) by mouth daily. 45 tablet 1  . lipase/protease/amylase (CREON) 36000 UNITS CPEP capsule Take 36,000 Units by mouth 3 (three) times daily before meals.    [name] LORazepam (ATIVAN) 0.5 MG tablet Take 0.5 mg by mouth every 8 (eight) hours as needed for anxiety.     [name] losartan (COZAAR) 25 MG tablet Take 0.5 tablets (12.5 mg total) by mouth  daily. 30 tablet 3  . Multiple Vitamin (MULTIVITAMIN WITH MINERALS) TABS tablet Take 1 tablet by mouth daily. Centrum Silver    . nitroGLYCERIN (NITROSTAT) 0.4 MG SL tablet Place 1 tablet (0.4 mg total) under the tongue every 5 (five) minutes as needed for chest pain. 20 tablet 0  . Omega 3-Lutein-Zeaxanthin (ADVANCED EYE HEALTH PO) Take 1 tablet by mouth at bedtime.    . ondansetron (ZOFRAN) 4 MG tablet Take 1 tablet by mouth every 6 (six) hours as needed.  0  . OVER THE COUNTER MEDICATION Take 2 capsules by mouth 2 (two) times daily. Omega XL    . potassium chloride SA (K-DUR,KLOR-CON) 20 MEQ tablet Take 1 tablet (20 mEq total) by mouth daily. 90 tablet 1  . Probiotic Product (PROBIOTIC PO) Take 2 tablets by mouth daily.    . psyllium (METAMUCIL) 58.6 % packet Take 1 packet by mouth daily.     . traMADol (ULTRAM) 50 MG tablet Take 50 mg by mouth 3 (three) times daily.     No current facility-administered medications for this visit.    Allergies:   Protonix; Codeine; Prilosec; and Warfarin sodium   Social History:  The patient  reports that he quit smoking about 56 years ago. His smoking use included Cigarettes. He has a 14 pack-year smoking history. He has never used smokeless tobacco. He reports that he drinks alcohol. He reports that he does not use illicit drugs.   Family History:  The patient's family history includes Cancer in his brother and another family member; Emphysema in his father; Heart attack in his mother; Heart disease in his mother.  ROS:  Please see the history of present illness. All other systems are reviewed and otherwise negative.   PHYSICAL EXAM:  VS:  BP 120/62 mmHg  Pulse 44  Ht 5\' 8"  (1.727 m)  Wt 192 lb (87.091 kg)  BMI 29.20 kg/m2  SpO2 96% BMI: Body mass index is 29.2 kg/(m^2). Well nourished,  elderly male in no acute distress HEENT: normocephalic, atraumatic Neck: no JVD, carotid bruits or masses Cardiac:  normal S1, S2; RRR; bradycardic, soft SM, no  rubs, or gallops Lungs:  clear to auscultation bilaterally, no wheezing, rhonchi or rales Abd: soft, nontender,  + BS MS: no deformity or atrophy Ext: no edema, skin is dry Skin: warm and dry, no rash Neuro:  No gross deficits appreciated Psych: euthymic mood, full affect     EKG:  Done today shows SB, 44bpm, 1stdegree AVBlock, LAD, LBBB (appears unchanged from prior)  Recent Labs: 11/30/2014: Pro B Natriuretic peptide (BNP) 150.0* 01/03/2015: ALT 18; B Natriuretic Peptide 111.2*; Hemoglobin 13.8; Magnesium 2.1; Platelets 145*; TSH 0.932 01/05/2015: BUN 24*; Creatinine, Ser 1.44*; Potassium 3.1*; Sodium 135   01/03/15 : AST 23,  ALT 18, TSH 0.932  Estimated Creatinine Clearance: 39.5 mL/min (by C-G formula based on Cr of 1.44).   Wt Readings from Last 3 Encounters:  01/18/15 192 lb (87.091 kg)  01/05/15 183 lb 13.4 oz (83.389 kg)  01/03/15 190 lb 6.4 oz (86.365 kg)     Myoview Study Highlights from 06/2014    Intermediate stress nuclear study with chest tightness; ECG uninterpretable due to baseline LBBB; large, severe, fixed inferoseptal and apical defect consistent with prior infarct vs LBBB; no ischemia; moderate global reduction in LV function and LVE; study intermediate risk due to reduced LV function.                         12/28/14: Echocardiogram (limited) Study Conclusions - Left ventricle: Diffuse hypokinesis abnormal septal motion The cavity size was moderately dilated. Wall thickness was increased in a pattern of moderate LVH. Systolic function was moderately to severely reduced. The estimated ejection fraction was in the range of 30% to 35%. Doppler parameters are consistent with abnormal left ventricular relaxation (grade 1 diastolic dysfunction). - Mitral valve: Anteriorly directed MR with restricted posterior leaflet There was mild to moderate regurgitation. - Left atrium: The atrium was mildly dilated. - Atrial septum:  No defect or patent foramen ovale was identified.  Other studies reviewed: as above  ASSESSMENT AND PLAN:   1. Bradycardia              Symptomatic with exertional intolerance, dizziness             Plan for CRT pacer with Dr. Lovena Le.  Friday this week was offered, but this was not a good time for him, will scheduled for as soon as he can.             Hold Plavix 3 days prior              Any increasing/worsening symptoms instructed to seek medical attention             Risk/benefits discussed with the patient he wishes to proceed 2. CAD             Stable, last intervention July 2015             No angina             On Plavix             Recommend f/u with general cardiology service unclear why he is not on a statin, patient has mentioned reluctance for more medicines 3. ICM, CHF  on amio for his PAF, no BB with bradycardia, on ARB  He is compensated by exam, he has self stopped his lasix/K+ with reports of GI intolernace             He is instructed to try every other day lasix/potassium same dose 4.  Cough              It is improving, no fever or other signs of illness. No signs of fluid OL by exam              Monitor as long at it is improving, if he develops any other symptoms, fever, chills to notify us/PMD 5.  PAF             CHADs2Vasc = 5, not on a/c secondary to his hx of recurrent GIB     On amiodarone, HR appears stable from his hospital stay.    He requests the 200mg  tab to cut given this would not have a copay for him. 6. HTN             Appears well controlled  Disposition: F/u with 10 post-op check and 32mo with Dr. Lovena Le  Current medicines are reviewed at length with the patient today.  Haywood Lasso, PA-C 01/18/2015 9:04 AM     [facility] [address] [place] [address] [phone] (office)  [phone] (fax)

## 2015-01-18 ENCOUNTER — Encounter: Payer: Self-pay | Admitting: *Deleted

## 2015-01-18 ENCOUNTER — Encounter: Payer: Self-pay | Admitting: Physician Assistant

## 2015-01-18 ENCOUNTER — Ambulatory Visit (INDEPENDENT_AMBULATORY_CARE_PROVIDER_SITE_OTHER): Payer: Medicare Other | Admitting: Physician Assistant

## 2015-01-18 VITALS — BP 120/62 | HR 44 | Ht 68.0 in | Wt 192.0 lb

## 2015-01-18 DIAGNOSIS — I255 Ischemic cardiomyopathy: Secondary | ICD-10-CM

## 2015-01-18 DIAGNOSIS — R05 Cough: Secondary | ICD-10-CM

## 2015-01-18 DIAGNOSIS — I480 Paroxysmal atrial fibrillation: Secondary | ICD-10-CM | POA: Diagnosis not present

## 2015-01-18 DIAGNOSIS — I5022 Chronic systolic (congestive) heart failure: Secondary | ICD-10-CM | POA: Diagnosis not present

## 2015-01-18 DIAGNOSIS — R059 Cough, unspecified: Secondary | ICD-10-CM

## 2015-01-18 DIAGNOSIS — I10 Essential (primary) hypertension: Secondary | ICD-10-CM | POA: Diagnosis not present

## 2015-01-18 DIAGNOSIS — R001 Bradycardia, unspecified: Secondary | ICD-10-CM

## 2015-01-18 MED ORDER — POTASSIUM CHLORIDE CRYS ER 20 MEQ PO TBCR: 20.0000 meq | EXTENDED_RELEASE_TABLET | ORAL | Status: DC

## 2015-01-18 MED ORDER — FUROSEMIDE 40 MG PO TABS: 20.0000 mg | ORAL_TABLET | ORAL | Status: DC

## 2015-01-18 MED ORDER — AMIODARONE HCL 200 MG PO TABS: 100.0000 mg | ORAL_TABLET | Freq: Every day | ORAL | Status: DC

## 2015-01-18 NOTE — Patient Instructions (Addendum)
Medication Instructions:   START TAKING AMIODARONE 100MG  ONCE A DAY   START TAKING LASIX AND POTASSIUM EVERY OTHER DAY     If you need a refill on your cardiac medications before your next appointment, please call your pharmacy.  Labwork:  BMET AND CBC RETURN ON 01/28/15    Testing/Procedures: SEE LETTER FOR PPM IMPLANT    Follow-Up:  [place] WITH [facility]ICE CLINIC 02/11/15   91 DAY PHYS  PPM CK WITH DR Anner Crete OF  05/03/15      Any Other Special Instructions Will Be Listed Below (If Applicable).

## 2015-01-25 ENCOUNTER — Telehealth: Payer: Self-pay | Admitting: *Deleted

## 2015-01-25 ENCOUNTER — Other Ambulatory Visit: Payer: Self-pay | Admitting: Physician Assistant

## 2015-01-25 ENCOUNTER — Telehealth: Payer: Self-pay | Admitting: Internal Medicine

## 2015-01-25 NOTE — Telephone Encounter (Signed)
New Message  Pt calling to speak w/ Rn concerning his pre-procedure lab work. Please call back and discuss.

## 2015-01-25 NOTE — Telephone Encounter (Signed)
Spoke with patient and rescheduled his PPM implant to 12/5 from 12/6, same time.  He verbalized understanding

## 2015-01-25 NOTE — Telephone Encounter (Signed)
No change in lab appointment date or time

## 2015-01-27 ENCOUNTER — Encounter (HOSPITAL_COMMUNITY): Payer: Self-pay | Admitting: Pharmacy Technician

## 2015-01-28 ENCOUNTER — Other Ambulatory Visit (INDEPENDENT_AMBULATORY_CARE_PROVIDER_SITE_OTHER): Payer: Medicare Other | Admitting: *Deleted

## 2015-01-28 ENCOUNTER — Ambulatory Visit: Payer: Medicare Other | Admitting: Internal Medicine

## 2015-01-28 DIAGNOSIS — I509 Heart failure, unspecified: Secondary | ICD-10-CM | POA: Diagnosis not present

## 2015-01-28 DIAGNOSIS — I5022 Chronic systolic (congestive) heart failure: Secondary | ICD-10-CM | POA: Diagnosis not present

## 2015-01-28 DIAGNOSIS — R001 Bradycardia, unspecified: Secondary | ICD-10-CM | POA: Diagnosis not present

## 2015-01-28 DIAGNOSIS — I498 Other specified cardiac arrhythmias: Secondary | ICD-10-CM | POA: Diagnosis not present

## 2015-01-28 LAB — CBC WITH DIFFERENTIAL/PLATELET
BASOS PCT: 1 % (ref 0–1)
Basophils Absolute: 0.1 10*3/uL (ref 0.0–0.1)
EOS ABS: 0.2 10*3/uL (ref 0.0–0.7)
EOS PCT: 3 % (ref 0–5)
HCT: 39 % (ref 39.0–52.0)
Hemoglobin: 13.3 g/dL (ref 13.0–17.0)
LYMPHS ABS: 1.7 10*3/uL (ref 0.7–4.0)
Lymphocytes Relative: 24 % (ref 12–46)
MCH: 31.8 pg (ref 26.0–34.0)
MCHC: 34.1 g/dL (ref 30.0–36.0)
MCV: 93.3 fL (ref 78.0–100.0)
MONO ABS: 0.7 10*3/uL (ref 0.1–1.0)
MONOS PCT: 10 % (ref 3–12)
MPV: 10.5 fL (ref 8.6–12.4)
Neutro Abs: 4.3 10*3/uL (ref 1.7–7.7)
Neutrophils Relative %: 62 % (ref 43–77)
PLATELETS: 166 10*3/uL (ref 150–400)
RBC: 4.18 MIL/uL — ABNORMAL LOW (ref 4.22–5.81)
RDW: 13.3 % (ref 11.5–15.5)
WBC: 6.9 10*3/uL (ref 4.0–10.5)

## 2015-01-28 LAB — BASIC METABOLIC PANEL
BUN: 16 mg/dL (ref 7–25)
CALCIUM: 9.2 mg/dL (ref 8.6–10.3)
CHLORIDE: 101 mmol/L (ref 98–110)
CO2: 23 mmol/L (ref 20–31)
CREATININE: 1.29 mg/dL — AB (ref 0.70–1.11)
Glucose, Bld: 104 mg/dL — ABNORMAL HIGH (ref 65–99)
Potassium: 4.3 mmol/L (ref 3.5–5.3)
Sodium: 136 mmol/L (ref 135–146)

## 2015-01-30 MED ORDER — GENTAMICIN SULFATE 40 MG/ML IJ SOLN
80.0000 mg | INTRAMUSCULAR | Status: DC
  Filled 2015-01-30: qty 2

## 2015-01-30 MED ORDER — SODIUM CHLORIDE 0.9 % IR SOLN
80.0000 mg | Status: AC
  Administered 2015-01-31: 80 mg
  Filled 2015-01-30: qty 2

## 2015-01-30 MED ORDER — CEFAZOLIN SODIUM-DEXTROSE 2-3 GM-% IV SOLR
2.0000 g | INTRAVENOUS | Status: AC
  Administered 2015-01-31: 2 g via INTRAVENOUS

## 2015-01-30 MED ORDER — CEFAZOLIN SODIUM-DEXTROSE 2-3 GM-% IV SOLR: 2.0000 g | INTRAVENOUS | Status: DC

## 2015-01-31 ENCOUNTER — Ambulatory Visit (HOSPITAL_COMMUNITY)
Admission: RE | Admit: 2015-01-31 | Discharge: 2015-02-01 | Disposition: A | Discharge status: Home / Self Care | Payer: Medicare Other | Source: Ambulatory Visit | Attending: Internal Medicine | Admitting: Internal Medicine

## 2015-01-31 ENCOUNTER — Encounter (HOSPITAL_COMMUNITY): Payer: Self-pay | Admitting: *Deleted

## 2015-01-31 ENCOUNTER — Encounter (HOSPITAL_COMMUNITY)
Admission: RE | Disposition: A | Discharge status: Home / Self Care | Payer: Self-pay | Source: Ambulatory Visit | Attending: Internal Medicine

## 2015-01-31 DIAGNOSIS — N400 Enlarged prostate without lower urinary tract symptoms: Secondary | ICD-10-CM | POA: Insufficient documentation

## 2015-01-31 DIAGNOSIS — I2510 Atherosclerotic heart disease of native coronary artery without angina pectoris: Secondary | ICD-10-CM | POA: Insufficient documentation

## 2015-01-31 DIAGNOSIS — I4430 Unspecified atrioventricular block: Secondary | ICD-10-CM | POA: Diagnosis not present

## 2015-01-31 DIAGNOSIS — I255 Ischemic cardiomyopathy: Secondary | ICD-10-CM | POA: Insufficient documentation

## 2015-01-31 DIAGNOSIS — Z87891 Personal history of nicotine dependence: Secondary | ICD-10-CM | POA: Insufficient documentation

## 2015-01-31 DIAGNOSIS — I10 Essential (primary) hypertension: Secondary | ICD-10-CM | POA: Insufficient documentation

## 2015-01-31 DIAGNOSIS — I480 Paroxysmal atrial fibrillation: Secondary | ICD-10-CM | POA: Insufficient documentation

## 2015-01-31 DIAGNOSIS — R001 Bradycardia, unspecified: Secondary | ICD-10-CM | POA: Diagnosis not present

## 2015-01-31 DIAGNOSIS — I5022 Chronic systolic (congestive) heart failure: Secondary | ICD-10-CM | POA: Diagnosis present

## 2015-01-31 DIAGNOSIS — Z79899 Other long term (current) drug therapy: Secondary | ICD-10-CM | POA: Diagnosis not present

## 2015-01-31 DIAGNOSIS — I252 Old myocardial infarction: Secondary | ICD-10-CM | POA: Insufficient documentation

## 2015-01-31 DIAGNOSIS — Z7902 Long term (current) use of antithrombotics/antiplatelets: Secondary | ICD-10-CM | POA: Insufficient documentation

## 2015-01-31 DIAGNOSIS — Z955 Presence of coronary angioplasty implant and graft: Secondary | ICD-10-CM | POA: Diagnosis not present

## 2015-01-31 DIAGNOSIS — Z8546 Personal history of malignant neoplasm of prostate: Secondary | ICD-10-CM | POA: Insufficient documentation

## 2015-01-31 DIAGNOSIS — Z950 Presence of cardiac pacemaker: Secondary | ICD-10-CM

## 2015-01-31 DIAGNOSIS — I5042 Chronic combined systolic (congestive) and diastolic (congestive) heart failure: Secondary | ICD-10-CM | POA: Diagnosis present

## 2015-01-31 DIAGNOSIS — R079 Chest pain, unspecified: Secondary | ICD-10-CM

## 2015-01-31 DIAGNOSIS — Z96653 Presence of artificial knee joint, bilateral: Secondary | ICD-10-CM | POA: Diagnosis not present

## 2015-01-31 DIAGNOSIS — I495 Sick sinus syndrome: Secondary | ICD-10-CM | POA: Diagnosis not present

## 2015-01-31 DIAGNOSIS — I509 Heart failure, unspecified: Secondary | ICD-10-CM | POA: Diagnosis not present

## 2015-01-31 DIAGNOSIS — E785 Hyperlipidemia, unspecified: Secondary | ICD-10-CM | POA: Diagnosis not present

## 2015-01-31 DIAGNOSIS — I447 Left bundle-branch block, unspecified: Secondary | ICD-10-CM | POA: Diagnosis not present

## 2015-01-31 DIAGNOSIS — Z96643 Presence of artificial hip joint, bilateral: Secondary | ICD-10-CM | POA: Insufficient documentation

## 2015-01-31 HISTORY — PX: EP IMPLANTABLE DEVICE: SHX172B

## 2015-01-31 LAB — SURGICAL PCR SCREEN
MRSA, PCR: NEGATIVE
STAPHYLOCOCCUS AUREUS: NEGATIVE

## 2015-01-31 SURGERY — BIV PACEMAKER INSERTION CRT-P

## 2015-01-31 MED ORDER — HEPARIN (PORCINE) IN NACL 2-0.9 UNIT/ML-% IJ SOLN
INTRAMUSCULAR | Status: DC | PRN
  Administered 2015-01-31: 08:00:00

## 2015-01-31 MED ORDER — FLUTICASONE PROPIONATE 50 MCG/ACT NA SUSP
2.0000 | NASAL | Status: DC | PRN
  Filled 2015-01-31: qty 16

## 2015-01-31 MED ORDER — SODIUM CHLORIDE 0.9 % IR SOLN
Status: AC
  Filled 2015-01-31: qty 2

## 2015-01-31 MED ORDER — MUPIROCIN 2 % EX OINT
TOPICAL_OINTMENT | CUTANEOUS | Status: AC
  Administered 2015-01-31: 1 via TOPICAL
  Filled 2015-01-31: qty 22

## 2015-01-31 MED ORDER — ACETAMINOPHEN 325 MG PO TABS: 325.0000 mg | ORAL_TABLET | ORAL | Status: DC | PRN

## 2015-01-31 MED ORDER — NITROGLYCERIN 0.4 MG SL SUBL
0.4000 mg | SUBLINGUAL_TABLET | SUBLINGUAL | Status: DC | PRN
  Administered 2015-02-01: 0.4 mg via SUBLINGUAL
  Filled 2015-01-31: qty 1

## 2015-01-31 MED ORDER — DOXAZOSIN MESYLATE 8 MG PO TABS
4.0000 mg | ORAL_TABLET | Freq: Every day | ORAL | Status: DC
  Administered 2015-01-31: 4 mg via ORAL
  Filled 2015-01-31: qty 1

## 2015-01-31 MED ORDER — LOSARTAN POTASSIUM 25 MG PO TABS
12.5000 mg | ORAL_TABLET | Freq: Every day | ORAL | Status: DC
  Administered 2015-01-31 – 2015-02-01 (×2): 12.5 mg via ORAL
  Filled 2015-01-31 (×2): qty 1

## 2015-01-31 MED ORDER — POTASSIUM CHLORIDE CRYS ER 20 MEQ PO TBCR
20.0000 meq | EXTENDED_RELEASE_TABLET | ORAL | Status: DC
  Administered 2015-02-01: 20 meq via ORAL
  Filled 2015-01-31 (×2): qty 1

## 2015-01-31 MED ORDER — MIDAZOLAM HCL 5 MG/5ML IJ SOLN
INTRAMUSCULAR | Status: AC
  Filled 2015-01-31: qty 5

## 2015-01-31 MED ORDER — LORAZEPAM 0.5 MG PO TABS
0.5000 mg | ORAL_TABLET | Freq: Three times a day (TID) | ORAL | Status: DC | PRN
  Administered 2015-02-01: 0.5 mg via ORAL
  Filled 2015-01-31: qty 1

## 2015-01-31 MED ORDER — FENTANYL CITRATE (PF) 100 MCG/2ML IJ SOLN
INTRAMUSCULAR | Status: AC
  Filled 2015-01-31: qty 2

## 2015-01-31 MED ORDER — AMIODARONE HCL 100 MG PO TABS
100.0000 mg | ORAL_TABLET | Freq: Every day | ORAL | Status: DC
  Administered 2015-01-31: 100 mg via ORAL
  Filled 2015-01-31: qty 1

## 2015-01-31 MED ORDER — SODIUM CHLORIDE 0.9 % IV SOLN
INTRAVENOUS | Status: DC
Start: 2015-01-31 — End: 2015-01-31
  Administered 2015-01-31: 06:00:00 via INTRAVENOUS

## 2015-01-31 MED ORDER — CHLORHEXIDINE GLUCONATE 4 % EX LIQD
60.0000 mL | Freq: Once | CUTANEOUS | Status: DC
  Filled 2015-01-31: qty 60

## 2015-01-31 MED ORDER — FENTANYL CITRATE (PF) 100 MCG/2ML IJ SOLN
INTRAMUSCULAR | Status: DC | PRN
  Administered 2015-01-31 (×6): 12.5 ug via INTRAVENOUS

## 2015-01-31 MED ORDER — ADULT MULTIVITAMIN W/MINERALS CH
1.0000 | ORAL_TABLET | Freq: Every day | ORAL | Status: DC
  Administered 2015-01-31 – 2015-02-01 (×2): 1 via ORAL
  Filled 2015-01-31 (×2): qty 1

## 2015-01-31 MED ORDER — CLOPIDOGREL BISULFATE 75 MG PO TABS
75.0000 mg | ORAL_TABLET | Freq: Every day | ORAL | Status: DC
  Administered 2015-02-01: 75 mg via ORAL
  Filled 2015-01-31: qty 1

## 2015-01-31 MED ORDER — DORZOLAMIDE HCL-TIMOLOL MAL 2-0.5 % OP SOLN
1.0000 [drp] | Freq: Two times a day (BID) | OPHTHALMIC | Status: DC
  Administered 2015-01-31 – 2015-02-01 (×3): 1 [drp] via OPHTHALMIC
  Filled 2015-01-31: qty 10

## 2015-01-31 MED ORDER — BICALUTAMIDE 50 MG PO TABS
50.0000 mg | ORAL_TABLET | Freq: Every day | ORAL | Status: DC
  Administered 2015-01-31 – 2015-02-01 (×2): 50 mg via ORAL
  Filled 2015-01-31 (×2): qty 1

## 2015-01-31 MED ORDER — PANCRELIPASE (LIP-PROT-AMYL) 36000-114000 UNITS PO CPEP
36000.0000 [IU] | ORAL_CAPSULE | Freq: Three times a day (TID) | ORAL | Status: DC
  Administered 2015-01-31 – 2015-02-01 (×2): 36000 [IU] via ORAL
  Filled 2015-01-31 (×6): qty 1

## 2015-01-31 MED ORDER — HEPARIN (PORCINE) IN NACL 2-0.9 UNIT/ML-% IJ SOLN
INTRAMUSCULAR | Status: AC
  Filled 2015-01-31: qty 500

## 2015-01-31 MED ORDER — FINASTERIDE 5 MG PO TABS
5.0000 mg | ORAL_TABLET | Freq: Every day | ORAL | Status: DC
  Administered 2015-01-31 – 2015-02-01 (×2): 5 mg via ORAL
  Filled 2015-01-31 (×2): qty 1

## 2015-01-31 MED ORDER — ONDANSETRON HCL 4 MG/2ML IJ SOLN
4.0000 mg | Freq: Four times a day (QID) | INTRAMUSCULAR | Status: DC | PRN
  Administered 2015-02-01: 4 mg via INTRAVENOUS
  Filled 2015-01-31: qty 2

## 2015-01-31 MED ORDER — DOCUSATE SODIUM 100 MG PO CAPS
100.0000 mg | ORAL_CAPSULE | Freq: Every day | ORAL | Status: DC
  Administered 2015-01-31 – 2015-02-01 (×2): 100 mg via ORAL
  Filled 2015-01-31 (×2): qty 1

## 2015-01-31 MED ORDER — CEFAZOLIN SODIUM 1-5 GM-% IV SOLN: 1.0000 g | Freq: Four times a day (QID) | INTRAVENOUS | Status: DC

## 2015-01-31 MED ORDER — ONDANSETRON HCL 4 MG/2ML IJ SOLN: 4.0000 mg | Freq: Four times a day (QID) | INTRAMUSCULAR | Status: DC | PRN

## 2015-01-31 MED ORDER — CEFAZOLIN SODIUM 1-5 GM-% IV SOLN
1.0000 g | Freq: Four times a day (QID) | INTRAVENOUS | Status: AC
  Administered 2015-01-31 – 2015-02-01 (×3): 1 g via INTRAVENOUS
  Filled 2015-01-31 (×3): qty 50

## 2015-01-31 MED ORDER — AMOXICILLIN 500 MG PO CAPS: 2000.0000 mg | ORAL_CAPSULE | ORAL | Status: DC

## 2015-01-31 MED ORDER — ONDANSETRON HCL 4 MG PO TABS: 4.0000 mg | ORAL_TABLET | Freq: Four times a day (QID) | ORAL | Status: DC | PRN

## 2015-01-31 MED ORDER — LIDOCAINE HCL (PF) 1 % IJ SOLN
INTRAMUSCULAR | Status: AC
  Filled 2015-01-31: qty 60

## 2015-01-31 MED ORDER — FUROSEMIDE 20 MG PO TABS
20.0000 mg | ORAL_TABLET | ORAL | Status: DC
  Administered 2015-02-01: 20 mg via ORAL
  Filled 2015-01-31 (×2): qty 1

## 2015-01-31 MED ORDER — MIDAZOLAM HCL 5 MG/5ML IJ SOLN
INTRAMUSCULAR | Status: DC | PRN
  Administered 2015-01-31 (×6): 1 mg via INTRAVENOUS

## 2015-01-31 MED ORDER — IOHEXOL 350 MG/ML SOLN
INTRAVENOUS | Status: DC | PRN
  Administered 2015-01-31: 40 mL via INTRACARDIAC

## 2015-01-31 MED ORDER — MUPIROCIN 2 % EX OINT
1.0000 "application " | TOPICAL_OINTMENT | Freq: Once | CUTANEOUS | Status: AC
  Administered 2015-01-31: 1 via TOPICAL
  Filled 2015-01-31: qty 22

## 2015-01-31 MED ORDER — CEFAZOLIN SODIUM-DEXTROSE 2-3 GM-% IV SOLR
INTRAVENOUS | Status: AC
  Filled 2015-01-31: qty 50

## 2015-01-31 MED ORDER — LIDOCAINE HCL (PF) 1 % IJ SOLN
INTRAMUSCULAR | Status: DC | PRN
  Administered 2015-01-31: 57 mL via INTRADERMAL

## 2015-01-31 SURGICAL SUPPLY — 21 items
ALLURE CRT PM3262 (Pacemaker) ×3 IMPLANT
CABLE SURGICAL S-101-97-12 (CABLE) ×2 IMPLANT
CATH ATTAIN SELECT 6238TEL (CATHETERS) ×2 IMPLANT
CATH CPS DIRECT 135 DS2C020 (CATHETERS) ×3 IMPLANT
CATH CPS DIRECT WD DS2C021 (CATHETERS) ×2 IMPLANT
CATH CPS QUART SUB DS2N026-59 (CATHETERS) ×2 IMPLANT
CATH HEX JOS 2-5-2 65CM 6F REP (CATHETERS) ×2 IMPLANT
CPS IMPLANT KIT 410190 (MISCELLANEOUS) ×3 IMPLANT
KIT ESSENTIALS PG (KITS) ×2 IMPLANT
LEAD QUARTET 1456Q-86 (Lead) IMPLANT
LEAD TENDRIL SDX 2088TC-52CM (Lead) ×2 IMPLANT
LEAD TENDRIL SDX 2088TC-58CM (Lead) ×2 IMPLANT
PACEMAKER ALLURE CRT (Pacemaker) IMPLANT
PAD DEFIB LIFELINK (PAD) ×3 IMPLANT
QUARTET 1456Q-86 (Lead) ×3 IMPLANT
SHEATH CLASSIC 7F (SHEATH) ×4 IMPLANT
SLITTER UNIVERSAL DS2A003 (MISCELLANEOUS) ×2 IMPLANT
TRAY PACEMAKER INSERTION (PACKS) ×2 IMPLANT
WIRE ACUITY WHISPER EDS 4648 (WIRE) ×2 IMPLANT
WIRE LUGE 182CM (WIRE) ×2 IMPLANT
WIRE MAILMAN 182CM (WIRE) ×2 IMPLANT

## 2015-01-31 NOTE — Progress Notes (Signed)
Orthopedic Tech Progress Note Patient Details:  [name] 13-Sep-1926 [id number] Patient has arm sling Patient ID: [name], male   DOB: 19-Jul-1926, 81 y.o.   MRN: [id number]   Braulio Bosch 01/31/2015, 3:23 PM

## 2015-01-31 NOTE — Discharge Instructions (Signed)
° ° °  Supplemental Discharge Instructions for  Pacemaker/Defibrillator Patients  Activity No heavy lifting or vigorous activity with your left/right arm for 6 to 8 weeks.  Do not raise your left/right arm above your head for one week.  Gradually raise your affected arm as drawn below.          02/04/15                     02/05/15                  02/06/15                 02/07/15 __  NO DRIVING for  1week   ; you may begin driving on  [id number]   .  WOUND CARE - Keep the wound area clean and dry.  Do not get this area wet for one week. No showers for one week; you may shower on 01/28/15    . - The tape/steri-strips on your wound will fall off; do not pull them off.  No bandage is needed on the site.  DO  NOT apply any creams, oils, or ointments to the wound area. - If you notice any drainage or discharge from the wound, any swelling or bruising at the site, or you develop a fever > 101? F after you are discharged home, call the office at once.  Special Instructions - You are still able to use cellular telephones; use the ear opposite the side where you have your pacemaker/defibrillator.  Avoid carrying your cellular phone near your device. - When traveling through airports, show security personnel your identification card to avoid being screened in the metal detectors.  Ask the security personnel to use the hand wand. - Avoid arc welding equipment, MRI testing (magnetic resonance imaging), TENS units (transcutaneous nerve stimulators).  Call the office for questions about other devices. - Avoid electrical appliances that are in poor condition or are not properly grounded. - Microwave ovens are safe to be near or to operate.  Additional information for defibrillator patients should your device go off: - If your device goes off ONCE and you feel fine afterward, notify the device clinic nurses. - If your device goes off ONCE and you do not feel well afterward, call 911. - If your device goes  off TWICE, call 911. - If your device goes off THREE times in one day, call 911.  DO NOT DRIVE YOURSELF OR A FAMILY MEMBER WITH A DEFIBRILLATOR TO THE HOSPITAL--CALL 911.

## 2015-01-31 NOTE — H&P (View-Only) (Signed)
 ELECTROPHYSIOLOGY CONSULT NOTE    Patient ID: [name] MRN: [id number], DOB/AGE: 07/14/1926 81 y.o.  Admit date: 01/03/2015 Date of Consult: 01/04/2015 Primary Physician: HOLWERDA, SCOTT, MD Primary Cardiologist: Dr. Varanasi  Reason for Consultation: Bradycardia  HPI: [name] is a 81 y.o. male with a PMHx of CAD, ischmenic CM that had improved last year, HTN, bradycardia, PAFib not on anticoagulation with history of recurrent GIB, dyslipidemia was sent to [facility] via EMS yesterday from the office with what was felt possibly symptomatic bradycardia.  The patient reports that he feels his anxiety and associated fairly peristent GI upset have a role in his he is feeling, sleeping poorly with insomina as well.  He states some degree of chronic fatigue over the years, though in the last few weeks fairly acutely worse, with dizzy spells, a fall that he denies full syncope, but was to weak to get himself off the floor, this event prompted a ER visit 01/01/15 that he was eventually d/c to home without any particular findings outside of some mild dehydration.  His symptoms continued though no further falls.  He has been seen out patient a number of times, and yesterday referred via EMS to the hospital with ongoing symptoms of dizziness/weakness and bradycardia.  He had a complex PCI of his LAD in July 2015 involving rotational atherectomy and DES. Prior to the intervention, his ejection fraction was in the 30-35% range. If his EF had remained depressed Dr Cielle Aguila was going to place a BiV pacemaker. Echocardiogram about a month after the intervention showed an improvement in his EF from 50-55%, without any clear symptoms of bradycardia and he has been observed outpatient.  Since then his EF has againi been found in the 30-35% range, most recently this month, he had a myoview in may 2015 without ischemia, EF 30-35%  He denies any active symptoms at this time, he denies CP, has 3 negative  Troponins.  Denies any rest symptoms, no SOB or dizziness this morning.  He does feel like he gets winded pretty easily with ambulation.   Past Medical History  Diagnosis Date  . PAF (paroxysmal atrial fibrillation) (HCC)     controlled w/ amiodarone; not on coumadin due to hx of GI bleed  . Mitral regurgitation   . GERD (gastroesophageal reflux disease)     occ. take prevacid  . Meningioma (HCC) right -sided w/ right VI palsy    followed by dr hickling  . Diverticulitis of colon with bleeding     s/p sigmoid resection '88  . History of GI diverticular bleed april 2012    transfused blood and resolved without surgical intervention  . Impotence, organic     s/p penile prosthesis 1990's  . BPH (benign prostatic hypertrophy) with urinary obstruction     s/p turp yrs ago  . Blood transfusion     "related to a surgery"  . Impaired hearing bilateral     only left hearing aid  . Bradycardia     Amio d/c'd 08/2013; brady arrest 08/2013 after PCI >>> recurrent AF >>> Amiodarone restarted (may need BiV pacer if EF does not recover; consider Holter if + sx's)  . Cardiomyopathy (HCC)     a. Echo (08/2013):  EF 30-35%, AS hypokinesis, Gr 1 diast dysfn, mild MR, mild LAE >>> b. improved EF 50-55% by echo 8/15  . CAD (coronary artery disease)     a. s/p MI and prior PCI of LAD;  b. LHC (08/2013):    prox LAD 60-70%, mid LAD stents ok, ostial lesion at Dx jailed by stent, mild CFX and RCA disesase >>>  PCI (09/08/13):  rotational atherectomy + Promus DES to prox LAD  . Mitral valve disorders   . Hx of echocardiogram     Echo (8/15):  Mod LVH, EF 50-55%, Gr 1 DD, mild MR, mild RAE  . Prostate cancer (HCC) 11/30/13    Gleason 8, volume 22.14 cc  . Atrial fibrillation (HCC)   . Heart murmur     with mitral valve regurgitation  . Anxiety   . Depression   . Hypertension   . CHF (congestive heart failure) (HCC)   . Myocardial infarction (HCC) 1980's- medical intervention    "so mild I didn't know I'd  had it"  . Sleep apnea     non-compliant cpap  . DJD (degenerative joint disease) hips and knees    s/p bilateral total replacements  . DDD (degenerative disc disease)     chronic back pain  . Arthritis     "I'm covered up w/it"  . Chronic lower back pain   . Incomplete bladder emptying      Surgical History:  Past Surgical History  Procedure Laterality Date  . Cholecystectomy    . Total hip arthroplasty Right 03-25-08--  . Total hip arthroplasty Left 2005  . Total knee arthroplasty Right 2004  . Total knee arthroplasty Left 1997  . Inguinal hernia repair Bilateral   . Cataract extraction w/ intraocular lens  implant, bilateral Bilateral ~ 2000  . Appendectomy    . Transurethral resection of prostate  "years ago"  . Penile prosthesis implant  1990's  . Shoulder open rotator cuff repair Left   . Inner ear surgery Right yrs ago    "trying to get my hearing back  . Cardiac catheterization  2007    noncritical cad (results w/ chart)  . Coronary angioplasty with stent placement  08-03-08    drug-eluting stent x2 distal and mid lad  . Transurethral resection of prostate  01/08/2011    Procedure: TRANSURETHRAL RESECTION OF THE PROSTATE (TURP);  Surgeon: Stephen Dahlstedt;  Location: [facility];  Service: Urology;  Laterality: N/A;  GYRUS   . Prostate biopsy  11/30/13    Gleason 8, vol 22.14 cc  . Gamma knife radiation  2000    [facility] for meningioma, last eval 2013- no change  . Left heart catheterization with coronary angiogram N/A 09/04/2013    Procedure: LEFT HEART CATHETERIZATION WITH CORONARY ANGIOGRAM;  Surgeon: Jayadeep S Varanasi, MD;  Location: [facility];  Service: Cardiovascular;  Laterality: N/A;  . Percutaneous coronary rotoblator intervention (pci-r) N/A 09/08/2013    Procedure: PERCUTANEOUS CORONARY ROTOBLATOR INTERVENTION (PCI-R);  Surgeon: Jayadeep S Varanasi, MD;  Location: [facility];  Service: Cardiovascular;  Laterality: N/A;  . Revision total  knee arthroplasty Right   . Total hip revision Right 3-4 times  . Closed reduction hip dislocation Right "several"  . Esophagogastroduodenoscopy N/A 02/11/2014    Procedure: ESOPHAGOGASTRODUODENOSCOPY (EGD);  Surgeon: Robert Buccini V, MD;  Location: [facility];  Service: Endoscopy;  Laterality: N/A;     Prescriptions prior to admission  Medication Sig Dispense Refill Last Dose  . amiodarone (PACERONE) 100 MG tablet Take 1 tablet (100 mg total) by mouth daily. 30 tablet 6 01/03/2015 at Unknown time  . amoxicillin (AMOXIL) 500 MG capsule Take 2,000 mg by mouth See admin instructions. Take 4 capsules (2000 mg) by mouth one hour prior to   dental appointments   12/16/2014  . bicalutamide (CASODEX) 50 MG tablet Take 50 mg by mouth daily.   3 01/03/2015 at Unknown time  . clopidogrel (PLAVIX) 75 MG tablet Take 1 tablet (75 mg total) by mouth daily. 30 tablet 5 01/03/2015 at Unknown time  . docusate sodium (COLACE) 100 MG capsule Take 100 mg by mouth daily.   01/03/2015 at Unknown time  . dorzolamide-timolol (COSOPT) 22.3-6.8 MG/ML ophthalmic solution Place 1 drop into both eyes 2 (two) times daily.   2 01/03/2015 at 630  . doxazosin (CARDURA) 4 MG tablet Take 4 mg by mouth at bedtime.   0 01/02/2015 at Unknown time  . finasteride (PROSCAR) 5 MG tablet Take 5 mg by mouth daily.   01/03/2015 at Unknown time  . fluticasone (FLONASE) 50 MCG/ACT nasal spray Place 2 sprays into both nostrils as needed for allergies.   1 few days ago  . furosemide (LASIX) 40 MG tablet TAKE 1&1/2 TABLETS BY MOUTH DAILY FOR 3 DAYS THEN 1 TABLET DAILY (Patient taking differently: TAKE 1 TABLET BY MOUTH DAILY) 90 tablet 3 01/02/2015 at Unknown time  . lipase/protease/amylase (CREON) 36000 UNITS CPEP capsule Take 36,000 Units by mouth 3 (three) times daily before meals.   unknown at Unknown time  . LORazepam (ATIVAN) 0.5 MG tablet Take 0.5 mg by mouth every 8 (eight) hours as needed for anxiety.    several weeks ago  . losartan (COZAAR)  25 MG tablet Take 0.5 tablets (12.5 mg total) by mouth daily. 30 tablet 3 01/03/2015 at Unknown time  . Multiple Vitamin (MULTIVITAMIN WITH MINERALS) TABS tablet Take 1 tablet by mouth daily. Centrum Silver   01/03/2015 at Unknown time  . nitroGLYCERIN (NITROSTAT) 0.4 MG SL tablet Place 1 tablet (0.4 mg total) under the tongue every 5 (five) minutes as needed for chest pain. 20 tablet 0 unknown  . Omega 3-Lutein-Zeaxanthin (ADVANCED EYE HEALTH PO) Take 1 tablet by mouth at bedtime.   01/02/2015 at Unknown time  . OVER THE COUNTER MEDICATION Take 2 capsules by mouth 2 (two) times daily. Omega XL   01/03/2015 at am  . Probiotic Product (PROBIOTIC PO) Take 2 tablets by mouth daily.   01/03/2015 at Unknown time  . psyllium (METAMUCIL) 58.6 % packet Take 1 packet by mouth daily.    01/02/2015 at Unknown time  . traMADol (ULTRAM) 50 MG tablet Take 50 mg by mouth 3 (three) times daily.   01/03/2015 at am    Inpatient Medications:  . amiodarone  100 mg Oral Daily  . bicalutamide  50 mg Oral Daily  . dorzolamide-timolol  1 drop Both Eyes BID  . doxazosin  4 mg Oral Daily  . famotidine  20 mg Oral Daily  . finasteride  5 mg Oral Daily  . fluticasone  2 spray Each Nare Daily  . furosemide  20 mg Intravenous Q12H  . losartan  12.5 mg Oral Daily  . polyethylene glycol  17 g Oral Daily  . sodium chloride  3 mL Intravenous Q12H  . tamsulosin  0.4 mg Oral QPC breakfast    Allergies:  Allergies  Allergen Reactions  . Protonix [Pantoprazole Sodium] Other (See Comments)    Gi upset, insomnia  . Codeine Anxiety and Other (See Comments)    Insomnia/ hyper  . Prilosec [Omeprazole] Nausea And Vomiting and Other (See Comments)    GI upset, insomnia   . Warfarin Sodium Anxiety and Other (See Comments)    Hx gi bleed       Social History   Social History  . Marital Status: Widowed    Spouse Name: N/A  . Number of Children: N/A  . Years of Education: N/A   Occupational History  . Not on file.   Social  History Main Topics  . Smoking status: Former Smoker -- 1.00 packs/day for 14 years    Types: Cigarettes    Quit date: 01/05/1959  . Smokeless tobacco: Never Used  . Alcohol Use: 0.0 oz/week     Comment: 02/09/2014 "I'm not drinking anymore"  . Drug Use: No  . Sexual Activity: No   Other Topics Concern  . Not on file   Social History Narrative     Family History  Problem Relation Age of Onset  . Heart disease Mother   . Heart attack Mother   . Cancer    . Emphysema Father   . Cancer Brother     liver cancer     Review of Systems: All other systems reviewed and are otherwise negative except as noted above.  Physical Exam: Filed Vitals:   01/03/15 2038 01/04/15 0118 01/04/15 0520 01/04/15 0819  BP: 142/70 97/49 148/67 119/62  Pulse: 60 52 43 59  Temp: 97.6 F (36.4 C) 97.7 F (36.5 C) 97 F (36.1 C) 97.3 F (36.3 C)  TempSrc: Oral Oral Oral Oral  Resp: 18 20 20 18  Height:      Weight:   183 lb 8 oz (83.235 kg)   SpO2: 98% 96% 98% 97%    GEN- The patient is well appearing, alert and oriented x 3 today.   HEENT: normocephalic, atraumatic; sclera clear, conjunctiva pink; hearing intact; oropharynx clear; neck supple, no JVP Lymph- no cervical lymphadenopathy Lungs- Clear to ausculation bilaterally, normal work of breathing.  No wheezes, rales, rhonchi Heart- Regular rate and rhythm, 1/6 systolic murmur, no rubs or gallops, PMI not laterally displaced GI- soft, non-tender, non-distended, bowel sounds present Extremities- no clubbing, cyanosis, or edema; wearing support stockings MS- no significant deformity or atrophy Skin- warm and dry, no rash or lesion Psych- euthymic mood, full affect Neuro- no gross deficits observed  Labs:   Lab Results  Component Value Date   WBC 7.4 01/03/2015   HGB 13.8 01/03/2015   HCT 39.6 01/03/2015   MCV 91.9 01/03/2015   PLT 145* 01/03/2015    Recent Labs Lab 01/03/15 1145 01/04/15 0328  NA 131* 134*  K 3.7 3.3*  CL  94* 97*  CO2 28 24  BUN 20 21*  CREATININE 1.46* 1.44*  CALCIUM 9.3 9.2  PROT 6.1*  --   BILITOT 1.1  --   ALKPHOS 65  --   ALT 18  --   AST 23  --   GLUCOSE 121* 102*      Radiology/Studies:  Dg Chest 2 View 01/04/2015  CLINICAL DATA:  Shortness of Breath EXAM: CHEST  2 VIEW COMPARISON:  January 01, 2015 FINDINGS: There is slight scarring in the bases. No edema or consolidation. Heart size and pulmonary vascularity are normal. No adenopathy. No pneumothorax. There is extensive atherosclerotic calcification in the aorta. No bone lesions. There is calcification in the right carotid artery as well as coronary artery calcification. IMPRESSION: Slight bibasilar lung scarring. No edema or consolidation. Extensive atherosclerotic calcification in aorta. There is also calcification in the right carotid artery. There are foci of coronary artery calcification as well. Electronically Signed   By: William  Woodruff III M.D.   On: 01/04/2015 07:29   Dg Chest   2 View 01/01/2015  CLINICAL DATA:  88-year-old male with chronic productive cough. Central chest pain for the past 3 days. EXAM: CHEST  2 VIEW COMPARISON:  Chest x-ray 12/14/2014. FINDINGS: Lung volumes are normal. No consolidative airspace disease. No pleural effusions. No pneumothorax. No pulmonary nodule or mass noted. Pulmonary vasculature and the cardiomediastinal silhouette are within normal limits. Atherosclerosis in the thoracic aorta. IMPRESSION: 1.  No radiographic evidence of acute cardiopulmonary disease. 2. Atherosclerosis. Electronically Signed   By: Daniel  Entrikin M.D.   On: 01/01/2015 12:08    EKG: SB, 53bpm, LBBB, LAD TELEMETRY: SB 40's-50's, 1st degree AV block  Myoview Study Highlights from 06/2014    Intermediate stress nuclear study with chest tightness; ECG uninterpretable due to baseline LBBB; large, severe, fixed inferoseptal and apical defect consistent with prior infarct vs LBBB; no ischemia; moderate global  reduction in LV function and LVE; study intermediate risk due to reduced LV function.                    12/28/14: Echocardiogram (limited) Study Conclusions - Left ventricle: Diffuse hypokinesis abnormal septal motion The cavity size was moderately dilated. Wall thickness was increased in a pattern of moderate LVH. Systolic function was moderately to severely reduced. The estimated ejection fraction was in the range of 30% to 35%. Doppler parameters are consistent with abnormal left ventricular relaxation (grade 1 diastolic dysfunction). - Mitral valve: Anteriorly directed MR with restricted posterior leaflet There was mild to moderate regurgitation. - Left atrium: The atrium was mildly dilated. - Atrial septum: No defect or patent foramen ovale was identified.  Assessment and Plan:  1. Bradycardia     It has been unclear over the last year wether he has had any symptomatic bradycardia and device implant has been deferred     He is on Amiodarone 100mg daily, this morning is in a sinus rhythm, recently he has been in AF     At his time, no evidence of urgent need for PPM implantation with HR in the 40's-50's while awake     We would prefer that he be tuned up with general cardiology, allowed to go home and plan for BiVe Pacer implantation in the next week or two.  Continue with primary cardiology 2. CAD      Negative Troponins      Last intervention July 2015 3. Ischemia CM (systolic)     Recent fluid overload/exacerbation treated outpatient      Appears compensated/euvolemic this morning     Continue management with primary cardiology 4. PAFib,      CHADS2vascs is at least 5, not on anticoagulation with hx of recurrent GIB 5. HTN 6. Hyperlipidemia   Signed, Renee Ursuy, PA-C 01/04/2015 8:51 AM  EP Attending  Patient seen and examined. I agree with the findings as noted above. The patient is well known to me from prior clinic visit. He has had  problems with bradycardia, paroxysmal atrial fibrillation, intermittent heart block, and chronic systolic heart failure. He has no coronary disease. He was admitted to the hospital with a multitude of complaints. His exam demonstrates a regular bradycardia and his lungs are currently clear. Since admission, he has improved and now feels much better. The real question is whether he might benefit from a biventricular pacemaker insertion. We discussed this in the past, but with no clear-cut symptomatic bradycardia, and with initial improvement in his left ventricular function, we decided against this. His left ventricular function is now worse, and   he has some dry bradycardia with left bundle branch block. He has not had syncope. I discussed the treatment options with the patient. At this point I would hold off on inpatient device implantation unless he develops long pauses. In the absence of this, the patient has requested to see me back in the office for further discussion about pacemaker implantation. I did offer him the option of being scheduled to have this done in a week or 2 but he would like more discussion. In the meantime, he is encouraged to maintain a low-sodium diet, and we'll continue his current medical therapy. Ideally, we can uptitrate his metoprolol and amiodarone and with a pacemaker in place help him maintain sinus rhythm.  Franciscojavier Wronski, M.D.       

## 2015-01-31 NOTE — Discharge Summary (Addendum)
ELECTROPHYSIOLOGY PROCEDURE DISCHARGE SUMMARY    Patient ID: [name],  MRN: [id number], DOB/AGE: 02-Jan-1927 81 y.o.  Admit date: 01/31/2015 Discharge date: 02/01/2015  Primary Care Physician: Velna Hatchet, MD Primary Cardiologist: Dr. Irish Lack Electrophysiologist: Dr. Lovena Le  Primary Discharge Diagnosis:  1. Symptomatic bradycardia, ICM, status post CRT pacemaker implantation this admission  Secondary Discharge Diagnosis:  1. PAFib CHADS2Vasc = 5, felt not an a/c candidate with GIB history 2. HTN 3. CAD (last PCI to LAD with DES July 2015) 4. ICM  Allergies  Allergen Reactions  . Protonix [Pantoprazole Sodium] Other (See Comments)    Gi upset, insomnia  . Codeine Anxiety and Other (See Comments)    Insomnia/ hyper  . Prilosec [Omeprazole] Nausea And Vomiting and Other (See Comments)    GI upset, insomnia   . Warfarin Sodium Anxiety and Other (See Comments)    Hx gi bleed      Procedures This Admission:  1.  Implantation of a CRT PPM on 01/31/15  by [name].  The patient received a [employer] Jude (serial number [id number]) PPM with model number, a [employer] model 2008 (serial number [id number]) right atrial lead and a [place] 2008 (serial number [id number]) right ventricular lead, and St. Jude(serial number [id number]) LV lead.  There were no immediate post procedure complications. 2.  CXR on 02/01/15 demonstrated no pneumothorax status post device implantation.   Brief HPI: [name] is a 81 y.o. male was referred to electrophysiology in the outpatient setting for consideration of PPM implantation.  Past medical history includes CAD, PAFib, symptomatic with his AFib on amiodarone with then symptomatic bradycardia, ICM with chroncic systolic CHF, EF [id number].  The patient is felt not to be an a/c candidate and his PAF is controlled with amiodarone, which causes him to have dizziness with bradycardia, and was decided to proceed with PPM implant to  better treat his PAFib.  Risks, benefits, and alternatives to PPM implantation were reviewed with the patient who wished to proceed.   Hospital Course:  The patient was admitted and underwent implantation of a STJ CRT pacer with details as outlined above.  He  was monitored on telemetry overnight which demonstrated SR/V pacing and AV pacing and then atrial fib with a slow VR. In addition he developed some diaphragmatic stimulation and his device was reprogrammed to prevent this.  Left chest was without hematoma or ecchymosis.  The patient was woken early this morning with CP, he located to epigastrum/subxiphoid area, he was given s/l NTG followed by a near syncopal event (sitting in chair), vitals at the time were reported wnl and he was paced rhythm, CXR was done without pneumothorax or acute findings.  He was treated with morphine and ativan, feeling well this morning.  The device was interrogated and found to be functioning normally.  CXR was obtained and demonstrated no pneumothorax status post device implantation.  Wound care, arm mobility, and restrictions were reviewed with the patient.  The patient was examined by Dr. Lovena Le and considered stable for discharge to home.    Physical Exam: Filed Vitals:   02/01/15 0535 02/01/15 0942 02/01/15 0945 02/01/15 1213  BP: 101/59 108/58  120/69  Pulse: 60 60  81  Temp:  97.6 F (36.4 C)  97.4 F (36.3 C)  TempSrc:  Oral  Oral  Resp: 20 18  18   Height:      Weight:   185 lb 11.2 oz (84.233 kg)  SpO2: 93% 95%  96%   GEN- The patient is well appearing, alert and oriented x 3 today.   HEENT: normocephalic, atraumatic; sclera clear, conjunctiva pink; hearing intact; oropharynx clear; neck supple, no JVP Lungs- Clear to ausculation bilaterally, normal work of breathing.  No wheezes, rales, rhonchi Heart- Regular rate and rhythm, no murmurs, rubs or gallops, PMI not laterally displaced GI- soft, non-tender, non-distended Extremities- no clubbing,  cyanosis, or edema MS- no significant deformity or atrophy Skin- warm and dry, no rash or lesion, left chest without hematoma, minimal ecchymosis Psych- euthymic mood, full affect Neuro- no gross deficits   Labs:   Lab Results  Component Value Date   WBC 6.9 01/28/2015   HGB 13.3 01/28/2015   HCT 39.0 01/28/2015   MCV 93.3 01/28/2015   PLT 166 01/28/2015     Recent Labs Lab 01/28/15 1109  NA 136  K 4.3  CL 101  CO2 23  BUN 16  CREATININE 1.29*  CALCIUM 9.2  GLUCOSE 104*   02/01/15: CXR IMPRESSION: Left pectoral pacemaker device. No pneumothorax. Minimal bibasilar dependent atelectasis. No focal consolidation.  Discharge Medications:    Medication List    TAKE these medications        ADVANCED EYE HEALTH PO  Take 1 tablet by mouth at bedtime.     amiodarone 200 MG tablet  Commonly known as:  PACERONE  Take 0.5 tablets (100 mg total) by mouth daily.     amoxicillin 500 MG capsule  Commonly known as:  AMOXIL  Take 2,000 mg by mouth See admin instructions. Take 4 capsules (2000 mg) by mouth one hour prior to dental appointments     bicalutamide 50 MG tablet  Commonly known as:  CASODEX  Take 50 mg by mouth daily.     clopidogrel 75 MG tablet  Commonly known as:  PLAVIX  Take 1 tablet (75 mg total) by mouth daily.     CREON 36000 UNITS Cpep capsule  Generic drug:  lipase/protease/amylase  Take 36,000 Units by mouth 3 (three) times daily before meals.     docusate sodium 100 MG capsule  Commonly known as:  COLACE  Take 100 mg by mouth daily.     dorzolamide-timolol 22.3-6.8 MG/ML ophthalmic solution  Commonly known as:  COSOPT  Place 1 drop into both eyes 2 (two) times daily.     doxazosin 4 MG tablet  Commonly known as:  CARDURA  Take 4 mg by mouth at bedtime.     finasteride 5 MG tablet  Commonly known as:  PROSCAR  Take 5 mg by mouth daily.     fluticasone 50 MCG/ACT nasal spray  Commonly known as:  FLONASE  Place 2 sprays into both  nostrils as needed for allergies.     furosemide 40 MG tablet  Commonly known as:  LASIX  Take 0.5 tablets (20 mg total) by mouth every other day.     LORazepam 0.5 MG tablet  Commonly known as:  ATIVAN  Take 0.5 mg by mouth every 8 (eight) hours as needed for anxiety.     losartan 25 MG tablet  Commonly known as:  COZAAR  Take 0.5 tablets (12.5 mg total) by mouth daily.     metoprolol tartrate 25 MG tablet  Commonly known as:  LOPRESSOR  Take 1 tablet (25 mg total) by mouth 2 (two) times daily.     multivitamin with minerals Tabs tablet  Take 1 tablet by mouth daily. Centrum Silver  nitroGLYCERIN 0.4 MG SL tablet  Commonly known as:  NITROSTAT  Place 1 tablet (0.4 mg total) under the tongue every 5 (five) minutes as needed for chest pain.     ondansetron 4 MG tablet  Commonly known as:  ZOFRAN  Take 1 tablet by mouth every 6 (six) hours as needed for nausea.     OVER THE COUNTER MEDICATION  Take 2 capsules by mouth 2 (two) times daily. Omega XL     potassium chloride SA 20 MEQ tablet  Commonly known as:  K-DUR,KLOR-CON  Take 1 tablet (20 mEq total) by mouth every other day.     PROBIOTIC PO  Take 2 tablets by mouth daily.     psyllium 58.6 % packet  Commonly known as:  METAMUCIL  Take 1 packet by mouth daily.     traMADol 50 MG tablet  Commonly known as:  ULTRAM  Take 50 mg by mouth 3 (three) times daily.        Disposition:  Discharge Instructions    Diet - low sodium heart healthy    Complete by:  As directed      Increase activity slowly    Complete by:  As directed           Follow-up Information    Follow up with [facility]. Go on 02/14/2015.   Specialty:  Cardiology   Why:  @ 9 AM for wound check.   Contact information:   [address], [place] [phone]      Follow up with Cristopher Peru, MD On 05/05/2015.   Specialty:  Cardiology   Why:  9:15AM   Contact information:   [id number] [place] [address] [phone]       Duration of Discharge Encounter: Greater than 30 minutes including physician time.  Venetia Night, PA-C 02/01/2015 5:46 PM  EP Attending  Patient seen and examined. Agree with findings as noted above. The patient has had a single episode of chest pain, worrisome for microperforation. His leads are working normally and he is currently pain free (only had a single episode) and will be observed in the hospital today before discharge. He does have PAF and is not a anti-coag candidate. If he has more pain, will require lead revision.  Mikle Bosworth.D.

## 2015-01-31 NOTE — Interval H&P Note (Signed)
History and Physical Interval Note:  01/31/2015 7:36 AM  [name]  has presented today for surgery, with the diagnosis of bradicardia  The various methods of treatment have been discussed with the patient and family. After consideration of risks, benefits and other options for treatment, the patient has consented to  Procedure(s): Pacemaker Implant (N/A) as a surgical intervention .  The patient's history has been reviewed, patient examined, no change in status, stable for surgery.  I have reviewed the patient's chart and labs.  Questions were answered to the patient's satisfaction.     Mikle Bosworth.D.

## 2015-02-01 ENCOUNTER — Encounter (HOSPITAL_COMMUNITY): Payer: Self-pay | Admitting: Internal Medicine

## 2015-02-01 ENCOUNTER — Ambulatory Visit (HOSPITAL_COMMUNITY): Payer: Medicare Other

## 2015-02-01 DIAGNOSIS — I495 Sick sinus syndrome: Secondary | ICD-10-CM | POA: Diagnosis not present

## 2015-02-01 DIAGNOSIS — I255 Ischemic cardiomyopathy: Secondary | ICD-10-CM | POA: Diagnosis not present

## 2015-02-01 DIAGNOSIS — Z950 Presence of cardiac pacemaker: Secondary | ICD-10-CM | POA: Diagnosis not present

## 2015-02-01 DIAGNOSIS — R001 Bradycardia, unspecified: Secondary | ICD-10-CM | POA: Diagnosis not present

## 2015-02-01 DIAGNOSIS — I10 Essential (primary) hypertension: Secondary | ICD-10-CM | POA: Diagnosis not present

## 2015-02-01 DIAGNOSIS — I509 Heart failure, unspecified: Secondary | ICD-10-CM | POA: Diagnosis not present

## 2015-02-01 DIAGNOSIS — I447 Left bundle-branch block, unspecified: Secondary | ICD-10-CM | POA: Diagnosis not present

## 2015-02-01 DIAGNOSIS — I4430 Unspecified atrioventricular block: Secondary | ICD-10-CM | POA: Diagnosis not present

## 2015-02-01 DIAGNOSIS — I2510 Atherosclerotic heart disease of native coronary artery without angina pectoris: Secondary | ICD-10-CM | POA: Diagnosis not present

## 2015-02-01 DIAGNOSIS — J9811 Atelectasis: Secondary | ICD-10-CM | POA: Diagnosis not present

## 2015-02-01 MED ORDER — METOPROLOL TARTRATE 25 MG PO TABS: 25.0000 mg | ORAL_TABLET | Freq: Two times a day (BID) | ORAL | Status: DC

## 2015-02-01 MED ORDER — MORPHINE SULFATE (PF) 2 MG/ML IV SOLN
INTRAVENOUS | Status: AC
  Filled 2015-02-01: qty 1

## 2015-02-01 MED ORDER — AMIODARONE HCL 200 MG PO TABS
200.0000 mg | ORAL_TABLET | Freq: Two times a day (BID) | ORAL | Status: DC
  Administered 2015-02-01: 200 mg via ORAL
  Filled 2015-02-01: qty 1

## 2015-02-01 MED ORDER — KETOROLAC TROMETHAMINE 30 MG/ML IJ SOLN
30.0000 mg | Freq: Three times a day (TID) | INTRAMUSCULAR | Status: DC
  Administered 2015-02-01: 30 mg via INTRAVENOUS
  Filled 2015-02-01: qty 1

## 2015-02-01 MED ORDER — METOPROLOL TARTRATE 25 MG PO TABS
25.0000 mg | ORAL_TABLET | Freq: Two times a day (BID) | ORAL | Status: DC
  Administered 2015-02-01: 25 mg via ORAL
  Filled 2015-02-01: qty 1

## 2015-02-01 MED ORDER — MORPHINE SULFATE (PF) 2 MG/ML IV SOLN
2.0000 mg | Freq: Once | INTRAVENOUS | Status: AC
  Administered 2015-02-01: 2 mg via INTRAVENOUS

## 2015-02-01 NOTE — Progress Notes (Addendum)
Pt awaken c/o chest pain on the sternum area,sharp with PS 10/10. Pt stated " Call the doctor now, I want to see the doctor now". VS and 12 L EKG taken to chart, Nitro SL given once per prn order. Pt then had near syncopal episode after giving Nitro SL.RRT called. Seen and examined by the RRT Broke. Notified Dr. Candyce Churn about the events and ordered for stat CXR and 2mg  IV Morphine once. Pt stated " don't leave me in my room and  If , you need to be back in 2 min". Ativan PO given as prn order for anxiety. Will continue to monitor pt

## 2015-02-01 NOTE — Progress Notes (Signed)
Pt has orders to be discharged. Discharge instructions given and pt has no additional questions at this time. Medication regimen reviewed and pt educated. Pt verbalized understanding and has no additional questions. Telemetry box removed. IV removed and site in good condition. Caregiver Manuela Schwartz at bedside and verbalized understanding of discharge orders. Post pacemaker instructions given to patient and caregiver. Pt stable and waiting for transportation.   Maurene Capes RN

## 2015-02-01 NOTE — Progress Notes (Signed)
Call received per floor RN at [phone] regarding Pt with chest pain POD 1 s/p pacemaker placement. Pt in chair, EKG completed, NTG x 1 given and Pt having near syncopal event. RN advised to put Pt in bed while RRT RN en route. Upon my arrival 0450 Pt found in bed 4/10 chest pain, po2 92-94% on 2 LNC, BP 138/83 HR 60 AV paced . Alert oriented follows commands, anxious. Dr. Candyce Churn Cardiologist paged and call back received. Updated per floor RN regarding status VS and EKG, CXR 2 view ordered and morphine 2 mg IVP. Pt chest pain improving to 3/10.  Morphine 2 mg given. Pt escorted per RRT RN to xray without issue. Upon arrival back to room Pt with some increased anxiety, reassured and 0.5 mg Ativan given. RN advised to monitor Pt closely and notify Provider and myself for worsening changes.

## 2015-02-03 ENCOUNTER — Ambulatory Visit (INDEPENDENT_AMBULATORY_CARE_PROVIDER_SITE_OTHER): Payer: Medicare Other | Admitting: *Deleted

## 2015-02-03 ENCOUNTER — Telehealth: Payer: Self-pay | Admitting: Internal Medicine

## 2015-02-03 DIAGNOSIS — T82897A Other specified complication of cardiac prosthetic devices, implants and grafts, initial encounter: Secondary | ICD-10-CM

## 2015-02-03 LAB — CUP PACEART INCLINIC DEVICE CHECK
Battery Remaining Longevity: 62.4
Brady Statistic RV Percent Paced: 93 %
Date Time Interrogation Session: 20161208154925
Implantable Lead Implant Date: 20161205
Implantable Lead Location: 753858
Implantable Lead Location: 753860
Lead Channel Pacing Threshold Amplitude: 0.5 V
Lead Channel Pacing Threshold Amplitude: 0.5 V
Lead Channel Pacing Threshold Pulse Width: 0.4 ms
Lead Channel Sensing Intrinsic Amplitude: 1 mV
Lead Channel Sensing Intrinsic Amplitude: 12 mV
Lead Channel Setting Pacing Amplitude: 3.5 V
Lead Channel Setting Pacing Amplitude: 3.5 V
Lead Channel Setting Pacing Pulse Width: 0.4 ms
Lead Channel Setting Sensing Sensitivity: 2 mV
MDC IDC LEAD IMPLANT DT: 20161205
MDC IDC LEAD IMPLANT DT: 20161205
MDC IDC LEAD LOCATION: 753859
MDC IDC MSMT BATTERY VOLTAGE: 2.98 V
MDC IDC MSMT LEADCHNL LV IMPEDANCE VALUE: 412.5 Ohm
MDC IDC MSMT LEADCHNL LV PACING THRESHOLD AMPLITUDE: 0.5 V
MDC IDC MSMT LEADCHNL LV PACING THRESHOLD PULSEWIDTH: 1 ms
MDC IDC MSMT LEADCHNL LV PACING THRESHOLD PULSEWIDTH: 1 ms
MDC IDC MSMT LEADCHNL RA IMPEDANCE VALUE: 462.5 Ohm
MDC IDC MSMT LEADCHNL RV IMPEDANCE VALUE: 525 Ohm
MDC IDC MSMT LEADCHNL RV PACING THRESHOLD AMPLITUDE: 0.5 V
MDC IDC MSMT LEADCHNL RV PACING THRESHOLD PULSEWIDTH: 0.4 ms
MDC IDC PG SERIAL: 7802901
MDC IDC SET LEADCHNL LV PACING AMPLITUDE: 1 V
MDC IDC SET LEADCHNL LV PACING PULSEWIDTH: 1 ms
MDC IDC STAT BRADY RA PERCENT PACED: 42 %

## 2015-02-03 NOTE — Progress Notes (Signed)
Pt having diaphragmatic stimulation. Visible while in office. Checked by [employer]. Stim will occur while recumbent. LV vector changed from P4-Can to M2-Can. MTR changed from 120 to 100bpm. LV output changed from 3.0 to 1.0V. Follow up as planned. ROV w/ device clinic for full wound check 02/14/15.

## 2015-02-03 NOTE — Telephone Encounter (Signed)
New Message   Pt caregiver is calling because pt is having the hiccups   And caregiver wants to know if it can be adjusted like it was in the hospital

## 2015-02-03 NOTE — Telephone Encounter (Signed)
C/o a hiccup feeling.  Had this in the hospital and an adjustment was made to his device.  He will come in now per [name] to have this addressed

## 2015-02-06 ENCOUNTER — Emergency Department (HOSPITAL_COMMUNITY): Payer: Medicare Other

## 2015-02-06 ENCOUNTER — Inpatient Hospital Stay (HOSPITAL_COMMUNITY)
Admission: EM | Admit: 2015-02-06 | Discharge: 2015-02-08 | DRG: 261 | Disposition: A | Discharge status: Home / Self Care | Payer: Medicare Other | Attending: Interventional Cardiology | Admitting: Interventional Cardiology

## 2015-02-06 ENCOUNTER — Telehealth: Payer: Self-pay | Admitting: Cardiology

## 2015-02-06 ENCOUNTER — Encounter (HOSPITAL_COMMUNITY): Payer: Self-pay | Admitting: Emergency Medicine

## 2015-02-06 DIAGNOSIS — Z96643 Presence of artificial hip joint, bilateral: Secondary | ICD-10-CM | POA: Diagnosis present

## 2015-02-06 DIAGNOSIS — I340 Nonrheumatic mitral (valve) insufficiency: Secondary | ICD-10-CM | POA: Diagnosis present

## 2015-02-06 DIAGNOSIS — I252 Old myocardial infarction: Secondary | ICD-10-CM

## 2015-02-06 DIAGNOSIS — Z9049 Acquired absence of other specified parts of digestive tract: Secondary | ICD-10-CM

## 2015-02-06 DIAGNOSIS — L7632 Postprocedural hematoma of skin and subcutaneous tissue following other procedure: Secondary | ICD-10-CM | POA: Diagnosis present

## 2015-02-06 DIAGNOSIS — N183 Chronic kidney disease, stage 3 (moderate): Secondary | ICD-10-CM | POA: Diagnosis present

## 2015-02-06 DIAGNOSIS — Z8546 Personal history of malignant neoplasm of prostate: Secondary | ICD-10-CM

## 2015-02-06 DIAGNOSIS — Z960 Presence of urogenital implants: Secondary | ICD-10-CM | POA: Diagnosis present

## 2015-02-06 DIAGNOSIS — Z7902 Long term (current) use of antithrombotics/antiplatelets: Secondary | ICD-10-CM

## 2015-02-06 DIAGNOSIS — K219 Gastro-esophageal reflux disease without esophagitis: Secondary | ICD-10-CM | POA: Diagnosis present

## 2015-02-06 DIAGNOSIS — D329 Benign neoplasm of meninges, unspecified: Secondary | ICD-10-CM | POA: Diagnosis present

## 2015-02-06 DIAGNOSIS — I480 Paroxysmal atrial fibrillation: Secondary | ICD-10-CM | POA: Diagnosis present

## 2015-02-06 DIAGNOSIS — I951 Orthostatic hypotension: Secondary | ICD-10-CM | POA: Diagnosis not present

## 2015-02-06 DIAGNOSIS — J90 Pleural effusion, not elsewhere classified: Secondary | ICD-10-CM | POA: Diagnosis not present

## 2015-02-06 DIAGNOSIS — Z87891 Personal history of nicotine dependence: Secondary | ICD-10-CM

## 2015-02-06 DIAGNOSIS — I97191 Other postprocedural cardiac functional disturbances following other surgery: Secondary | ICD-10-CM | POA: Diagnosis not present

## 2015-02-06 DIAGNOSIS — I5022 Chronic systolic (congestive) heart failure: Secondary | ICD-10-CM | POA: Diagnosis not present

## 2015-02-06 DIAGNOSIS — F329 Major depressive disorder, single episode, unspecified: Secondary | ICD-10-CM | POA: Diagnosis present

## 2015-02-06 DIAGNOSIS — T82120A Displacement of cardiac electrode, initial encounter: Secondary | ICD-10-CM | POA: Diagnosis not present

## 2015-02-06 DIAGNOSIS — R001 Bradycardia, unspecified: Secondary | ICD-10-CM | POA: Diagnosis not present

## 2015-02-06 DIAGNOSIS — G4730 Sleep apnea, unspecified: Secondary | ICD-10-CM | POA: Diagnosis present

## 2015-02-06 DIAGNOSIS — I2510 Atherosclerotic heart disease of native coronary artery without angina pectoris: Secondary | ICD-10-CM | POA: Diagnosis present

## 2015-02-06 DIAGNOSIS — Z9119 Patient's noncompliance with other medical treatment and regimen: Secondary | ICD-10-CM

## 2015-02-06 DIAGNOSIS — I447 Left bundle-branch block, unspecified: Secondary | ICD-10-CM | POA: Diagnosis present

## 2015-02-06 DIAGNOSIS — Z96653 Presence of artificial knee joint, bilateral: Secondary | ICD-10-CM | POA: Diagnosis present

## 2015-02-06 DIAGNOSIS — F419 Anxiety disorder, unspecified: Secondary | ICD-10-CM | POA: Diagnosis present

## 2015-02-06 DIAGNOSIS — Y838 Other surgical procedures as the cause of abnormal reaction of the patient, or of later complication, without mention of misadventure at the time of the procedure: Secondary | ICD-10-CM | POA: Diagnosis present

## 2015-02-06 DIAGNOSIS — M545 Low back pain: Secondary | ICD-10-CM | POA: Diagnosis present

## 2015-02-06 DIAGNOSIS — G8929 Other chronic pain: Secondary | ICD-10-CM | POA: Diagnosis present

## 2015-02-06 DIAGNOSIS — R42 Dizziness and giddiness: Secondary | ICD-10-CM | POA: Diagnosis not present

## 2015-02-06 DIAGNOSIS — I255 Ischemic cardiomyopathy: Secondary | ICD-10-CM | POA: Diagnosis present

## 2015-02-06 DIAGNOSIS — Z888 Allergy status to other drugs, medicaments and biological substances status: Secondary | ICD-10-CM

## 2015-02-06 DIAGNOSIS — I130 Hypertensive heart and chronic kidney disease with heart failure and stage 1 through stage 4 chronic kidney disease, or unspecified chronic kidney disease: Secondary | ICD-10-CM | POA: Diagnosis not present

## 2015-02-06 DIAGNOSIS — Z9889 Other specified postprocedural states: Secondary | ICD-10-CM

## 2015-02-06 DIAGNOSIS — Z8249 Family history of ischemic heart disease and other diseases of the circulatory system: Secondary | ICD-10-CM

## 2015-02-06 DIAGNOSIS — Z79899 Other long term (current) drug therapy: Secondary | ICD-10-CM

## 2015-02-06 DIAGNOSIS — Z885 Allergy status to narcotic agent status: Secondary | ICD-10-CM

## 2015-02-06 DIAGNOSIS — H9193 Unspecified hearing loss, bilateral: Secondary | ICD-10-CM | POA: Diagnosis present

## 2015-02-06 DIAGNOSIS — Z959 Presence of cardiac and vascular implant and graft, unspecified: Secondary | ICD-10-CM

## 2015-02-06 DIAGNOSIS — N400 Enlarged prostate without lower urinary tract symptoms: Secondary | ICD-10-CM | POA: Diagnosis present

## 2015-02-06 DIAGNOSIS — Z955 Presence of coronary angioplasty implant and graft: Secondary | ICD-10-CM

## 2015-02-06 LAB — BASIC METABOLIC PANEL
ANION GAP: 8 (ref 5–15)
BUN: 20 mg/dL (ref 6–20)
CALCIUM: 8.9 mg/dL (ref 8.9–10.3)
CO2: 27 mmol/L (ref 22–32)
Chloride: 103 mmol/L (ref 101–111)
Creatinine, Ser: 1.41 mg/dL — ABNORMAL HIGH (ref 0.61–1.24)
GFR, EST AFRICAN AMERICAN: 50 mL/min — AB (ref >60)
GFR, EST NON AFRICAN AMERICAN: 44 mL/min — AB (ref >60)
Glucose, Bld: 137 mg/dL — ABNORMAL HIGH (ref 65–99)
Potassium: 3.9 mmol/L (ref 3.5–5.1)
Sodium: 138 mmol/L (ref 135–145)

## 2015-02-06 LAB — URINALYSIS, ROUTINE W REFLEX MICROSCOPIC
BILIRUBIN URINE: NEGATIVE
GLUCOSE, UA: NEGATIVE mg/dL
HGB URINE DIPSTICK: NEGATIVE
KETONES UR: NEGATIVE mg/dL
LEUKOCYTES UA: NEGATIVE
Nitrite: NEGATIVE
PH: 6.5 (ref 5.0–8.0)
Protein, ur: NEGATIVE mg/dL
Specific Gravity, Urine: 1.008 (ref 1.005–1.030)

## 2015-02-06 LAB — CBC WITH DIFFERENTIAL/PLATELET
BASOS ABS: 0 10*3/uL (ref 0.0–0.1)
BASOS PCT: 1 %
EOS PCT: 5 %
Eosinophils Absolute: 0.3 10*3/uL (ref 0.0–0.7)
HCT: 38.6 % — ABNORMAL LOW (ref 39.0–52.0)
Hemoglobin: 13 g/dL (ref 13.0–17.0)
Lymphocytes Relative: 26 %
Lymphs Abs: 1.6 10*3/uL (ref 0.7–4.0)
MCH: 32.2 pg (ref 26.0–34.0)
MCHC: 33.7 g/dL (ref 30.0–36.0)
MCV: 95.5 fL (ref 78.0–100.0)
MONO ABS: 0.6 10*3/uL (ref 0.1–1.0)
Monocytes Relative: 9 %
NEUTROS ABS: 3.7 10*3/uL (ref 1.7–7.7)
Neutrophils Relative %: 59 %
PLATELETS: 137 10*3/uL — AB (ref 150–400)
RBC: 4.04 MIL/uL — AB (ref 4.22–5.81)
RDW: 12.8 % (ref 11.5–15.5)
WBC: 6.2 10*3/uL (ref 4.0–10.5)

## 2015-02-06 LAB — LACTIC ACID, PLASMA
LACTIC ACID, VENOUS: 2.1 mmol/L — AB (ref 0.5–2.0)
Lactic Acid, Venous: 1.5 mmol/L (ref 0.5–2.0)

## 2015-02-06 LAB — TROPONIN I

## 2015-02-06 MED ORDER — NITROGLYCERIN 0.4 MG SL SUBL: 0.4000 mg | SUBLINGUAL_TABLET | SUBLINGUAL | Status: DC | PRN

## 2015-02-06 MED ORDER — ONDANSETRON HCL 4 MG/2ML IJ SOLN: 4.0000 mg | Freq: Four times a day (QID) | INTRAMUSCULAR | Status: DC | PRN

## 2015-02-06 MED ORDER — ACETAMINOPHEN 325 MG PO TABS
650.0000 mg | ORAL_TABLET | ORAL | Status: DC | PRN
  Administered 2015-02-07 – 2015-02-08 (×3): 650 mg via ORAL
  Filled 2015-02-06 (×3): qty 2

## 2015-02-06 MED ORDER — SODIUM CHLORIDE 0.9 % IV BOLUS (SEPSIS)
500.0000 mL | Freq: Once | INTRAVENOUS | Status: AC
  Administered 2015-02-06: 500 mL via INTRAVENOUS

## 2015-02-06 MED ORDER — SODIUM CHLORIDE 0.9 % IJ SOLN
3.0000 mL | Freq: Two times a day (BID) | INTRAMUSCULAR | Status: DC
  Administered 2015-02-07 – 2015-02-08 (×3): 3 mL via INTRAVENOUS

## 2015-02-06 MED ORDER — PANCRELIPASE (LIP-PROT-AMYL) 36000-114000 UNITS PO CPEP
36000.0000 [IU] | ORAL_CAPSULE | Freq: Three times a day (TID) | ORAL | Status: DC
  Administered 2015-02-07 – 2015-02-08 (×4): 36000 [IU] via ORAL
  Filled 2015-02-06 (×7): qty 1

## 2015-02-06 MED ORDER — LORAZEPAM 0.5 MG PO TABS
0.5000 mg | ORAL_TABLET | Freq: Every day | ORAL | Status: DC
  Administered 2015-02-06 – 2015-02-07 (×2): 0.5 mg via ORAL
  Filled 2015-02-06 (×2): qty 1

## 2015-02-06 MED ORDER — ENOXAPARIN SODIUM 40 MG/0.4ML ~~LOC~~ SOLN
40.0000 mg | SUBCUTANEOUS | Status: DC
  Administered 2015-02-06 – 2015-02-07 (×2): 40 mg via SUBCUTANEOUS
  Filled 2015-02-06 (×2): qty 0.4

## 2015-02-06 MED ORDER — CLOPIDOGREL BISULFATE 75 MG PO TABS
75.0000 mg | ORAL_TABLET | Freq: Every day | ORAL | Status: DC
  Administered 2015-02-08: 75 mg via ORAL
  Filled 2015-02-06: qty 1

## 2015-02-06 MED ORDER — FINASTERIDE 5 MG PO TABS
5.0000 mg | ORAL_TABLET | Freq: Every day | ORAL | Status: DC
  Administered 2015-02-07 – 2015-02-08 (×2): 5 mg via ORAL
  Filled 2015-02-06 (×2): qty 1

## 2015-02-06 MED ORDER — SODIUM CHLORIDE 0.9 % IJ SOLN: 3.0000 mL | INTRAMUSCULAR | Status: DC | PRN

## 2015-02-06 MED ORDER — DOCUSATE SODIUM 100 MG PO CAPS
100.0000 mg | ORAL_CAPSULE | Freq: Every day | ORAL | Status: DC
  Administered 2015-02-07 – 2015-02-08 (×2): 100 mg via ORAL
  Filled 2015-02-06 (×2): qty 1

## 2015-02-06 MED ORDER — AMIODARONE HCL 100 MG PO TABS
100.0000 mg | ORAL_TABLET | Freq: Every day | ORAL | Status: DC
Start: 2015-02-07 — End: 2015-02-08
  Administered 2015-02-07 – 2015-02-08 (×2): 100 mg via ORAL
  Filled 2015-02-06 (×2): qty 1

## 2015-02-06 MED ORDER — DORZOLAMIDE HCL-TIMOLOL MAL 2-0.5 % OP SOLN
1.0000 [drp] | Freq: Two times a day (BID) | OPHTHALMIC | Status: DC
Start: 2015-02-06 — End: 2015-02-08
  Administered 2015-02-06 – 2015-02-08 (×4): 1 [drp] via OPHTHALMIC
  Filled 2015-02-06: qty 10

## 2015-02-06 MED ORDER — CARVEDILOL 3.125 MG PO TABS
3.1250 mg | ORAL_TABLET | Freq: Two times a day (BID) | ORAL | Status: DC
  Administered 2015-02-07: 3.125 mg via ORAL
  Filled 2015-02-06: qty 1

## 2015-02-06 MED ORDER — LOSARTAN POTASSIUM 25 MG PO TABS
12.5000 mg | ORAL_TABLET | Freq: Every day | ORAL | Status: DC
  Administered 2015-02-07 – 2015-02-08 (×2): 12.5 mg via ORAL
  Filled 2015-02-06 (×2): qty 1

## 2015-02-06 MED ORDER — SODIUM CHLORIDE 0.9 % IV SOLN: 250.0000 mL | INTRAVENOUS | Status: DC | PRN

## 2015-02-06 NOTE — Telephone Encounter (Signed)
Care giver called pt has bp of 76 systolic and dizzy, i asked them to come to the hospital.

## 2015-02-06 NOTE — ED Notes (Signed)
Pt had pacemaker placed on Monday. Thursday he had the pacemaker adjusted. Family reports that since the adjustment things have been going down hill. Pt has c/o nausea, low BP, high heart rate, and severe dizziness.

## 2015-02-06 NOTE — ED Provider Notes (Signed)
CSN: [id number]     Arrival date & time 02/06/15  1407 History   First MD Initiated Contact with Patient 02/06/15 1422     Chief Complaint  Patient presents with  . Post-op Problem  . Hypotension    HPI  [name] is a 81 year old male with PMH of HTN, PAF (not on coumadin due to hx of GI bleed), CAD, systolic CHF, symptomatic bradycardia, s/p BiV pacemaker placement. Patient had pacemaker placed on 01/31/2015. He was experiencing diaphragmatic stimulation and pacemaker was adjusted on 12/8 with planned follow up with device clinic for full wound check on 12/19. Patient states that since pacemaker adjustment, he has been having lightheadedness/dizziness with room spinning sensation. He also complains of nausea and decreased appetite. His symptoms are worse with movement and occur on and off. His caretaker reports a blood pressure of 78/54 this morning with heart rate greater than 100. Patient denies chest pain other than some soreness at pacemaker site. He denies fever, palpitations, SOB, diaphoresis, vomiting, diarrhea, abdominal pain, dysuria, falls, or loss of consciousness.  Past Medical History  Diagnosis Date  . PAF (paroxysmal atrial fibrillation) (HCC)     not on coumadin due to hx of GI bleed.  . Mitral regurgitation     a. Mild-mod by echo 12/2014.  [name] GERD (gastroesophageal reflux disease)     occ. take prevacid  . Meningioma ([place]) right -sided w/ right VI palsy    followed by dr Gaynell Face  . Diverticulitis of colon with bleeding     s/p sigmoid resection '88  . History of GI diverticular bleed april 2012    transfused blood and resolved without surgical intervention  . Impotence, organic     s/p penile prosthesis 1990's  . BPH (benign prostatic hypertrophy) with urinary obstruction     s/p turp yrs ago  . Blood transfusion     "related to a surgery"  . Impaired hearing bilateral     only left hearing aid  . Bradycardia     a. Amio d/c'd 08/2013; brady arrest  08/2013 after PCI >>> recurrent AF >>> Amiodarone restarted. b. Pacemaker being considered in 11/2014.  . Cardiomyopathy ([place])     a. Echo (08/2013):  EF 30-35%, AS hypokinesis, Gr 1 diast dysfn, mild MR, mild LAE >>> b. improved EF 50-55% by echo 8/15. c. EF down again by echo 12/2014 to 30-35% but 51% by nuc.  [name] CAD (coronary artery disease)     a. s/p MI and prior PCI of LAD;  b. LHC (08/2013):  prox LAD 60-70%, mid LAD stents ok, ostial lesion at Dx jailed by stent, mild CFX and RCA disesase >>>  PCI (09/08/13):  rotational atherectomy + Promus DES to prox LAD  . Prostate cancer ([place]) 11/30/13    Gleason 8, volume 22.14 cc  . Anxiety   . Depression   . Hypertension   . Chronic systolic CHF (congestive heart failure) ([place])   . Myocardial infarction [facility]) 1980's- medical intervention    "so mild I didn't know I'd had it"  . Sleep apnea     non-compliant cpap  . DJD (degenerative joint disease) hips and knees    s/p bilateral total replacements  . DDD (degenerative disc disease)     chronic back pain  . Arthritis   . Chronic lower back pain   . Incomplete bladder emptying   . LBBB (left bundle branch block)   . CKD (chronic kidney disease), stage III  a. Per review of labs baseline Cr 1.1-1.3.   Past Surgical History  Procedure Laterality Date  . Cholecystectomy    . Total hip arthroplasty Right 03-25-08--  . Total hip arthroplasty Left 2005  . Total knee arthroplasty Right 2004  . Total knee arthroplasty Left 1997  . Inguinal hernia repair Bilateral   . Cataract extraction w/ intraocular lens  implant, bilateral Bilateral ~ 2000  . Appendectomy    . Transurethral resection of prostate  "years ago"  . Penile prosthesis implant  1990's  . Shoulder open rotator cuff repair Left   . Inner ear surgery Right yrs ago    "trying to get my hearing back  . Cardiac catheterization  2007    noncritical cad (results w/ chart)  . Coronary angioplasty with stent placement  08-03-08     drug-eluting stent x2 distal and mid lad  . Transurethral resection of prostate  01/08/2011    Procedure: TRANSURETHRAL RESECTION OF THE PROSTATE (TURP);  Surgeon: Franchot Gallo;  Location: [place];  Service: Urology;  Laterality: N/A;  GYRUS   . Prostate biopsy  11/30/13    Gleason 8, vol 22.14 cc  . Gamma knife radiation  2000    [facility] for meningioma, last eval 2013- no change  . Left heart catheterization with coronary angiogram N/A 09/04/2013    Procedure: LEFT HEART CATHETERIZATION WITH CORONARY ANGIOGRAM;  Surgeon: Jettie Booze, MD;  Location: [facility] CATH LAB;  Service: Cardiovascular;  Laterality: N/A;  . Percutaneous coronary rotoblator intervention (pci-r) N/A 09/08/2013    Procedure: PERCUTANEOUS CORONARY ROTOBLATOR INTERVENTION (PCI-R);  Surgeon: Jettie Booze, MD;  Location: [facility] CATH LAB;  Service: Cardiovascular;  Laterality: N/A;  . Revision total knee arthroplasty Right   . Total hip revision Right 3-4 times  . Closed reduction hip dislocation Right "several"  . Esophagogastroduodenoscopy N/A 02/11/2014    Procedure: ESOPHAGOGASTRODUODENOSCOPY (EGD);  Surgeon: Cleotis Nipper, MD;  Location: [facility] ENDOSCOPY;  Service: Endoscopy;  Laterality: N/A;  . Ep implantable device N/A 01/31/2015    Procedure: BiV Pacemaker Insertion CRT-P;  Surgeon: Evans Lance, MD;  Location: [place] CV LAB;  Service: Cardiovascular;  Laterality: N/A;   Family History  Problem Relation Age of Onset  . Heart disease Mother   . Heart attack Mother   . Cancer    . Emphysema Father   . Cancer Brother     liver cancer   Social History  Substance Use Topics  . Smoking status: Former Smoker -- 1.00 packs/day for 14 years    Types: Cigarettes    Quit date: 01/05/1959  . Smokeless tobacco: Never Used  . Alcohol Use: 0.0 oz/week     Comment: 02/09/2014 "I'm not drinking anymore"    Review of Systems  Constitutional: Negative for fever, chills and diaphoresis.   Eyes:       Blurry vision.  Respiratory: Negative for cough and shortness of breath.   Cardiovascular: Negative for chest pain and palpitations.  Gastrointestinal: Positive for nausea. Negative for vomiting, abdominal pain, diarrhea and constipation.  Genitourinary: Negative for dysuria.  Neurological: Positive for dizziness, light-headedness and headaches. Negative for tremors, speech difficulty, weakness and numbness.      Allergies  Protonix; Codeine; Prilosec; and Warfarin sodium  Home Medications   Prior to Admission medications   Medication Sig Start Date End Date Taking? Authorizing Provider  amiodarone (PACERONE) 200 MG tablet Take 0.5 tablets (100 mg total) by mouth daily. 01/18/15   Renee  Dyane Dustman, PA-C  amoxicillin (AMOXIL) 500 MG capsule Take 2,000 mg by mouth See admin instructions. Take 4 capsules (2000 mg) by mouth one hour prior to dental appointments    Historical Provider, MD  bicalutamide (CASODEX) 50 MG tablet Take 50 mg by mouth daily.  12/29/13   Historical Provider, MD  clopidogrel (PLAVIX) 75 MG tablet Take 1 tablet (75 mg total) by mouth daily. 08/27/14   Jettie Booze, MD  docusate sodium (COLACE) 100 MG capsule Take 100 mg by mouth daily.    Historical Provider, MD  dorzolamide-timolol (COSOPT) 22.3-6.8 MG/ML ophthalmic solution Place 1 drop into both eyes 2 (two) times daily.  11/12/13   Historical Provider, MD  doxazosin (CARDURA) 4 MG tablet Take 4 mg by mouth at bedtime.  06/03/14   Historical Provider, MD  finasteride (PROSCAR) 5 MG tablet Take 5 mg by mouth daily. 12/08/13   Historical Provider, MD  fluticasone (FLONASE) 50 MCG/ACT nasal spray Place 2 sprays into both nostrils as needed for allergies.  12/08/13   Historical Provider, MD  furosemide (LASIX) 40 MG tablet Take 0.5 tablets (20 mg total) by mouth every other day. 01/18/15   Renee Dyane Dustman, PA-C  lipase/protease/amylase (CREON) 36000 UNITS CPEP capsule Take 36,000 Units by mouth 3 (three)  times daily before meals.    Historical Provider, MD  LORazepam (ATIVAN) 0.5 MG tablet Take 0.5 mg by mouth every 8 (eight) hours as needed for anxiety.     Historical Provider, MD  losartan (COZAAR) 25 MG tablet Take 0.5 tablets (12.5 mg total) by mouth daily. 12/28/14   Burtis Junes, NP  metoprolol tartrate (LOPRESSOR) 25 MG tablet Take 1 tablet (25 mg total) by mouth 2 (two) times daily. 02/01/15   Renee Dyane Dustman, PA-C  Multiple Vitamin (MULTIVITAMIN WITH MINERALS) TABS tablet Take 1 tablet by mouth daily. Centrum Silver    Historical Provider, MD  nitroGLYCERIN (NITROSTAT) 0.4 MG SL tablet Place 1 tablet (0.4 mg total) under the tongue every 5 (five) minutes as needed for chest pain. 09/21/13   Liliane Shi, PA-C  Omega 3-Lutein-Zeaxanthin (ADVANCED EYE HEALTH PO) Take 1 tablet by mouth at bedtime.    Historical Provider, MD  ondansetron (ZOFRAN) 4 MG tablet Take 1 tablet by mouth every 6 (six) hours as needed for nausea.  01/12/15   Historical Provider, MD  OVER THE COUNTER MEDICATION Take 2 capsules by mouth 2 (two) times daily. Omega XL    Historical Provider, MD  potassium chloride SA (K-DUR,KLOR-CON) 20 MEQ tablet Take 1 tablet (20 mEq total) by mouth every other day. 01/18/15   Renee Dyane Dustman, PA-C  Probiotic Product (PROBIOTIC PO) Take 2 tablets by mouth daily.    Historical Provider, MD  psyllium (METAMUCIL) 58.6 % packet Take 1 packet by mouth daily.     Historical Provider, MD  traMADol (ULTRAM) 50 MG tablet Take 50 mg by mouth 3 (three) times daily. 12/23/14   Historical Provider, MD   BP 110/67 mmHg  Pulse 60  Temp(Src) 97.7 F (36.5 C) (Oral)  Resp 20  Ht 5\' 7"  (1.702 m)  Wt 84.908 kg  BMI 29.31 kg/m2  SpO2 93% Physical Exam  Constitutional: He is oriented to person, place, and time. He appears well-developed and well-nourished. No distress.  HENT:  Head: Normocephalic and atraumatic.  Eyes: EOM are normal. Pupils are equal, round, and reactive to light.   Cardiovascular: Normal rate, regular rhythm and intact distal pulses.   Pulmonary/Chest: Effort normal.  No respiratory distress. He has no wheezes.  Pacemaker left upper chest wall  Abdominal: Soft. Bowel sounds are normal. There is no tenderness.  Musculoskeletal: Normal range of motion. He exhibits no edema or tenderness.  Neurological: He is alert and oriented to person, place, and time. He displays normal reflexes. No cranial nerve deficit. Coordination normal.  Skin: Skin is warm.    ED Course  Procedures (including critical care time) Labs Review Labs Reviewed  CBC WITH DIFFERENTIAL/PLATELET  BASIC METABOLIC PANEL  TROPONIN I  LACTIC ACID, PLASMA  LACTIC ACID, PLASMA  URINALYSIS, ROUTINE W REFLEX MICROSCOPIC (NOT AT [facility])    Imaging Review No results found. I have personally reviewed and evaluated these images and lab results as part of my medical decision-making.   EKG Interpretation   Date/Time:  Sunday February 06 2015 14:12:48 EST Ventricular Rate:  60 PR Interval:  226 QRS Duration: 144 QT Interval:  508 QTC Calculation: 508 R Axis:   134 Text Interpretation:  AV dual-paced rhythm with prolonged AV conduction  Abnormal ECG AV dual-paced complexes no acute ischemia Confirmed by  Gerald Leitz ([id number]) on 02/06/2015 2:25:13 PM      MDM   Final diagnoses:  None   [name] is a 81 year old male with PMH of HTN, PAF (not on coumadin due to hx of GI bleed), CAD, systolic CHF, symptomatic bradycardia, s/p BiV pacemaker placement. Patient had pacemaker placed on 01/31/2015. He was experiencing diaphragmatic stimulation and pacemaker was adjusted on 12/8. Caretaker reports patient hypotensive this morning with BP 78/54 and tachycardic. Symptoms may be related to recent pacemaker adjustment, but there is concern for possible underlying infection. Patient afebrile and WBC returns normal at 6.2. CXR shows stable mild right basilar subsegmental atelectasis,  unchanged pacemaker position, and without any acute changes suggesting pneumonia. No UTI. EKG showing AV dual-paced rhythm without apparent acute ischemia and Troponin is negative. Vital signs stable while patient laying down, but is orthostatic when standing with drop in BP from [id number] to [id number] systolic. Patient given 0.5L NS bolus. Pacemaker interrogated, discussed with Cardiology who will admit and speak with EP about pacemaker.      Zada Finders, MD 02/06/15 [place], MD 02/09/15 [phone]

## 2015-02-06 NOTE — ED Notes (Signed)
Attempted to interrogate [employer] pacemaker.

## 2015-02-06 NOTE — ED Provider Notes (Signed)
I saw and evaluated the patient, reviewed the resident's note and I agree with the findings and plan.   Patient is a 81 year old male presenting with dizziness, chest discomfort, in the setting of recent hospilitization by cardiology and manipulation of his pacemaker. He says since pacemakers was changed a couple days ago in the hospital, he has had increasing symptoms. Wife and he report findings of hypotension and tachycardia on the home monitor.  Patient has device being interrogated. We will look for other cause of his symptoms such as pneumonia, UTI.    5:01 PM  His results came back normal- no pna, uti or white count. . Discussed with Dr. Domenic Polite. He suggest talking to electrophysiology first, if there is nothing further to add to call cardiology.  We will page electrophysiology.   Patient has orthostatic vital signs- BP drops 30 points systolic to 0000000 and HR goes up to 115.   Called cards to admit. (We tried to page electrophysciology but it comes up as the cards fellow)     EKG Interpretation   Date/Time:  Sunday February 06 2015 14:12:48 EST Ventricular Rate:  60 PR Interval:  226 QRS Duration: 144 QT Interval:  508 QTC Calculation: 508 R Axis:   134 Text Interpretation:  AV dual-paced rhythm with prolonged AV conduction  Abnormal ECG AV dual-paced complexes no acute ischemia Confirmed by  Gerald Leitz ([id number]) on 02/06/2015 2:25:13 PM        EMERGENCY DEPARTMENT [place] CARDIAC EXAM "Study: Limited Ultrasound of the heart and pericardium"  INDICATIONS:Tachycardia Multiple views of the heart and pericardium were obtained in real-time with a multi-frequency probe.  PERFORMED [id number]  IMAGES ARCHIVED?: Yes  FINDINGS: No pericardial effusion  LIMITATIONS:  Body habitus   INTERPRETATION: Cardiac activity present, no tamponade present.  Very difficult to see.     Chantalle Defilippo Julio Alm, MD 02/09/15 [id number]

## 2015-02-06 NOTE — ED Notes (Signed)
Attempted report 

## 2015-02-06 NOTE — ED Notes (Signed)
Interrogating [employer] pacemaker

## 2015-02-06 NOTE — H&P (Addendum)
EP: Dr. Lovena Le  CC: Dizziness, fluctuating HR  HPI: 81 yo man with CAD, h/o PCI to LAD with DES ni 08/2013, ischemic CM, recent LVEf 30-35% by echo last month, HTN, Paroxysmal AF 9on Amio) with high CHA2DS2-VASc score but not on anticoagulation due to GI bleeding, recently underwent [employer] CRT-P implant by Dr. Lovena Le on 01/31/15 for symptomatic bradycardia. On 12/8 device was reprogrammed due to diaphragmatic stimulation (LV vector changed from P4-Can to M2-Can. MTR changed from 120 to 100bpm. LV output changed from 3.0 to 1.0V).   Now he presents with dizziness and fluctuating HR from 60's to 100's/ He reports dizziness on taking upright position associated with nausea amd decreased appetite. His caretaker reported a blood pressure of 78/54 this morning with heart rate greater than 100. Patient denies chest pain other than some soreness at pacemaker site. He denies fever, palpitations, SOB, diaphoresis, vomiting, diarrhea, abdominal pain, dysuria, falls, or loss of consciousness.  In ER positive for orthostasis per ER provider Dr. Thomasene Lot. Lact 2.1, Cr 1.4. Normal WBC, no UTI by UA. Currently no CP, resting dyspnea, or distress. HR in ER on tele 60's to 90's. SBP 90s - 100's. He is getting 500 cc NS.    Review of Systems:  12 systems reviewed unremarkable except as noted in HPI    Past Medical History  Diagnosis Date  . PAF (paroxysmal atrial fibrillation) (HCC)     not on coumadin due to hx of GI bleed.  . Mitral regurgitation     a. Mild-mod by echo 12/2014.  [name] GERD (gastroesophageal reflux disease)     occ. take prevacid  . Meningioma ([place]) right -sided w/ right VI palsy    followed by dr Gaynell Face  . Diverticulitis of colon with bleeding     s/p sigmoid resection '88  . History of GI diverticular bleed april 2012    transfused blood and resolved without surgical intervention  . Impotence, organic     s/p penile prosthesis 1990's  . BPH (benign prostatic hypertrophy)  with urinary obstruction     s/p turp yrs ago  . Blood transfusion     "related to a surgery"  . Impaired hearing bilateral     only left hearing aid  . Bradycardia     a. Amio d/c'd 08/2013; brady arrest 08/2013 after PCI >>> recurrent AF >>> Amiodarone restarted. b. Pacemaker being considered in 11/2014.  . Cardiomyopathy ([place])     a. Echo (08/2013):  EF 30-35%, AS hypokinesis, Gr 1 diast dysfn, mild MR, mild LAE >>> b. improved EF 50-55% by echo 8/15. c. EF down again by echo 12/2014 to 30-35% but 51% by nuc.  [name] CAD (coronary artery disease)     a. s/p MI and prior PCI of LAD;  b. LHC (08/2013):  prox LAD 60-70%, mid LAD stents ok, ostial lesion at Dx jailed by stent, mild CFX and RCA disesase >>>  PCI (09/08/13):  rotational atherectomy + Promus DES to prox LAD  . Prostate cancer ([place]) 11/30/13    Gleason 8, volume 22.14 cc  . Anxiety   . Depression   . Hypertension   . Chronic systolic CHF (congestive heart failure) ([place])   . Myocardial infarction [facility]) 1980's- medical intervention    "so mild I didn't know I'd had it"  . Sleep apnea     non-compliant cpap  . DJD (degenerative joint disease) hips and knees    s/p bilateral total replacements  . DDD (degenerative  disc disease)     chronic back pain  . Arthritis   . Chronic lower back pain   . Incomplete bladder emptying   . LBBB (left bundle branch block)   . CKD (chronic kidney disease), stage III     a. Per review of labs baseline Cr 1.1-1.3.    No current facility-administered medications on file prior to encounter.   Current Outpatient Prescriptions on File Prior to Encounter  Medication Sig Dispense Refill  . amiodarone (PACERONE) 200 MG tablet Take 0.5 tablets (100 mg total) by mouth daily. 90 tablet 3  . amoxicillin (AMOXIL) 500 MG capsule Take 2,000 mg by mouth See admin instructions. Take 4 capsules (2000 mg) by mouth one hour prior to dental appointments    . bicalutamide (CASODEX) 50 MG tablet Take 50 mg by mouth  daily.   3  . clopidogrel (PLAVIX) 75 MG tablet Take 1 tablet (75 mg total) by mouth daily. 30 tablet 5  . docusate sodium (COLACE) 100 MG capsule Take 100 mg by mouth daily.    . dorzolamide-timolol (COSOPT) 22.3-6.8 MG/ML ophthalmic solution Place 1 drop into both eyes 2 (two) times daily.   2  . doxazosin (CARDURA) 4 MG tablet Take 4 mg by mouth at bedtime.   0  . finasteride (PROSCAR) 5 MG tablet Take 5 mg by mouth daily.    . fluticasone (FLONASE) 50 MCG/ACT nasal spray Place 2 sprays into both nostrils daily as needed for allergies.   1  . furosemide (LASIX) 40 MG tablet Take 0.5 tablets (20 mg total) by mouth every other day. 45 tablet 1  . lipase/protease/amylase (CREON) 36000 UNITS CPEP capsule Take 36,000 Units by mouth 3 (three) times daily before meals.    [name] LORazepam (ATIVAN) 0.5 MG tablet Take 0.5 mg by mouth every 8 (eight) hours as needed for anxiety.     [name] losartan (COZAAR) 25 MG tablet Take 0.5 tablets (12.5 mg total) by mouth daily. 30 tablet 3  . metoprolol tartrate (LOPRESSOR) 25 MG tablet Take 1 tablet (25 mg total) by mouth 2 (two) times daily. 60 tablet 3  . Multiple Vitamin (MULTIVITAMIN WITH MINERALS) TABS tablet Take 1 tablet by mouth daily. Centrum Silver    . nitroGLYCERIN (NITROSTAT) 0.4 MG SL tablet Place 1 tablet (0.4 mg total) under the tongue every 5 (five) minutes as needed for chest pain. 20 tablet 0  . Omega 3-Lutein-Zeaxanthin (ADVANCED EYE HEALTH PO) Take 1 tablet by mouth daily with lunch.     . ondansetron (ZOFRAN) 4 MG tablet Take 1 tablet by mouth every 6 (six) hours as needed for nausea.   0  . OVER THE COUNTER MEDICATION Take 2 capsules by mouth 2 (two) times daily. Omega XL    . potassium chloride SA (K-DUR,KLOR-CON) 20 MEQ tablet Take 1 tablet (20 mEq total) by mouth every other day. 90 tablet 1  . Probiotic Product (PROBIOTIC PO) Take 2 tablets by mouth daily.    . psyllium (METAMUCIL) 58.6 % packet Take 1 packet by mouth daily as needed (for  constipation).     . traMADol (ULTRAM) 50 MG tablet Take 50 mg by mouth 3 (three) times daily.       Allergies  Allergen Reactions  . Protonix [Pantoprazole Sodium] Other (See Comments)    Gi upset, insomnia  . Codeine Anxiety and Other (See Comments)    Insomnia/ hyper  . Prilosec [Omeprazole] Nausea And Vomiting and Other (See Comments)    GI upset,  insomnia   . Warfarin Sodium Anxiety and Other (See Comments)    Hx gi bleed     Social History   Social History  . Marital Status: Widowed    Spouse Name: N/A  . Number of Children: N/A  . Years of Education: N/A   Occupational History  . Not on file.   Social History Main Topics  . Smoking status: Former Smoker -- 1.00 packs/day for 14 years    Types: Cigarettes    Quit date: 01/05/1959  . Smokeless tobacco: Never Used  . Alcohol Use: 0.0 oz/week     Comment: 02/09/2014 "I'm not drinking anymore"  . Drug Use: No  . Sexual Activity: No   Other Topics Concern  . Not on file   Social History Narrative    Family History  Problem Relation Age of Onset  . Heart disease Mother   . Heart attack Mother   . Cancer    . Emphysema Father   . Cancer Brother     liver cancer    PHYSICAL EXAM: Filed Vitals:   02/06/15 1615 02/06/15 1637  BP: 113/73 121/79  Pulse: 98 97  Temp:    Resp: 18 14   General:  Well appearing. No respiratory difficulty HEENT: normal Neck: supple. no JVD. Carotids 2+ bilat; no bruits. No lymphadenopathy or thryomegaly appreciated. Cor: PMI nondisplaced. Iregular rate & rhythm. No rubs, gallops or murmurs. Pacemaker surgical site nontender, no bleeding or selling, steri strips appear clean.  Lungs: clear Abdomen: soft, nontender, nondistended. No hepatosplenomegaly. No bruits or masses. Good bowel sounds. Extremities: no cyanosis, clubbing, rash, edema Neuro: alert & oriented x 3, cranial nerves grossly intact. moves all 4 extremities w/o difficulty. Affect pleasant.  ECG: A sensed V  paced at 60 bpm -- possible BiV pacing by QRS morphology  Results for orders placed or performed during the hospital encounter of 02/06/15 (from the past 24 hour(s))  CBC with Differential     Status: Abnormal   Collection Time: 02/06/15  3:19 PM  Result Value Ref Range   WBC 6.2 4.0 - 10.5 K/uL   RBC 4.04 (L) 4.22 - 5.81 MIL/uL   Hemoglobin 13.0 13.0 - 17.0 g/dL   HCT 38.6 (L) 39.0 - 52.0 %   MCV 95.5 78.0 - 100.0 fL   MCH 32.2 26.0 - 34.0 pg   MCHC 33.7 30.0 - 36.0 g/dL   RDW 12.8 11.5 - 15.5 %   Platelets 137 (L) 150 - 400 K/uL   Neutrophils Relative % 59 %   Neutro Abs 3.7 1.7 - 7.7 K/uL   Lymphocytes Relative 26 %   Lymphs Abs 1.6 0.7 - 4.0 K/uL   Monocytes Relative 9 %   Monocytes Absolute 0.6 0.1 - 1.0 K/uL   Eosinophils Relative 5 %   Eosinophils Absolute 0.3 0.0 - 0.7 K/uL   Basophils Relative 1 %   Basophils Absolute 0.0 0.0 - 0.1 K/uL  Basic metabolic panel     Status: Abnormal   Collection Time: 02/06/15  3:19 PM  Result Value Ref Range   Sodium 138 135 - 145 mmol/L   Potassium 3.9 3.5 - 5.1 mmol/L   Chloride 103 101 - 111 mmol/L   CO2 27 22 - 32 mmol/L   Glucose, Bld 137 (H) 65 - 99 mg/dL   BUN 20 6 - 20 mg/dL   Creatinine, Ser 1.41 (H) 0.61 - 1.24 mg/dL   Calcium 8.9 8.9 - 10.3 mg/dL   GFR calc  non Af Amer 44 (L) >60 mL/min   GFR calc Af Amer 50 (L) >60 mL/min   Anion gap 8 5 - 15  Troponin I     Status: None   Collection Time: 02/06/15  3:19 PM  Result Value Ref Range   Troponin I <0.03 <0.031 ng/mL  Lactic acid, plasma     Status: Abnormal   Collection Time: 02/06/15  3:19 PM  Result Value Ref Range   Lactic Acid, Venous 2.1 (HH) 0.5 - 2.0 mmol/L  Lactic acid, plasma     Status: None   Collection Time: 02/06/15  4:09 PM  Result Value Ref Range   Lactic Acid, Venous 1.5 0.5 - 2.0 mmol/L   Dg Chest 2 View  02/06/2015  CLINICAL DATA:  Dizziness, hypertension. EXAM: CHEST  2 VIEW COMPARISON:  February 01, 2015. FINDINGS: No pneumothorax is noted.  Atherosclerosis of thoracic aorta is noted. Left-sided pacemaker is unchanged in position. Left lung is clear. Stable mild right basilar subsegmental atelectasis is noted. Possible mild right pleural effusion is noted. Bony thorax is intact. Stable cardiomediastinal silhouette. IMPRESSION: Stable mild right basilar subsegmental atelectasis. Mild right pleural effusion is noted as well. Stable chronic findings as described above. Electronically Signed   By: Marijo Conception, M.D.   On: 02/06/2015 15:48     ASSESSMENT:  1. Dizziness d/d BPPV, orthostasis 2. Orthostatic hypotension   3. Recent CRT-P implant 12/5 -- no more diaphragmatic stimulation  4. Ischemic CM, chronic systolic HF  - no signs or symptoms to suggest ACS or acute HF   PLAN/DISCUSSION:  - Admit to cardiology for obs - Complete Device check in am  - NS IVF cautiously  - Monitor vitals, labs - Hold Doxazosin 4 mg hs - Change Lorpessor 25 mg po bid to Coreg 3.125 mg po bid  - Continue Losartan 12.5 mg po qd   Please see orders for details   Wandra Mannan, MD Cardiology   [employer] rep on call Delorise Shiner [phone] interrogated the device. Atrial lead having sensing issues. CXR suggesting that A lead has dislodged and now at the floor of RA. She has reprogrammed device to VVI 40 bpm for now. His underlying sinus rate in 50's.   Wandra Mannan, MD

## 2015-02-07 ENCOUNTER — Encounter (HOSPITAL_COMMUNITY)
Admission: EM | Disposition: A | Discharge status: Home / Self Care | Payer: Medicare Other | Source: Home / Self Care | Attending: Interventional Cardiology

## 2015-02-07 DIAGNOSIS — F419 Anxiety disorder, unspecified: Secondary | ICD-10-CM | POA: Diagnosis present

## 2015-02-07 DIAGNOSIS — T82190A Other mechanical complication of cardiac electrode, initial encounter: Secondary | ICD-10-CM

## 2015-02-07 DIAGNOSIS — I951 Orthostatic hypotension: Secondary | ICD-10-CM | POA: Diagnosis present

## 2015-02-07 DIAGNOSIS — N400 Enlarged prostate without lower urinary tract symptoms: Secondary | ICD-10-CM | POA: Diagnosis present

## 2015-02-07 DIAGNOSIS — Z959 Presence of cardiac and vascular implant and graft, unspecified: Secondary | ICD-10-CM | POA: Diagnosis not present

## 2015-02-07 DIAGNOSIS — G8929 Other chronic pain: Secondary | ICD-10-CM | POA: Diagnosis present

## 2015-02-07 DIAGNOSIS — I97191 Other postprocedural cardiac functional disturbances following other surgery: Secondary | ICD-10-CM | POA: Diagnosis not present

## 2015-02-07 DIAGNOSIS — T82120A Displacement of cardiac electrode, initial encounter: Secondary | ICD-10-CM | POA: Diagnosis not present

## 2015-02-07 DIAGNOSIS — R001 Bradycardia, unspecified: Secondary | ICD-10-CM | POA: Diagnosis not present

## 2015-02-07 DIAGNOSIS — K219 Gastro-esophageal reflux disease without esophagitis: Secondary | ICD-10-CM | POA: Diagnosis present

## 2015-02-07 DIAGNOSIS — N183 Chronic kidney disease, stage 3 (moderate): Secondary | ICD-10-CM | POA: Diagnosis present

## 2015-02-07 DIAGNOSIS — D329 Benign neoplasm of meninges, unspecified: Secondary | ICD-10-CM | POA: Diagnosis present

## 2015-02-07 DIAGNOSIS — I447 Left bundle-branch block, unspecified: Secondary | ICD-10-CM | POA: Diagnosis present

## 2015-02-07 DIAGNOSIS — F329 Major depressive disorder, single episode, unspecified: Secondary | ICD-10-CM | POA: Diagnosis present

## 2015-02-07 DIAGNOSIS — I252 Old myocardial infarction: Secondary | ICD-10-CM | POA: Diagnosis not present

## 2015-02-07 DIAGNOSIS — G4730 Sleep apnea, unspecified: Secondary | ICD-10-CM | POA: Diagnosis present

## 2015-02-07 DIAGNOSIS — Z8546 Personal history of malignant neoplasm of prostate: Secondary | ICD-10-CM | POA: Diagnosis not present

## 2015-02-07 DIAGNOSIS — I5022 Chronic systolic (congestive) heart failure: Secondary | ICD-10-CM | POA: Diagnosis present

## 2015-02-07 DIAGNOSIS — I255 Ischemic cardiomyopathy: Secondary | ICD-10-CM | POA: Diagnosis present

## 2015-02-07 DIAGNOSIS — Z9889 Other specified postprocedural states: Secondary | ICD-10-CM | POA: Diagnosis not present

## 2015-02-07 DIAGNOSIS — T82120D Displacement of cardiac electrode, subsequent encounter: Secondary | ICD-10-CM | POA: Diagnosis not present

## 2015-02-07 DIAGNOSIS — Z9049 Acquired absence of other specified parts of digestive tract: Secondary | ICD-10-CM | POA: Diagnosis not present

## 2015-02-07 DIAGNOSIS — Z87891 Personal history of nicotine dependence: Secondary | ICD-10-CM | POA: Diagnosis not present

## 2015-02-07 DIAGNOSIS — I340 Nonrheumatic mitral (valve) insufficiency: Secondary | ICD-10-CM | POA: Diagnosis present

## 2015-02-07 DIAGNOSIS — Z960 Presence of urogenital implants: Secondary | ICD-10-CM | POA: Diagnosis present

## 2015-02-07 DIAGNOSIS — Z888 Allergy status to other drugs, medicaments and biological substances status: Secondary | ICD-10-CM | POA: Diagnosis not present

## 2015-02-07 DIAGNOSIS — H9193 Unspecified hearing loss, bilateral: Secondary | ICD-10-CM | POA: Diagnosis present

## 2015-02-07 DIAGNOSIS — I2510 Atherosclerotic heart disease of native coronary artery without angina pectoris: Secondary | ICD-10-CM | POA: Diagnosis present

## 2015-02-07 DIAGNOSIS — I480 Paroxysmal atrial fibrillation: Secondary | ICD-10-CM | POA: Diagnosis present

## 2015-02-07 DIAGNOSIS — Z885 Allergy status to narcotic agent status: Secondary | ICD-10-CM | POA: Diagnosis not present

## 2015-02-07 DIAGNOSIS — Z955 Presence of coronary angioplasty implant and graft: Secondary | ICD-10-CM | POA: Diagnosis not present

## 2015-02-07 DIAGNOSIS — Z79899 Other long term (current) drug therapy: Secondary | ICD-10-CM | POA: Diagnosis not present

## 2015-02-07 DIAGNOSIS — Z9119 Patient's noncompliance with other medical treatment and regimen: Secondary | ICD-10-CM | POA: Diagnosis not present

## 2015-02-07 DIAGNOSIS — T82897A Other specified complication of cardiac prosthetic devices, implants and grafts, initial encounter: Secondary | ICD-10-CM | POA: Diagnosis not present

## 2015-02-07 DIAGNOSIS — Z96653 Presence of artificial knee joint, bilateral: Secondary | ICD-10-CM | POA: Diagnosis present

## 2015-02-07 DIAGNOSIS — L7632 Postprocedural hematoma of skin and subcutaneous tissue following other procedure: Secondary | ICD-10-CM | POA: Diagnosis present

## 2015-02-07 DIAGNOSIS — Y838 Other surgical procedures as the cause of abnormal reaction of the patient, or of later complication, without mention of misadventure at the time of the procedure: Secondary | ICD-10-CM | POA: Diagnosis present

## 2015-02-07 DIAGNOSIS — I130 Hypertensive heart and chronic kidney disease with heart failure and stage 1 through stage 4 chronic kidney disease, or unspecified chronic kidney disease: Secondary | ICD-10-CM | POA: Diagnosis not present

## 2015-02-07 DIAGNOSIS — M545 Low back pain: Secondary | ICD-10-CM | POA: Diagnosis present

## 2015-02-07 DIAGNOSIS — Z96643 Presence of artificial hip joint, bilateral: Secondary | ICD-10-CM | POA: Diagnosis present

## 2015-02-07 DIAGNOSIS — Z7902 Long term (current) use of antithrombotics/antiplatelets: Secondary | ICD-10-CM | POA: Diagnosis not present

## 2015-02-07 DIAGNOSIS — Z8249 Family history of ischemic heart disease and other diseases of the circulatory system: Secondary | ICD-10-CM | POA: Diagnosis not present

## 2015-02-07 HISTORY — PX: EP IMPLANTABLE DEVICE: SHX172B

## 2015-02-07 LAB — BASIC METABOLIC PANEL
Anion gap: 8 (ref 5–15)
BUN: 22 mg/dL — ABNORMAL HIGH (ref 6–20)
CALCIUM: 8.9 mg/dL (ref 8.9–10.3)
CO2: 27 mmol/L (ref 22–32)
CREATININE: 1.42 mg/dL — AB (ref 0.61–1.24)
Chloride: 101 mmol/L (ref 101–111)
GFR, EST AFRICAN AMERICAN: 50 mL/min — AB (ref >60)
GFR, EST NON AFRICAN AMERICAN: 43 mL/min — AB (ref >60)
Glucose, Bld: 100 mg/dL — ABNORMAL HIGH (ref 65–99)
Potassium: 3.8 mmol/L (ref 3.5–5.1)
SODIUM: 136 mmol/L (ref 135–145)

## 2015-02-07 LAB — GLUCOSE, CAPILLARY: GLUCOSE-CAPILLARY: 127 mg/dL — AB (ref 65–99)

## 2015-02-07 SURGERY — LEAD REVISION/REPAIR

## 2015-02-07 MED ORDER — FENTANYL CITRATE (PF) 100 MCG/2ML IJ SOLN
INTRAMUSCULAR | Status: AC
  Filled 2015-02-07: qty 2

## 2015-02-07 MED ORDER — LORAZEPAM 0.5 MG PO TABS: 0.5000 mg | ORAL_TABLET | Freq: Every evening | ORAL | Status: DC | PRN

## 2015-02-07 MED ORDER — SODIUM CHLORIDE 0.9 % IR SOLN
Status: AC
  Filled 2015-02-07: qty 2

## 2015-02-07 MED ORDER — SODIUM CHLORIDE 0.9 % IV SOLN
INTRAVENOUS | Status: AC
  Administered 2015-02-07: 13:00:00 via INTRAVENOUS

## 2015-02-07 MED ORDER — ONDANSETRON HCL 4 MG/2ML IJ SOLN: 4.0000 mg | Freq: Four times a day (QID) | INTRAMUSCULAR | Status: DC | PRN

## 2015-02-07 MED ORDER — CEFAZOLIN SODIUM 1-5 GM-% IV SOLN
1.0000 g | Freq: Four times a day (QID) | INTRAVENOUS | Status: AC
  Administered 2015-02-07 – 2015-02-08 (×3): 1 g via INTRAVENOUS
  Filled 2015-02-07 (×3): qty 50

## 2015-02-07 MED ORDER — FENTANYL CITRATE (PF) 100 MCG/2ML IJ SOLN
INTRAMUSCULAR | Status: DC | PRN
  Administered 2015-02-07: 25 ug via INTRAVENOUS

## 2015-02-07 MED ORDER — CEFAZOLIN SODIUM-DEXTROSE 2-3 GM-% IV SOLR
2.0000 g | INTRAVENOUS | Status: AC
  Administered 2015-02-07: 2 g via INTRAVENOUS

## 2015-02-07 MED ORDER — MIDAZOLAM HCL 5 MG/5ML IJ SOLN
INTRAMUSCULAR | Status: DC | PRN
  Administered 2015-02-07 (×2): 1 mg via INTRAVENOUS

## 2015-02-07 MED ORDER — ACETAMINOPHEN 325 MG PO TABS: 325.0000 mg | ORAL_TABLET | ORAL | Status: DC | PRN

## 2015-02-07 MED ORDER — SODIUM CHLORIDE 0.9 % IV SOLN: INTRAVENOUS | Status: DC

## 2015-02-07 MED ORDER — SODIUM CHLORIDE 0.9 % IR SOLN
80.0000 mg | Status: AC
  Administered 2015-02-07: 80 mg

## 2015-02-07 MED ORDER — CHLORHEXIDINE GLUCONATE 4 % EX LIQD: 60.0000 mL | Freq: Once | CUTANEOUS | Status: DC

## 2015-02-07 MED ORDER — CARVEDILOL 3.125 MG PO TABS
3.1250 mg | ORAL_TABLET | Freq: Two times a day (BID) | ORAL | Status: DC
  Administered 2015-02-08: 3.125 mg via ORAL
  Filled 2015-02-07: qty 1

## 2015-02-07 MED ORDER — HEPARIN (PORCINE) IN NACL 2-0.9 UNIT/ML-% IJ SOLN
INTRAMUSCULAR | Status: AC
  Filled 2015-02-07: qty 500

## 2015-02-07 MED ORDER — CEFAZOLIN SODIUM-DEXTROSE 2-3 GM-% IV SOLR
INTRAVENOUS | Status: AC
  Filled 2015-02-07: qty 50

## 2015-02-07 MED ORDER — LIDOCAINE HCL (PF) 1 % IJ SOLN
INTRAMUSCULAR | Status: DC | PRN
  Administered 2015-02-07: 53 mL

## 2015-02-07 MED ORDER — MIDAZOLAM HCL 5 MG/5ML IJ SOLN
INTRAMUSCULAR | Status: AC
  Filled 2015-02-07: qty 5

## 2015-02-07 MED ORDER — GENTAMICIN SULFATE 40 MG/ML IJ SOLN
INTRAMUSCULAR | Status: AC
  Filled 2015-02-07: qty 2

## 2015-02-07 MED ORDER — HEPARIN (PORCINE) IN NACL 2-0.9 UNIT/ML-% IJ SOLN
INTRAMUSCULAR | Status: DC | PRN
  Administered 2015-02-07: 500 mL

## 2015-02-07 MED ORDER — LIDOCAINE HCL (PF) 1 % IJ SOLN
INTRAMUSCULAR | Status: AC
  Filled 2015-02-07: qty 60

## 2015-02-07 SURGICAL SUPPLY — 4 items
CABLE SURGICAL S-101-97-12 (CABLE) ×2 IMPLANT
HEMOSTAT SURGICEL 2X4 FIBR (HEMOSTASIS) ×3 IMPLANT
PAD DEFIB LIFELINK (PAD) ×3 IMPLANT
TRAY PACEMAKER INSERTION (PACKS) ×2 IMPLANT

## 2015-02-07 NOTE — Progress Notes (Signed)
Orthopedic Tech Progress Note Patient Details:  [name] 1926/03/25 [id number]  Ortho Devices Type of Ortho Device: Arm sling   [place] Pink 02/07/2015, 5:15 PM

## 2015-02-07 NOTE — Consult Note (Signed)
ELECTROPHYSIOLOGY CONSULT NOTE    Patient ID: CARDEN BYCE MRN: [id number], DOB/AGE: [birth year] 79 y.o.  Admit date: 02/06/2015 Date of Consult: 02/07/2015  Primary Physician: Velna Hatchet, MD Primary Cardiologist: Irish Lack Electrophysiologist: Lovena Le  Reason for Consultation: atrial lead dislodgement   HPI:  THAILAND SALZBERG is a 81 y.o. male with a past medical history significant for paroxysmal atrial fibrillation, hypertension, coronary artery disease, ischemic cardiomyopathy, symptomatic bradycardia, and congestive heart failure s/p CRTP implant 01/31/2015. He has had problems with diaphragmatic stimulation post device implant which finally resolved with reprogramming.  This weekend, he developed fatigue and heart rate variability.  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 EP has been asked to evaluate for treatment options.   He currently states that he does not have shortness of breath at rest or chest pain. He has not had recent fevers, chills, LE edema, nausea or vomiting.    Past Medical History  Diagnosis Date  . PAF (paroxysmal atrial fibrillation) (HCC)     not on coumadin due to hx of GI bleed.  . Mitral regurgitation     a. Mild-mod by echo 12/2014.  [name] GERD (gastroesophageal reflux disease)     occ. take prevacid  . Meningioma ([place]) right -sided w/ right VI palsy    followed by dr Gaynell Face  . Diverticulitis of colon with bleeding     s/p sigmoid resection '88  . History of GI diverticular bleed april 2012    transfused blood and resolved without surgical intervention  . Impotence, organic     s/p penile prosthesis 1990's  . BPH (benign prostatic hypertrophy) with urinary obstruction     s/p turp yrs ago  . Blood transfusion     "related to a surgery"  . Impaired hearing bilateral     only left hearing aid  . Bradycardia     a. Amio d/c'd 08/2013; brady arrest 08/2013 after PCI >>> recurrent  AF >>> Amiodarone restarted. b. Pacemaker being considered in 11/2014.  . Cardiomyopathy ([place])     a. Echo (08/2013):  EF 30-35%, AS hypokinesis, Gr 1 diast dysfn, mild MR, mild LAE >>> b. improved EF 50-55% by echo 8/15. c. EF down again by echo 12/2014 to 30-35% but 51% by nuc.  [name] CAD (coronary artery disease)     a. s/p MI and prior PCI of LAD;  b. LHC (08/2013):  prox LAD 60-70%, mid LAD stents ok, ostial lesion at Dx jailed by stent, mild CFX and RCA disesase >>>  PCI (09/08/13):  rotational atherectomy + Promus DES to prox LAD  . Prostate cancer ([place]) 11/30/13    Gleason 8, volume 22.14 cc  . Anxiety   . Depression   . Hypertension   . Chronic systolic CHF (congestive heart failure) ([place])   . Myocardial infarction [facility]) 1980's- medical intervention    "so mild I didn't know I'd had it"  . Sleep apnea     non-compliant cpap  . DJD (degenerative joint disease) hips and knees    s/p bilateral total replacements  . DDD (degenerative disc disease)     chronic back pain  . Arthritis   . Chronic lower back pain   . Incomplete bladder emptying   . LBBB (left bundle branch block)   . CKD (chronic kidney disease), stage III     a. Per review of labs baseline Cr 1.1-1.3.     Surgical History:  Past  Surgical History  Procedure Laterality Date  . Cholecystectomy    . Total hip arthroplasty Right 03-25-08--  . Total hip arthroplasty Left 2005  . Total knee arthroplasty Right 2004  . Total knee arthroplasty Left 1997  . Inguinal hernia repair Bilateral   . Cataract extraction w/ intraocular lens  implant, bilateral Bilateral ~ 2000  . Appendectomy    . Transurethral resection of prostate  "years ago"  . Penile prosthesis implant  1990's  . Shoulder open rotator cuff repair Left   . Inner ear surgery Right yrs ago    "trying to get my hearing back  . Cardiac catheterization  2007    noncritical cad (results w/ chart)  . Coronary angioplasty with stent placement  08-03-08     drug-eluting stent x2 distal and mid lad  . Transurethral resection of prostate  01/08/2011    Procedure: TRANSURETHRAL RESECTION OF THE PROSTATE (TURP);  Surgeon: Franchot Gallo;  Location: [place];  Service: Urology;  Laterality: N/A;  GYRUS   . Prostate biopsy  11/30/13    Gleason 8, vol 22.14 cc  . Gamma knife radiation  2000    [facility] for [facility]ioma, last eval 2013- no change  . Left heart catheterization with coronary angiogram N/A 09/04/2013    Procedure: LEFT HEART CATHETERIZATION WITH CORONARY ANGIOGRAM;  Surgeon: Jettie Booze, MD;  Location: [facility] CATH LAB;  Service: Cardiovascular;  Laterality: N/A;  . Percutaneous coronary rotoblator intervention (pci-r) N/A 09/08/2013    Procedure: PERCUTANEOUS CORONARY ROTOBLATOR INTERVENTION (PCI-R);  Surgeon: Jettie Booze, MD;  Location: [facility] CATH LAB;  Service: Cardiovascular;  Laterality: N/A;  . Revision total knee arthroplasty Right   . Total hip revision Right 3-4 times  . Closed reduction hip dislocation Right "several"  . Esophagogastroduodenoscopy N/A 02/11/2014    Procedure: ESOPHAGOGASTRODUODENOSCOPY (EGD);  Surgeon: Cleotis Nipper, MD;  Location: [facility] ENDOSCOPY;  Service: Endoscopy;  Laterality: N/A;  . Ep implantable device N/A 01/31/2015    Procedure: BiV Pacemaker Insertion CRT-P;  Surgeon: Evans Lance, MD;  Location: [place] CV LAB;  Service: Cardiovascular;  Laterality: N/A;     Prescriptions prior to admission  Medication Sig Dispense Refill Last Dose  . amiodarone (PACERONE) 200 MG tablet Take 0.5 tablets (100 mg total) by mouth daily. 90 tablet 3 02/06/2015 at Unknown time  . amoxicillin (AMOXIL) 500 MG capsule Take 2,000 mg by mouth See admin instructions. Take 4 capsules (2000 mg) by mouth one hour prior to dental appointments   couple months ago  . bicalutamide (CASODEX) 50 MG tablet Take 50 mg by mouth daily.   3 02/06/2015 at Unknown time  . clopidogrel (PLAVIX) 75 MG tablet Take 1  tablet (75 mg total) by mouth daily. 30 tablet 5 02/06/2015 at Unknown time  . docusate sodium (COLACE) 100 MG capsule Take 100 mg by mouth daily.   02/06/2015 at Unknown time  . dorzolamide-timolol (COSOPT) 22.3-6.8 MG/ML ophthalmic solution Place 1 drop into both eyes 2 (two) times daily.   2 02/06/2015 at Unknown time  . doxazosin (CARDURA) 4 MG tablet Take 4 mg by mouth at bedtime.   0 02/05/2015 at Unknown time  . finasteride (PROSCAR) 5 MG tablet Take 5 mg by mouth daily.   02/06/2015 at Unknown time  . fluticasone (FLONASE) 50 MCG/ACT nasal spray Place 2 sprays into both nostrils daily as needed for allergies.   1 unknown  . furosemide (LASIX) 40 MG tablet Take 0.5 tablets (20 mg  total) by mouth every other day. 45 tablet 1 02/06/2015 at Unknown time  . lipase/protease/amylase (CREON) 36000 UNITS CPEP capsule Take 36,000 Units by mouth 3 (three) times daily before meals.   02/06/2015 at am  . LORazepam (ATIVAN) 0.5 MG tablet Take 0.5 mg by mouth every 8 (eight) hours as needed for anxiety.    02/05/2015 at Unknown time  . losartan (COZAAR) 25 MG tablet Take 0.5 tablets (12.5 mg total) by mouth daily. 30 tablet 3 02/06/2015 at Unknown time  . Multiple Vitamin (MULTIVITAMIN WITH MINERALS) TABS tablet Take 1 tablet by mouth daily. Centrum Silver   02/06/2015 at Unknown time  . nitroGLYCERIN (NITROSTAT) 0.4 MG SL tablet Place 1 tablet (0.4 mg total) under the tongue every 5 (five) minutes as needed for chest pain. 20 tablet 0 unknown  . Omega 3-Lutein-Zeaxanthin (ADVANCED EYE HEALTH PO) Take 1 tablet by mouth daily with lunch.    02/05/2015 at Unknown time  . ondansetron (ZOFRAN) 4 MG tablet Take 1 tablet by mouth every 6 (six) hours as needed for nausea.   0 02/06/2015 at 1100  . OVER THE COUNTER MEDICATION Take 2 capsules by mouth 2 (two) times daily. Omega XL   02/06/2015 at am  . potassium chloride SA (K-DUR,KLOR-CON) 20 MEQ tablet Take 1 tablet (20 mEq total) by mouth every other day. 90  tablet 1 02/06/2015 at Unknown time  . Probiotic Product (PROBIOTIC PO) Take 2 tablets by mouth daily.   02/06/2015 at Unknown time  . psyllium (METAMUCIL) 58.6 % packet Take 1 packet by mouth daily as needed (for constipation).    unknown  . traMADol (ULTRAM) 50 MG tablet Take 50 mg by mouth 3 (three) times daily.   02/06/2015 at am    Inpatient Medications:  . amiodarone  100 mg Oral Daily  . carvedilol  3.125 mg Oral BID WC  . clopidogrel  75 mg Oral Daily  . docusate sodium  100 mg Oral Daily  . dorzolamide-timolol  1 drop Both Eyes BID  . enoxaparin (LOVENOX) injection  40 mg Subcutaneous Q24H  . finasteride  5 mg Oral Daily  . lipase/protease/amylase  36,000 Units Oral TID AC  . LORazepam  0.5 mg Oral QHS  . losartan  12.5 mg Oral Daily  . sodium chloride  3 mL Intravenous Q12H    Allergies:  Allergies  Allergen Reactions  . Protonix [Pantoprazole Sodium] Other (See Comments)    Gi upset, insomnia  . Codeine Anxiety and Other (See Comments)    Insomnia/ hyper  . Prilosec [Omeprazole] Nausea And Vomiting and Other (See Comments)    GI upset, insomnia   . Warfarin Sodium Anxiety and Other (See Comments)    Hx gi bleed     Social History   Social History  . Marital Status: Widowed    Spouse Name: N/A  . Number of Children: N/A  . Years of Education: N/A   Occupational History  . Not on file.   Social History Main Topics  . Smoking status: Former Smoker -- 1.00 packs/day for 14 years    Types: Cigarettes    Quit date: 01/05/1959  . Smokeless tobacco: Never Used  . Alcohol Use: 0.0 oz/week     Comment: 02/09/2014 "I'm not drinking anymore"  . Drug Use: No  . Sexual Activity: No   Other Topics Concern  . Not on file   Social History Narrative     Family History  Problem Relation Age of Onset  .  Heart disease Mother   . Heart attack Mother   . Cancer    . Emphysema Father   . Cancer Brother     liver cancer     Review of Systems: All other  systems reviewed and are otherwise negative except as noted above.  Physical Exam: Filed Vitals:   02/07/15 0605 02/07/15 0607 02/07/15 0733 02/07/15 0744  BP:   82/33 117/55  Pulse:   45 46  Temp:   98 F (36.7 C)   TempSrc:   Oral   Resp:   16   Height:      Weight:  184 lb 3.2 oz (83.553 kg)    SpO2: 98%  96%     GEN- The patient is elderly appearing, alert and oriented x 3 today.   HEENT: normocephalic, atraumatic; sclera clear, conjunctiva pink; hearing intact; oropharynx clear; neck supple  Lungs- Clear to ausculation bilaterally, normal work of breathing.  No wheezes, rales, rhonchi Heart- Irregular rate and rhythm, no murmurs, rubs or gallops  GI- soft, non-tender, non-distended, bowel sounds present Extremities- no clubbing, cyanosis, or edema; DP/PT/radial pulses 1+ bilaterally MS- no significant deformity or atrophy Skin- warm and dry, no rash or lesion Psych- euthymic mood, full affect Neuro- strength and sensation are intact  Labs:   Lab Results  Component Value Date   WBC 6.2 02/06/2015   HGB 13.0 02/06/2015   HCT 38.6* 02/06/2015   MCV 95.5 02/06/2015   PLT 137* 02/06/2015    Recent Labs Lab 02/07/15 0446  NA 136  K 3.8  CL 101  CO2 27  BUN 22*  CREATININE 1.42*  CALCIUM 8.9  GLUCOSE 100*      Radiology/Studies: Dg Chest 2 View 02/06/2015  CLINICAL DATA:  Dizziness, hypertension. EXAM: CHEST  2 VIEW COMPARISON:  February 01, 2015. FINDINGS: No pneumothorax is noted. Atherosclerosis of thoracic aorta is noted. Left-sided pacemaker is unchanged in position. Left lung is clear. Stable mild right basilar subsegmental atelectasis is noted. Possible mild right pleural effusion is noted. Bony thorax is intact. Stable cardiomediastinal silhouette. IMPRESSION: Stable mild right basilar subsegmental atelectasis. Mild right pleural effusion is noted as well. Stable chronic findings as described above. Electronically Signed   By: Marijo Conception, M.D.   On:  02/06/2015 15:48    EKG:AV pacing with atrial non capture  TELEMETRY: sinus bradycardia  DEVICE HISTORY: STJ CRTP 01/31/2015  Assessment/Plan: 1.  Atrial lead dislodgement The patient presented with atrial lead dislodgement and pacemaker syndrome.  He did have clinical benefit from [facility] after implant. Risks, benefits to lead revision were reviewed with the patient and his family who wishes to proceed. Will plan with Dr Caryl Comes later this afternoon.  2.  Paroxysmal atrial fibrillation Maintaining SR currently Not felt to be a candidate for [facility] with previous GI bleed CHADS2VASC is at least 5  3.  HTN Stable No change required today  4.  CAD/ICM No recent ischemic symptoms   Signed, Chanetta Marshall, NP 02/07/2015 8:20 AM   Pt seen and examined Note addended as necessary  Atrial lead disolodgement w pacemaker syndrome like symptoms--will need repositioning  Diaphragmatic stimulation was resolved with reprogramming    IMP Atrial lead dislodgement, acutely   PAF  High CHADSVASC score    Will  Need to reposition lead   May need to remove and replace Risks and benefits reviewed   Would strongly suggest reconsideration of anticoagulation decision re GI bleeding if he has been stable off of blood  thinners

## 2015-02-08 ENCOUNTER — Other Ambulatory Visit: Payer: Self-pay | Admitting: Nurse Practitioner

## 2015-02-08 ENCOUNTER — Inpatient Hospital Stay (HOSPITAL_COMMUNITY): Payer: Medicare Other

## 2015-02-08 ENCOUNTER — Other Ambulatory Visit: Payer: Self-pay

## 2015-02-08 ENCOUNTER — Encounter (HOSPITAL_COMMUNITY): Payer: Self-pay | Admitting: Internal Medicine

## 2015-02-08 DIAGNOSIS — T82120A Displacement of cardiac electrode, initial encounter: Principal | ICD-10-CM

## 2015-02-08 LAB — BASIC METABOLIC PANEL
Anion gap: 7 (ref 5–15)
BUN: 16 mg/dL (ref 6–20)
CALCIUM: 8.9 mg/dL (ref 8.9–10.3)
CO2: 28 mmol/L (ref 22–32)
CREATININE: 1.33 mg/dL — AB (ref 0.61–1.24)
Chloride: 102 mmol/L (ref 101–111)
GFR calc Af Amer: 54 mL/min — ABNORMAL LOW (ref >60)
GFR, EST NON AFRICAN AMERICAN: 47 mL/min — AB (ref >60)
Glucose, Bld: 94 mg/dL (ref 65–99)
POTASSIUM: 4.2 mmol/L (ref 3.5–5.1)
SODIUM: 137 mmol/L (ref 135–145)

## 2015-02-08 MED ORDER — CARVEDILOL 3.125 MG PO TABS: 3.1250 mg | ORAL_TABLET | Freq: Two times a day (BID) | ORAL | Status: DC

## 2015-02-08 MED FILL — Sodium Chloride Irrigation Soln 0.9%: Qty: 500 | Status: AC

## 2015-02-08 MED FILL — Gentamicin Sulfate Inj 40 MG/ML: INTRAMUSCULAR | Qty: 2 | Status: AC

## 2015-02-08 NOTE — Progress Notes (Signed)
Pt given DC instructions and wife questioning RN about pt not being able to shower until 12/19.  Pt's wife said she would just call the office.  Pt verbalized understanding of DC orders and was DC home via wc.

## 2015-02-08 NOTE — Op Note (Signed)
NAMELATHEN, CUCCO NO.:  [id number]  MEDICAL RECORD NO.:  [id number]  LOCATION:  3E11C                        FACILITY:  [facility]  PHYSICIAN:  Deboraha Sprang, MD, FACCDATE OF BIRTH:  06-05-1926  DATE OF PROCEDURE:  02/07/2015 DATE OF DISCHARGE:                              OPERATIVE REPORT   PREOPERATIVE DIAGNOSIS:  Dislodged atrial lead, retraction of the left ventricular lead.  POSTOPERATIVE DIAGNOSIS:  Dislodged atrial lead, retraction of the left ventricular lead.  PROCEDURE:  Atrial lead repositioning.  DESCRIPTION OF PROCEDURE:  Following obtaining informed consent, the patient was brought to the electrophysiology laboratory and placed on the fluoroscopic table in the supine position.  After routine prep and drape of the left upper chest, lidocaine was infiltrated in the prepectoral subclavicular region.  The prior incision was opened and care was made to remove all of the suture.  Pocket hematoma was noted and hematoma was evacuated in its entirety.  We then opened up the atrial lead.  It was noted that there was some mild attachment of the lead at the junction between the innominate and the SVC, but we were able to freed that up without difficulty.  We then repositioned the atrial lead in the right atrial appendage where the bipolar P-wave was 2.2 with a pace impedance of 530, threshold of 2 V at 0.4 milliseconds.  Current of injury was brisk.  This lead was secured to the prepectoral fascia.  We then freed up the LV lead and added about 2-3 cm of slack to the LV lead so as to take out the tension that was noted in the right atrium.  The RV lead was also moved forward about 1-2 cm.  The pocket was copiously irrigated with antibiotic containing saline solution.  The leads all having been secured to the prepectoral fascia.  All the retaining sutures had been removed again in their entirety.  The pocket was copiously irrigated and the leads were  then attached back to the Quadra [id number] device, serial [id number].  Through the device, the bipolar P-wave was 2.2 with a pace impedance of 530, threshold is little bit higher, 2.5 at 0.4.  The RV was 12 with a pace impedance of 510, threshold of 0.75 at 0.4 and the LV was two to Can configuration, had impedance of 430, threshold of 1 V at 1.2 milliseconds.  The pocket was copiously irrigated as noted.  Surgicel was placed in the pocket.  The device was secured to the prepectoral fascia and the wound was then closed in three layers in the normal fashion.  The wound was washed, dried.  A Dermabond dressing was applied.  Needle counts, sponge counts, and instrument counts were correct at the end of the procedure according to the staff.  The patient tolerated the procedure without apparent complication.     Deboraha Sprang, MD, [facility]    SCK/MEDQ  D:  02/07/2015  T:  02/08/2015  Job:  [id number]

## 2015-02-08 NOTE — Discharge Summary (Signed)
ELECTROPHYSIOLOGY PROCEDURE DISCHARGE SUMMARY    Patient ID: [name],  MRN: [id number], DOB/AGE: 1926/10/03 81 y.o.  Admit date: 02/06/2015 Discharge date: 02/08/2015  Primary Care Physician: Velna Hatchet, MD Primary Cardiologist: Irish Lack Electrophysiologist: Lovena Le  Primary Discharge Diagnosis:  Right atrial lead dislodgement s/p revision this admission  Secondary Discharge Diagnosis:  1.  ICM 2.  CAD 3.  Paroxysmal atrial fibrillation 4.  Hypertension 5.  Symptomatic bradycardia 6.  Congestive heart failure  Allergies  Allergen Reactions  . Protonix [Pantoprazole Sodium] Other (See Comments)    Gi upset, insomnia  . Codeine Anxiety and Other (See Comments)    Insomnia/ hyper  . Prilosec [Omeprazole] Nausea And Vomiting and Other (See Comments)    GI upset, insomnia   . Warfarin Sodium Anxiety and Other (See Comments)    Hx gi bleed      Procedures This Admission:  1.  Right atrial lead revision on 02/07/15 by Dr Caryl Comes. The patient underwent successful RA lead repositioning with advancement of LV lead.  There were no early apparent complications.   Brief HPI/Hospital Course:  [name] is a 81 y.o. male with a past medical history as outlined above. He underwent CRTP implantation last week and did well initially.  He then developed fatigue and HR variability and presented to the ER for evaluation. His atrial lead was found to be dislodged.  He was admitted and seen by Dr Caryl Comes who recommended RA lead repositioning.  He underwent lead revision 02/07/15 with no early apparent complications.  CXR 02/08/15 showed stable lead placement with no ptx. Device was interrogated and found to be functioning normally.  He was examined by Dr Curt Bears and considered to be stable for discharge home.    Physical Exam: Filed Vitals:   02/07/15 2100 02/07/15 2359 02/08/15 0337 02/08/15 0600  BP: 115/57 127/67 119/72   Pulse: 59 63 62   Temp: 98.2 F (36.8 C)  98.4 F (36.9 C) 98 F (36.7 C)   TempSrc: Oral Oral Oral   Resp: 18 20 20    Height:      Weight:    191 lb (86.637 kg)  SpO2: 94% 93% 95%     GEN- The patient is elderly appearing, alert and oriented x 3 today.   HEENT: normocephalic, atraumatic; sclera clear, conjunctiva pink; hearing intact; oropharynx clear; neck supple  Lungs- Clear to ausculation bilaterally, normal work of breathing.  No wheezes, rales, rhonchi Heart- Regular rate and rhythm (paced) GI- soft, non-tender, non-distended, bowel sounds present Extremities- no clubbing, cyanosis, or edema  MS- no significant deformity or atrophy Skin- warm and dry, no rash or lesion Psych- euthymic mood, full affect Neuro- strength and sensation are intact    Labs:   Lab Results  Component Value Date   WBC 6.2 02/06/2015   HGB 13.0 02/06/2015   HCT 38.6* 02/06/2015   MCV 95.5 02/06/2015   PLT 137* 02/06/2015     Recent Labs Lab 02/08/15 0400  NA 137  K 4.2  CL 102  CO2 28  BUN 16  CREATININE 1.33*  CALCIUM 8.9  GLUCOSE 94     Discharge Medications:    Medication List    TAKE these medications        ADVANCED EYE HEALTH PO  Take 1 tablet by mouth daily with lunch.     amiodarone 200 MG tablet  Commonly known as:  PACERONE  Take 0.5 tablets (100 mg total) by mouth daily.  amoxicillin 500 MG capsule  Commonly known as:  AMOXIL  Take 2,000 mg by mouth See admin instructions. Take 4 capsules (2000 mg) by mouth one hour prior to dental appointments     bicalutamide 50 MG tablet  Commonly known as:  CASODEX  Take 50 mg by mouth daily.     carvedilol 3.125 MG tablet  Commonly known as:  COREG  Take 1 tablet (3.125 mg total) by mouth 2 (two) times daily with a meal.     clopidogrel 75 MG tablet  Commonly known as:  PLAVIX  Take 1 tablet (75 mg total) by mouth daily.     CREON 36000 UNITS Cpep capsule  Generic drug:  lipase/protease/amylase  Take 36,000 Units by mouth 3 (three) times daily  before meals.     docusate sodium 100 MG capsule  Commonly known as:  COLACE  Take 100 mg by mouth daily.     dorzolamide-timolol 22.3-6.8 MG/ML ophthalmic solution  Commonly known as:  COSOPT  Place 1 drop into both eyes 2 (two) times daily.     doxazosin 4 MG tablet  Commonly known as:  CARDURA  Take 4 mg by mouth at bedtime.     finasteride 5 MG tablet  Commonly known as:  PROSCAR  Take 5 mg by mouth daily.     fluticasone 50 MCG/ACT nasal spray  Commonly known as:  FLONASE  Place 2 sprays into both nostrils daily as needed for allergies.     furosemide 40 MG tablet  Commonly known as:  LASIX  Take 0.5 tablets (20 mg total) by mouth every other day.     LORazepam 0.5 MG tablet  Commonly known as:  ATIVAN  Take 0.5 mg by mouth every 8 (eight) hours as needed for anxiety.     losartan 25 MG tablet  Commonly known as:  COZAAR  Take 0.5 tablets (12.5 mg total) by mouth daily.     multivitamin with minerals Tabs tablet  Take 1 tablet by mouth daily. Centrum Silver     nitroGLYCERIN 0.4 MG SL tablet  Commonly known as:  NITROSTAT  Place 1 tablet (0.4 mg total) under the tongue every 5 (five) minutes as needed for chest pain.     ondansetron 4 MG tablet  Commonly known as:  ZOFRAN  Take 1 tablet by mouth every 6 (six) hours as needed for nausea.     OVER THE COUNTER MEDICATION  Take 2 capsules by mouth 2 (two) times daily. Omega XL     potassium chloride SA 20 MEQ tablet  Commonly known as:  K-DUR,KLOR-CON  Take 1 tablet (20 mEq total) by mouth every other day.     PROBIOTIC PO  Take 2 tablets by mouth daily.     psyllium 58.6 % packet  Commonly known as:  METAMUCIL  Take 1 packet by mouth daily as needed (for constipation).     traMADol 50 MG tablet  Commonly known as:  ULTRAM  Take 50 mg by mouth 3 (three) times daily.        Disposition:  Discharge Instructions    Diet - low sodium heart healthy    Complete by:  As directed      Increase activity  slowly    Complete by:  As directed           Follow-up Information    Follow up with [facility] On 02/14/2015.   Specialty:  Cardiology   Why:  at  9AM for wound check   Contact information:   [address] [place] [phone]      Follow up with Cristopher Peru, MD On 05/05/2015.   Specialty:  Cardiology   Why:  at 9:15AM   Contact information:   [place]. [place] [address] [phone]       Duration of Discharge Encounter: Greater than 30 minutes including physician time.  Signed, Chanetta Marshall, NP 02/08/2015 7:29 AM   I have seen and examined this patient with Chanetta Marshall.  Agree with above, note added to reflect my findings.  On exam, regular rhythm, no murmurs, lungs clear.  Patient with CRT placed and A lead dislodgement.  Repositioned A lead yesterday without issues.  Plan for discharge today with normal follow up in device clinic in 10 days.  Chace Bisch M. Didier Brandenburg MD 02/08/2015 9:54 AM

## 2015-02-08 NOTE — Discharge Instructions (Signed)
° ° °  Supplemental Discharge Instructions for  Pacemaker/Defibrillator Patients  Activity No heavy lifting or vigorous activity with your left/right arm for 6 to 8 weeks.  Do not raise your left/right arm above your head for one week.  Gradually raise your affected arm as drawn below.           __     02-11-15                  02-12-15              02-13-15                 02-14-15  NO DRIVING for   1 week  ; you may begin driving on   [id number]  .  WOUND CARE - Keep the wound area clean and dry.  Do not get this area wet for one week. No showers for one week; you may shower on  02-14-15   . - The tape/steri-strips on your wound will fall off; do not pull them off.  No bandage is needed on the site.  DO  NOT apply any creams, oils, or ointments to the wound area. - If you notice any drainage or discharge from the wound, any swelling or bruising at the site, or you develop a fever > 101? F after you are discharged home, call the office at once.  Special Instructions - You are still able to use cellular telephones; use the ear opposite the side where you have your pacemaker/defibrillator.  Avoid carrying your cellular phone near your device. - When traveling through airports, show security personnel your identification card to avoid being screened in the metal detectors.  Ask the security personnel to use the hand wand. - Avoid arc welding equipment, MRI testing (magnetic resonance imaging), TENS units (transcutaneous nerve stimulators).  Call the office for questions about other devices. - Avoid electrical appliances that are in poor condition or are not properly grounded. - Microwave ovens are safe to be near or to operate.

## 2015-02-11 ENCOUNTER — Telehealth: Payer: Self-pay | Admitting: Internal Medicine

## 2015-02-11 NOTE — Telephone Encounter (Signed)
Spoke to patient about transmission/sx's. Patient states that he is feeling "fair" at the moment, but notices dizziness with walking. I informed him that the remote appeared stable and gave no reason for his dizziness. Patient voiced understanding and stated that he has an appt to f/u with an ENT doctor on 12/20.  I encouraged patient to go to ER if sx's worsen over the weekend. Patient stated that he would rather not go to the ER. I told patient about the Urgent [facility] on [address] since they are also affiliated with [facility]. Patient voiced understanding and appreciation for call.

## 2015-02-11 NOTE — Telephone Encounter (Addendum)
EC states that BP was low yesterday, but has been good since. EC states that patient has had occasional pains in device area only. I asked EC to send in a remote transmission. EC voiced understanding. Will call EC after transmission received.

## 2015-02-11 NOTE — Telephone Encounter (Signed)
New message     Pt came home from [place].  He had a pacemaker procedure.  Since tues, he has been nauseated and dizzy.  He is due to come in mon for a wound check.  Since the weekend is here, they want to talk to someone today

## 2015-02-14 ENCOUNTER — Other Ambulatory Visit (INDEPENDENT_AMBULATORY_CARE_PROVIDER_SITE_OTHER): Payer: Medicare Other | Admitting: *Deleted

## 2015-02-14 ENCOUNTER — Encounter: Payer: Self-pay | Admitting: Internal Medicine

## 2015-02-14 ENCOUNTER — Other Ambulatory Visit: Payer: Self-pay | Admitting: *Deleted

## 2015-02-14 ENCOUNTER — Ambulatory Visit (INDEPENDENT_AMBULATORY_CARE_PROVIDER_SITE_OTHER): Payer: Medicare Other | Admitting: *Deleted

## 2015-02-14 DIAGNOSIS — I447 Left bundle-branch block, unspecified: Secondary | ICD-10-CM

## 2015-02-14 DIAGNOSIS — I5022 Chronic systolic (congestive) heart failure: Secondary | ICD-10-CM | POA: Diagnosis not present

## 2015-02-14 DIAGNOSIS — R001 Bradycardia, unspecified: Secondary | ICD-10-CM | POA: Diagnosis not present

## 2015-02-14 DIAGNOSIS — I480 Paroxysmal atrial fibrillation: Secondary | ICD-10-CM

## 2015-02-14 DIAGNOSIS — Z950 Presence of cardiac pacemaker: Secondary | ICD-10-CM | POA: Diagnosis not present

## 2015-02-14 DIAGNOSIS — R531 Weakness: Secondary | ICD-10-CM

## 2015-02-14 DIAGNOSIS — I4891 Unspecified atrial fibrillation: Secondary | ICD-10-CM | POA: Diagnosis not present

## 2015-02-14 LAB — CBC WITH DIFFERENTIAL/PLATELET
Basophils Absolute: 0 10*3/uL (ref 0.0–0.1)
Basophils Relative: 0 % (ref 0–1)
Eosinophils Absolute: 0.3 10*3/uL (ref 0.0–0.7)
Eosinophils Relative: 3 % (ref 0–5)
HEMATOCRIT: 40.4 % (ref 39.0–52.0)
HEMOGLOBIN: 13.9 g/dL (ref 13.0–17.0)
LYMPHS ABS: 1.9 10*3/uL (ref 0.7–4.0)
Lymphocytes Relative: 23 % (ref 12–46)
MCH: 32.3 pg (ref 26.0–34.0)
MCHC: 34.4 g/dL (ref 30.0–36.0)
MCV: 93.7 fL (ref 78.0–100.0)
MONO ABS: 0.8 10*3/uL (ref 0.1–1.0)
MONOS PCT: 10 % (ref 3–12)
MPV: 10.7 fL (ref 8.6–12.4)
NEUTROS ABS: 5.4 10*3/uL (ref 1.7–7.7)
NEUTROS PCT: 64 % (ref 43–77)
Platelets: 168 10*3/uL (ref 150–400)
RBC: 4.31 MIL/uL (ref 4.22–5.81)
RDW: 13.3 % (ref 11.5–15.5)
WBC: 8.4 10*3/uL (ref 4.0–10.5)

## 2015-02-14 LAB — CUP PACEART INCLINIC DEVICE CHECK
Brady Statistic RA Percent Paced: 45 %
Date Time Interrogation Session: 20161219155525
Implantable Lead Implant Date: 20161205
Implantable Lead Implant Date: 20161205
Lead Channel Setting Pacing Amplitude: 1.5 V
Lead Channel Setting Pacing Pulse Width: 0.4 ms
Lead Channel Setting Pacing Pulse Width: 1 ms
MDC IDC LEAD IMPLANT DT: 20161205
MDC IDC LEAD LOCATION: 753858
MDC IDC LEAD LOCATION: 753859
MDC IDC LEAD LOCATION: 753860
MDC IDC SET LEADCHNL RA PACING AMPLITUDE: 2.75 V
MDC IDC SET LEADCHNL RV PACING AMPLITUDE: 3.5 V
MDC IDC SET LEADCHNL RV SENSING SENSITIVITY: 2 mV
Pulse Gen Serial Number: 7802901

## 2015-02-14 LAB — BASIC METABOLIC PANEL
BUN: 18 mg/dL (ref 7–25)
CALCIUM: 9.4 mg/dL (ref 8.6–10.3)
CHLORIDE: 101 mmol/L (ref 98–110)
CO2: 26 mmol/L (ref 20–31)
Creat: 1.27 mg/dL — ABNORMAL HIGH (ref 0.70–1.11)
GLUCOSE: 94 mg/dL (ref 65–99)
Potassium: 4.8 mmol/L (ref 3.5–5.3)
SODIUM: 136 mmol/L (ref 135–146)

## 2015-02-14 LAB — TSH: TSH: 2.244 u[IU]/mL (ref 0.350–4.500)

## 2015-02-14 NOTE — Progress Notes (Addendum)
Asked to see patient for hypotension at home. Pt reports that his SBP has been 90's at home since starting Coreg. He has persistent fatigue, anorexia, and nausea.  These symptoms have been present since before pacemaker implant.  He reports that after initial implant, he improved for 2 days but then had atrial lead dislodgement requiring atrial lead revision.  Post revision, he has not noticed improvement in symptoms.  His device was interrogated today and functioning normally per device clinic (interrogation not available for me to review at the time I saw patient - sent to Dr Tanna Furry in basket for review).    Exam: Elderly male, no acute distress, alert and oriented x3 RRR, lungs CTAB, no edema  I have advised patient that he follow up with PCP as soon as possible. TSH, CBC, BMET today.  Hold Coreg for now.  Will route to [name] for further advisement as well.  Chanetta Marshall, NP 02/14/2015 7:37 PM

## 2015-02-14 NOTE — Progress Notes (Signed)
Post LV lead revision Wound check appointment. Steri-strips removed. Wound without redness or edema. Incision edges approximated, wound well healed. Normal device function. Thresholds, sensing, and impedances consistent with implant measurements. Device programmed at 3.5V/auto capture programmed on for extra safety margin until 3 month visit. Histogram distribution appropriate for patient and level of activity. 46% mode switch + plavix (Hx GI bleed per pt). No high ventricular rates noted. Changed RA sensing from 0.5 to 0.64mV. Patient educated about wound care, arm mobility, lifting restrictions. ROV GT 05/05/15.

## 2015-02-15 DIAGNOSIS — H903 Sensorineural hearing loss, bilateral: Secondary | ICD-10-CM | POA: Diagnosis not present

## 2015-02-15 DIAGNOSIS — J310 Chronic rhinitis: Secondary | ICD-10-CM | POA: Diagnosis not present

## 2015-02-17 ENCOUNTER — Telehealth: Payer: Self-pay | Admitting: *Deleted

## 2015-02-17 NOTE — Telephone Encounter (Signed)
-----   Message from Nathan Berthold, NP sent at 02/15/2015  6:57 AM EST ----- Please notify patient of stable labs.

## 2015-02-25 ENCOUNTER — Encounter: Payer: Self-pay | Admitting: Internal Medicine

## 2015-03-02 NOTE — Telephone Encounter (Signed)
ERROR

## 2015-04-21 DIAGNOSIS — I059 Rheumatic mitral valve disease, unspecified: Secondary | ICD-10-CM | POA: Diagnosis not present

## 2015-04-21 DIAGNOSIS — L98499 Non-pressure chronic ulcer of skin of other sites with unspecified severity: Secondary | ICD-10-CM | POA: Diagnosis not present

## 2015-04-21 DIAGNOSIS — K5901 Slow transit constipation: Secondary | ICD-10-CM | POA: Diagnosis not present

## 2015-04-21 DIAGNOSIS — I2510 Atherosclerotic heart disease of native coronary artery without angina pectoris: Secondary | ICD-10-CM | POA: Diagnosis not present

## 2015-04-21 DIAGNOSIS — I5020 Unspecified systolic (congestive) heart failure: Secondary | ICD-10-CM | POA: Diagnosis not present

## 2015-04-21 DIAGNOSIS — F419 Anxiety disorder, unspecified: Secondary | ICD-10-CM | POA: Diagnosis not present

## 2015-04-21 DIAGNOSIS — Z85828 Personal history of other malignant neoplasm of skin: Secondary | ICD-10-CM | POA: Diagnosis not present

## 2015-04-21 DIAGNOSIS — K219 Gastro-esophageal reflux disease without esophagitis: Secondary | ICD-10-CM | POA: Diagnosis not present

## 2015-04-21 DIAGNOSIS — C44219 Basal cell carcinoma of skin of left ear and external auricular canal: Secondary | ICD-10-CM | POA: Diagnosis not present

## 2015-04-21 DIAGNOSIS — I10 Essential (primary) hypertension: Secondary | ICD-10-CM | POA: Diagnosis not present

## 2015-04-21 DIAGNOSIS — N400 Enlarged prostate without lower urinary tract symptoms: Secondary | ICD-10-CM | POA: Diagnosis not present

## 2015-04-21 DIAGNOSIS — D485 Neoplasm of uncertain behavior of skin: Secondary | ICD-10-CM | POA: Diagnosis not present

## 2015-04-21 DIAGNOSIS — I4891 Unspecified atrial fibrillation: Secondary | ICD-10-CM | POA: Diagnosis not present

## 2015-04-25 ENCOUNTER — Other Ambulatory Visit: Payer: Self-pay | Admitting: Interventional Cardiology

## 2015-04-28 DIAGNOSIS — M5136 Other intervertebral disc degeneration, lumbar region: Secondary | ICD-10-CM | POA: Diagnosis not present

## 2015-04-28 DIAGNOSIS — M5416 Radiculopathy, lumbar region: Secondary | ICD-10-CM | POA: Diagnosis not present

## 2015-04-28 DIAGNOSIS — M4806 Spinal stenosis, lumbar region: Secondary | ICD-10-CM | POA: Diagnosis not present

## 2015-05-03 ENCOUNTER — Encounter (INDEPENDENT_AMBULATORY_CARE_PROVIDER_SITE_OTHER): Payer: Commercial Managed Care - HMO | Admitting: Ophthalmology

## 2015-05-03 DIAGNOSIS — H348322 Tributary (branch) retinal vein occlusion, left eye, stable: Secondary | ICD-10-CM

## 2015-05-03 DIAGNOSIS — H353132 Nonexudative age-related macular degeneration, bilateral, intermediate dry stage: Secondary | ICD-10-CM

## 2015-05-03 DIAGNOSIS — H43813 Vitreous degeneration, bilateral: Secondary | ICD-10-CM

## 2015-05-03 DIAGNOSIS — I10 Essential (primary) hypertension: Secondary | ICD-10-CM

## 2015-05-03 DIAGNOSIS — H35033 Hypertensive retinopathy, bilateral: Secondary | ICD-10-CM | POA: Diagnosis not present

## 2015-05-05 ENCOUNTER — Ambulatory Visit (INDEPENDENT_AMBULATORY_CARE_PROVIDER_SITE_OTHER): Payer: Commercial Managed Care - HMO | Admitting: Internal Medicine

## 2015-05-05 ENCOUNTER — Encounter: Payer: Self-pay | Admitting: Internal Medicine

## 2015-05-05 VITALS — BP 128/64 | HR 88 | Ht 68.0 in | Wt 189.8 lb

## 2015-05-05 DIAGNOSIS — I5022 Chronic systolic (congestive) heart failure: Secondary | ICD-10-CM

## 2015-05-05 MED ORDER — METOPROLOL TARTRATE 50 MG PO TABS: 50.0000 mg | ORAL_TABLET | Freq: Two times a day (BID) | ORAL | Status: DC

## 2015-05-05 NOTE — Progress Notes (Signed)
HPI [name] returns today for followup. I saw him in the hospital several months ago. He is s/p BiV PPM insertion and has developed worsening atrial fib with his symptoms mostly that of sob and fatigue. He cannot take systemic anti-coagulation. He is not on any AV nodal blocking drugs.  Allergies  Allergen Reactions  . Protonix [Pantoprazole Sodium] Other (See Comments)    Gi upset, insomnia  . Codeine Anxiety and Other (See Comments)    Insomnia/ hyper  . Prilosec [Omeprazole] Nausea And Vomiting and Other (See Comments)    GI upset, insomnia   . Warfarin Sodium Anxiety and Other (See Comments)    Hx gi bleed      Current Outpatient Prescriptions  Medication Sig Dispense Refill  . amiodarone (PACERONE) 200 MG tablet Take 0.5 tablets (100 mg total) by mouth daily. 90 tablet 3  . amoxicillin (AMOXIL) 500 MG capsule Take 2,000 mg by mouth See admin instructions. Take 4 capsules (2000 mg) by mouth one hour prior to dental appointments    . bicalutamide (CASODEX) 50 MG tablet Take 50 mg by mouth daily.   3  . clopidogrel (PLAVIX) 75 MG tablet TAKE 1 TABLET(75 MG) BY MOUTH DAILY 30 tablet 8  . docusate sodium (COLACE) 100 MG capsule Take 100 mg by mouth daily.    . dorzolamide-timolol (COSOPT) 22.3-6.8 MG/ML ophthalmic solution Place 1 drop into both eyes 2 (two) times daily.   2  . doxazosin (CARDURA) 4 MG tablet Take 4 mg by mouth at bedtime.   0  . finasteride (PROSCAR) 5 MG tablet Take 5 mg by mouth daily.    . fluticasone (FLONASE) 50 MCG/ACT nasal spray Place 2 sprays into both nostrils daily as needed for allergies.   1  . furosemide (LASIX) 40 MG tablet Take 0.5 tablets (20 mg total) by mouth every other day. 45 tablet 1  . lipase/protease/amylase (CREON) 36000 UNITS CPEP capsule Take 36,000 Units by mouth 3 (three) times daily before meals.    [name] LORazepam (ATIVAN) 0.5 MG tablet Take 0.5 mg by mouth every 8 (eight) hours as needed for anxiety.     [name] losartan (COZAAR) 25  MG tablet Take 0.5 tablets (12.5 mg total) by mouth daily. 30 tablet 3  . Multiple Vitamin (MULTIVITAMIN WITH MINERALS) TABS tablet Take 1 tablet by mouth daily. Centrum Silver    . nitroGLYCERIN (NITROSTAT) 0.4 MG SL tablet Place 1 tablet (0.4 mg total) under the tongue every 5 (five) minutes as needed for chest pain. 20 tablet 0  . Omega 3-Lutein-Zeaxanthin (ADVANCED EYE HEALTH PO) Take 1 tablet by mouth daily with lunch.     . ondansetron (ZOFRAN) 4 MG tablet Take 1 tablet by mouth every 6 (six) hours as needed for nausea.   0  . OVER THE COUNTER MEDICATION Take 2 capsules by mouth 2 (two) times daily. Omega XL    . potassium chloride SA (K-DUR,KLOR-CON) 20 MEQ tablet Take 1 tablet (20 mEq total) by mouth every other day. 90 tablet 1  . Probiotic Product (PROBIOTIC PO) Take 2 tablets by mouth daily.    . psyllium (METAMUCIL) 58.6 % packet Take 1 packet by mouth daily as needed (for constipation).     . rosuvastatin (CRESTOR) 20 MG tablet Take 20 mg by mouth at bedtime.  5  . traMADol (ULTRAM) 50 MG tablet Take 50 mg by mouth 3 (three) times daily.    . metoprolol (LOPRESSOR) 50 MG tablet Take 1  tablet (50 mg total) by mouth 2 (two) times daily. 180 tablet 3   No current facility-administered medications for this visit.     Past Medical History  Diagnosis Date  . PAF (paroxysmal atrial fibrillation) (HCC)     not on coumadin due to hx of GI bleed.  . Mitral regurgitation     a. Mild-mod by echo 12/2014.  [name] GERD (gastroesophageal reflux disease)     occ. take prevacid  . Meningioma ([place]) right -sided w/ right VI palsy    followed by dr Gaynell Face  . Diverticulitis of colon with bleeding     s/p sigmoid resection '88  . History of GI diverticular bleed april 2012    transfused blood and resolved without surgical intervention  . Impotence, organic     s/p penile prosthesis 1990's  . BPH (benign prostatic hypertrophy) with urinary obstruction     s/p turp yrs ago  . Blood transfusion       "related to a surgery"  . Impaired hearing bilateral     only left hearing aid  . Bradycardia     a. Amio d/c'd 08/2013; brady arrest 08/2013 after PCI >>> recurrent AF >>> Amiodarone restarted. b. Pacemaker being considered in 11/2014.  . Cardiomyopathy ([place])     a. Echo (08/2013):  EF 30-35%, AS hypokinesis, Gr 1 diast dysfn, mild MR, mild LAE >>> b. improved EF 50-55% by echo 8/15. c. EF down again by echo 12/2014 to 30-35% but 51% by nuc.  [name] CAD (coronary artery disease)     a. s/p MI and prior PCI of LAD;  b. LHC (08/2013):  prox LAD 60-70%, mid LAD stents ok, ostial lesion at Dx jailed by stent, mild CFX and RCA disesase >>>  PCI (09/08/13):  rotational atherectomy + Promus DES to prox LAD  . Prostate cancer ([place]) 11/30/13    Gleason 8, volume 22.14 cc  . Anxiety   . Depression   . Hypertension   . Chronic systolic CHF (congestive heart failure) ([place])   . Myocardial infarction [facility]) 1980's- medical intervention    "so mild I didn't know I'd had it"  . Sleep apnea     non-compliant cpap  . DJD (degenerative joint disease) hips and knees    s/p bilateral total replacements  . DDD (degenerative disc disease)     chronic back pain  . Arthritis   . Chronic lower back pain   . Incomplete bladder emptying   . LBBB (left bundle branch block)   . CKD (chronic kidney disease), stage III     a. Per review of labs baseline Cr 1.1-1.3.    ROS:   All systems reviewed and negative except as noted in the HPI.   Past Surgical History  Procedure Laterality Date  . Cholecystectomy    . Total hip arthroplasty Right 03-25-08--  . Total hip arthroplasty Left 2005  . Total knee arthroplasty Right 2004  . Total knee arthroplasty Left 1997  . Inguinal hernia repair Bilateral   . Cataract extraction w/ intraocular lens  implant, bilateral Bilateral ~ 2000  . Appendectomy    . Transurethral resection of prostate  "years ago"  . Penile prosthesis implant  1990's  . Shoulder open rotator cuff  repair Left   . Inner ear surgery Right yrs ago    "trying to get my hearing back  . Cardiac catheterization  2007    noncritical cad (results w/ chart)  . Coronary angioplasty with stent placement  08-03-08    drug-eluting stent x2 distal and mid lad  . Transurethral resection of prostate  01/08/2011    Procedure: TRANSURETHRAL RESECTION OF THE PROSTATE (TURP);  Surgeon: Franchot Gallo;  Location: [facility];  Service: Urology;  Laterality: N/A;  GYRUS   . Prostate biopsy  11/30/13    Gleason 8, vol 22.14 cc  . Gamma knife radiation  2000    [facility] for meningioma, last eval 2013- no change  . Left heart catheterization with coronary angiogram N/A 09/04/2013    Procedure: LEFT HEART CATHETERIZATION WITH CORONARY ANGIOGRAM;  Surgeon: Jettie Booze, MD;  Location: [facility] CATH LAB;  Service: Cardiovascular;  Laterality: N/A;  . Percutaneous coronary rotoblator intervention (pci-r) N/A 09/08/2013    Procedure: PERCUTANEOUS CORONARY ROTOBLATOR INTERVENTION (PCI-R);  Surgeon: Jettie Booze, MD;  Location: [facility] CATH LAB;  Service: Cardiovascular;  Laterality: N/A;  . Revision total knee arthroplasty Right   . Total hip revision Right 3-4 times  . Closed reduction hip dislocation Right "several"  . Esophagogastroduodenoscopy N/A 02/11/2014    Procedure: ESOPHAGOGASTRODUODENOSCOPY (EGD);  Surgeon: Cleotis Nipper, MD;  Location: [facility] ENDOSCOPY;  Service: Endoscopy;  Laterality: N/A;  . Ep implantable device N/A 01/31/2015    Procedure: BiV Pacemaker Insertion CRT-P;  Surgeon: Evans Lance, MD;  Location: [facility] CV LAB;  Service: Cardiovascular;  Laterality: N/A;  . Ep implantable device N/A 02/07/2015    Procedure: Lead Revision/Repair;  Surgeon: Deboraha Sprang, MD;  Location: [facility] CV LAB;  Service: Cardiovascular;  Laterality: N/A;     Family History  Problem Relation Age of Onset  . Heart disease Mother   . Heart attack Mother   . Cancer    . Emphysema  Father   . Cancer Brother     liver cancer     Social History   Social History  . Marital Status: Widowed    Spouse Name: N/A  . Number of Children: N/A  . Years of Education: N/A   Occupational History  . Not on file.   Social History Main Topics  . Smoking status: Former Smoker -- 1.00 packs/day for 14 years    Types: Cigarettes    Quit date: 01/05/1959  . Smokeless tobacco: Never Used  . Alcohol Use: 0.0 oz/week     Comment: 02/09/2014 "I'm not drinking anymore"  . Drug Use: No  . Sexual Activity: No   Other Topics Concern  . Not on file   Social History Narrative     BP 128/64 mmHg  Pulse 88  Ht 5\' 8"  (1.727 m)  Wt 189 lb 12.8 oz (86.093 kg)  BMI 28.87 kg/m2  Physical Exam:  Well appearing elderly man, NAD HEENT: Unremarkable Neck:  7 cm JVD, no thyromegally Back:  No CVA tenderness Lungs:  Clear with no wheezes, rales, or rhonchi. HEART:  Regular rate rhythm, no murmurs, no rubs, no clicks Abd:  soft, positive bowel sounds, no organomegally, no rebound, no guarding Ext:  2 plus pulses, no edema, no cyanosis, no clubbing Skin:  No rashes no nodules Neuro:  CN II through XII intact, motor grossly intact    Assess/Plan:

## 2015-05-05 NOTE — Patient Instructions (Signed)
Medication Instructions:  Your physician has recommended you make the following change in your medication:  1) Start Metoprolol 50mg  ---take 1/2 tablet twice daily for 2 weeks then increase to 1 tablet twice daily    Labwork: None ordered   Testing/Procedures: None ordered   Follow-Up: Your physician recommends that you schedule a follow-up appointment in: 8 weeks with [name]  Any Other Special Instructions Will Be Listed Below (If Applicable).     If you need a refill on your cardiac medications before your next appointment, please call your pharmacy.

## 2015-05-06 LAB — CUP PACEART INCLINIC DEVICE CHECK
Battery Remaining Longevity: 80.4
Date Time Interrogation Session: 20170309141221
Implantable Lead Implant Date: 20161205
Implantable Lead Location: 753858
Implantable Lead Location: 753859
Lead Channel Pacing Threshold Amplitude: 0.75 V
Lead Channel Pacing Threshold Amplitude: 1.75 V
Lead Channel Pacing Threshold Pulse Width: 0.8 ms
Lead Channel Sensing Intrinsic Amplitude: 1.4 mV
Lead Channel Sensing Intrinsic Amplitude: 12 mV
Lead Channel Setting Pacing Amplitude: 2.75 V
Lead Channel Setting Pacing Pulse Width: 0.5 ms
Lead Channel Setting Sensing Sensitivity: 2 mV
MDC IDC LEAD IMPLANT DT: 20161205
MDC IDC LEAD IMPLANT DT: 20161205
MDC IDC LEAD LOCATION: 753860
MDC IDC MSMT BATTERY VOLTAGE: 2.98 V
MDC IDC MSMT LEADCHNL LV IMPEDANCE VALUE: 687.5 Ohm
MDC IDC MSMT LEADCHNL LV PACING THRESHOLD AMPLITUDE: 1.75 V
MDC IDC MSMT LEADCHNL LV PACING THRESHOLD PULSEWIDTH: 0.8 ms
MDC IDC MSMT LEADCHNL RA IMPEDANCE VALUE: 462.5 Ohm
MDC IDC MSMT LEADCHNL RV IMPEDANCE VALUE: 475 Ohm
MDC IDC MSMT LEADCHNL RV PACING THRESHOLD AMPLITUDE: 0.75 V
MDC IDC MSMT LEADCHNL RV PACING THRESHOLD PULSEWIDTH: 0.5 ms
MDC IDC MSMT LEADCHNL RV PACING THRESHOLD PULSEWIDTH: 0.5 ms
MDC IDC SET LEADCHNL LV PACING AMPLITUDE: 2.25 V
MDC IDC SET LEADCHNL LV PACING PULSEWIDTH: 0.8 ms
MDC IDC SET LEADCHNL RV PACING AMPLITUDE: 2.5 V
MDC IDC STAT BRADY RA PERCENT PACED: 21 %
MDC IDC STAT BRADY RV PERCENT PACED: 75 %
Pulse Gen Serial Number: 7802901

## 2015-05-10 ENCOUNTER — Telehealth: Payer: Self-pay | Admitting: Internal Medicine

## 2015-05-10 DIAGNOSIS — C44219 Basal cell carcinoma of skin of left ear and external auricular canal: Secondary | ICD-10-CM | POA: Diagnosis not present

## 2015-05-10 DIAGNOSIS — Z85828 Personal history of other malignant neoplasm of skin: Secondary | ICD-10-CM | POA: Diagnosis not present

## 2015-05-10 NOTE — Telephone Encounter (Signed)
**Note De-Identified  Obfuscation** The pt is overdue for f/u with Dr Irish Lack. He has been scheduled to see Dr Irish Lack tomorrow morning at 10:30.

## 2015-05-10 NOTE — Telephone Encounter (Signed)
New message      Pt c/o BP issue: STAT if pt c/o blurred vision, one-sided weakness or slurred speech  1. What are your last 5 BP readings? 98/57 HR73, 106/78 HR98  2. Are you having any other symptoms (ex. Dizziness, headache, blurred vision, passed out)? Terrible headache 3. What is your BP issue? bp is up and down and HR is going up

## 2015-05-11 ENCOUNTER — Ambulatory Visit: Payer: Commercial Managed Care - HMO | Admitting: Interventional Cardiology

## 2015-05-11 ENCOUNTER — Telehealth: Payer: Self-pay | Admitting: Interventional Cardiology

## 2015-05-11 NOTE — Telephone Encounter (Signed)
Spoke w/care giver-Susan.  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 Irish Lack was cancelled.  Rescheduled for 3/30 w/Brittainy Simmons,PA when Dr. Irish Lack is in the office.  She verbalizes understanding that he will be seeing a PA.

## 2015-05-11 NOTE — Telephone Encounter (Signed)
Pt's care giver Manuela Schwartz called and canceled appt today 3/15.   She stated pt had skin cancer surgy 3/14 and does not feel well.   His BP leveled out or is better pt is not having headaches at the moment.  Manuela Schwartz asked that pt be rescheduled the week of 3/27 please.   Care giver not in week of the 3/20

## 2015-05-12 ENCOUNTER — Encounter: Payer: Self-pay | Admitting: Internal Medicine

## 2015-05-19 ENCOUNTER — Telehealth: Payer: Self-pay | Admitting: Internal Medicine

## 2015-05-19 ENCOUNTER — Emergency Department (HOSPITAL_COMMUNITY): Payer: Commercial Managed Care - HMO

## 2015-05-19 ENCOUNTER — Encounter (HOSPITAL_COMMUNITY): Payer: Self-pay | Admitting: Cardiology

## 2015-05-19 ENCOUNTER — Emergency Department (HOSPITAL_COMMUNITY)
Admission: EM | Admit: 2015-05-19 | Discharge: 2015-05-19 | Disposition: A | Discharge status: Home / Self Care | Payer: Commercial Managed Care - HMO | Attending: Emergency Medicine | Admitting: Emergency Medicine

## 2015-05-19 DIAGNOSIS — Z86018 Personal history of other benign neoplasm: Secondary | ICD-10-CM | POA: Insufficient documentation

## 2015-05-19 DIAGNOSIS — Z9889 Other specified postprocedural states: Secondary | ICD-10-CM | POA: Diagnosis not present

## 2015-05-19 DIAGNOSIS — I5022 Chronic systolic (congestive) heart failure: Secondary | ICD-10-CM | POA: Insufficient documentation

## 2015-05-19 DIAGNOSIS — N183 Chronic kidney disease, stage 3 (moderate): Secondary | ICD-10-CM | POA: Insufficient documentation

## 2015-05-19 DIAGNOSIS — I959 Hypotension, unspecified: Secondary | ICD-10-CM | POA: Diagnosis not present

## 2015-05-19 DIAGNOSIS — I340 Nonrheumatic mitral (valve) insufficiency: Secondary | ICD-10-CM | POA: Insufficient documentation

## 2015-05-19 DIAGNOSIS — H9190 Unspecified hearing loss, unspecified ear: Secondary | ICD-10-CM | POA: Insufficient documentation

## 2015-05-19 DIAGNOSIS — I252 Old myocardial infarction: Secondary | ICD-10-CM | POA: Diagnosis not present

## 2015-05-19 DIAGNOSIS — F419 Anxiety disorder, unspecified: Secondary | ICD-10-CM | POA: Diagnosis not present

## 2015-05-19 DIAGNOSIS — F329 Major depressive disorder, single episode, unspecified: Secondary | ICD-10-CM | POA: Diagnosis not present

## 2015-05-19 DIAGNOSIS — M1990 Unspecified osteoarthritis, unspecified site: Secondary | ICD-10-CM | POA: Diagnosis not present

## 2015-05-19 DIAGNOSIS — I480 Paroxysmal atrial fibrillation: Secondary | ICD-10-CM | POA: Diagnosis not present

## 2015-05-19 DIAGNOSIS — G8929 Other chronic pain: Secondary | ICD-10-CM | POA: Insufficient documentation

## 2015-05-19 DIAGNOSIS — N400 Enlarged prostate without lower urinary tract symptoms: Secondary | ICD-10-CM | POA: Diagnosis not present

## 2015-05-19 DIAGNOSIS — I129 Hypertensive chronic kidney disease with stage 1 through stage 4 chronic kidney disease, or unspecified chronic kidney disease: Secondary | ICD-10-CM | POA: Insufficient documentation

## 2015-05-19 DIAGNOSIS — K219 Gastro-esophageal reflux disease without esophagitis: Secondary | ICD-10-CM | POA: Insufficient documentation

## 2015-05-19 DIAGNOSIS — Z7951 Long term (current) use of inhaled steroids: Secondary | ICD-10-CM | POA: Diagnosis not present

## 2015-05-19 DIAGNOSIS — Z8546 Personal history of malignant neoplasm of prostate: Secondary | ICD-10-CM | POA: Diagnosis not present

## 2015-05-19 DIAGNOSIS — G4730 Sleep apnea, unspecified: Secondary | ICD-10-CM | POA: Insufficient documentation

## 2015-05-19 DIAGNOSIS — Z9861 Coronary angioplasty status: Secondary | ICD-10-CM | POA: Insufficient documentation

## 2015-05-19 DIAGNOSIS — R1110 Vomiting, unspecified: Secondary | ICD-10-CM | POA: Diagnosis not present

## 2015-05-19 DIAGNOSIS — Z87891 Personal history of nicotine dependence: Secondary | ICD-10-CM | POA: Insufficient documentation

## 2015-05-19 DIAGNOSIS — R531 Weakness: Secondary | ICD-10-CM | POA: Diagnosis present

## 2015-05-19 DIAGNOSIS — Z9981 Dependence on supplemental oxygen: Secondary | ICD-10-CM | POA: Diagnosis not present

## 2015-05-19 DIAGNOSIS — Z7902 Long term (current) use of antithrombotics/antiplatelets: Secondary | ICD-10-CM | POA: Insufficient documentation

## 2015-05-19 DIAGNOSIS — Z79899 Other long term (current) drug therapy: Secondary | ICD-10-CM | POA: Insufficient documentation

## 2015-05-19 LAB — BASIC METABOLIC PANEL
Anion gap: 10 (ref 5–15)
BUN: 20 mg/dL (ref 6–20)
CALCIUM: 9.2 mg/dL (ref 8.9–10.3)
CHLORIDE: 104 mmol/L (ref 101–111)
CO2: 23 mmol/L (ref 22–32)
CREATININE: 1.54 mg/dL — AB (ref 0.61–1.24)
GFR calc Af Amer: 45 mL/min — ABNORMAL LOW (ref >60)
GFR calc non Af Amer: 39 mL/min — ABNORMAL LOW (ref >60)
GLUCOSE: 111 mg/dL — AB (ref 65–99)
Potassium: 4.4 mmol/L (ref 3.5–5.1)
Sodium: 137 mmol/L (ref 135–145)

## 2015-05-19 LAB — I-STAT TROPONIN, ED: TROPONIN I, POC: 0 ng/mL (ref 0.00–0.08)

## 2015-05-19 LAB — PROTIME-INR
INR: 1.09 (ref 0.00–1.49)
Prothrombin Time: 14.3 seconds (ref 11.6–15.2)

## 2015-05-19 LAB — CBC
HCT: 39.1 % (ref 39.0–52.0)
Hemoglobin: 13.1 g/dL (ref 13.0–17.0)
MCH: 31.2 pg (ref 26.0–34.0)
MCHC: 33.5 g/dL (ref 30.0–36.0)
MCV: 93.1 fL (ref 78.0–100.0)
PLATELETS: 128 10*3/uL — AB (ref 150–400)
RBC: 4.2 MIL/uL — AB (ref 4.22–5.81)
RDW: 13.6 % (ref 11.5–15.5)
WBC: 6.5 10*3/uL (ref 4.0–10.5)

## 2015-05-19 MED ORDER — SODIUM CHLORIDE 0.9 % IV BOLUS (SEPSIS)
1000.0000 mL | Freq: Once | INTRAVENOUS | Status: AC
  Administered 2015-05-19: 1000 mL via INTRAVENOUS

## 2015-05-19 NOTE — ED Notes (Signed)
Pacemaker interrogated by Agricultural consultant.

## 2015-05-19 NOTE — ED Notes (Signed)
Pt reports that his BP has been low this morning and he has felt weak and dizzy. Has a pacemaker.

## 2015-05-19 NOTE — ED Notes (Signed)
Per [facility], pacemaker interrogation showed no recent events, they reported patient has been in Afib over the past 3-4 days.

## 2015-05-19 NOTE — Discharge Instructions (Signed)
Hypotension  As your heart beats, it forces blood through your arteries. This force is your blood pressure. If your blood pressure is too low for you to go about your normal activities or to support the organs of your body, you have hypotension. Hypotension is also referred to as low blood pressure. When your blood pressure becomes too low, you may not get enough blood to your brain. As a result, you may feel weak, feel lightheaded, or develop a rapid heart rate. In a more severe case, you may faint.  CAUSES  Various conditions can cause hypotension. These include:  · Blood loss.  · Dehydration.  · Heart or endocrine problems.  · Pregnancy.  · Severe infection.  · Not having a well-balanced diet filled with needed nutrients.  · Severe allergic reactions (anaphylaxis).  Some medicines, such as blood pressure medicine or water pills (diuretics), may lower your blood pressure below normal. Sometimes taking too much medicine or taking medicine not as directed can cause hypotension.  TREATMENT   Hospitalization is sometimes required for hypotension if fluid or blood replacement is needed, if time is needed for medicines to wear off, or if further monitoring is needed. Treatment might include changing your diet, changing your medicines (including medicines aimed at raising your blood pressure), and use of support stockings.  HOME CARE INSTRUCTIONS   · Drink enough fluids to keep your urine clear or pale yellow.  · Take your medicines as directed by your health care provider.  · Get up slowly from reclining or sitting positions. This gives your blood pressure a chance to adjust.  · Wear support stockings as directed by your health care provider.  · Maintain a healthy diet by including nutritious food, such as fruits, vegetables, nuts, whole grains, and lean meats.  SEEK MEDICAL CARE IF:  · You have vomiting or diarrhea.  · You have a fever for more than 2-3 days.  · You feel more thirsty than usual.  · You feel weak and  tired.  SEEK IMMEDIATE MEDICAL CARE IF:   · You have chest pain or a fast or irregular heartbeat.  · You have a loss of feeling in some part of your body, or you lose movement in your arms or legs.  · You have trouble speaking.  · You become sweaty or feel lightheaded.  · You faint.  MAKE SURE YOU:   · Understand these instructions.  · Will watch your condition.  · Will get help right away if you are not doing well or get worse.     This information is not intended to replace advice given to you by your health care provider. Make sure you discuss any questions you have with your health care provider.     Document Released: 02/12/2005 Document Revised: 12/03/2012 Document Reviewed: 08/15/2012  Elsevier Interactive Patient Education ©2016 [employer]

## 2015-05-19 NOTE — Telephone Encounter (Signed)
Spoke with pt's daughter in law and she states pt's BP this morning is 74/30, HR 70. She states pt is very weak, dizzy and nauseous.  Daughter in law states that she is only aware of this occuring since this morning but could have been longer. Advised daughter in law that pt needs to proceed to the hospital.  Daughter in law is agreeable and said she will bring pt to [facility].

## 2015-05-19 NOTE — ED Provider Notes (Signed)
CSN: [id number]     Arrival date & time 05/19/15  1145 History   First MD Initiated Contact with Patient 05/19/15 1209     Chief Complaint  Patient presents with  . Hypotension     (Consider location/radiation/quality/duration/timing/severity/associated sxs/prior Treatment) HPI Comments: Patient presents emergency department with chief complaint of hypotension. He states that he has been feeling weak and has been hypotensive for the past week or so.  He states that he started new medication, but does not know what it is. He denies any fevers, but states that he has had some chills. Denies any generalized body aches or cough. Denies any chest pain or shortness of breath. He states that he has felt dizzy/lightheaded, and has felt nauseated at these times. He denies taking anything for symptoms. He is followed by Dr. Lovena Le of cardiology. There are no modifying factors.   The history is provided by the patient. No language interpreter was used.    Past Medical History  Diagnosis Date  . PAF (paroxysmal atrial fibrillation) (HCC)     not on coumadin due to hx of GI bleed.  . Mitral regurgitation     a. Mild-mod by echo 12/2014.  [name] GERD (gastroesophageal reflux disease)     occ. take prevacid  . Meningioma ([place]) right -sided w/ right VI palsy    followed by dr Gaynell Face  . Diverticulitis of colon with bleeding     s/p sigmoid resection '88  . History of GI diverticular bleed april 2012    transfused blood and resolved without surgical intervention  . Impotence, organic     s/p penile prosthesis 1990's  . BPH (benign prostatic hypertrophy) with urinary obstruction     s/p turp yrs ago  . Blood transfusion     "related to a surgery"  . Impaired hearing bilateral     only left hearing aid  . Bradycardia     a. Amio d/c'd 08/2013; brady arrest 08/2013 after PCI >>> recurrent AF >>> Amiodarone restarted. b. Pacemaker being considered in 11/2014.  . Cardiomyopathy ([place])     a. Echo  (08/2013):  EF 30-35%, AS hypokinesis, Gr 1 diast dysfn, mild MR, mild LAE >>> b. improved EF 50-55% by echo 8/15. c. EF down again by echo 12/2014 to 30-35% but 51% by nuc.  [name] CAD (coronary artery disease)     a. s/p MI and prior PCI of LAD;  b. LHC (08/2013):  prox LAD 60-70%, mid LAD stents ok, ostial lesion at Dx jailed by stent, mild CFX and RCA disesase >>>  PCI (09/08/13):  rotational atherectomy + Promus DES to prox LAD  . Prostate cancer ([place]) 11/30/13    Gleason 8, volume 22.14 cc  . Anxiety   . Depression   . Hypertension   . Chronic systolic CHF (congestive heart failure) ([place])   . Myocardial infarction [facility]) 1980's- medical intervention    "so mild I didn't know I'd had it"  . Sleep apnea     non-compliant cpap  . DJD (degenerative joint disease) hips and knees    s/p bilateral total replacements  . DDD (degenerative disc disease)     chronic back pain  . Arthritis   . Chronic lower back pain   . Incomplete bladder emptying   . LBBB (left bundle branch block)   . CKD (chronic kidney disease), stage III     a. Per review of labs baseline Cr 1.1-1.3.   Past Surgical History  Procedure Laterality Date  .  Cholecystectomy    . Total hip arthroplasty Right 03-25-08--  . Total hip arthroplasty Left 2005  . Total knee arthroplasty Right 2004  . Total knee arthroplasty Left 1997  . Inguinal hernia repair Bilateral   . Cataract extraction w/ intraocular lens  implant, bilateral Bilateral ~ 2000  . Appendectomy    . Transurethral resection of prostate  "years ago"  . Penile prosthesis implant  1990's  . Shoulder open rotator cuff repair Left   . Inner ear surgery Right yrs ago    "trying to get my hearing back  . Cardiac catheterization  2007    noncritical cad (results w/ chart)  . Coronary angioplasty with stent placement  08-03-08    drug-eluting stent x2 distal and mid lad  . Transurethral resection of prostate  01/08/2011    Procedure: TRANSURETHRAL RESECTION OF THE  PROSTATE (TURP);  Surgeon: Franchot Gallo;  Location: [place];  Service: Urology;  Laterality: N/A;  GYRUS   . Prostate biopsy  11/30/13    Gleason 8, vol 22.14 cc  . Gamma knife radiation  2000    [facility] for meningioma, last eval 2013- no change  . Left heart catheterization with coronary angiogram N/A 09/04/2013    Procedure: LEFT HEART CATHETERIZATION WITH CORONARY ANGIOGRAM;  Surgeon: Jettie Booze, MD;  Location: [facility] CATH LAB;  Service: Cardiovascular;  Laterality: N/A;  . Percutaneous coronary rotoblator intervention (pci-r) N/A 09/08/2013    Procedure: PERCUTANEOUS CORONARY ROTOBLATOR INTERVENTION (PCI-R);  Surgeon: Jettie Booze, MD;  Location: [facility] CATH LAB;  Service: Cardiovascular;  Laterality: N/A;  . Revision total knee arthroplasty Right   . Total hip revision Right 3-4 times  . Closed reduction hip dislocation Right "several"  . Esophagogastroduodenoscopy N/A 02/11/2014    Procedure: ESOPHAGOGASTRODUODENOSCOPY (EGD);  Surgeon: Cleotis Nipper, MD;  Location: [facility] ENDOSCOPY;  Service: Endoscopy;  Laterality: N/A;  . Ep implantable device N/A 01/31/2015    Procedure: BiV Pacemaker Insertion CRT-P;  Surgeon: Evans Lance, MD;  Location: [place] CV LAB;  Service: Cardiovascular;  Laterality: N/A;  . Ep implantable device N/A 02/07/2015    Procedure: Lead Revision/Repair;  Surgeon: Deboraha Sprang, MD;  Location: [place] CV LAB;  Service: Cardiovascular;  Laterality: N/A;   Family History  Problem Relation Age of Onset  . Heart disease Mother   . Heart attack Mother   . Cancer    . Emphysema Father   . Cancer Brother     liver cancer   Social History  Substance Use Topics  . Smoking status: Former Smoker -- 1.00 packs/day for 14 years    Types: Cigarettes    Quit date: 01/05/1959  . Smokeless tobacco: Never Used  . Alcohol Use: 0.0 oz/week     Comment: 02/09/2014 "I'm not drinking anymore"    Review of Systems  Constitutional:  Negative for fever and chills.  Respiratory: Negative for shortness of breath.   Cardiovascular: Negative for chest pain.  Gastrointestinal: Negative for nausea, vomiting, diarrhea and constipation.  Genitourinary: Negative for dysuria.  Neurological: Positive for light-headedness.  All other systems reviewed and are negative.     Allergies  Protonix; Codeine; Prilosec; and Warfarin sodium  Home Medications   Prior to Admission medications   Medication Sig Start Date End Date Taking? Authorizing Provider  amiodarone (PACERONE) 200 MG tablet Take 0.5 tablets (100 mg total) by mouth daily. 01/18/15   Renee Dyane Dustman, PA-C  amoxicillin (AMOXIL) 500 MG capsule Take 2,000  mg by mouth See admin instructions. Take 4 capsules (2000 mg) by mouth one hour prior to dental appointments    Historical Provider, MD  bicalutamide (CASODEX) 50 MG tablet Take 50 mg by mouth daily.  12/29/13   Historical Provider, MD  clopidogrel (PLAVIX) 75 MG tablet TAKE 1 TABLET(75 MG) BY MOUTH DAILY 04/26/15   Jettie Booze, MD  docusate sodium (COLACE) 100 MG capsule Take 100 mg by mouth daily.    Historical Provider, MD  dorzolamide-timolol (COSOPT) 22.3-6.8 MG/ML ophthalmic solution Place 1 drop into both eyes 2 (two) times daily.  11/12/13   Historical Provider, MD  doxazosin (CARDURA) 4 MG tablet Take 4 mg by mouth at bedtime.  06/03/14   Historical Provider, MD  finasteride (PROSCAR) 5 MG tablet Take 5 mg by mouth daily. 12/08/13   Historical Provider, MD  fluticasone (FLONASE) 50 MCG/ACT nasal spray Place 2 sprays into both nostrils daily as needed for allergies.  12/08/13   Historical Provider, MD  furosemide (LASIX) 40 MG tablet Take 0.5 tablets (20 mg total) by mouth every other day. 01/18/15   Renee Dyane Dustman, PA-C  lipase/protease/amylase (CREON) 36000 UNITS CPEP capsule Take 36,000 Units by mouth 3 (three) times daily before meals.    Historical Provider, MD  LORazepam (ATIVAN) 0.5 MG tablet Take 0.5 mg  by mouth every 8 (eight) hours as needed for anxiety.     Historical Provider, MD  losartan (COZAAR) 25 MG tablet Take 0.5 tablets (12.5 mg total) by mouth daily. 12/28/14   Burtis Junes, NP  metoprolol (LOPRESSOR) 50 MG tablet Take 1 tablet (50 mg total) by mouth 2 (two) times daily. 05/05/15   Evans Lance, MD  Multiple Vitamin (MULTIVITAMIN WITH MINERALS) TABS tablet Take 1 tablet by mouth daily. Centrum Silver    Historical Provider, MD  nitroGLYCERIN (NITROSTAT) 0.4 MG SL tablet Place 1 tablet (0.4 mg total) under the tongue every 5 (five) minutes as needed for chest pain. 09/21/13   Liliane Shi, PA-C  Omega 3-Lutein-Zeaxanthin (ADVANCED EYE HEALTH PO) Take 1 tablet by mouth daily with lunch.     Historical Provider, MD  ondansetron (ZOFRAN) 4 MG tablet Take 1 tablet by mouth every 6 (six) hours as needed for nausea.  01/12/15   Historical Provider, MD  OVER THE COUNTER MEDICATION Take 2 capsules by mouth 2 (two) times daily. Omega XL    Historical Provider, MD  potassium chloride SA (K-DUR,KLOR-CON) 20 MEQ tablet Take 1 tablet (20 mEq total) by mouth every other day. 01/18/15   Renee Dyane Dustman, PA-C  Probiotic Product (PROBIOTIC PO) Take 2 tablets by mouth daily.    Historical Provider, MD  psyllium (METAMUCIL) 58.6 % packet Take 1 packet by mouth daily as needed (for constipation).     Historical Provider, MD  rosuvastatin (CRESTOR) 20 MG tablet Take 20 mg by mouth at bedtime. 03/24/15   Historical Provider, MD  traMADol (ULTRAM) 50 MG tablet Take 50 mg by mouth 3 (three) times daily. 12/23/14   Historical Provider, MD   BP 91/64 mmHg  Pulse 71  Temp(Src) 98 F (36.7 C) (Oral)  Resp 20  SpO2 94% Physical Exam  Constitutional: He is oriented to person, place, and time. He appears well-developed and well-nourished.  HENT:  Head: Normocephalic and atraumatic.  Eyes: Conjunctivae and EOM are normal. Pupils are equal, round, and reactive to light. Right eye exhibits no discharge. Left  eye exhibits no discharge. No scleral icterus.  Neck: Normal range  of motion. Neck supple. No JVD present.  Cardiovascular: Normal rate, regular rhythm and normal heart sounds.  Exam reveals no gallop and no friction rub.   No murmur heard. Pulmonary/Chest: Effort normal and breath sounds normal. No respiratory distress. He has no wheezes. He has no rales. He exhibits no tenderness.  Abdominal: Soft. He exhibits no distension and no mass. There is no tenderness. There is no rebound and no guarding.  Musculoskeletal: Normal range of motion. He exhibits no edema or tenderness.  Neurological: He is alert and oriented to person, place, and time.  Skin: Skin is warm and dry.  Psychiatric: He has a normal mood and affect. His behavior is normal. Judgment and thought content normal.  Nursing note and vitals reviewed.   ED Course  Procedures (including critical care time)  Results for orders placed or performed during the hospital encounter of [id number]  Basic metabolic panel  Result Value Ref Range   Sodium 137 135 - 145 mmol/L   Potassium 4.4 3.5 - 5.1 mmol/L   Chloride 104 101 - 111 mmol/L   CO2 23 22 - 32 mmol/L   Glucose, Bld 111 (H) 65 - 99 mg/dL   BUN 20 6 - 20 mg/dL   Creatinine, Ser 1.54 (H) 0.61 - 1.24 mg/dL   Calcium 9.2 8.9 - 10.3 mg/dL   GFR calc non Af Amer 39 (L) >60 mL/min   GFR calc Af Amer 45 (L) >60 mL/min   Anion gap 10 5 - 15  CBC  Result Value Ref Range   WBC 6.5 4.0 - 10.5 K/uL   RBC 4.20 (L) 4.22 - 5.81 MIL/uL   Hemoglobin 13.1 13.0 - 17.0 g/dL   HCT 39.1 39.0 - 52.0 %   MCV 93.1 78.0 - 100.0 fL   MCH 31.2 26.0 - 34.0 pg   MCHC 33.5 30.0 - 36.0 g/dL   RDW 13.6 11.5 - 15.5 %   Platelets 128 (L) 150 - 400 K/uL  Protime-INR - (order if Patient is taking Coumadin / Warfarin)  Result Value Ref Range   Prothrombin Time 14.3 11.6 - 15.2 seconds   INR 1.09 0.00 - 1.49  I-stat troponin, ED (not at [facility], [facility])  Result Value Ref Range   Troponin i, poc 0.00 0.00 -  0.08 ng/mL   Comment 3           Dg Chest 2 View  05/19/2015  CLINICAL DATA:  Nausea and dizziness for 1 week EXAM: CHEST  2 VIEW COMPARISON:  02/08/2015 FINDINGS: The pacer wires are stable. The heart is within normal limits in size and stable. There is tortuosity and calcification of the thoracic aorta. The lungs are clear of acute process. No pleural effusion. The bony thorax is intact. IMPRESSION: No acute cardiopulmonary findings. Electronically Signed   By: Marijo Sanes M.D.   On: 05/19/2015 13:24     I have personally reviewed and evaluated these images and lab results as part of my medical decision-making.   EKG Interpretation   Date/Time:  Thursday May 19 2015 11:55:41 EDT Ventricular Rate:  70 PR Interval:    QRS Duration: 176 QT Interval:  516 QTC Calculation: 557 R Axis:   128 Text Interpretation:  ** Suspect unspecified pacemaker failure  Ventricular-paced rhythm Abnormal ECG Confirmed by GOLDSTON  MD, SCOTT  ([id number]) on 05/19/2015 12:19:59 PM      MDM   Final diagnoses:  Hypotension, unspecified hypotension type    Patient with several episodes of hypotension  throughout the past week. Advised to come to the emergency department by his cardiology office. Patient has had normal blood pressure here. He is not orthostatic. He feels well now. I discussed patient with Dr. Scarlette Calico, who recommends discontinuing metoprolol for the time being. Pacemaker interrogated, no reported issues, findings discussed with Dr. Regenia Skeeter.  Patient seen by and discussed with Dr. Regenia Skeeter, who recommends discharge. Patient understands agrees the plan. Labs are otherwise reassuring. Patient is stable and ready for discharge.    Montine Circle, PA-C 05/19/15 1513  Sherwood Gambler, MD 05/22/15 [phone]

## 2015-05-19 NOTE — Telephone Encounter (Signed)
New message      Patient's bp is 70/30 and he is weak and nausea.  Should he be seen today?

## 2015-05-19 NOTE — ED Notes (Signed)
[employer] Pacemaker interrogation report given to PA [employer]

## 2015-05-26 ENCOUNTER — Ambulatory Visit (INDEPENDENT_AMBULATORY_CARE_PROVIDER_SITE_OTHER): Payer: Commercial Managed Care - HMO | Admitting: Cardiology

## 2015-05-26 ENCOUNTER — Encounter: Payer: Self-pay | Admitting: Cardiology

## 2015-05-26 VITALS — BP 75/47 | HR 65 | Ht 69.0 in | Wt 189.9 lb

## 2015-05-26 DIAGNOSIS — I959 Hypotension, unspecified: Secondary | ICD-10-CM | POA: Diagnosis not present

## 2015-05-26 NOTE — Progress Notes (Signed)
05/26/2015 [name]   [birth year]  [id number]  Primary Physician Wenda Low, MD Primary Cardiologist: Dr. Irish Lack Electrophysiologist: Dr. Lovena Le  Reason for Visit/CC: Hypotension  HPI:  He has a history of atrial fib - NSR on Amiodarone, bradycardia, LBBB, past GI bleed, and CAD. He had a complex PCI of his LAD in July 2015 involving rotational atherectomy and DES. Prior to the intervention, his ejection fraction was in the 30-35% range. If his EF had remained depressed [name] was going to place a BiV pacemaker. Echocardiogram about a month after the intervention showed an improvement in his EF from 50-55%. He has not been on chronic anticoagulation due to prior history of GI bleeding. His most recent ischemic eval was a NST performed in April 2016 for atypical chest pain, which showed an EF of 30-35% but no ischemia - low risk. He was also noted to have sinus bradycardia with HR in the 40's. He was re-evaluated by Dr. Lovena Le, who decided to implant a CRT PPM given ICM and symptomatic bradycardia. This was performed 01/31/15 by [name]. A week later, he developed fatigue and heart rate variability. 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 The patient underwent successful RA lead repositioning with advancement of LV lead.   He presents to clinic today for evaluation given recent issues with hypotension. He was seen in the [facility] ED [facility]/19/15. Per provider notes, his device was interrogated and functioning properly. He was noted to have normal BP in the ED and orthostatic vitals were negative. Dr. Regenia Skeeter, ED MD, discussed with Dr. Irish Lack, who recommended discontinuation of metoprolol.   The patient now reports to clinic for evaluation. He feels poorly and is hypotensive with a pressure of 75/47.  Repeat BP is 78/44. EKG shows normal rhythm with rate of 65 bpm. He is mentating well, but states that he feels poorly with  weakness and fatigue. No syncope/ near syncope. No dyspnea or CP. He notes recent cough with white colored mucus but denies fever and chills. No n/v/d. He notes decreased PO intake over the last several days. He has refrained from metoprolol but did take his losartan, lasix and doxazosin earlier today.      Current Outpatient Prescriptions  Medication Sig Dispense Refill  . amiodarone (PACERONE) 200 MG tablet Take 0.5 tablets (100 mg total) by mouth daily. 90 tablet 3  . amoxicillin (AMOXIL) 500 MG capsule Take 2,000 mg by mouth See admin instructions. Take 4 capsules (2000 mg) by mouth one hour prior to dental appointments    . bicalutamide (CASODEX) 50 MG tablet Take 50 mg by mouth daily.   3  . clopidogrel (PLAVIX) 75 MG tablet TAKE 1 TABLET(75 MG) BY MOUTH DAILY 30 tablet 8  . docusate sodium (COLACE) 100 MG capsule Take 100 mg by mouth daily.    . dorzolamide-timolol (COSOPT) 22.3-6.8 MG/ML ophthalmic solution Place 1 drop into both eyes 2 (two) times daily.   2  . finasteride (PROSCAR) 5 MG tablet Take 5 mg by mouth daily.    . fluticasone (FLONASE) 50 MCG/ACT nasal spray Place 2 sprays into both nostrils daily as needed for allergies.   1  . lipase/protease/amylase (CREON) 36000 UNITS CPEP capsule Take 36,000 Units by mouth 3 (three) times daily before meals.    [name] LORazepam (ATIVAN) 0.5 MG tablet Take 0.5 mg by mouth every 8 (eight) hours as needed for anxiety.     . Multiple Vitamin (  MULTIVITAMIN WITH MINERALS) TABS tablet Take 1 tablet by mouth daily. Centrum Silver    . nitroGLYCERIN (NITROSTAT) 0.4 MG SL tablet Place 1 tablet (0.4 mg total) under the tongue every 5 (five) minutes as needed for chest pain. 20 tablet 0  . Omega 3-Lutein-Zeaxanthin (ADVANCED EYE HEALTH PO) Take 1 tablet by mouth daily with lunch.     . ondansetron (ZOFRAN) 4 MG tablet Take 1 tablet by mouth every 6 (six) hours as needed for nausea.   0  . OVER THE COUNTER MEDICATION Take 2 capsules by mouth 2 (two) times  daily. Omega XL    . Probiotic Product (PROBIOTIC PO) Take 2 tablets by mouth daily.    . psyllium (METAMUCIL) 58.6 % packet Take 1 packet by mouth daily as needed (for constipation).     . rosuvastatin (CRESTOR) 20 MG tablet Take 20 mg by mouth at bedtime.  5  . traMADol (ULTRAM) 50 MG tablet Take 50 mg by mouth daily.      No current facility-administered medications for this visit.    Allergies  Allergen Reactions  . Protonix [Pantoprazole Sodium] Other (See Comments)    Gi upset, insomnia  . Codeine Anxiety and Other (See Comments)    Insomnia/ hyper  . Prilosec [Omeprazole] Nausea And Vomiting and Other (See Comments)    GI upset, insomnia   . Warfarin Sodium Anxiety and Other (See Comments)    Hx gi bleed     Social History   Social History  . Marital Status: Widowed    Spouse Name: N/A  . Number of Children: N/A  . Years of Education: N/A   Occupational History  . Not on file.   Social History Main Topics  . Smoking status: Former Smoker -- 1.00 packs/day for 14 years    Types: Cigarettes    Quit date: 01/05/1959  . Smokeless tobacco: Never Used  . Alcohol Use: 0.0 oz/week     Comment: 02/09/2014 "I'm not drinking anymore"  . Drug Use: No  . Sexual Activity: No   Other Topics Concern  . Not on file   Social History Narrative     Review of Systems: General: negative for chills, fever, night sweats or weight changes.  Cardiovascular: negative for chest pain, dyspnea on exertion, edema, orthopnea, palpitations, paroxysmal nocturnal dyspnea or shortness of breath Dermatological: negative for rash Respiratory: negative for cough or wheezing Urologic: negative for hematuria Abdominal: negative for nausea, vomiting, diarrhea, bright red blood per rectum, melena, or hematemesis Neurologic: negative for visual changes, syncope, or dizziness All other systems reviewed and are otherwise negative except as noted above.    Blood pressure 75/47, pulse 65, height  5\' 9"  (1.753 m), weight 189 lb 14.4 oz (86.138 kg), SpO2 97 %.  General appearance: alert, cooperative and no distress Neck: no carotid bruit and no JVD Lungs: clear to auscultation bilaterally Heart: regular rate and rhythm, S1, S2 normal, no murmur, click, rub or gallop Extremities: no LEE Pulses: 2+ and symmetric Skin: warm and dry, decreased skin turgor Neurologic: Grossly normal  EKG paced rhythm 65 bpm    ASSESSMENT AND PLAN:   1. Hypotension: BP in clinic is 75/47. Pulse rate is stable. He is mentating well. No syncope/ near syncope, CP or dyspnea. His only complaint is weakness and fatigue. He admits to decreased PO intake over the last week. Physical exam suggest dehydration with decreased skin turgor. Patient also seen by Dr. Acie Fredrickson, DOD, who agrees patient is dehydrated. We dicussed  several options with patient including hospitalization vs outpatient medical management. We will attempt to manage as an outpatient, but patient instructed to go to ED if no improvement. We will have him hold his lasix, losartan and doxazosin to allow for increase in BP. Patient also instructed to increase PO intake with fluids. He was also encouraged to eat chicken noodle soup which contains sodium and protein. Patient and his caregiver both feel comfortable with this approach. Patient will call the office tomorrow with an update and will go to ED if no improvement. Patient to also monitor for too much fluid retention. F/u with me next week.     Lyda Jester PA-C 05/26/2015 1:09 PM

## 2015-05-26 NOTE — Patient Instructions (Signed)
Medication Instructions:  Your physician has recommended you make the following change in your medication:  1) STOP Lasix 2) STOP Potassium 3) STOP Losartan 4) STOP Cardura   Labwork: None ordered  Testing/Procedures: None ordered  Follow-Up: You have a follow up appoint ment scheduled on 05/31/15 @ 8am with Brittainy Simmons,PA  Any Other Special Instructions Will Be Listed Below (If Applicable). Please liberalize your fluid in take, eat some chicken noodle soup.  If your are not feeling better by tomorrow, please go to the Emergency room  If you are feeling better, please call the office to give [place] an update     If you need a refill on your cardiac medications before your next appointment, please call your pharmacy.

## 2015-05-31 ENCOUNTER — Encounter: Payer: Self-pay | Admitting: Cardiology

## 2015-05-31 ENCOUNTER — Ambulatory Visit (INDEPENDENT_AMBULATORY_CARE_PROVIDER_SITE_OTHER): Payer: Commercial Managed Care - HMO | Admitting: Cardiology

## 2015-05-31 VITALS — BP 122/70 | HR 52 | Ht 68.0 in | Wt 192.8 lb

## 2015-05-31 DIAGNOSIS — I9589 Other hypotension: Secondary | ICD-10-CM

## 2015-05-31 MED ORDER — FUROSEMIDE 20 MG PO TABS: 20.0000 mg | ORAL_TABLET | Freq: Every day | ORAL | Status: DC | PRN

## 2015-05-31 MED ORDER — DOXAZOSIN MESYLATE 4 MG PO TABS: 4.0000 mg | ORAL_TABLET | Freq: Every day | ORAL | Status: DC

## 2015-05-31 MED ORDER — POTASSIUM CHLORIDE ER 10 MEQ PO TBCR: 10.0000 meq | EXTENDED_RELEASE_TABLET | Freq: Every day | ORAL | Status: DC | PRN

## 2015-05-31 NOTE — Progress Notes (Signed)
05/31/2015 [name]   [birth year]  [id number]  Primary Physician Wenda Low, MD Primary Cardiologist: Dr. Irish Lack Electrophysiologist: Dr. Lovena Le  Reason for Visit/CC:  F/u for Hypotension  HPI: The patient is a 82 y/o male, who presents back to clinic today for f/u for hypotension. He has a history of atrial fib - NSR on Amiodarone, bradycardia, LBBB, past GI bleed, and CAD. He had a complex PCI of his LAD in July 2015 involving rotational atherectomy and DES. Prior to the intervention, his ejection fraction was in the 30-35% range. If his EF had remained depressed [name] was going to place a BiV pacemaker. Echocardiogram about a month after the intervention showed an improvement in his EF from 50-55%. He has not been on chronic anticoagulation due to prior history of GI bleeding. His most recent ischemic eval was a NST performed in April 2016 for atypical chest pain, which showed an EF of 30-35% but no ischemia - low risk. He was also noted to have sinus bradycardia with HR in the 40's. He was re-evaluated by Dr. Lovena Le, who decided to implant a CRT PPM given ICM and symptomatic bradycardia. This was performed 01/31/15 by [name]. A week later, he developed fatigue and heart rate variability. 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 The patient underwent successful RA lead repositioning with advancement of LV lead.   He presented to clinic last Friday, 05/27/15, for evaluation given recent issues with hypotension. He was seen in the [facility] ED 05/19/15. Per provider notes, his device was interrogated and functioning properly. He was noted to have normal BP in the ED and orthostatic vitals were negative. Dr. Regenia Skeeter, ED MD, discussed with Dr. Irish Lack, who recommended discontinuation of metoprolol.   When I evaluated the patient in clinic on 3/31, he was hypotensive with a pressure of 75/47. Repeat BP was 78/44. EKG showed  normal rhythm with rate of 65 bpm. He was mentating well, but stated that he felt poorly with weakness and fatigue. No syncope/ near syncope. No dyspnea or CP. No n/v/d. He noted decreased PO intake over the last several days. He did refrain from metoprolol but did take his losartan, lasix and doxazosin earlier that day. His physical exam also supported the likelihood of dehydration with evidence of decreased skin turgor.  He was also seen by DOD, Dr. Acie Fredrickson, that day and decision was made to attempt to manage as an outpatient. He was instructed to hold his lasix, losartan and doxazosin to allow for increase in BP. He was also instructed to increase PO intake with fluids. He was also encouraged to eat chicken noodle soup which contains sodium and protein. The patient and his caregiver both felt comfortable with this approach. They were instructed to go to the ER if no improvement.   He presents back to clinic today for f/u. BP is 122/70. Pulse rate is 52 bpm. He feels better but still trying to get over a cold. No dizziness. No weight gain, edema or dyspnea while off lasix. His caregiver reports that she restarted 1/2 tablet (12.5 mg) of Losartan yesterday as his BP had increased to the 140s. He has tolerated this well. He did take a 1/2 tablet this morning w/o issues. He dose have concerns with his prostate. Since being off of the doxazosin he has noticed weak urinary stream and incomplete voiding.   Current Outpatient Prescriptions  Medication Sig Dispense Refill  . amiodarone (PACERONE)  200 MG tablet Take 0.5 tablets (100 mg total) by mouth daily. 90 tablet 3  . amoxicillin (AMOXIL) 500 MG capsule Take 2,000 mg by mouth See admin instructions. Take 4 capsules (2000 mg) by mouth one hour prior to dental appointments    . bicalutamide (CASODEX) 50 MG tablet Take 50 mg by mouth daily.   3  . clopidogrel (PLAVIX) 75 MG tablet TAKE 1 TABLET(75 MG) BY MOUTH DAILY 30 tablet 8  . docusate sodium (COLACE) 100  MG capsule Take 100 mg by mouth daily.    . dorzolamide-timolol (COSOPT) 22.3-6.8 MG/ML ophthalmic solution Place 1 drop into both eyes 2 (two) times daily.   2  . finasteride (PROSCAR) 5 MG tablet Take 5 mg by mouth daily.    . fluticasone (FLONASE) 50 MCG/ACT nasal spray Place 2 sprays into both nostrils daily as needed for allergies.   1  . lipase/protease/amylase (CREON) 36000 UNITS CPEP capsule Take 36,000 Units by mouth 3 (three) times daily before meals.    [name] LORazepam (ATIVAN) 0.5 MG tablet Take 0.5 mg by mouth every 8 (eight) hours as needed for anxiety.     [name] losartan (COZAAR) 25 MG tablet Take 12.5 mg by mouth daily.    . Multiple Vitamin (MULTIVITAMIN WITH MINERALS) TABS tablet Take 1 tablet by mouth daily. Centrum Silver    . nitroGLYCERIN (NITROSTAT) 0.4 MG SL tablet Place 1 tablet (0.4 mg total) under the tongue every 5 (five) minutes as needed for chest pain. 20 tablet 0  . Omega 3-Lutein-Zeaxanthin (ADVANCED EYE HEALTH PO) Take 1 tablet by mouth daily with lunch.     . ondansetron (ZOFRAN) 4 MG tablet Take 1 tablet by mouth every 6 (six) hours as needed for nausea.   0  . OVER THE COUNTER MEDICATION Take 2 capsules by mouth 2 (two) times daily. Omega XL    . Probiotic Product (PROBIOTIC PO) Take 2 tablets by mouth daily.    . psyllium (METAMUCIL) 58.6 % packet Take 1 packet by mouth daily as needed (for constipation).     . rosuvastatin (CRESTOR) 20 MG tablet Take 20 mg by mouth at bedtime.  5  . traMADol (ULTRAM) 50 MG tablet Take 50 mg by mouth daily.      No current facility-administered medications for this visit.    Allergies  Allergen Reactions  . Protonix [Pantoprazole Sodium] Other (See Comments)    Gi upset, insomnia  . Codeine Anxiety and Other (See Comments)    Insomnia/ hyper  . Prilosec [Omeprazole] Nausea And Vomiting and Other (See Comments)    GI upset, insomnia   . Warfarin Sodium Anxiety and Other (See Comments)    Hx gi bleed     Social History    Social History  . Marital Status: Widowed    Spouse Name: N/A  . Number of Children: N/A  . Years of Education: N/A   Occupational History  . Not on file.   Social History Main Topics  . Smoking status: Former Smoker -- 1.00 packs/day for 14 years    Types: Cigarettes    Quit date: 01/05/1959  . Smokeless tobacco: Never Used  . Alcohol Use: 0.0 oz/week     Comment: 02/09/2014 "I'm not drinking anymore"  . Drug Use: No  . Sexual Activity: No   Other Topics Concern  . Not on file   Social History Narrative     Review of Systems: General: negative for chills, fever, night sweats or weight changes.  Cardiovascular: negative for chest pain, dyspnea on exertion, edema, orthopnea, palpitations, paroxysmal nocturnal dyspnea or shortness of breath Dermatological: negative for rash Respiratory: negative for cough or wheezing Urologic: negative for hematuria Abdominal: negative for nausea, vomiting, diarrhea, bright red blood per rectum, melena, or hematemesis Neurologic: negative for visual changes, syncope, or dizziness All other systems reviewed and are otherwise negative except as noted above.    Blood pressure 122/70, pulse 52, height 5\' 8"  (1.727 m), weight 192 lb 12.8 oz (87.454 kg), SpO2 97 %.  General appearance: alert, cooperative and no distress Neck: no carotid bruit and no JVD Lungs: clear to auscultation bilaterally Heart: regular rate and rhythm, S1, S2 normal, no murmur, click, rub or gallop Extremities: no LEE Pulses: 2+ and symmetric Skin: warm and dry Neurologic: Grossly normal  EKG not performed  ASSESSMENT AND PLAN:   1. Hypotension: BP has improved but did spike to the 0000000 systolic at home, prompting his caregiver to restart his losartan at a low dose, 12.5 mg daily. He has been back on losartan for the past 2 days and seems to be tolerating well. No further hypotension and symptoms have improved. BP is 122/70 in clinic. His volume has remained  stable w/o signs of fluid retention. Given this, we will only have him take his lasix + supplemental K based on daily weights. Patient advised to check weight daily and to take lasix + K if >3 lb weight gain in 24 hrs. His dry weight at home, unclothed, is 185lb. He has also had issues with urination since being off of his doxazosin (h/o prostate CA). Thus we will try restarting this at night. His caregiver will monitor his BP closely. We will continue to refrain from metoprolol for now.  PLAN  Patient has not seen Dr. Irish Lack in over 1 year. I have recommended f/u in 3 months.   Akeiba Axelson PA-C 05/31/2015 8:21 AM

## 2015-05-31 NOTE — Patient Instructions (Signed)
Medication Instructions:  1) RESTART your Cardura (Doxazosin) 4mg  once daily at night 2) You may continue your Losartan 12.5mg  once daily. 3) Stay OFF of your Metoprolol 4) TAKE Furosemide 20mg  once daily for a weight gain of 3 pounds in 24 hours or 5 pounds in one week.  Contact our office if this occurs. 5)  If you take Furosemide, please take Potassium 7mEq as well.  Labwork: None  Testing/Procedures: None  Follow-Up: Your physician recommends that you schedule a follow-up appointment in: 3-4 months with Dr. Irish Lack.   Any Other Special Instructions Will Be Listed Below (If Applicable).     If you need a refill on your cardiac medications before your next appointment, please call your pharmacy.

## 2015-06-06 DIAGNOSIS — K921 Melena: Secondary | ICD-10-CM | POA: Diagnosis not present

## 2015-06-06 DIAGNOSIS — R0602 Shortness of breath: Secondary | ICD-10-CM | POA: Diagnosis not present

## 2015-06-06 DIAGNOSIS — R5382 Chronic fatigue, unspecified: Secondary | ICD-10-CM | POA: Diagnosis not present

## 2015-06-17 ENCOUNTER — Other Ambulatory Visit: Payer: Self-pay | Admitting: Internal Medicine

## 2015-06-17 DIAGNOSIS — H02834 Dermatochalasis of left upper eyelid: Secondary | ICD-10-CM | POA: Diagnosis not present

## 2015-06-17 DIAGNOSIS — H401112 Primary open-angle glaucoma, right eye, moderate stage: Secondary | ICD-10-CM | POA: Diagnosis not present

## 2015-06-17 DIAGNOSIS — H02831 Dermatochalasis of right upper eyelid: Secondary | ICD-10-CM | POA: Diagnosis not present

## 2015-06-17 DIAGNOSIS — H34832 Tributary (branch) retinal vein occlusion, left eye, with macular edema: Secondary | ICD-10-CM | POA: Diagnosis not present

## 2015-06-17 DIAGNOSIS — H02832 Dermatochalasis of right lower eyelid: Secondary | ICD-10-CM | POA: Diagnosis not present

## 2015-06-17 DIAGNOSIS — H01021 Squamous blepharitis right upper eyelid: Secondary | ICD-10-CM | POA: Diagnosis not present

## 2015-06-17 DIAGNOSIS — H532 Diplopia: Secondary | ICD-10-CM | POA: Diagnosis not present

## 2015-06-17 DIAGNOSIS — H401123 Primary open-angle glaucoma, left eye, severe stage: Secondary | ICD-10-CM | POA: Diagnosis not present

## 2015-06-17 DIAGNOSIS — H02835 Dermatochalasis of left lower eyelid: Secondary | ICD-10-CM | POA: Diagnosis not present

## 2015-06-21 ENCOUNTER — Ambulatory Visit
Admission: RE | Admit: 2015-06-21 | Discharge: 2015-06-21 | Disposition: A | Discharge status: Home / Self Care | Payer: Commercial Managed Care - HMO | Source: Ambulatory Visit | Attending: Internal Medicine | Admitting: Internal Medicine

## 2015-06-21 DIAGNOSIS — H532 Diplopia: Secondary | ICD-10-CM

## 2015-06-21 DIAGNOSIS — J349 Unspecified disorder of nose and nasal sinuses: Secondary | ICD-10-CM | POA: Diagnosis not present

## 2015-06-21 DIAGNOSIS — R51 Headache: Secondary | ICD-10-CM | POA: Diagnosis not present

## 2015-06-21 DIAGNOSIS — D329 Benign neoplasm of meninges, unspecified: Secondary | ICD-10-CM | POA: Diagnosis not present

## 2015-06-21 MED ORDER — IOPAMIDOL (ISOVUE-300) INJECTION 61%
75.0000 mL | Freq: Once | INTRAVENOUS | Status: AC | PRN
  Administered 2015-06-21: 75 mL via INTRAVENOUS

## 2015-06-30 ENCOUNTER — Encounter: Payer: Commercial Managed Care - HMO | Admitting: Internal Medicine

## 2015-07-07 DIAGNOSIS — M419 Scoliosis, unspecified: Secondary | ICD-10-CM | POA: Diagnosis not present

## 2015-07-07 DIAGNOSIS — M47816 Spondylosis without myelopathy or radiculopathy, lumbar region: Secondary | ICD-10-CM | POA: Diagnosis not present

## 2015-07-11 DIAGNOSIS — Z923 Personal history of irradiation: Secondary | ICD-10-CM | POA: Diagnosis not present

## 2015-07-11 DIAGNOSIS — D320 Benign neoplasm of cerebral meninges: Secondary | ICD-10-CM | POA: Diagnosis not present

## 2015-07-11 DIAGNOSIS — Z950 Presence of cardiac pacemaker: Secondary | ICD-10-CM | POA: Diagnosis not present

## 2015-07-11 DIAGNOSIS — D329 Benign neoplasm of meninges, unspecified: Secondary | ICD-10-CM | POA: Diagnosis not present

## 2015-07-15 ENCOUNTER — Other Ambulatory Visit: Payer: Self-pay | Admitting: Interventional Cardiology

## 2015-07-15 MED ORDER — POTASSIUM CHLORIDE ER 10 MEQ PO TBCR: 10.0000 meq | EXTENDED_RELEASE_TABLET | Freq: Every day | ORAL | Status: DC | PRN

## 2015-07-15 MED ORDER — LOSARTAN POTASSIUM 25 MG PO TABS: 12.5000 mg | ORAL_TABLET | Freq: Every day | ORAL | Status: DC

## 2015-07-15 MED ORDER — CLOPIDOGREL BISULFATE 75 MG PO TABS: ORAL_TABLET | ORAL | Status: DC

## 2015-07-15 MED ORDER — AMIODARONE HCL 200 MG PO TABS: 100.0000 mg | ORAL_TABLET | Freq: Every day | ORAL | Status: DC

## 2015-07-15 MED ORDER — FUROSEMIDE 20 MG PO TABS: 20.0000 mg | ORAL_TABLET | Freq: Every day | ORAL | Status: DC | PRN

## 2015-07-21 DIAGNOSIS — M545 Low back pain: Secondary | ICD-10-CM | POA: Diagnosis not present

## 2015-07-21 DIAGNOSIS — F322 Major depressive disorder, single episode, severe without psychotic features: Secondary | ICD-10-CM | POA: Diagnosis not present

## 2015-07-21 DIAGNOSIS — F419 Anxiety disorder, unspecified: Secondary | ICD-10-CM | POA: Diagnosis not present

## 2015-07-21 DIAGNOSIS — M47816 Spondylosis without myelopathy or radiculopathy, lumbar region: Secondary | ICD-10-CM | POA: Diagnosis not present

## 2015-07-21 DIAGNOSIS — I2510 Atherosclerotic heart disease of native coronary artery without angina pectoris: Secondary | ICD-10-CM | POA: Diagnosis not present

## 2015-07-21 DIAGNOSIS — I4891 Unspecified atrial fibrillation: Secondary | ICD-10-CM | POA: Diagnosis not present

## 2015-07-21 DIAGNOSIS — M519 Unspecified thoracic, thoracolumbar and lumbosacral intervertebral disc disorder: Secondary | ICD-10-CM | POA: Diagnosis not present

## 2015-07-21 DIAGNOSIS — R269 Unspecified abnormalities of gait and mobility: Secondary | ICD-10-CM | POA: Diagnosis not present

## 2015-07-21 DIAGNOSIS — N183 Chronic kidney disease, stage 3 (moderate): Secondary | ICD-10-CM | POA: Diagnosis not present

## 2015-07-21 DIAGNOSIS — I10 Essential (primary) hypertension: Secondary | ICD-10-CM | POA: Diagnosis not present

## 2015-07-29 DIAGNOSIS — I4891 Unspecified atrial fibrillation: Secondary | ICD-10-CM | POA: Diagnosis not present

## 2015-07-29 DIAGNOSIS — R2689 Other abnormalities of gait and mobility: Secondary | ICD-10-CM | POA: Diagnosis not present

## 2015-07-29 DIAGNOSIS — I129 Hypertensive chronic kidney disease with stage 1 through stage 4 chronic kidney disease, or unspecified chronic kidney disease: Secondary | ICD-10-CM | POA: Diagnosis not present

## 2015-07-29 DIAGNOSIS — N183 Chronic kidney disease, stage 3 (moderate): Secondary | ICD-10-CM | POA: Diagnosis not present

## 2015-07-29 DIAGNOSIS — M4806 Spinal stenosis, lumbar region: Secondary | ICD-10-CM | POA: Diagnosis not present

## 2015-07-31 DIAGNOSIS — I129 Hypertensive chronic kidney disease with stage 1 through stage 4 chronic kidney disease, or unspecified chronic kidney disease: Secondary | ICD-10-CM | POA: Diagnosis not present

## 2015-07-31 DIAGNOSIS — N183 Chronic kidney disease, stage 3 (moderate): Secondary | ICD-10-CM | POA: Diagnosis not present

## 2015-07-31 DIAGNOSIS — M4806 Spinal stenosis, lumbar region: Secondary | ICD-10-CM | POA: Diagnosis not present

## 2015-07-31 DIAGNOSIS — I4891 Unspecified atrial fibrillation: Secondary | ICD-10-CM | POA: Diagnosis not present

## 2015-07-31 DIAGNOSIS — R2689 Other abnormalities of gait and mobility: Secondary | ICD-10-CM | POA: Diagnosis not present

## 2015-08-03 DIAGNOSIS — I4891 Unspecified atrial fibrillation: Secondary | ICD-10-CM | POA: Diagnosis not present

## 2015-08-03 DIAGNOSIS — R2689 Other abnormalities of gait and mobility: Secondary | ICD-10-CM | POA: Diagnosis not present

## 2015-08-03 DIAGNOSIS — I129 Hypertensive chronic kidney disease with stage 1 through stage 4 chronic kidney disease, or unspecified chronic kidney disease: Secondary | ICD-10-CM | POA: Diagnosis not present

## 2015-08-03 DIAGNOSIS — M4806 Spinal stenosis, lumbar region: Secondary | ICD-10-CM | POA: Diagnosis not present

## 2015-08-03 DIAGNOSIS — N183 Chronic kidney disease, stage 3 (moderate): Secondary | ICD-10-CM | POA: Diagnosis not present

## 2015-08-08 DIAGNOSIS — H01025 Squamous blepharitis left lower eyelid: Secondary | ICD-10-CM | POA: Diagnosis not present

## 2015-08-08 DIAGNOSIS — H401112 Primary open-angle glaucoma, right eye, moderate stage: Secondary | ICD-10-CM | POA: Diagnosis not present

## 2015-08-08 DIAGNOSIS — H01021 Squamous blepharitis right upper eyelid: Secondary | ICD-10-CM | POA: Diagnosis not present

## 2015-08-08 DIAGNOSIS — H01022 Squamous blepharitis right lower eyelid: Secondary | ICD-10-CM | POA: Diagnosis not present

## 2015-08-08 DIAGNOSIS — H532 Diplopia: Secondary | ICD-10-CM | POA: Diagnosis not present

## 2015-08-08 DIAGNOSIS — H401123 Primary open-angle glaucoma, left eye, severe stage: Secondary | ICD-10-CM | POA: Diagnosis not present

## 2015-08-08 DIAGNOSIS — H01024 Squamous blepharitis left upper eyelid: Secondary | ICD-10-CM | POA: Diagnosis not present

## 2015-08-10 DIAGNOSIS — R2689 Other abnormalities of gait and mobility: Secondary | ICD-10-CM | POA: Diagnosis not present

## 2015-08-10 DIAGNOSIS — M4806 Spinal stenosis, lumbar region: Secondary | ICD-10-CM | POA: Diagnosis not present

## 2015-08-10 DIAGNOSIS — I4891 Unspecified atrial fibrillation: Secondary | ICD-10-CM | POA: Diagnosis not present

## 2015-08-10 DIAGNOSIS — N183 Chronic kidney disease, stage 3 (moderate): Secondary | ICD-10-CM | POA: Diagnosis not present

## 2015-08-10 DIAGNOSIS — I129 Hypertensive chronic kidney disease with stage 1 through stage 4 chronic kidney disease, or unspecified chronic kidney disease: Secondary | ICD-10-CM | POA: Diagnosis not present

## 2015-08-11 DIAGNOSIS — M961 Postlaminectomy syndrome, not elsewhere classified: Secondary | ICD-10-CM | POA: Diagnosis not present

## 2015-08-11 DIAGNOSIS — M7062 Trochanteric bursitis, left hip: Secondary | ICD-10-CM | POA: Diagnosis not present

## 2015-08-11 DIAGNOSIS — M5416 Radiculopathy, lumbar region: Secondary | ICD-10-CM | POA: Diagnosis not present

## 2015-08-12 DIAGNOSIS — I4891 Unspecified atrial fibrillation: Secondary | ICD-10-CM | POA: Diagnosis not present

## 2015-08-12 DIAGNOSIS — N183 Chronic kidney disease, stage 3 (moderate): Secondary | ICD-10-CM | POA: Diagnosis not present

## 2015-08-12 DIAGNOSIS — I129 Hypertensive chronic kidney disease with stage 1 through stage 4 chronic kidney disease, or unspecified chronic kidney disease: Secondary | ICD-10-CM | POA: Diagnosis not present

## 2015-08-12 DIAGNOSIS — M4806 Spinal stenosis, lumbar region: Secondary | ICD-10-CM | POA: Diagnosis not present

## 2015-08-12 DIAGNOSIS — R2689 Other abnormalities of gait and mobility: Secondary | ICD-10-CM | POA: Diagnosis not present

## 2015-08-22 ENCOUNTER — Ambulatory Visit (INDEPENDENT_AMBULATORY_CARE_PROVIDER_SITE_OTHER): Payer: Commercial Managed Care - HMO | Admitting: *Deleted

## 2015-08-22 ENCOUNTER — Telehealth: Payer: Self-pay | Admitting: Internal Medicine

## 2015-08-22 ENCOUNTER — Encounter: Payer: Self-pay | Admitting: Internal Medicine

## 2015-08-22 DIAGNOSIS — I480 Paroxysmal atrial fibrillation: Secondary | ICD-10-CM | POA: Diagnosis not present

## 2015-08-22 DIAGNOSIS — I5022 Chronic systolic (congestive) heart failure: Secondary | ICD-10-CM

## 2015-08-22 DIAGNOSIS — R001 Bradycardia, unspecified: Secondary | ICD-10-CM | POA: Diagnosis not present

## 2015-08-22 NOTE — Telephone Encounter (Signed)
error 

## 2015-08-22 NOTE — Telephone Encounter (Signed)
°  1. Has your device fired? No, she stats its kicking 2. Is you device beeping? no  3. Are you experiencing draining or swelling at device site? no 4. Are you calling to see if we received your device transmission? no 5. Have you passed out? no

## 2015-08-22 NOTE — Telephone Encounter (Signed)
Appt made for today at 1330. Patient voiced understanding.

## 2015-08-23 ENCOUNTER — Telehealth: Payer: Self-pay | Admitting: Interventional Cardiology

## 2015-08-23 LAB — CUP PACEART INCLINIC DEVICE CHECK
Battery Remaining Longevity: 96 mo
Battery Voltage: 2.99 V
Brady Statistic RA Percent Paced: 17 %
Date Time Interrogation Session: 20170626173729
Implantable Lead Implant Date: 20161205
Implantable Lead Implant Date: 20161205
Implantable Lead Implant Date: 20161205
Implantable Lead Location: 753858
Implantable Lead Location: 753860
Lead Channel Impedance Value: 1087.5 Ohm
Lead Channel Impedance Value: 462.5 Ohm
Lead Channel Impedance Value: 487.5 Ohm
Lead Channel Pacing Threshold Amplitude: 0.75 V
Lead Channel Pacing Threshold Amplitude: 0.75 V
Lead Channel Pacing Threshold Amplitude: 0.75 V
Lead Channel Pacing Threshold Pulse Width: 0.5 ms
Lead Channel Pacing Threshold Pulse Width: 0.5 ms
Lead Channel Setting Pacing Amplitude: 2.25 V
Lead Channel Setting Pacing Amplitude: 2.5 V
Lead Channel Setting Pacing Pulse Width: 0.5 ms
Lead Channel Setting Sensing Sensitivity: 2 mV
MDC IDC LEAD LOCATION: 753859
MDC IDC MSMT LEADCHNL LV PACING THRESHOLD AMPLITUDE: 1.75 V
MDC IDC MSMT LEADCHNL LV PACING THRESHOLD AMPLITUDE: 1.75 V
MDC IDC MSMT LEADCHNL LV PACING THRESHOLD PULSEWIDTH: 0.5 ms
MDC IDC MSMT LEADCHNL RA PACING THRESHOLD AMPLITUDE: 0.75 V
MDC IDC MSMT LEADCHNL RA PACING THRESHOLD PULSEWIDTH: 0.5 ms
MDC IDC MSMT LEADCHNL RA PACING THRESHOLD PULSEWIDTH: 0.5 ms
MDC IDC MSMT LEADCHNL RA SENSING INTR AMPL: 2.6 mV
MDC IDC MSMT LEADCHNL RV PACING THRESHOLD PULSEWIDTH: 0.5 ms
MDC IDC MSMT LEADCHNL RV SENSING INTR AMPL: 12 mV
MDC IDC SET LEADCHNL LV PACING PULSEWIDTH: 0.5 ms
MDC IDC SET LEADCHNL RA PACING AMPLITUDE: 2.75 V
MDC IDC STAT BRADY RV PERCENT PACED: 71 %
Pulse Gen Model: 3262
Pulse Gen Serial Number: 7802901

## 2015-08-23 NOTE — Telephone Encounter (Signed)
Patient returned my call to say that he was still having diaphragmatic stim.   I offered patient an appt for tomorrow with the Device Clnic, but patient declined bc his caregiver is not able to bring him. Appt made for Thursday @ 0900.

## 2015-08-23 NOTE — Telephone Encounter (Signed)
LMTCB//sss 

## 2015-08-23 NOTE — Telephone Encounter (Signed)
F/u Message ° °Pt returning RN call. Please call back to discuss  °

## 2015-08-23 NOTE — Progress Notes (Signed)
CRT-P device check in clinic. Normal device function. Thresholds, sensing, impedance consistent with previous measurements. Diaphragmatic stim noted from M2-Can. LV pacing configuration changed to D1-P4 w/0.5V safety margin (output 2.25V @ 0.57ms)--no stim in multiple positions--ok per GT. Histograms appropriate for patient and level of activity. (78%) AT/AF burden + Amio/ no OAC--h/o GIB. No ventricular high rate episodes. Patient bi-ventricularly pacing 71% of the time. Device programmed with appropriate safety margins. Device heart failure diagnostics are within normal limits and stable over time. Estimated longevity 6.2-7.5 years. Plan to follow up with GT in 3 months.

## 2015-08-25 ENCOUNTER — Ambulatory Visit (INDEPENDENT_AMBULATORY_CARE_PROVIDER_SITE_OTHER): Payer: Commercial Managed Care - HMO | Admitting: *Deleted

## 2015-08-25 ENCOUNTER — Encounter: Payer: Self-pay | Admitting: Internal Medicine

## 2015-08-25 DIAGNOSIS — I5022 Chronic systolic (congestive) heart failure: Secondary | ICD-10-CM | POA: Diagnosis not present

## 2015-08-25 LAB — CUP PACEART INCLINIC DEVICE CHECK
Battery Remaining Longevity: 122.4
Battery Voltage: 2.99 V
Brady Statistic RA Percent Paced: 0.33 %
Date Time Interrogation Session: 20170629105156
Implantable Lead Implant Date: 20161205
Implantable Lead Location: 753859
Implantable Lead Location: 753860
Lead Channel Sensing Intrinsic Amplitude: 12 mV
Lead Channel Setting Pacing Amplitude: 2.5 V
Lead Channel Setting Pacing Amplitude: 2.75 V
Lead Channel Setting Pacing Pulse Width: 0.7 ms
Lead Channel Setting Sensing Sensitivity: 2 mV
MDC IDC LEAD IMPLANT DT: 20161205
MDC IDC LEAD IMPLANT DT: 20161205
MDC IDC LEAD LOCATION: 753858
MDC IDC MSMT LEADCHNL RA SENSING INTR AMPL: 3 mV
MDC IDC SET LEADCHNL LV PACING AMPLITUDE: 0.75 V
MDC IDC SET LEADCHNL RV PACING PULSEWIDTH: 0.5 ms
MDC IDC STAT BRADY RV PERCENT PACED: 28 %
Pulse Gen Model: 3262
Pulse Gen Serial Number: 7802901

## 2015-08-25 NOTE — Progress Notes (Signed)
Asked to evaluate patient while here for evaluation of diaphragmatic stim for uncontrolled V rates in AF and decreased CRT pacing. Device reprogrammed for diaphragmatic stim.  Pt previously unable to tolerate BB with hypotension.  On low dose amiodarone but no anticoagulation 2/2 prior GI bleeding so I am reluctant to increase amiodarone today. AVN ablation is an option but with narrow margins of LV threshold and diaphragmatic stim, would need to be carefully considered. Appt made with [name] next week to discuss further. No med changes made today.  Base rate increased to 75 to promote CRT pacing until that visit.   Chanetta Marshall, NP 08/25/2015 10:46 AM

## 2015-09-01 ENCOUNTER — Ambulatory Visit (INDEPENDENT_AMBULATORY_CARE_PROVIDER_SITE_OTHER): Payer: Commercial Managed Care - HMO | Admitting: Internal Medicine

## 2015-09-01 ENCOUNTER — Encounter (INDEPENDENT_AMBULATORY_CARE_PROVIDER_SITE_OTHER): Payer: Self-pay

## 2015-09-01 ENCOUNTER — Encounter: Payer: Self-pay | Admitting: Internal Medicine

## 2015-09-01 VITALS — BP 92/62 | HR 79 | Ht 68.0 in | Wt 186.2 lb

## 2015-09-01 DIAGNOSIS — I5022 Chronic systolic (congestive) heart failure: Secondary | ICD-10-CM | POA: Diagnosis not present

## 2015-09-01 LAB — CUP PACEART INCLINIC DEVICE CHECK
Battery Remaining Longevity: 84 mo
Battery Voltage: 2.99 V
Date Time Interrogation Session: 20170706173343
Implantable Lead Location: 753859
Lead Channel Pacing Threshold Pulse Width: 0.5 ms
Lead Channel Pacing Threshold Pulse Width: 1 ms
Lead Channel Setting Pacing Amplitude: 2.5 V
Lead Channel Setting Pacing Amplitude: 2.75 V
Lead Channel Setting Pacing Pulse Width: 0.5 ms
Lead Channel Setting Pacing Pulse Width: 1 ms
MDC IDC LEAD IMPLANT DT: 20161205
MDC IDC LEAD IMPLANT DT: 20161205
MDC IDC LEAD IMPLANT DT: 20161205
MDC IDC LEAD LOCATION: 753858
MDC IDC LEAD LOCATION: 753860
MDC IDC MSMT LEADCHNL LV IMPEDANCE VALUE: 487.5 Ohm
MDC IDC MSMT LEADCHNL LV PACING THRESHOLD AMPLITUDE: 1.5 V
MDC IDC MSMT LEADCHNL RA IMPEDANCE VALUE: 475 Ohm
MDC IDC MSMT LEADCHNL RA SENSING INTR AMPL: 0.9 mV
MDC IDC MSMT LEADCHNL RV IMPEDANCE VALUE: 450 Ohm
MDC IDC MSMT LEADCHNL RV PACING THRESHOLD AMPLITUDE: 0.75 V
MDC IDC MSMT LEADCHNL RV SENSING INTR AMPL: 12 mV
MDC IDC SET LEADCHNL RV PACING AMPLITUDE: 2.5 V
MDC IDC SET LEADCHNL RV SENSING SENSITIVITY: 2 mV
MDC IDC STAT BRADY RA PERCENT PACED: 4.7 %
MDC IDC STAT BRADY RV PERCENT PACED: 66 %
Pulse Gen Model: 3262
Pulse Gen Serial Number: 7802901

## 2015-09-01 MED ORDER — AMIODARONE HCL 200 MG PO TABS: 400.0000 mg | ORAL_TABLET | Freq: Every day | ORAL | Status: DC

## 2015-09-01 MED ORDER — APIXABAN 2.5 MG PO TABS: 2.5000 mg | ORAL_TABLET | Freq: Two times a day (BID) | ORAL | Status: DC

## 2015-09-01 NOTE — Progress Notes (Signed)
HPI [name] returns today for followup. I saw him in the office several months ago. He is s/p BiV PPM insertion and has developed worsening atrial fib with his symptoms mostly that of sob and fatigue. He cannot take warfarin because of a h/o GI bleeding. He is not on any AV nodal blocking drugs due to low blood pressure. His rates have been increased and he has had some problem with diaghragmatic stimulation. His symptoms remain class 3.   Allergies  Allergen Reactions  . Protonix [Pantoprazole Sodium] Other (See Comments)    Gi upset, insomnia  . Codeine Anxiety and Other (See Comments)    Insomnia/ hyper  . Prilosec [Omeprazole] Nausea And Vomiting and Other (See Comments)    GI upset, insomnia   . Warfarin Sodium Anxiety and Other (See Comments)    Hx gi bleed      Current Outpatient Prescriptions  Medication Sig Dispense Refill  . acetaminophen (TYLENOL) 500 MG tablet Take 500-1,000 mg by mouth every 6 (six) hours as needed (pain).     [name] amiodarone (PACERONE) 200 MG tablet Take 0.5 tablets (100 mg total) by mouth daily. 45 tablet 3  . amoxicillin (AMOXIL) 500 MG capsule Take 2,000 mg by mouth See admin instructions. Take 4 capsules (2000 mg) by mouth one hour prior to dental appointments    . bicalutamide (CASODEX) 50 MG tablet Take 50 mg by mouth daily.   3  . clopidogrel (PLAVIX) 75 MG tablet TAKE 1 TABLET(75 MG) BY MOUTH DAILY 90 tablet 3  . docusate sodium (COLACE) 100 MG capsule Take 100 mg by mouth daily.    . dorzolamide-timolol (COSOPT) 22.3-6.8 MG/ML ophthalmic solution Place 1 drop into both eyes 2 (two) times daily.   2  . doxazosin (CARDURA) 4 MG tablet Take 1 tablet (4 mg total) by mouth daily. 90 tablet 3  . finasteride (PROSCAR) 5 MG tablet Take 5 mg by mouth daily.    . fluticasone (FLONASE) 50 MCG/ACT nasal spray Place 2 sprays into both nostrils daily as needed for allergies.   1  . furosemide (LASIX) 20 MG tablet Take 20 mg by mouth every other day.     [name] LORazepam (ATIVAN) 0.5 MG tablet Take 0.5 mg by mouth every 8 (eight) hours as needed for anxiety.     [name] losartan (COZAAR) 25 MG tablet Take 0.5 tablets (12.5 mg total) by mouth daily. 45 tablet 3  . Multiple Vitamin (MULTIVITAMIN WITH MINERALS) TABS tablet Take 1 tablet by mouth daily. Centrum Silver    . nitroGLYCERIN (NITROSTAT) 0.4 MG SL tablet Place 1 tablet (0.4 mg total) under the tongue every 5 (five) minutes as needed for chest pain. 20 tablet 0  . Omega 3-Lutein-Zeaxanthin (ADVANCED EYE HEALTH PO) Take 1 tablet by mouth daily with lunch.     . ondansetron (ZOFRAN) 4 MG tablet Take 1 tablet by mouth every 6 (six) hours as needed for nausea.   0  . OVER THE COUNTER MEDICATION Take 2 capsules by mouth 2 (two) times daily. Omega XL    . potassium chloride (K-DUR) 10 MEQ tablet Take 10 mEq by mouth every other day.    . Probiotic Product (PROBIOTIC PO) Take 2 tablets by mouth daily.    . psyllium (METAMUCIL) 58.6 % packet Take 1 packet by mouth daily as needed (for constipation).     . tamsulosin (FLOMAX) 0.4 MG CAPS capsule Take 0.4 mg by mouth daily after supper.    [name]  traMADol (ULTRAM) 50 MG tablet Take 50 mg by mouth 2 (two) times daily.      No current facility-administered medications for this visit.     Past Medical History  Diagnosis Date  . PAF (paroxysmal atrial fibrillation) (HCC)     not on coumadin due to hx of GI bleed.  . Mitral regurgitation     a. Mild-mod by echo 12/2014.  [name] GERD (gastroesophageal reflux disease)     occ. take prevacid  . Meningioma ([place]) right -sided w/ right VI palsy    followed by dr Gaynell Face  . Diverticulitis of colon with bleeding     s/p sigmoid resection '88  . History of GI diverticular bleed april 2012    transfused blood and resolved without surgical intervention  . Impotence, organic     s/p penile prosthesis 1990's  . BPH (benign prostatic hypertrophy) with urinary obstruction     s/p turp yrs ago  . Blood transfusion      "related to a surgery"  . Impaired hearing bilateral     only left hearing aid  . Bradycardia     a. Amio d/c'd 08/2013; brady arrest 08/2013 after PCI >>> recurrent AF >>> Amiodarone restarted. b. Pacemaker being considered in 11/2014.  . Cardiomyopathy ([place])     a. Echo (08/2013):  EF 30-35%, AS hypokinesis, Gr 1 diast dysfn, mild MR, mild LAE >>> b. improved EF 50-55% by echo 8/15. c. EF down again by echo 12/2014 to 30-35% but 51% by nuc.  [name] CAD (coronary artery disease)     a. s/p MI and prior PCI of LAD;  b. LHC (08/2013):  prox LAD 60-70%, mid LAD stents ok, ostial lesion at Dx jailed by stent, mild CFX and RCA disesase >>>  PCI (09/08/13):  rotational atherectomy + Promus DES to prox LAD  . Prostate cancer ([place]) 11/30/13    Gleason 8, volume 22.14 cc  . Anxiety   . Depression   . Hypertension   . Chronic systolic CHF (congestive heart failure) ([place])   . Myocardial infarction [facility]) 1980's- medical intervention    "so mild I didn't know I'd had it"  . Sleep apnea     non-compliant cpap  . DJD (degenerative joint disease) hips and knees    s/p bilateral total replacements  . DDD (degenerative disc disease)     chronic back pain  . Arthritis   . Chronic lower back pain   . Incomplete bladder emptying   . LBBB (left bundle branch block)   . CKD (chronic kidney disease), stage III     a. Per review of labs baseline Cr 1.1-1.3.    ROS:   All systems reviewed and negative except as noted in the HPI.   Past Surgical History  Procedure Laterality Date  . Cholecystectomy    . Total hip arthroplasty Right 03-25-08--  . Total hip arthroplasty Left 2005  . Total knee arthroplasty Right 2004  . Total knee arthroplasty Left 1997  . Inguinal hernia repair Bilateral   . Cataract extraction w/ intraocular lens  implant, bilateral Bilateral ~ 2000  . Appendectomy    . Transurethral resection of prostate  "years ago"  . Penile prosthesis implant  1990's  . Shoulder open rotator cuff  repair Left   . Inner ear surgery Right yrs ago    "trying to get my hearing back  . Cardiac catheterization  2007    noncritical cad (results w/ chart)  . Coronary angioplasty  with stent placement  08-03-08    drug-eluting stent x2 distal and mid lad  . Transurethral resection of prostate  01/08/2011    Procedure: TRANSURETHRAL RESECTION OF THE PROSTATE (TURP);  Surgeon: Franchot Gallo;  Location: [place];  Service: Urology;  Laterality: N/A;  GYRUS   . Prostate biopsy  11/30/13    Gleason 8, vol 22.14 cc  . Gamma knife radiation  2000    [facility] for meningioma, last eval 2013- no change  . Left heart catheterization with coronary angiogram N/A 09/04/2013    Procedure: LEFT HEART CATHETERIZATION WITH CORONARY ANGIOGRAM;  Surgeon: Jettie Booze, MD;  Location: [facility] CATH LAB;  Service: Cardiovascular;  Laterality: N/A;  . Percutaneous coronary rotoblator intervention (pci-r) N/A 09/08/2013    Procedure: PERCUTANEOUS CORONARY ROTOBLATOR INTERVENTION (PCI-R);  Surgeon: Jettie Booze, MD;  Location: [facility] CATH LAB;  Service: Cardiovascular;  Laterality: N/A;  . Revision total knee arthroplasty Right   . Total hip revision Right 3-4 times  . Closed reduction hip dislocation Right "several"  . Esophagogastroduodenoscopy N/A 02/11/2014    Procedure: ESOPHAGOGASTRODUODENOSCOPY (EGD);  Surgeon: Cleotis Nipper, MD;  Location: [facility] ENDOSCOPY;  Service: Endoscopy;  Laterality: N/A;  . Ep implantable device N/A 01/31/2015    Procedure: BiV Pacemaker Insertion CRT-P;  Surgeon: Evans Lance, MD;  Location: [place] CV LAB;  Service: Cardiovascular;  Laterality: N/A;  . Ep implantable device N/A 02/07/2015    Procedure: Lead Revision/Repair;  Surgeon: Deboraha Sprang, MD;  Location: [place] CV LAB;  Service: Cardiovascular;  Laterality: N/A;     Family History  Problem Relation Age of Onset  . Heart disease Mother   . Heart attack Mother   . Cancer    . Emphysema  Father   . Cancer Brother     liver cancer     Social History   Social History  . Marital Status: Widowed    Spouse Name: N/A  . Number of Children: N/A  . Years of Education: N/A   Occupational History  . Not on file.   Social History Main Topics  . Smoking status: Former Smoker -- 1.00 packs/day for 14 years    Types: Cigarettes    Quit date: 01/05/1959  . Smokeless tobacco: Never Used  . Alcohol Use: 0.0 oz/week     Comment: 02/09/2014 "I'm not drinking anymore"  . Drug Use: No  . Sexual Activity: No   Other Topics Concern  . Not on file   Social History Narrative     BP 92/62 mmHg  Pulse 79  Ht 5\' 8"  (1.727 m)  Wt 186 lb 3.2 oz (84.46 kg)  BMI 28.32 kg/m2  Physical Exam:  Chronically ill appearing elderly man, NAD HEENT: Unremarkable Neck:  7 cm JVD, no thyromegally Back:  No CVA tenderness Lungs:  Clear with no wheezes, rales, or rhonchi. HEART:  IRegular rate rhythm, no murmurs, no rubs, no clicks Abd:  soft, positive bowel sounds, no organomegally, no rebound, no guarding Ext:  2 plus pulses, no edema, no cyanosis, no clubbing Skin:  No rashes no nodules Neuro:  CN II through XII intact, motor grossly intact  PM interogation - pacing less than 65% of the time. He was pacing subthreshold.  Assess/Plan: 1. Chronic systolic heart failure - his symptoms are 3B. I have discussed ways we might improve his symptoms including AV node ablation as well as increasing amiodarone, starting Eliquis in low dose followed by DCCV and then  stopping his Eliquis about 3 weeks after anti-coagulation. He understands that starting eliquis could result in bleeding. He will stop his plavix. 2. Atrial fib - We will try to get him back to NSR by increasing his amio to 400 mg a day for 6 weeks before his DCCV. 3. PM - today we reprogrammed his device to assure capture and avoid diaphragmatic stimulation.  4. Coags/Bleeding - hopefully using low dose eliquis and stopping plavix  will not result in GI bleeding.  Mikle Bosworth.D.

## 2015-09-01 NOTE — Patient Instructions (Addendum)
Medication Instructions:  Your physician has recommended you make the following change in your medication:  1) INCREASE Amiodarone to 400 mg daily   In 3 weeks: 2) STOP Plavix 3) START Eliquis 2.5 mg twice a day    Labwork: None ordered  Testing/Procedures: Your physician has recommended that you have a Cardioversion (DCCV). Electrical Cardioversion uses a jolt of electricity to your heart either through paddles or wired patches attached to your chest. This is a controlled, usually prescheduled, procedure. Defibrillation is done under light anesthesia in the hospital, and you usually go home the day of the procedure. This is done to get your heart back into a normal rhythm. You are not awake for the procedure.   We will call you to arrange this procedure. You will not have this procedure until around the middle of August.  Follow-Up: To be determined once cardioversion has been scheduled/arranged.  If you need a refill on your cardiac medications before your next appointment, please call your pharmacy.  Thank you for choosing [facility]!!      Any Other Special Instructions Will Be Listed Below (If Applicable). - call office if you begin to have black/bloody stools  Electrical Cardioversion Electrical cardioversion is the delivery of a jolt of electricity to change the rhythm of the heart. Sticky patches or metal paddles are placed on the chest to deliver the electricity from a device. This is done to restore a normal rhythm. A rhythm that is too fast or not regular keeps the heart from pumping well. Electrical cardioversion is done in an emergency if:   There is low or no blood pressure as a result of the heart rhythm.   Normal rhythm must be restored as fast as possible to protect the brain and heart from further damage.   It may save a life. Cardioversion may be done for heart rhythms that are not immediately life threatening, such as atrial fibrillation or flutter, in  which:   The heart is beating too fast or is not regular.   Medicine to change the rhythm has not worked.   It is safe to wait in order to allow time for preparation.  Symptoms of the abnormal rhythm are bothersome.  The risk of stroke and other serious problems can be reduced. LET [facility] CARE PROVIDER KNOW ABOUT:   Any allergies you have.  All medicines you are taking, including vitamins, herbs, eye drops, creams, and over-the-counter medicines.  Previous problems you or members of your family have had with the use of anesthetics.   Any blood disorders you have.   Previous surgeries you have had.   Medical conditions you have. RISKS AND COMPLICATIONS  Generally, this is a safe procedure. However, problems can occur and include:   Breathing problems related to the anesthetic used.  A blood clot that breaks free and travels to other parts of your body. This could cause a stroke or other problems. The risk of this is lowered by use of blood-thinning medicine (anticoagulant) prior to the procedure.  Cardiac arrest (rare). BEFORE THE PROCEDURE   You may have tests to detect blood clots in your heart and to evaluate heart function.  You may start taking anticoagulants so your blood does not clot as easily.   Medicines may be given to help stabilize your heart rate and rhythm. PROCEDURE  You will be given medicine through an IV tube to reduce discomfort and make you sleepy (sedative).   An electrical shock will be  delivered. AFTER THE PROCEDURE Your heart rhythm will be watched to make sure it does not change.    This information is not intended to replace advice given to you by your health care provider. Make sure you discuss any questions you have with your health care provider.   Document Released: 02/02/2002 Document Revised: 03/05/2014 Document Reviewed: 08/27/2012 Elsevier Interactive Patient Education [employer].

## 2015-09-07 ENCOUNTER — Telehealth: Payer: Self-pay | Admitting: Internal Medicine

## 2015-09-07 NOTE — Telephone Encounter (Signed)
New Message   Pt wife call..  Pt c/o medication issue:  1. Name of Medication: Amiodarone   2. How are you currently taking this medication (dosage and times per day)? 400mg   3. Are you having a reaction (difficulty breathing--STAT)? Very dizzy, lightheaded, nausea   4. What is your medication issue? Pt wife states med mg was changed from 100 to 400mg  and now pt is not reacting well to change. Pt wife ask if pt need to change dosage. Please call back to discuss

## 2015-09-07 NOTE — Telephone Encounter (Signed)
Left patient a message that I had discussed with Chanetta Marshall, NP and we could try decreasing Amiodarone dose to 300 mg daily to see if this helps but to call me and let me know he got this message.

## 2015-09-15 MED ORDER — AMIODARONE HCL 200 MG PO TABS: 300.0000 mg | ORAL_TABLET | Freq: Every day | ORAL | Status: DC

## 2015-09-15 NOTE — Telephone Encounter (Signed)
Left message for patient again.  I have asked her lower his Amiodarone to 300mg  daily in hopes this helps with his symptoms.  I have asked he call me back if needed

## 2015-09-16 ENCOUNTER — Telehealth: Payer: Self-pay | Admitting: Internal Medicine

## 2015-09-16 NOTE — Telephone Encounter (Signed)
Follow Up:   Pt said he talked to you yesterday,says he want to talk to you right away please.

## 2015-09-16 NOTE — Telephone Encounter (Signed)
I have scheduled for Wed 09/28/15 at 1pm He will need to be at the hospital at 11am for labs NPO after midnight  Do not take Furosemide am of procedure but take all other medications Case # [id number] Patient aware of date and time

## 2015-09-16 NOTE — Telephone Encounter (Signed)
Returned call to patient and he feels terrible.  He is having intermittent dizziness and just feels terrible.  Says, "I can't live like this".  From Dr Tanna Furry note looks like he was to be set up for a DCCV in mid Aug.  I will go ahead and schedule that now.

## 2015-09-19 ENCOUNTER — Encounter: Payer: Self-pay | Admitting: Internal Medicine

## 2015-09-19 ENCOUNTER — Telehealth: Payer: Self-pay | Admitting: Internal Medicine

## 2015-09-19 DIAGNOSIS — H01024 Squamous blepharitis left upper eyelid: Secondary | ICD-10-CM | POA: Diagnosis not present

## 2015-09-19 DIAGNOSIS — H01025 Squamous blepharitis left lower eyelid: Secondary | ICD-10-CM | POA: Diagnosis not present

## 2015-09-19 DIAGNOSIS — H01021 Squamous blepharitis right upper eyelid: Secondary | ICD-10-CM | POA: Diagnosis not present

## 2015-09-19 DIAGNOSIS — H532 Diplopia: Secondary | ICD-10-CM | POA: Diagnosis not present

## 2015-09-19 DIAGNOSIS — H01022 Squamous blepharitis right lower eyelid: Secondary | ICD-10-CM | POA: Diagnosis not present

## 2015-09-19 MED ORDER — APIXABAN 2.5 MG PO TABS: 2.5000 mg | ORAL_TABLET | Freq: Two times a day (BID) | ORAL | 2 refills | Status: DC

## 2015-09-19 NOTE — Telephone Encounter (Signed)
New message     Pt caregiver wants to speak to the nurse. Please call.

## 2015-09-19 NOTE — Telephone Encounter (Signed)
This encounter was created in error - please disregard.

## 2015-09-19 NOTE — Telephone Encounter (Signed)
Iris D Becton 11 minutes ago (2:01 PM)   New message     Pt caregiver wants to speak to the nurse. Please call.   Documentation

## 2015-09-19 NOTE — Telephone Encounter (Addendum)
Spoke with caregiver.  He will stop his Plavix and start Eliquis 2.5 mg twice daily 09/20/15.  DCCV is scheduled for 8/17 with Dr Johnsie Cancel at [facility].  I will have the patient arrive at 11am for labs.  NPO after midnight and will take medications the morning of the procedure with small amount of water.  She is aware he will obtain his labs at the hospital and go home the same day.  They will call me back with any questions or concerns

## 2015-09-19 NOTE — Telephone Encounter (Signed)
New Message  Pt care giver calling to speak w/ rN- stated that due to cardioversion sched for 8/2- wanted to know what to do w/ pt's Eliquis. Please call back and discuss.

## 2015-09-20 ENCOUNTER — Encounter: Payer: Self-pay | Admitting: Internal Medicine

## 2015-09-20 ENCOUNTER — Ambulatory Visit: Payer: Commercial Managed Care - HMO | Admitting: Interventional Cardiology

## 2015-09-21 DIAGNOSIS — C61 Malignant neoplasm of prostate: Secondary | ICD-10-CM | POA: Diagnosis not present

## 2015-09-26 DIAGNOSIS — M47816 Spondylosis without myelopathy or radiculopathy, lumbar region: Secondary | ICD-10-CM | POA: Diagnosis not present

## 2015-09-26 DIAGNOSIS — M545 Low back pain: Secondary | ICD-10-CM | POA: Diagnosis not present

## 2015-09-28 DIAGNOSIS — R351 Nocturia: Secondary | ICD-10-CM | POA: Diagnosis not present

## 2015-09-28 DIAGNOSIS — E291 Testicular hypofunction: Secondary | ICD-10-CM | POA: Diagnosis not present

## 2015-09-28 DIAGNOSIS — N99111 Postprocedural bulbous urethral stricture: Secondary | ICD-10-CM | POA: Diagnosis not present

## 2015-09-28 DIAGNOSIS — C61 Malignant neoplasm of prostate: Secondary | ICD-10-CM | POA: Diagnosis not present

## 2015-10-03 DIAGNOSIS — M47816 Spondylosis without myelopathy or radiculopathy, lumbar region: Secondary | ICD-10-CM | POA: Diagnosis not present

## 2015-10-03 DIAGNOSIS — M545 Low back pain: Secondary | ICD-10-CM | POA: Diagnosis not present

## 2015-10-11 ENCOUNTER — Encounter (INDEPENDENT_AMBULATORY_CARE_PROVIDER_SITE_OTHER): Payer: Commercial Managed Care - HMO | Admitting: Ophthalmology

## 2015-10-12 ENCOUNTER — Other Ambulatory Visit: Payer: Self-pay | Admitting: Internal Medicine

## 2015-10-12 DIAGNOSIS — I4819 Other persistent atrial fibrillation: Secondary | ICD-10-CM

## 2015-10-13 ENCOUNTER — Encounter (HOSPITAL_COMMUNITY): Payer: Self-pay | Admitting: *Deleted

## 2015-10-13 ENCOUNTER — Ambulatory Visit (HOSPITAL_COMMUNITY): Payer: Commercial Managed Care - HMO | Admitting: Certified Registered"

## 2015-10-13 ENCOUNTER — Ambulatory Visit (HOSPITAL_COMMUNITY)
Admission: RE | Admit: 2015-10-13 | Discharge: 2015-10-13 | Disposition: A | Discharge status: Home / Self Care | Payer: Commercial Managed Care - HMO | Source: Ambulatory Visit | Attending: Cardiovascular Disease | Admitting: Cardiovascular Disease

## 2015-10-13 ENCOUNTER — Encounter (HOSPITAL_COMMUNITY)
Admission: RE | Disposition: A | Discharge status: Home / Self Care | Payer: Self-pay | Source: Ambulatory Visit | Attending: Cardiovascular Disease

## 2015-10-13 DIAGNOSIS — Z7901 Long term (current) use of anticoagulants: Secondary | ICD-10-CM | POA: Diagnosis not present

## 2015-10-13 DIAGNOSIS — Z96653 Presence of artificial knee joint, bilateral: Secondary | ICD-10-CM | POA: Insufficient documentation

## 2015-10-13 DIAGNOSIS — I5022 Chronic systolic (congestive) heart failure: Secondary | ICD-10-CM | POA: Diagnosis not present

## 2015-10-13 DIAGNOSIS — Z87891 Personal history of nicotine dependence: Secondary | ICD-10-CM | POA: Insufficient documentation

## 2015-10-13 DIAGNOSIS — G4730 Sleep apnea, unspecified: Secondary | ICD-10-CM | POA: Diagnosis not present

## 2015-10-13 DIAGNOSIS — R338 Other retention of urine: Secondary | ICD-10-CM | POA: Diagnosis not present

## 2015-10-13 DIAGNOSIS — I130 Hypertensive heart and chronic kidney disease with heart failure and stage 1 through stage 4 chronic kidney disease, or unspecified chronic kidney disease: Secondary | ICD-10-CM | POA: Diagnosis not present

## 2015-10-13 DIAGNOSIS — Z96643 Presence of artificial hip joint, bilateral: Secondary | ICD-10-CM | POA: Insufficient documentation

## 2015-10-13 DIAGNOSIS — Z95810 Presence of automatic (implantable) cardiac defibrillator: Secondary | ICD-10-CM | POA: Insufficient documentation

## 2015-10-13 DIAGNOSIS — I429 Cardiomyopathy, unspecified: Secondary | ICD-10-CM | POA: Diagnosis not present

## 2015-10-13 DIAGNOSIS — H9193 Unspecified hearing loss, bilateral: Secondary | ICD-10-CM | POA: Insufficient documentation

## 2015-10-13 DIAGNOSIS — Z7902 Long term (current) use of antithrombotics/antiplatelets: Secondary | ICD-10-CM | POA: Diagnosis not present

## 2015-10-13 DIAGNOSIS — I959 Hypotension, unspecified: Secondary | ICD-10-CM | POA: Diagnosis not present

## 2015-10-13 DIAGNOSIS — I252 Old myocardial infarction: Secondary | ICD-10-CM | POA: Insufficient documentation

## 2015-10-13 DIAGNOSIS — K219 Gastro-esophageal reflux disease without esophagitis: Secondary | ICD-10-CM | POA: Diagnosis not present

## 2015-10-13 DIAGNOSIS — Z955 Presence of coronary angioplasty implant and graft: Secondary | ICD-10-CM | POA: Diagnosis not present

## 2015-10-13 DIAGNOSIS — I480 Paroxysmal atrial fibrillation: Secondary | ICD-10-CM | POA: Insufficient documentation

## 2015-10-13 DIAGNOSIS — N401 Enlarged prostate with lower urinary tract symptoms: Secondary | ICD-10-CM | POA: Insufficient documentation

## 2015-10-13 DIAGNOSIS — I4891 Unspecified atrial fibrillation: Secondary | ICD-10-CM

## 2015-10-13 DIAGNOSIS — N183 Chronic kidney disease, stage 3 (moderate): Secondary | ICD-10-CM | POA: Insufficient documentation

## 2015-10-13 DIAGNOSIS — Z79899 Other long term (current) drug therapy: Secondary | ICD-10-CM | POA: Insufficient documentation

## 2015-10-13 DIAGNOSIS — I2510 Atherosclerotic heart disease of native coronary artery without angina pectoris: Secondary | ICD-10-CM | POA: Insufficient documentation

## 2015-10-13 DIAGNOSIS — F419 Anxiety disorder, unspecified: Secondary | ICD-10-CM | POA: Insufficient documentation

## 2015-10-13 DIAGNOSIS — I10 Essential (primary) hypertension: Secondary | ICD-10-CM | POA: Diagnosis not present

## 2015-10-13 DIAGNOSIS — Z8546 Personal history of malignant neoplasm of prostate: Secondary | ICD-10-CM | POA: Diagnosis not present

## 2015-10-13 HISTORY — PX: CARDIOVERSION: SHX1299

## 2015-10-13 LAB — POCT I-STAT, CHEM 8
BUN: 17 mg/dL (ref 6–20)
CALCIUM ION: 1.21 mmol/L (ref 1.12–1.23)
CREATININE: 1.3 mg/dL — AB (ref 0.61–1.24)
Chloride: 98 mmol/L — ABNORMAL LOW (ref 101–111)
GLUCOSE: 104 mg/dL — AB (ref 65–99)
HCT: 46 % (ref 39.0–52.0)
HEMOGLOBIN: 15.6 g/dL (ref 13.0–17.0)
POTASSIUM: 4.4 mmol/L (ref 3.5–5.1)
Sodium: 137 mmol/L (ref 135–145)
TCO2: 25 mmol/L (ref 0–100)

## 2015-10-13 SURGERY — CARDIOVERSION
Anesthesia: Monitor Anesthesia Care

## 2015-10-13 MED ORDER — LIDOCAINE 2% (20 MG/ML) 5 ML SYRINGE
INTRAMUSCULAR | Status: AC
  Filled 2015-10-13: qty 5

## 2015-10-13 MED ORDER — LIDOCAINE 2% (20 MG/ML) 5 ML SYRINGE
INTRAMUSCULAR | Status: DC | PRN
  Administered 2015-10-13: 20 mg via INTRAVENOUS

## 2015-10-13 MED ORDER — PROPOFOL 10 MG/ML IV BOLUS
INTRAVENOUS | Status: DC | PRN
  Administered 2015-10-13: 70 mg via INTRAVENOUS

## 2015-10-13 MED ORDER — PROPOFOL 10 MG/ML IV BOLUS
INTRAVENOUS | Status: AC
Start: 2015-10-13 — End: 2015-10-13
  Filled 2015-10-13: qty 20

## 2015-10-13 MED ORDER — SODIUM CHLORIDE 0.9 % IV SOLN
INTRAVENOUS | Status: DC
  Administered 2015-10-13: 11:00:00 via INTRAVENOUS

## 2015-10-13 NOTE — Discharge Instructions (Signed)
Electrical Cardioversion, Care After °Refer to this sheet in the next few weeks. These instructions provide you with information on caring for yourself after your procedure. Your health care provider may also give you more specific instructions. Your treatment has been planned according to current medical practices, but problems sometimes occur. Call your health care provider if you have any problems or questions after your procedure. °WHAT TO EXPECT AFTER THE PROCEDURE °After your procedure, it is typical to have the following sensations: °· Some redness on the skin where the shocks were delivered. If this is tender, a sunburn lotion or hydrocortisone cream may help. °· Possible return of an abnormal heart rhythm within hours or days after the procedure. °HOME CARE INSTRUCTIONS °· Take medicines only as directed by your health care provider. Be sure you understand how and when to take your medicine. °· Learn how to feel your pulse and check it often. °· Limit your activity for 48 hours after the procedure or as directed by your health care provider. °· Avoid or minimize caffeine and other stimulants as directed by your health care provider. °SEEK MEDICAL CARE IF: °· You feel like your heart is beating too fast or your pulse is not regular. °· You have any questions about your medicines. °· You have bleeding that will not stop. °SEEK IMMEDIATE MEDICAL CARE IF: °· You are dizzy or feel faint. °· It is hard to breathe or you feel short of breath. °· There is a change in discomfort in your chest. °· Your speech is slurred or you have trouble moving an arm or leg on one side of your body. °· You get a serious muscle cramp that does not go away. °· Your fingers or toes turn cold or blue. °  °This information is not intended to replace advice given to you by your health care provider. Make sure you discuss any questions you have with your health care provider. °  °Document Released: 12/03/2012 Document Revised: 03/05/2014  Document Reviewed: 12/03/2012 °Elsevier Interactive Patient Education ©2016 Elsevier Inc. ° °

## 2015-10-13 NOTE — Anesthesia Preprocedure Evaluation (Addendum)
Anesthesia Evaluation  Patient identified by MRN, date of birth, ID band Patient awake    Reviewed: Allergy & Precautions, NPO status , Patient's Chart, lab work & pertinent test results  Airway Mallampati: III  TM Distance: >3 FB Neck ROM: Full    Dental  (+) Teeth Intact, Chipped, Poor Dentition,    Pulmonary shortness of breath, sleep apnea , former smoker,    breath sounds clear to auscultation       Cardiovascular hypertension, + angina + CAD, + Past MI and +CHF  + dysrhythmias Atrial Fibrillation + pacemaker  Rhythm:Irregular Rate:Normal     Neuro/Psych    GI/Hepatic GERD  ,  Endo/Other    Renal/GU      Musculoskeletal  (+) Arthritis ,   Abdominal   Peds  Hematology   Anesthesia Other Findings   Reproductive/Obstetrics                           Anesthesia Physical Anesthesia Plan  ASA: III  Anesthesia Plan: General   Post-op Pain Management:    Induction: Intravenous  Airway Management Planned: Mask  Additional Equipment:   Intra-op Plan:   Post-operative Plan:   Informed Consent: I have reviewed the patients History and Physical, chart, labs and discussed the procedure including the risks, benefits and alternatives for the proposed anesthesia with the patient or authorized representative who has indicated his/her understanding and acceptance.     Plan Discussed with: Anesthesiologist and CRNA  Anesthesia Plan Comments:         Anesthesia Quick Evaluation

## 2015-10-13 NOTE — Anesthesia Postprocedure Evaluation (Signed)
Anesthesia Post Note  Patient: [name]  Procedure(s) Performed: Procedure(s) (LRB): CARDIOVERSION (N/A)  Patient location during evaluation: Endoscopy Anesthesia Type: General Level of consciousness: awake, awake and alert and oriented Pain management: pain level controlled Vital Signs Assessment: post-procedure vital signs reviewed and stable Respiratory status: nonlabored ventilation, respiratory function stable and spontaneous breathing Cardiovascular status: blood pressure returned to baseline Anesthetic complications: no    Last Vitals:  Vitals:   10/13/15 1120 10/13/15 1130  BP: 105/65 (!) 102/58  Pulse: 75 75  Resp: 19 18  Temp:      Last Pain:  Vitals:   10/13/15 1034  TempSrc: Oral  PainSc: 8                  Tanesha Arambula COKER

## 2015-10-13 NOTE — Transfer of Care (Signed)
Immediate Anesthesia Transfer of Care Note  Patient: [name]  Procedure(s) Performed: Procedure(s): CARDIOVERSION (N/A)  Patient Location: Endoscopy Unit  Anesthesia Type:MAC  Level of Consciousness: awake, alert  and oriented  Airway & Oxygen Therapy: Patient Spontanous Breathing  Post-op Assessment: Report given to RN  Post vital signs: Reviewed and stable  Last Vitals:  Vitals:   10/13/15 1103 10/13/15 1104  BP:    Pulse: 75 75  Resp: (!) 24 17  Temp:      Last Pain:  Vitals:   10/13/15 1034  TempSrc: Oral  PainSc: 8          Complications: No apparent anesthesia complications

## 2015-10-13 NOTE — Progress Notes (Signed)
Pt states that he is feeling sharp pains after cardioversion where the front pad was and near his pacemaker. Dr. Johnsie Cancel consulted. Per Dr Johnsie Cancel, pt advised that these pains can occur after cardioversions and to take tylenol and apply ice to chest after discharge. Pt and pt's caregiver verbalize understanding. Pt aware to call [facility] if pain persists and/or gets worse. BRT, RN

## 2015-10-13 NOTE — CV Procedure (Signed)
DCC: On Rx Eliquis with no missed doses Anesthesia: Dr Linna Caprice  [facility] x 3 120, 150 and 200J Converts to NSR but then back into afib [place] with [employer] monitored his device and tried To pace him at faster rate to maintain NSR with no luck  Will arrange f/u with [name] to see if he wants to increase Amiodarone or ? Try Waylan Rocher

## 2015-10-14 ENCOUNTER — Encounter (HOSPITAL_COMMUNITY): Payer: Self-pay | Admitting: Cardiovascular Disease

## 2015-10-20 ENCOUNTER — Encounter (INDEPENDENT_AMBULATORY_CARE_PROVIDER_SITE_OTHER): Payer: Commercial Managed Care - HMO | Admitting: Ophthalmology

## 2015-10-20 DIAGNOSIS — H353111 Nonexudative age-related macular degeneration, right eye, early dry stage: Secondary | ICD-10-CM

## 2015-10-20 DIAGNOSIS — H348322 Tributary (branch) retinal vein occlusion, left eye, stable: Secondary | ICD-10-CM | POA: Diagnosis not present

## 2015-10-20 DIAGNOSIS — H353122 Nonexudative age-related macular degeneration, left eye, intermediate dry stage: Secondary | ICD-10-CM | POA: Diagnosis not present

## 2015-10-20 DIAGNOSIS — I10 Essential (primary) hypertension: Secondary | ICD-10-CM | POA: Diagnosis not present

## 2015-10-20 DIAGNOSIS — H43813 Vitreous degeneration, bilateral: Secondary | ICD-10-CM

## 2015-10-20 DIAGNOSIS — H35033 Hypertensive retinopathy, bilateral: Secondary | ICD-10-CM | POA: Diagnosis not present

## 2015-10-26 NOTE — H&P (Signed)
From Clinic note 09/01/15: No changes  HPI [name] returns today for followup. I saw him in the office several months ago. He is s/p BiV PPM insertion and has developed worsening atrial fib with his symptoms mostly that of sob and fatigue. He cannot take warfarin because of a h/o GI bleeding. He is not on any AV nodal blocking drugs due to low blood pressure. His rates have been increased and he has had some problem with diaghragmatic stimulation. His symptoms remain class 3.        Allergies  Allergen Reactions  . Protonix [Pantoprazole Sodium] Other (See Comments)    Gi upset, insomnia  . Codeine Anxiety and Other (See Comments)    Insomnia/ hyper  . Prilosec [Omeprazole] Nausea And Vomiting and Other (See Comments)    GI upset, insomnia  . Warfarin Sodium Anxiety and Other (See Comments)    Hx gi bleed           Current Outpatient Prescriptions  Medication Sig Dispense Refill  . acetaminophen (TYLENOL) 500 MG tablet Take 500-1,000 mg by mouth every 6 (six) hours as needed (pain).     [name] amiodarone (PACERONE) 200 MG tablet Take 0.5 tablets (100 mg total) by mouth daily. 45 tablet 3  . amoxicillin (AMOXIL) 500 MG capsule Take 2,000 mg by mouth See admin instructions. Take 4 capsules (2000 mg) by mouth one hour prior to dental appointments    . bicalutamide (CASODEX) 50 MG tablet Take 50 mg by mouth daily.   3  . clopidogrel (PLAVIX) 75 MG tablet TAKE 1 TABLET(75 MG) BY MOUTH DAILY 90 tablet 3  . docusate sodium (COLACE) 100 MG capsule Take 100 mg by mouth daily.    . dorzolamide-timolol (COSOPT) 22.3-6.8 MG/ML ophthalmic solution Place 1 drop into both eyes 2 (two) times daily.   2  . doxazosin (CARDURA) 4 MG tablet Take 1 tablet (4 mg total) by mouth daily. 90 tablet 3  . finasteride (PROSCAR) 5 MG tablet Take 5 mg by mouth daily.    . fluticasone (FLONASE) 50 MCG/ACT nasal spray Place 2 sprays into both nostrils daily as needed for allergies.   1  .  furosemide (LASIX) 20 MG tablet Take 20 mg by mouth every other day.    [name] LORazepam (ATIVAN) 0.5 MG tablet Take 0.5 mg by mouth every 8 (eight) hours as needed for anxiety.     [name] losartan (COZAAR) 25 MG tablet Take 0.5 tablets (12.5 mg total) by mouth daily. 45 tablet 3  . Multiple Vitamin (MULTIVITAMIN WITH MINERALS) TABS tablet Take 1 tablet by mouth daily. Centrum Silver    . nitroGLYCERIN (NITROSTAT) 0.4 MG SL tablet Place 1 tablet (0.4 mg total) under the tongue every 5 (five) minutes as needed for chest pain. 20 tablet 0  . Omega 3-Lutein-Zeaxanthin (ADVANCED EYE HEALTH PO) Take 1 tablet by mouth daily with lunch.     . ondansetron (ZOFRAN) 4 MG tablet Take 1 tablet by mouth every 6 (six) hours as needed for nausea.   0  . OVER THE COUNTER MEDICATION Take 2 capsules by mouth 2 (two) times daily. Omega XL    . potassium chloride (K-DUR) 10 MEQ tablet Take 10 mEq by mouth every other day.    . Probiotic Product (PROBIOTIC PO) Take 2 tablets by mouth daily.    . psyllium (METAMUCIL) 58.6 % packet Take 1 packet by mouth daily as needed (for constipation).     . tamsulosin (FLOMAX) 0.4  MG CAPS capsule Take 0.4 mg by mouth daily after supper.    . traMADol (ULTRAM) 50 MG tablet Take 50 mg by mouth 2 (two) times daily.      No current facility-administered medications for this visit.          Past Medical History  Diagnosis Date  . PAF (paroxysmal atrial fibrillation) (HCC)     not on coumadin due to hx of GI bleed.  . Mitral regurgitation     a. Mild-mod by echo 12/2014.  [name] GERD (gastroesophageal reflux disease)     occ. take prevacid  . Meningioma ([place]) right -sided w/ right VI palsy    followed by dr Gaynell Face  . Diverticulitis of colon with bleeding     s/p sigmoid resection '88  . History of GI diverticular bleed april 2012    transfused blood and resolved without surgical intervention  . Impotence, organic     s/p penile prosthesis  1990's  . BPH (benign prostatic hypertrophy) with urinary obstruction     s/p turp yrs ago  . Blood transfusion     "related to a surgery"  . Impaired hearing bilateral     only left hearing aid  . Bradycardia     a. Amio d/c'd 08/2013; brady arrest 08/2013 after PCI >>> recurrent AF >>> Amiodarone restarted. b. Pacemaker being considered in 11/2014.  . Cardiomyopathy ([place])     a. Echo (08/2013):  EF 30-35%, AS hypokinesis, Gr 1 diast dysfn, mild MR, mild LAE >>> b. improved EF 50-55% by echo 8/15. c. EF down again by echo 12/2014 to 30-35% but 51% by nuc.  [name] CAD (coronary artery disease)     a. s/p MI and prior PCI of LAD;  b. LHC (08/2013):  prox LAD 60-70%, mid LAD stents ok, ostial lesion at Dx jailed by stent, mild CFX and RCA disesase >>>  PCI (09/08/13):  rotational atherectomy + Promus DES to prox LAD  . Prostate cancer ([place]) 11/30/13    Gleason 8, volume 22.14 cc  . Anxiety   . Depression   . Hypertension   . Chronic systolic CHF (congestive heart failure) ([place])   . Myocardial infarction [facility]) 1980's- medical intervention    "so mild I didn't know I'd had it"  . Sleep apnea     non-compliant cpap  . DJD (degenerative joint disease) hips and knees    s/p bilateral total replacements  . DDD (degenerative disc disease)     chronic back pain  . Arthritis   . Chronic lower back pain   . Incomplete bladder emptying   . LBBB (left bundle branch block)   . CKD (chronic kidney disease), stage III     a. Per review of labs baseline Cr 1.1-1.3.    ROS:   All systems reviewed and negative except as noted in the HPI.         Past Surgical History  Procedure Laterality Date  . Cholecystectomy    . Total hip arthroplasty Right 03-25-08--  . Total hip arthroplasty Left 2005  . Total knee arthroplasty Right 2004  . Total knee arthroplasty Left 1997  . Inguinal hernia repair Bilateral   . Cataract extraction w/ intraocular lens   implant, bilateral Bilateral ~ 2000  . Appendectomy    . Transurethral resection of prostate  "years ago"  . Penile prosthesis implant  1990's  . Shoulder open rotator cuff repair Left   . Inner ear surgery Right yrs ago    "  trying to get my hearing back  . Cardiac catheterization  2007    noncritical cad (results w/ chart)  . Coronary angioplasty with stent placement  08-03-08    drug-eluting stent x2 distal and mid lad  . Transurethral resection of prostate  01/08/2011    Procedure: TRANSURETHRAL RESECTION OF THE PROSTATE (TURP);  Surgeon: Franchot Gallo;  Location: [place];  Service: Urology;  Laterality: N/A;  GYRUS  . Prostate biopsy  11/30/13    Gleason 8, vol 22.14 cc  . Gamma knife radiation  2000    [facility] for meningioma, last eval 2013- no change  . Left heart catheterization with coronary angiogram N/A 09/04/2013    Procedure: LEFT HEART CATHETERIZATION WITH CORONARY ANGIOGRAM;  Surgeon: Jettie Booze, MD;  Location: [facility] CATH LAB;  Service: Cardiovascular;  Laterality: N/A;  . Percutaneous coronary rotoblator intervention (pci-r) N/A 09/08/2013    Procedure: PERCUTANEOUS CORONARY ROTOBLATOR INTERVENTION (PCI-R);  Surgeon: Jettie Booze, MD;  Location: [facility] CATH LAB;  Service: Cardiovascular;  Laterality: N/A;  . Revision total knee arthroplasty Right   . Total hip revision Right 3-4 times  . Closed reduction hip dislocation Right "several"  . Esophagogastroduodenoscopy N/A 02/11/2014    Procedure: ESOPHAGOGASTRODUODENOSCOPY (EGD);  Surgeon: Cleotis Nipper, MD;  Location: [facility] ENDOSCOPY;  Service: Endoscopy;  Laterality: N/A;  . Ep implantable device N/A 01/31/2015    Procedure: BiV Pacemaker Insertion CRT-P;  Surgeon: Evans Lance, MD;  Location: [place] CV LAB;  Service: Cardiovascular;  Laterality: N/A;  . Ep implantable device N/A 02/07/2015    Procedure: Lead Revision/Repair;  Surgeon: Deboraha Sprang, MD;   Location: [place] CV LAB;  Service: Cardiovascular;  Laterality: N/A;           Family History  Problem Relation Age of Onset  . Heart disease Mother   . Heart attack Mother   . Cancer    . Emphysema Father   . Cancer Brother     liver cancer     Social History        Social History  . Marital Status: Widowed    Spouse Name: N/A  . Number of Children: N/A  . Years of Education: N/A      Occupational History  . Not on file.         Social History Main Topics  . Smoking status: Former Smoker -- 1.00 packs/day for 14 years    Types: Cigarettes    Quit date: 01/05/1959  . Smokeless tobacco: Never Used  . Alcohol Use: 0.0 oz/week     Comment: 02/09/2014 "I'm not drinking anymore"  . Drug Use: No  . Sexual Activity: No       Other Topics Concern  . Not on file   Social History Narrative     BP 92/62 mmHg  Pulse 79  Ht 5\' 8"  (1.727 m)  Wt 186 lb 3.2 oz (84.46 kg)  BMI 28.32 kg/m2  Physical Exam:  Chronically ill appearing elderly man, NAD HEENT: Unremarkable Neck:  7 cm JVD, no thyromegally Back:  No CVA tenderness Lungs:  Clear with no wheezes, rales, or rhonchi. HEART:  IRegular rate rhythm, no murmurs, no rubs, no clicks Abd:  soft, positive bowel sounds, no organomegally, no rebound, no guarding Ext:  2 plus pulses, no edema, no cyanosis, no clubbing Skin:  No rashes no nodules Neuro:  CN II through XII intact, motor grossly intact  PM interogation - pacing less than 65%  of the time. He was pacing subthreshold.  Assess/Plan: 1. Chronic systolic heart failure - his symptoms are 3B. I have discussed ways we might improve his symptoms including AV node ablation as well as increasing amiodarone, starting Eliquis in low dose followed by DCCV and then stopping his Eliquis about 3 weeks after anti-coagulation. He understands that starting eliquis could result in bleeding. He will stop his plavix. 2. Atrial fib - We  will try to get him back to NSR by increasing his amio to 400 mg a day for 6 weeks before his DCCV. 3. PM - today we reprogrammed his device to assure capture and avoid diaphragmatic stimulation.  4. Coags/Bleeding - hopefully using low dose eliquis and stopping plavix will not result in GI bleeding.

## 2015-11-02 ENCOUNTER — Emergency Department (HOSPITAL_COMMUNITY)
Admission: EM | Admit: 2015-11-02 | Discharge: 2015-11-02 | Disposition: A | Discharge status: Home / Self Care | Payer: Commercial Managed Care - HMO | Attending: Emergency Medicine | Admitting: Emergency Medicine

## 2015-11-02 ENCOUNTER — Emergency Department (HOSPITAL_COMMUNITY): Payer: Commercial Managed Care - HMO

## 2015-11-02 ENCOUNTER — Encounter (HOSPITAL_COMMUNITY): Payer: Self-pay | Admitting: *Deleted

## 2015-11-02 DIAGNOSIS — R079 Chest pain, unspecified: Secondary | ICD-10-CM | POA: Insufficient documentation

## 2015-11-02 DIAGNOSIS — N183 Chronic kidney disease, stage 3 (moderate): Secondary | ICD-10-CM | POA: Diagnosis not present

## 2015-11-02 DIAGNOSIS — Z8546 Personal history of malignant neoplasm of prostate: Secondary | ICD-10-CM | POA: Insufficient documentation

## 2015-11-02 DIAGNOSIS — I509 Heart failure, unspecified: Secondary | ICD-10-CM | POA: Insufficient documentation

## 2015-11-02 DIAGNOSIS — Z9861 Coronary angioplasty status: Secondary | ICD-10-CM | POA: Insufficient documentation

## 2015-11-02 DIAGNOSIS — R197 Diarrhea, unspecified: Secondary | ICD-10-CM

## 2015-11-02 DIAGNOSIS — Z87891 Personal history of nicotine dependence: Secondary | ICD-10-CM | POA: Diagnosis not present

## 2015-11-02 DIAGNOSIS — Z79899 Other long term (current) drug therapy: Secondary | ICD-10-CM | POA: Insufficient documentation

## 2015-11-02 DIAGNOSIS — I130 Hypertensive heart and chronic kidney disease with heart failure and stage 1 through stage 4 chronic kidney disease, or unspecified chronic kidney disease: Secondary | ICD-10-CM | POA: Insufficient documentation

## 2015-11-02 DIAGNOSIS — R1031 Right lower quadrant pain: Secondary | ICD-10-CM | POA: Diagnosis not present

## 2015-11-02 DIAGNOSIS — K5900 Constipation, unspecified: Secondary | ICD-10-CM | POA: Insufficient documentation

## 2015-11-02 DIAGNOSIS — E871 Hypo-osmolality and hyponatremia: Secondary | ICD-10-CM | POA: Diagnosis not present

## 2015-11-02 DIAGNOSIS — R110 Nausea: Secondary | ICD-10-CM

## 2015-11-02 DIAGNOSIS — I2510 Atherosclerotic heart disease of native coronary artery without angina pectoris: Secondary | ICD-10-CM | POA: Insufficient documentation

## 2015-11-02 DIAGNOSIS — K5790 Diverticulosis of intestine, part unspecified, without perforation or abscess without bleeding: Secondary | ICD-10-CM | POA: Diagnosis not present

## 2015-11-02 LAB — BASIC METABOLIC PANEL
ANION GAP: 9 (ref 5–15)
BUN: 10 mg/dL (ref 6–20)
CHLORIDE: 90 mmol/L — AB (ref 101–111)
CO2: 25 mmol/L (ref 22–32)
Calcium: 9 mg/dL (ref 8.9–10.3)
Creatinine, Ser: 1.13 mg/dL (ref 0.61–1.24)
GFR calc Af Amer: >60 mL/min (ref >60)
GFR, EST NON AFRICAN AMERICAN: 56 mL/min — AB (ref >60)
Glucose, Bld: 115 mg/dL — ABNORMAL HIGH (ref 65–99)
POTASSIUM: 4.1 mmol/L (ref 3.5–5.1)
SODIUM: 124 mmol/L — AB (ref 135–145)

## 2015-11-02 LAB — CBC
HEMATOCRIT: 39.1 % (ref 39.0–52.0)
HEMOGLOBIN: 13.9 g/dL (ref 13.0–17.0)
MCH: 32.3 pg (ref 26.0–34.0)
MCHC: 35.5 g/dL (ref 30.0–36.0)
MCV: 90.7 fL (ref 78.0–100.0)
Platelets: 215 10*3/uL (ref 150–400)
RBC: 4.31 MIL/uL (ref 4.22–5.81)
RDW: 13.3 % (ref 11.5–15.5)
WBC: 5.2 10*3/uL (ref 4.0–10.5)

## 2015-11-02 LAB — I-STAT TROPONIN, ED: Troponin i, poc: 0.01 ng/mL (ref 0.00–0.08)

## 2015-11-02 MED ORDER — ONDANSETRON 4 MG PO TBDP
4.0000 mg | ORAL_TABLET | Freq: Once | ORAL | Status: DC | PRN
  Filled 2015-11-02: qty 1

## 2015-11-02 MED ORDER — SODIUM CHLORIDE 0.9 % IV BOLUS (SEPSIS)
500.0000 mL | Freq: Once | INTRAVENOUS | Status: AC
  Administered 2015-11-02: 500 mL via INTRAVENOUS

## 2015-11-02 MED ORDER — IOPAMIDOL (ISOVUE-300) INJECTION 61%
100.0000 mL | Freq: Once | INTRAVENOUS | Status: AC | PRN
  Administered 2015-11-02: 100 mL via INTRAVENOUS

## 2015-11-02 MED ORDER — ONDANSETRON 4 MG PO TBDP: 4.0000 mg | ORAL_TABLET | Freq: Three times a day (TID) | ORAL | 0 refills | Status: DC | PRN

## 2015-11-02 NOTE — ED Triage Notes (Signed)
Pt complains of right chest, lower abdominal pain, nausea for the past several weeks. Pt states he feels constipated. Pt has pacemaker.

## 2015-11-02 NOTE — ED Notes (Signed)
Pt was in triage for lab work -  [facility] into room and patient stated that he was about to pass out.  Cool rag placed on forehead and RN was notified.  Pt thinks lab draw will make him actually pass out.  RN to notify us when pt isnt as lightheaded and thinks he can tolerate blood draw.

## 2015-11-02 NOTE — ED Provider Notes (Signed)
[facility] DEPT Provider Note   CSN: [id number] Arrival date & time: 11/02/15  1314     History   Chief Complaint Chief Complaint  Patient presents with  . Abdominal Pain  . Chest Pain    HPI [name] is a 82 y.o. male.  The history is provided by the patient.  Abdominal Pain   This is a new problem. Associated symptoms include diarrhea, nausea and constipation. Pertinent negatives include fever and vomiting.  Chest Pain   Associated symptoms include abdominal pain and nausea. Pertinent negatives include no back pain, no fever, no shortness of breath and no vomiting.  Pertinent negatives for past medical history include no seizures.  patient Presents with around 3 weeks of abdominal pain. States that he has had nausea. Also diarrhea and constipation. States abdomen had been more swollen but now is less swollen. Right lower quadrant tenderness. No dysuria. Also occasionally has sharp right-sided chest pain. No difficulty breathing. States he feels bad all over. States he has felt worse after change in his medications and cardioversion. Legs have been swollen but states also also improved.  Past Medical History:  Diagnosis Date  . Anxiety   . Arthritis   . Blood transfusion    "related to a surgery"  . BPH (benign prostatic hypertrophy) with urinary obstruction    s/p turp yrs ago  . Bradycardia    a. Amio d/c'd 08/2013; brady arrest 08/2013 after PCI >>> recurrent AF >>> Amiodarone restarted. b. Pacemaker being considered in 11/2014.  [name] CAD (coronary artery disease)    a. s/p MI and prior PCI of LAD;  b. LHC (08/2013):  prox LAD 60-70%, mid LAD stents ok, ostial lesion at Dx jailed by stent, mild CFX and RCA disesase >>>  PCI (09/08/13):  rotational atherectomy + Promus DES to prox LAD  . Cardiomyopathy ([place])    a. Echo (08/2013):  EF 30-35%, AS hypokinesis, Gr 1 diast dysfn, mild MR, mild LAE >>> b. improved EF 50-55% by echo 8/15. c. EF down again by echo 12/2014 to  30-35% but 51% by nuc.  [name] Chronic lower back pain   . Chronic systolic CHF (congestive heart failure) ([place])   . CKD (chronic kidney disease), stage III    a. Per review of labs baseline Cr 1.1-1.3.  [name] DDD (degenerative disc disease)    chronic back pain  . Depression   . Diverticulitis of colon with bleeding    s/p sigmoid resection '88  . DJD (degenerative joint disease) hips and knees   s/p bilateral total replacements  . GERD (gastroesophageal reflux disease)    occ. take prevacid  . History of GI diverticular bleed april 2012   transfused blood and resolved without surgical intervention  . Hypertension   . Impaired hearing bilateral    only left hearing aid  . Impotence, organic    s/p penile prosthesis 1990's  . Incomplete bladder emptying   . LBBB (left bundle branch block)   . Meningioma ([place]) right -sided w/ right VI palsy   followed by dr Gaynell Face  . Mitral regurgitation    a. Mild-mod by echo 12/2014.  [name] Myocardial infarction [facility]) 1980's- medical intervention   "so mild I didn't know I'd had it"  . PAF (paroxysmal atrial fibrillation) (HCC)    not on coumadin due to hx of GI bleed.  . Prostate cancer ([place]) 11/30/13   Gleason 8, volume 22.14 cc  . Sleep apnea    non-compliant cpap  Patient Active Problem List   Diagnosis Date Noted  . Orthostatic hypotension 02/06/2015  . Weakness 01/05/2015  . History of GI bleed 01/05/2015  . Hypokalemia 01/05/2015  . Chronic systolic CHF (congestive heart failure) ([place]) 01/03/2015  . Dyspnea 11/30/2014  . Fatigue 02/22/2014  . Coronary artery disease involving native coronary artery of native heart without angina pectoris   . Prostate cancer ([place])   . GI bleed 02/09/2014  . Chest pain 02/09/2014  . Malignant neoplasm of prostate ([place]) 12/15/2013  . Other malaise and fatigue 11/16/2013  . Chronic systolic heart failure ([place]) 10/12/2013  . Left bundle branch block 10/12/2013  . PAF (paroxysmal atrial fibrillation)  ([place]) 09/05/2013  . Angina, class III ([place]) 09/05/2013  . R wrist hematoma following coronary angiography   . Bradycardia   . Coronary atherosclerosis of native coronary artery 09/03/2013  . Mitral valve disorder 07/10/2013  . Essential hypertension, benign 07/10/2013  . Cough 07/10/2013  . Nodular prostate without urinary obstruction 01/08/2011  . Hypertrophy of prostate without urinary obstruction and other lower urinary tract symptoms (LUTS) 01/08/2011  . Impotence of organic origin 01/08/2011    Past Surgical History:  Procedure Laterality Date  . APPENDECTOMY    . CARDIAC CATHETERIZATION  2007   noncritical cad (results w/ chart)  . CARDIOVERSION N/A 10/13/2015   Procedure: CARDIOVERSION;  Surgeon: Josue Hector, MD;  Location: [facility];  Service: Cardiovascular;  Laterality: N/A;  . CATARACT EXTRACTION W/ INTRAOCULAR LENS  IMPLANT, BILATERAL Bilateral ~ 2000  . CHOLECYSTECTOMY    . CLOSED REDUCTION HIP DISLOCATION Right "several"  . CORONARY ANGIOPLASTY WITH STENT PLACEMENT  08-03-08   drug-eluting stent x2 distal and mid lad  . EP IMPLANTABLE DEVICE N/A 01/31/2015   Procedure: BiV Pacemaker Insertion CRT-P;  Surgeon: Evans Lance, MD;  Location: [facility] CV LAB;  Service: Cardiovascular;  Laterality: N/A;  . EP IMPLANTABLE DEVICE N/A 02/07/2015   Procedure: Lead Revision/Repair;  Surgeon: Deboraha Sprang, MD;  Location: [facility] CV LAB;  Service: Cardiovascular;  Laterality: N/A;  . ESOPHAGOGASTRODUODENOSCOPY N/A 02/11/2014   Procedure: ESOPHAGOGASTRODUODENOSCOPY (EGD);  Surgeon: Cleotis Nipper, MD;  Location: [facility] ENDOSCOPY;  Service: Endoscopy;  Laterality: N/A;  . gamma knife radiation  2000   [facility] for meningioma, last eval 2013- no change  . INGUINAL HERNIA REPAIR Bilateral   . INNER EAR SURGERY Right yrs ago   "trying to get my hearing back  . LEFT HEART CATHETERIZATION WITH CORONARY ANGIOGRAM N/A 09/04/2013   Procedure: LEFT HEART CATHETERIZATION WITH  CORONARY ANGIOGRAM;  Surgeon: Jettie Booze, MD;  Location: [facility] CATH LAB;  Service: Cardiovascular;  Laterality: N/A;  . PENILE PROSTHESIS IMPLANT  1990's  . PERCUTANEOUS CORONARY ROTOBLATOR INTERVENTION (PCI-R) N/A 09/08/2013   Procedure: PERCUTANEOUS CORONARY ROTOBLATOR INTERVENTION (PCI-R);  Surgeon: Jettie Booze, MD;  Location: [facility] CATH LAB;  Service: Cardiovascular;  Laterality: N/A;  . PROSTATE BIOPSY  11/30/13   Gleason 8, vol 22.14 cc  . REVISION TOTAL KNEE ARTHROPLASTY Right   . SHOULDER OPEN ROTATOR CUFF REPAIR Left   . TOTAL HIP ARTHROPLASTY Right 03-25-08--  . TOTAL HIP ARTHROPLASTY Left 2005  . TOTAL HIP REVISION Right 3-4 times  . TOTAL KNEE ARTHROPLASTY Right 2004  . TOTAL KNEE ARTHROPLASTY Left 1997  . TRANSURETHRAL RESECTION OF PROSTATE  "years ago"  . TRANSURETHRAL RESECTION OF PROSTATE  01/08/2011   Procedure: TRANSURETHRAL RESECTION OF THE PROSTATE (TURP);  Surgeon: Franchot Gallo;  Location: [facility];  Service: Urology;  Laterality: N/A;  GYRUS        Home Medications    Prior to Admission medications   Medication Sig Start Date End Date Taking? Authorizing Provider  acetaminophen (TYLENOL) 500 MG tablet Take 500-1,000 mg by mouth every 6 (six) hours as needed (pain).    Yes Historical Provider, MD  amiodarone (PACERONE) 200 MG tablet Take 1.5 tablets (300 mg total) by mouth daily. Patient taking differently: Take 200 mg by mouth daily.  09/15/15  Yes Amber Sena Slate, NP  amoxicillin (AMOXIL) 500 MG capsule Take 2,000 mg by mouth See admin instructions. Take 4 capsules (2000 mg) by mouth one hour prior to dental appointments   Yes Historical Provider, MD  bicalutamide (CASODEX) 50 MG tablet Take 50 mg by mouth daily.  12/29/13  Yes Historical Provider, MD  brimonidine (ALPHAGAN) 0.15 % ophthalmic solution Place 1 drop into both eyes 2 (two) times daily. 08/11/15  Yes Historical Provider, MD  clopidogrel (PLAVIX) 75 MG tablet Take 75 mg by  mouth daily.   Yes Historical Provider, MD  docusate sodium (COLACE) 100 MG capsule Take 100 mg by mouth daily.   Yes Historical Provider, MD  dorzolamide-timolol (COSOPT) 22.3-6.8 MG/ML ophthalmic solution Place 1 drop into both eyes 2 (two) times daily.  11/12/13  Yes Historical Provider, MD  finasteride (PROSCAR) 5 MG tablet Take 5 mg by mouth daily. 12/08/13  Yes Historical Provider, MD  fluticasone (FLONASE) 50 MCG/ACT nasal spray Place 2 sprays into both nostrils daily as needed for allergies.  12/08/13  Yes Historical Provider, MD  furosemide (LASIX) 20 MG tablet Take 20 mg by mouth every other day.   Yes Historical Provider, MD  LORazepam (ATIVAN) 0.5 MG tablet Take 0.5 mg by mouth every 8 (eight) hours as needed for anxiety.    Yes Historical Provider, MD  losartan (COZAAR) 25 MG tablet Take 0.5 tablets (12.5 mg total) by mouth daily. 07/15/15  Yes Jettie Booze, MD  Multiple Vitamin (MULTIVITAMIN WITH MINERALS) TABS tablet Take 1 tablet by mouth daily. Centrum Silver   Yes Historical Provider, MD  nitroGLYCERIN (NITROSTAT) 0.4 MG SL tablet Place 1 tablet (0.4 mg total) under the tongue every 5 (five) minutes as needed for chest pain. 09/21/13  Yes Scott T Kathlen Mody, PA-C  Omega 3-Lutein-Zeaxanthin (ADVANCED EYE HEALTH PO) Take 1 tablet by mouth daily with lunch.    Yes Historical Provider, MD  ondansetron (ZOFRAN) 4 MG tablet Take 1 tablet by mouth every 6 (six) hours as needed for nausea.  01/12/15  Yes Historical Provider, MD  potassium chloride (K-DUR) 10 MEQ tablet Take 10 mEq by mouth every other day.   Yes Historical Provider, MD  Probiotic Product (PROBIOTIC PO) Take 1 tablet by mouth 2 (two) times daily.    Yes Historical Provider, MD  psyllium (METAMUCIL) 58.6 % packet Take 1 packet by mouth daily as needed (for constipation).    Yes Historical Provider, MD  tamsulosin (FLOMAX) 0.4 MG CAPS capsule Take 0.4 mg by mouth daily after supper.   Yes Historical Provider, MD  traMADol  (ULTRAM) 50 MG tablet Take 50 mg by mouth daily.  12/23/14  Yes Historical Provider, MD  apixaban (ELIQUIS) 2.5 MG TABS tablet Take 1 tablet (2.5 mg total) by mouth 2 (two) times daily. Patient not taking: Reported on 11/02/2015 09/19/15   Evans Lance, MD  ondansetron (ZOFRAN-ODT) 4 MG disintegrating tablet Take 1 tablet (4 mg total) by mouth every 8 (eight) hours as needed  for nausea or vomiting. 11/02/15   Davonna Belling, MD    Family History Family History  Problem Relation Age of Onset  . Heart disease Mother   . Heart attack Mother   . Emphysema Father   . Cancer Brother     liver cancer  . Cancer      Social History Social History  Substance Use Topics  . Smoking status: Former Smoker    Packs/day: 1.00    Years: 14.00    Types: Cigarettes    Quit date: 01/05/1959  . Smokeless tobacco: Never Used  . Alcohol use 0.0 oz/week     Comment: 02/09/2014 "I'm not drinking anymore"     Allergies   Protonix [pantoprazole sodium]; Codeine; Prilosec [omeprazole]; and Warfarin sodium   Review of Systems Review of Systems  Constitutional: Positive for appetite change and fatigue. Negative for fever.  HENT: Negative for congestion.   Respiratory: Negative for shortness of breath.   Cardiovascular: Positive for chest pain.  Gastrointestinal: Positive for abdominal pain, constipation, diarrhea and nausea. Negative for vomiting.  Endocrine: Negative for polydipsia.  Genitourinary: Negative for flank pain.  Musculoskeletal: Negative for back pain.  Skin: Negative for rash.  Neurological: Negative for seizures.  Hematological: Negative for adenopathy.  Psychiatric/Behavioral: Negative for agitation.     Physical Exam Updated Vital Signs BP 138/97 (BP Location: Left Arm)   Pulse 82   Temp 97.9 F (36.6 C) (Oral)   Resp 20   SpO2 93%   Physical Exam  Constitutional: He appears well-developed.  HENT:  Head: Atraumatic.  Eyes: EOM are normal.  Cardiovascular: Normal  rate.   Pacemaker left chest wall  Pulmonary/Chest: Effort normal.  Abdominal:  Mild distention. Minimal tenderness in right lower quadrant. No hernias palpated. No masses.  Musculoskeletal: Normal range of motion. He exhibits no edema.  Neurological: He is alert.  Skin: Skin is warm. Capillary refill takes less than 2 seconds.  Psychiatric: He has a normal mood and affect.     ED Treatments / Results  Labs (all labs ordered are listed, but only abnormal results are displayed) Labs Reviewed  BASIC METABOLIC PANEL - Abnormal; Notable for the following:       Result Value   Sodium 124 (*)    Chloride 90 (*)    Glucose, Bld 115 (*)    GFR calc non Af Amer 56 (*)    All other components within normal limits  CBC  I-STAT TROPOININ, ED    EKG  EKG Interpretation  Date/Time:  Wednesday November 02 2015 13:48:56 EDT Ventricular Rate:  94 PR Interval:    QRS Duration: 170 QT Interval:  449 QTC Calculation: 562 R Axis:   -52 Text Interpretation:  Sinus rhythm Atrial premature complex Left bundle branch block No significant change since last tracing Confirmed by KNAPP  MD-J, JON [id number]) on 11/02/2015 3:40:47 PM       Radiology Dg Chest 2 View  Result Date: 11/02/2015 CLINICAL DATA:  Nausea and dizziness with chest pain for several days, initial encounter EXAM: CHEST  2 VIEW COMPARISON:  05/19/2015 FINDINGS: Cardiac shadow is stable. A pacing device is again seen and stable. Aortic calcifications are again noted and stable. Skin fold is noted over the lateral right chest. The lungs are well-aerated without focal infiltrate. Minimal blunting of costophrenic angles is noted posteriorly and chronic in nature. No acute bony abnormality is seen. IMPRESSION: No acute abnormality seen. Electronically Signed   By: Linus Mako.D.  On: 11/02/2015 14:31   Ct Abdomen Pelvis W Contrast  Result Date: 11/02/2015 CLINICAL DATA:  82 year old male lower abdominal pain and nausea. History of  prostate cancer. EXAM: CT ABDOMEN AND PELVIS WITH CONTRAST TECHNIQUE: Multidetector CT imaging of the abdomen and pelvis was performed using the standard protocol following bolus administration of intravenous contrast. CONTRAST:  137mL ISOVUE-300 IOPAMIDOL (ISOVUE-300) INJECTION 61% COMPARISON:  Abdominal CT dated 12/25/2013. FINDINGS: There bibasilar linear atelectasis/scarring. There is a 7 mm left upper lobe subpleural nodule (series 2, image 7). There is extensive coronary vascular calcification, CABG vascular clips. Cardiac pacemaker wires noted. There is a small right pleural effusion. No intra-abdominal free air or free fluid. Cholecystectomy. The liver, pancreas, spleen, adrenal glands appear unremarkable. There is mild bilateral renal atrophy and cortical thinning. There is no hydronephrosis on either side. The visualized ureters and urinary bladder appear unremarkable. Evaluation of the prostate gland is very limited due to streak artifact caused by metallic hip arthroplasties. A penile implant with reservoir in the left anterior hemipelvis. There is sigmoid diverticulosis with small scattered colonic diverticula. There is segmental thickened appearance of the distal descending/sigmoid colon which may be related to underdistention. Mild colitis is not excluded. Clinical correlation is recommended. Loose stool noted in the distal colon and rectosigmoid compatible with diarrheal state. There is no evidence of bowel obstruction. The appendix is not visualized with certainty. No inflammatory changes identified in the right lower quadrant. There is advanced aortoiliac atherosclerotic disease. The aorta is tortuous. There is no aneurysmal dilatation or evidence of dissection. There is a mild narrowing of the aortic lumen at the level of L2 secondary to atherosclerotic calcification. The origins of the celiac axis, SMA, IMA as well as the origins of the renal arteries remain patent. The splenic vein and the main  portal vein are patent. No portal venous gas identified. There is no adenopathy. The abdominal wall soft tissues appear unremarkable. There is osteopenia with scoliosis and extensive degenerative changes of the spine. Multilevel disc desiccation with vacuum phenomena. No acute fracture. IMPRESSION: Diarrheal state with segmental thickening of the distal descending/ sigmoid colon may be related to underdistention or represent mild colitis. Correlation with clinical exam and stool cultures recommended. No bowel obstruction. Colonic diverticulosis. **An incidental finding of potential clinical significance has been found. A 7 mm left upper lobe/lingula subpleural nodule. Nonemergent CT of the chest may provide complete evaluation of the lungs. Alternatively a Non-contrast chest CT at 6-12 months is recommended. If the nodule is stable at time of repeat CT, then future CT at 18-24 months (from today's scan) is considered optional for low-risk patients, but is recommended for high-risk patients. This recommendation follows the consensus statement: Guidelines for Management of Incidental Pulmonary Nodules Detected on CT Images:From the Fleischner Society 2017; published online before print (10.1148/radiol.IJ:2314499).** Electronically Signed   By: Anner Crete M.D.   On: 11/02/2015 20:03    Procedures Procedures (including critical care time)  Medications Ordered in ED Medications  ondansetron (ZOFRAN-ODT) disintegrating tablet 4 mg (not administered)  sodium chloride 0.9 % bolus 500 mL (500 mLs Intravenous New Bag/Given 11/02/15 1832)  iopamidol (ISOVUE-300) 61 % injection 100 mL (100 mLs Intravenous Contrast Given 11/02/15 1933)     Initial Impression / Assessment and Plan / ED Course  I have reviewed the triage vital signs and the nursing notes.  Pertinent labs & imaging results that were available during my care of the patient were reviewed by me and considered in my medical decision  making (see chart  for details).  Clinical Course    Patient with nausea and diarrhea and right-sided abdominal pain. He has had for a few weeks now. Also right-sided chest pain. Lab work overall reassuring but mild hyponatremia. Fluid bolus given. Patient's nausea improved somewhat. CT scan overall reassuring. Left-sided colonic findings are not mucositis tenderness. Informed both lung nodule that would need to be followed. Patient asked for a copy of his CT. Will discharge home follow-up his primary care doctor. Doubt cardiac cause of the chest pain. Abdominal pain is also somewhat indeterminate at this time.  Final Clinical Impressions(s) / ED Diagnoses   Final diagnoses:  Nausea  Diarrhea, unspecified type  Hyponatremia    New Prescriptions New Prescriptions   ONDANSETRON (ZOFRAN-ODT) 4 MG DISINTEGRATING TABLET    Take 1 tablet (4 mg total) by mouth every 8 (eight) hours as needed for nausea or vomiting.     Davonna Belling, MD 11/02/15 2041

## 2015-11-10 DIAGNOSIS — H90A22 Sensorineural hearing loss, unilateral, left ear, with restricted hearing on the contralateral side: Secondary | ICD-10-CM | POA: Diagnosis not present

## 2015-11-10 DIAGNOSIS — H90A31 Mixed conductive and sensorineural hearing loss, unilateral, right ear with restricted hearing on the contralateral side: Secondary | ICD-10-CM | POA: Diagnosis not present

## 2015-11-10 DIAGNOSIS — H9312 Tinnitus, left ear: Secondary | ICD-10-CM | POA: Diagnosis not present

## 2015-11-11 ENCOUNTER — Encounter: Payer: Commercial Managed Care - HMO | Admitting: Internal Medicine

## 2015-11-15 ENCOUNTER — Encounter: Payer: Self-pay | Admitting: Internal Medicine

## 2015-11-15 ENCOUNTER — Ambulatory Visit (INDEPENDENT_AMBULATORY_CARE_PROVIDER_SITE_OTHER): Payer: Commercial Managed Care - HMO | Admitting: Internal Medicine

## 2015-11-15 VITALS — BP 130/80 | HR 76 | Ht 68.0 in | Wt 181.0 lb

## 2015-11-15 DIAGNOSIS — F419 Anxiety disorder, unspecified: Secondary | ICD-10-CM | POA: Diagnosis not present

## 2015-11-15 DIAGNOSIS — I5022 Chronic systolic (congestive) heart failure: Secondary | ICD-10-CM

## 2015-11-15 DIAGNOSIS — M4807 Spinal stenosis, lumbosacral region: Secondary | ICD-10-CM | POA: Diagnosis not present

## 2015-11-15 DIAGNOSIS — Z23 Encounter for immunization: Secondary | ICD-10-CM | POA: Diagnosis not present

## 2015-11-15 DIAGNOSIS — M961 Postlaminectomy syndrome, not elsewhere classified: Secondary | ICD-10-CM | POA: Diagnosis not present

## 2015-11-15 DIAGNOSIS — M5415 Radiculopathy, thoracolumbar region: Secondary | ICD-10-CM | POA: Diagnosis not present

## 2015-11-15 DIAGNOSIS — K5900 Constipation, unspecified: Secondary | ICD-10-CM | POA: Diagnosis not present

## 2015-11-15 DIAGNOSIS — M419 Scoliosis, unspecified: Secondary | ICD-10-CM | POA: Diagnosis not present

## 2015-11-15 NOTE — Patient Instructions (Addendum)
Medication Instructions:  Your physician has recommended you make the following change in your medication:  1) Stop Amiodarone 2) Stop Eliquis   Continue on Losartan, Furosemide and Plavix  You may hold the Plavix 5 days prior to back procedure   Labwork: None Ordered   Testing/Procedures: None Ordered   Follow-Up: Your physician wants you to follow-up in: 6 months Dr Knox Saliva will receive a reminder letter in the mail two months in advance. If you don't receive a letter, please call our office to schedule the follow-up appointment.  Remote monitoring is used to monitor your Pacemaker from home. This monitoring reduces the number of office visits required to check your device to one time per year. It allows [place] to keep an eye on the functioning of your device to ensure it is working properly. You are scheduled for a device check from home on 02/14/16. You may send your transmission at any time that day. If you have a wireless device, the transmission will be sent automatically. After your physician reviews your transmission, you will receive a postcard with your next transmission date.   Any Other Special Instructions Will Be Listed Below (If Applicable).     If you need a refill on your cardiac medications before your next appointment, please call your pharmacy.

## 2015-11-15 NOTE — Progress Notes (Signed)
HPI Mr. An returns today for followup. I saw him in the office several months ago. He is s/p BiV PPM insertion and has developed worsening atrial fib with his symptoms mostly that of sob and fatigue. He has had a h/o GI bleeding on Warfarin and we tried to place him on amiodarone and brief eliquis but DCCV failed to maintain him in NSR. He now wants to know what cardiac meds he should be taking. The patient has class 2 dyspnea but is also very sedentary.   Allergies  Allergen Reactions  . Protonix [Pantoprazole Sodium] Other (See Comments)    Gi upset, insomnia  . Codeine Anxiety and Other (See Comments)    Insomnia/ hyper  . Prilosec [Omeprazole] Nausea And Vomiting and Other (See Comments)    GI upset, insomnia   . Warfarin Sodium Anxiety and Other (See Comments)    Hx gi bleed      Current Outpatient Prescriptions  Medication Sig Dispense Refill  . acetaminophen (TYLENOL) 500 MG tablet Take 500-1,000 mg by mouth every 6 (six) hours as needed (pain).     [name] amoxicillin (AMOXIL) 500 MG capsule Take 2,000 mg by mouth See admin instructions. Take 4 capsules (2000 mg) by mouth one hour prior to dental appointments    . bicalutamide (CASODEX) 50 MG tablet Take 50 mg by mouth daily.   3  . brimonidine (ALPHAGAN) 0.15 % ophthalmic solution Place 1 drop into both eyes 2 (two) times daily.    . clopidogrel (PLAVIX) 75 MG tablet Take 75 mg by mouth daily.    [name] docusate sodium (COLACE) 100 MG capsule Take 100 mg by mouth daily.    . dorzolamide-timolol (COSOPT) 22.3-6.8 MG/ML ophthalmic solution Place 1 drop into both eyes 2 (two) times daily.   2  . finasteride (PROSCAR) 5 MG tablet Take 5 mg by mouth daily.    . fluticasone (FLONASE) 50 MCG/ACT nasal spray Place 2 sprays into both nostrils daily as needed for allergies.   1  . furosemide (LASIX) 20 MG tablet Take 20 mg by mouth every other day.    [name] LORazepam (ATIVAN) 0.5 MG tablet Take 0.5 mg by mouth every 8 (eight) hours as  needed for anxiety.     [name] losartan (COZAAR) 25 MG tablet Take 0.5 tablets (12.5 mg total) by mouth daily. (Patient not taking: Reported on 11/15/2015) 45 tablet 3  . Multiple Vitamin (MULTIVITAMIN WITH MINERALS) TABS tablet Take 1 tablet by mouth daily. Centrum Silver    . nitroGLYCERIN (NITROSTAT) 0.4 MG SL tablet Place 1 tablet (0.4 mg total) under the tongue every 5 (five) minutes as needed for chest pain. (Patient not taking: Reported on 11/15/2015) 20 tablet 0  . Omega 3-Lutein-Zeaxanthin (ADVANCED EYE HEALTH PO) Take 1 tablet by mouth daily with lunch.     . ondansetron (ZOFRAN) 4 MG tablet Take 1 tablet by mouth every 6 (six) hours as needed for nausea.   0  . ondansetron (ZOFRAN-ODT) 4 MG disintegrating tablet Take 1 tablet (4 mg total) by mouth every 8 (eight) hours as needed for nausea or vomiting. (Patient not taking: Reported on 11/15/2015) 10 tablet 0  . potassium chloride (K-DUR) 10 MEQ tablet Take 10 mEq by mouth every other day.    . Probiotic Product (PROBIOTIC PO) Take 1 tablet by mouth 2 (two) times daily.     . psyllium (METAMUCIL) 58.6 % packet Take 1 packet by mouth daily as needed (for constipation).     [name]  tamsulosin (FLOMAX) 0.4 MG CAPS capsule Take 0.4 mg by mouth daily after supper.    . traMADol (ULTRAM) 50 MG tablet Take 50 mg by mouth daily.      No current facility-administered medications for this visit.      Past Medical History:  Diagnosis Date  . Anxiety   . Arthritis   . Blood transfusion    "related to a surgery"  . BPH (benign prostatic hypertrophy) with urinary obstruction    s/p turp yrs ago  . Bradycardia    a. Amio d/c'd 08/2013; brady arrest 08/2013 after PCI >>> recurrent AF >>> Amiodarone restarted. b. Pacemaker being considered in 11/2014.  [name] CAD (coronary artery disease)    a. s/p MI and prior PCI of LAD;  b. LHC (08/2013):  prox LAD 60-70%, mid LAD stents ok, ostial lesion at Dx jailed by stent, mild CFX and RCA disesase >>>  PCI (09/08/13):   rotational atherectomy + Promus DES to prox LAD  . Cardiomyopathy ([place])    a. Echo (08/2013):  EF 30-35%, AS hypokinesis, Gr 1 diast dysfn, mild MR, mild LAE >>> b. improved EF 50-55% by echo 8/15. c. EF down again by echo 12/2014 to 30-35% but 51% by nuc.  [name] Chronic lower back pain   . Chronic systolic CHF (congestive heart failure) ([place])   . CKD (chronic kidney disease), stage III    a. Per review of labs baseline Cr 1.1-1.3.  [name] DDD (degenerative disc disease)    chronic back pain  . Depression   . Diverticulitis of colon with bleeding    s/p sigmoid resection '88  . DJD (degenerative joint disease) hips and knees   s/p bilateral total replacements  . GERD (gastroesophageal reflux disease)    occ. take prevacid  . History of GI diverticular bleed april 2012   transfused blood and resolved without surgical intervention  . Hypertension   . Impaired hearing bilateral    only left hearing aid  . Impotence, organic    s/p penile prosthesis 1990's  . Incomplete bladder emptying   . LBBB (left bundle branch block)   . Meningioma ([place]) right -sided w/ right VI palsy   followed by dr Gaynell Face  . Mitral regurgitation    a. Mild-mod by echo 12/2014.  [name] Myocardial infarction [facility]) 1980's- medical intervention   "so mild I didn't know I'd had it"  . PAF (paroxysmal atrial fibrillation) ([facility])    not on coumadin due to hx of GI bleed.  . Prostate cancer ([place]) 11/30/13   Gleason 8, volume 22.14 cc  . Sleep apnea    non-compliant cpap    ROS:   All systems reviewed and negative except as noted in the HPI.   Past Surgical History:  Procedure Laterality Date  . APPENDECTOMY    . CARDIAC CATHETERIZATION  2007   noncritical cad (results w/ chart)  . CARDIOVERSION N/A 10/13/2015   Procedure: CARDIOVERSION;  Surgeon: Josue Hector, MD;  Location: [place];  Service: Cardiovascular;  Laterality: N/A;  . CATARACT EXTRACTION W/ INTRAOCULAR LENS  IMPLANT, BILATERAL Bilateral ~ 2000  .  CHOLECYSTECTOMY    . CLOSED REDUCTION HIP DISLOCATION Right "several"  . CORONARY ANGIOPLASTY WITH STENT PLACEMENT  08-03-08   drug-eluting stent x2 distal and mid lad  . EP IMPLANTABLE DEVICE N/A 01/31/2015   Procedure: BiV Pacemaker Insertion CRT-P;  Surgeon: Evans Lance, MD;  Location: [place] CV LAB;  Service: Cardiovascular;  Laterality: N/A;  .  EP IMPLANTABLE DEVICE N/A 02/07/2015   Procedure: Lead Revision/Repair;  Surgeon: Deboraha Sprang, MD;  Location: [place] CV LAB;  Service: Cardiovascular;  Laterality: N/A;  . ESOPHAGOGASTRODUODENOSCOPY N/A 02/11/2014   Procedure: ESOPHAGOGASTRODUODENOSCOPY (EGD);  Surgeon: Cleotis Nipper, MD;  Location[facility] ENDOSCOPY;  Service: Endoscopy;  Laterality: N/A;  . gamma knife radiation  2000   [facility] for meningioma, last eval 2013- no change  . INGUINAL HERNIA REPAIR Bilateral   . INNER EAR SURGERY Right yrs ago   "trying to get my hearing back  . LEFT HEART CATHETERIZATION WITH CORONARY ANGIOGRAM N/A 09/04/2013   Procedure: LEFT HEART CATHETERIZATION WITH CORONARY ANGIOGRAM;  Surgeon: Jettie Booze, MD;  Location[facility] CATH LAB;  Service: Cardiovascular;  Laterality: N/A;  . PENILE PROSTHESIS IMPLANT  1990's  . PERCUTANEOUS CORONARY ROTOBLATOR INTERVENTION (PCI-R) N/A 09/08/2013   Procedure: PERCUTANEOUS CORONARY ROTOBLATOR INTERVENTION (PCI-R);  Surgeon: Jettie Booze, MD;  Location: [facility] CATH LAB;  Service: Cardiovascular;  Laterality: N/A;  . PROSTATE BIOPSY  11/30/13   Gleason 8, vol 22.14 cc  . REVISION TOTAL KNEE ARTHROPLASTY Right   . SHOULDER OPEN ROTATOR CUFF REPAIR Left   . TOTAL HIP ARTHROPLASTY Right 03-25-08--  . TOTAL HIP ARTHROPLASTY Left 2005  . TOTAL HIP REVISION Right 3-4 times  . TOTAL KNEE ARTHROPLASTY Right 2004  . TOTAL KNEE ARTHROPLASTY Left 1997  . TRANSURETHRAL RESECTION OF PROSTATE  "years ago"  . TRANSURETHRAL RESECTION OF PROSTATE  01/08/2011   Procedure: TRANSURETHRAL RESECTION OF THE PROSTATE  (TURP);  Surgeon: Franchot Gallo;  Location: [place];  Service: Urology;  Laterality: N/A;  GYRUS      Family History  Problem Relation Age of Onset  . Heart disease Mother   . Heart attack Mother   . Emphysema Father   . Cancer Brother     liver cancer  . Cancer       Social History   Social History  . Marital status: Widowed    Spouse name: N/A  . Number of children: N/A  . Years of education: N/A   Occupational History  . Not on file.   Social History Main Topics  . Smoking status: Former Smoker    Packs/day: 1.00    Years: 14.00    Types: Cigarettes    Quit date: 01/05/1959  . Smokeless tobacco: Never Used  . Alcohol use 0.0 oz/week     Comment: 02/09/2014 "I'm not drinking anymore"  . Drug use: No  . Sexual activity: No   Other Topics Concern  . Not on file   Social History Narrative  . No narrative on file     BP 130/80   Pulse 76   Ht 5\' 8"  (1.727 m)   Wt 181 lb (82.1 kg)   BMI 27.52 kg/m   Physical Exam:  Chronically ill appearing elderly man, NAD HEENT: Unremarkable Neck:  7 cm JVD, no thyromegally Back:  No CVA tenderness Lungs:  Clear with no wheezes, rales, or rhonchi. HEART:  IRegular rate rhythm, no murmurs, no rubs, no clicks Abd:  soft, positive bowel sounds, no organomegally, no rebound, no guarding Ext:  2 plus pulses, no edema, no cyanosis, no clubbing Skin:  No rashes no nodules Neuro:  CN II through XII intact, motor grossly intact  PM interogation - pacing less than 65% of the time. He was pacing subthreshold.  Assess/Plan: 1. Chronic systolic heart failure - his symptoms are 2B. He is encouraged to continue  his oral lasix.  2. Atrial fib - attempts to get him back into NSR have been unsuccessful. Will stop amiodarone.  3. PM - today we reprogrammed his device to assure capture and avoid diaphragmatic stimulation.  4. Coags/Bleeding - he has taken the Eliquis only intermittently. I have asked him to  stop due to bleeding risk and to start him back on Plavix which has was able to take in the past.   Mikle Bosworth.D.

## 2015-11-16 LAB — CUP PACEART INCLINIC DEVICE CHECK
Date Time Interrogation Session: 20170920093537
Implantable Lead Implant Date: 20161205
Implantable Lead Implant Date: 20161205
Implantable Lead Location: 753858
Implantable Lead Location: 753860
Lead Channel Pacing Threshold Amplitude: 0.75 V
Lead Channel Pacing Threshold Pulse Width: 1 ms
MDC IDC LEAD IMPLANT DT: 20161205
MDC IDC LEAD LOCATION: 753859
MDC IDC MSMT LEADCHNL LV PACING THRESHOLD AMPLITUDE: 1.25 V
MDC IDC MSMT LEADCHNL RA SENSING INTR AMPL: 0.7 mV
MDC IDC MSMT LEADCHNL RV PACING THRESHOLD PULSEWIDTH: 0.5 ms
MDC IDC MSMT LEADCHNL RV SENSING INTR AMPL: 12 mV — AB
Pulse Gen Model: 3262
Pulse Gen Serial Number: 7802901

## 2015-11-18 ENCOUNTER — Telehealth: Payer: Self-pay | Admitting: Internal Medicine

## 2015-11-18 NOTE — Telephone Encounter (Signed)
Per pt "I'm not doing good" - pt reports nausea pill, sleeping pill and fluid pill all affect my heart and make my HR come down.  Pt then gave me permission to speak with his caregiver and handed her the phone.  She is reporting pt's BP at times drops after taking both Furosemide and Losartan.  Example given yesterday 125/80 to 100/60. His BP did drop to 94/60 one evening and she held his pm meds.  Pt has not been weighing daily and will start this.  Instructed to weigh every AM at the same time and in the same amount of clothing.  Caregiver states pt has been coughing in the evening and first thing in the morning-occasionally with white phlegm.  He is able to lay flat and only uses 1 pillow at night.  There is no edema at feet/ankles.  Advised to continue medications as listed, to monitor BP/HR daily 1 hr after medication, make position changes carefully.  Caregiver states understanding.  Aware I will forward information MD for review and further recommendations.

## 2015-11-18 NOTE — Telephone Encounter (Signed)
New message     Pt c/o BP issue: STAT if pt c/o blurred vision, one-sided weakness or slurred speech  1. What are your last 5 BP readings?94/60  100/60 hr 85  2. Are you having any other symptoms (ex. Dizziness, headache, blurred vision, passed out)? yes  3. What is your BP issue? Pt home health rn verbalized that pt is having mucus come up when he coughs, and pt is not urinating much even though he is on water pills for his ankles and the bp is down

## 2015-11-22 NOTE — Telephone Encounter (Signed)
Discussed with [name] and he would like for him to stop the Losartan and see if this will help with his symptoms and drop of his BP.   He is going to do this and let me know in a few weeks if this does not help

## 2015-11-28 DIAGNOSIS — R351 Nocturia: Secondary | ICD-10-CM | POA: Diagnosis not present

## 2015-11-28 DIAGNOSIS — C61 Malignant neoplasm of prostate: Secondary | ICD-10-CM | POA: Diagnosis not present

## 2015-11-28 DIAGNOSIS — N320 Bladder-neck obstruction: Secondary | ICD-10-CM | POA: Diagnosis not present

## 2015-11-30 MED ORDER — SODIUM CHLORIDE 0.9 % IJ SOLN: 10.0000 mL | Freq: Once | INTRAMUSCULAR | Status: DC

## 2015-12-02 ENCOUNTER — Telehealth: Payer: Self-pay | Admitting: Internal Medicine

## 2015-12-02 NOTE — Telephone Encounter (Signed)
Returned patient call. Patient requested lorazepam from Dr. Lovena Le. He additionally was concerned that his HR was 62 stating that he felt better when it was in the 70's and feared his pacemaker was not working. I informed him that he would need to call his primary care doctor for his lorazepam prescription. I also told him that his pacemaker was set to keep his HR above 60 so his HR was within normal limits but that he could help raise his HR by increasing his activity level. He verbalized appreciation and stated he would call is PCP for the lorazepam prescription.

## 2015-12-02 NOTE — Telephone Encounter (Signed)
New Message  Pt call requesting to speak with RN. Pt states it was pertaining  to his pacemaker. Pt did not want to disclose any further information. Please call back to discuss

## 2016-01-06 ENCOUNTER — Observation Stay (HOSPITAL_COMMUNITY)
Admission: EM | Admit: 2016-01-06 | Discharge: 2016-01-07 | Disposition: A | Discharge status: Home / Self Care | Payer: Commercial Managed Care - HMO | Attending: Cardiology | Admitting: Cardiology

## 2016-01-06 ENCOUNTER — Emergency Department (HOSPITAL_COMMUNITY): Payer: Commercial Managed Care - HMO

## 2016-01-06 ENCOUNTER — Encounter (HOSPITAL_COMMUNITY): Payer: Self-pay | Admitting: *Deleted

## 2016-01-06 DIAGNOSIS — I252 Old myocardial infarction: Secondary | ICD-10-CM | POA: Diagnosis not present

## 2016-01-06 DIAGNOSIS — I130 Hypertensive heart and chronic kidney disease with heart failure and stage 1 through stage 4 chronic kidney disease, or unspecified chronic kidney disease: Principal | ICD-10-CM | POA: Insufficient documentation

## 2016-01-06 DIAGNOSIS — I480 Paroxysmal atrial fibrillation: Secondary | ICD-10-CM | POA: Insufficient documentation

## 2016-01-06 DIAGNOSIS — N183 Chronic kidney disease, stage 3 (moderate): Secondary | ICD-10-CM | POA: Insufficient documentation

## 2016-01-06 DIAGNOSIS — K219 Gastro-esophageal reflux disease without esophagitis: Secondary | ICD-10-CM | POA: Insufficient documentation

## 2016-01-06 DIAGNOSIS — Z955 Presence of coronary angioplasty implant and graft: Secondary | ICD-10-CM | POA: Insufficient documentation

## 2016-01-06 DIAGNOSIS — Z885 Allergy status to narcotic agent status: Secondary | ICD-10-CM | POA: Insufficient documentation

## 2016-01-06 DIAGNOSIS — Z888 Allergy status to other drugs, medicaments and biological substances status: Secondary | ICD-10-CM | POA: Diagnosis not present

## 2016-01-06 DIAGNOSIS — Z7902 Long term (current) use of antithrombotics/antiplatelets: Secondary | ICD-10-CM | POA: Insufficient documentation

## 2016-01-06 DIAGNOSIS — F419 Anxiety disorder, unspecified: Secondary | ICD-10-CM | POA: Diagnosis not present

## 2016-01-06 DIAGNOSIS — H9190 Unspecified hearing loss, unspecified ear: Secondary | ICD-10-CM | POA: Insufficient documentation

## 2016-01-06 DIAGNOSIS — Z9842 Cataract extraction status, left eye: Secondary | ICD-10-CM | POA: Diagnosis not present

## 2016-01-06 DIAGNOSIS — I5043 Acute on chronic combined systolic (congestive) and diastolic (congestive) heart failure: Secondary | ICD-10-CM | POA: Insufficient documentation

## 2016-01-06 DIAGNOSIS — Z87891 Personal history of nicotine dependence: Secondary | ICD-10-CM | POA: Insufficient documentation

## 2016-01-06 DIAGNOSIS — I482 Chronic atrial fibrillation: Secondary | ICD-10-CM | POA: Insufficient documentation

## 2016-01-06 DIAGNOSIS — Z8719 Personal history of other diseases of the digestive system: Secondary | ICD-10-CM | POA: Insufficient documentation

## 2016-01-06 DIAGNOSIS — Z800 Family history of malignant neoplasm of digestive organs: Secondary | ICD-10-CM | POA: Insufficient documentation

## 2016-01-06 DIAGNOSIS — G4730 Sleep apnea, unspecified: Secondary | ICD-10-CM | POA: Insufficient documentation

## 2016-01-06 DIAGNOSIS — I700 Atherosclerosis of aorta: Secondary | ICD-10-CM | POA: Diagnosis not present

## 2016-01-06 DIAGNOSIS — I452 Bifascicular block: Secondary | ICD-10-CM | POA: Diagnosis not present

## 2016-01-06 DIAGNOSIS — I340 Nonrheumatic mitral (valve) insufficiency: Secondary | ICD-10-CM | POA: Insufficient documentation

## 2016-01-06 DIAGNOSIS — Z961 Presence of intraocular lens: Secondary | ICD-10-CM | POA: Diagnosis not present

## 2016-01-06 DIAGNOSIS — I5022 Chronic systolic (congestive) heart failure: Secondary | ICD-10-CM

## 2016-01-06 DIAGNOSIS — R05 Cough: Secondary | ICD-10-CM | POA: Diagnosis not present

## 2016-01-06 DIAGNOSIS — I2510 Atherosclerotic heart disease of native coronary artery without angina pectoris: Secondary | ICD-10-CM | POA: Insufficient documentation

## 2016-01-06 DIAGNOSIS — Z950 Presence of cardiac pacemaker: Secondary | ICD-10-CM | POA: Insufficient documentation

## 2016-01-06 DIAGNOSIS — Z96653 Presence of artificial knee joint, bilateral: Secondary | ICD-10-CM | POA: Insufficient documentation

## 2016-01-06 DIAGNOSIS — I5042 Chronic combined systolic (congestive) and diastolic (congestive) heart failure: Secondary | ICD-10-CM | POA: Diagnosis present

## 2016-01-06 DIAGNOSIS — Z9119 Patient's noncompliance with other medical treatment and regimen: Secondary | ICD-10-CM | POA: Insufficient documentation

## 2016-01-06 DIAGNOSIS — I4892 Unspecified atrial flutter: Secondary | ICD-10-CM | POA: Diagnosis not present

## 2016-01-06 DIAGNOSIS — R0600 Dyspnea, unspecified: Secondary | ICD-10-CM | POA: Diagnosis present

## 2016-01-06 DIAGNOSIS — Z8249 Family history of ischemic heart disease and other diseases of the circulatory system: Secondary | ICD-10-CM | POA: Insufficient documentation

## 2016-01-06 DIAGNOSIS — I447 Left bundle-branch block, unspecified: Secondary | ICD-10-CM | POA: Insufficient documentation

## 2016-01-06 DIAGNOSIS — Z9841 Cataract extraction status, right eye: Secondary | ICD-10-CM | POA: Diagnosis not present

## 2016-01-06 DIAGNOSIS — I5023 Acute on chronic systolic (congestive) heart failure: Secondary | ICD-10-CM

## 2016-01-06 DIAGNOSIS — Z8546 Personal history of malignant neoplasm of prostate: Secondary | ICD-10-CM | POA: Insufficient documentation

## 2016-01-06 DIAGNOSIS — I4821 Permanent atrial fibrillation: Secondary | ICD-10-CM | POA: Diagnosis present

## 2016-01-06 DIAGNOSIS — Z96643 Presence of artificial hip joint, bilateral: Secondary | ICD-10-CM | POA: Insufficient documentation

## 2016-01-06 HISTORY — DX: Permanent atrial fibrillation: I48.21

## 2016-01-06 LAB — BASIC METABOLIC PANEL
ANION GAP: 8 (ref 5–15)
BUN: 15 mg/dL (ref 6–20)
CALCIUM: 9.1 mg/dL (ref 8.9–10.3)
CO2: 23 mmol/L (ref 22–32)
Chloride: 99 mmol/L — ABNORMAL LOW (ref 101–111)
Creatinine, Ser: 1.1 mg/dL (ref 0.61–1.24)
GFR, EST NON AFRICAN AMERICAN: 58 mL/min — AB (ref >60)
GLUCOSE: 115 mg/dL — AB (ref 65–99)
Potassium: 4.5 mmol/L (ref 3.5–5.1)
Sodium: 130 mmol/L — ABNORMAL LOW (ref 135–145)

## 2016-01-06 LAB — CBC
HCT: 38.7 % — ABNORMAL LOW (ref 39.0–52.0)
HEMOGLOBIN: 13.3 g/dL (ref 13.0–17.0)
MCH: 31.9 pg (ref 26.0–34.0)
MCHC: 34.4 g/dL (ref 30.0–36.0)
MCV: 92.8 fL (ref 78.0–100.0)
Platelets: 171 10*3/uL (ref 150–400)
RBC: 4.17 MIL/uL — ABNORMAL LOW (ref 4.22–5.81)
RDW: 13.5 % (ref 11.5–15.5)
WBC: 6.4 10*3/uL (ref 4.0–10.5)

## 2016-01-06 LAB — HEPATIC FUNCTION PANEL
ALT: 16 U/L — ABNORMAL LOW (ref 17–63)
AST: 20 U/L (ref 15–41)
Albumin: 3.9 g/dL (ref 3.5–5.0)
Alkaline Phosphatase: 90 U/L (ref 38–126)
BILIRUBIN DIRECT: 0.1 mg/dL (ref 0.1–0.5)
BILIRUBIN INDIRECT: 0.6 mg/dL (ref 0.3–0.9)
Total Bilirubin: 0.7 mg/dL (ref 0.3–1.2)
Total Protein: 5.8 g/dL — ABNORMAL LOW (ref 6.5–8.1)

## 2016-01-06 LAB — I-STAT TROPONIN, ED: TROPONIN I, POC: 0 ng/mL (ref 0.00–0.08)

## 2016-01-06 LAB — BRAIN NATRIURETIC PEPTIDE: B NATRIURETIC PEPTIDE 5: 294.1 pg/mL — AB (ref 0.0–100.0)

## 2016-01-06 MED ORDER — FUROSEMIDE 10 MG/ML IJ SOLN
40.0000 mg | Freq: Once | INTRAMUSCULAR | Status: AC
  Administered 2016-01-07: 40 mg via INTRAVENOUS
  Filled 2016-01-06: qty 4

## 2016-01-06 MED ORDER — TAMSULOSIN HCL 0.4 MG PO CAPS: 0.4000 mg | ORAL_CAPSULE | Freq: Every day | ORAL | Status: DC

## 2016-01-06 MED ORDER — FINASTERIDE 5 MG PO TABS
5.0000 mg | ORAL_TABLET | Freq: Every day | ORAL | Status: DC
  Administered 2016-01-07: 5 mg via ORAL
  Filled 2016-01-06: qty 1

## 2016-01-06 MED ORDER — SODIUM CHLORIDE 0.9 % IV SOLN: 250.0000 mL | INTRAVENOUS | Status: DC | PRN

## 2016-01-06 MED ORDER — ACETAMINOPHEN 325 MG PO TABS: 650.0000 mg | ORAL_TABLET | ORAL | Status: DC | PRN

## 2016-01-06 MED ORDER — SODIUM CHLORIDE 0.9% FLUSH: 3.0000 mL | INTRAVENOUS | Status: DC | PRN

## 2016-01-06 MED ORDER — DORZOLAMIDE HCL-TIMOLOL MAL 2-0.5 % OP SOLN
1.0000 [drp] | Freq: Two times a day (BID) | OPHTHALMIC | Status: DC
  Administered 2016-01-07: 1 [drp] via OPHTHALMIC
  Filled 2016-01-06: qty 10

## 2016-01-06 MED ORDER — CLOPIDOGREL BISULFATE 75 MG PO TABS
75.0000 mg | ORAL_TABLET | Freq: Every day | ORAL | Status: DC
  Administered 2016-01-07: 75 mg via ORAL
  Filled 2016-01-06: qty 1

## 2016-01-06 MED ORDER — BRIMONIDINE TARTRATE 0.2 % OP SOLN
1.0000 [drp] | Freq: Two times a day (BID) | OPHTHALMIC | Status: DC
  Administered 2016-01-07: 1 [drp] via OPHTHALMIC
  Filled 2016-01-06: qty 5

## 2016-01-06 MED ORDER — LORAZEPAM 0.5 MG PO TABS: 0.5000 mg | ORAL_TABLET | Freq: Three times a day (TID) | ORAL | Status: DC | PRN

## 2016-01-06 MED ORDER — SODIUM CHLORIDE 0.9% FLUSH
3.0000 mL | Freq: Two times a day (BID) | INTRAVENOUS | Status: DC
  Administered 2016-01-06 – 2016-01-07 (×2): 3 mL via INTRAVENOUS

## 2016-01-06 MED ORDER — ONDANSETRON HCL 4 MG/2ML IJ SOLN: 4.0000 mg | Freq: Four times a day (QID) | INTRAMUSCULAR | Status: DC | PRN

## 2016-01-06 MED ORDER — FUROSEMIDE 10 MG/ML IJ SOLN
40.0000 mg | Freq: Once | INTRAMUSCULAR | Status: AC
  Administered 2016-01-06: 40 mg via INTRAVENOUS
  Filled 2016-01-06: qty 4

## 2016-01-06 MED ORDER — TRAMADOL HCL 50 MG PO TABS: 50.0000 mg | ORAL_TABLET | Freq: Two times a day (BID) | ORAL | Status: DC | PRN

## 2016-01-06 NOTE — ED Provider Notes (Signed)
[facility] DEPT Provider Note   CSN: [id number] Arrival date & time: 01/06/16  1445     History   Chief Complaint Chief Complaint  Patient presents with  . Chest Pain    HPI [name] is a 82 y.o. male.  HPI 82 year old male with past medical history of ischemic cardiac myopathy, CK D, coronary disease, who presents with worsening cough, shortness of breath, and chest pain. The patient states that over the last month, he has had progressively worsening orthopnea with cough productive of clear sputum. Over the last several days, he has noticed increased lower extremity edema as well as intermittent chest pressure. The pressure also radiates to his left shoulder with an aching sensation. He also has a history of chronic GI pain and is unsure whether it is similar to his usual pain. Denies any fevers or chills. Denies any changes in his medications. He states that he has lost weight recently and denies any other health changes. No fevers or chills. Denies any chest pain currently.  Past Medical History:  Diagnosis Date  . Anxiety   . Arthritis   . Blood transfusion    "related to a surgery"  . BPH (benign prostatic hypertrophy) with urinary obstruction    s/p turp yrs ago  . Bradycardia    a. Amio d/c'd 08/2013; brady arrest 08/2013 after PCI >>> recurrent AF >>> Amiodarone restarted. b. Pacemaker being considered in 11/2014.  [name] CAD (coronary artery disease)    a. s/p MI and prior PCI of LAD;  b. LHC (08/2013):  prox LAD 60-70%, mid LAD stents ok, ostial lesion at Dx jailed by stent, mild CFX and RCA disesase >>>  PCI (09/08/13):  rotational atherectomy + Promus DES to prox LAD  . Cardiomyopathy ([place])    a. Echo (08/2013):  EF 30-35%, AS hypokinesis, Gr 1 diast dysfn, mild MR, mild LAE >>> b. improved EF 50-55% by echo 8/15. c. EF down again by echo 12/2014 to 30-35% but 51% by nuc.  [name] Chronic lower back pain   . Chronic systolic CHF (congestive heart failure) ([place])   . CKD  (chronic kidney disease), stage III    a. Per review of labs baseline Cr 1.1-1.3.  [name] DDD (degenerative disc disease)    chronic back pain  . Depression   . Diverticulitis of colon with bleeding    s/p sigmoid resection '88  . DJD (degenerative joint disease) hips and knees   s/p bilateral total replacements  . GERD (gastroesophageal reflux disease)    occ. take prevacid  . History of GI diverticular bleed april 2012   transfused blood and resolved without surgical intervention  . Hypertension   . Impaired hearing bilateral    only left hearing aid  . Impotence, organic    s/p penile prosthesis 1990's  . Incomplete bladder emptying   . LBBB (left bundle branch block)   . Meningioma ([place]) right -sided w/ right VI palsy   followed by dr Gaynell Face  . Mitral regurgitation    a. Mild-mod by echo 12/2014.  [name] Myocardial infarction 1980's- medical intervention   "so mild I didn't know I'd had it"  . PAF (paroxysmal atrial fibrillation) (HCC)    not on coumadin due to hx of GI bleed.  . Prostate cancer ([place]) 11/30/13   Gleason 8, volume 22.14 cc  . Sleep apnea    non-compliant cpap    Patient Active Problem List   Diagnosis Date Noted  . Acute on chronic  combined systolic and diastolic ACC/AHA stage C congestive heart failure ([place]) 01/06/2016  . Orthostatic hypotension 02/06/2015  . Weakness 01/05/2015  . History of GI bleed 01/05/2015  . Hypokalemia 01/05/2015  . Chronic systolic CHF (congestive heart failure) ([place]) 01/03/2015  . Dyspnea 11/30/2014  . Fatigue 02/22/2014  . Coronary artery disease involving native coronary artery of native heart without angina pectoris   . Prostate cancer ([place])   . GI bleed 02/09/2014  . Chest pain 02/09/2014  . Malignant neoplasm of prostate ([place]) 12/15/2013  . Other malaise and fatigue 11/16/2013  . Chronic systolic heart failure ([place]) 10/12/2013  . Left bundle branch block 10/12/2013  . PAF (paroxysmal atrial fibrillation) ([place]) 09/05/2013    . Angina, class III ([place]) 09/05/2013  . R wrist hematoma following coronary angiography   . Bradycardia   . Coronary atherosclerosis of native coronary artery 09/03/2013  . Mitral valve disorder 07/10/2013  . Essential hypertension, benign 07/10/2013  . Cough 07/10/2013  . Nodular prostate without urinary obstruction 01/08/2011  . Hypertrophy of prostate without urinary obstruction and other lower urinary tract symptoms (LUTS) 01/08/2011  . Impotence of organic origin 01/08/2011    Past Surgical History:  Procedure Laterality Date  . APPENDECTOMY    . CARDIAC CATHETERIZATION  2007   noncritical cad (results w/ chart)  . CARDIOVERSION N/A 10/13/2015   Procedure: CARDIOVERSION;  Surgeon: Josue Hector, MD;  Location: [facility];  Service: Cardiovascular;  Laterality: N/A;  . CATARACT EXTRACTION W/ INTRAOCULAR LENS  IMPLANT, BILATERAL Bilateral ~ 2000  . CHOLECYSTECTOMY    . CLOSED REDUCTION HIP DISLOCATION Right "several"  . CORONARY ANGIOPLASTY WITH STENT PLACEMENT  08-03-08   drug-eluting stent x2 distal and mid lad  . EP IMPLANTABLE DEVICE N/A 01/31/2015   Procedure: BiV Pacemaker Insertion CRT-P;  Surgeon: Evans Lance, MD;  Location: [facility];  Service: Cardiovascular;  Laterality: N/A;  . EP IMPLANTABLE DEVICE N/A 02/07/2015   Procedure: Lead Revision/Repair;  Surgeon: Deboraha Sprang, MD;  Location: [facility];  Service: Cardiovascular;  Laterality: N/A;  . ESOPHAGOGASTRODUODENOSCOPY N/A 02/11/2014   Procedure: ESOPHAGOGASTRODUODENOSCOPY (EGD);  Surgeon: Cleotis Nipper, MD;  Location[facility] ENDOSCOPY;  Service: Endoscopy;  Laterality: N/A;  . gamma knife radiation  2000   [facility] for meningioma, last eval 2013- no change  . INGUINAL HERNIA REPAIR Bilateral   . INNER EAR SURGERY Right yrs ago   "trying to get my hearing back  . LEFT HEART CATHETERIZATION WITH CORONARY ANGIOGRAM N/A 09/04/2013   Procedure: LEFT HEART CATHETERIZATION WITH CORONARY ANGIOGRAM;   Surgeon: Jettie Booze, MD;  Location[facility] LAB;  Service: Cardiovascular;  Laterality: N/A;  . PENILE PROSTHESIS IMPLANT  1990's  . PERCUTANEOUS CORONARY ROTOBLATOR INTERVENTION (PCI-R) N/A 09/08/2013   Procedure: PERCUTANEOUS CORONARY ROTOBLATOR INTERVENTION (PCI-R);  Surgeon: Jettie Booze, MD;  Location: [facility] CATH LAB;  Service: Cardiovascular;  Laterality: N/A;  . PROSTATE BIOPSY  11/30/13   Gleason 8, vol 22.14 cc  . REVISION TOTAL KNEE ARTHROPLASTY Right   . SHOULDER OPEN ROTATOR CUFF REPAIR Left   . TOTAL HIP ARTHROPLASTY Right 03-25-08--  . TOTAL HIP ARTHROPLASTY Left 2005  . TOTAL HIP REVISION Right 3-4 times  . TOTAL KNEE ARTHROPLASTY Right 2004  . TOTAL KNEE ARTHROPLASTY Left 1997  . TRANSURETHRAL RESECTION OF PROSTATE  "years ago"  . TRANSURETHRAL RESECTION OF PROSTATE  01/08/2011   Procedure: TRANSURETHRAL RESECTION OF THE PROSTATE (TURP);  Surgeon: Franchot Gallo;  Location: [facility];  Service: Urology;  Laterality: N/A;  GYRUS        Home Medications    Prior to Admission medications   Medication Sig Start Date End Date Taking? Authorizing Provider  acetaminophen (TYLENOL) 500 MG tablet Take 1,000 mg by mouth every 6 (six) hours as needed for headache (pain).    Yes Historical Provider, MD  amoxicillin (AMOXIL) 500 MG capsule Take 2,000 mg by mouth See admin instructions. Take 4 capsules (2000 mg) by mouth one hour prior to dental appointment   Yes Historical Provider, MD  brimonidine (ALPHAGAN) 0.15 % ophthalmic solution Place 1 drop into both eyes 2 (two) times daily. 08/11/15  Yes Historical Provider, MD  clopidogrel (PLAVIX) 75 MG tablet Take 37.5 mg by mouth daily.    Yes Historical Provider, MD  dorzolamide-timolol (COSOPT) 22.3-6.8 MG/ML ophthalmic solution Place 1 drop into both eyes 2 (two) times daily.  11/12/13  Yes Historical Provider, MD  Flaxseed, Linseed, (FLAX SEEDS PO) Take 15 mLs by mouth daily. Grind up seeds and take  with honey   Yes Historical Provider, MD  fluticasone (FLONASE) 50 MCG/ACT nasal spray Place 2 sprays into both nostrils daily as needed for allergies.  12/08/13  Yes Historical Provider, MD  furosemide (LASIX) 20 MG tablet Take 20 mg by mouth daily.    Yes Historical Provider, MD  LORazepam (ATIVAN) 0.5 MG tablet Take 0.5 mg by mouth at bedtime as needed for sleep.    Yes Historical Provider, MD  Multiple Vitamins-Minerals (EMERGEN-C VITAMIN C) PACK Take 1,000 mg by mouth daily after lunch.   Yes Historical Provider, MD  Multiple Vitamins-Minerals (PRESERVISION AREDS 2) CAPS Take 1 capsule by mouth daily after lunch.   Yes Historical Provider, MD  nitroGLYCERIN (NITROSTAT) 0.4 MG SL tablet Place 1 tablet (0.4 mg total) under the tongue every 5 (five) minutes as needed for chest pain. 09/21/13  Yes Liliane Shi, PA-C  Omega-3 Fatty Acids (FISH OIL PO) Take 1 capsule by mouth daily after lunch.   Yes Historical Provider, MD  polyethylene glycol (MIRALAX / GLYCOLAX) packet Take 17 g by mouth daily as needed (constipation). Mix in 8 oz liquid and drink   Yes Historical Provider, MD  potassium chloride (K-DUR) 10 MEQ tablet Take 5 mEq by mouth daily.    Yes Historical Provider, MD  Probiotic Product (PROBIOTIC PO) Take 1 tablet by mouth 2 (two) times daily.    Yes Historical Provider, MD  psyllium (METAMUCIL) 58.6 % packet Take 1 packet by mouth daily with supper. Mix in 8 oz liquid and drink   Yes Historical Provider, MD  losartan (COZAAR) 25 MG tablet Take 0.5 tablets (12.5 mg total) by mouth daily. Patient not taking: Reported on 01/06/2016 07/15/15   Jettie Booze, MD  ondansetron (ZOFRAN-ODT) 4 MG disintegrating tablet Take 1 tablet (4 mg total) by mouth every 8 (eight) hours as needed for nausea or vomiting. Patient not taking: Reported on 01/06/2016 11/02/15   Davonna Belling, MD    Family History Family History  Problem Relation Age of Onset  . Heart disease Mother   . Heart attack  Mother   . Emphysema Father   . Cancer Brother     liver cancer  . Cancer      Social History Social History  Substance Use Topics  . Smoking status: Former Smoker    Packs/day: 1.00    Years: 14.00    Types: Cigarettes    Quit date: 01/05/1959  .  Smokeless tobacco: Never Used  . Alcohol use 0.0 oz/week     Comment: 02/09/2014 "I'm not drinking anymore"     Allergies   Protonix [pantoprazole sodium]; Codeine; Prilosec [omeprazole]; and Warfarin sodium   Review of Systems Review of Systems  Constitutional: Positive for fatigue. Negative for chills and fever.  HENT: Negative for congestion and rhinorrhea.   Eyes: Negative for visual disturbance.  Respiratory: Positive for chest tightness and shortness of breath. Negative for cough and wheezing.   Cardiovascular: Positive for leg swelling. Negative for chest pain.  Gastrointestinal: Negative for abdominal pain, diarrhea, nausea and vomiting.  Genitourinary: Negative for dysuria and flank pain.  Musculoskeletal: Negative for neck pain and neck stiffness.  Skin: Negative for rash and wound.  Allergic/Immunologic: Negative for immunocompromised state.  Neurological: Negative for syncope, weakness and headaches.  All other systems reviewed and are negative.    Physical Exam Updated Vital Signs BP 109/63 (BP Location: Left Arm)   Pulse 75   Temp 98.5 F (36.9 C) (Oral)   Resp 18   Ht 5\' 8"  (1.727 m)   Wt 180 lb 9.6 oz (81.9 kg)   SpO2 97%   BMI 27.46 kg/m   Physical Exam  Constitutional: He is oriented to person, place, and time. He appears well-developed and well-nourished. No distress.  HENT:  Head: Normocephalic and atraumatic.  Eyes: Conjunctivae are normal.  Neck: Neck supple.  Cardiovascular: Normal rate, regular rhythm and normal heart sounds.  Exam reveals no friction rub.   No murmur heard. Pulmonary/Chest: Effort normal. No respiratory distress. He has no wheezes. He has rales (Bibasilar).  Abdominal:  Soft. He exhibits no distension.  Musculoskeletal: He exhibits edema (2+ pitting, symmetric lower extremity edema).  Neurological: He is alert and oriented to person, place, and time. He exhibits normal muscle tone.  Skin: Skin is warm. Capillary refill takes less than 2 seconds.  Psychiatric: He has a normal mood and affect.  Nursing note and vitals reviewed.    ED Treatments / Results  Labs (all labs ordered are listed, but only abnormal results are displayed) Labs Reviewed  BASIC METABOLIC PANEL - Abnormal; Notable for the following:       Result Value   Sodium 130 (*)    Chloride 99 (*)    Glucose, Bld 115 (*)    GFR calc non Af Amer 58 (*)    All other components within normal limits  CBC - Abnormal; Notable for the following:    RBC 4.17 (*)    HCT 38.7 (*)    All other components within normal limits  HEPATIC FUNCTION PANEL - Abnormal; Notable for the following:    Total Protein 5.8 (*)    ALT 16 (*)    All other components within normal limits  BRAIN NATRIURETIC PEPTIDE - Abnormal; Notable for the following:    B Natriuretic Peptide 294.1 (*)    All other components within normal limits  BASIC METABOLIC PANEL  I-STAT TROPOININ, ED    EKG  EKG Interpretation  Date/Time:  Friday January 06 2016 14:56:17 EST Ventricular Rate:  75 PR Interval:    QRS Duration: 150 QT Interval:  448 QTC Calculation: 500 R Axis:   92 Text Interpretation:  Ventricular-paced rhythm Abnormal ECG Right bundle branch block no concoordant changes ? changes in ST morphology in aVL Otherwise no significant change Confirmed by FLOYD MD, Quillian Quince [phone]) on 01/06/2016 3:00:49 PM Also confirmed by Tyrone Nine MD, DANIEL [phone]), editor Rolla Plate, Joelene Millin [phone])  on 01/06/2016 3:17:19 PM       Radiology Dg Chest 2 View  Result Date: 01/06/2016 CLINICAL DATA:  82 year old male with a history of productive cough and swollen ankles EXAM: CHEST  2 VIEW COMPARISON:  11/02/2015, 05/19/2015, CT abdomen  11/02/2015 FINDINGS: Cardiomediastinal silhouette unchanged in size and contour. Unchanged cardiac pacing device on the left chest wall. Stenting of coronary arteries. Calcifications of the aortic arch. No confluent airspace disease.  No pneumothorax or pleural effusion. Linear opacities of the base of the right lung unchanged from prior with associated asymmetric elevation of the right hemidiaphragm. Degenerative changes of the thoracic spine.  No displaced fracture. IMPRESSION: Chronic lung changes without evidence of superimposed acute cardiopulmonary disease. Aortic atherosclerosis. Signed, Dulcy Fanny. Earleen Newport, DO [facility] Radiology Electronically Signed   By: Corrie Mckusick D.O.   On: 01/06/2016 16:12    Procedures Procedures (including critical care time)  Medications Ordered in ED Medications  clopidogrel (PLAVIX) tablet 75 mg (not administered)  brimonidine (ALPHAGAN) 0.2 % ophthalmic solution 1 drop (not administered)  tamsulosin (FLOMAX) capsule 0.4 mg (not administered)  LORazepam (ATIVAN) tablet 0.5 mg (not administered)  traMADol (ULTRAM) tablet 50 mg (not administered)  dorzolamide-timolol (COSOPT) 22.3-6.8 MG/ML ophthalmic solution 1 drop (not administered)  finasteride (PROSCAR) tablet 5 mg (not administered)  sodium chloride flush (NS) 0.9 % injection 3 mL (not administered)  sodium chloride flush (NS) 0.9 % injection 3 mL (not administered)  0.9 %  sodium chloride infusion (not administered)  acetaminophen (TYLENOL) tablet 650 mg (not administered)  ondansetron (ZOFRAN) injection 4 mg (not administered)  furosemide (LASIX) injection 40 mg (not administered)  furosemide (LASIX) injection 40 mg (40 mg Intravenous Given 01/06/16 1650)     Initial Impression / Assessment and Plan / ED Course  I have reviewed the triage vital signs and the nursing notes.  Pertinent labs & imaging results that were available during my care of the  patient were reviewed by me and considered in my medical decision making (see chart for details).  Clinical Course     82 year old male with past medical history of ischemic heart myopathy and CHF who presents with progressively worsening intermittent chest pain, shortness of breath, lower extremity edema, and orthopnea. On arrival, vital signs are stable. Examination shows pitting edema and mild bibasilar rales. Chest x-ray is without overt edema but BNP is elevated. Troponin negative. EKG is nonischemic. Suspect moderate acute CHF exacerbation. Discussed with cardiology, will admit for diuresis and monitoring.  Final Clinical Impressions(s) / ED Diagnoses   Final diagnoses:  Acute on chronic systolic congestive heart failure [facility])    New Prescriptions Current Discharge Medication List       Duffy Bruce, MD 01/07/16 [phone]

## 2016-01-06 NOTE — H&P (Signed)
History & Physical    Patient ID: EVELYN KUYPER MRN: [id number], DOB/AGE: [birth year]   Admit date: 01/06/2016  Primary Physician: Wenda Low, MD Primary Cardiologist: Dr. Irish Lack  Patient Profile    82 yo male with PMH of permanet atrial fib , bradycardia, LBBB, past GI bleed, and CAD who presented with c/o increased dyspnea and orthopnea over the past 2 months.     Past Medical History    Past Medical History:  Diagnosis Date  . Anxiety   . Arthritis   . Blood transfusion    "related to a surgery"  . BPH (benign prostatic hypertrophy) with urinary obstruction    s/p turp yrs ago  . Bradycardia    a. Amio d/c'd 08/2013; brady arrest 08/2013 after PCI >>> recurrent AF >>> Amiodarone restarted. b. Pacemaker being considered in 11/2014.  [name] CAD (coronary artery disease)    a. s/p MI and prior PCI of LAD;  b. LHC (08/2013):  prox LAD 60-70%, mid LAD stents ok, ostial lesion at Dx jailed by stent, mild CFX and RCA disesase >>>  PCI (09/08/13):  rotational atherectomy + Promus DES to prox LAD  . Cardiomyopathy ([place])    a. Echo (08/2013):  EF 30-35%, AS hypokinesis, Gr 1 diast dysfn, mild MR, mild LAE >>> b. improved EF 50-55% by echo 8/15. c. EF down again by echo 12/2014 to 30-35% but 51% by nuc.  [name] Chronic lower back pain   . Chronic systolic CHF (congestive heart failure) ([place])   . CKD (chronic kidney disease), stage III    a. Per review of labs baseline Cr 1.1-1.3.  [name] DDD (degenerative disc disease)    chronic back pain  . Depression   . Diverticulitis of colon with bleeding    s/p sigmoid resection '88  . DJD (degenerative joint disease) hips and knees   s/p bilateral total replacements  . GERD (gastroesophageal reflux disease)    occ. take prevacid  . History of GI diverticular bleed april 2012   transfused blood and resolved without surgical intervention  . Hypertension   . Impaired hearing bilateral    only left hearing aid  . Impotence, organic    s/p penile  prosthesis 1990's  . Incomplete bladder emptying   . LBBB (left bundle branch block)   . Meningioma ([place]) right -sided w/ right VI palsy   followed by dr Gaynell Face  . Mitral regurgitation    a. Mild-mod by echo 12/2014.  [name] Myocardial infarction 1980's- medical intervention   "so mild I didn't know I'd had it"  . PAF (paroxysmal atrial fibrillation) (H[facility])    not on coumadin due to hx of GI bleed.  . Prostate cancer ([place]) 11/30/13   Gleason 8, volume 22.14 cc  . Sleep apnea    non-compliant cpap    Past Surgical History:  Procedure Laterality Date  . APPENDECTOMY    . CARDIAC CATHETERIZATION  2007   noncritical cad (results w/ chart)  . CARDIOVERSION N/A 10/13/2015   Procedure: CARDIOVERSION;  Surgeon: Josue Hector, MD;  Location: [facility];  Service: Cardiovascular;  Laterality: N/A;  . CATARACT EXTRACTION W/ INTRAOCULAR LENS  IMPLANT, BILATERAL Bilateral ~ 2000  . CHOLECYSTECTOMY    . CLOSED REDUCTION HIP DISLOCATION Right "several"  . CORONARY ANGIOPLASTY WITH STENT PLACEMENT  08-03-08   drug-eluting stent x2 distal and mid lad  . EP IMPLANTABLE DEVICE N/A 01/31/2015   Procedure: BiV Pacemaker Insertion CRT-P;  Surgeon: Evans Lance, MD;  Location:  [facility];  Service: Cardiovascular;  Laterality: N/A;  . EP IMPLANTABLE DEVICE N/A 02/07/2015   Procedure: Lead Revision/Repair;  Surgeon: Deboraha Sprang, MD;  Location: [facility] CV [facility];  Service: Cardiovascular;  Laterality: N/A;  . ESOPHAGOGASTRODUODENOSCOPY N/A 02/11/2014   Procedure: ESOPHAGOGASTRODUODENOSCOPY (EGD);  Surgeon: Cleotis Nipper, MD;  Location: [facility] ENDOSCOPY;  Service: Endoscopy;  Laterality: N/A;  . gamma knife radiation  2000   [facility] for meningioma, last eval 2013- no change  . INGUINAL HERNIA REPAIR Bilateral   . INNER EAR SURGERY Right yrs ago   "trying to get my hearing back  . LEFT HEART CATHETERIZATION WITH CORONARY ANGIOGRAM N/A 09/04/2013   Procedure: LEFT HEART CATHETERIZATION WITH  CORONARY ANGIOGRAM;  Surgeon: Jettie Booze, MD;  Location: [facility] CATH LAB;  Service: Cardiovascular;  Laterality: N/A;  . PENILE PROSTHESIS IMPLANT  1990's  . PERCUTANEOUS CORONARY ROTOBLATOR INTERVENTION (PCI-R) N/A 09/08/2013   Procedure: PERCUTANEOUS CORONARY ROTOBLATOR INTERVENTION (PCI-R);  Surgeon: Jettie Booze, MD;  Location: [facility] CATH LAB;  Service: Cardiovascular;  Laterality: N/A;  . PROSTATE BIOPSY  11/30/13   Gleason 8, vol 22.14 cc  . REVISION TOTAL KNEE ARTHROPLASTY Right   . SHOULDER OPEN ROTATOR CUFF REPAIR Left   . TOTAL HIP ARTHROPLASTY Right 03-25-08--  . TOTAL HIP ARTHROPLASTY Left 2005  . TOTAL HIP REVISION Right 3-4 times  . TOTAL KNEE ARTHROPLASTY Right 2004  . TOTAL KNEE ARTHROPLASTY Left 1997  . TRANSURETHRAL RESECTION OF PROSTATE  "years ago"  . TRANSURETHRAL RESECTION OF PROSTATE  01/08/2011   Procedure: TRANSURETHRAL RESECTION OF THE PROSTATE (TURP);  Surgeon: Franchot Gallo;  Location: [place] [facility];  Service: Urology;  Laterality: N/A;  GYRUS      Allergies  Allergies  Allergen Reactions  . Protonix [Pantoprazole Sodium] Other (See Comments)    Gi upset, insomnia  . Codeine Anxiety and Other (See Comments)    Insomnia/ hyper  . Prilosec [Omeprazole] Nausea And Vomiting and Other (See Comments)    GI upset, insomnia   . Warfarin Sodium Anxiety and Other (See Comments)    Hx gi bleed     History of Present Illness    82 yo male with PMH of permanet AF, bradycardia s/p PPM, LBBB, past GI bleed, and CAD. He had a complex PCI of his LAD in July 2015 involving rotational atherectomy and DES. Prior to the intervention, his ejection fraction was in the 30-35% range.Echocardiogram about a month after the intervention showed an improvement in his EF from 50-55%. He has not been on chronic anticoagulation due to prior history of GI bleeding. His most recent ischemic eval was a NST performed in April 2016 for atypical chest pain, which  showed an EF of 30-35% but no ischemia - low risk. He was also noted to have sinus bradycardia with HR in the 40's. He was re-evaluated by Dr. Lovena Le, who decided to implant a CRT PPM given ICM and symptomatic bradycardia. This was performed 01/31/15 by [name]. Presented back a week later and noted to have atrial lead dislodgement and underwent successful RA lead repositioning with advancement of LV lead.   Earlier this year he was seen in clinic 3/31 and again on 4/4 and noted to be hypotensive with decreased PO intake. His metoprolol, losartan and doxazosin were held to allow for increase in BP. Blood pressure improved, and he was started back on losartan 12.5mg  daily, and asked to take lasix based on his weights. Dry weight was  reported at 185lbs. Saw Dr. Lovena Le in the office on 9/19. At this visit, it was noted that attempts to get him back into NSR have been unsuccessful. His amiodarone was stopped, also stopped his Eliquis as he was only taking this intermittently. He was placed back on Plavix. His device was reprogrammed at that time.   Tells me over the past 2 months he has had worsening dyspnea, slightly productive cough, and lower extremity edema. Has been complaint with medications at home. Weighs himself, but seems to have been losing weight. Lack of appetite. Also has been taking his losartan, and reports this drops his blood pressure into the 0000000 systolic and makes him feel terrible. Reports his "lungs hurt". Also been having some GI discomfort, and reports his breathing is better after he burps.   Presented to the ED today reporting these symptoms and worsening orthopnea. States he normally lays on his side at night and has been unable to do so recently. Labs in the ED showed Na+ 130, BNP 294, trop neg x2, Hgb 13.3. CXR with no acute edema or infection noted. EKG with noted P waves, appears to be atrial flutter with v pacing, controlled. Was given 40mg  IV lasix in the ED with 1L UOP thus far.    Home Medications    Prior to Admission medications   Medication Sig Start Date End Date Taking? Authorizing Provider  acetaminophen (TYLENOL) 500 MG tablet Take 500-1,000 mg by mouth every 6 (six) hours as needed (pain).     Historical Provider, MD  amoxicillin (AMOXIL) 500 MG capsule Take 2,000 mg by mouth See admin instructions. Take 4 capsules (2000 mg) by mouth one hour prior to dental appointments    Historical Provider, MD  bicalutamide (CASODEX) 50 MG tablet Take 50 mg by mouth daily.  12/29/13   Historical Provider, MD  brimonidine (ALPHAGAN) 0.15 % ophthalmic solution Place 1 drop into both eyes 2 (two) times daily. 08/11/15   Historical Provider, MD  clopidogrel (PLAVIX) 75 MG tablet Take 75 mg by mouth daily.    Historical Provider, MD  docusate sodium (COLACE) 100 MG capsule Take 100 mg by mouth daily.    Historical Provider, MD  dorzolamide-timolol (COSOPT) 22.3-6.8 MG/ML ophthalmic solution Place 1 drop into both eyes 2 (two) times daily.  11/12/13   Historical Provider, MD  finasteride (PROSCAR) 5 MG tablet Take 5 mg by mouth daily. 12/08/13   Historical Provider, MD  fluticasone (FLONASE) 50 MCG/ACT nasal spray Place 2 sprays into both nostrils daily as needed for allergies.  12/08/13   Historical Provider, MD  furosemide (LASIX) 20 MG tablet Take 20 mg by mouth every other day.    Historical Provider, MD  LORazepam (ATIVAN) 0.5 MG tablet Take 0.5 mg by mouth every 8 (eight) hours as needed for anxiety.     Historical Provider, MD  losartan (COZAAR) 25 MG tablet Take 0.5 tablets (12.5 mg total) by mouth daily. Patient not taking: Reported on 11/15/2015 07/15/15   Jettie Booze, MD  Multiple Vitamin (MULTIVITAMIN WITH MINERALS) TABS tablet Take 1 tablet by mouth daily. Centrum Silver    Historical Provider, MD  nitroGLYCERIN (NITROSTAT) 0.4 MG SL tablet Place 1 tablet (0.4 mg total) under the tongue every 5 (five) minutes as needed for chest pain. Patient not taking: Reported  on 11/15/2015 09/21/13   Liliane Shi, PA-C  Omega 3-Lutein-Zeaxanthin (ADVANCED EYE HEALTH PO) Take 1 tablet by mouth daily with lunch.     Historical Provider, MD  ondansetron (ZOFRAN) 4 MG tablet Take 1 tablet by mouth every 6 (six) hours as needed for nausea.  01/12/15   Historical Provider, MD  ondansetron (ZOFRAN-ODT) 4 MG disintegrating tablet Take 1 tablet (4 mg total) by mouth every 8 (eight) hours as needed for nausea or vomiting. Patient not taking: Reported on 11/15/2015 11/02/15   Davonna Belling, MD  potassium chloride (K-DUR) 10 MEQ tablet Take 10 mEq by mouth every other day.    Historical Provider, MD  Probiotic Product (PROBIOTIC PO) Take 1 tablet by mouth 2 (two) times daily.     Historical Provider, MD  psyllium (METAMUCIL) 58.6 % packet Take 1 packet by mouth daily as needed (for constipation).     Historical Provider, MD  tamsulosin (FLOMAX) 0.4 MG CAPS capsule Take 0.4 mg by mouth daily after supper.    Historical Provider, MD  traMADol (ULTRAM) 50 MG tablet Take 50 mg by mouth daily.  12/23/14   Historical Provider, MD    Family History    Family History  Problem Relation Age of Onset  . Heart disease Mother   . Heart attack Mother   . Emphysema Father   . Cancer Brother     liver cancer  . Cancer      Social History    Social History   Social History  . Marital status: Widowed    Spouse name: N/A  . Number of children: N/A  . Years of education: N/A   Occupational History  . Not on file.   Social History Main Topics  . Smoking status: Former Smoker    Packs/day: 1.00    Years: 14.00    Types: Cigarettes    Quit date: 01/05/1959  . Smokeless tobacco: Never Used  . Alcohol use 0.0 oz/week     Comment: 02/09/2014 "I'm not drinking anymore"  . Drug use: No  . Sexual activity: No   Other Topics Concern  . Not on file   Social History Narrative  . No narrative on file     Review of Systems    General:  No chills, fever, night sweats or weight  changes.  Cardiovascular: See HPI Dermatological: No rash, lesions/masses Respiratory: No cough, dyspnea Urologic: No hematuria, dysuria Abdominal:   No nausea, vomiting, diarrhea, bright red blood per rectum, melena, or hematemesis Neurologic:  No visual changes, wkns, changes in mental status. All other systems reviewed and are otherwise negative except as noted above.  Physical Exam    Blood pressure 138/89, pulse 75, temperature 97.8 F (36.6 C), temperature source Oral, resp. rate 16, SpO2 99 %.  General: Pleasant older WM, NAD Psych: Normal affect. Neuro: Alert and oriented X 3. Moves all extremities spontaneously. HEENT: Normal  Neck: Supple without bruits or JVD. Lungs:  Resp regular and unlabored, Faint crackles to LLL. Heart: Irreg Irreg no s3, s4, or murmurs. Abdomen: Soft, non-tender, non-distended, BS + x 4.  Extremities: No clubbing, cyanosis, 1+ bilateral LE edema. DP/PT/Radials 2+ and equal bilaterally.  Labs    Troponin [facility] of Care Test)  Recent Labs  01/06/16 1514  TROPIPOC 0.00   No results for input(s): CKTOTAL, CKMB, TROPONINI in the last 72 hours. Lab Results  Component Value Date   WBC 6.4 01/06/2016   HGB 13.3 01/06/2016   HCT 38.7 (L) 01/06/2016   MCV 92.8 01/06/2016   PLT 171 01/06/2016    Recent Labs Lab 01/06/16 1502  NA 130*  K 4.5  CL 99*  CO2 23  BUN 15  CREATININE 1.10  CALCIUM 9.1  PROT 5.8*  BILITOT 0.7  ALKPHOS 90  ALT 16*  AST 20  GLUCOSE 115*   No results found for: CHOL, HDL, LDLCALC, TRIG No results found for: DDIMER   Radiology Studies    Dg Chest 2 View  Result Date: 01/06/2016 CLINICAL DATA:  82 year old male with a history of productive cough and swollen ankles EXAM: CHEST  2 VIEW COMPARISON:  11/02/2015, 05/19/2015, CT abdomen 11/02/2015 FINDINGS: Cardiomediastinal silhouette unchanged in size and contour. Unchanged cardiac pacing device on the left chest wall. Stenting of coronary arteries.  Calcifications of the aortic arch. No confluent airspace disease.  No pneumothorax or pleural effusion. Linear opacities of the base of the right lung unchanged from prior with associated asymmetric elevation of the right hemidiaphragm. Degenerative changes of the thoracic spine.  No displaced fracture. IMPRESSION: Chronic lung changes without evidence of superimposed acute cardiopulmonary disease. Aortic atherosclerosis. Signed, Dulcy Fanny. Earleen Newport, DO [facility] Radiology Electronically Signed   By: Corrie Mckusick D.O.   On: 01/06/2016 16:12    ECG & Cardiac Imaging    EKG: Atrial Flutter with V pacing  Echo: 11/16  Study Conclusions  - Left ventricle: Diffuse hypokinesis abnormal septal motion The   cavity size was moderately dilated. Wall thickness was increased   in a pattern of moderate LVH. Systolic function was moderately to   severely reduced. The estimated ejection fraction was in the   range of 30% to 35%. Doppler parameters are consistent with   abnormal left ventricular relaxation (grade 1 diastolic   dysfunction). - Mitral valve: Anteriorly directed MR with restricted posterior   leaflet There was mild to moderate regurgitation. - Left atrium: The atrium was mildly dilated. - Atrial septum: No defect or patent foramen ovale was identified.  Assessment & Plan    82 yo male with PMH of permanent atrial fib , bradycardia, LBBB, past GI bleed, and CAD who presented with c/o increased dyspnea and orthopnea over the past 2 months.   1. Acute on Chronic combined HF: Does appear to be slightly volume overloaded on PE, and mildly elevated BNP. Received one dose of IV lasix in the ED with 1L UOP.  -- Will plan for additional dose of IV lasix tomorrow morning. Monitor BMET -- Also hold his losartan, as this does not seem to have any additional benefit, and by report is dropping his blood pressure.  -- daily weights/I&Os.   2.  Permanent  AF: Appears to be in atrial flutter, v paced. Rates controlled. Was taken off Eliquis and placed on Plavix due to past GI bleeding by Dr. Lovena Le at his last office visit. BB was stopped earlier this year due to hypotension.   3. S/p St Jude PPM: Device was reprogrammed at last office visit.   4. CAD: Last cath 7/15 with rotational arterectomy with DES to LAD. Does not report any anginal symptoms.  -- On Plavix   Signed, Reino Bellis, NP-C Pager [phone] 01/06/2016, 5:25 PM  History and all data above reviewed.  Patient examined.  I agree with the findings as above. He presents predominantly with weakness and also has cough with white sputum production.  There has been some shoulder discomfort but he reports that this was secondary to rain.    The patient exam reveals COR:RRR  ,  Lungs: Decreased breath sounds no crackles  ,  Abd: Positive bowel sounds, no rebound no guarding, Ext mild  right greater than left edema  .  All available labs, radiology testing, previous records reviewed. Agree with documented assessment and plan. He predominantly presents with weakness.  However, he has some increased dyspnea.  There are no high risk findings on his initial labs.  He was given IV Lasix in the ED and I would like to observe him and give another dose in the AM.  I will stop the Cozaar as he gets hypotensive with this.  I suspect he will go home tomorrow.    Jeneen Rinks Siriah Treat  6:18 PM  01/06/2016

## 2016-01-06 NOTE — ED Notes (Signed)
Cardiology NP at bedside.

## 2016-01-06 NOTE — ED Notes (Signed)
Attempted report 

## 2016-01-06 NOTE — ED Triage Notes (Signed)
Pt with increasing sternal chest pain today accompanied by increasing sob and weakness.  States some LE edema as well.

## 2016-01-07 ENCOUNTER — Encounter (HOSPITAL_COMMUNITY): Payer: Self-pay | Admitting: Cardiology

## 2016-01-07 DIAGNOSIS — I5043 Acute on chronic combined systolic (congestive) and diastolic (congestive) heart failure: Secondary | ICD-10-CM | POA: Diagnosis not present

## 2016-01-07 DIAGNOSIS — I4821 Permanent atrial fibrillation: Secondary | ICD-10-CM

## 2016-01-07 DIAGNOSIS — I482 Chronic atrial fibrillation: Secondary | ICD-10-CM

## 2016-01-07 DIAGNOSIS — Z9114 Patient's other noncompliance with medication regimen: Secondary | ICD-10-CM

## 2016-01-07 HISTORY — DX: Permanent atrial fibrillation: I48.21

## 2016-01-07 LAB — BASIC METABOLIC PANEL
ANION GAP: 9 (ref 5–15)
BUN: 8 mg/dL (ref 6–20)
CALCIUM: 9.2 mg/dL (ref 8.9–10.3)
CO2: 26 mmol/L (ref 22–32)
CREATININE: 1.09 mg/dL (ref 0.61–1.24)
Chloride: 100 mmol/L — ABNORMAL LOW (ref 101–111)
GFR, EST NON AFRICAN AMERICAN: 59 mL/min — AB (ref >60)
Glucose, Bld: 102 mg/dL — ABNORMAL HIGH (ref 65–99)
Potassium: 3.8 mmol/L (ref 3.5–5.1)
SODIUM: 135 mmol/L (ref 135–145)

## 2016-01-07 MED ORDER — CLOPIDOGREL BISULFATE 75 MG PO TABS: 75.0000 mg | ORAL_TABLET | Freq: Every day | ORAL | 6 refills | Status: DC

## 2016-01-07 MED ORDER — TAMSULOSIN HCL 0.4 MG PO CAPS: 0.4000 mg | ORAL_CAPSULE | Freq: Every day | ORAL | 6 refills | Status: DC

## 2016-01-07 MED ORDER — FUROSEMIDE 10 MG/ML IJ SOLN
40.0000 mg | Freq: Once | INTRAMUSCULAR | Status: AC
  Administered 2016-01-07: 40 mg via INTRAVENOUS
  Filled 2016-01-07: qty 4

## 2016-01-07 MED ORDER — DIAZEPAM 5 MG PO TABS: 5.0000 mg | ORAL_TABLET | Freq: Two times a day (BID) | ORAL | 0 refills | Status: DC | PRN

## 2016-01-07 MED ORDER — FUROSEMIDE 20 MG PO TABS: 20.0000 mg | ORAL_TABLET | Freq: Every day | ORAL | Status: DC

## 2016-01-07 MED ORDER — FUROSEMIDE 20 MG PO TABS: 20.0000 mg | ORAL_TABLET | Freq: Every day | ORAL | 6 refills | Status: DC

## 2016-01-07 MED ORDER — FINASTERIDE 5 MG PO TABS: 5.0000 mg | ORAL_TABLET | Freq: Every day | ORAL | 6 refills | Status: DC

## 2016-01-07 MED ORDER — DIAZEPAM 5 MG PO TABS
5.0000 mg | ORAL_TABLET | Freq: Two times a day (BID) | ORAL | Status: DC | PRN
  Administered 2016-01-07: 5 mg via ORAL
  Filled 2016-01-07: qty 1

## 2016-01-07 NOTE — Discharge Summary (Signed)
Discharge Summary    Patient ID: [name],  MRN: [id number], DOB/AGE: Oct 17, 1926 82 y.o.  Admit date: 01/06/2016 Discharge date: 01/07/2016  Primary Care Provider: [id number] Primary Cardiologist: Dr. Irish Lack   Discharge Diagnoses    Principal Problem:   Acute on chronic combined systolic and diastolic ACC/AHA stage C congestive heart failure (HCC) Active Problems:   Essential hypertension, benign   Coronary atherosclerosis of native coronary artery   Chronic systolic heart failure (HCC)   History of GI bleed   Atrial fibrillation, permanent (HCC)   Allergies Allergies  Allergen Reactions  . Protonix [Pantoprazole Sodium] Other (See Comments)    Gi upset, insomnia  . Codeine Anxiety and Other (See Comments)    Insomnia/ hyper  . Prilosec [Omeprazole] Nausea And Vomiting and Other (See Comments)    GI upset, insomnia   . Warfarin Sodium Anxiety and Other (See Comments)    Hx gi bleed     Diagnostic Studies/Procedures    none _____________   History of Present Illness     82 yo male with PMH of permanet AF, bradycardia s/p PPM, LBBB, past GI bleed, and CAD. He had a complex PCI of his LAD in July 2015 involving rotational atherectomy and DES. Prior to the intervention, his ejection fraction was in the 30-35% range.Echocardiogram about a month after the intervention showed an improvement in his EF from 50-55%. He has not been on chronic anticoagulation due to prior history of GI bleeding. His most recent ischemic eval was a NST performed in April 2016 for atypical chest pain, which showed an EF of 30-35% but no ischemia - low risk. He was also noted to have sinus bradycardia with HR in the 40's. He was re-evaluated by Dr. Lovena Le, who decided to implant a CRT PPM given ICM and symptomatic bradycardia. This was performed 01/31/15 by [name]. Presented back a week later and noted to have atrial lead dislodgement and underwent successful RA lead repositioning  with advancement of LV lead.   On admit 01/06/16 he related to Dr. Percival Spanish that over the past 2 months he has had worsening dyspnea, slightly productive cough, and lower extremity edema. Has been complaint with medications at home. Weighs himself, but seems to have been losing weight. Lack of appetite. Also has been taking his losartan, and reports this drops his blood pressure into the 0000000 systolic and makes him feel terrible. Reports his "lungs hurt". Also been having some GI discomfort, and reports his breathing is better after he burps.   On arrival to ER his symptoms were worsening along with orthopnea. Stated he normally lays on his side at night and has been unable to do so recently. Labs in the ED showed Na+ 130, BNP 294, trop neg x2, Hgb 13.3. CXR with no acute edema or infection noted. EKG with noted P waves, appears to be atrial flutter with v pacing, controlled. Was given 40mg  IV lasix in the ED with 1L UOP.  He was admitted for further diuresing.    Hospital Course     Consultants: none   Pt admitted and by this AM he is neg. 1930 ml.  His wt is down from 180.9 lbs to 178.9.    Cr 1.09 and K+ 3.8. He has been seen and found stable for discharge by Dr. Meda Coffee.   He admitted to have stopped his meds.  We will discharge on lasix 20 daily and hold losartan. We will follow up in the next 3-4  weeks.  He continues in his permanent a fib. With V pacing.  Was taken off Eliquis and placed on Plavix due to past GI bleeding by Dr. Lovena Le at his last office visit. BB was stopped earlier this year due to hypotension.  Stable PPM. And no chest pain.  _____________  Discharge Vitals Blood pressure 116/74, pulse 73, temperature 97.8 F (36.6 C), temperature source Oral, resp. rate 18, height 5\' 8"  (1.727 m), weight 178 lb 9.6 oz (81 kg), SpO2 98 %.  Filed Weights   01/06/16 2036 01/07/16 0439  Weight: 180 lb 9.6 oz (81.9 kg) 178 lb 9.6 oz (81 kg)    Labs & Radiologic Studies    CBC  Recent  Labs  01/06/16 1502  WBC 6.4  HGB 13.3  HCT 38.7*  MCV 92.8  PLT XX123456   Basic Metabolic Panel  Recent Labs  01/06/16 1502 01/07/16 0523  NA 130* 135  K 4.5 3.8  CL 99* 100*  CO2 23 26  GLUCOSE 115* 102*  BUN 15 8  CREATININE 1.10 1.09  CALCIUM 9.1 9.2   Liver Function Tests  Recent Labs  01/06/16 1502  AST 20  ALT 16*  ALKPHOS 90  BILITOT 0.7  PROT 5.8*  ALBUMIN 3.9   No results for input(s): LIPASE, AMYLASE in the last 72 hours. Cardiac Enzymes No results for input(s): CKTOTAL, CKMB, CKMBINDEX, TROPONINI in the last 72 hours. BNP Invalid input(s): POCBNP D-Dimer No results for input(s): DDIMER in the last 72 hours. Hemoglobin A1C No results for input(s): HGBA1C in the last 72 hours. Fasting Lipid Panel No results for input(s): CHOL, HDL, LDLCALC, TRIG, CHOLHDL, LDLDIRECT in the last 72 hours. Thyroid Function Tests No results for input(s): TSH, T4TOTAL, T3FREE, THYROIDAB in the last 72 hours.  Invalid input(s): FREET3 _____________  Dg Chest 2 View  Result Date: 01/06/2016 CLINICAL DATA:  82 year old male with a history of productive cough and swollen ankles EXAM: CHEST  2 VIEW COMPARISON:  11/02/2015, 05/19/2015, CT abdomen 11/02/2015 FINDINGS: Cardiomediastinal silhouette unchanged in size and contour. Unchanged cardiac pacing device on the left chest wall. Stenting of coronary arteries. Calcifications of the aortic arch. No confluent airspace disease.  No pneumothorax or pleural effusion. Linear opacities of the base of the right lung unchanged from prior with associated asymmetric elevation of the right hemidiaphragm. Degenerative changes of the thoracic spine.  No displaced fracture. IMPRESSION: Chronic lung changes without evidence of superimposed acute cardiopulmonary disease. Aortic atherosclerosis. Signed, Dulcy Fanny. Earleen Newport, DO [facility] Radiology Electronically Signed   By: Corrie Mckusick D.O.   On:  01/06/2016 16:12   Disposition   Pt is being discharged home today in good condition.  Follow-up Plans & Appointments   Heart Healthy low salt diet  Weigh daily Call [phone] if weight climbs more than 3 pounds in a day or 5 pounds in a week. No salt to very little salt in your diet.  No more than 2000 mg in a day. Call if increased shortness of breath or increased swelling.  Our office will call you for a follow up appt.      Discharge Medications   Current Discharge Medication List    START taking these medications   Details  diazepam (VALIUM) 5 MG tablet Take 1 tablet (5 mg total) by mouth every 12 (twelve) hours as needed for anxiety. Qty: 5 tablet, Refills: 0      CONTINUE these medications which have CHANGED   Details  clopidogrel (PLAVIX) 75 MG tablet Take 1 tablet (75 mg total) by mouth daily. Qty: 30 tablet, Refills: 6    finasteride (PROSCAR) 5 MG tablet Take 1 tablet (5 mg total) by mouth daily. Qty: 30 tablet, Refills: 6    furosemide (LASIX) 20 MG tablet Take 1 tablet (20 mg total) by mouth daily. Qty: 30 tablet, Refills: 6   Associated Diagnoses: Chronic systolic heart failure (HCC)    tamsulosin (FLOMAX) 0.4 MG CAPS capsule Take 1 capsule (0.4 mg total) by mouth daily after supper. Qty: 30 capsule, Refills: 6      CONTINUE these medications which have NOT CHANGED   Details  acetaminophen (TYLENOL) 500 MG tablet Take 1,000 mg by mouth every 6 (six) hours as needed for headache (pain).     brimonidine (ALPHAGAN) 0.15 % ophthalmic solution Place 1 drop into both eyes 2 (two) times daily.    dorzolamide-timolol (COSOPT) 22.3-6.8 MG/ML ophthalmic solution Place 1 drop into both eyes 2 (two) times daily.  Refills: 2    Flaxseed, Linseed, (FLAX SEEDS PO) Take 15 mLs by mouth daily. Grind up seeds and take with honey    fluticasone (FLONASE) 50 MCG/ACT nasal spray Place 2 sprays into both nostrils daily as needed for allergies.  Refills: 1      Multiple Vitamins-Minerals (EMERGEN-C VITAMIN C) PACK Take 1,000 mg by mouth daily after lunch.    Multiple Vitamins-Minerals (PRESERVISION AREDS 2) CAPS Take 1 capsule by mouth daily after lunch.    nitroGLYCERIN (NITROSTAT) 0.4 MG SL tablet Place 1 tablet (0.4 mg total) under the tongue every 5 (five) minutes as needed for chest pain. Qty: 20 tablet, Refills: 0   Associated Diagnoses: Coronary artery disease involving native coronary artery of native heart with other form of angina pectoris (HCC)    Omega-3 Fatty Acids (FISH OIL PO) Take 1 capsule by mouth daily after lunch.    polyethylene glycol (MIRALAX / GLYCOLAX) packet Take 17 g by mouth daily as needed (constipation). Mix in 8 oz liquid and drink    potassium chloride (K-DUR) 10 MEQ tablet Take 5 mEq by mouth daily.     Probiotic Product (PROBIOTIC PO) Take 1 tablet by mouth 2 (two) times daily.     psyllium (METAMUCIL) 58.6 % packet Take 1 packet by mouth daily with supper. Mix in 8 oz liquid and drink      STOP taking these medications     amoxicillin (AMOXIL) 500 MG capsule      LORazepam (ATIVAN) 0.5 MG tablet      losartan (COZAAR) 25 MG tablet      ondansetron (ZOFRAN-ODT) 4 MG disintegrating tablet      traMADol (ULTRAM) 50 MG tablet           Outstanding Labs/Studies   none  Duration of Discharge Encounter   Greater than 30 minutes including physician time.  Signed, Cecilie Kicks NP 01/07/2016, 11:43 AM

## 2016-01-07 NOTE — Discharge Summary (Signed)
Pt got discharged to home, discharge instructions provided and patient showed understanding to it, IV taken out,Telemonitor DC,pt left unit in wheelchair with all of the belongings accompanied with a family member (caregiver)

## 2016-01-07 NOTE — Discharge Instructions (Signed)
Heart Healthy low salt diet  Weigh daily Call [phone] if weight climbs more than 3 pounds in a day or 5 pounds in a week. No salt to very little salt in your diet.  No more than 2000 mg in a day. Call if increased shortness of breath or increased swelling.  Our office will call you for a follow up appt.

## 2016-01-07 NOTE — Progress Notes (Signed)
Patient Name: [name] Date of Encounter: 01/07/2016  Active Problems:   Acute on chronic combined systolic and diastolic ACC/AHA stage C congestive heart failure ([place])   Length of Stay: 0  SUBJECTIVE  The patient is feeling significantly better today.   CURRENT MEDS . brimonidine  1 drop Both Eyes BID  . clopidogrel  75 mg Oral Daily  . dorzolamide-timolol  1 drop Both Eyes BID  . finasteride  5 mg Oral Daily  . sodium chloride flush  3 mL Intravenous Q12H  . tamsulosin  0.4 mg Oral QPC supper   OBJECTIVE  Vitals:   01/06/16 1932 01/06/16 2036 01/07/16 0003 01/07/16 0439  BP: 122/76 121/77 109/63 116/74  Pulse: 77 75 75 73  Resp: 16 18 18 18   Temp:  97.4 F (36.3 C) 98.5 F (36.9 C) 97.8 F (36.6 C)  TempSrc:  Oral Oral Oral  SpO2: 96% 97% 97% 98%  Weight:  180 lb 9.6 oz (81.9 kg)  178 lb 9.6 oz (81 kg)  Height:  5\' 8"  (1.727 m)      Intake/Output Summary (Last 24 hours) at 01/07/16 1030 Last data filed at 01/07/16 0938  Gross per 24 hour  Intake              120 ml  Output             2050 ml  Net            -1930 ml   Filed Weights   01/06/16 2036 01/07/16 0439  Weight: 180 lb 9.6 oz (81.9 kg) 178 lb 9.6 oz (81 kg)   PHYSICAL EXAM  General: Pleasant, NAD. Neuro: Alert and oriented X 3. Moves all extremities spontaneously. Psych: Normal affect. HEENT:  Normal  Neck: Supple without bruits or JVD. Lungs:  Resp regular and unlabored, CTA. Heart: RRR no s3, s4, or murmurs. Abdomen: Soft, non-tender, non-distended, BS + x 4.  Extremities: No clubbing, cyanosis or edema. DP/PT/Radials 2+ and equal bilaterally.  Accessory Clinical Findings  CBC  Recent Labs  01/06/16 1502  WBC 6.4  HGB 13.3  HCT 38.7*  MCV 92.8  PLT [id number]   Basic Metabolic Panel  Recent Labs  01/06/16 1502 01/07/16 0523  NA 130* 135  K 4.5 3.8  CL 99* 100*  CO2 23 26  GLUCOSE 115* 102*  BUN 15 8  CREATININE 1.10 1.09  CALCIUM 9.1 9.2   Liver Function  Tests  Recent Labs  01/06/16 1502  AST 20  ALT 16*  ALKPHOS 90  BILITOT 0.7  PROT 5.8*  ALBUMIN 3.9   Radiology/Studies  Dg Chest 2 View  Result Date: 01/06/2016 CLINICAL DATA:  82 year old male with a history of productive cough and swollen ankles EXAM: CHEST  2 VIEW COMPARISON:  11/02/2015, 05/19/2015, CT abdomen 11/02/2015 FINDINGS: Cardiomediastinal silhouette unchanged in size and contour. Unchanged cardiac pacing device on the left chest wall. Stenting of coronary arteries. Calcifications of the aortic arch. No confluent airspace disease.  No pneumothorax or pleural effusion. Linear opacities of the base of the right lung unchanged from prior with associated asymmetric elevation of the right hemidiaphragm. Degenerative changes of the thoracic spine.  No displaced fracture. IMPRESSION: Chronic lung changes without evidence of superimposed acute cardiopulmonary disease. Aortic atherosclerosis. Signed, Dulcy Fanny. Earleen Newport, DO [facility] Radiology Electronically Signed   By: Corrie Mckusick D.O.   On: 01/06/2016 16:12   TELE: a-fib with ventricular rate in 70',  this am 113.     ASSESSMENT AND PLAN  82 yo male with PMH of permanet atrial fib , bradycardia, LBBB, past GI bleed, and CAD who presented with c/o increased dyspnea and orthopnea over the past 2 months.   1. Acute on Chronic combined HF: he admits that he stopped taking all meds, he responded really well to one dose of iv lasix, he received another one today, he is now euvolemic, we will restart his home dose of lasix 20 mg po daily and hold losartan. Crea 1.0. He should be followed in the clinic with the next 3-4 weeks.   2.  Permanent AF: Appears to be in atrial flutter, v paced. Rates controlled. Was taken off Eliquis and placed on Plavix due to past GI bleeding by Dr. Lovena Le at his last office visit. BB was stopped earlier this year due to hypotension.   3. S/p St [employer] PPM:  Device was reprogrammed at last office visit.   4. CAD: Last cath 7/15 with rotational arterectomy with DES to LAD. Does not report any anginal symptoms.  -- On Plavix  Signed, Ena Dawley MD, [facility] 01/07/2016

## 2016-01-09 DIAGNOSIS — D329 Benign neoplasm of meninges, unspecified: Secondary | ICD-10-CM | POA: Diagnosis not present

## 2016-01-09 DIAGNOSIS — F419 Anxiety disorder, unspecified: Secondary | ICD-10-CM | POA: Diagnosis not present

## 2016-01-09 DIAGNOSIS — I5020 Unspecified systolic (congestive) heart failure: Secondary | ICD-10-CM | POA: Diagnosis not present

## 2016-01-09 DIAGNOSIS — N400 Enlarged prostate without lower urinary tract symptoms: Secondary | ICD-10-CM | POA: Diagnosis not present

## 2016-01-09 DIAGNOSIS — M519 Unspecified thoracic, thoracolumbar and lumbosacral intervertebral disc disorder: Secondary | ICD-10-CM | POA: Diagnosis not present

## 2016-01-09 NOTE — Progress Notes (Signed)
Cardiology Office Note   Date:  01/10/2016   ID:  [name], DOB 04-13-1926, MRN [id number]  PCP:  Nathan Low, MD    No chief complaint on file.    Wt Readings from Last 3 Encounters:  01/10/16 83.2 kg (183 lb 6.4 oz)  01/07/16 81 kg (178 lb 9.6 oz)  11/15/15 82.1 kg (181 lb)       History of Present Illness: [name] is a 82 y.o. male  with prior AFib, CAD and mitral regurgitation.  He most recently had a complex PCI of his LAD in July 2015 involving rotational atherectomy. He had a hematoma in his right wrist at the time of the diagnostic cath. He had a right groin hematoma after the intervention. Prior to the intervention, his ejection fraction was in the 30-35% range. Echocardiogram about a month after the intervention showed an improvement in his EF from 50-55%. He continued to have some fatigue post procedure. He has had issues with his prostate and difficulty sleeping.  He then developed tachybrady syndrome and had a BiV - pacer placed.  He was found to have AFib.  He had GI bleeding with Warfarin.  He did not tolerate Eliquis.  Amio was started to maintain NSR, but he reverted to AFib even after DCCV.  Most recently, in 9/17, Amio and Warfarin were stopped and Plavix was restarted since he had taken this without bleeding issues in the past.  He is sedentary ancd continues to have dyspnea.  Lasix is taken regularly to help with fluid status.  He was hospitalized on 01/06/16 with sx of fluid overload.  He diuresed well.  He was discharged the next day, on 01/07/16.  He feels better since getting out of the hospital.  BP has been too [name] to use any ACE-I or beta blocker for his LV dysfunction.     Past Medical History:  Diagnosis Date  . Anxiety   . Arthritis   . Atrial fibrillation, permanent ([place]) 01/07/2016  . Blood transfusion    "related to a surgery"  . BPH (benign prostatic hypertrophy) with urinary obstruction    s/p turp yrs ago  .  Bradycardia    a. Amio d/c'd 08/2013; brady arrest 08/2013 after PCI >>> recurrent AF >>> Amiodarone restarted. b. Pacemaker being considered in 11/2014.  [name] CAD (coronary artery disease)    a. s/p MI and prior PCI of LAD;  b. LHC (08/2013):  prox LAD 60-70%, mid LAD stents ok, ostial lesion at Dx jailed by stent, mild CFX and RCA disesase >>>  PCI (09/08/13):  rotational atherectomy + Promus DES to prox LAD  . Cardiomyopathy ([place])    a. Echo (08/2013):  EF 30-35%, AS hypokinesis, Gr 1 diast dysfn, mild MR, mild LAE >>> b. improved EF 50-55% by echo 8/15. c. EF down again by echo 12/2014 to 30-35% but 51% by nuc.  [name] Chronic lower back pain   . Chronic systolic CHF (congestive heart failure) ([place])   . CKD (chronic kidney disease), stage III    a. Per review of labs baseline Cr 1.1-1.3.  [name] DDD (degenerative disc disease)    chronic back pain  . Depression   . Diverticulitis of colon with bleeding    s/p sigmoid resection '88  . DJD (degenerative joint disease) hips and knees   s/p bilateral total replacements  . GERD (gastroesophageal reflux disease)    occ. take prevacid  . History of GI diverticular bleed april 2012  transfused blood and resolved without surgical intervention  . Hypertension   . Impaired hearing bilateral    only left hearing aid  . Impotence, organic    s/p penile prosthesis 1990's  . Incomplete bladder emptying   . LBBB (left bundle branch block)   . Meningioma ([place]) right -sided w/ right VI palsy   followed by dr Gaynell Face  . Mitral regurgitation    a. Mild-mod by echo 12/2014.  [name] Myocardial infarction 1980's- medical intervention   "so mild I didn't know I'd had it"  . PAF (paroxysmal atrial fibrillation) ([facility])    not on coumadin due to hx of GI bleed.  . Prostate cancer ([place]) 11/30/13   Gleason 8, volume 22.14 cc  . Sleep apnea    non-compliant cpap    Past Surgical History:  Procedure Laterality Date  . APPENDECTOMY    . CARDIAC CATHETERIZATION  2007    noncritical cad (results w/ chart)  . CARDIOVERSION N/A 10/13/2015   Procedure: CARDIOVERSION;  Surgeon: Josue Hector, MD;  Location: [facility];  Service: Cardiovascular;  Laterality: N/A;  . CATARACT EXTRACTION W/ INTRAOCULAR LENS  IMPLANT, BILATERAL Bilateral ~ 2000  . CHOLECYSTECTOMY    . CLOSED REDUCTION HIP DISLOCATION Right "several"  . CORONARY ANGIOPLASTY WITH STENT PLACEMENT  08-03-08   drug-eluting stent x2 distal and mid lad  . EP IMPLANTABLE DEVICE N/A 01/31/2015   Procedure: BiV Pacemaker Insertion CRT-P;  Surgeon: Evans Lance, MD;  Location: [facility];  Service: Cardiovascular;  Laterality: N/A;  . EP IMPLANTABLE DEVICE N/A 02/07/2015   Procedure: Lead Revision/Repair;  Surgeon: Deboraha Sprang, MD;  Location: [facility];  Service: Cardiovascular;  Laterality: N/A;  . ESOPHAGOGASTRODUODENOSCOPY N/A 02/11/2014   Procedure: ESOPHAGOGASTRODUODENOSCOPY (EGD);  Surgeon: Cleotis Nipper, MD;  Location[facility] ENDOSCOPY;  Service: Endoscopy;  Laterality: N/A;  . gamma knife radiation  2000   [facility] for meningioma, last eval 2013- no change  . INGUINAL HERNIA REPAIR Bilateral   . INNER EAR SURGERY Right yrs ago   "trying to get my hearing back  . LEFT HEART CATHETERIZATION WITH CORONARY ANGIOGRAM N/A 09/04/2013   Procedure: LEFT HEART CATHETERIZATION WITH CORONARY ANGIOGRAM;  Surgeon: Jettie Booze, MD;  Location[facility] C[facility];  Service: Cardiovascular;  Laterality: N/A;  . PENILE PROSTHESIS IMPLANT  1990's  . PERCUTANEOUS CORONARY ROTOBLATOR INTERVENTION (PCI-R) N/A 09/08/2013   Procedure: PERCUTANEOUS CORONARY ROTOBLATOR INTERVENTION (PCI-R);  Surgeon: Jettie Booze, MD;  Location: [facility] CATH LAB;  Service: Cardiovascular;  Laterality: N/A;  . PROSTATE BIOPSY  11/30/13   Gleason 8, vol 22.14 cc  . REVISION TOTAL KNEE ARTHROPLASTY Right   . SHOULDER OPEN ROTATOR CUFF REPAIR Left   . TOTAL HIP ARTHROPLASTY Right 03-25-08--  . TOTAL HIP ARTHROPLASTY Left 2005    . TOTAL HIP REVISION Right 3-4 times  . TOTAL KNEE ARTHROPLASTY Right 2004  . TOTAL KNEE ARTHROPLASTY Left 1997  . TRANSURETHRAL RESECTION OF PROSTATE  "years ago"  . TRANSURETHRAL RESECTION OF PROSTATE  01/08/2011   Procedure: TRANSURETHRAL RESECTION OF THE PROSTATE (TURP);  Surgeon: Franchot Gallo;  Location: [facility];  Service: Urology;  Laterality: N/A;  GYRUS      Current Outpatient Prescriptions  Medication Sig Dispense Refill  . acetaminophen (TYLENOL) 500 MG tablet Take 1,000 mg by mouth every 6 (six) hours as needed for headache (pain).     . brimonidine (ALPHAGAN) 0.15 % ophthalmic solution Place 1 drop into both eyes  2 (two) times daily.    . clopidogrel (PLAVIX) 75 MG tablet Take 1 tablet (75 mg total) by mouth daily. 30 tablet 6  . diazepam (VALIUM) 5 MG tablet Take 1 tablet (5 mg total) by mouth every 12 (twelve) hours as needed for anxiety. 5 tablet 0  . dorzolamide-timolol (COSOPT) 22.3-6.8 MG/ML ophthalmic solution Place 1 drop into both eyes 2 (two) times daily.   2  . Flaxseed, Linseed, (FLAX SEEDS PO) Take 15 mLs by mouth daily. Grind up seeds and take with honey    . fluticasone (FLONASE) 50 MCG/ACT nasal spray Place 2 sprays into both nostrils daily as needed for allergies.   1  . furosemide (LASIX) 20 MG tablet Take 40 mg by mouth every morning.    . Multiple Vitamins-Minerals (EMERGEN-C VITAMIN C) PACK Take 1,000 mg by mouth daily after lunch.    . Multiple Vitamins-Minerals (PRESERVISION AREDS 2) CAPS Take 1 capsule by mouth daily after lunch.    . nitroGLYCERIN (NITROSTAT) 0.4 MG SL tablet Place 1 tablet (0.4 mg total) under the tongue every 5 (five) minutes as needed for chest pain. 20 tablet 0  . Omega-3 Fatty Acids (FISH OIL PO) Take 1 capsule by mouth daily after lunch.    . potassium chloride (K-DUR) 10 MEQ tablet Take 5 mEq by mouth daily.     . Probiotic Product (PROBIOTIC PO) Take 1 tablet by mouth 2 (two) times daily.     . psyllium  (METAMUCIL) 58.6 % packet Take 1 packet by mouth daily with supper. Mix in 8 oz liquid and drink    . finasteride (PROSCAR) 5 MG tablet Take 1 tablet (5 mg total) by mouth daily. (Patient not taking: Reported on 01/10/2016) 30 tablet 6  . tamsulosin (FLOMAX) 0.4 MG CAPS capsule Take 1 capsule (0.4 mg total) by mouth daily after supper. (Patient not taking: Reported on 01/10/2016) 30 capsule 6   No current facility-administered medications for this visit.     Allergies:   Protonix [pantoprazole sodium]; Codeine; Prilosec [omeprazole]; and Warfarin sodium    Social History:  The patient  reports that he quit smoking about 57 years ago. His smoking use included Cigarettes. He has a 14.00 pack-year smoking history. He has never used smokeless tobacco. He reports that he drinks alcohol. He reports that he does not use drugs.   Family History:  The patient's family history includes Cancer in his brother; Emphysema in his father; Heart attack in his mother; Heart disease in his mother.    ROS:  Please see the history of present illness.   Otherwise, review of systems are positive for fatigue.   All other systems are reviewed and negative.    PHYSICAL EXAM: VS:  BP 100/66   Pulse 74   Ht 5' 7.5" (1.715 m)   Wt 83.2 kg (183 lb 6.4 oz)   BMI 28.30 kg/m  , BMI Body mass index is 28.3 kg/m. GEN: Well nourished, well developed, in no acute distress , frail HEENT: normal  Neck: no JVD, carotid bruits, or masses Cardiac: RRR; 2/6 systolic murmur, no rubs, or gallops,no edema  Respiratory:  clear to auscultation bilaterally, normal work of breathing GI: soft, nontender, nondistended, + BS MS: no deformity or atrophy  Skin: warm and dry, no rash Neuro:  Strength and sensation are intact Psych: euthymic mood, full affect   EKG:   The ekg ordered today demonstrates AFib, V-pacing   Recent Labs: 02/14/2015: TSH 2.244 01/06/2016: ALT 16; B  Natriuretic Peptide 294.1; Hemoglobin 13.3; Platelets  171 01/07/2016: BUN 8; Creatinine, Ser 1.09; Potassium 3.8; Sodium 135   Lipid Panel No results found for: CHOL, TRIG, HDL, CHOLHDL, VLDL, LDLCALC, LDLDIRECT   Other studies Reviewed: Additional studies/ records that were reviewed today with results demonstrating: 11/16 echo showed EF 30-35%, mild to moderate mitral regurgitation.   ASSESSMENT AND PLAN:  1. CAD: Continue the clopidogrel monotherapy given his CAD. He has a history of GI bleeding in the past on Warfarin. No aspirin at this time. He is not having angina. His ejection fraction was improved, but dropped again after AFib persisted. Continue medical therapy. Some of his fatigue/dyspnea may be related to age and deconditioning. Increase exercise slowly as tolerated but he is limited. 2.  AFib/flutter: Amiodarone was stopped. Persistent AFib.  Not a candidate for anticoagulation due to bleeding risk. Tolerating Plavix.  3.  Mitral regurgitation: No overt CHF now. Moderate in 12/2014.  He is somewhat frail and does not appear to be a good candidate for a surgical valve procedure.   4.  HTN: BP has been controlled.  Sometimes [name].  Unable to add BP lowering drugs for his chronic systolic heart failure.  Lasix 20 mg PO BID prn. He will take it once daily for sure.  This gives the option for a second dose. I counseled him to avoid salt.  He is going to get a digital scale and will take an extra Lasix 20 mg for weight gain > 2 lbs in a day.   BP has been too [name] to use any ACE-I or beta blocker for his LV dysfunction.   Current medicines are reviewed at length with the patient today.  The patient concerns regarding his medicines were addressed.  The following changes have been made:  No change  Labs/ tests ordered today include:  No orders of the defined types were placed in this encounter.   Recommend 150 minutes/week of aerobic exercise [name] fat, [name] carb, high fiber diet recommended  Disposition:   FU in 1  year   Signed, Larae Grooms, MD  01/10/2016 10:52 AM    [facility] [place], [place], [place]  [address] Phone: [phone]; Fax: [phone]

## 2016-01-10 ENCOUNTER — Encounter: Payer: Self-pay | Admitting: Interventional Cardiology

## 2016-01-10 ENCOUNTER — Ambulatory Visit (INDEPENDENT_AMBULATORY_CARE_PROVIDER_SITE_OTHER): Payer: Commercial Managed Care - HMO | Admitting: Interventional Cardiology

## 2016-01-10 VITALS — BP 100/66 | HR 74 | Ht 67.5 in | Wt 183.4 lb

## 2016-01-10 DIAGNOSIS — I10 Essential (primary) hypertension: Secondary | ICD-10-CM | POA: Diagnosis not present

## 2016-01-10 DIAGNOSIS — I5022 Chronic systolic (congestive) heart failure: Secondary | ICD-10-CM

## 2016-01-10 DIAGNOSIS — I482 Chronic atrial fibrillation: Secondary | ICD-10-CM | POA: Diagnosis not present

## 2016-01-10 DIAGNOSIS — I4821 Permanent atrial fibrillation: Secondary | ICD-10-CM

## 2016-01-10 DIAGNOSIS — I2510 Atherosclerotic heart disease of native coronary artery without angina pectoris: Secondary | ICD-10-CM

## 2016-01-10 DIAGNOSIS — I059 Rheumatic mitral valve disease, unspecified: Secondary | ICD-10-CM | POA: Diagnosis not present

## 2016-01-10 MED ORDER — FUROSEMIDE 20 MG PO TABS: 20.0000 mg | ORAL_TABLET | Freq: Two times a day (BID) | ORAL | 3 refills | Status: DC | PRN

## 2016-01-10 MED ORDER — FUROSEMIDE 20 MG PO TABS: ORAL_TABLET | ORAL | 3 refills | Status: DC

## 2016-01-10 NOTE — Patient Instructions (Signed)
Medication Instructions:  Take Furosemide 20 mg up to 2 times daily as needed for weight gain. All other medications remain the same.  Labwork: None  Testing/Procedures: None  Follow-Up: Your physician wants you to follow-up in: 1 year. You will receive a reminder letter in the mail two months in advance. If you don't receive a letter, please call our office to schedule the follow-up appointment.     If you need a refill on your cardiac medications before your next appointment, please call your pharmacy.

## 2016-01-23 DIAGNOSIS — H01024 Squamous blepharitis left upper eyelid: Secondary | ICD-10-CM | POA: Diagnosis not present

## 2016-01-23 DIAGNOSIS — H02834 Dermatochalasis of left upper eyelid: Secondary | ICD-10-CM | POA: Diagnosis not present

## 2016-01-23 DIAGNOSIS — H532 Diplopia: Secondary | ICD-10-CM | POA: Diagnosis not present

## 2016-01-23 DIAGNOSIS — H01022 Squamous blepharitis right lower eyelid: Secondary | ICD-10-CM | POA: Diagnosis not present

## 2016-01-23 DIAGNOSIS — H01021 Squamous blepharitis right upper eyelid: Secondary | ICD-10-CM | POA: Diagnosis not present

## 2016-01-23 DIAGNOSIS — H348322 Tributary (branch) retinal vein occlusion, left eye, stable: Secondary | ICD-10-CM | POA: Diagnosis not present

## 2016-01-23 DIAGNOSIS — H02831 Dermatochalasis of right upper eyelid: Secondary | ICD-10-CM | POA: Diagnosis not present

## 2016-01-23 DIAGNOSIS — Z961 Presence of intraocular lens: Secondary | ICD-10-CM | POA: Diagnosis not present

## 2016-01-23 DIAGNOSIS — H01025 Squamous blepharitis left lower eyelid: Secondary | ICD-10-CM | POA: Diagnosis not present

## 2016-01-24 ENCOUNTER — Telehealth (INDEPENDENT_AMBULATORY_CARE_PROVIDER_SITE_OTHER): Payer: Self-pay

## 2016-01-24 DIAGNOSIS — M6281 Muscle weakness (generalized): Secondary | ICD-10-CM | POA: Diagnosis not present

## 2016-01-24 DIAGNOSIS — M519 Unspecified thoracic, thoracolumbar and lumbosacral intervertebral disc disorder: Secondary | ICD-10-CM | POA: Diagnosis not present

## 2016-01-24 DIAGNOSIS — M545 Low back pain: Secondary | ICD-10-CM | POA: Diagnosis not present

## 2016-01-24 DIAGNOSIS — M79605 Pain in left leg: Secondary | ICD-10-CM | POA: Diagnosis not present

## 2016-01-24 NOTE — Telephone Encounter (Signed)
Had auth already on file for [phone] [id number] that is still good and needed auth for [phone]. Faxed auth form to [employer] today and got back. Josem Kaufmann [phone] for [phone]. Good from 02/01/16-07/30/16. Pt is already scheduled for 02/01/16/

## 2016-01-31 DIAGNOSIS — M6281 Muscle weakness (generalized): Secondary | ICD-10-CM | POA: Diagnosis not present

## 2016-01-31 DIAGNOSIS — M545 Low back pain: Secondary | ICD-10-CM | POA: Diagnosis not present

## 2016-01-31 DIAGNOSIS — M519 Unspecified thoracic, thoracolumbar and lumbosacral intervertebral disc disorder: Secondary | ICD-10-CM | POA: Diagnosis not present

## 2016-01-31 DIAGNOSIS — M79605 Pain in left leg: Secondary | ICD-10-CM | POA: Diagnosis not present

## 2016-02-01 ENCOUNTER — Ambulatory Visit (INDEPENDENT_AMBULATORY_CARE_PROVIDER_SITE_OTHER): Payer: Commercial Managed Care - HMO | Admitting: Physical Medicine and Rehabilitation

## 2016-02-01 ENCOUNTER — Encounter (INDEPENDENT_AMBULATORY_CARE_PROVIDER_SITE_OTHER): Payer: Self-pay | Admitting: Physical Medicine and Rehabilitation

## 2016-02-01 VITALS — BP 141/88 | HR 77

## 2016-02-01 DIAGNOSIS — M5416 Radiculopathy, lumbar region: Secondary | ICD-10-CM

## 2016-02-01 DIAGNOSIS — M419 Scoliosis, unspecified: Secondary | ICD-10-CM | POA: Diagnosis not present

## 2016-02-01 DIAGNOSIS — M961 Postlaminectomy syndrome, not elsewhere classified: Secondary | ICD-10-CM | POA: Diagnosis not present

## 2016-02-01 MED ORDER — LIDOCAINE HCL (PF) 1 % IJ SOLN
0.3300 mL | Freq: Once | INTRAMUSCULAR | Status: AC
  Administered 2016-02-01: 0.3 mL

## 2016-02-01 MED ORDER — METHYLPREDNISOLONE ACETATE 80 MG/ML IJ SUSP
80.0000 mg | Freq: Once | INTRAMUSCULAR | Status: AC
  Administered 2016-02-01: 80 mg

## 2016-02-01 NOTE — Patient Instructions (Signed)

## 2016-02-01 NOTE — Progress Notes (Signed)
[name] - 82 y.o. male MRN [id number]  Date of birth: Mar 01, 1926  Office Visit Note: Visit Date: 02/01/2016 PCP: Wenda Low, MD Referred by: Wenda Low, MD  Subjective: No chief complaint on file.  HPI: [name] is an 82 year old gentleman that I started seeing this past year. He's had a history of multilevel laminectomy and degenerative scoliosis.  He gets a lot of left more than right hip and leg pain consistent with an L4-L5 radicular pattern. In the past we are really unable to do epidural injection because of his use of anticoagulation. He has a significant heart issue. We tried facet joint injections which were fairly beneficial but not long-lasting. He does get a lot of back pain that I do feel like his facet mediated along with related to his posture and scoliosis. He tries to be active in doing a lot of activities and walking. He has a caregiver who provides a lot of alternative type care with massage and physical help with him which I think is doing well. He has not noted any new focal weakness is not noted any bowel or bladder changes. He has not had any recent falls. Left side lower back pain radiating down to knee. Worse 1st thing in the morning. Numbness into leg. This represents really a chronic pain syndrome and postlaminectomy syndrome type issue with severe pain and limiting which has not been relieved with medications including opioids. He is unable to take any anti-inflammatories. He is not a surgical candidate.  Take 1/2 Plavix every day but has not taken today    Review of Systems  Constitutional: Negative for chills, fever, malaise/fatigue and weight loss.  HENT: Negative for hearing loss and sinus pain.   Eyes: Negative for blurred vision, double vision and photophobia.  Respiratory: Negative for cough and shortness of breath.   Cardiovascular: Negative for chest pain, palpitations and leg swelling.  Gastrointestinal: Negative for abdominal pain, nausea  and vomiting.  Genitourinary: Negative for flank pain.  Musculoskeletal: Positive for back pain and joint pain. Negative for myalgias.  Skin: Negative for itching and rash.  Neurological: Positive for tingling. Negative for tremors, focal weakness and weakness.  Endo/Heme/Allergies: Negative.   Psychiatric/Behavioral: Negative for depression.  All other systems reviewed and are negative.  Otherwise per HPI.  Assessment & Plan: Visit Diagnoses:  1. Lumbar radiculopathy   2. Scoliosis of lumbar spine, unspecified scoliosis type   3. Post laminectomy syndrome     Plan: Findings:  Chronic worsening low back and in particular left radicular hip and leg pain and more of an L4-L5 distribution. Prior MRI findings show scoliosis and multilevel facet arthropathy with lateral recess stenosis but not much in the way of central canal stenosis. He's had multilevel decompression in the past he did fairly well with facet joint blocks diagnostically but this has not provided much relief with the left hip and leg pain. He was unable to come off his Plavix and we did have a long talk about the transforaminal epidural injection on anticoagulation. The current standard of care at this point is that transforaminal injections at least in the lumbar spine are likely okay to do from a risk to benefit standpoint with anticoagulation. The chances of interrupting epidural veins and causing a hematoma are fairly small the literature. We did discuss that with him and he did want to proceed with the injection. Plan today is for a left L5 transforaminal epidural steroid injection. He is going to be in [place]  for a few months and hopefully we'll get him some relief. He should continue with his physical therapy as well as current medications (injection will help. He has been going to [facility] physical therapy and is doing quite nicely without from a strength standpoint. I spent more than 20 minutes speaking face-to-face with the  patient with 50% of the time in counseling.    Meds & Orders:  Meds ordered this encounter  Medications  . lidocaine (PF) (XYLOCAINE) 1 % injection 0.3 mL  . methylPREDNISolone acetate (DEPO-MEDROL) injection 80 mg    Orders Placed This Encounter  Procedures  . Epidural Steroid injection    Follow-up: Return if symptoms worsen or fail to improve.   Procedures: No procedures performed  Lumbosacral Transforaminal Epidural Steroid Injection - Infraneural Approach with Fluoroscopic Guidance  Patient: [name]      Date of Birth: [birth year] MRN: [id number] PCP: Wenda Low, MD      Visit Date: 02/01/2016   Universal Protocol:    Date/Time: 12/07/176:05 AM  Consent Given By: the patient  Position: PRONE   Additional Comments: Vital signs were monitored before and after the procedure. Patient was prepped and draped in the usual sterile fashion. The correct patient, procedure, and site was verified.   Injection Procedure Details:  Procedure Site One Meds Administered:  Meds ordered this encounter  Medications  . lidocaine (PF) (XYLOCAINE) 1 % injection 0.3 mL  . methylPREDNISolone acetate (DEPO-MEDROL) injection 80 mg      Laterality: Left  Location/Site:  L5-S1  Needle size: 22 G  Needle type: Spinal  Needle Placement: Transforaminal  Findings:  -Contrast Used: 1 mL iohexol 180 mg iodine/mL   -Comments: Excellent flow of contrast along the nerve and into the epidural space. There was no vascular uptake.  Procedure Details: After squaring off the end-plates of the desired vertebral level to get a true AP view, the C-arm was obliqued to the painful side so that the superior articulating process is positioned about 1/3 the length of the inferior endplate.  The needle was aimed toward the junction of the superior articular process and the transverse process of the inferior vertebrae. The needle's initial entry is in the lower third of the foramen through  Kambin's triangle. The soft tissues overlying this target were infiltrated with 2-3 ml. of 1% Lidocaine without Epinephrine.  The spinal needle was then inserted and advanced toward the target using a "trajectory" view along the fluoroscope beam.  Under AP and lateral visualization, the needle was advanced so it did not puncture dura and did not traverse medially beyond the 6 o'clock position of the pedicle. Bi-planar projections were used to confirm position. Aspiration was confirmed to be negative for CSF and/or blood. A 1-2 ml. volume of Isovue-250 was injected and flow of contrast was noted at each level. Radiographs were obtained for documentation purposes.   After attaining the desired flow of contrast documented above, a 0.5 to 1.0 ml test dose of 0.25% Marcaine was injected into each respective transforaminal space.  The patient was observed for 90 seconds post injection.  After no sensory deficits were reported, and normal lower extremity motor function was noted,   the above injectate was administered so that equal amounts of the injectate were placed at each foramen (level) into the transforaminal epidural space.   Additional Comments:  The patient tolerated the procedure well No complications occurred Dressing: Band-Aid    Post-procedure details: Patient was observed during the procedure. Post-procedure instructions were reviewed.  Patient left the clinic in stable condition.     Clinical History: No specialty comments available.  He reports that he quit smoking about 57 years ago. His smoking use included Cigarettes. He has a 14.00 pack-year smoking history. He has never used smokeless tobacco. No results for input(s): HGBA1C, LABURIC in the last 8760 hours.  Objective:  VS:  HT:    WT:   BMI:     BP:(!) 141/88  HR:77bpm  TEMP: ( )  RESP:96 % Physical Exam  Constitutional: He is oriented to person, place, and time. He appears well-developed and well-nourished. No distress.   HENT:  Head: Normocephalic and atraumatic.  Nose: Nose normal.  Mouth/Throat: Oropharynx is clear and moist.  Eyes: Conjunctivae are normal. Pupils are equal, round, and reactive to light.  Neck: Normal range of motion. Neck supple.  Cardiovascular: Regular rhythm and intact distal pulses.   Pulmonary/Chest: Effort normal and breath sounds normal.  Abdominal: Soft. He exhibits no distension.  Musculoskeletal: He exhibits no deformity.  Lumbar spine evaluation shows scoliosis on forward flexion pain with extension rotation with hip rotation good distal strength without too much deficit although somewhat weak with the left dorsiflexion. He has no clonus bilaterally.  Neurological: He is alert and oriented to person, place, and time. No sensory deficit. Coordination normal.  Skin: Skin is warm. No rash noted.  Psychiatric: He has a normal mood and affect. His behavior is normal.  Nursing note and vitals reviewed.   Ortho Exam Imaging: No results found.  Past Medical/Family/Surgical/Social History: Medications & Allergies reviewed per EMR Patient Active Problem List   Diagnosis Date Noted  . Atrial fibrillation, permanent ([place]) 01/07/2016  . Acute on chronic combined systolic and diastolic ACC/AHA stage C congestive heart failure ([place]) 01/06/2016  . Orthostatic hypotension 02/06/2015  . Weakness 01/05/2015  . History of GI bleed 01/05/2015  . Hypokalemia 01/05/2015  . Chronic systolic CHF (congestive heart failure) ([place]) 01/03/2015  . Dyspnea 11/30/2014  . Fatigue 02/22/2014  . Coronary artery disease involving native coronary artery of native heart without angina pectoris   . Prostate cancer ([place])   . GI bleed 02/09/2014  . Chest pain 02/09/2014  . Malignant neoplasm of prostate ([place]) 12/15/2013  . Other malaise and fatigue 11/16/2013  . Chronic systolic heart failure ([place]) 10/12/2013  . Left bundle branch block 10/12/2013  . PAF (paroxysmal atrial fibrillation) ([place])  09/05/2013  . Angina, class III ([place]) 09/05/2013  . R wrist hematoma following coronary angiography   . Bradycardia   . Coronary atherosclerosis of native coronary artery 09/03/2013  . Mitral valve disorder 07/10/2013  . Essential hypertension, benign 07/10/2013  . Cough 07/10/2013  . Nodular prostate without urinary obstruction 01/08/2011  . Hypertrophy of prostate without urinary obstruction and other lower urinary tract symptoms (LUTS) 01/08/2011  . Impotence of organic origin 01/08/2011   Past Medical History:  Diagnosis Date  . Anxiety   . Arthritis   . Atrial fibrillation, permanent ([place]) 01/07/2016  . Blood transfusion    "related to a surgery"  . BPH (benign prostatic hypertrophy) with urinary obstruction    s/p turp yrs ago  . Bradycardia    a. Amio d/c'd 08/2013; brady arrest 08/2013 after PCI >>> recurrent AF >>> Amiodarone restarted. b. Pacemaker being considered in 11/2014.  [name] CAD (coronary artery disease)    a. s/p MI and prior PCI of LAD;  b. LHC (08/2013):  prox LAD 60-70%, mid LAD stents ok, ostial lesion at Dx jailed  by stent, mild CFX and RCA disesase >>>  PCI (09/08/13):  rotational atherectomy + Promus DES to prox LAD  . Cardiomyopathy ([place])    a. Echo (08/2013):  EF 30-35%, AS hypokinesis, Gr 1 diast dysfn, mild MR, mild LAE >>> b. improved EF 50-55% by echo 8/15. c. EF down again by echo 12/2014 to 30-35% but 51% by nuc.  [name] Chronic lower back pain   . Chronic systolic CHF (congestive heart failure) ([place])   . CKD (chronic kidney disease), stage III    a. Per review of labs baseline Cr 1.1-1.3.  [name] DDD (degenerative disc disease)    chronic back pain  . Depression   . Diverticulitis of colon with bleeding    s/p sigmoid resection '88  . DJD (degenerative joint disease) hips and knees   s/p bilateral total replacements  . GERD (gastroesophageal reflux disease)    occ. take prevacid  . History of GI diverticular bleed april 2012   transfused blood and resolved  without surgical intervention  . Hypertension   . Impaired hearing bilateral    only left hearing aid  . Impotence, organic    s/p penile prosthesis 1990's  . Incomplete bladder emptying   . LBBB (left bundle branch block)   . Meningioma ([place]) right -sided w/ right VI palsy   followed by dr Gaynell Face  . Mitral regurgitation    a. Mild-mod by echo 12/2014.  [name] Myocardial infarction 1980's- medical intervention   "so mild I didn't know I'd had it"  . PAF (paroxysmal atrial fibrillation) ([facility])    not on coumadin due to hx of GI bleed.  . Prostate cancer ([place]) 11/30/13   Gleason 8, volume 22.14 cc  . Sleep apnea    non-compliant cpap   Family History  Problem Relation Age of Onset  . Heart disease Mother   . Heart attack Mother   . Emphysema Father   . Cancer Brother     liver cancer  . Cancer     Past Surgical History:  Procedure Laterality Date  . APPENDECTOMY    . CARDIAC CATHETERIZATION  2007   noncritical cad (results w/ chart)  . CARDIOVERSION N/A 10/13/2015   Procedure: CARDIOVERSION;  Surgeon: Josue Hector, MD;  Location: [facility];  Service: Cardiovascular;  Laterality: N/A;  . CATARACT EXTRACTION W/ INTRAOCULAR LENS  IMPLANT, BILATERAL Bilateral ~ 2000  . CHOLECYSTECTOMY    . CLOSED REDUCTION HIP DISLOCATION Right "several"  . CORONARY ANGIOPLASTY WITH STENT PLACEMENT  08-03-08   drug-eluting stent x2 distal and mid lad  . EP IMPLANTABLE DEVICE N/A 01/31/2015   Procedure: BiV Pacemaker Insertion CRT-P;  Surgeon: Evans Lance, MD;  Location: [facility] LAB;  Service: Cardiovascular;  Laterality: N/A;  . EP IMPLANTABLE DEVICE N/A 02/07/2015   Procedure: Lead Revision/Repair;  Surgeon: Deboraha Sprang, MD;  Location: [facility];  Service: Cardiovascular;  Laterality: N/A;  . ESOPHAGOGASTRODUODENOSCOPY N/A 02/11/2014   Procedure: ESOPHAGOGASTRODUODENOSCOPY (EGD);  Surgeon: Cleotis Nipper, MD;  Location[facility] ENDOSCOPY;  Service: Endoscopy;  Laterality:  N/A;  . gamma knife radiation  2000   [facility] for meningioma, last eval 2013- no change  . INGUINAL HERNIA REPAIR Bilateral   . INNER EAR SURGERY Right yrs ago   "trying to get my hearing back  . LEFT HEART CATHETERIZATION WITH CORONARY ANGIOGRAM N/A 09/04/2013   Procedure: LEFT HEART CATHETERIZATION WITH CORONARY ANGIOGRAM;  Surgeon: Jettie Booze, MD;  Location: [facility] CATH LAB;  Service: Cardiovascular;  Laterality: N/A;  . PENILE PROSTHESIS IMPLANT  1990's  . PERCUTANEOUS CORONARY ROTOBLATOR INTERVENTION (PCI-R) N/A 09/08/2013   Procedure: PERCUTANEOUS CORONARY ROTOBLATOR INTERVENTION (PCI-R);  Surgeon: Jettie Booze, MD;  Location[facility] CATH LAB;  Service: Cardiovascular;  Laterality: N/A;  . PROSTATE BIOPSY  11/30/13   Gleason 8, vol 22.14 cc  . REVISION TOTAL KNEE ARTHROPLASTY Right   . SHOULDER OPEN ROTATOR CUFF REPAIR Left   . TOTAL HIP ARTHROPLASTY Right 03-25-08--  . TOTAL HIP ARTHROPLASTY Left 2005  . TOTAL HIP REVISION Right 3-4 times  . TOTAL KNEE ARTHROPLASTY Right 2004  . TOTAL KNEE ARTHROPLASTY Left 1997  . TRANSURETHRAL RESECTION OF PROSTATE  "years ago"  . TRANSURETHRAL RESECTION OF PROSTATE  01/08/2011   Procedure: TRANSURETHRAL RESECTION OF THE PROSTATE (TURP);  Surgeon: Franchot Gallo;  Location: [facility];  Service: Urology;  Laterality: N/A;  GYRUS    Social History   Occupational History  . Not on file.   Social History Main Topics  . Smoking status: Former Smoker    Packs/day: 1.00    Years: 14.00    Types: Cigarettes    Quit date: 01/05/1959  . Smokeless tobacco: Never Used  . Alcohol use 0.0 oz/week     Comment: 02/09/2014 "I'm not drinking anymore"  . Drug use: No  . Sexual activity: No

## 2016-02-02 DIAGNOSIS — M79605 Pain in left leg: Secondary | ICD-10-CM | POA: Diagnosis not present

## 2016-02-02 DIAGNOSIS — M519 Unspecified thoracic, thoracolumbar and lumbosacral intervertebral disc disorder: Secondary | ICD-10-CM | POA: Diagnosis not present

## 2016-02-02 DIAGNOSIS — M6281 Muscle weakness (generalized): Secondary | ICD-10-CM | POA: Diagnosis not present

## 2016-02-02 DIAGNOSIS — M545 Low back pain: Secondary | ICD-10-CM | POA: Diagnosis not present

## 2016-02-02 NOTE — Procedures (Signed)
Lumbosacral Transforaminal Epidural Steroid Injection - Infraneural Approach with Fluoroscopic Guidance  Patient: [name]      Date of Birth: [birth year] MRN: [id number] PCP: Wenda Low, MD      Visit Date: 02/01/2016   Universal Protocol:    Date/Time: 12/07/176:05 AM  Consent Given By: the patient  Position: PRONE   Additional Comments: Vital signs were monitored before and after the procedure. Patient was prepped and draped in the usual sterile fashion. The correct patient, procedure, and site was verified.   Injection Procedure Details:  Procedure Site One Meds Administered:  Meds ordered this encounter  Medications  . lidocaine (PF) (XYLOCAINE) 1 % injection 0.3 mL  . methylPREDNISolone acetate (DEPO-MEDROL) injection 80 mg      Laterality: Left  Location/Site:  L5-S1  Needle size: 22 G  Needle type: Spinal  Needle Placement: Transforaminal  Findings:  -Contrast Used: 1 mL iohexol 180 mg iodine/mL   -Comments: Excellent flow of contrast along the nerve and into the epidural space. There was no vascular uptake.  Procedure Details: After squaring off the end-plates of the desired vertebral level to get a true AP view, the C-arm was obliqued to the painful side so that the superior articulating process is positioned about 1/3 the length of the inferior endplate.  The needle was aimed toward the junction of the superior articular process and the transverse process of the inferior vertebrae. The needle's initial entry is in the lower third of the foramen through Kambin's triangle. The soft tissues overlying this target were infiltrated with 2-3 ml. of 1% Lidocaine without Epinephrine.  The spinal needle was then inserted and advanced toward the target using a "trajectory" view along the fluoroscope beam.  Under AP and lateral visualization, the needle was advanced so it did not puncture dura and did not traverse medially beyond the 6 o'clock position of the  pedicle. Bi-planar projections were used to confirm position. Aspiration was confirmed to be negative for CSF and/or blood. A 1-2 ml. volume of Isovue-250 was injected and flow of contrast was noted at each level. Radiographs were obtained for documentation purposes.   After attaining the desired flow of contrast documented above, a 0.5 to 1.0 ml test dose of 0.25% Marcaine was injected into each respective transforaminal space.  The patient was observed for 90 seconds post injection.  After no sensory deficits were reported, and normal lower extremity motor function was noted,   the above injectate was administered so that equal amounts of the injectate were placed at each foramen (level) into the transforaminal epidural space.   Additional Comments:  The patient tolerated the procedure well No complications occurred Dressing: Band-Aid    Post-procedure details: Patient was observed during the procedure. Post-procedure instructions were reviewed.  Patient left the clinic in stable condition.

## 2016-02-07 ENCOUNTER — Telehealth (INDEPENDENT_AMBULATORY_CARE_PROVIDER_SITE_OTHER): Payer: Self-pay | Admitting: Physical Medicine and Rehabilitation

## 2016-02-07 DIAGNOSIS — M6281 Muscle weakness (generalized): Secondary | ICD-10-CM | POA: Diagnosis not present

## 2016-02-07 DIAGNOSIS — M519 Unspecified thoracic, thoracolumbar and lumbosacral intervertebral disc disorder: Secondary | ICD-10-CM | POA: Diagnosis not present

## 2016-02-07 DIAGNOSIS — M79605 Pain in left leg: Secondary | ICD-10-CM | POA: Diagnosis not present

## 2016-02-07 DIAGNOSIS — M545 Low back pain: Secondary | ICD-10-CM | POA: Diagnosis not present

## 2016-02-07 NOTE — Telephone Encounter (Signed)
Patient called and left a message for me to call him to discuss his last injection and what his next steps are. I called him back, but no answer and no voicemail option after multiple rings.

## 2016-02-14 ENCOUNTER — Ambulatory Visit (INDEPENDENT_AMBULATORY_CARE_PROVIDER_SITE_OTHER): Payer: Commercial Managed Care - HMO | Admitting: *Deleted

## 2016-02-14 DIAGNOSIS — R001 Bradycardia, unspecified: Secondary | ICD-10-CM

## 2016-02-14 NOTE — Telephone Encounter (Signed)
Attempted to call patient 2 more times. Got message that sytem was having problems and to try again later.

## 2016-02-14 NOTE — Progress Notes (Signed)
Remote pacemaker transmission.   

## 2016-02-15 ENCOUNTER — Encounter: Payer: Self-pay | Admitting: Cardiology

## 2016-02-22 LAB — CUP PACEART REMOTE DEVICE CHECK
Battery Remaining Longevity: 85 mo
Battery Voltage: 2.99 V
Brady Statistic AP VS Percent: 1 %
Brady Statistic AS VP Percent: 45 %
Brady Statistic AS VS Percent: 1 %
Implantable Lead Implant Date: 20161205
Implantable Lead Implant Date: 20161205
Implantable Lead Location: 753859
Implantable Lead Location: 753860
Implantable Pulse Generator Implant Date: 20161205
Lead Channel Impedance Value: 1200 Ohm
Lead Channel Impedance Value: 460 Ohm
Lead Channel Impedance Value: 460 Ohm
Lead Channel Pacing Threshold Amplitude: 0.75 V
Lead Channel Pacing Threshold Amplitude: 0.75 V
Lead Channel Pacing Threshold Amplitude: 1.25 V
Lead Channel Pacing Threshold Pulse Width: 1 ms
Lead Channel Sensing Intrinsic Amplitude: 12 mV
Lead Channel Setting Pacing Amplitude: 2.75 V
Lead Channel Setting Pacing Pulse Width: 0.5 ms
Lead Channel Setting Sensing Sensitivity: 2 mV
MDC IDC LEAD IMPLANT DT: 20161205
MDC IDC LEAD LOCATION: 753858
MDC IDC MSMT BATTERY REMAINING PERCENTAGE: 95.5 %
MDC IDC MSMT LEADCHNL RA PACING THRESHOLD PULSEWIDTH: 0.5 ms
MDC IDC MSMT LEADCHNL RA SENSING INTR AMPL: 2 mV
MDC IDC MSMT LEADCHNL RV PACING THRESHOLD PULSEWIDTH: 0.5 ms
MDC IDC PG SERIAL: 7802901
MDC IDC SESS DTM: 20171219082354
MDC IDC SET LEADCHNL LV PACING AMPLITUDE: 1.75 V
MDC IDC SET LEADCHNL LV PACING PULSEWIDTH: 1 ms
MDC IDC SET LEADCHNL RV PACING AMPLITUDE: 2.5 V
MDC IDC STAT BRADY AP VP PERCENT: 53 %
MDC IDC STAT BRADY RA PERCENT PACED: 8.1 %

## 2016-03-08 ENCOUNTER — Encounter (INDEPENDENT_AMBULATORY_CARE_PROVIDER_SITE_OTHER): Payer: Commercial Managed Care - HMO | Admitting: Ophthalmology

## 2016-04-25 ENCOUNTER — Encounter: Payer: Self-pay | Admitting: Internal Medicine

## 2016-04-25 ENCOUNTER — Ambulatory Visit (INDEPENDENT_AMBULATORY_CARE_PROVIDER_SITE_OTHER): Payer: Medicare HMO | Admitting: Internal Medicine

## 2016-04-25 VITALS — BP 102/60 | HR 75 | Ht 68.0 in | Wt 189.8 lb

## 2016-04-25 DIAGNOSIS — I4821 Permanent atrial fibrillation: Secondary | ICD-10-CM

## 2016-04-25 DIAGNOSIS — I482 Chronic atrial fibrillation: Secondary | ICD-10-CM

## 2016-04-25 DIAGNOSIS — R001 Bradycardia, unspecified: Secondary | ICD-10-CM

## 2016-04-25 DIAGNOSIS — Z950 Presence of cardiac pacemaker: Secondary | ICD-10-CM

## 2016-04-25 DIAGNOSIS — I5022 Chronic systolic (congestive) heart failure: Secondary | ICD-10-CM

## 2016-04-25 LAB — CUP PACEART INCLINIC DEVICE CHECK
Brady Statistic RV Percent Paced: 72 %
Implantable Lead Implant Date: 20161205
Implantable Lead Location: 753859
Implantable Pulse Generator Implant Date: 20161205
Lead Channel Impedance Value: 1075 Ohm
Lead Channel Pacing Threshold Amplitude: 0.75 V
Lead Channel Pacing Threshold Amplitude: 1 V
Lead Channel Pacing Threshold Pulse Width: 0.5 ms
Lead Channel Setting Pacing Amplitude: 2 V
Lead Channel Setting Pacing Amplitude: 2.5 V
Lead Channel Setting Pacing Pulse Width: 1 ms
Lead Channel Setting Sensing Sensitivity: 2 mV
MDC IDC LEAD IMPLANT DT: 20161205
MDC IDC LEAD IMPLANT DT: 20161205
MDC IDC LEAD LOCATION: 753858
MDC IDC LEAD LOCATION: 753860
MDC IDC MSMT BATTERY VOLTAGE: 2.98 V
MDC IDC MSMT LEADCHNL LV PACING THRESHOLD AMPLITUDE: 1 V
MDC IDC MSMT LEADCHNL LV PACING THRESHOLD PULSEWIDTH: 1 ms
MDC IDC MSMT LEADCHNL LV PACING THRESHOLD PULSEWIDTH: 1 ms
MDC IDC MSMT LEADCHNL RA IMPEDANCE VALUE: 437.5 Ohm
MDC IDC MSMT LEADCHNL RA SENSING INTR AMPL: 0.8 mV
MDC IDC MSMT LEADCHNL RV IMPEDANCE VALUE: 462.5 Ohm
MDC IDC MSMT LEADCHNL RV PACING THRESHOLD AMPLITUDE: 0.75 V
MDC IDC MSMT LEADCHNL RV PACING THRESHOLD PULSEWIDTH: 0.5 ms
MDC IDC MSMT LEADCHNL RV SENSING INTR AMPL: 12 mV
MDC IDC PG SERIAL: 7802901
MDC IDC SESS DTM: 20180228112653
MDC IDC SET LEADCHNL RV PACING PULSEWIDTH: 0.5 ms
MDC IDC STAT BRADY RA PERCENT PACED: 6.5 %

## 2016-04-25 NOTE — Patient Instructions (Addendum)
Medication Instructions:  Your physician recommends that you continue on your current medications as directed. Please refer to the Current Medication list given to you today.   Labwork: None Ordered   Testing/Procedures: None Ordered   Follow-Up: Your physician wants you to follow-up in: 6 months with Dr. Lovena Le. You will receive a reminder letter in the mail two months in advance. If you don't receive a letter, please call our office to schedule the follow-up appointment.  Remote monitoring is used to monitor your Pacemaker from home. This monitoring reduces the number of office visits required to check your device to one time per year. It allows [place] to keep an eye on the functioning of your device to ensure it is working properly. You are scheduled for a device check from home on 07/25/16. You may send your transmission at any time that day. If you have a wireless device, the transmission will be sent automatically. After your physician reviews your transmission, you will receive a postcard with your next transmission date.    Any Other Special Instructions Will Be Listed Below (If Applicable).     If you need a refill on your cardiac medications before your next appointment, please call your pharmacy.

## 2016-04-25 NOTE — Progress Notes (Signed)
HPI [name] returns today for followup. I saw him in the office several months ago. He is s/p BiV PPM insertion and has developed worsening atrial fib with his symptoms mostly that of sob and fatigue. He has had a h/o GI bleeding on Warfarin and we tried to place him on amiodarone and brief eliquis but DCCV failed to maintain him in NSR. The patient has class 2 dyspnea but is also very sedentary. He has just returned for 5 weeks in [place]. He feels well but is unable to tolerate anti-coagularion due to bleeding. No more diaphragmatic stimulation.  Allergies  Allergen Reactions  . Protonix [Pantoprazole Sodium] Other (See Comments)    Gi upset, insomnia  . Codeine Anxiety and Other (See Comments)    Insomnia/ hyper  . Prilosec [Omeprazole] Nausea And Vomiting and Other (See Comments)    GI upset, insomnia   . Warfarin Sodium Anxiety and Other (See Comments)    Hx gi bleed      Current Outpatient Prescriptions  Medication Sig Dispense Refill  . acetaminophen (TYLENOL) 500 MG tablet Take 1,000 mg by mouth every 6 (six) hours as needed for headache (pain).     . brimonidine (ALPHAGAN) 0.15 % ophthalmic solution Place 1 drop into both eyes 2 (two) times daily.    . clopidogrel (PLAVIX) 75 MG tablet Take 1 tablet (75 mg total) by mouth daily. 30 tablet 6  . diazepam (VALIUM) 5 MG tablet Take 1 tablet (5 mg total) by mouth every 12 (twelve) hours as needed for anxiety. 5 tablet 0  . dorzolamide-timolol (COSOPT) 22.3-6.8 MG/ML ophthalmic solution Place 1 drop into both eyes 2 (two) times daily.   2  . finasteride (PROSCAR) 5 MG tablet Take 1 tablet (5 mg total) by mouth daily. 30 tablet 6  . Flaxseed, Linseed, (FLAX SEEDS PO) Take 15 mLs by mouth daily. Grind up seeds and take with honey    . fluticasone (FLONASE) 50 MCG/ACT nasal spray Place 2 sprays into both nostrils daily as needed for allergies.   1  . furosemide (LASIX) 20 MG tablet Take 20 mg up to BID daily as needed for  weight gain. 135 tablet 3  . Multiple Vitamins-Minerals (EMERGEN-C VITAMIN C) PACK Take 1,000 mg by mouth daily after lunch.    . Multiple Vitamins-Minerals (PRESERVISION AREDS 2) CAPS Take 1 capsule by mouth daily after lunch.    . nitroGLYCERIN (NITROSTAT) 0.4 MG SL tablet Place 1 tablet (0.4 mg total) under the tongue every 5 (five) minutes as needed for chest pain. 20 tablet 0  . Omega-3 Fatty Acids (FISH OIL PO) Take 1 capsule by mouth daily after lunch.    . potassium chloride (K-DUR) 10 MEQ tablet Take 5 mEq by mouth daily.     . Probiotic Product (PROBIOTIC PO) Take 1 tablet by mouth 2 (two) times daily.     . psyllium (METAMUCIL) 58.6 % packet Take 1 packet by mouth daily with supper. Mix in 8 oz liquid and drink    . tamsulosin (FLOMAX) 0.4 MG CAPS capsule Take 1 capsule (0.4 mg total) by mouth daily after supper. 30 capsule 6   No current facility-administered medications for this visit.      Past Medical History:  Diagnosis Date  . Anxiety   . Arthritis   . Atrial fibrillation, permanent ([place]) 01/07/2016  . Blood transfusion    "related to a surgery"  . BPH (benign prostatic hypertrophy) with urinary obstruction  s/p turp yrs ago  . Bradycardia    a. Amio d/c'd 08/2013; brady arrest 08/2013 after PCI >>> recurrent AF >>> Amiodarone restarted. b. Pacemaker being considered in 11/2014.  [name] CAD (coronary artery disease)    a. s/p MI and prior PCI of LAD;  b. LHC (08/2013):  prox LAD 60-70%, mid LAD stents ok, ostial lesion at Dx jailed by stent, mild CFX and RCA disesase >>>  PCI (09/08/13):  rotational atherectomy + Promus DES to prox LAD  . Cardiomyopathy ([place])    a. Echo (08/2013):  EF 30-35%, AS hypokinesis, Gr 1 diast dysfn, mild MR, mild LAE >>> b. improved EF 50-55% by echo 8/15. c. EF down again by echo 12/2014 to 30-35% but 51% by nuc.  [name] Chronic lower back pain   . Chronic systolic CHF (congestive heart failure) ([place])   . CKD (chronic kidney disease), stage III    a.  Per review of labs baseline Cr 1.1-1.3.  [name] DDD (degenerative disc disease)    chronic back pain  . Depression   . Diverticulitis of colon with bleeding    s/p sigmoid resection '88  . DJD (degenerative joint disease) hips and knees   s/p bilateral total replacements  . GERD (gastroesophageal reflux disease)    occ. take prevacid  . History of GI diverticular bleed april 2012   transfused blood and resolved without surgical intervention  . Hypertension   . Impaired hearing bilateral    only left hearing aid  . Impotence, organic    s/p penile prosthesis 1990's  . Incomplete bladder emptying   . LBBB (left bundle branch block)   . Meningioma ([place]) right -sided w/ right VI palsy   followed by dr Gaynell Face  . Mitral regurgitation    a. Mild-mod by echo 12/2014.  [name] Myocardial infarction 1980's- medical intervention   "so mild I didn't know I'd had it"  . PAF (paroxysmal atrial fibrillation) ([facility])    not on coumadin due to hx of GI bleed.  . Prostate cancer ([place]) 11/30/13   Gleason 8, volume 22.14 cc  . Sleep apnea    non-compliant cpap    ROS:   All systems reviewed and negative except as noted in the HPI.   Past Surgical History:  Procedure Laterality Date  . APPENDECTOMY    . CARDIAC CATHETERIZATION  2007   noncritical cad (results w/ chart)  . CARDIOVERSION N/A 10/13/2015   Procedure: CARDIOVERSION;  Surgeon: Josue Hector, MD;  Location: [facility];  Service: Cardiovascular;  Laterality: N/A;  . CATARACT EXTRACTION W/ INTRAOCULAR LENS  IMPLANT, BILATERAL Bilateral ~ 2000  . CHOLECYSTECTOMY    . CLOSED REDUCTION HIP DISLOCATION Right "several"  . CORONARY ANGIOPLASTY WITH STENT PLACEMENT  08-03-08   drug-eluting stent x2 distal and mid lad  . EP IMPLANTABLE DEVICE N/A 01/31/2015   Procedure: BiV Pacemaker Insertion CRT-P;  Surgeon: Evans Lance, MD;  Location: [facility] CV [facility];  Service: Cardiovascular;  Laterality: N/A;  . EP IMPLANTABLE DEVICE N/A 02/07/2015    Procedure: Lead Revision/Repair;  Surgeon: Deboraha Sprang, MD;  Location: [facility];  Service: Cardiovascular;  Laterality: N/A;  . ESOPHAGOGASTRODUODENOSCOPY N/A 02/11/2014   Procedure: ESOPHAGOGASTRODUODENOSCOPY (EGD);  Surgeon: Cleotis Nipper, MD;  Location: [facility] ENDOSCOPY;  Service: Endoscopy;  Laterality: N/A;  . gamma knife radiation  2000   [facility] for meningioma, last eval 2013- no change  . INGUINAL HERNIA REPAIR Bilateral   . INNER EAR SURGERY Right  yrs ago   "trying to get my hearing back  . LEFT HEART CATHETERIZATION WITH CORONARY ANGIOGRAM N/A 09/04/2013   Procedure: LEFT HEART CATHETERIZATION WITH CORONARY ANGIOGRAM;  Surgeon: Jettie Booze, MD;  Location: [facility] CATH LAB;  Service: Cardiovascular;  Laterality: N/A;  . PENILE PROSTHESIS IMPLANT  1990's  . PERCUTANEOUS CORONARY ROTOBLATOR INTERVENTION (PCI-R) N/A 09/08/2013   Procedure: PERCUTANEOUS CORONARY ROTOBLATOR INTERVENTION (PCI-R);  Surgeon: Jettie Booze, MD;  Location: [facility] CATH LAB;  Service: Cardiovascular;  Laterality: N/A;  . PROSTATE BIOPSY  11/30/13   Gleason 8, vol 22.14 cc  . REVISION TOTAL KNEE ARTHROPLASTY Right   . SHOULDER OPEN ROTATOR CUFF REPAIR Left   . TOTAL HIP ARTHROPLASTY Right 03-25-08--  . TOTAL HIP ARTHROPLASTY Left 2005  . TOTAL HIP REVISION Right 3-4 times  . TOTAL KNEE ARTHROPLASTY Right 2004  . TOTAL KNEE ARTHROPLASTY Left 1997  . TRANSURETHRAL RESECTION OF PROSTATE  "years ago"  . TRANSURETHRAL RESECTION OF PROSTATE  01/08/2011   Procedure: TRANSURETHRAL RESECTION OF THE PROSTATE (TURP);  Surgeon: Franchot Gallo;  Location: [facility];  Service: Urology;  Laterality: N/A;  GYRUS      Family History  Problem Relation Age of Onset  . Heart disease Mother   . Heart attack Mother   . Emphysema Father   . Cancer Brother     liver cancer  . Cancer       Social History   Social History  . Marital status: Widowed    Spouse name: N/A  . Number of  children: N/A  . Years of education: N/A   Occupational History  . Not on file.   Social History Main Topics  . Smoking status: Former Smoker    Packs/day: 1.00    Years: 14.00    Types: Cigarettes    Quit date: 01/05/1959  . Smokeless tobacco: Never Used  . Alcohol use 0.0 oz/week     Comment: 02/09/2014 "I'm not drinking anymore"  . Drug use: No  . Sexual activity: No   Other Topics Concern  . Not on file   Social History Narrative  . No narrative on file     BP 102/60   Pulse 75   Ht 5\' 8"  (1.727 m)   Wt 189 lb 12.8 oz (86.1 kg)   SpO2 97%   BMI 28.86 kg/m   Physical Exam:  Chronically ill appearing elderly man, NAD HEENT: Unremarkable Neck:  7 cm JVD, no thyromegally Back:  No CVA tenderness Lungs:  Clear with no wheezes, rales, or rhonchi. HEART:  IRegular rate rhythm, no murmurs, no rubs, no clicks Abd:  soft, positive bowel sounds, no organomegally, no rebound, no guarding Ext:  2 plus pulses, no edema, no cyanosis, no clubbing Skin:  No rashes no nodules Neuro:  CN II through XII intact, motor grossly intact  PM interogation - pacing less than 65% of the time. He was pacing subthreshold.  ECG - atrial fib with ventricular pacing  Assess/Plan: 1. Chronic systolic heart failure - his symptoms are 2B. He is encouraged to continue his oral lasix.  2. Atrial fib - attempts to get him back into NSR have been unsuccessful. He will continue rate control.   3. PM - today we reprogrammed his device to assure capture and avoid diaphragmatic stimulation.  4. Coags/Bleeding - he cannot take an [place]. He is back on Plavix.  Mikle Bosworth.D.

## 2016-04-26 ENCOUNTER — Encounter (INDEPENDENT_AMBULATORY_CARE_PROVIDER_SITE_OTHER): Payer: Medicare HMO | Admitting: Ophthalmology

## 2016-04-26 DIAGNOSIS — H353132 Nonexudative age-related macular degeneration, bilateral, intermediate dry stage: Secondary | ICD-10-CM

## 2016-04-26 DIAGNOSIS — I10 Essential (primary) hypertension: Secondary | ICD-10-CM | POA: Diagnosis not present

## 2016-04-26 DIAGNOSIS — H35033 Hypertensive retinopathy, bilateral: Secondary | ICD-10-CM | POA: Diagnosis not present

## 2016-04-26 DIAGNOSIS — H43813 Vitreous degeneration, bilateral: Secondary | ICD-10-CM

## 2016-04-26 DIAGNOSIS — H348322 Tributary (branch) retinal vein occlusion, left eye, stable: Secondary | ICD-10-CM

## 2016-04-27 ENCOUNTER — Encounter: Payer: Commercial Managed Care - HMO | Admitting: Internal Medicine

## 2016-05-01 DIAGNOSIS — E8770 Fluid overload, unspecified: Secondary | ICD-10-CM | POA: Diagnosis not present

## 2016-05-01 DIAGNOSIS — R609 Edema, unspecified: Secondary | ICD-10-CM | POA: Diagnosis not present

## 2016-05-21 ENCOUNTER — Telehealth: Payer: Self-pay | Admitting: Internal Medicine

## 2016-05-21 DIAGNOSIS — I5022 Chronic systolic (congestive) heart failure: Secondary | ICD-10-CM

## 2016-05-21 DIAGNOSIS — I9589 Other hypotension: Secondary | ICD-10-CM

## 2016-05-21 NOTE — Telephone Encounter (Signed)
New message     Pt calling to talk about adjusting his machine. He is asking if you can do it.

## 2016-05-21 NOTE — Telephone Encounter (Signed)
Patient called complaining of fatigue and "always cold". He was concerned that his blood pressure 111/66 was too low and causing these symptoms. He is requesting some medication changes be made. I told him I would route this to Dr. Lovena Le for further recommendations as I could not make any medications recommendations myself. He verbalized understanding.

## 2016-05-22 DIAGNOSIS — H02831 Dermatochalasis of right upper eyelid: Secondary | ICD-10-CM | POA: Diagnosis not present

## 2016-05-22 DIAGNOSIS — H401123 Primary open-angle glaucoma, left eye, severe stage: Secondary | ICD-10-CM | POA: Diagnosis not present

## 2016-05-22 DIAGNOSIS — H02834 Dermatochalasis of left upper eyelid: Secondary | ICD-10-CM | POA: Diagnosis not present

## 2016-05-22 DIAGNOSIS — H01024 Squamous blepharitis left upper eyelid: Secondary | ICD-10-CM | POA: Diagnosis not present

## 2016-05-22 DIAGNOSIS — H01025 Squamous blepharitis left lower eyelid: Secondary | ICD-10-CM | POA: Diagnosis not present

## 2016-05-22 DIAGNOSIS — H01022 Squamous blepharitis right lower eyelid: Secondary | ICD-10-CM | POA: Diagnosis not present

## 2016-05-22 DIAGNOSIS — H01021 Squamous blepharitis right upper eyelid: Secondary | ICD-10-CM | POA: Diagnosis not present

## 2016-05-22 DIAGNOSIS — H532 Diplopia: Secondary | ICD-10-CM | POA: Diagnosis not present

## 2016-05-22 DIAGNOSIS — H401112 Primary open-angle glaucoma, right eye, moderate stage: Secondary | ICD-10-CM | POA: Diagnosis not present

## 2016-05-23 NOTE — Telephone Encounter (Signed)
He is on nothing to lower his blood pressure. Lets have him obtain a TSH. GT

## 2016-05-24 NOTE — Telephone Encounter (Signed)
Pt will come by the office Monday for TSH. Pt agreeable to plan.

## 2016-05-28 ENCOUNTER — Ambulatory Visit (INDEPENDENT_AMBULATORY_CARE_PROVIDER_SITE_OTHER): Payer: Medicare HMO | Admitting: *Deleted

## 2016-05-28 ENCOUNTER — Other Ambulatory Visit: Payer: Medicare HMO | Admitting: *Deleted

## 2016-05-28 VITALS — BP 124/78 | HR 75

## 2016-05-28 DIAGNOSIS — I9589 Other hypotension: Secondary | ICD-10-CM | POA: Diagnosis not present

## 2016-05-28 DIAGNOSIS — Z950 Presence of cardiac pacemaker: Secondary | ICD-10-CM | POA: Diagnosis not present

## 2016-05-28 DIAGNOSIS — I5022 Chronic systolic (congestive) heart failure: Secondary | ICD-10-CM | POA: Diagnosis not present

## 2016-05-28 DIAGNOSIS — I482 Chronic atrial fibrillation: Secondary | ICD-10-CM

## 2016-05-28 DIAGNOSIS — I4821 Permanent atrial fibrillation: Secondary | ICD-10-CM

## 2016-05-28 NOTE — Progress Notes (Signed)
  Pt seen in clinic for device follow up.   No complaints of chest pain, shortness of breath, dizziness, palpitations.    BP 124/78 (BP Location: Right Arm, Patient Position: Sitting, Cuff Size: Normal)   Pulse 75   Current Meds  Medication Sig  . acetaminophen (TYLENOL) 500 MG tablet Take 1,000 mg by mouth every 6 (six) hours as needed for headache (pain).   [name] amoxicillin (AMOXIL) 500 MG capsule TAKE 4 CAPSULES BY MOUTH 1 HOUR PRIOR TO APPOINTMENT  . brimonidine (ALPHAGAN) 0.15 % ophthalmic solution Place 1 drop into both eyes 2 (two) times daily.  . clopidogrel (PLAVIX) 75 MG tablet Take 1 tablet (75 mg total) by mouth daily.  . diazepam (VALIUM) 5 MG tablet Take 1 tablet (5 mg total) by mouth every 12 (twelve) hours as needed for anxiety.  . dorzolamide-timolol (COSOPT) 22.3-6.8 MG/ML ophthalmic solution Place 1 drop into both eyes 2 (two) times daily.   . finasteride (PROSCAR) 5 MG tablet Take 1 tablet (5 mg total) by mouth daily.  . Flaxseed, Linseed, (FLAX SEEDS PO) Take 15 mLs by mouth daily. Grind up seeds and take with honey  . fluticasone (FLONASE) 50 MCG/ACT nasal spray Place 2 sprays into both nostrils daily as needed for allergies.   . furosemide (LASIX) 20 MG tablet Take 20 mg up to BID daily as needed for weight gain.  . Multiple Vitamins-Minerals (EMERGEN-C VITAMIN C) PACK Take 1,000 mg by mouth daily after lunch.  . Multiple Vitamins-Minerals (PRESERVISION AREDS 2) CAPS Take 1 capsule by mouth daily after lunch.  . nitroGLYCERIN (NITROSTAT) 0.4 MG SL tablet Place 1 tablet (0.4 mg total) under the tongue every 5 (five) minutes as needed for chest pain.  . Omega-3 Fatty Acids (FISH OIL PO) Take 1 capsule by mouth daily after lunch.  . potassium chloride (K-DUR) 10 MEQ tablet Take 5 mEq by mouth daily.   . Probiotic Product (PROBIOTIC PO) Take 1 tablet by mouth 2 (two) times daily.   . psyllium (METAMUCIL) 58.6 % packet Take 1 packet by mouth daily with supper. Mix in 8 oz  liquid and drink  . tamsulosin (FLOMAX) 0.4 MG CAPS capsule Take 1 capsule (0.4 mg total) by mouth daily after supper.    Device pocket well healed. Pt is enrolled in remote monitoring.   Device functioning normally at this time. For full details, see PaceArt report.  Pt seen d/t c/o of increased fatigue since programming changes made at last OV. Noted decrease in Bi-Vp likely d/t chronic atrial fibrillation. Per AS increased rate to 75bpm, ROV w/ Tommye Standard 4/12 to evaluate programming changes and possible rate control medications.  Plan to follow up in 06/07/2016 w/ Tommye Standard PA  Wanda Plump, RN 05/28/2016 3:15 PM

## 2016-05-28 NOTE — Addendum Note (Signed)
Addended by: Eulis Foster on: 05/28/2016 01:27 PM   Modules accepted: Orders

## 2016-05-29 LAB — CBC WITH DIFFERENTIAL/PLATELET
BASOS: 0 %
Basophils Absolute: 0 10*3/uL (ref 0.0–0.2)
EOS (ABSOLUTE): 0.2 10*3/uL (ref 0.0–0.4)
EOS: 4 %
HEMATOCRIT: 36.6 % — AB (ref 37.5–51.0)
Hemoglobin: 12.7 g/dL — ABNORMAL LOW (ref 13.0–17.7)
IMMATURE GRANS (ABS): 0 10*3/uL (ref 0.0–0.1)
IMMATURE GRANULOCYTES: 0 %
LYMPHS: 35 %
Lymphocytes Absolute: 2 10*3/uL (ref 0.7–3.1)
MCH: 30.8 pg (ref 26.6–33.0)
MCHC: 34.7 g/dL (ref 31.5–35.7)
MCV: 89 fL (ref 79–97)
Monocytes Absolute: 0.6 10*3/uL (ref 0.1–0.9)
Monocytes: 10 %
NEUTROS PCT: 51 %
Neutrophils Absolute: 2.9 10*3/uL (ref 1.4–7.0)
PLATELETS: 135 10*3/uL — AB (ref 150–379)
RBC: 4.12 x10E6/uL — AB (ref 4.14–5.80)
RDW: 14.6 % (ref 12.3–15.4)
WBC: 5.7 10*3/uL (ref 3.4–10.8)

## 2016-05-29 LAB — TSH: TSH: 2.68 u[IU]/mL (ref 0.450–4.500)

## 2016-05-30 ENCOUNTER — Telehealth: Payer: Self-pay | Admitting: Internal Medicine

## 2016-05-30 NOTE — Telephone Encounter (Signed)
New Message  Harmon Pier pt caregiver Called requesting to speak with RN about lab results. Please call back to discuss

## 2016-05-30 NOTE — Telephone Encounter (Signed)
Left message at home # listed on chart to return the call.  Of Note - I do not see a DPR to speak with Harmon Pier.  Only Ronalee Belts or [place].

## 2016-05-30 NOTE — Telephone Encounter (Signed)
Spoke with patient RE: recent lab results.  He states understanding of this.  He is complaining of his BP being low stating it was 99/44 this AM.  He c/o continued fatigue.  He is not on any medications to decrease his BP other than Furosemide 20 mg prn.  He reports he has not been eating/drinking well d/t a decreased appetite.  Advised to increase fluid intake and to call back if any further concerns/needs. He states understanding.

## 2016-06-01 ENCOUNTER — Ambulatory Visit (INDEPENDENT_AMBULATORY_CARE_PROVIDER_SITE_OTHER): Payer: Medicare HMO | Admitting: Cardiovascular Disease

## 2016-06-01 ENCOUNTER — Encounter: Payer: Self-pay | Admitting: Cardiovascular Disease

## 2016-06-01 VITALS — BP 130/58 | HR 74

## 2016-06-01 DIAGNOSIS — R079 Chest pain, unspecified: Secondary | ICD-10-CM

## 2016-06-01 DIAGNOSIS — R531 Weakness: Secondary | ICD-10-CM | POA: Diagnosis not present

## 2016-06-01 MED ORDER — CARVEDILOL 3.125 MG PO TABS: 3.1250 mg | ORAL_TABLET | Freq: Two times a day (BID) | ORAL | 3 refills | Status: DC

## 2016-06-01 NOTE — Patient Instructions (Addendum)
Medication Instructions:  Your physician has recommended you make the following change in your medication:  1-START Coreg 3.125 mg by mouth twice daily.  Labwork: NONE  Testing/Procedures: NONE  Follow-Up: Your physician recommends that you keep your follow-up appointment with Marinus Maw NP   If you need a refill on your cardiac medications before your next appointment, please call your pharmacy.

## 2016-06-01 NOTE — Progress Notes (Signed)
Cardiology Office Note   Date:  06/01/2016   ID:  [name], DOB 07-28-1926, MRN [id number]  PCP:  Wenda Low, MD  Cardiologist:   Jenkins Rouge, MD   No chief complaint on file.     History of Present Illness: [name] is a 83 y.o. male who presents acutely for fatigue and dyspnea. Patient showed up at office and was rather insistent on being seen Without an appointment. I was Reader A but was happy to see him to help staff out. He has ischemic DCM with EF 30-35%. Has a biV AICD. Seen In clinic recently and BiV pacing 10% less due to rapid afib with rates over 110 and more irregularity in his rhythm He has chronic afib and is only On plavix due to previous bleeding. He has no TIA symptoms. He is functional class 2B. He has recently come back from [place] Distant history of anterior MI with stenting LAD and atherectomy Non ischemic myovue 06/2014 Not having any chest Pain Concerned about fatigue , dyspnea and his BP being labile Indicates his pulse has been as low as 45 I explained to him this couldn't happen with his pacemaker and his BP machine can't detect every heart beat when He is in afib or bigeminy He is compliant with his meds     Past Medical History:  Diagnosis Date  . Anxiety   . Arthritis   . Atrial fibrillation, permanent ([place]) 01/07/2016  . Blood transfusion    "related to a surgery"  . BPH (benign prostatic hypertrophy) with urinary obstruction    s/p turp yrs ago  . Bradycardia    a. Amio d/c'd 08/2013; brady arrest 08/2013 after PCI >>> recurrent AF >>> Amiodarone restarted. b. Pacemaker being considered in 11/2014.  [name] CAD (coronary artery disease)    a. s/p MI and prior PCI of LAD;  b. LHC (08/2013):  prox LAD 60-70%, mid LAD stents ok, ostial lesion at Dx jailed by stent, mild CFX and RCA disesase >>>  PCI (09/08/13):  rotational atherectomy + Promus DES to prox LAD  . Cardiomyopathy ([place])    a. Echo (08/2013):  EF 30-35%, AS hypokinesis, Gr 1  diast dysfn, mild MR, mild LAE >>> b. improved EF 50-55% by echo 8/15. c. EF down again by echo 12/2014 to 30-35% but 51% by nuc.  [name] Chronic lower back pain   . Chronic systolic CHF (congestive heart failure) ([place])   . CKD (chronic kidney disease), stage III    a. Per review of labs baseline Cr 1.1-1.3.  [name] DDD (degenerative disc disease)    chronic back pain  . Depression   . Diverticulitis of colon with bleeding    s/p sigmoid resection '88  . DJD (degenerative joint disease) hips and knees   s/p bilateral total replacements  . GERD (gastroesophageal reflux disease)    occ. take prevacid  . History of GI diverticular bleed april 2012   transfused blood and resolved without surgical intervention  . Hypertension   . Impaired hearing bilateral    only left hearing aid  . Impotence, organic    s/p penile prosthesis 1990's  . Incomplete bladder emptying   . LBBB (left bundle branch block)   . Meningioma ([place]) right -sided w/ right VI palsy   followed by dr Gaynell Face  . Mitral regurgitation    a. Mild-mod by echo 12/2014.  [name] Myocardial infarction 1980's- medical intervention   "so mild I didn't know I'd had  it"  . PAF (paroxysmal atrial fibrillation) ([place])    not on coumadin due to hx of GI bleed.  . Prostate cancer ([place]) 11/30/13   Gleason 8, volume 22.14 cc  . Sleep apnea    non-compliant cpap    Past Surgical History:  Procedure Laterality Date  . APPENDECTOMY    . CARDIAC CATHETERIZATION  2007   noncritical cad (results w/ chart)  . CARDIOVERSION N/A 10/13/2015   Procedure: CARDIOVERSION;  Surgeon: Josue Hector, MD;  Location: [facility];  Service: Cardiovascular;  Laterality: N/A;  . CATARACT EXTRACTION W/ INTRAOCULAR LENS  IMPLANT, BILATERAL Bilateral ~ 2000  . CHOLECYSTECTOMY    . CLOSED REDUCTION HIP DISLOCATION Right "several"  . CORONARY ANGIOPLASTY WITH STENT PLACEMENT  08-03-08   drug-eluting stent x2 distal and mid lad  . EP IMPLANTABLE DEVICE N/A 01/31/2015     Procedure: BiV Pacemaker Insertion CRT-P;  Surgeon: Evans Lance, MD;  Location: [facility];  Service: Cardiovascular;  Laterality: N/A;  . EP IMPLANTABLE DEVICE N/A 02/07/2015   Procedure: Lead Revision/Repair;  Surgeon: Deboraha Sprang, MD;  Location: [facility];  Service: Cardiovascular;  Laterality: N/A;  . ESOPHAGOGASTRODUODENOSCOPY N/A 02/11/2014   Procedure: ESOPHAGOGASTRODUODENOSCOPY (EGD);  Surgeon: Cleotis Nipper, MD;  Location: [facility];  Service: Endoscopy;  Laterality: N/A;  . gamma knife radiation  2000   [facility] for meningioma, last eval 2013- no change  . INGUINAL HERNIA REPAIR Bilateral   . INNER EAR SURGERY Right yrs ago   "trying to get my hearing back  . LEFT HEART CATHETERIZATION WITH CORONARY ANGIOGRAM N/A 09/04/2013   Procedure: LEFT HEART CATHETERIZATION WITH CORONARY ANGIOGRAM;  Surgeon: Jettie Booze, MD;  Location: [facility] CATH LAB;  Service: Cardiovascular;  Laterality: N/A;  . PENILE PROSTHESIS IMPLANT  1990's  . PERCUTANEOUS CORONARY ROTOBLATOR INTERVENTION (PCI-R) N/A 09/08/2013   Procedure: PERCUTANEOUS CORONARY ROTOBLATOR INTERVENTION (PCI-R);  Surgeon: Jettie Booze, MD;  Location: [facility] CATH LAB;  Service: Cardiovascular;  Laterality: N/A;  . PROSTATE BIOPSY  11/30/13   Gleason 8, vol 22.14 cc  . REVISION TOTAL KNEE ARTHROPLASTY Right   . SHOULDER OPEN ROTATOR CUFF REPAIR Left   . TOTAL HIP ARTHROPLASTY Right 03-25-08--  . TOTAL HIP ARTHROPLASTY Left 2005  . TOTAL HIP REVISION Right 3-4 times  . TOTAL KNEE ARTHROPLASTY Right 2004  . TOTAL KNEE ARTHROPLASTY Left 1997  . TRANSURETHRAL RESECTION OF PROSTATE  "years ago"  . TRANSURETHRAL RESECTION OF PROSTATE  01/08/2011   Procedure: TRANSURETHRAL RESECTION OF THE PROSTATE (TURP);  Surgeon: Franchot Gallo;  Location: [facility];  Service: Urology;  Laterality: N/A;  GYRUS      Current Outpatient Prescriptions  Medication Sig Dispense Refill  . acetaminophen  (TYLENOL) 500 MG tablet Take 1,000 mg by mouth every 6 (six) hours as needed for headache (pain).     [name] amoxicillin (AMOXIL) 500 MG capsule TAKE 4 CAPSULES BY MOUTH 1 HOUR PRIOR TO APPOINTMENT  99  . brimonidine (ALPHAGAN) 0.15 % ophthalmic solution Place 1 drop into both eyes 2 (two) times daily.    . carvedilol (COREG) 3.125 MG tablet Take 1 tablet (3.125 mg total) by mouth 2 (two) times daily. 180 tablet 3  . clopidogrel (PLAVIX) 75 MG tablet Take 1 tablet (75 mg total) by mouth daily. 30 tablet 6  . diazepam (VALIUM) 5 MG tablet Take 1 tablet (5 mg total) by mouth every 12 (twelve) hours as needed for anxiety. 5 tablet 0  .  dorzolamide-timolol (COSOPT) 22.3-6.8 MG/ML ophthalmic solution Place 1 drop into both eyes 2 (two) times daily.   2  . finasteride (PROSCAR) 5 MG tablet Take 1 tablet (5 mg total) by mouth daily. 30 tablet 6  . Flaxseed, Linseed, (FLAX SEEDS PO) Take 15 mLs by mouth daily. Grind up seeds and take with honey    . fluticasone (FLONASE) 50 MCG/ACT nasal spray Place 2 sprays into both nostrils daily as needed for allergies.   1  . furosemide (LASIX) 20 MG tablet Take 20 mg up to BID daily as needed for weight gain. 135 tablet 3  . Multiple Vitamins-Minerals (EMERGEN-C VITAMIN C) PACK Take 1,000 mg by mouth daily after lunch.    . Multiple Vitamins-Minerals (PRESERVISION AREDS 2) CAPS Take 1 capsule by mouth daily after lunch.    . nitroGLYCERIN (NITROSTAT) 0.4 MG SL tablet Place 1 tablet (0.4 mg total) under the tongue every 5 (five) minutes as needed for chest pain. 20 tablet 0  . Omega-3 Fatty Acids (FISH OIL PO) Take 1 capsule by mouth daily after lunch.    . potassium chloride (K-DUR) 10 MEQ tablet Take 5 mEq by mouth daily.     . Probiotic Product (PROBIOTIC PO) Take 1 tablet by mouth 2 (two) times daily.     . psyllium (METAMUCIL) 58.6 % packet Take 1 packet by mouth daily with supper. Mix in 8 oz liquid and drink    . tamsulosin (FLOMAX) 0.4 MG CAPS capsule Take 1  capsule (0.4 mg total) by mouth daily after supper. 30 capsule 6  . torsemide (DEMADEX) 20 MG tablet Take 1 tablet by mouth daily.     No current facility-administered medications for this visit.     Allergies:   Protonix [pantoprazole sodium]; Codeine; Prilosec [omeprazole]; and Warfarin sodium    Social History:  The patient  reports that he quit smoking about 57 years ago. His smoking use included Cigarettes. He has a 14.00 pack-year smoking history. He has never used smokeless tobacco. He reports that he drinks alcohol. He reports that he does not use drugs.   Family History:  The patient's family history includes Cancer in his brother; Emphysema in his father; Heart attack in his mother; Heart disease in his mother.    ROS:  Please see the history of present illness.   Otherwise, review of systems are positive for none.   All other systems are reviewed and negative.    PHYSICAL EXAM: VS:  BP (!) 130/58 (BP Location: Right Arm, Patient Position: Sitting, Cuff Size: Normal)   Pulse 74  , BMI There is no height or weight on file to calculate BMI. Affect appropriate Elderly tanned white male  HEENT: normal Neck supple with no adenopathy JVP normal no bruits no thyromegaly Lungs clear with no wheezing and good diaphragmatic motion Heart:  S1/S2 loud MR  murmur, no rub, gallop or click PMI enlarged AICD under left clavicle  Abdomen: benighn, BS positve, no tenderness, no AAA no bruit.  No HSM or HJR Distal pulses intact with no bruits Plus one bilateral edema with varicosities and Right TKR Neuro non-focal Skin warm and dry No muscular weakness    EKG:   aifb ventricular bigeminy    Recent Labs: 01/06/2016: ALT 16; B Natriuretic Peptide 294.1; Hemoglobin 13.3 01/07/2016: BUN 8; Creatinine, Ser 1.09; Potassium 3.8; Sodium 135 05/28/2016: Platelets 135; TSH 2.680    Lipid Panel No results found for: CHOL, TRIG, HDL, CHOLHDL, VLDL, LDLCALC, LDLDIRECT  Wt Readings from  Last 3 Encounters:  04/25/16 189 lb 12.8 oz (86.1 kg)  01/10/16 183 lb 6.4 oz (83.2 kg)  01/07/16 178 lb 9.6 oz (81 kg)      Other studies Reviewed: Additional studies/ records that were reviewed today include: Epic chart Cath, Echo PaceArt notes and notes from nurses and GT.    ASSESSMENT AND PLAN:  1.  Dyspnea:  He appears euvolemic suspect issue is decreased BiV pacing due to increase afib rate and ectopy with PVCls Pacer rate increased but This will not help increase % of BiV pacing He has failed amiodarone in past. Will try him on low dose coreg for better rate control and hopefully suppress Some of his ectopy to regularize his afib and increase percentage of BiV pacing 2. Afib see above no signs of TIA on plavix and no anticoagulation per GT  3. Ischemia DCM:  No AICD shocks and no longer diaphragmatic stimulation BNP only 294 in November and ICM monitoring ok. No chest pain stable 4. Fatigue:  Hct and TSH normal 05/28/16 suspect related to age, poorly controlled afib rate and DCM 5. MR last echo 2016 MR graded as mild to moderate suspect it is ischemic MR and worse consider repeating echo to assess  Spent over 60 minutes reviewing chart and over 50% with direct patient care and counseling as well as consulting with EP nurses about AICD programming  He will f/u with GT next week   Start coreg 3.125 bid see above #1    Current medicines are reviewed at length with the patient today.  The patient does not have concerns regarding medicines.  The following changes have been made:  Coreg 3.125 bid   Labs/ tests ordered today include: None  Orders Placed This Encounter  Procedures  . EKG 12-Lead     Disposition:   FU with GT in a week      Signed, Jenkins Rouge, MD  06/01/2016 3:29 PM    [facility] [address], [place], [place]  [address] Phone: [phone]; Fax: [phone]

## 2016-06-04 ENCOUNTER — Telehealth: Payer: Self-pay

## 2016-06-04 NOTE — Telephone Encounter (Signed)
06/04/2016 16:05 (late entry 06/01/2016)  Writer was asked to see patient who presented with caregiver without an appointment complaining of fatigue and numbness on one side of his body. Pam Ingall and writer attempted to explain why we were unable to see patients without appointments.  Pt became more incessant and frustrated.  Dr Johnsie Cancel overheard the conversation and offered to see the patient.  Patient was added to Dr Kyla Balzarine schedule. Georgana Curio MHA RN CCM

## 2016-06-05 DIAGNOSIS — M4150 Other secondary scoliosis, site unspecified: Secondary | ICD-10-CM | POA: Diagnosis not present

## 2016-06-05 DIAGNOSIS — M549 Dorsalgia, unspecified: Secondary | ICD-10-CM | POA: Diagnosis not present

## 2016-06-05 DIAGNOSIS — M545 Low back pain: Secondary | ICD-10-CM | POA: Diagnosis not present

## 2016-06-06 NOTE — Progress Notes (Signed)
Cardiology Office Note Date:  06/07/2016  Patient ID:  ERASTUS BARTOLOMEI, DOB November 07, 1926, MRN [id number] PCP:  Wenda Low, MD  Cardiologist:  Dr. Irish Lack Electrophysiologist: Dr. Lovena Le    Chief Complaint: evaluation for CRTp, (Dr. Lovena Le)  History of Present Illness: KADEEN SROKA is a 83 y.o. male with history of PAFib not on a/c secondary to recurrent GIB history, CAD, meningioma, CRI, HTN, and bradycardia, LBBB, chronic CHF with LV dysfunction, w/CRT-P, OSA noncompliant with CPAP.    His bradycardia history includes a bradyarrest July 2015 after complex PCI, Prior to the intervention, his ejection fraction was in the 30-35% range. If his EF had remained depressed [name] was going to place a BiV pacemaker. Echocardiogram about a month after the intervention showed an improvement in his EF from 50-55%, without any clear symptoms of bradycardia and he has been observed outpatient. Since then his EF was again been found in the 30-35% range,  he had a myoview in may 2015 without ischemia, EF 30-35%.  He was in [facility] 01/03/15 with CHF exacerbation, during this stay he was evaluated by Dr. Lovena Le for evaluation of bradycardia with HR's 40's-50's.  It was felt that there was no immediate need for PPM implantation given he was asymptomatic at that time and being treated for his CHF, to bee considered for  CRT pacer electively.  He comes in today to be seen for Dr. Lovena Le, last seen by him in February, at that time noted to have worsening AF (historically had failed rhythm control strategies, and unable to maintain on a/c w/hx of bleeding) , most recently was seen by Dr. Johnsie Cancel as an unscheduled walk-in last week with c/o DOE, weakness and reported HR 40's at home. He was noted to have minimal BiVe pacing 2/2 AF and PVCs and was started on coreg with plans for EP f/u, not noted at that visit to have any evidence of fluid OL, CBC done at that visit noted stable H/H.  He comes today to f/u on this.   He says again this morning like he did last weak was sitting in his chair and felt very weak, like he was going to faint, his caregiver checked his BP she recalls about 90/60, HR 50.  He did not faint and just eventually felt better.  He has not had any kind of CP, no palpitations, no SOB.  He is very concerned about his slow HR and the weak spells he has had a couple times now.   Device information: SJM CRT-P, implanted 01/31/15, Dr. Lovena Le  AF Hx: Not on a/c with hx of bleeding Unsuccessful attempts at rhythm control with aniodarone and DCCV, rate control strategies going forward   Past Medical History:  Diagnosis Date  . Anxiety   . Arthritis   . Atrial fibrillation, permanent ([place]) 01/07/2016  . Blood transfusion    "related to a surgery"  . BPH (benign prostatic hypertrophy) with urinary obstruction    s/p turp yrs ago  . Bradycardia    a. Amio d/c'd 08/2013; brady arrest 08/2013 after PCI >>> recurrent AF >>> Amiodarone restarted. b. Pacemaker being considered in 11/2014.  [name] CAD (coronary artery disease)    a. s/p MI and prior PCI of LAD;  b. LHC (08/2013):  prox LAD 60-70%, mid LAD stents ok, ostial lesion at Dx jailed by stent, mild CFX and RCA disesase >>>  PCI (09/08/13):  rotational atherectomy + Promus DES to prox LAD  . Cardiomyopathy ([place])  a. Echo (08/2013):  EF 30-35%, AS hypokinesis, Gr 1 diast dysfn, mild MR, mild LAE >>> b. improved EF 50-55% by echo 8/15. c. EF down again by echo 12/2014 to 30-35% but 51% by nuc.  [name] Chronic lower back pain   . Chronic systolic CHF (congestive heart failure) ([place])   . CKD (chronic kidney disease), stage III    a. Per review of labs baseline Cr 1.1-1.3.  [name] DDD (degenerative disc disease)    chronic back pain  . Depression   . Diverticulitis of colon with bleeding    s/p sigmoid resection '88  . DJD (degenerative joint disease) hips and knees   s/p bilateral total replacements  . GERD (gastroesophageal reflux disease)    occ. take  prevacid  . History of GI diverticular bleed april 2012   transfused blood and resolved without surgical intervention  . Hypertension   . Impaired hearing bilateral    only left hearing aid  . Impotence, organic    s/p penile prosthesis 1990's  . Incomplete bladder emptying   . LBBB (left bundle branch block)   . Meningioma ([place]) right -sided w/ right VI palsy   followed by dr Gaynell Face  . Mitral regurgitation    a. Mild-mod by echo 12/2014.  [name] Myocardial infarction 1980's- medical intervention   "so mild I didn't know I'd had it"  . PAF (paroxysmal atrial fibrillation) ([facility])    not on coumadin due to hx of GI bleed.  . Prostate cancer ([place]) 11/30/13   Gleason 8, volume 22.14 cc  . Sleep apnea    non-compliant cpap    Past Surgical History:  Procedure Laterality Date  . APPENDECTOMY    . CARDIAC CATHETERIZATION  2007   noncritical cad (results w/ chart)  . CARDIOVERSION N/A 10/13/2015   Procedure: CARDIOVERSION;  Surgeon: Josue Hector, MD;  Location: [facility];  Service: Cardiovascular;  Laterality: N/A;  . CATARACT EXTRACTION W/ INTRAOCULAR LENS  IMPLANT, BILATERAL Bilateral ~ 2000  . CHOLECYSTECTOMY    . CLOSED REDUCTION HIP DISLOCATION Right "several"  . CORONARY ANGIOPLASTY WITH STENT PLACEMENT  08-03-08   drug-eluting stent x2 distal and mid lad  . EP IMPLANTABLE DEVICE N/A 01/31/2015   Procedure: BiV Pacemaker Insertion CRT-P;  Surgeon: Evans Lance, MD;  Location: [facility];  Service: Cardiovascular;  Laterality: N/A;  . EP IMPLANTABLE DEVICE N/A 02/07/2015   Procedure: Lead Revision/Repair;  Surgeon: Deboraha Sprang, MD;  Location: [facility];  Service: Cardiovascular;  Laterality: N/A;  . ESOPHAGOGASTRODUODENOSCOPY N/A 02/11/2014   Procedure: ESOPHAGOGASTRODUODENOSCOPY (EGD);  Surgeon: Cleotis Nipper, MD;  Location: [facility] ENDOSCOPY;  Service: Endoscopy;  Laterality: N/A;  . gamma knife radiation  2000   [facility] for meningioma, last eval 2013- no  change  . INGUINAL HERNIA REPAIR Bilateral   . INNER EAR SURGERY Right yrs ago   "trying to get my hearing back  . LEFT HEART CATHETERIZATION WITH CORONARY ANGIOGRAM N/A 09/04/2013   Procedure: LEFT HEART CATHETERIZATION WITH CORONARY ANGIOGRAM;  Surgeon: Jettie Booze, MD;  Location: [facility] CATH LAB;  Service: Cardiovascular;  Laterality: N/A;  . PENILE PROSTHESIS IMPLANT  1990's  . PERCUTANEOUS CORONARY ROTOBLATOR INTERVENTION (PCI-R) N/A 09/08/2013   Procedure: PERCUTANEOUS CORONARY ROTOBLATOR INTERVENTION (PCI-R);  Surgeon: Jettie Booze, MD;  Location: [facility] CATH LAB;  Service: Cardiovascular;  Laterality: N/A;  . PROSTATE BIOPSY  11/30/13   Gleason 8, vol 22.14 cc  . REVISION TOTAL KNEE ARTHROPLASTY Right   .  SHOULDER OPEN ROTATOR CUFF REPAIR Left   . TOTAL HIP ARTHROPLASTY Right 03-25-08--  . TOTAL HIP ARTHROPLASTY Left 2005  . TOTAL HIP REVISION Right 3-4 times  . TOTAL KNEE ARTHROPLASTY Right 2004  . TOTAL KNEE ARTHROPLASTY Left 1997  . TRANSURETHRAL RESECTION OF PROSTATE  "years ago"  . TRANSURETHRAL RESECTION OF PROSTATE  01/08/2011   Procedure: TRANSURETHRAL RESECTION OF THE PROSTATE (TURP);  Surgeon: Franchot Gallo;  Location: [facility];  Service: Urology;  Laterality: N/A;  GYRUS     Current Outpatient Prescriptions  Medication Sig Dispense Refill  . acetaminophen (TYLENOL) 500 MG tablet Take 1,000 mg by mouth every 6 (six) hours as needed for headache (pain).     [name] amoxicillin (AMOXIL) 500 MG capsule TAKE 4 CAPSULES BY MOUTH 1 HOUR PRIOR TO APPOINTMENT  99  . brimonidine (ALPHAGAN) 0.15 % ophthalmic solution Place 1 drop into both eyes 2 (two) times daily.    . carvedilol (COREG) 3.125 MG tablet Take 1 tablet (3.125 mg total) by mouth 2 (two) times daily. 180 tablet 3  . clopidogrel (PLAVIX) 75 MG tablet Take 1 tablet (75 mg total) by mouth daily. 30 tablet 6  . diazepam (VALIUM) 5 MG tablet Take 1 tablet (5 mg total) by mouth every 12 (twelve)  hours as needed for anxiety. 5 tablet 0  . dorzolamide-timolol (COSOPT) 22.3-6.8 MG/ML ophthalmic solution Place 1 drop into both eyes 2 (two) times daily.   2  . finasteride (PROSCAR) 5 MG tablet Take 1 tablet (5 mg total) by mouth daily. 30 tablet 6  . Flaxseed, Linseed, (FLAX SEEDS PO) Take 15 mLs by mouth daily. Grind up seeds and take with honey    . fluticasone (FLONASE) 50 MCG/ACT nasal spray Place 2 sprays into both nostrils daily as needed for allergies.   1  . furosemide (LASIX) 20 MG tablet Take 1 tablet (20 mg total) by mouth daily. 135 tablet 3  . Multiple Vitamins-Minerals (EMERGEN-C VITAMIN C) PACK Take 1,000 mg by mouth daily after lunch.    . Multiple Vitamins-Minerals (PRESERVISION AREDS 2) CAPS Take 1 capsule by mouth daily after lunch.    . nitroGLYCERIN (NITROSTAT) 0.4 MG SL tablet Place 1 tablet (0.4 mg total) under the tongue every 5 (five) minutes as needed for chest pain. 20 tablet 0  . Omega-3 Fatty Acids (FISH OIL PO) Take 1 capsule by mouth daily after lunch.    . potassium chloride (K-DUR) 10 MEQ tablet Take 5 mEq by mouth daily.     . Probiotic Product (PROBIOTIC PO) Take 1 tablet by mouth 2 (two) times daily.     . psyllium (METAMUCIL) 58.6 % packet Take 1 packet by mouth daily with supper. Mix in 8 oz liquid and drink    . tamsulosin (FLOMAX) 0.4 MG CAPS capsule Take 1 capsule (0.4 mg total) by mouth daily after supper. 30 capsule 6  . torsemide (DEMADEX) 20 MG tablet Take 1 tablet by mouth daily.     No current facility-administered medications for this visit.     Allergies:   Protonix [pantoprazole sodium]; Codeine; Prilosec [omeprazole]; and Warfarin sodium   Social History:  The patient  reports that he quit smoking about 57 years ago. His smoking use included Cigarettes. He has a 14.00 pack-year smoking history. He has never used smokeless tobacco. He reports that he drinks alcohol. He reports that he does not use drugs.   Family History:  The patient's  family history includes Cancer in his  brother; Emphysema in his father; Heart attack in his mother; Heart disease in his mother.  ROS:  Please see the history of present illness. All other systems are reviewed and otherwise negative.   PHYSICAL EXAM:  VS:  BP 114/68   Pulse 75   Ht 5\' 8"  (1.727 m)   Wt 192 lb (87.1 kg)   BMI 29.19 kg/m  BMI: Body mass index is 29.19 kg/m. Well nourished, elderly male in no acute distress  HEENT: normocephalic, atraumatic  Neck: no JVD, carotid bruits or masses Cardiac:  IRRR; bradycardic, soft SM, no rubs, or gallops Lungs:  CTA b/l, no wheezing, rhonchi or rales  Abd: soft, nontender, no ascites MS: no deformity or atrophy Ext: trace if any edema, skin is dry Skin: warm and dry, no rash Neuro:  No gross deficits appreciated Psych: euthymic mood, full affect     EKG:  Done 05/28/16: AFib, V paced w/bigemenal intrinic beats vs PVCs PPM interrogation done today by industry and reviewed by myself: Battery and lead measurements are stable, BiVe pacing only 73%, no high V rates observed CorView is above threshold  Recent Labs: 01/06/2016: ALT 16; B Natriuretic Peptide 294.1; Hemoglobin 13.3 01/07/2016: BUN 8; Creatinine, Ser 1.09; Potassium 3.8; Sodium 135 05/28/2016: Platelets 135; TSH 2.680   01/03/15 : AST 23,  ALT 18, TSH 0.932  CrCl cannot be calculated (Patient's most recent lab result is older than the maximum 21 days allowed.).   Wt Readings from Last 3 Encounters:  06/07/16 192 lb (87.1 kg)  04/25/16 189 lb 12.8 oz (86.1 kg)  01/10/16 183 lb 6.4 oz (83.2 kg)     Myoview Study Highlights from 06/2014    Intermediate stress nuclear study with chest tightness; ECG uninterpretable due to baseline LBBB; large, severe, fixed inferoseptal and apical defect consistent with prior infarct vs LBBB; no ischemia; moderate global reduction in LV function and LVE; study intermediate risk due to reduced LV function.                           12/28/14: Echocardiogram (limited) Study Conclusions - Left ventricle: Diffuse hypokinesis abnormal septal motion The cavity size was moderately dilated. Wall thickness was increased in a pattern of moderate LVH. Systolic function was moderately to severely reduced. The estimated ejection fraction was in the range of 30% to 35%. Doppler parameters are consistent with abnormal left ventricular relaxation (grade 1 diastolic dysfunction). - Mitral valve: Anteriorly directed MR with restricted posterior leaflet There was mild to moderate regurgitation. - Left atrium: The atrium was mildly dilated. - Atrial septum: No defect or patent foramen ovale was identified.  Other studies reviewed: as above  ASSESSMENT AND PLAN:   1. Bradycardia, symptomatic, s/p CRT-P             stable device function, no changes made             Home slow HR observations are from his BP cuff, likely inaccurate due to his AF,ectopy         2. CAD             Stable, last intervention July 2015             No angina             On Plavix              c/w Dr. Johnsie Cancel  3. ICM, CHF  He is compensated by exam             His exam does not suggest fluid OL, Corview is above threshold              He has been using his lasix most often BID (written for PRN use) as well as his daily demadex              4.  Permanent AF             CHADs2Vasc = 5, not on a/c secondary to his hx of recurrent GIB     Rate control strategy with faile rhythm control attempts             No high V rates noted on device     6. HTN             Appears well controlled  7.        Weak spells/near syncope            Unclear etiology, his pacer is working well, they report low BP readings this morning            Would like to keep the new coreg from last week to try and improve his BiVe pacing %            He is using BID lasix most days, ? If a little dry  I have asked him to decrease his lasix  use to only once daily, will get BMET today and refer him to L. Short, RN, ICM clinic to help monitor his fluid status and diuresis, he reports that he would very much like that monthly check in     Disposition: Will see him back in 1 month, sooner if needed, encouraged should he have recurrent or escalating symptoms to seek attention.   Current medicines are reviewed at length with the patient today.  Haywood Lasso, PA-C 06/07/2016 4:19 PM     [address] [place]  [phone] (office)  [phone] (fax)

## 2016-06-07 ENCOUNTER — Ambulatory Visit (INDEPENDENT_AMBULATORY_CARE_PROVIDER_SITE_OTHER): Payer: Medicare HMO | Admitting: Physician Assistant

## 2016-06-07 VITALS — BP 114/68 | HR 75 | Ht 68.0 in | Wt 192.0 lb

## 2016-06-07 DIAGNOSIS — I110 Hypertensive heart disease with heart failure: Secondary | ICD-10-CM

## 2016-06-07 DIAGNOSIS — R55 Syncope and collapse: Secondary | ICD-10-CM | POA: Diagnosis not present

## 2016-06-07 DIAGNOSIS — I482 Chronic atrial fibrillation: Secondary | ICD-10-CM | POA: Diagnosis not present

## 2016-06-07 DIAGNOSIS — Z79899 Other long term (current) drug therapy: Secondary | ICD-10-CM | POA: Diagnosis not present

## 2016-06-07 DIAGNOSIS — I2510 Atherosclerotic heart disease of native coronary artery without angina pectoris: Secondary | ICD-10-CM | POA: Diagnosis not present

## 2016-06-07 DIAGNOSIS — I255 Ischemic cardiomyopathy: Secondary | ICD-10-CM | POA: Diagnosis not present

## 2016-06-07 DIAGNOSIS — I4821 Permanent atrial fibrillation: Secondary | ICD-10-CM

## 2016-06-07 DIAGNOSIS — Z950 Presence of cardiac pacemaker: Secondary | ICD-10-CM | POA: Diagnosis not present

## 2016-06-07 MED ORDER — FUROSEMIDE 20 MG PO TABS: 20.0000 mg | ORAL_TABLET | Freq: Every day | ORAL | 3 refills | Status: DC

## 2016-06-07 NOTE — Patient Instructions (Signed)
Medication Instructions:   START TAKING LASIX ONCE A DAY   If you need a refill on your cardiac medications before your next appointment, please call your pharmacy.  Labwork: BMET TODAY    Testing/Procedures: NONE ORDERED  TODAY    Follow-Up: IN ONE MONTH WITH [name]    Any Other Special Instructions Will Be Listed Below (If Applicable).   YOU HAVE BEEN RECOMMENDED TO FOLLOW UP WITH LAURIE SHORT FOR HEART FAILURE CLINIC

## 2016-06-08 LAB — BASIC METABOLIC PANEL
BUN/Creatinine Ratio: 15 (ref 10–24)
BUN: 17 mg/dL (ref 8–27)
CALCIUM: 9.4 mg/dL (ref 8.6–10.2)
CO2: 26 mmol/L (ref 18–29)
Chloride: 95 mmol/L — ABNORMAL LOW (ref 96–106)
Creatinine, Ser: 1.1 mg/dL (ref 0.76–1.27)
GFR, EST AFRICAN AMERICAN: 69 mL/min/{1.73_m2} (ref >59)
GFR, EST NON AFRICAN AMERICAN: 60 mL/min/{1.73_m2} (ref >59)
Glucose: 88 mg/dL (ref 65–99)
Potassium: 3.4 mmol/L — ABNORMAL LOW (ref 3.5–5.2)
SODIUM: 135 mmol/L (ref 134–144)

## 2016-06-12 ENCOUNTER — Telehealth: Payer: Self-pay | Admitting: Internal Medicine

## 2016-06-12 NOTE — Telephone Encounter (Signed)
Received call transferred directly from operator and spoke with pt and his caregiver.  Pt reports his pulse has been 44/45 all day today.  BP 110/60.  He reports yesterday his pulse was in the 70's.  Reports feeling weak with no energy.  He does not know how to send transmission.  Will have device clinic contact him. He can be reached at home number.

## 2016-06-12 NOTE — Telephone Encounter (Signed)
Remote transmission reviewed. Presenting rhythm: Bp, Vs. Bp 74%. Base rate set at 75bpm, no episodes. Normal device function.  Informed patient about results of remote transmission. Patient states that his automatic BP cuff must be giving him an inaccurate HR. I explained to him that the automatic cuffs are often inaccurate for people who are in permanent AF with irregular HRs and for people with cardiac devices. I explained to him that being that he has permanent AF and a CRT-P he should take a manual reading of his HR. I instructed him on how he would take it radially. Patient verbalized understanding and appreciation of information provided.   Patient had no further questions at this time.

## 2016-06-12 NOTE — Telephone Encounter (Signed)
Spoke w/ pt and instructed him how to send a remote transmission using his home monitor. Informed pt that once the stars light up he can walk away from the monitor and the monitor will turn itself off after several minutes. Pt verbalized understanding.

## 2016-06-12 NOTE — Telephone Encounter (Signed)
New message    Pt caregiver is calling stating that pt pulse is 45 and he is not feeling well right now. He feels very weak.

## 2016-06-26 ENCOUNTER — Other Ambulatory Visit: Payer: Self-pay | Admitting: Internal Medicine

## 2016-06-29 NOTE — Progress Notes (Signed)
Received ICM clinic referral from [place], [place].  Will contact patient for ICM intro on 07/02/2016.

## 2016-07-02 ENCOUNTER — Ambulatory Visit (INDEPENDENT_AMBULATORY_CARE_PROVIDER_SITE_OTHER): Payer: Medicare HMO

## 2016-07-02 ENCOUNTER — Encounter: Payer: Self-pay | Admitting: Internal Medicine

## 2016-07-02 ENCOUNTER — Telehealth: Payer: Self-pay

## 2016-07-02 DIAGNOSIS — Z950 Presence of cardiac pacemaker: Secondary | ICD-10-CM

## 2016-07-02 DIAGNOSIS — I5022 Chronic systolic (congestive) heart failure: Secondary | ICD-10-CM

## 2016-07-02 NOTE — Telephone Encounter (Signed)
Received ICM clinic referral from [place], [place].   Attempted ICM intro call to patient and left message to return call.

## 2016-07-02 NOTE — Progress Notes (Signed)
EPIC Encounter for ICM Monitoring  Patient Name: [name] is a 83 y.o. male Date: 07/02/2016 Primary Care Physican: Wenda Low, MD Primary Cardiologist: Irish Lack Electrophysiologist: Lovena Le Dry Weight: unknown       1st attempted ICM intro call to patient or caregiver Sheila Oats and no answer.  Left message to return call.   Thoracic impedance normal but was abnormal suggesting fluid accumulation from 05/23/2016 to 05/30/2016, 06/08/2016 to 06/12/2016, 06/21/2016 to 06/25/2016.  Prescribed dosage: Furosemide 20 mg 1 tablet daily.  Potassium 10 mEq 0.5 tablet (5 mg total) daily.   Recommendations:  NONE - Unable to reach patient   Follow-up plan: ICM clinic phone appointment on 08/02/2016.  Pacer office appointment scheduled on 07/18/2016 with [name].  Copy of ICM check sent to device physician.   3 month ICM trend: 06/29/2016   1 Year ICM trend:      Rosalene Billings, RN 07/02/2016 9:10 AM

## 2016-07-03 NOTE — Progress Notes (Signed)
Reviewed with Dr Caryl Comes in office and cc'd changes to [name], Dr Irish Lack and Tommye Standard, [place].  Call to patient and Dr Caryl Comes advised patient to take Furosemide 40 mg AM and 40 mg PM x 3 days only.  Also advised to take Potassium 10 mEq 1 tablet in AM and 1 tablet in PM x 3 day only.  After 3rd day return to prescribed dosage, He and his caregiver verbalized understanding.  Advised should he experience any weakness, dizziness, cramping or any side effects to stop taking extra dosages and return to prescribed dosage.  BMET scheduled for 07/06/2016.    Patient confirmed he is not taking Torsemide.    ICM remote transmission changed from 08/02/2016 to 07/12/2016.

## 2016-07-03 NOTE — Progress Notes (Signed)
Patient returned call and provided ICM intro.  He agreed to monthly ICM follow up.  He reported symptoms of clear productive cough in the last week and slept in recliner last night due to felt he could not breathe well.  He reported these symptoms have been off and on for the last few weeks.    He is prescribed taking Furosemide 20 mg 1 tablets every AM but is taking 2 tablets every AM.  He stated he self adjusted the dosage after office visit with Tommye Standard, PA because he was having the above symptoms.   He is prescribed Potassium 10 mEq 0.5 tablet (5 mEq total) daily but he takes 1 tablet daily.   Labs: 06/07/2016 Creatinine 1.10, BUN 17, Potassium 3.4, Sodium 135, EGFR 60-69 01/07/2016 Creatinine 1.09, BUN 8,   Potassium 3.8, Sodium 135, EGFR 59->60  01/06/2016 Creatinine 1.10, BUN 15, Potassium 4.5, Sodium 130, EGFR 58->60  11/02/2015 Creatinine 1.13, BUN 10, Potassium 4.1, Sodium 124, EGFR 56->60  10/13/2015 Creatinine 1.30, BUN 17, Potassium 4.4, Sodium 137  05/19/2015 Creatinine 1.54, BUN 20, Potassium 4.4, Sodium 137, EGFR 39-45

## 2016-07-05 ENCOUNTER — Telehealth: Payer: Self-pay

## 2016-07-05 NOTE — Telephone Encounter (Signed)
Returned patient call as requested by voice mail.  No answer and left message stating I was returning call.  Provided ICM direct number for call back.

## 2016-07-06 ENCOUNTER — Other Ambulatory Visit: Payer: Medicare HMO | Admitting: *Deleted

## 2016-07-06 DIAGNOSIS — I5022 Chronic systolic (congestive) heart failure: Secondary | ICD-10-CM | POA: Diagnosis not present

## 2016-07-06 LAB — BASIC METABOLIC PANEL
BUN/Creatinine Ratio: 15 (ref 10–24)
BUN: 23 mg/dL (ref 8–27)
CHLORIDE: 95 mmol/L — AB (ref 96–106)
CO2: 23 mmol/L (ref 18–29)
CREATININE: 1.53 mg/dL — AB (ref 0.76–1.27)
Calcium: 9.3 mg/dL (ref 8.6–10.2)
GFR calc non Af Amer: 40 mL/min/{1.73_m2} — ABNORMAL LOW (ref >59)
GFR, EST AFRICAN AMERICAN: 47 mL/min/{1.73_m2} — AB (ref >59)
GLUCOSE: 115 mg/dL — AB (ref 65–99)
POTASSIUM: 3.7 mmol/L (ref 3.5–5.2)
SODIUM: 137 mmol/L (ref 134–144)

## 2016-07-11 ENCOUNTER — Telehealth: Payer: Self-pay | Admitting: *Deleted

## 2016-07-11 NOTE — Telephone Encounter (Signed)
-----   Message from [facility], [place] sent at 07/06/2016  4:56 PM EDT ----- Let the patient know his labs look OK given the recent extra lasix, repeat BMET in 2 weeks.  Thanks [employer]

## 2016-07-11 NOTE — Telephone Encounter (Signed)
[place] TO CALL BACK

## 2016-07-12 ENCOUNTER — Ambulatory Visit (INDEPENDENT_AMBULATORY_CARE_PROVIDER_SITE_OTHER): Payer: Self-pay

## 2016-07-12 DIAGNOSIS — Z950 Presence of cardiac pacemaker: Secondary | ICD-10-CM

## 2016-07-12 DIAGNOSIS — I5022 Chronic systolic (congestive) heart failure: Secondary | ICD-10-CM

## 2016-07-12 NOTE — Progress Notes (Signed)
EPIC Encounter for ICM Monitoring  Patient Name: [name] is a 83 y.o. male Date: 07/12/2016 Primary Care Physican: Wenda Low, MD Primary Cardiologist: Irish Lack Electrophysiologist: Lovena Le Dry Weight:    unknown      Heart Failure questions reviewed, pt reported he still has slight cough but has improved.  He stated he is feeling better.    Thoracic impedance normal after 3 days of increased Furosemide.  Prescribed dosage: Furosemide 20 mg 1 tablet daily.  Potassium 10 mEq 0.5 tablet (5 mg total) daily.        Labs:      07/06/2016 Creatinine 1.53, BUN 23, Potassium 3.7, Sodium 137, EGFR 40-47      06/07/2016 Creatinine 1.10, BUN 17, Potassium 3.4, Sodium 135, EGFR 60-69      01/07/2016 Creatinine 1.09, BUN 8,   Potassium 3.8, Sodium 135, EGFR 59->60       01/06/2016 Creatinine 1.10, BUN 15, Potassium 4.5, Sodium 130, EGFR 58->60       11/02/2015 Creatinine 1.13, BUN 10, Potassium 4.1, Sodium 124, EGFR 56->60       10/13/2015 Creatinine 1.30, BUN 17, Potassium 4.4, Sodium 137       05/19/2015 Creatinine 1.54, BUN 20, Potassium 4.4, Sodium 137, EGFR 39-45   Recommendations: He plans to go to [place] for 2 weeks and leave next week after appt with [name].  Advised CMA has attempted to call with lab results and reviewed results with him.  Advised Tommye Standard, PA ordered BMET to be drawn to recheck Creatinine.  Advised message sent to [name] and Tommye Standard, PA to have lab drawn at time of his office appt next week, 07/18/2016.  He leaves for 2 week trip to [place] on 07/19/2016.  Follow-up plan: ICM clinic phone appointment on 08/20/2016.  Office appointment scheduled on 07/18/2016 with [name] for pacer check.  Copy of ICM check sent to primary cardiologist and device physician.   3 month ICM trend: 07/12/2016   1 Year ICM trend:      Rosalene Billings, RN 07/12/2016 9:21 AM

## 2016-07-18 ENCOUNTER — Ambulatory Visit (INDEPENDENT_AMBULATORY_CARE_PROVIDER_SITE_OTHER): Payer: Medicare HMO | Admitting: Internal Medicine

## 2016-07-18 ENCOUNTER — Encounter: Payer: Self-pay | Admitting: Internal Medicine

## 2016-07-18 VITALS — BP 112/56 | HR 52 | Ht 67.0 in | Wt 190.0 lb

## 2016-07-18 DIAGNOSIS — I482 Chronic atrial fibrillation: Secondary | ICD-10-CM

## 2016-07-18 DIAGNOSIS — I5022 Chronic systolic (congestive) heart failure: Secondary | ICD-10-CM

## 2016-07-18 DIAGNOSIS — Z950 Presence of cardiac pacemaker: Secondary | ICD-10-CM

## 2016-07-18 DIAGNOSIS — R001 Bradycardia, unspecified: Secondary | ICD-10-CM | POA: Diagnosis not present

## 2016-07-18 DIAGNOSIS — I4821 Permanent atrial fibrillation: Secondary | ICD-10-CM

## 2016-07-18 NOTE — Patient Instructions (Signed)
Medication Instructions:  Your physician recommends that you continue on your current medications as directed. Please refer to the Current Medication list given to you today.   Labwork: None Ordered   Testing/Procedures:  None Ordered    Follow-Up: Your physician wants you to follow-up in: 1 year with Dr. Lovena Le. You will receive a reminder letter in the mail two months in advance. If you don't receive a letter, please call our office to schedule the follow-up appointment.  Remote monitoring is used to monitor your Pacemaker from home. This monitoring reduces the number of office visits required to check your device to one time per year. It allows [place] to keep an eye on the functioning of your device to ensure it is working properly. You are scheduled for a device check from home on 10/17/16. You may send your transmission at any time that day. If you have a wireless device, the transmission will be sent automatically. After your physician reviews your transmission, you will receive a postcard with your next transmission date.     Any Other Special Instructions Will Be Listed Below (If Applicable).     If you need a refill on your cardiac medications before your next appointment, please call your pharmacy.

## 2016-07-18 NOTE — Progress Notes (Signed)
HPI [name] returns today for followup. He is a pleasant 83 yo man with PAF, CAD, s/p stenting, h/o GI bleeding, unable to take systemic anti-coagulation, chronic systolic heart failure, and intermittent CHB, who underwent Biv PM insertion over a year ago. In the interim, he has been stable. He is sedentary but doing reasonably well for his advanced age. He notes that he is traveling down to [place] in a few weeks. He denies being in the hospital in the interim. No syncope and he does not experience palpitations.  Allergies  Allergen Reactions  . Protonix [Pantoprazole Sodium] Other (See Comments)    Gi upset, insomnia  . Codeine Anxiety and Other (See Comments)    Insomnia/ hyper  . Prilosec [Omeprazole] Nausea And Vomiting and Other (See Comments)    GI upset, insomnia   . Warfarin Sodium Anxiety and Other (See Comments)    Hx gi bleed      Current Outpatient Prescriptions  Medication Sig Dispense Refill  . acetaminophen (TYLENOL) 500 MG tablet Take 1,000 mg by mouth every 6 (six) hours as needed for headache (pain).     [name] amoxicillin (AMOXIL) 500 MG capsule TAKE 4 CAPSULES BY MOUTH 1 HOUR PRIOR TO APPOINTMENT  99  . brimonidine (ALPHAGAN) 0.15 % ophthalmic solution Place 1 drop into both eyes 2 (two) times daily.    . carvedilol (COREG) 3.125 MG tablet Take 1 tablet (3.125 mg total) by mouth 2 (two) times daily. 180 tablet 3  . clopidogrel (PLAVIX) 75 MG tablet Take 1 tablet (75 mg total) by mouth daily. 30 tablet 6  . diazepam (VALIUM) 5 MG tablet Take 1 tablet (5 mg total) by mouth every 12 (twelve) hours as needed for anxiety. 5 tablet 0  . dorzolamide-timolol (COSOPT) 22.3-6.8 MG/ML ophthalmic solution Place 1 drop into both eyes 2 (two) times daily.   2  . finasteride (PROSCAR) 5 MG tablet Take 1 tablet (5 mg total) by mouth daily. 30 tablet 6  . Flaxseed, Linseed, (FLAX SEEDS PO) Take 15 mLs by mouth daily. Grind up seeds and take with honey    . fluticasone  (FLONASE) 50 MCG/ACT nasal spray Place 2 sprays into both nostrils daily as needed for allergies.   1  . furosemide (LASIX) 20 MG tablet Take 1 tablet (20 mg total) by mouth daily. (Patient taking differently: Take 20 mg by mouth daily. 07/03/16 Pt reported taking 2 tablets (40 mg total) daily) 135 tablet 3  . Multiple Vitamins-Minerals (EMERGEN-C VITAMIN C) PACK Take 1,000 mg by mouth daily after lunch.    . Multiple Vitamins-Minerals (PRESERVISION AREDS 2) CAPS Take 1 capsule by mouth daily after lunch.    . nitroGLYCERIN (NITROSTAT) 0.4 MG SL tablet Place 1 tablet (0.4 mg total) under the tongue every 5 (five) minutes as needed for chest pain. 20 tablet 0  . Omega-3 Fatty Acids (FISH OIL PO) Take 1 capsule by mouth daily after lunch.    . potassium chloride (K-DUR) 10 MEQ tablet Take 5 mEq by mouth daily. 07/03/16 Pt reported taking 1 tablet (10 mEq) daily.    . Probiotic Product (PROBIOTIC PO) Take 1 tablet by mouth 2 (two) times daily.     . psyllium (METAMUCIL) 58.6 % packet Take 1 packet by mouth daily with supper. Mix in 8 oz liquid and drink    . tamsulosin (FLOMAX) 0.4 MG CAPS capsule Take 1 capsule (0.4 mg total) by mouth daily after supper. 30 capsule 6  .  torsemide (DEMADEX) 20 MG tablet Take 1 tablet by mouth daily.     No current facility-administered medications for this visit.      Past Medical History:  Diagnosis Date  . Anxiety   . Arthritis   . Atrial fibrillation, permanent ([place]) 01/07/2016  . Blood transfusion    "related to a surgery"  . BPH (benign prostatic hypertrophy) with urinary obstruction    s/p turp yrs ago  . Bradycardia    a. Amio d/c'd 08/2013; brady arrest 08/2013 after PCI >>> recurrent AF >>> Amiodarone restarted. b. Pacemaker being considered in 11/2014.  [name] CAD (coronary artery disease)    a. s/p MI and prior PCI of LAD;  b. LHC (08/2013):  prox LAD 60-70%, mid LAD stents ok, ostial lesion at Dx jailed by stent, mild CFX and RCA disesase >>>  PCI (09/08/13):   rotational atherectomy + Promus DES to prox LAD  . Cardiomyopathy ([place])    a. Echo (08/2013):  EF 30-35%, AS hypokinesis, Gr 1 diast dysfn, mild MR, mild LAE >>> b. improved EF 50-55% by echo 8/15. c. EF down again by echo 12/2014 to 30-35% but 51% by nuc.  [name] Chronic lower back pain   . Chronic systolic CHF (congestive heart failure) ([place])   . CKD (chronic kidney disease), stage III    a. Per review of labs baseline Cr 1.1-1.3.  [name] DDD (degenerative disc disease)    chronic back pain  . Depression   . Diverticulitis of colon with bleeding    s/p sigmoid resection '88  . DJD (degenerative joint disease) hips and knees   s/p bilateral total replacements  . GERD (gastroesophageal reflux disease)    occ. take prevacid  . History of GI diverticular bleed april 2012   transfused blood and resolved without surgical intervention  . Hypertension   . Impaired hearing bilateral    only left hearing aid  . Impotence, organic    s/p penile prosthesis 1990's  . Incomplete bladder emptying   . LBBB (left bundle branch block)   . Meningioma ([place]) right -sided w/ right VI palsy   followed by dr Gaynell Face  . Mitral regurgitation    a. Mild-mod by echo 12/2014.  [name] Myocardial infarction [facility]) 1980's- medical intervention   "so mild I didn't know I'd had it"  . PAF (paroxysmal atrial fibrillation) ([facility])    not on coumadin due to hx of GI bleed.  . Prostate cancer ([place]) 11/30/13   Gleason 8, volume 22.14 cc  . Sleep apnea    non-compliant cpap    ROS:   All systems reviewed and negative except as noted in the HPI.   Past Surgical History:  Procedure Laterality Date  . APPENDECTOMY    . CARDIAC CATHETERIZATION  2007   noncritical cad (results w/ chart)  . CARDIOVERSION N/A 10/13/2015   Procedure: CARDIOVERSION;  Surgeon: Josue Hector, MD;  Location: [facility];  Service: Cardiovascular;  Laterality: N/A;  . CATARACT EXTRACTION W/ INTRAOCULAR LENS  IMPLANT, BILATERAL Bilateral ~ 2000  .  CHOLECYSTECTOMY    . CLOSED REDUCTION HIP DISLOCATION Right "several"  . CORONARY ANGIOPLASTY WITH STENT PLACEMENT  08-03-08   drug-eluting stent x2 distal and mid lad  . EP IMPLANTABLE DEVICE N/A 01/31/2015   Procedure: BiV Pacemaker Insertion CRT-P;  Surgeon: Evans Lance, MD;  Location: [place] CV LAB;  Service: Cardiovascular;  Laterality: N/A;  . EP IMPLANTABLE DEVICE N/A 02/07/2015   Procedure: Lead Revision/Repair;  Surgeon:  Deboraha Sprang, MD;  Location: [facility] CV LAB;  Service: Cardiovascular;  Laterality: N/A;  . ESOPHAGOGASTRODUODENOSCOPY N/A 02/11/2014   Procedure: ESOPHAGOGASTRODUODENOSCOPY (EGD);  Surgeon: Cleotis Nipper, MD;  Location[facility] ENDOSCOPY;  Service: Endoscopy;  Laterality: N/A;  . gamma knife radiation  2000   [facility] for meningioma, last eval 2013- no change  . INGUINAL HERNIA REPAIR Bilateral   . INNER EAR SURGERY Right yrs ago   "trying to get my hearing back  . LEFT HEART CATHETERIZATION WITH CORONARY ANGIOGRAM N/A 09/04/2013   Procedure: LEFT HEART CATHETERIZATION WITH CORONARY ANGIOGRAM;  Surgeon: Jettie Booze, MD;  Location: [facility] CATH LAB;  Service: Cardiovascular;  Laterality: N/A;  . PENILE PROSTHESIS IMPLANT  1990's  . PERCUTANEOUS CORONARY ROTOBLATOR INTERVENTION (PCI-R) N/A 09/08/2013   Procedure: PERCUTANEOUS CORONARY ROTOBLATOR INTERVENTION (PCI-R);  Surgeon: Jettie Booze, MD;  Location: [facility] CATH LAB;  Service: Cardiovascular;  Laterality: N/A;  . PROSTATE BIOPSY  11/30/13   Gleason 8, vol 22.14 cc  . REVISION TOTAL KNEE ARTHROPLASTY Right   . SHOULDER OPEN ROTATOR CUFF REPAIR Left   . TOTAL HIP ARTHROPLASTY Right 03-25-08--  . TOTAL HIP ARTHROPLASTY Left 2005  . TOTAL HIP REVISION Right 3-4 times  . TOTAL KNEE ARTHROPLASTY Right 2004  . TOTAL KNEE ARTHROPLASTY Left 1997  . TRANSURETHRAL RESECTION OF PROSTATE  "years ago"  . TRANSURETHRAL RESECTION OF PROSTATE  01/08/2011   Procedure: TRANSURETHRAL RESECTION OF THE PROSTATE  (TURP);  Surgeon: Franchot Gallo;  Location: [facility];  Service: Urology;  Laterality: N/A;  GYRUS      Family History  Problem Relation Age of Onset  . Heart disease Mother   . Heart attack Mother   . Emphysema Father   . Cancer Brother        liver cancer  . Cancer Unknown      Social History   Social History  . Marital status: Widowed    Spouse name: N/A  . Number of children: N/A  . Years of education: N/A   Occupational History  . Not on file.   Social History Main Topics  . Smoking status: Former Smoker    Packs/day: 1.00    Years: 14.00    Types: Cigarettes    Quit date: 01/05/1959  . Smokeless tobacco: Never Used  . Alcohol use 0.0 oz/week     Comment: 02/09/2014 "I'm not drinking anymore"  . Drug use: No  . Sexual activity: No   Other Topics Concern  . Not on file   Social History Narrative  . No narrative on file     BP (!) 112/56   Pulse (!) 52   Ht 5\' 7"  (1.702 m)   Wt 190 lb (86.2 kg)   SpO2 96%   BMI 29.76 kg/m   Physical Exam:  Well appearing elderly man, NAD HEENT: Unremarkable Neck:  6 cm JVD, no thyromegally Lymphatics:  No adenopathy Back:  No CVA tenderness Lungs:  Clear with minimal basilar rales HEART:  Regular rate rhythm, no murmurs, no rubs, no clicks Abd:  soft, positive bowel sounds, no organomegally, no rebound, no guarding Ext:  2 plus pulses, 1+ edema, no cyanosis, no clubbing Skin:  No rashes no nodules Neuro:  CN II through XII intact, motor grossly intact  EKG - none today  DEVICE  Normal device function.  See PaceArt for details.   Assess/Plan: 1. Atrial fib - he now has persistent atrial fib. His rate control is good.  He refuses/unable to take systemic anti-coagulation though I have recommended in the past. 2. Myocardial infarction - he has obstructive CAD which appears to be well controlled. No angina. No change in meds. 3. BiV PPM - his [employer] BiV PPM is working normally. Will  recheck in several months. 4. Chronic systolic heart failure - his symptoms are well controlled.   Mikle Bosworth.D.

## 2016-08-06 DIAGNOSIS — K219 Gastro-esophageal reflux disease without esophagitis: Secondary | ICD-10-CM | POA: Diagnosis not present

## 2016-08-06 DIAGNOSIS — R0789 Other chest pain: Secondary | ICD-10-CM | POA: Diagnosis not present

## 2016-08-06 DIAGNOSIS — K529 Noninfective gastroenteritis and colitis, unspecified: Secondary | ICD-10-CM | POA: Diagnosis not present

## 2016-08-06 DIAGNOSIS — R197 Diarrhea, unspecified: Secondary | ICD-10-CM | POA: Diagnosis not present

## 2016-08-20 ENCOUNTER — Telehealth: Payer: Self-pay

## 2016-08-20 ENCOUNTER — Ambulatory Visit (INDEPENDENT_AMBULATORY_CARE_PROVIDER_SITE_OTHER): Payer: Medicare HMO

## 2016-08-20 DIAGNOSIS — Z950 Presence of cardiac pacemaker: Secondary | ICD-10-CM

## 2016-08-20 DIAGNOSIS — I5022 Chronic systolic (congestive) heart failure: Secondary | ICD-10-CM

## 2016-08-20 NOTE — Progress Notes (Signed)
EPIC Encounter for ICM Monitoring  Patient Name: [name] is a 83 y.o. male Date: 08/20/2016 Primary Care Physican: Wenda Low, MD Primary Cardiologist: Irish Lack Electrophysiologist: Druscilla Brownie Weight:unknown         Attempted call to patient and unable to reach.  Left message to return call.  Transmission reviewed.    Thoracic impedance abnormal suggesting fluid accumulation starting 08/17/2016.  Impedance also abnormal from 07/15/2016 to 07/26/2016 and 07/30/2016 to 08/06/2016  Prescribed dosage: Furosemide 20 mg 1 tablet daily. Potassium 10 mEq 0.5 tablet (5 mg total) daily.        Labs:      07/06/2016 Creatinine 1.53, BUN 23, Potassium 3.7, Sodium 137, EGFR 40-47      06/07/2016 Creatinine 1.10, BUN 17, Potassium 3.4, Sodium 135, EGFR 60-69      01/07/2016 Creatinine 1.09, BUN 8, Potassium 3.8, Sodium 135, EGFR 59->60       01/06/2016 Creatinine 1.10, BUN 15, Potassium 4.5, Sodium 130, EGFR 58->60       11/02/2015 Creatinine 1.13, BUN 10, Potassium 4.1, Sodium 124, EGFR 56->60       10/13/2015 Creatinine 1.30, BUN 17, Potassium 4.4, Sodium 137       05/19/2015 Creatinine 1.54, BUN 20, Potassium 4.4, Sodium 137, EGFR 39-45   Recommendations: NONE - Unable to reach patient   Follow-up plan: ICM clinic phone appointment on 08/28/2016 to recheck fluid levels.   Copy of ICM check sent to Dr Irish Lack and [name].   3 month ICM trend: 08/20/2016   1 Year ICM trend:      Rosalene Billings, RN 08/20/2016 9:31 AM

## 2016-08-20 NOTE — Telephone Encounter (Signed)
Remote ICM transmission received.  Attempted patient call and left message to return call.   

## 2016-08-21 NOTE — Progress Notes (Signed)
Attempted 2nd call to patient and line busy

## 2016-08-28 ENCOUNTER — Telehealth: Payer: Self-pay | Admitting: Cardiology

## 2016-08-28 ENCOUNTER — Ambulatory Visit (INDEPENDENT_AMBULATORY_CARE_PROVIDER_SITE_OTHER): Payer: Medicare HMO

## 2016-08-28 DIAGNOSIS — Z950 Presence of cardiac pacemaker: Secondary | ICD-10-CM

## 2016-08-28 DIAGNOSIS — I5022 Chronic systolic (congestive) heart failure: Secondary | ICD-10-CM

## 2016-08-28 NOTE — Telephone Encounter (Signed)
LMOVM reminding pt to send remote transmission.   

## 2016-08-31 ENCOUNTER — Telehealth: Payer: Self-pay

## 2016-08-31 NOTE — Telephone Encounter (Signed)
Remote ICM transmission received.  Attempted patient call and left detailed message regarding transmission and next ICM scheduled for 09/21/2016.  Advised to return call for any fluid symptoms or questions.    

## 2016-08-31 NOTE — Progress Notes (Signed)
EPIC Encounter for ICM Monitoring  Patient Name: [name] is a 83 y.o. male Date: 08/31/2016 Primary Care Physican: Wenda Low, MD Primary Cardiologist: Irish Lack Electrophysiologist: Druscilla Brownie Weight:unknown         Attempted call to patient and unable to reach.  Left detailed message regarding transmission.  Transmission reviewed.    Thoracic impedance normal but abnormal suggesting fluid accumulation from 07/15/2016 to 07/26/2016, 07/30/2016 to 08/06/2016 and 08/17/2016 to 08/24/2016.  Prescribed dosage: Furosemide 20 mg 1 tablet daily. Potassium 10 mEq 0.5 tablet (5 mg total) daily.   Labs: 07/06/2016 Creatinine 1.53, BUN 23, Potassium 3.7, Sodium 137, EGFR 40-47 06/07/2016 Creatinine 1.10, BUN 17, Potassium 3.4, Sodium 135, EGFR 60-69 01/07/2016 Creatinine 1.09, BUN 8, Potassium 3.8, Sodium 135, EGFR 59->60  01/06/2016 Creatinine 1.10, BUN 15, Potassium 4.5, Sodium 130, EGFR 58->60  11/02/2015 Creatinine 1.13, BUN 10, Potassium 4.1, Sodium 124, EGFR 56->60  10/13/2015 Creatinine 1.30, BUN 17, Potassium 4.4, Sodium 137  05/19/2015 Creatinine 1.54, BUN 20, Potassium 4.4, Sodium 137, EGFR 39-45   Recommendations: Left voice mail with ICM number and encouraged to call for fluid symptoms.    Follow-up plan: ICM clinic phone appointment on 09/21/2016.   Copy of ICM check sent to device physician.   3 month ICM trend: 08/31/2016   1 Year ICM trend:      Rosalene Billings, RN 08/31/2016 2:56 PM

## 2016-09-18 DIAGNOSIS — H01021 Squamous blepharitis right upper eyelid: Secondary | ICD-10-CM | POA: Diagnosis not present

## 2016-09-18 DIAGNOSIS — H01025 Squamous blepharitis left lower eyelid: Secondary | ICD-10-CM | POA: Diagnosis not present

## 2016-09-18 DIAGNOSIS — H401123 Primary open-angle glaucoma, left eye, severe stage: Secondary | ICD-10-CM | POA: Diagnosis not present

## 2016-09-18 DIAGNOSIS — H02831 Dermatochalasis of right upper eyelid: Secondary | ICD-10-CM | POA: Diagnosis not present

## 2016-09-18 DIAGNOSIS — H348322 Tributary (branch) retinal vein occlusion, left eye, stable: Secondary | ICD-10-CM | POA: Diagnosis not present

## 2016-09-18 DIAGNOSIS — H01024 Squamous blepharitis left upper eyelid: Secondary | ICD-10-CM | POA: Diagnosis not present

## 2016-09-18 DIAGNOSIS — H401112 Primary open-angle glaucoma, right eye, moderate stage: Secondary | ICD-10-CM | POA: Diagnosis not present

## 2016-09-18 DIAGNOSIS — H532 Diplopia: Secondary | ICD-10-CM | POA: Diagnosis not present

## 2016-09-18 DIAGNOSIS — H01022 Squamous blepharitis right lower eyelid: Secondary | ICD-10-CM | POA: Diagnosis not present

## 2016-09-21 ENCOUNTER — Telehealth: Payer: Self-pay | Admitting: Cardiology

## 2016-09-21 NOTE — Telephone Encounter (Signed)
LMOVM reminding pt to send remote transmission.   

## 2016-10-05 NOTE — Progress Notes (Signed)
No ICM remote transmission received for 09/21/2016 and next ICM transmission scheduled for 10/17/2016.

## 2016-10-11 ENCOUNTER — Encounter (INDEPENDENT_AMBULATORY_CARE_PROVIDER_SITE_OTHER): Payer: Medicare HMO | Admitting: Ophthalmology

## 2016-10-17 ENCOUNTER — Ambulatory Visit (INDEPENDENT_AMBULATORY_CARE_PROVIDER_SITE_OTHER): Payer: Medicare HMO | Admitting: *Deleted

## 2016-10-17 DIAGNOSIS — R001 Bradycardia, unspecified: Secondary | ICD-10-CM

## 2016-10-17 NOTE — Progress Notes (Signed)
Remote pacemaker transmission.   

## 2016-10-18 ENCOUNTER — Ambulatory Visit (INDEPENDENT_AMBULATORY_CARE_PROVIDER_SITE_OTHER): Payer: Medicare HMO

## 2016-10-18 ENCOUNTER — Telehealth: Payer: Self-pay

## 2016-10-18 DIAGNOSIS — I5022 Chronic systolic (congestive) heart failure: Secondary | ICD-10-CM | POA: Diagnosis not present

## 2016-10-18 DIAGNOSIS — Z950 Presence of cardiac pacemaker: Secondary | ICD-10-CM

## 2016-10-18 NOTE — Telephone Encounter (Signed)
Remote ICM transmission received.  Attempted patient call and no answer 

## 2016-10-18 NOTE — Progress Notes (Signed)
EPIC Encounter for ICM Monitoring  Patient Name: [name] is a 83 y.o. male Date: 10/18/2016 Primary Care Physican: Wenda Low, MD Primary Cardiologist: Irish Lack Electrophysiologist: Druscilla Brownie Weight:unknown        Attempted call to patient and unable to reach.   Transmission reviewed.    Thoracic impedance abnormal suggesting fluid accumulation since 10/14/2016.  Prescribed dosage: Furosemide 20 mg 1 tablet daily. Potassium 10 mEq 0.5 tablet (5 mg total) daily.   Labs: 07/06/2016 Creatinine 1.53, BUN 23, Potassium 3.7, Sodium 137, EGFR 40-47 06/07/2016 Creatinine 1.10, BUN 17, Potassium 3.4, Sodium 135, EGFR 60-69 01/07/2016 Creatinine 1.09, BUN 8, Potassium 3.8, Sodium 135, EGFR 59->60  01/06/2016 Creatinine 1.10, BUN 15, Potassium 4.5, Sodium 130, EGFR 58->60  11/02/2015 Creatinine 1.13, BUN 10, Potassium 4.1, Sodium 124, EGFR 56->60  10/13/2015 Creatinine 1.30, BUN 17, Potassium 4.4, Sodium 137       05/19/2015 Creatinine 1.54, BUN 20, Potassium 4.4, Sodium 137, EGFR 39-45   Recommendations: NONE - Unable to reach patient   Follow-up plan: ICM clinic phone appointment on 10/26/2016.  Due office visit with Dr Irish Lack in November.   Copy of ICM check sent to Dr Irish Lack and [name] for review and if any recommendations will call back.    3 month ICM trend: 10/17/2016   1 Year ICM trend:      Rosalene Billings, RN 10/18/2016 9:02 AM

## 2016-10-24 LAB — CUP PACEART REMOTE DEVICE CHECK
Battery Remaining Percentage: 95.5 %
Battery Voltage: 2.99 V
Date Time Interrogation Session: 20180822081625
Implantable Lead Implant Date: 20161205
Implantable Lead Location: 753858
Implantable Lead Location: 753860
Implantable Pulse Generator Implant Date: 20161205
Lead Channel Impedance Value: 1050 Ohm
Lead Channel Impedance Value: 430 Ohm
Lead Channel Pacing Threshold Amplitude: 0.75 V
Lead Channel Pacing Threshold Amplitude: 1 V
Lead Channel Pacing Threshold Pulse Width: 0.5 ms
Lead Channel Pacing Threshold Pulse Width: 0.6 ms
Lead Channel Sensing Intrinsic Amplitude: 9.2 mV
Lead Channel Setting Pacing Amplitude: 2.5 V
Lead Channel Setting Pacing Pulse Width: 0.5 ms
Lead Channel Setting Pacing Pulse Width: 0.6 ms
Lead Channel Setting Sensing Sensitivity: 2 mV
MDC IDC LEAD IMPLANT DT: 20161205
MDC IDC LEAD IMPLANT DT: 20161205
MDC IDC LEAD LOCATION: 753859
MDC IDC MSMT BATTERY REMAINING LONGEVITY: 100 mo
MDC IDC PG SERIAL: 7802901
MDC IDC SET LEADCHNL LV PACING AMPLITUDE: 2 V

## 2016-10-26 ENCOUNTER — Encounter: Payer: Self-pay | Admitting: Cardiology

## 2016-10-26 ENCOUNTER — Ambulatory Visit (INDEPENDENT_AMBULATORY_CARE_PROVIDER_SITE_OTHER): Payer: Self-pay

## 2016-10-26 DIAGNOSIS — Z950 Presence of cardiac pacemaker: Secondary | ICD-10-CM

## 2016-10-26 DIAGNOSIS — I5022 Chronic systolic (congestive) heart failure: Secondary | ICD-10-CM

## 2016-10-26 NOTE — Progress Notes (Signed)
EPIC Encounter for ICM Monitoring  Patient Name: [name] is a 83 y.o. male Date: 10/26/2016 Primary Care Physican: Wenda Low, MD Primary Cardiologist: Irish Lack Electrophysiologist: Druscilla Brownie Weight:unknown                                                           Heart Failure questions reviewed, pt asymptomatic now but could tell he had some fluid last week.  He is currently in [place] and will be there for about another month.    Thoracic impedance returned to normal since last ICM transmission.  Patient said he only took 1/2 Furosemide tablet during time of decreased impedance.  He returned to taking a whole table this week which correlates with transmission  Prescribed dosage: Furosemide 20 mg 1 tablet daily. Potassium 10 mEq 0.5 tablet (5 mg total) daily.   Labs: 07/06/2016 Creatinine 1.53, BUN 23, Potassium 3.7, Sodium 137, EGFR 40-47 06/07/2016 Creatinine 1.10, BUN 17, Potassium 3.4, Sodium 135, EGFR 60-69 01/07/2016 Creatinine 1.09, BUN 8, Potassium 3.8, Sodium 135, EGFR 59->60  01/06/2016 Creatinine 1.10, BUN 15, Potassium 4.5, Sodium 130, EGFR 58->60  11/02/2015 Creatinine 1.13, BUN 10, Potassium 4.1, Sodium 124, EGFR 56->60  10/13/2015 Creatinine 1.30, BUN 17, Potassium 4.4, Sodium 137       05/19/2015 Creatinine 1.54, BUN 20, Potassium 4.4, Sodium 137, EGFR 39-45   Recommendations: No changes.   Encouraged to call for fluid symptoms.  Follow-up plan: ICM clinic phone appointment on 12/06/2016.  Due to make appointment with Dr Irish Lack in November.   Copy of ICM check sent to Dr. Lovena Le.   3 month ICM trend: 10/26/2016   1 Year ICM trend:      Rosalene Billings, RN 10/26/2016 10:08 AM

## 2016-11-01 ENCOUNTER — Other Ambulatory Visit: Payer: Self-pay | Admitting: Interventional Cardiology

## 2016-11-12 ENCOUNTER — Other Ambulatory Visit: Payer: Self-pay

## 2016-11-12 MED ORDER — CARVEDILOL 3.125 MG PO TABS: 3.1250 mg | ORAL_TABLET | Freq: Two times a day (BID) | ORAL | 1 refills | Status: DC

## 2016-12-06 ENCOUNTER — Ambulatory Visit (INDEPENDENT_AMBULATORY_CARE_PROVIDER_SITE_OTHER): Payer: Medicare HMO

## 2016-12-06 DIAGNOSIS — Z950 Presence of cardiac pacemaker: Secondary | ICD-10-CM

## 2016-12-06 DIAGNOSIS — I5022 Chronic systolic (congestive) heart failure: Secondary | ICD-10-CM | POA: Diagnosis not present

## 2016-12-06 NOTE — Progress Notes (Signed)
EPIC Encounter for ICM Monitoring  Patient Name: [name] is a 83 y.o. male Date: 12/06/2016 Primary Care Physican: Wenda Low, MD Primary Cardiologist: Irish Lack Electrophysiologist: Druscilla Brownie Weight:not weighing       Heart Failure questions reviewed, pt asymptomatic.  Currently traveling between [place] and [place].    Thoracic impedance normal but was abnormal suggesting fluid accumulation from 11/21/2016 to 12/03/2016.  Prescribed dosage: Furosemide 20 mg 1 tablet daily. Potassium 10 mEq take 1 tablet every day as needed when taking Furosemide.   Labs: 07/06/2016 Creatinine 1.53, BUN 23, Potassium 3.7, Sodium 137, EGFR 40-47 06/07/2016 Creatinine 1.10, BUN 17, Potassium 3.4, Sodium 135, EGFR 60-69 01/07/2016 Creatinine 1.09, BUN 8, Potassium 3.8, Sodium 135, EGFR 59->60  01/06/2016 Creatinine 1.10, BUN 15, Potassium 4.5, Sodium 130, EGFR 58->60  11/02/2015 Creatinine 1.13, BUN 10, Potassium 4.1, Sodium 124, EGFR 56->60  10/13/2015 Creatinine 1.30, BUN 17, Potassium 4.4, Sodium 137       05/19/2015 Creatinine 1.54, BUN 20, Potassium 4.4, Sodium 137, EGFR 39-45   Recommendations: No changes.   Encouraged to call for fluid symptoms.  Follow-up plan: ICM clinic phone appointment on 01/07/2017.  Office appointment scheduled 12/31/2016 with Dr. Irish Lack.  Copy of ICM check sent to Dr. Lovena Le.   3 month ICM trend: 12/06/2016   1 Year ICM trend:      Rosalene Billings, RN 12/06/2016 11:26 AM

## 2016-12-11 ENCOUNTER — Encounter (INDEPENDENT_AMBULATORY_CARE_PROVIDER_SITE_OTHER): Payer: Self-pay | Admitting: Ophthalmology

## 2016-12-25 ENCOUNTER — Telehealth: Payer: Self-pay | Admitting: Internal Medicine

## 2016-12-25 NOTE — Telephone Encounter (Signed)
Per pt cal

## 2016-12-26 ENCOUNTER — Encounter (INDEPENDENT_AMBULATORY_CARE_PROVIDER_SITE_OTHER): Payer: Medicare HMO | Admitting: Ophthalmology

## 2016-12-26 DIAGNOSIS — H35033 Hypertensive retinopathy, bilateral: Secondary | ICD-10-CM | POA: Diagnosis not present

## 2016-12-26 DIAGNOSIS — I10 Essential (primary) hypertension: Secondary | ICD-10-CM

## 2016-12-26 DIAGNOSIS — H353132 Nonexudative age-related macular degeneration, bilateral, intermediate dry stage: Secondary | ICD-10-CM

## 2016-12-26 DIAGNOSIS — H43813 Vitreous degeneration, bilateral: Secondary | ICD-10-CM

## 2016-12-26 DIAGNOSIS — H348322 Tributary (branch) retinal vein occlusion, left eye, stable: Secondary | ICD-10-CM | POA: Diagnosis not present

## 2016-12-27 NOTE — Telephone Encounter (Signed)
New Message    Pt c/o BP issue: STAT if pt c/o blurred vision, one-sided weakness or slurred speech  1. What are your last 5 BP readings? 122/68 hr 46  2. Are you having any other symptoms (ex. Dizziness, headache, blurred vision, passed out)?  Dizziness   3. What is your BP issue? Feeling dizzy

## 2016-12-27 NOTE — Telephone Encounter (Signed)
Returned call to [facility], the patient's daughter in law. The patient gives verbal permission to speak to her. She states that the patient was feeling weak and dizzy this morning when he first woke up. She states that his BP was low at 97/40 and HR 44. She states that she rechecked his BP a little later and it was 122/68 HR 46. She states that the patient used to have a nurse that stayed with him and helped him with his medicines, but he does not have that anymore. She states that she lives right beside of him and that she will be helping him from now on. She is unsure of what medicines he has been taking or if he has been taking the carvedilol or not. The patient is talking in the background and states that he is not sure if he is taking carvedilol or not but he knows he is taking his plavix. [name] states that the patient did not eat this morning. Patient's BP now is 107/78 HR 77. Patient is in the background yelling that he feels better now and doesn't need anything. Instructed for the patient to eat, stay hydrated and change positions slowly. Patient already has appointment with Dr. Irish Lack on 11/5. [name] states that she will be coming to the appointment with him. Advised for the patient to keep appointment and bring his medicines with him to the appointment. Instructed for the patient to let us know if his symptoms worsen before then. Cindy verbalized understanding and thanked me for the call.

## 2016-12-30 NOTE — Progress Notes (Signed)
Cardiology Office Note   Date:  12/31/2016   ID:  [name], DOB [birth year], MRN [id number]  PCP:  Wenda Low, MD    No chief complaint on file. CAD   Wt Readings from Last 3 Encounters:  12/31/16 178 lb 12.8 oz (81.1 kg)  07/18/16 190 lb (86.2 kg)  06/07/16 192 lb (87.1 kg)       History of Present Illness: [name] is a 83 y.o. male  with prior AFib, CAD and mitral regurgitation. He most recently had a complex PCI of his LAD in July 2015 involving rotational atherectomy. He had a hematoma in his right wrist at the time of the diagnostic cath. He had a right groin hematoma after the intervention. Prior to the intervention, his ejection fraction was in the 30-35% range. Echocardiogram about a month after the intervention showed an improvement in his EF from 50-55%. He continued to have some fatigue post procedure. He has had issues with his prostate and difficulty sleeping.  He then developed tachybrady syndrome and had a BiV - pacer placed. It took  He was found to have AFib.  He had GI bleeding with Warfarin.  He did not tolerate Eliquis.  Amio was started to maintain NSR, but he reverted to AFib even after DCCV.  Most recently, in 9/17, Amio and Warfarin were stopped and Plavix was restarted since he had taken this without bleeding issues in the past.  He reports a feeling like his diaphragm is pacing.   Denies : Chest pain. Dizziness. Nitroglycerin use. Orthopnea. Palpitations. Paroxysmal nocturnal dyspnea. Shortness of breath. Syncope.   He does some walking.  He uses a walker.  He is going back to [place] during the winter time.  Rare right ankle edema.  Had one fall when he did not use the walker.  No injuries.    He wants to have a cardiologist in [place] as well just in case.         Past Medical History:  Diagnosis Date  . Anxiety   . Arthritis   . Atrial fibrillation, permanent ([place]) 01/07/2016  . Blood transfusion    "related to a  surgery"  . BPH (benign prostatic hypertrophy) with urinary obstruction    s/p turp yrs ago  . Bradycardia    a. Amio d/c'd 08/2013; brady arrest 08/2013 after PCI >>> recurrent AF >>> Amiodarone restarted. b. Pacemaker being considered in 11/2014.  [name] CAD (coronary artery disease)    a. s/p MI and prior PCI of LAD;  b. LHC (08/2013):  prox LAD 60-70%, mid LAD stents ok, ostial lesion at Dx jailed by stent, mild CFX and RCA disesase >>>  PCI (09/08/13):  rotational atherectomy + Promus DES to prox LAD  . Cardiomyopathy ([place])    a. Echo (08/2013):  EF 30-35%, AS hypokinesis, Gr 1 diast dysfn, mild MR, mild LAE >>> b. improved EF 50-55% by echo 8/15. c. EF down again by echo 12/2014 to 30-35% but 51% by nuc.  [name] Chronic lower back pain   . Chronic systolic CHF (congestive heart failure) ([place])   . CKD (chronic kidney disease), stage III ([place])    a. Per review of labs baseline Cr 1.1-1.3.  [name] DDD (degenerative disc disease)    chronic back pain  . Depression   . Diverticulitis of colon with bleeding    s/p sigmoid resection '88  . DJD (degenerative joint disease) hips and knees   s/p bilateral total replacements  .  GERD (gastroesophageal reflux disease)    occ. take prevacid  . History of GI diverticular bleed april 2012   transfused blood and resolved without surgical intervention  . Hypertension   . Impaired hearing bilateral    only left hearing aid  . Impotence, organic    s/p penile prosthesis 1990's  . Incomplete bladder emptying   . LBBB (left bundle branch block)   . Meningioma ([place]) right -sided w/ right VI palsy   followed by dr Gaynell Face  . Mitral regurgitation    a. Mild-mod by echo 12/2014.  [name] Myocardial infarction [facility]) 1980's- medical intervention   "so mild I didn't know I'd had it"  . PAF (paroxysmal atrial fibrillation) ([facility])    not on coumadin due to hx of GI bleed.  . Prostate cancer ([place]) 11/30/13   Gleason 8, volume 22.14 cc  . Sleep apnea    non-compliant cpap     Past Surgical History:  Procedure Laterality Date  . APPENDECTOMY    . CARDIAC CATHETERIZATION  2007   noncritical cad (results w/ chart)  . CATARACT EXTRACTION W/ INTRAOCULAR LENS  IMPLANT, BILATERAL Bilateral ~ 2000  . CHOLECYSTECTOMY    . CLOSED REDUCTION HIP DISLOCATION Right "several"  . CORONARY ANGIOPLASTY WITH STENT PLACEMENT  08-03-08   drug-eluting stent x2 distal and mid lad  . gamma knife radiation  2000   [facility] for meningioma, last eval 2013- no change  . INGUINAL HERNIA REPAIR Bilateral   . INNER EAR SURGERY Right yrs ago   "trying to get my hearing back  . PENILE PROSTHESIS IMPLANT  1990's  . PROSTATE BIOPSY  11/30/13   Gleason 8, vol 22.14 cc  . REVISION TOTAL KNEE ARTHROPLASTY Right   . SHOULDER OPEN ROTATOR CUFF REPAIR Left   . TOTAL HIP ARTHROPLASTY Right 03-25-08--  . TOTAL HIP ARTHROPLASTY Left 2005  . TOTAL HIP REVISION Right 3-4 times  . TOTAL KNEE ARTHROPLASTY Right 2004  . TOTAL KNEE ARTHROPLASTY Left 1997  . TRANSURETHRAL RESECTION OF PROSTATE  "years ago"     Current Outpatient Medications  Medication Sig Dispense Refill  . acetaminophen (TYLENOL) 500 MG tablet Take 1,000 mg by mouth every 6 (six) hours as needed for headache (pain).     . brimonidine (ALPHAGAN) 0.15 % ophthalmic solution Place 1 drop into both eyes 2 (two) times daily.    . carvedilol (COREG) 3.125 MG tablet Take 1 tablet (3.125 mg total) by mouth 2 (two) times daily. 180 tablet 1  . clopidogrel (PLAVIX) 75 MG tablet TAKE 1 TABLET EVERY DAY 90 tablet 2  . diazepam (VALIUM) 5 MG tablet Take 1 tablet (5 mg total) by mouth every 12 (twelve) hours as needed for anxiety. 5 tablet 0  . dorzolamide-timolol (COSOPT) 22.3-6.8 MG/ML ophthalmic solution Place 1 drop into both eyes 2 (two) times daily.   2  . finasteride (PROSCAR) 5 MG tablet Take 1 tablet (5 mg total) by mouth daily. 30 tablet 6  . Flaxseed, Linseed, (FLAX SEEDS PO) Take 15 mLs by mouth daily. Grind up seeds and take with  honey    . fluticasone (FLONASE) 50 MCG/ACT nasal spray Place 2 sprays into both nostrils daily as needed for allergies.   1  . Multiple Vitamins-Minerals (EMERGEN-C VITAMIN C) PACK Take 1,000 mg by mouth daily after lunch.    . Multiple Vitamins-Minerals (PRESERVISION AREDS 2) CAPS Take 1 capsule by mouth daily after lunch.    . nitroGLYCERIN (NITROSTAT) 0.4 MG SL tablet Place  1 tablet (0.4 mg total) under the tongue every 5 (five) minutes as needed for chest pain. 20 tablet 0  . Omega-3 Fatty Acids (FISH OIL PO) Take 1 capsule by mouth daily after lunch.    . potassium chloride (K-DUR) 10 MEQ tablet Take 5 mEq by mouth daily. 07/03/16 Pt reported taking 1 tablet (10 mEq) daily.    . potassium chloride (K-DUR,KLOR-CON) 10 MEQ tablet TAKE 1 TABLET EVERY DAY AS NEEDED. WHEN YOU TAKE YOUR FUROSEMIDE. 90 tablet 2  . Probiotic Product (PROBIOTIC PO) Take 1 tablet by mouth 2 (two) times daily.     . psyllium (METAMUCIL) 58.6 % packet Take 1 packet by mouth daily with supper. Mix in 8 oz liquid and drink    . tamsulosin (FLOMAX) 0.4 MG CAPS capsule Take 1 capsule (0.4 mg total) by mouth daily after supper. 30 capsule 6  . torsemide (DEMADEX) 20 MG tablet Take 1 tablet by mouth daily.    . traMADol (ULTRAM) 50 MG tablet Take 50 mg as needed by mouth.     No current facility-administered medications for this visit.     Allergies:   Omeprazole magnesium; Pantoprazole sodium; Codeine; Omeprazole; and Warfarin sodium    Social History:  The patient  reports that he quit smoking about 58 years ago. His smoking use included cigarettes. He has a 14.00 pack-year smoking history. he has never used smokeless tobacco. He reports that he drinks alcohol. He reports that he does not use drugs.   Family History:  The patient's family history includes Cancer in his brother and unknown relative; Emphysema in his father; Heart attack in his mother; Heart disease in his mother.    ROS:  Please see the history of  present illness.   Otherwise, review of systems are positive for rare fall without walker.   All other systems are reviewed and negative.    PHYSICAL EXAM: VS:  BP (!) 112/56   Pulse 68   Ht 5\' 7"  (1.702 m)   Wt 178 lb 12.8 oz (81.1 kg)   SpO2 97%   BMI 28.00 kg/m  , BMI Body mass index is 28 kg/m. GEN: Well nourished, well developed, in no acute distress  HEENT: normal  Neck: no JVD, carotid bruits, or masses Cardiac: RRR; no murmurs, rubs, or gallops,no edema  Respiratory:  clear to auscultation bilaterally, normal work of breathing GI: soft, nontender, nondistended, + BS MS: no deformity or atrophy  Skin: warm and dry, no rash Neuro:  Strength and sensation are intact Psych: euthymic mood, full affect   EKG:   The ekg ordered in 4/18 demonstrates NSR, PVCs   Recent Labs: 01/06/2016: ALT 16; B Natriuretic Peptide 294.1 05/28/2016: Hemoglobin 12.7; Platelets 135; TSH 2.680 07/06/2016: BUN 23; Creatinine, Ser 1.53; Potassium 3.7; Sodium 137   Lipid Panel No results found for: CHOL, TRIG, HDL, CHOLHDL, VLDL, LDLCALC, LDLDIRECT   Other studies Reviewed: Additional studies/ records that were reviewed today with results demonstrating: LVEF 30-35%, moderate MR.   ASSESSMENT AND PLAN:  1. CAD: No angina on current medical therapy.  Continue current medications.  I did ask him to go back to a full tablet of Plavix a day. 2. AFib/flutter: Has not tolerated anticoagulation in the past due to GI bleeding.  Continue Plavix.  Appears to be in sinus rhythm today. 3. Mitral regurg: moderate in 11/16.  No sign of CHF despite chronic systolic heart failure.  He appears euvolemic.  He has adjusted his own diuretics as  needed.  He can continue to do this. 4. HTN: Has low BP in the past.  Of late, blood pressure well controlled.  Continue current medications. 5. Pacer: I informed the pacemaker clinic regarding his sensation of diaphragmatic stimulation. 6. Prostate cancer.  He requests PSA  to be checked with todays lipids.  He did eat very early this AM but it is difficult for him to come fasting.   Current medicines are reviewed at length with the patient today.  The patient concerns regarding his medicines were addressed.  The following changes have been made:  No change  Labs/ tests ordered today include:   Orders Placed This Encounter  Procedures  . Lipid panel  . PSA    Recommend 150 minutes/week of aerobic exercise Low fat, low carb, high fiber diet recommended  Disposition:   FU in 1 year   Signed, Larae Grooms, MD  12/31/2016 9:57 AM    [facility] [address], [place], [place]  [address] Phone: [phone]; Fax: [phone]

## 2016-12-31 ENCOUNTER — Ambulatory Visit (INDEPENDENT_AMBULATORY_CARE_PROVIDER_SITE_OTHER): Payer: Self-pay | Admitting: *Deleted

## 2016-12-31 ENCOUNTER — Ambulatory Visit (INDEPENDENT_AMBULATORY_CARE_PROVIDER_SITE_OTHER): Payer: Medicare HMO | Admitting: Interventional Cardiology

## 2016-12-31 ENCOUNTER — Encounter: Payer: Self-pay | Admitting: Interventional Cardiology

## 2016-12-31 VITALS — BP 112/56 | HR 68 | Ht 67.0 in | Wt 178.8 lb

## 2016-12-31 DIAGNOSIS — I4821 Permanent atrial fibrillation: Secondary | ICD-10-CM

## 2016-12-31 DIAGNOSIS — E785 Hyperlipidemia, unspecified: Secondary | ICD-10-CM | POA: Diagnosis not present

## 2016-12-31 DIAGNOSIS — I059 Rheumatic mitral valve disease, unspecified: Secondary | ICD-10-CM

## 2016-12-31 DIAGNOSIS — I25118 Atherosclerotic heart disease of native coronary artery with other forms of angina pectoris: Secondary | ICD-10-CM | POA: Diagnosis not present

## 2016-12-31 DIAGNOSIS — I5022 Chronic systolic (congestive) heart failure: Secondary | ICD-10-CM | POA: Diagnosis not present

## 2016-12-31 DIAGNOSIS — I482 Chronic atrial fibrillation: Secondary | ICD-10-CM

## 2016-12-31 DIAGNOSIS — I2510 Atherosclerotic heart disease of native coronary artery without angina pectoris: Secondary | ICD-10-CM | POA: Diagnosis not present

## 2016-12-31 DIAGNOSIS — C61 Malignant neoplasm of prostate: Secondary | ICD-10-CM | POA: Diagnosis not present

## 2016-12-31 DIAGNOSIS — Z8719 Personal history of other diseases of the digestive system: Secondary | ICD-10-CM | POA: Diagnosis not present

## 2016-12-31 LAB — CUP PACEART INCLINIC DEVICE CHECK
Battery Voltage: 2.98 V
Date Time Interrogation Session: 20181105103109
Implantable Lead Implant Date: 20161205
Implantable Lead Implant Date: 20161205
Implantable Lead Implant Date: 20161205
Implantable Lead Location: 753858
Implantable Pulse Generator Implant Date: 20161205
Lead Channel Impedance Value: 425 Ohm
Lead Channel Pacing Threshold Amplitude: 0.75 V
Lead Channel Pacing Threshold Amplitude: 0.75 V
Lead Channel Pacing Threshold Amplitude: 0.75 V
Lead Channel Pacing Threshold Pulse Width: 0.6 ms
Lead Channel Sensing Intrinsic Amplitude: 2 mV
Lead Channel Setting Pacing Amplitude: 2 V
Lead Channel Setting Pacing Pulse Width: 0.5 ms
Lead Channel Setting Sensing Sensitivity: 2 mV
MDC IDC LEAD LOCATION: 753859
MDC IDC LEAD LOCATION: 753860
MDC IDC MSMT BATTERY REMAINING LONGEVITY: 102 mo
MDC IDC MSMT LEADCHNL LV IMPEDANCE VALUE: 1075 Ohm
MDC IDC MSMT LEADCHNL LV PACING THRESHOLD PULSEWIDTH: 0.6 ms
MDC IDC MSMT LEADCHNL RV PACING THRESHOLD AMPLITUDE: 0.75 V
MDC IDC MSMT LEADCHNL RV PACING THRESHOLD PULSEWIDTH: 0.5 ms
MDC IDC MSMT LEADCHNL RV PACING THRESHOLD PULSEWIDTH: 0.5 ms
MDC IDC MSMT LEADCHNL RV SENSING INTR AMPL: 12 mV
MDC IDC SET LEADCHNL LV PACING PULSEWIDTH: 0.6 ms
MDC IDC SET LEADCHNL RV PACING AMPLITUDE: 2.5 V
MDC IDC STAT BRADY RA PERCENT PACED: 0 %
MDC IDC STAT BRADY RV PERCENT PACED: 62 %
Pulse Gen Model: 3262
Pulse Gen Serial Number: 7802901

## 2016-12-31 LAB — PSA

## 2016-12-31 LAB — LIPID PANEL
CHOL/HDL RATIO: 4.5 ratio (ref 0.0–5.0)
Cholesterol, Total: 157 mg/dL (ref 100–199)
HDL: 35 mg/dL — AB (ref >39)
LDL CALC: 93 mg/dL (ref 0–99)
Triglycerides: 145 mg/dL (ref 0–149)
VLDL CHOLESTEROL CAL: 29 mg/dL (ref 5–40)

## 2016-12-31 NOTE — Patient Instructions (Signed)
Medication Instructions:  Your physician recommends that you continue on your current medications as directed. Please refer to the Current Medication list given to you today.   Labwork: LABS TODAY: LIPIDS and PSA  Testing/Procedures: None ordered  Follow-Up: Your physician wants you to follow-up in: 1 year with Dr. Irish Lack. You will receive a reminder letter in the mail two months in advance. If you don't receive a letter, please call our office to schedule the follow-up appointment.   Any Other Special Instructions Will Be Listed Below (If Applicable).     If you need a refill on your cardiac medications before your next appointment, please call your pharmacy.

## 2016-12-31 NOTE — Progress Notes (Signed)
Add on CRTP check in clinic. [name] c/o PPM not acting right. 62% Bp. He reports that his HR is dipping into the 40s. He is reading his HR on an electric BP monitor. He has permanent AF, irregular HR, lower pacing rate set to 75bpm. I explained what the systolic and diastolic readings meant and that his HR would not be reliably read by an electric monitor. I demonstrated checking a radial pulse for him. He will be traveling to [facility] for 4 months, he is planning to take his [place]lin monitor with him.

## 2017-01-02 DIAGNOSIS — H348322 Tributary (branch) retinal vein occlusion, left eye, stable: Secondary | ICD-10-CM | POA: Diagnosis not present

## 2017-01-02 DIAGNOSIS — H532 Diplopia: Secondary | ICD-10-CM | POA: Diagnosis not present

## 2017-01-02 DIAGNOSIS — H01021 Squamous blepharitis right upper eyelid: Secondary | ICD-10-CM | POA: Diagnosis not present

## 2017-01-02 DIAGNOSIS — H01024 Squamous blepharitis left upper eyelid: Secondary | ICD-10-CM | POA: Diagnosis not present

## 2017-01-02 DIAGNOSIS — H401112 Primary open-angle glaucoma, right eye, moderate stage: Secondary | ICD-10-CM | POA: Diagnosis not present

## 2017-01-02 DIAGNOSIS — H01025 Squamous blepharitis left lower eyelid: Secondary | ICD-10-CM | POA: Diagnosis not present

## 2017-01-02 DIAGNOSIS — H02831 Dermatochalasis of right upper eyelid: Secondary | ICD-10-CM | POA: Diagnosis not present

## 2017-01-02 DIAGNOSIS — H01022 Squamous blepharitis right lower eyelid: Secondary | ICD-10-CM | POA: Diagnosis not present

## 2017-01-02 DIAGNOSIS — H401123 Primary open-angle glaucoma, left eye, severe stage: Secondary | ICD-10-CM | POA: Diagnosis not present

## 2017-01-03 ENCOUNTER — Telehealth: Payer: Self-pay | Admitting: Interventional Cardiology

## 2017-01-03 NOTE — Telephone Encounter (Signed)
-----   Message from Jettie Booze, MD sent at 01/01/2017  4:53 PM EST ----- Lipids well  Controlled.  PSA low.  Please cc: PMD.

## 2017-01-03 NOTE — Telephone Encounter (Signed)
Patient made aware of results. Patient verbalizes understanding. Forwarded to PCP Dr. Harrington Challenger per patient's request.

## 2017-01-03 NOTE — Telephone Encounter (Signed)
Follow up  ° ° ° °Pt is returning call for lab results  °

## 2017-01-07 ENCOUNTER — Telehealth: Payer: Self-pay | Admitting: Cardiology

## 2017-01-07 NOTE — Telephone Encounter (Signed)
LMOVM reminding pt to send remote transmission.   

## 2017-01-21 NOTE — Progress Notes (Signed)
No ICM remote transmission received for 01/07/2017 and next ICM transmission scheduled for 02/07/2017.

## 2017-02-07 ENCOUNTER — Ambulatory Visit (INDEPENDENT_AMBULATORY_CARE_PROVIDER_SITE_OTHER): Payer: Medicare HMO | Admitting: *Deleted

## 2017-02-07 DIAGNOSIS — R001 Bradycardia, unspecified: Secondary | ICD-10-CM

## 2017-02-07 DIAGNOSIS — Z950 Presence of cardiac pacemaker: Secondary | ICD-10-CM | POA: Diagnosis not present

## 2017-02-07 DIAGNOSIS — I5022 Chronic systolic (congestive) heart failure: Secondary | ICD-10-CM

## 2017-02-07 NOTE — Progress Notes (Signed)
Remote pacemaker transmission.   

## 2017-02-08 DIAGNOSIS — F419 Anxiety disorder, unspecified: Secondary | ICD-10-CM | POA: Diagnosis not present

## 2017-02-08 DIAGNOSIS — Z8546 Personal history of malignant neoplasm of prostate: Secondary | ICD-10-CM | POA: Diagnosis not present

## 2017-02-08 DIAGNOSIS — E7800 Pure hypercholesterolemia, unspecified: Secondary | ICD-10-CM | POA: Diagnosis not present

## 2017-02-08 DIAGNOSIS — M419 Scoliosis, unspecified: Secondary | ICD-10-CM | POA: Diagnosis not present

## 2017-02-08 DIAGNOSIS — Z23 Encounter for immunization: Secondary | ICD-10-CM | POA: Diagnosis not present

## 2017-02-08 DIAGNOSIS — D329 Benign neoplasm of meninges, unspecified: Secondary | ICD-10-CM | POA: Diagnosis not present

## 2017-02-08 DIAGNOSIS — Z0000 Encounter for general adult medical examination without abnormal findings: Secondary | ICD-10-CM | POA: Diagnosis not present

## 2017-02-08 DIAGNOSIS — I4891 Unspecified atrial fibrillation: Secondary | ICD-10-CM | POA: Diagnosis not present

## 2017-02-08 DIAGNOSIS — I10 Essential (primary) hypertension: Secondary | ICD-10-CM | POA: Diagnosis not present

## 2017-02-09 DIAGNOSIS — D0422 Carcinoma in situ of skin of left ear and external auricular canal: Secondary | ICD-10-CM | POA: Diagnosis not present

## 2017-02-09 DIAGNOSIS — L821 Other seborrheic keratosis: Secondary | ICD-10-CM | POA: Diagnosis not present

## 2017-02-09 DIAGNOSIS — L570 Actinic keratosis: Secondary | ICD-10-CM | POA: Diagnosis not present

## 2017-02-09 DIAGNOSIS — Z85828 Personal history of other malignant neoplasm of skin: Secondary | ICD-10-CM | POA: Diagnosis not present

## 2017-02-09 DIAGNOSIS — D692 Other nonthrombocytopenic purpura: Secondary | ICD-10-CM | POA: Diagnosis not present

## 2017-02-12 ENCOUNTER — Encounter: Payer: Self-pay | Admitting: Cardiology

## 2017-02-12 NOTE — Progress Notes (Signed)
EPIC Encounter for ICM Monitoring  Patient Name: [name] is a 82 y.o. male Date: 02/12/2017 Primary Care Physican: Wenda Low, MD Primary Cardiologist: Irish Lack Electrophysiologist: Druscilla Brownie Weight:not weighing           Transmission received.   Thoracic impedance normal.  Prescribed dosage: Torsemide 20 mg 1 tablet daily. Potassium 10 mEq take 1 tablet every day as needed when taking Furosemide.   Labs: 07/06/2016 Creatinine 1.53, BUN 23, Potassium 3.7, Sodium 137, EGFR 40-47 06/07/2016 Creatinine 1.10, BUN 17, Potassium 3.4, Sodium 135, EGFR 60-69 01/07/2016 Creatinine 1.09, BUN 8, Potassium 3.8, Sodium 135, EGFR 59->60  01/06/2016 Creatinine 1.10, BUN 15, Potassium 4.5, Sodium 130, EGFR 58->60  11/02/2015 Creatinine 1.13, BUN 10, Potassium 4.1, Sodium 124, EGFR 56->60  10/13/2015 Creatinine 1.30, BUN 17, Potassium 4.4, Sodium 137       05/19/2015 Creatinine 1.54, BUN 20, Potassium 4.4, Sodium 137, EGFR 39-45   Recommendations: None.  Follow-up plan: ICM clinic phone appointment on 03/12/2017.    Copy of ICM check sent to Dr. Lovena Le.   3 month ICM trend: 02/09/2017    1 Year ICM trend:       Rosalene Billings, RN 02/12/2017 10:24 AM

## 2017-03-04 ENCOUNTER — Telehealth: Payer: Self-pay | Admitting: Internal Medicine

## 2017-03-04 NOTE — Telephone Encounter (Signed)
New message  Patient calling with concerns of headache. Patient states he also feels  Like a "wire" is kicking inside his stomach.  Please call   Pt c/o medication issue:  1. Name of Medication: clopidogrel (PLAVIX) 75 MG tablet  2. How are you currently taking this medication (dosage and times per day)? As prescribed  3. Are you having a reaction (difficulty breathing--STAT)? No  4. What is your medication issue? Patient states medication is giving him a headache

## 2017-03-04 NOTE — Telephone Encounter (Signed)
Patient calling and states that he has had a headache for the past 3-4 weeks and he thinks that it is related to his Plavix. Patient states that in November we increased his plavix to a full tablet daily and he wasn't sure if that was the reason he is having HAs. Made patient aware that HA is not typically a side effect of plavix. Patient states that his BP has been good and was 105/76 HR 85 today. Patient was taking 1/2 dose of plavix in the past d/t GI Bleed. Patient denies having any blood in his urine or stool or any other signs and symptoms of bleeding. He states that today is the first day that his head has not hurt and he only took 1/2 tablet of his plavix. Patient denies any additional symptoms. Patient also states that he felt like his HA could be related to stress or his sinuses. Patient wanting to know if he can go back to taking 1/2 tablet of plavix daily. Made patient aware that taking his plavix as directed in order to prevent occlusion of his stent. Discussed with Dr. Irish Lack who instructed the patient to continue taking plavix 75 mg daily and he can divide the dose if he wants and take a 1/2 tablet BID. Patient verbalized understanding and was in agreement.  Patient also complaining of a "wire kicking the inside of his stomach". He states that this is on the left side and wanted to know if it was his pacemaker. Made the patient aware that it could be diaphragmatic pacing and that I would forward this information to the device clinic and Dr. Tanna Furry RN for further investigation. Patient verbalized understanding and thanked me for the call.

## 2017-03-04 NOTE — Telephone Encounter (Signed)
Spoke with patient who reported that when he sits in a certain position he notices the thumping on the left side of his abdomen. He states that when he repositions symptoms subside. I offered to set up an appointment to attempt additional reprogramming. Patient declines stating he will work on repositioning when he feels the sensation and that it is more of a nuisance than a problem.

## 2017-03-08 LAB — CUP PACEART REMOTE DEVICE CHECK
Battery Remaining Longevity: 100 mo
Battery Remaining Percentage: 95.5 %
Implantable Lead Implant Date: 20161205
Implantable Lead Implant Date: 20161205
Implantable Lead Location: 753858
Lead Channel Impedance Value: 1050 Ohm
Lead Channel Pacing Threshold Amplitude: 0.75 V
Lead Channel Pacing Threshold Pulse Width: 0.6 ms
Lead Channel Setting Pacing Pulse Width: 0.6 ms
Lead Channel Setting Sensing Sensitivity: 2 mV
MDC IDC LEAD IMPLANT DT: 20161205
MDC IDC LEAD LOCATION: 753859
MDC IDC LEAD LOCATION: 753860
MDC IDC MSMT BATTERY VOLTAGE: 2.98 V
MDC IDC MSMT LEADCHNL LV PACING THRESHOLD AMPLITUDE: 0.75 V
MDC IDC MSMT LEADCHNL RV IMPEDANCE VALUE: 450 Ohm
MDC IDC MSMT LEADCHNL RV PACING THRESHOLD PULSEWIDTH: 0.5 ms
MDC IDC MSMT LEADCHNL RV SENSING INTR AMPL: 12 mV
MDC IDC PG IMPLANT DT: 20161205
MDC IDC SESS DTM: 20181213070549
MDC IDC SET LEADCHNL LV PACING AMPLITUDE: 2 V
MDC IDC SET LEADCHNL RV PACING AMPLITUDE: 2.5 V
MDC IDC SET LEADCHNL RV PACING PULSEWIDTH: 0.5 ms
Pulse Gen Model: 3262
Pulse Gen Serial Number: 7802901

## 2017-03-12 ENCOUNTER — Telehealth: Payer: Self-pay | Admitting: Cardiology

## 2017-03-12 NOTE — Telephone Encounter (Signed)
Spoke with pt and reminded pt of remote transmission that is due today. Pt verbalized understanding and stated that he is not near his monitor b/c his home monitor is in [facility]. He will be back in [place] in approximately 10 days.

## 2017-03-28 NOTE — Progress Notes (Signed)
No ICM remote transmission received for 03/12/2017 and next ICM transmission scheduled for 04/22/2017.

## 2017-04-18 DIAGNOSIS — R194 Change in bowel habit: Secondary | ICD-10-CM | POA: Diagnosis not present

## 2017-04-22 ENCOUNTER — Ambulatory Visit (INDEPENDENT_AMBULATORY_CARE_PROVIDER_SITE_OTHER): Payer: Medicare HMO

## 2017-04-22 DIAGNOSIS — Z950 Presence of cardiac pacemaker: Secondary | ICD-10-CM

## 2017-04-22 DIAGNOSIS — I5022 Chronic systolic (congestive) heart failure: Secondary | ICD-10-CM

## 2017-04-22 NOTE — Progress Notes (Signed)
EPIC Encounter for ICM Monitoring  Patient Name: [name] is a 84 y.o. male Date: 04/22/2017 Primary Care Physican: Wenda Low, MD Primary Cardiologist: Irish Lack  Electrophysiologist: Lovena Le  Dry Weight: not weighing        Heart Failure questions reviewed, pt asymptomatic.  He has been elevating his legs which has resolved any swelling in legs.   Thoracic impedance normal.  Prescribed dosage: Torsemide 20 mg 1 tablet daily. Potassium 10 mEq take 1 tablet every day as needed when taking Furosemide.  Labs: 07/06/2016 Creatinine 1.53, BUN 23, Potassium 3.7, Sodium 137, EGFR 40-47 06/07/2016 Creatinine 1.10, BUN 17, Potassium 3.4, Sodium 135, EGFR 60-69 01/07/2016 Creatinine 1.09, BUN 8, Potassium 3.8, Sodium 135, EGFR 59->60  01/06/2016 Creatinine 1.10, BUN 15, Potassium 4.5, Sodium 130, EGFR 58->60  11/02/2015 Creatinine 1.13, BUN 10, Potassium 4.1, Sodium 124, EGFR 56->60  10/13/2015 Creatinine 1.30, BUN 17, Potassium 4.4, Sodium 137       05/19/2015 Creatinine 1.54, BUN 20, Potassium 4.4, Sodium 137, EGFR 39-45   Recommendations: No changes.  Encouraged to call for fluid symptoms.  Follow-up plan: ICM clinic phone appointment on 05/23/2017.    Copy of ICM check sent to Dr. Lovena Le.   3 month ICM trend: 04/19/2017    1 Year ICM trend:       Rosalene Billings, RN 04/22/2017 1:35 PM

## 2017-05-09 ENCOUNTER — Encounter: Payer: Medicare HMO | Admitting: *Deleted

## 2017-05-09 ENCOUNTER — Telehealth: Payer: Self-pay | Admitting: Cardiology

## 2017-05-09 NOTE — Telephone Encounter (Signed)
Spoke with pt and reminded pt of remote transmission that is due today. Pt verbalized understanding and stated that he is in [facility] and will not be home for another 2 months. Pt will call the office when he returns to [place].

## 2017-05-10 ENCOUNTER — Encounter: Payer: Self-pay | Admitting: Cardiology

## 2017-05-22 ENCOUNTER — Encounter (INDEPENDENT_AMBULATORY_CARE_PROVIDER_SITE_OTHER): Payer: Medicare HMO | Admitting: Ophthalmology

## 2017-05-23 ENCOUNTER — Telehealth: Payer: Self-pay

## 2017-05-23 NOTE — Telephone Encounter (Signed)
ICM call to patient.  He does not have his monitor with him and currently in [place] and will not return until the 1st week in May.  Advised to take the monitor with him when he will be gone for extended periods of time.  Advised to go to ER or use 911 if he has any problems.  He is due to make an appointment with [name] for May and encouraged him to call in the next week to schedule that appointment.  Next ICM remote scheduled for 07/04/2017.

## 2017-06-09 IMAGING — MR MR LUMBAR SPINE W/O CM
5 of 6 series · 31 of 48 positions shown · non-contrast
Comparison: CT 12/25/2013.

CLINICAL DATA: Low back pain and bilateral leg pain right worse
than left, many years duration. Worsening of symptoms over the last
year. Intermittent numbness and tingling of both legs.

EXAM:
MRI LUMBAR SPINE WITHOUT CONTRAST
TECHNIQUE: Multiplanar, multisequence MR imaging of the lumbar spine was
performed. No intravenous contrast was administered.

[Series 2: T1 · coronal · 4.0mm · 0.59mm/px · 9 of 30 slices shown (1 of 3)]
[im 1/30]
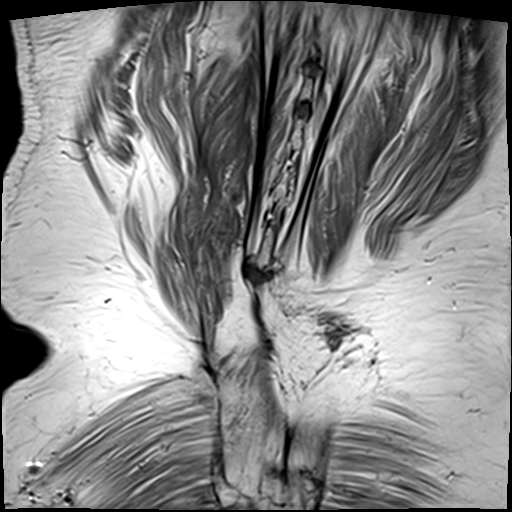
[im 4/30]
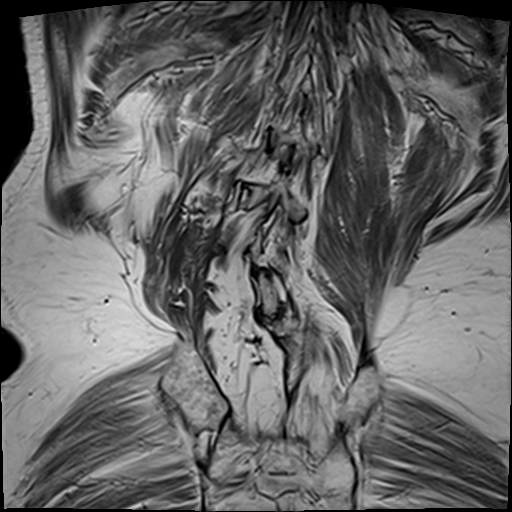
[im 8/30]
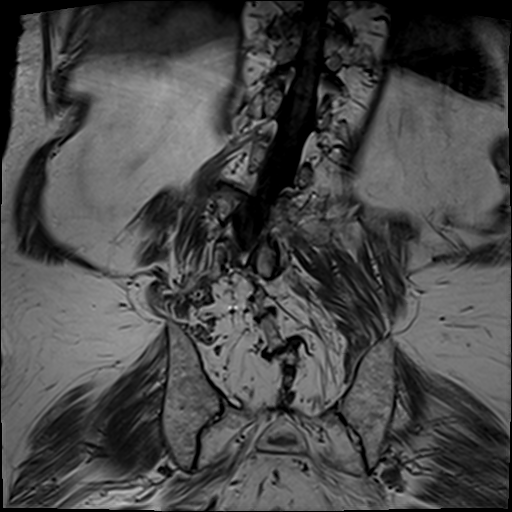
[im 11/30]
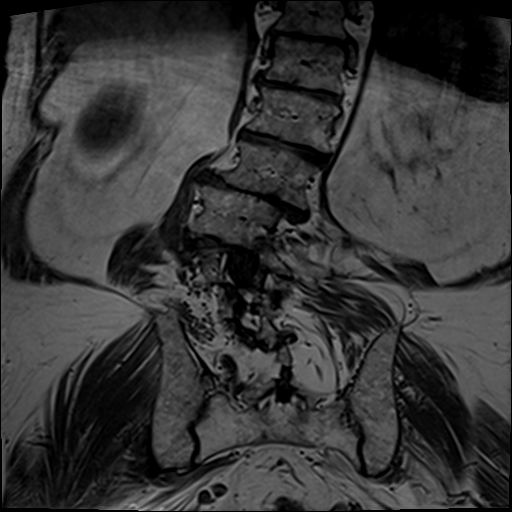
[im 15/30]
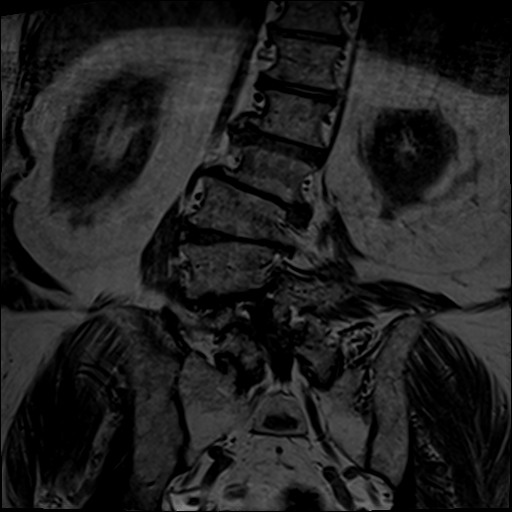
[im 19/30]
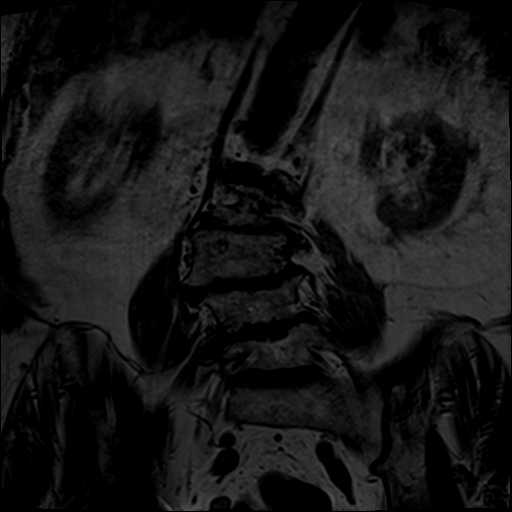
[im 22/30]
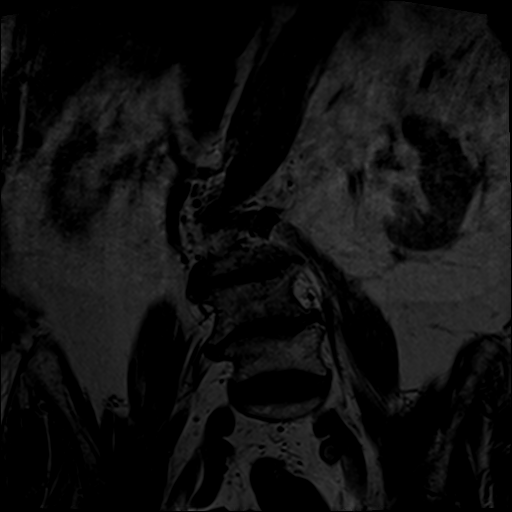
[im 26/30]
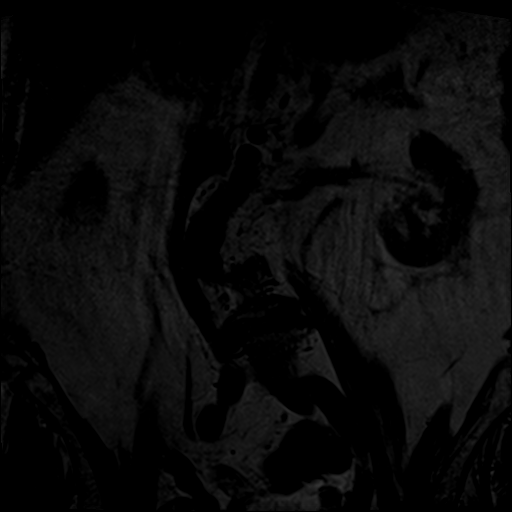
[im 30/30]
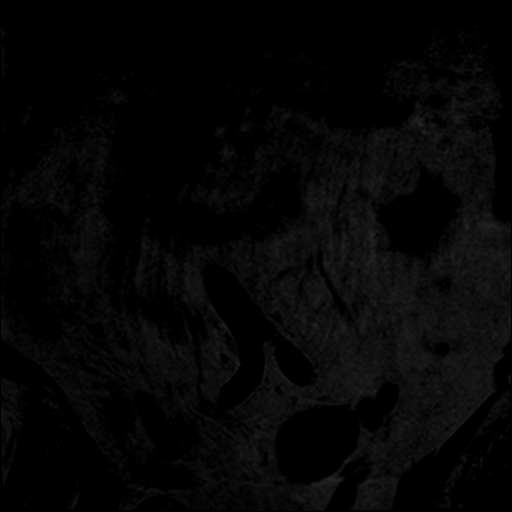

[Series 3: T2 · sagittal · 4.0mm · 0.81mm/px · 7 of 25 slices shown (1 of 2)]
[im 1/25]
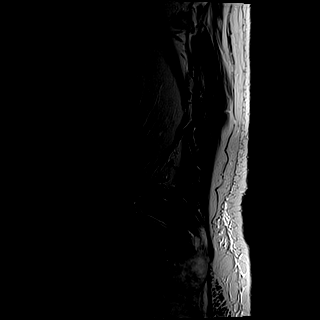
[im 5/25]
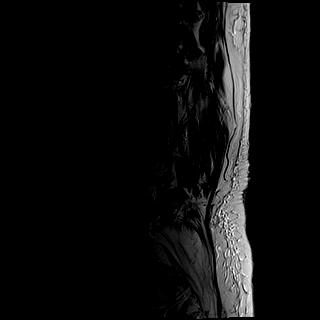
[im 9/25]
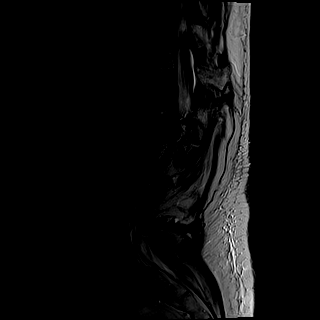
[im 13/25]
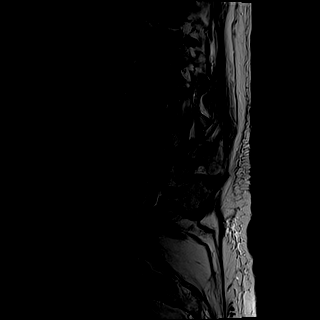
[im 17/25]
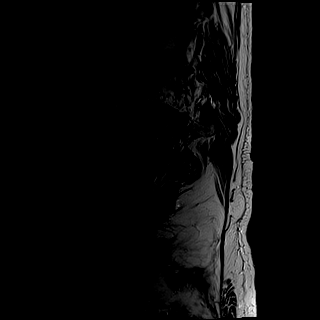
[im 21/25]
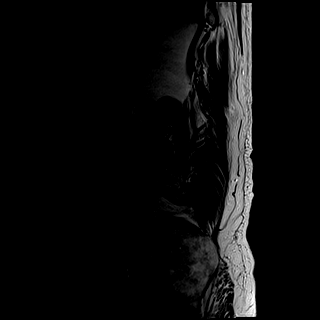
[im 25/25]
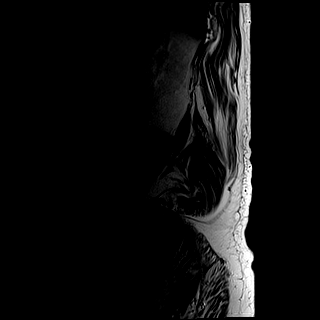

[Series 4: T1 · sagittal · 4.0mm · 0.81mm/px · 6 of 25 slices shown (2 of 3)]
[im 1/25]
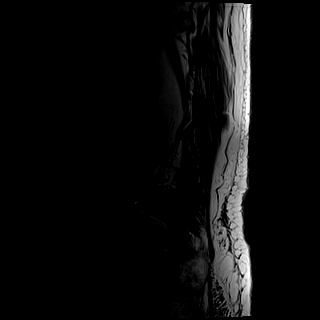
[im 5/25]
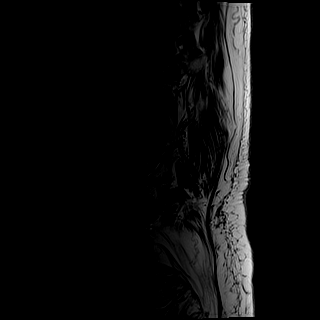
[im 10/25]
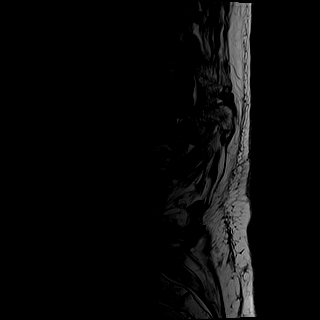
[im 15/25]
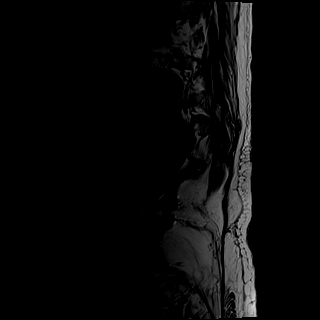
[im 20/25]
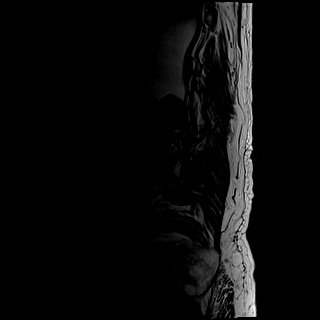
[im 25/25]
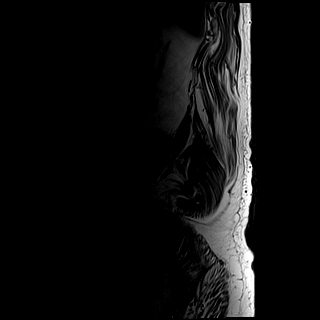

[Series 6: T2 · axial · 4.0mm · 0.78mm/px · z∈[-65,+130]mm · 8 of 40 slices shown (2 of 2)]
[im 1/40]
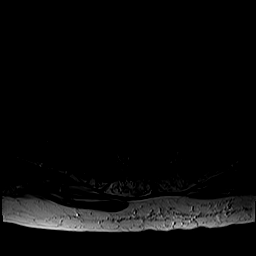
[im 5/40]
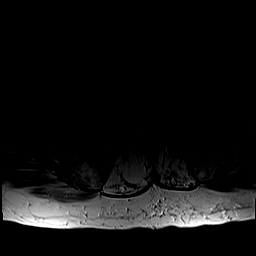
[im 14/40]
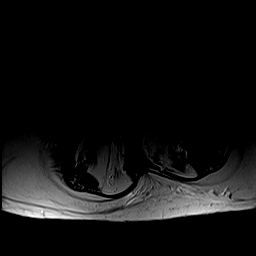
[im 18/40]
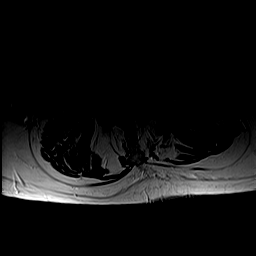
[im 22/40]
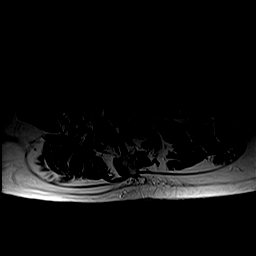
[im 27/40]
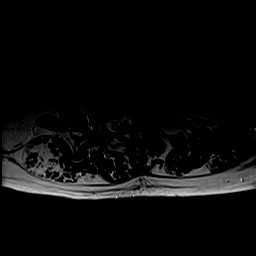
[im 35/40]
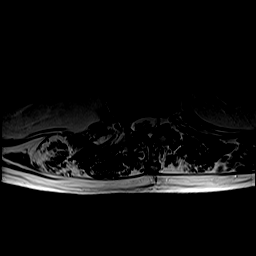
[im 40/40]
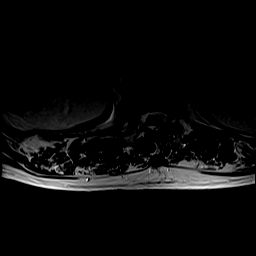

[Series 7: T1 · axial · 4.0mm · 0.39mm/px · 1 of 40 slices shown (3 of 3)]
[im 1/40]
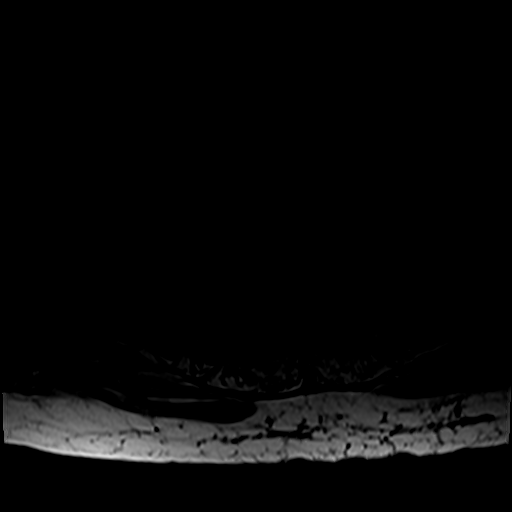

[31 of 48 positions shown; findings below may reference images not displayed]

FINDINGS: There is curvature convex to the right with the apex at L3.

T12-L1: Mild bulging of the disc. Mild facet hypertrophy. No
stenosis.

L1-2: Desiccation and bulging of the disc. Endplate osteophytes.
Facet hypertrophy on the left. Stenosis of the left lateral recess
and intervertebral foramen on the left that could cause neural
compression.

L2-3: Disc degeneration with endplate osteophytes and bulging of the
disc. Facet hypertrophy on the left. Stenosis of the left lateral
recess and intervertebral foramen on the left that could cause
neural compression.

L3-4: Endplate osteophytes and bulging of the disc. Mild facet
hypertrophy. I think there is been previous posterior decompression
at this level. Sufficient patency of the central canal. Lateral
recess and foraminal stenosis bilaterally that could cause neural
compression.

L4-5: Disc degeneration with endplate osteophytes and shallow
protrusion of disc material. Bilateral facet hypertrophy. Previous
posterior decompression. Stenosis of both lateral recesses and
neural foramina could cause neural compression on either or both
sides.

L5-S1: Mild bulging of the disc. Mild facet hypertrophy on the
right. No compressive stenosis.
IMPRESSION: Spinal curvature convex to the right with the apex at L3.
Degenerative disc disease and degenerative facet disease with left
lateral recess and foraminal narrowing at L1-2 and L2-3 that could
cause left-sided neural compression. Previous posterior
decompression at L3-4 and L4-5. Stenosis of both lateral recesses
and foramina at those levels that could cause neural compression on
either or both sides.

## 2017-06-24 ENCOUNTER — Ambulatory Visit (INDEPENDENT_AMBULATORY_CARE_PROVIDER_SITE_OTHER): Payer: Medicare HMO | Admitting: *Deleted

## 2017-06-24 DIAGNOSIS — I480 Paroxysmal atrial fibrillation: Secondary | ICD-10-CM

## 2017-06-25 ENCOUNTER — Encounter (INDEPENDENT_AMBULATORY_CARE_PROVIDER_SITE_OTHER): Payer: Medicare HMO | Admitting: Ophthalmology

## 2017-06-25 ENCOUNTER — Encounter: Payer: Self-pay | Admitting: Cardiology

## 2017-06-25 DIAGNOSIS — H401112 Primary open-angle glaucoma, right eye, moderate stage: Secondary | ICD-10-CM | POA: Diagnosis not present

## 2017-06-25 DIAGNOSIS — H532 Diplopia: Secondary | ICD-10-CM | POA: Diagnosis not present

## 2017-06-25 DIAGNOSIS — H01024 Squamous blepharitis left upper eyelid: Secondary | ICD-10-CM | POA: Diagnosis not present

## 2017-06-25 DIAGNOSIS — H01022 Squamous blepharitis right lower eyelid: Secondary | ICD-10-CM | POA: Diagnosis not present

## 2017-06-25 DIAGNOSIS — H01025 Squamous blepharitis left lower eyelid: Secondary | ICD-10-CM | POA: Diagnosis not present

## 2017-06-25 DIAGNOSIS — H401123 Primary open-angle glaucoma, left eye, severe stage: Secondary | ICD-10-CM | POA: Diagnosis not present

## 2017-06-25 DIAGNOSIS — H02831 Dermatochalasis of right upper eyelid: Secondary | ICD-10-CM | POA: Diagnosis not present

## 2017-06-25 DIAGNOSIS — H348322 Tributary (branch) retinal vein occlusion, left eye, stable: Secondary | ICD-10-CM | POA: Diagnosis not present

## 2017-06-25 DIAGNOSIS — H01021 Squamous blepharitis right upper eyelid: Secondary | ICD-10-CM | POA: Diagnosis not present

## 2017-06-25 NOTE — Progress Notes (Signed)
Remote pacemaker transmission.   

## 2017-07-04 ENCOUNTER — Ambulatory Visit (INDEPENDENT_AMBULATORY_CARE_PROVIDER_SITE_OTHER): Payer: Medicare HMO

## 2017-07-04 ENCOUNTER — Telehealth: Payer: Self-pay | Admitting: Cardiology

## 2017-07-04 DIAGNOSIS — Z950 Presence of cardiac pacemaker: Secondary | ICD-10-CM | POA: Diagnosis not present

## 2017-07-04 DIAGNOSIS — I5022 Chronic systolic (congestive) heart failure: Secondary | ICD-10-CM

## 2017-07-04 NOTE — Telephone Encounter (Signed)
Spoke with pt and reminded pt of remote transmission that is due today. Pt verbalized understanding.   

## 2017-07-05 NOTE — Progress Notes (Signed)
EPIC Encounter for ICM Monitoring  Patient Name: [name] is a 84 y.o. male Date: 07/05/2017 Primary Care Physican: Wenda Low, MD Primary Cardiologist: Irish Lack  Electrophysiologist: Lovena Le  Dry Weight: not weighing        Heart Failure questions reviewed, pt asymptomatic.   Thoracic impedance trending just below baseline normal.  Prescribed dosage: Torsemide 20 mg 1 tablet daily. Potassium 10 mEq take 1 tablet every day as needed when taking Furosemide.  Labs: 07/06/2016 Creatinine 1.53, BUN 23, Potassium 3.7, Sodium 137, EGFR 40-47 06/07/2016 Creatinine 1.10, BUN 17, Potassium 3.4, Sodium 135, EGFR 60-69 01/07/2016 Creatinine 1.09, BUN 8, Potassium 3.8, Sodium 135, EGFR 59->60  01/06/2016 Creatinine 1.10, BUN 15, Potassium 4.5, Sodium 130, EGFR 58->60  11/02/2015 Creatinine 1.13, BUN 10, Potassium 4.1, Sodium 124, EGFR 56->60  10/13/2015 Creatinine 1.30, BUN 17, Potassium 4.4, Sodium 137      05/19/2015 Creatinine 1.54, BUN 20, Potassium 4.4, Sodium 137, EGFR 39-45  Recommendations: Advised to restrict salt intake.  Encouraged to call for fluid symptoms.   Follow-up plan: ICM clinic phone appointment on 07/15/2017 to recheck fluid levels.  Office appointment scheduled 08/28/2017 with Dr. Lovena Le.  Copy of ICM check sent to Dr. Lovena Le.   3 month ICM trend: 07/05/2017    1 Year ICM trend:       Rosalene Billings, RN 07/05/2017 10:25 AM

## 2017-07-08 DIAGNOSIS — Z923 Personal history of irradiation: Secondary | ICD-10-CM | POA: Diagnosis not present

## 2017-07-08 DIAGNOSIS — D320 Benign neoplasm of cerebral meninges: Secondary | ICD-10-CM | POA: Diagnosis not present

## 2017-07-08 DIAGNOSIS — D329 Benign neoplasm of meninges, unspecified: Secondary | ICD-10-CM | POA: Diagnosis not present

## 2017-07-08 DIAGNOSIS — H4921 Sixth [abducent] nerve palsy, right eye: Secondary | ICD-10-CM | POA: Diagnosis not present

## 2017-07-08 DIAGNOSIS — Z9889 Other specified postprocedural states: Secondary | ICD-10-CM | POA: Diagnosis not present

## 2017-07-10 IMAGING — CR DG CHEST 2V
2 series · 2 of 2 positions shown · non-contrast
Comparison: Chest x-ray 12/14/2014.

CLINICAL DATA: 88-year-old male with chronic productive cough.
Central chest pain for the past 3 days.

EXAM:
CHEST  2 VIEW

[chest pa]
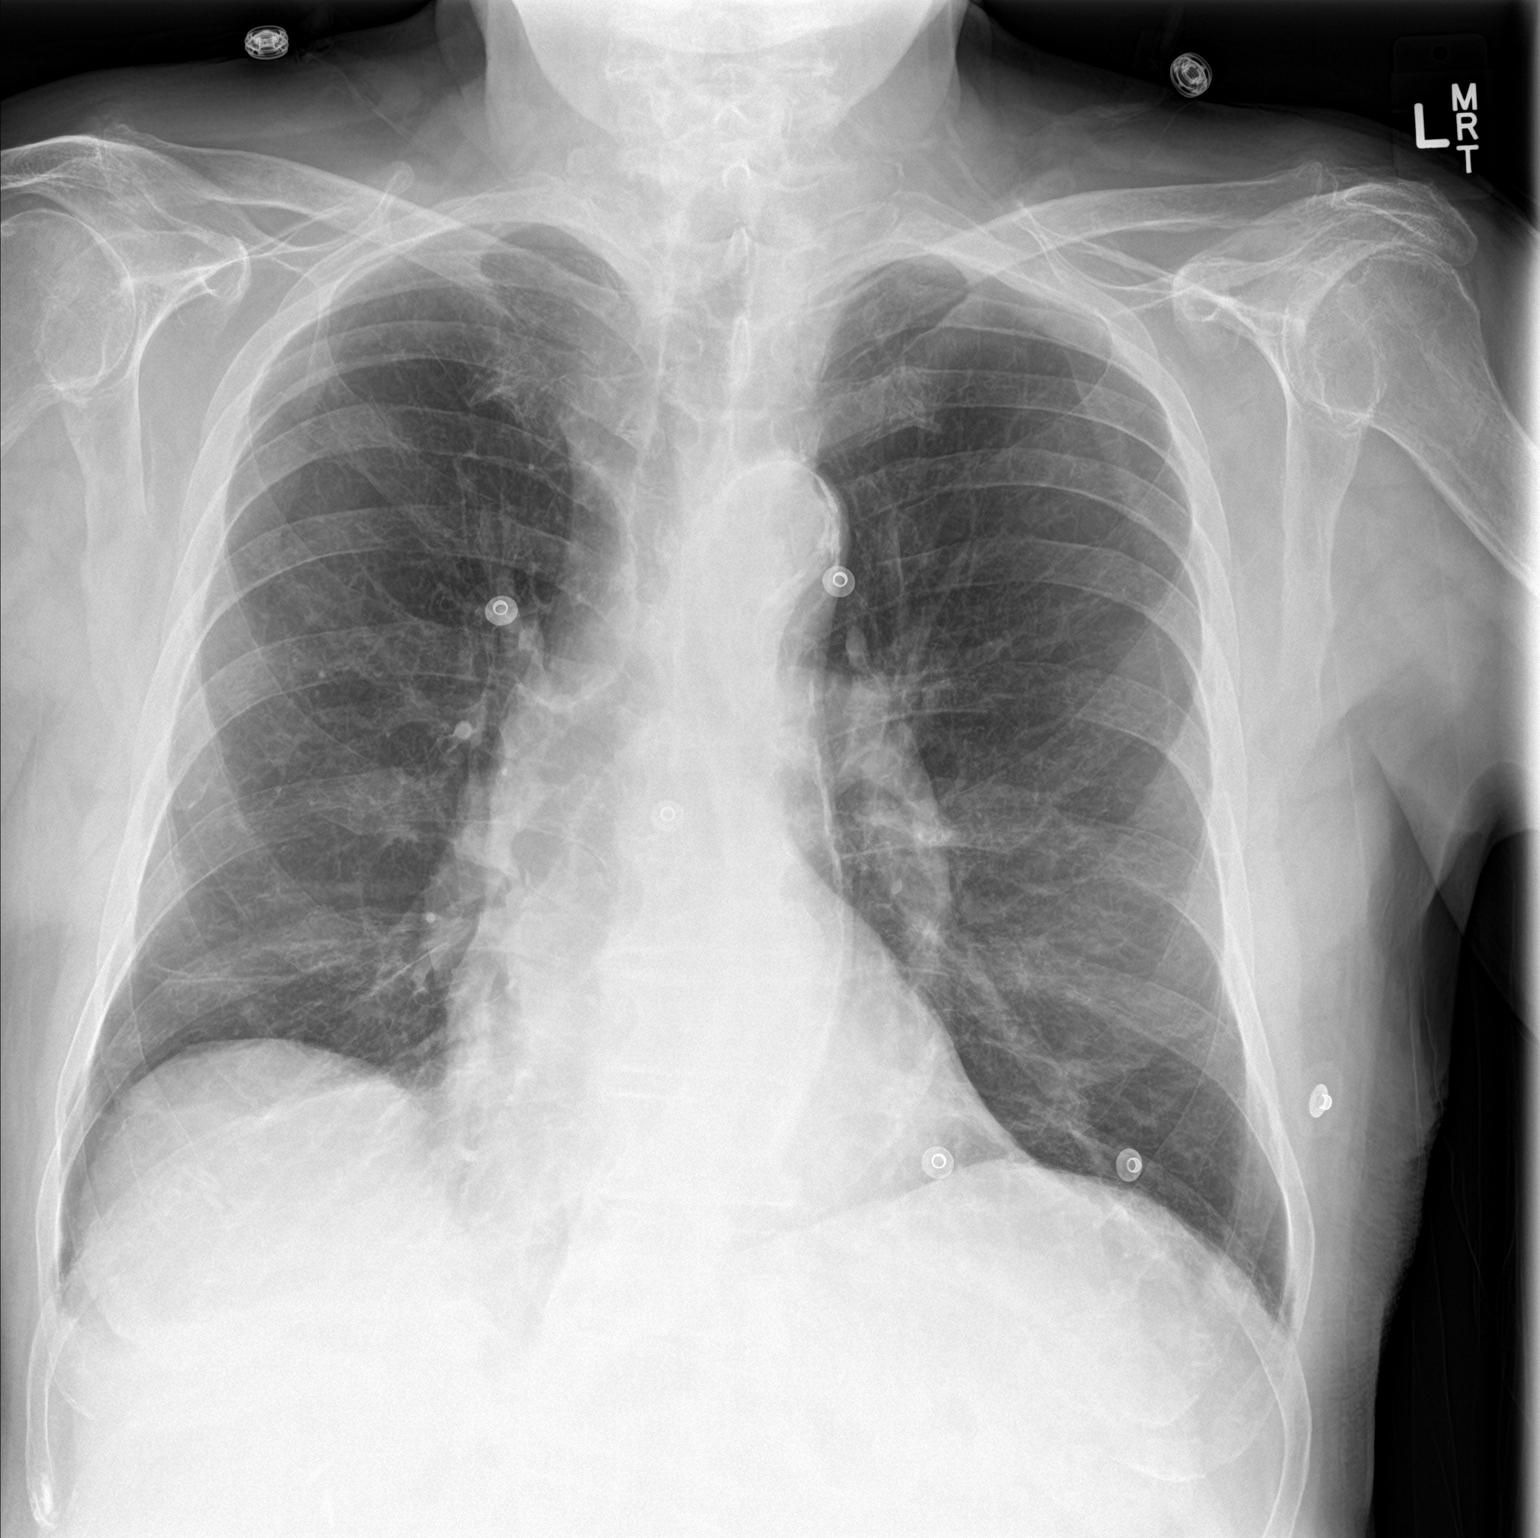

[chest lat]
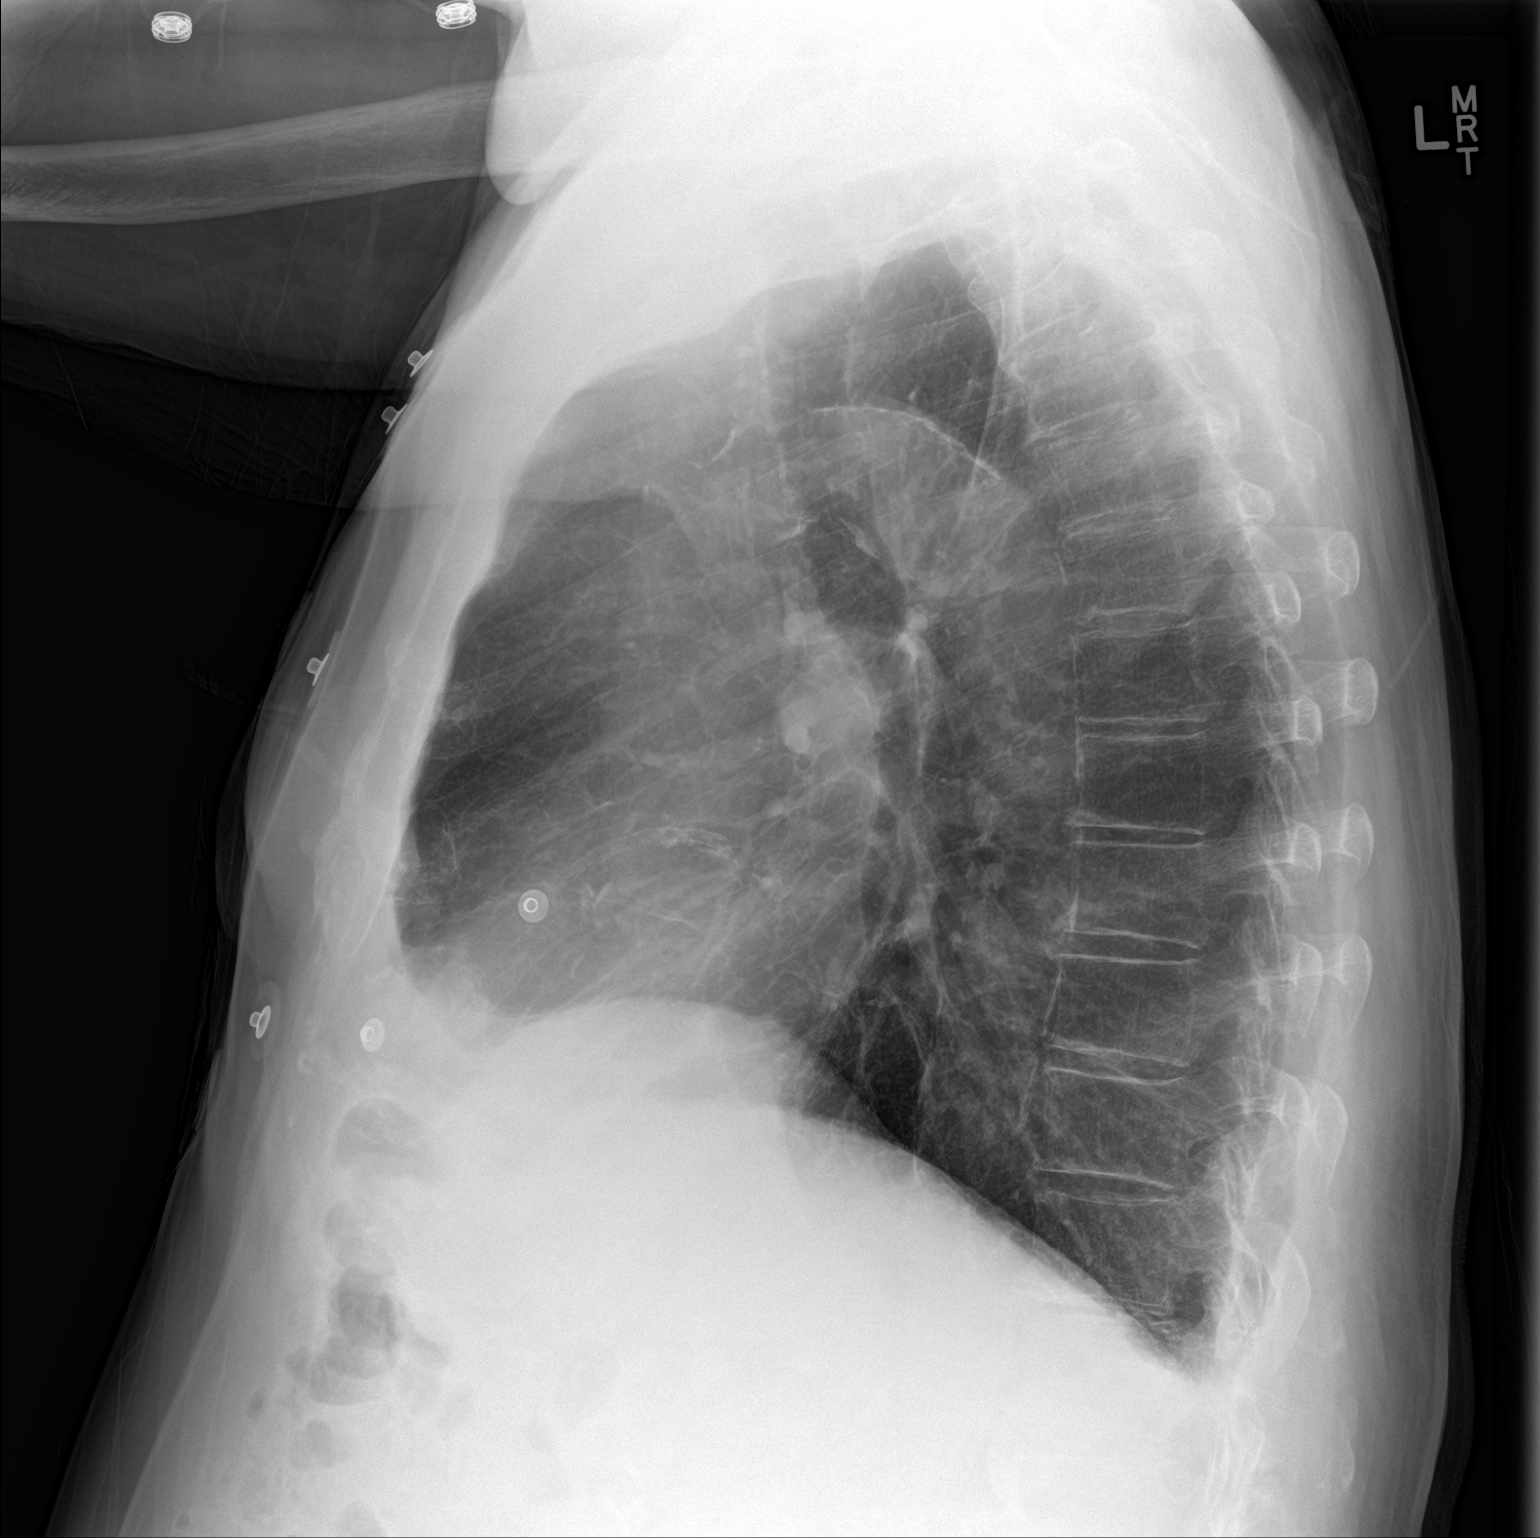

[2 of 2 positions shown; findings below may reference images not displayed]

FINDINGS: Lung volumes are normal. No consolidative airspace disease. No
pleural effusions. No pneumothorax. No pulmonary nodule or mass
noted. Pulmonary vasculature and the cardiomediastinal silhouette
are within normal limits. Atherosclerosis in the thoracic aorta.
IMPRESSION: 1.  No radiographic evidence of acute cardiopulmonary disease.
2. Atherosclerosis.

## 2017-07-13 IMAGING — DX DG CHEST 2V
2 series · 2 of 2 positions shown · non-contrast
Comparison: January 01, 2015

CLINICAL DATA: Shortness of Breath

EXAM:
CHEST  2 VIEW

[chest pa]
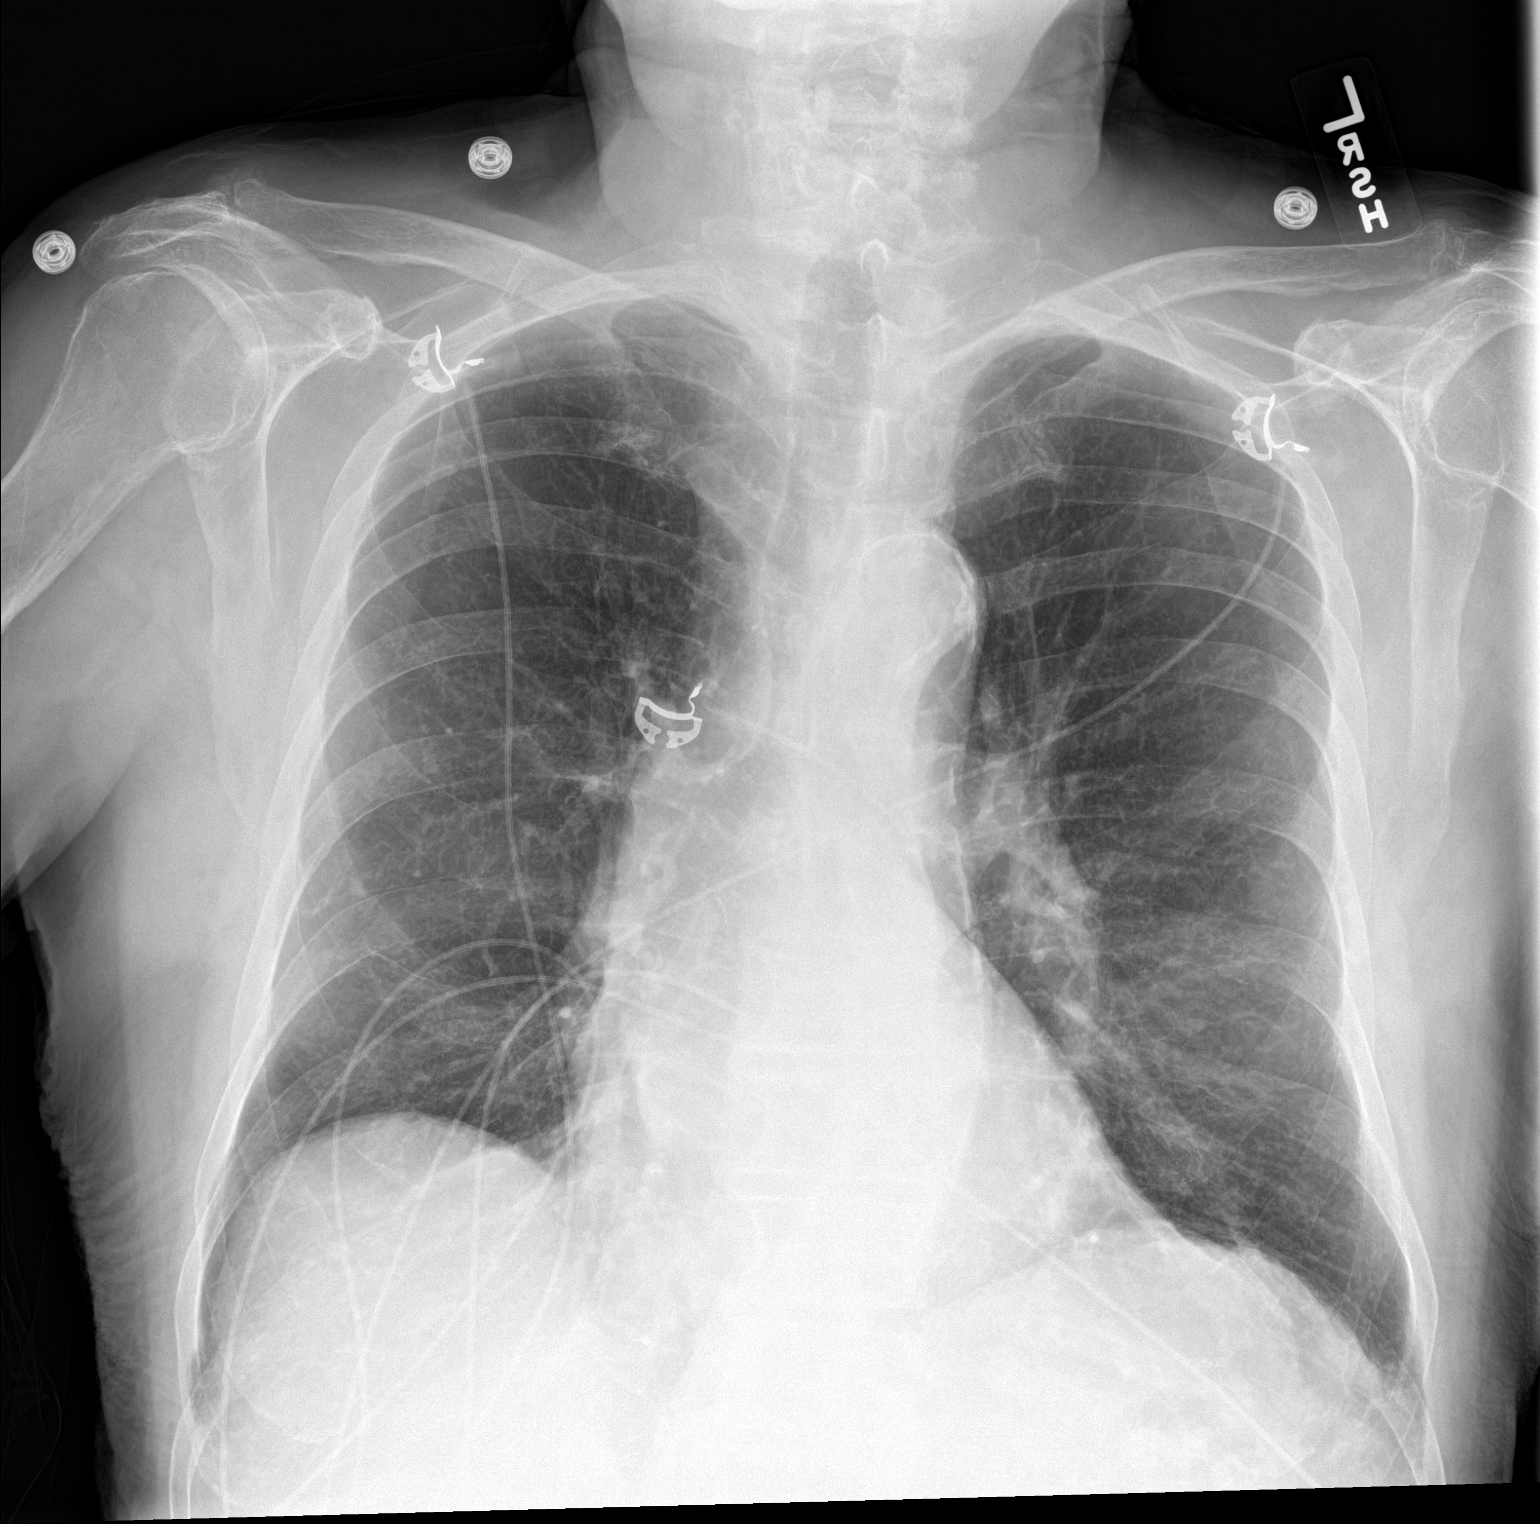

[chest lat]
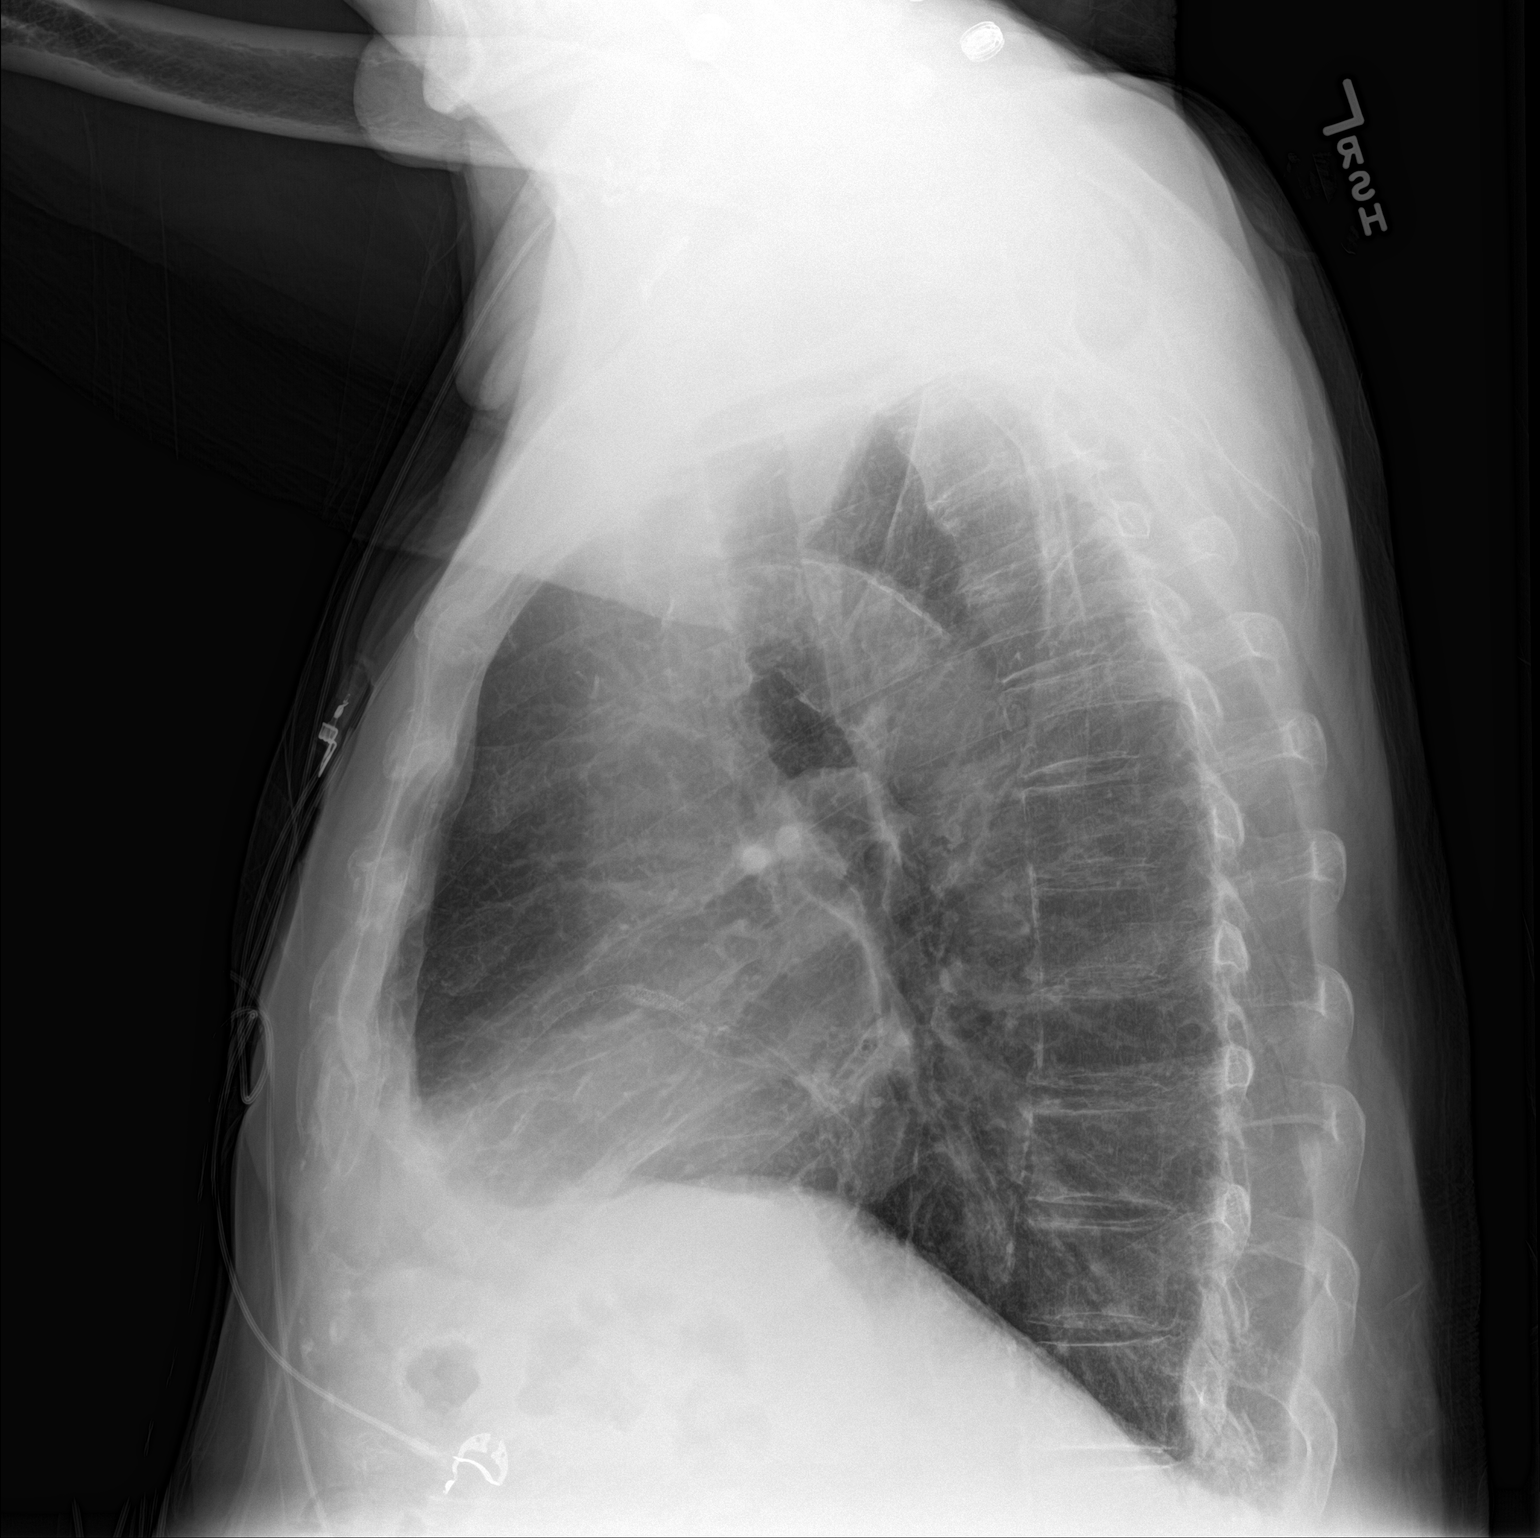

[2 of 2 positions shown; findings below may reference images not displayed]

FINDINGS: There is slight scarring in the bases. No edema or consolidation.
Heart size and pulmonary vascularity are normal. No adenopathy. No
pneumothorax. There is extensive atherosclerotic calcification in
the aorta. No bone lesions. There is calcification in the right
carotid artery as well as coronary artery calcification.
IMPRESSION: Slight bibasilar lung scarring. No edema or consolidation. Extensive
atherosclerotic calcification in aorta. There is also calcification
in the right carotid artery. There are foci of coronary artery
calcification as well.

## 2017-07-15 ENCOUNTER — Ambulatory Visit (INDEPENDENT_AMBULATORY_CARE_PROVIDER_SITE_OTHER): Payer: Self-pay

## 2017-07-15 DIAGNOSIS — Z950 Presence of cardiac pacemaker: Secondary | ICD-10-CM

## 2017-07-15 DIAGNOSIS — I5022 Chronic systolic (congestive) heart failure: Secondary | ICD-10-CM

## 2017-07-16 LAB — CUP PACEART REMOTE DEVICE CHECK
Implantable Lead Implant Date: 20161205
Implantable Lead Implant Date: 20161205
Implantable Lead Implant Date: 20161205
Implantable Lead Location: 753858
Implantable Pulse Generator Implant Date: 20161205
MDC IDC LEAD LOCATION: 753859
MDC IDC LEAD LOCATION: 753860
MDC IDC SESS DTM: 20190521091648
Pulse Gen Model: 3262
Pulse Gen Serial Number: 7802901

## 2017-07-16 NOTE — Progress Notes (Signed)
EPIC Encounter for ICM Monitoring  Patient Name: [name] is a 84 y.o. male Date: 07/16/2017 Primary Care Physican: Wenda Low, MD Primary Cardiologist:Varanasi  Electrophysiologist:Taylor  Dry Weight:not weighing        Heart Failure questions reviewed, pt asymptomatic.   Thoracic impedance normal.  Prescribed dosage: Torsemide 20 mg 1 tablet daily. Potassium 10 mEq take 1 tablet every day as needed when taking Furosemide.  Labs: 07/06/2016 Creatinine 1.53, BUN 23, Potassium 3.7, Sodium 137, EGFR 40-47 06/07/2016 Creatinine 1.10, BUN 17, Potassium 3.4, Sodium 135, EGFR 60-69 01/07/2016 Creatinine 1.09, BUN 8, Potassium 3.8, Sodium 135, EGFR 59->60  01/06/2016 Creatinine 1.10, BUN 15, Potassium 4.5, Sodium 130, EGFR 58->60  11/02/2015 Creatinine 1.13, BUN 10, Potassium 4.1, Sodium 124, EGFR 56->60  10/13/2015 Creatinine 1.30, BUN 17, Potassium 4.4, Sodium 137  05/19/2015 Creatinine 1.54, BUN 20, Potassium 4.4, Sodium 137, EGFR 39-45  Recommendations: No changes.   Encouraged to call for fluid symptoms.  Follow-up plan: ICM clinic phone appointment on 08/05/2017.  Office appointment scheduled 08/28/2017 with Dr. Lovena Le.  Copy of ICM [name] sent to Dr. Lovena Le.   3 month ICM trend: 07/15/2017    1 Year ICM trend:       Rosalene Billings, RN 07/16/2017 10:42 AM

## 2017-07-23 DIAGNOSIS — H532 Diplopia: Secondary | ICD-10-CM | POA: Diagnosis not present

## 2017-07-23 DIAGNOSIS — H401112 Primary open-angle glaucoma, right eye, moderate stage: Secondary | ICD-10-CM | POA: Diagnosis not present

## 2017-07-23 DIAGNOSIS — H401123 Primary open-angle glaucoma, left eye, severe stage: Secondary | ICD-10-CM | POA: Diagnosis not present

## 2017-07-23 DIAGNOSIS — H01021 Squamous blepharitis right upper eyelid: Secondary | ICD-10-CM | POA: Diagnosis not present

## 2017-07-23 DIAGNOSIS — H01025 Squamous blepharitis left lower eyelid: Secondary | ICD-10-CM | POA: Diagnosis not present

## 2017-07-23 DIAGNOSIS — H01022 Squamous blepharitis right lower eyelid: Secondary | ICD-10-CM | POA: Diagnosis not present

## 2017-07-23 DIAGNOSIS — H02831 Dermatochalasis of right upper eyelid: Secondary | ICD-10-CM | POA: Diagnosis not present

## 2017-07-23 DIAGNOSIS — H01024 Squamous blepharitis left upper eyelid: Secondary | ICD-10-CM | POA: Diagnosis not present

## 2017-07-23 DIAGNOSIS — H348322 Tributary (branch) retinal vein occlusion, left eye, stable: Secondary | ICD-10-CM | POA: Diagnosis not present

## 2017-08-05 ENCOUNTER — Ambulatory Visit (INDEPENDENT_AMBULATORY_CARE_PROVIDER_SITE_OTHER): Payer: Medicare HMO

## 2017-08-05 DIAGNOSIS — I5022 Chronic systolic (congestive) heart failure: Secondary | ICD-10-CM | POA: Diagnosis not present

## 2017-08-05 DIAGNOSIS — Z950 Presence of cardiac pacemaker: Secondary | ICD-10-CM | POA: Diagnosis not present

## 2017-08-05 NOTE — Progress Notes (Signed)
EPIC Encounter for ICM Monitoring  Patient Name: [name] is a 84 y.o. male Date: 08/05/2017 Primary Care Physican: Husain, Karrar, MD Primary Cardiologist:Varanasi  Electrophysiologist:Taylor  Dry Weight:not weighing       Heart Failure questions reviewed, pt swelling in feet in last few days.  Advised Renee Ursuy, PA has ordered BMP since he has not had one since 2018. He said he will go tomorrow afternoon to have it drawn.  He has been self adjusting the Torsemide and taking extra 20 mg tablet when needed.  He does not feel like that the extra tablet is increasing the urination.       Thoracic impedance abnormal suggesting fluid accumulation for the last 3-4 days.  Prescribed dosage: Torsemide 20 mg 1 tablet daily. Potassium 10 mEq take 1 tablet every day as needed when taking Furosemide.  Labs: 07/06/2016 Creatinine 1.53, BUN 23, Potassium 3.7, Sodium 137, EGFR 40-47 06/07/2016 Creatinine 1.10, BUN 17, Potassium 3.4, Sodium 135, EGFR 60-69 01/07/2016 Creatinine 1.09, BUN 8, Potassium 3.8, Sodium 135, EGFR 59->60  01/06/2016 Creatinine 1.10, BUN 15, Potassium 4.5, Sodium 130, EGFR 58->60  11/02/2015 Creatinine 1.13, BUN 10, Potassium 4.1, Sodium 124, EGFR 56->60  10/13/2015 Creatinine 1.30, BUN 17, Potassium 4.4, Sodium 137  05/19/2015 Creatinine 1.54, BUN 20, Potassium 4.4, Sodium 137, EGFR 39-45  Recommendations:  Advised to have labs drawn tomorrow and to decrease salt intake. Advised if Dr Taylor or Dr Varanasi have any recommendations will call back.    Follow-up plan: ICM clinic phone appointment on 08/13/2017 to recheck fluid levels.  Office appointment scheduled7/04/2017 with Dr.Taylor.  Copy of ICM check sent to Dr. Taylor and Dr. Varanasi for review and recommendations if needed.   3 month ICM trend: 08/05/2017    1 Year ICM trend:       Laurie S Short, RN 08/05/2017 2:10 PM    

## 2017-08-06 ENCOUNTER — Other Ambulatory Visit: Payer: Medicare HMO

## 2017-08-06 DIAGNOSIS — I5022 Chronic systolic (congestive) heart failure: Secondary | ICD-10-CM

## 2017-08-07 LAB — BASIC METABOLIC PANEL
BUN/Creatinine Ratio: 14 (ref 10–24)
BUN: 17 mg/dL (ref 8–27)
CHLORIDE: 103 mmol/L (ref 96–106)
CO2: 23 mmol/L (ref 20–29)
CREATININE: 1.21 mg/dL (ref 0.76–1.27)
Calcium: 9.1 mg/dL (ref 8.6–10.2)
GFR calc Af Amer: 61 mL/min/{1.73_m2} (ref >59)
GFR calc non Af Amer: 53 mL/min/{1.73_m2} — ABNORMAL LOW (ref >59)
GLUCOSE: 93 mg/dL (ref 65–99)
Potassium: 4.4 mmol/L (ref 3.5–5.2)
SODIUM: 141 mmol/L (ref 134–144)

## 2017-08-08 NOTE — Progress Notes (Signed)
Patient called back and reviewed lab results.  He stated he is feeling about the same.  His feet swelling during the day but go down at night when lying down.  He normally self adjust Torsemide when needed and will take an extra tomorrow.  Advised will recheck transmission on 08/13/2017 and to call back if swelling worsens or he has other fluid symptoms.

## 2017-08-08 NOTE — Progress Notes (Signed)
Attempted call to patient for follow up on symptoms and recent lab results.

## 2017-08-10 ENCOUNTER — Telehealth: Payer: Self-pay | Admitting: Cardiology

## 2017-08-10 ENCOUNTER — Encounter (HOSPITAL_COMMUNITY): Payer: Self-pay | Admitting: *Deleted

## 2017-08-10 ENCOUNTER — Other Ambulatory Visit: Payer: Self-pay

## 2017-08-10 ENCOUNTER — Emergency Department (HOSPITAL_COMMUNITY): Payer: Medicare HMO

## 2017-08-10 ENCOUNTER — Emergency Department (HOSPITAL_COMMUNITY)
Admission: EM | Admit: 2017-08-10 | Discharge: 2017-08-10 | Disposition: A | Discharge status: Home / Self Care | Payer: Medicare HMO | Attending: Emergency Medicine | Admitting: Emergency Medicine

## 2017-08-10 DIAGNOSIS — I252 Old myocardial infarction: Secondary | ICD-10-CM | POA: Insufficient documentation

## 2017-08-10 DIAGNOSIS — N183 Chronic kidney disease, stage 3 (moderate): Secondary | ICD-10-CM | POA: Insufficient documentation

## 2017-08-10 DIAGNOSIS — I5043 Acute on chronic combined systolic (congestive) and diastolic (congestive) heart failure: Secondary | ICD-10-CM | POA: Diagnosis not present

## 2017-08-10 DIAGNOSIS — Z8546 Personal history of malignant neoplasm of prostate: Secondary | ICD-10-CM | POA: Insufficient documentation

## 2017-08-10 DIAGNOSIS — I130 Hypertensive heart and chronic kidney disease with heart failure and stage 1 through stage 4 chronic kidney disease, or unspecified chronic kidney disease: Secondary | ICD-10-CM | POA: Diagnosis not present

## 2017-08-10 DIAGNOSIS — Z87891 Personal history of nicotine dependence: Secondary | ICD-10-CM | POA: Diagnosis not present

## 2017-08-10 DIAGNOSIS — I110 Hypertensive heart disease with heart failure: Secondary | ICD-10-CM | POA: Diagnosis not present

## 2017-08-10 DIAGNOSIS — Z96653 Presence of artificial knee joint, bilateral: Secondary | ICD-10-CM | POA: Diagnosis not present

## 2017-08-10 DIAGNOSIS — R0602 Shortness of breath: Secondary | ICD-10-CM | POA: Diagnosis not present

## 2017-08-10 DIAGNOSIS — Z7902 Long term (current) use of antithrombotics/antiplatelets: Secondary | ICD-10-CM | POA: Insufficient documentation

## 2017-08-10 DIAGNOSIS — Z96643 Presence of artificial hip joint, bilateral: Secondary | ICD-10-CM | POA: Insufficient documentation

## 2017-08-10 DIAGNOSIS — I2510 Atherosclerotic heart disease of native coronary artery without angina pectoris: Secondary | ICD-10-CM | POA: Insufficient documentation

## 2017-08-10 DIAGNOSIS — R0902 Hypoxemia: Secondary | ICD-10-CM | POA: Diagnosis not present

## 2017-08-10 DIAGNOSIS — Z79899 Other long term (current) drug therapy: Secondary | ICD-10-CM | POA: Insufficient documentation

## 2017-08-10 DIAGNOSIS — R0600 Dyspnea, unspecified: Secondary | ICD-10-CM | POA: Diagnosis not present

## 2017-08-10 DIAGNOSIS — I509 Heart failure, unspecified: Secondary | ICD-10-CM | POA: Diagnosis not present

## 2017-08-10 LAB — BASIC METABOLIC PANEL
Anion gap: 12 (ref 5–15)
BUN: 21 mg/dL — AB (ref 6–20)
CO2: 23 mmol/L (ref 22–32)
CREATININE: 1.24 mg/dL (ref 0.61–1.24)
Calcium: 9.1 mg/dL (ref 8.9–10.3)
Chloride: 104 mmol/L (ref 101–111)
GFR calc non Af Amer: 50 mL/min — ABNORMAL LOW (ref >60)
GFR, EST AFRICAN AMERICAN: 58 mL/min — AB (ref >60)
Glucose, Bld: 85 mg/dL (ref 65–99)
POTASSIUM: 4 mmol/L (ref 3.5–5.1)
SODIUM: 139 mmol/L (ref 135–145)

## 2017-08-10 LAB — BRAIN NATRIURETIC PEPTIDE: B NATRIURETIC PEPTIDE 5: 514.2 pg/mL — AB (ref 0.0–100.0)

## 2017-08-10 LAB — CBC
HEMATOCRIT: 42.5 % (ref 39.0–52.0)
Hemoglobin: 14 g/dL (ref 13.0–17.0)
MCH: 30.4 pg (ref 26.0–34.0)
MCHC: 32.9 g/dL (ref 30.0–36.0)
MCV: 92.4 fL (ref 78.0–100.0)
PLATELETS: 124 10*3/uL — AB (ref 150–400)
RBC: 4.6 MIL/uL (ref 4.22–5.81)
RDW: 14.3 % (ref 11.5–15.5)
WBC: 5.6 10*3/uL (ref 4.0–10.5)

## 2017-08-10 LAB — TROPONIN I: Troponin I: <0.03 ng/mL (ref <0.03)

## 2017-08-10 MED ORDER — FUROSEMIDE 10 MG/ML IJ SOLN
40.0000 mg | Freq: Once | INTRAMUSCULAR | Status: AC
  Administered 2017-08-10: 40 mg via INTRAVENOUS
  Filled 2017-08-10: qty 4

## 2017-08-10 NOTE — ED Notes (Addendum)
Pt ambulated with assistance, maintaining a O2 sat of 96 and above for the majority of the walk. Towards the end, pt desated to 85% but did not report feeling any different or SOB.

## 2017-08-10 NOTE — Telephone Encounter (Signed)
Patient's daughter in law called stating that patient has a pacemaker and his heart rate was low and BP low and he was SOB and they called EMS and he is going to [facility] ER

## 2017-08-10 NOTE — Discharge Instructions (Signed)
It was my pleasure taking care of you today!  Take your torsemide (Demadex) daily as usual.   It is very important that you call your cardiologist first thing Monday morning to get a follow up appointment as soon as possible, preferably on Monday or Tuesday.   As we discussed, you should return to ER if your breathing worsens, you have pain in your chest, new or worsening symptoms or if you have any additional concerns.

## 2017-08-10 NOTE — ED Provider Notes (Signed)
[facility] EMERGENCY DEPARTMENT Provider Note   CSN: [id number] Arrival date & time: 08/10/17  1826     History   Chief Complaint Chief Complaint  Patient presents with  . Shortness of Breath    HPI [name] is a 84 y.o. male.  The history is provided by the patient and medical records. No language interpreter was used.  Shortness of Breath  Associated symptoms include cough and leg swelling. Pertinent negatives include no chest pain.   [name] is a 84 y.o. male  with a PMH of CHF, afib, CAD, pacemaker in place, CKD, HTN who presents to the Emergency Department complaining of shortness of breath x 3 days. Feels as if breathing was a little worse today. Harder to breath when he lays flat or reclines back to far in his chair. Associated with cough and some lower extremity swelling, mostly around his ankles. Taking fluid pills as directed with no missed doses. Denies any associated chest pain. No meds taken prior to arrival for symptoms. No fever/chills. No abdominal pain, back pain, n/v, diaphoresis. Per EMS, O2 of 79% upon arrival and he was placed on [place] O2. 97% upon arrival to ED on RA after oxygen removed.    Past Medical History:  Diagnosis Date  . Anxiety   . Arthritis   . Atrial fibrillation, permanent ([place]) 01/07/2016  . Blood transfusion    "related to a surgery"  . BPH (benign prostatic hypertrophy) with urinary obstruction    s/p turp yrs ago  . Bradycardia    a. Amio d/c'd 08/2013; brady arrest 08/2013 after PCI >>> recurrent AF >>> Amiodarone restarted. b. Pacemaker being considered in 11/2014.  [name] CAD (coronary artery disease)    a. s/p MI and prior PCI of LAD;  b. LHC (08/2013):  prox LAD 60-70%, mid LAD stents ok, ostial lesion at Dx jailed by stent, mild CFX and RCA disesase >>>  PCI (09/08/13):  rotational atherectomy + Promus DES to prox LAD  . Cardiomyopathy ([place])    a. Echo (08/2013):  EF 30-35%, AS hypokinesis, Gr 1 diast dysfn,  mild MR, mild LAE >>> b. improved EF 50-55% by echo 8/15. c. EF down again by echo 12/2014 to 30-35% but 51% by nuc.  [name] Chronic lower back pain   . Chronic systolic CHF (congestive heart failure) ([place])   . CKD (chronic kidney disease), stage III ([place])    a. Per review of labs baseline Cr 1.1-1.3.  [name] DDD (degenerative disc disease)    chronic back pain  . Depression   . Diverticulitis of colon with bleeding    s/p sigmoid resection '88  . DJD (degenerative joint disease) hips and knees   s/p bilateral total replacements  . GERD (gastroesophageal reflux disease)    occ. take prevacid  . History of GI diverticular bleed april 2012   transfused blood and resolved without surgical intervention  . Hypertension   . Impaired hearing bilateral    only left hearing aid  . Impotence, organic    s/p penile prosthesis 1990's  . Incomplete bladder emptying   . LBBB (left bundle branch block)   . Meningioma ([place]) right -sided w/ right VI palsy   followed by dr Gaynell Face  . Mitral regurgitation    a. Mild-mod by echo 12/2014.  [name] Myocardial infarction [facility]) 1980's- medical intervention   "so mild I didn't know I'd had it"  . PAF (paroxysmal atrial fibrillation) (H[facility])    not  on coumadin due to hx of GI bleed.  . Prostate cancer ([place]) 11/30/13   Gleason 8, volume 22.14 cc  . Sleep apnea    non-compliant cpap    Patient Active Problem List   Diagnosis Date Noted  . Atrial fibrillation, permanent ([place]) 01/07/2016  . Acute on chronic combined systolic and diastolic ACC/AHA stage C congestive heart failure ([place]) 01/06/2016  . Orthostatic hypotension 02/06/2015  . Weakness 01/05/2015  . History of GI bleed 01/05/2015  . Hypokalemia 01/05/2015  . Chronic systolic CHF (congestive heart failure) ([place]) 01/03/2015  . Dyspnea 11/30/2014  . Fatigue 02/22/2014  . Coronary artery disease involving native coronary artery of native heart without angina pectoris   . Prostate cancer ([place])   . GI bleed  02/09/2014  . Chest pain 02/09/2014  . Malignant neoplasm of prostate ([place]) 12/15/2013  . Other malaise and fatigue 11/16/2013  . Chronic systolic heart failure ([place]) 10/12/2013  . Left bundle branch block 10/12/2013  . PAF (paroxysmal atrial fibrillation) ([place]) 09/05/2013  . Angina, class III ([place]) 09/05/2013  . R wrist hematoma following coronary angiography   . Bradycardia   . Coronary atherosclerosis of native coronary artery 09/03/2013  . Mitral valve disorder 07/10/2013  . Essential hypertension, benign 07/10/2013  . Cough 07/10/2013  . Nodular prostate without urinary obstruction 01/08/2011  . Hypertrophy of prostate without urinary obstruction and other lower urinary tract symptoms (LUTS) 01/08/2011  . Impotence of organic origin 01/08/2011    Past Surgical History:  Procedure Laterality Date  . APPENDECTOMY    . CARDIAC CATHETERIZATION  2007   noncritical cad (results w/ chart)  . CARDIOVERSION N/A 10/13/2015   Procedure: CARDIOVERSION;  Surgeon: Josue Hector, MD;  Location: [facility];  Service: Cardiovascular;  Laterality: N/A;  . CATARACT EXTRACTION W/ INTRAOCULAR LENS  IMPLANT, BILATERAL Bilateral ~ 2000  . CHOLECYSTECTOMY    . CLOSED REDUCTION HIP DISLOCATION Right "several"  . CORONARY ANGIOPLASTY WITH STENT PLACEMENT  08-03-08   drug-eluting stent x2 distal and mid lad  . EP IMPLANTABLE DEVICE N/A 01/31/2015   Procedure: BiV Pacemaker Insertion CRT-P;  Surgeon: Evans Lance, MD;  Location: [facility] CV LAB;  Service: Cardiovascular;  Laterality: N/A;  . EP IMPLANTABLE DEVICE N/A 02/07/2015   Procedure: Lead Revision/Repair;  Surgeon: Deboraha Sprang, MD;  Location: [facility] CV LAB;  Service: Cardiovascular;  Laterality: N/A;  . ESOPHAGOGASTRODUODENOSCOPY N/A 02/11/2014   Procedure: ESOPHAGOGASTRODUODENOSCOPY (EGD);  Surgeon: Cleotis Nipper, MD;  Location: [facility] ENDOSCOPY;  Service: Endoscopy;  Laterality: N/A;  . gamma knife radiation  2000   [facility] for  meningioma, last eval 2013- no change  . INGUINAL HERNIA REPAIR Bilateral   . INNER EAR SURGERY Right yrs ago   "trying to get my hearing back  . LEFT HEART CATHETERIZATION WITH CORONARY ANGIOGRAM N/A 09/04/2013   Procedure: LEFT HEART CATHETERIZATION WITH CORONARY ANGIOGRAM;  Surgeon: Jettie Booze, MD;  Location: [facility] CATH LAB;  Service: Cardiovascular;  Laterality: N/A;  . PENILE PROSTHESIS IMPLANT  1990's  . PERCUTANEOUS CORONARY ROTOBLATOR INTERVENTION (PCI-R) N/A 09/08/2013   Procedure: PERCUTANEOUS CORONARY ROTOBLATOR INTERVENTION (PCI-R);  Surgeon: Jettie Booze, MD;  Location: [facility] CATH LAB;  Service: Cardiovascular;  Laterality: N/A;  . PROSTATE BIOPSY  11/30/13   Gleason 8, vol 22.14 cc  . REVISION TOTAL KNEE ARTHROPLASTY Right   . SHOULDER OPEN ROTATOR CUFF REPAIR Left   . TOTAL HIP ARTHROPLASTY Right 03-25-08--  . TOTAL HIP ARTHROPLASTY Left 2005  .  TOTAL HIP REVISION Right 3-4 times  . TOTAL KNEE ARTHROPLASTY Right 2004  . TOTAL KNEE ARTHROPLASTY Left 1997  . TRANSURETHRAL RESECTION OF PROSTATE  "years ago"  . TRANSURETHRAL RESECTION OF PROSTATE  01/08/2011   Procedure: TRANSURETHRAL RESECTION OF THE PROSTATE (TURP);  Surgeon: Franchot Gallo;  Location: [facility];  Service: Urology;  Laterality: N/A;  GYRUS         Home Medications    Prior to Admission medications   Medication Sig Start Date End Date Taking? Authorizing Provider  acetaminophen (TYLENOL) 500 MG tablet Take 1,000 mg by mouth every 6 (six) hours as needed for headache (pain).     [provider]  brimonidine (ALPHAGAN) 0.15 % ophthalmic solution Place 1 drop into both eyes 2 (two) times daily. 08/11/15   [provider]  carvedilol (COREG) 3.125 MG tablet Take 1 tablet (3.125 mg total) by mouth 2 (two) times daily. 11/12/16 02/10/17  Josue Hector, MD  clopidogrel (PLAVIX) 75 MG tablet TAKE 1 TABLET EVERY DAY 11/02/16   Jettie Booze, MD  diazepam (VALIUM)  5 MG tablet Take 1 tablet (5 mg total) by mouth every 12 (twelve) hours as needed for anxiety. 01/07/16   Isaiah Serge, NP  dorzolamide-timolol (COSOPT) 22.3-6.8 MG/ML ophthalmic solution Place 1 drop into both eyes 2 (two) times daily.  11/12/13   [provider]  finasteride (PROSCAR) 5 MG tablet Take 1 tablet (5 mg total) by mouth daily. 01/08/16   Isaiah Serge, NP  Flaxseed, Linseed, (FLAX SEEDS PO) Take 15 mLs by mouth daily. Grind up seeds and take with honey    [provider]  fluticasone (FLONASE) 50 MCG/ACT nasal spray Place 2 sprays into both nostrils daily as needed for allergies.  12/08/13   [provider]  Multiple Vitamins-Minerals (EMERGEN-C VITAMIN C) PACK Take 1,000 mg by mouth daily after lunch.    [provider]  Multiple Vitamins-Minerals (PRESERVISION AREDS 2) CAPS Take 1 capsule by mouth daily after lunch.    [provider]  nitroGLYCERIN (NITROSTAT) 0.4 MG SL tablet Place 1 tablet (0.4 mg total) under the tongue every 5 (five) minutes as needed for chest pain. 09/21/13   Richardson Dopp T, PA-C  Omega-3 Fatty Acids (FISH OIL PO) Take 1 capsule by mouth daily after lunch.    [provider]  potassium chloride (K-DUR) 10 MEQ tablet Take 5 mEq by mouth daily. 07/03/16 Pt reported taking 1 tablet (10 mEq) daily.    [provider]  potassium chloride (K-DUR,KLOR-CON) 10 MEQ tablet TAKE 1 TABLET EVERY DAY AS NEEDED. WHEN YOU TAKE YOUR FUROSEMIDE. 11/02/16   Jettie Booze, MD  Probiotic Product (PROBIOTIC PO) Take 1 tablet by mouth 2 (two) times daily.     [provider]  psyllium (METAMUCIL) 58.6 % packet Take 1 packet by mouth daily with supper. Mix in 8 oz liquid and drink    [provider]  tamsulosin (FLOMAX) 0.4 MG CAPS capsule Take 1 capsule (0.4 mg total) by mouth daily after supper. 01/07/16   Isaiah Serge, NP  torsemide (DEMADEX) 20 MG tablet Take 1 tablet by mouth daily. 05/02/16    [provider]  traMADol (ULTRAM) 50 MG tablet Take 50 mg as needed by mouth. 03/15/15   [provider]    Family History Family History  Problem Relation Age of Onset  . Heart disease Mother   . Heart attack Mother   . Emphysema Father   .  Cancer Brother        liver cancer  . Cancer Unknown     Social History Social History   Tobacco Use  . Smoking status: Former Smoker    Packs/day: 1.00    Years: 14.00    Pack years: 14.00    Types: Cigarettes    Last attempt to quit: 01/05/1959    Years since quitting: 58.6  . Smokeless tobacco: Never Used  Substance Use Topics  . Alcohol use: Yes    Comment: 02/09/2014 "I'm not drinking anymore"  . Drug use: No     Allergies   Omeprazole magnesium; Pantoprazole sodium; Codeine; Omeprazole; and Warfarin sodium   Review of Systems Review of Systems  Respiratory: Positive for cough and shortness of breath.   Cardiovascular: Positive for leg swelling. Negative for chest pain and palpitations.  All other systems reviewed and are negative.    Physical Exam Updated Vital Signs BP 118/81   Pulse 77   Temp 97.7 F (36.5 C)   Resp 16   Ht 5\' 7"  (1.702 m)   Wt 80.7 kg (178 lb)   SpO2 97%   BMI 27.88 kg/m   Physical Exam  Constitutional: He is oriented to person, place, and time. He appears well-developed and well-nourished. No distress.  HENT:  Head: Normocephalic and atraumatic.  Cardiovascular: Normal rate, regular rhythm and normal heart sounds.  No murmur heard. Pulmonary/Chest: Effort normal and breath sounds normal. No stridor. No respiratory distress. He has no wheezes. He has no rales.  Abdominal: Soft. He exhibits no distension. There is no tenderness.  Musculoskeletal:  Trace pedal edema.  No calf tenderness.  Neurological: He is alert and oriented to person, place, and time.  Skin: Skin is warm and dry.  Nursing note and vitals reviewed.    ED Treatments / Results  Labs (all labs  ordered are listed, but only abnormal results are displayed) Labs Reviewed  BASIC METABOLIC PANEL - Abnormal; Notable for the following components:      Result Value   BUN 21 (*)    GFR calc non Af Amer 50 (*)    GFR calc Af Amer 58 (*)    All other components within normal limits  CBC - Abnormal; Notable for the following components:   Platelets 124 (*)    All other components within normal limits  BRAIN NATRIURETIC PEPTIDE - Abnormal; Notable for the following components:   B Natriuretic Peptide 514.2 (*)    All other components within normal limits  TROPONIN I    EKG EKG Interpretation  Date/Time:  Saturday August 10 2017 18:38:45 EDT Ventricular Rate:  75 PR Interval:    QRS Duration: 158 QT Interval:  470 QTC Calculation: 525 R Axis:   101 Text Interpretation:  Atrial fibrillation Multiple ventricular premature complexes IVCD, consider atypical RBBB Lateral infarct, old new pvcs from prior 11/17 Confirmed by Aletta Edouard [phone]) on 08/10/2017 6:42:17 PM   Radiology Dg Chest 2 View  Result Date: 08/10/2017 CLINICAL DATA:  Shortness of breath for 3 days. EXAM: CHEST - 2 VIEW COMPARISON:  Chest x-rays dated 01/06/2016 and 11/02/2015. FINDINGS: Stable cardiomegaly. LEFT chest wall pacemaker/ICD apparatus appears stable in positioning. Atherosclerotic changes again noted at the aortic arch. Lungs are clear. No pleural effusion or pneumothorax seen. No acute or suspicious osseous finding. IMPRESSION: 1. No active cardiopulmonary disease. No evidence of pneumonia, pleural effusion or pulmonary edema. 2. Stable cardiomegaly. 3. Aortic atherosclerosis. Electronically Signed   By: Cherlynn Kaiser  Enriqueta Shutter M.D.   On: 08/10/2017 20:23    Procedures Procedures (including critical care time)  Medications Ordered in ED Medications  furosemide (LASIX) injection 40 mg (40 mg Intravenous Given 08/10/17 2125)     Initial Impression / Assessment and Plan / ED Course  I have reviewed the triage  vital signs and the nursing notes.  Pertinent labs & imaging results that were available during my care of the patient were reviewed by me and considered in my medical decision making (see chart for details).  Clinical Course as of Aug 11 100  Sat Aug 11, 2250  [id number] 84 year old male complaining of increased shortness of breath over the last 3 days more so when he lays down flat.  Currently states he is feeling better here and his sats are fine on room air.  Rechecking some screening labs EKG BNP and chest x-ray.  He is hoping to be able to be discharged.   [MB]    Clinical Course User Index [MB] Hayden Rasmussen, MD   [name] is a 84 y.o. male who presents to ED for shortness of breath over the last 3 days associated with mild lower extremity swelling.,  Patient is afebrile, hemodynamically stable with clear lung exam and trace pedal edema.  Oxygenating well on room air on initial exam.  Chest x-ray with no acute findings.  No pulmonary edema.  EKG with no acute ischemic changes.  Troponin negative.  BNP elevated at 514.  Dose of Lasix given.  He reports urinating a good bit and feels improved.  He ambulated in the ED and reports feeling much better.  He did not feel as if he got short of breath.  Per nursing staff, he maintain oxygen of around 96% on room air for the majority of the walk, although when walking back towards the room, he did desat down to 85%.  We discussed desaturation and recommendation of admission given desat with ambulation. He states that he did not feel short of breath walking and feels much better since given dose of lasix. He would very much like to go home.  He states that he can call his cardiologist and get an appointment on Monday or Tuesday.  We discussed risks/benefits of home vs. Admission. Patient would very much like to go home. Reasons to return to ER were discussed at length. Patient discharged in stable condition with strict return precautions and  instructions to follow up with cardiology.  Patient seen by and discussed with Dr. Melina Copa who agrees with treatment plan.    Final Clinical Impressions(s) / ED Diagnoses   Final diagnoses:  Acute on chronic congestive heart failure, unspecified heart failure type [facility])    ED Discharge Orders    None       Taziyah Iannuzzi, Ozella Almond, PA-C 08/11/17 0103    Hayden Rasmussen, MD 08/12/17 1136

## 2017-08-10 NOTE — ED Notes (Signed)
Lasix given 

## 2017-08-10 NOTE — ED Notes (Signed)
To x-ray

## 2017-08-10 NOTE — ED Notes (Signed)
Pt verbalizes understanding of d/c instructions. Pt taken to lobby in wheelchair at d/c with all belongings and with family.   

## 2017-08-10 NOTE — ED Triage Notes (Signed)
The pt arrived by gems from home  The pt has had sob for 3days no feet and ankle edema  sats when ems picked him up was in the 7s ??  On arrival alert no visible sob.   02 removed on arrival here.  sats holding thus far.

## 2017-08-12 ENCOUNTER — Encounter: Payer: Self-pay | Admitting: *Deleted

## 2017-08-12 ENCOUNTER — Telehealth: Payer: Self-pay | Admitting: Internal Medicine

## 2017-08-12 DIAGNOSIS — R3912 Poor urinary stream: Secondary | ICD-10-CM | POA: Diagnosis not present

## 2017-08-12 DIAGNOSIS — N401 Enlarged prostate with lower urinary tract symptoms: Secondary | ICD-10-CM | POA: Diagnosis not present

## 2017-08-12 DIAGNOSIS — C61 Malignant neoplasm of prostate: Secondary | ICD-10-CM | POA: Diagnosis not present

## 2017-08-12 NOTE — Telephone Encounter (Signed)
Chart reviewed and pt was seen in ED for shortness of breath. Felt better after lasix. Plan was to follow up with cardiology on Monday or Tuesday.  I spoke with Dr. Heron Nay and she can see pt on Tuesday. I spoke with pt. He is not having any shortness of breath and is feeling fine.  States BP has been going up and down at times.  I scheduled him to see Dr. Radford Pax on June 18,2019 at 1:20

## 2017-08-12 NOTE — Telephone Encounter (Signed)
New Message:      Pt is calling and states the ER told him to f/u with [place] on today or tomorrow with Lovena Le.

## 2017-08-13 ENCOUNTER — Ambulatory Visit: Payer: Medicare HMO | Admitting: Cardiology

## 2017-08-13 ENCOUNTER — Ambulatory Visit (INDEPENDENT_AMBULATORY_CARE_PROVIDER_SITE_OTHER): Payer: Self-pay

## 2017-08-13 ENCOUNTER — Encounter: Payer: Self-pay | Admitting: Cardiology

## 2017-08-13 ENCOUNTER — Encounter (INDEPENDENT_AMBULATORY_CARE_PROVIDER_SITE_OTHER): Payer: Self-pay

## 2017-08-13 ENCOUNTER — Telehealth: Payer: Self-pay

## 2017-08-13 VITALS — BP 108/61 | HR 44 | Ht 67.0 in | Wt 179.4 lb

## 2017-08-13 DIAGNOSIS — I2510 Atherosclerotic heart disease of native coronary artery without angina pectoris: Secondary | ICD-10-CM

## 2017-08-13 DIAGNOSIS — I10 Essential (primary) hypertension: Secondary | ICD-10-CM | POA: Diagnosis not present

## 2017-08-13 DIAGNOSIS — I5022 Chronic systolic (congestive) heart failure: Secondary | ICD-10-CM | POA: Diagnosis not present

## 2017-08-13 DIAGNOSIS — I482 Chronic atrial fibrillation: Secondary | ICD-10-CM | POA: Diagnosis not present

## 2017-08-13 DIAGNOSIS — R0602 Shortness of breath: Secondary | ICD-10-CM | POA: Diagnosis not present

## 2017-08-13 DIAGNOSIS — I4821 Permanent atrial fibrillation: Secondary | ICD-10-CM

## 2017-08-13 DIAGNOSIS — Z950 Presence of cardiac pacemaker: Secondary | ICD-10-CM

## 2017-08-13 NOTE — Progress Notes (Signed)
EPIC Encounter for ICM Monitoring  Patient Name: [name] is a 84 y.o. male Date: 08/13/2017 Primary Care Physican: Wenda Low, MD Primary Cardiologist:[place]  Electrophysiologist:Taylor  Dry Weight:not weighing  Attempted patient call and left message to return call.  Transmission reviewed.  Impedance returned to normal after ER visit 08/10/2017 for SOB, low BP, ankle swelling and difficulty breathing at night.  Patient had tried taking extra Torsemide at home prior to ER visit.    Thoracic impedance returned to normal after ER visit but was abnormal suggesting fluid accumulation from 08/02/2017 - 08/11/2017.  Prescribed dosage: Torsemide 20 mg 1 tablet daily. Potassium 10 mEq take 1 tablet every day as needed when taking Furosemide.  Labs: 07/06/2016 Creatinine 1.53, BUN 23, Potassium 3.7, Sodium 137, EGFR 40-47 06/07/2016 Creatinine 1.10, BUN 17, Potassium 3.4, Sodium 135, EGFR 60-69 01/07/2016 Creatinine 1.09, BUN 8, Potassium 3.8, Sodium 135, EGFR 59->60  01/06/2016 Creatinine 1.10, BUN 15, Potassium 4.5, Sodium 130, EGFR 58->60  11/02/2015 Creatinine 1.13, BUN 10, Potassium 4.1, Sodium 124, EGFR 56->60  10/13/2015 Creatinine 1.30, BUN 17, Potassium 4.4, Sodium 137  05/19/2015 Creatinine 1.54, BUN 20, Potassium 4.4, Sodium 137, EGFR 39-45  Recommendations: NONE - Unable to reach.  Follow-up plan: ICM clinic phone appointment on 08/22/2017 to recheck fluid levels.  Office appointment scheduled 08/13/2017 with Dr. Radford Pax for post ER visit follow up and 08/28/2017 with [name].   Copy of ICM check sent to Dr. Lovena Le and Dr. Irish Lack.   3 month ICM trend: 08/13/2017    1 Year ICM trend:       Rosalene Billings, RN 08/13/2017 7:57 AM

## 2017-08-13 NOTE — Telephone Encounter (Signed)
Remote ICM transmission received.  Attempted call to patient and left message to return call. 

## 2017-08-13 NOTE — Patient Instructions (Addendum)
Medication Instructions:  Your physician recommends that you continue on your current medications as directed. Please refer to the Current Medication list given to you today.  If you need a refill on your cardiac medications, please contact your pharmacy first.  Labwork: None ordered   Testing/Procedures: Your physician has requested that you have an echocardiogram. Echocardiography is a painless test that uses sound waves to create images of your heart. It provides your doctor with information about the size and shape of your heart and how well your heart's chambers and valves are working. This procedure takes approximately one hour. There are no restrictions for this procedure.  Your physician has requested that you have a lexiscan myoview. For further information please visit [email or web address]. Please follow instruction sheet, as given.  Follow-Up: Your physician recommends that you schedule a follow-up appointment in: 3 months with Dr. Irish Lack    Any Other Special Instructions Will Be Listed Below (If Applicable).  Low-Sodium Eating Plan Sodium, which is an element that makes up salt, helps you maintain a healthy balance of fluids in your body. Too much sodium can increase your blood pressure and cause fluid and waste to be held in your body. Your health care provider or dietitian may recommend following this plan if you have high blood pressure (hypertension), kidney disease, liver disease, or heart failure. Eating less sodium can help lower your blood pressure, reduce swelling, and protect your heart, liver, and kidneys. What are tips for following this plan? General guidelines  Most people on this plan should limit their sodium intake to 1,500-2,000 mg (milligrams) of sodium each day. Reading food labels  The Nutrition Facts label lists the amount of sodium in one serving of the food. If you eat more than one serving, you must multiply the listed amount of sodium by the number  of servings.  Choose foods with less than 140 mg of sodium per serving.  Avoid foods with 300 mg of sodium or more per serving. Shopping  Look for lower-sodium products, often labeled as "low-sodium" or "no salt added."  Always check the sodium content even if foods are labeled as "unsalted" or "no salt added".  Buy fresh foods. ? Avoid canned foods and premade or frozen meals. ? Avoid canned, cured, or processed meats  Buy breads that have less than 80 mg of sodium per slice. Cooking  Eat more home-cooked food and less restaurant, buffet, and fast food.  Avoid adding salt when cooking. Use salt-free seasonings or herbs instead of table salt or sea salt. Check with your health care provider or pharmacist before using salt substitutes.  Cook with plant-based oils, such as canola, sunflower, or olive oil. Meal planning  When eating at a restaurant, ask that your food be prepared with less salt or no salt, if possible.  Avoid foods that contain MSG (monosodium glutamate). MSG is sometimes added to [place] food, bouillon, and some canned foods. What foods are recommended? The items listed may not be a complete list. Talk with your dietitian about what dietary choices are best for you. Grains Low-sodium cereals, including oats, puffed wheat and rice, and shredded wheat. Low-sodium crackers. Unsalted rice. Unsalted pasta. Low-sodium bread. Whole-grain breads and whole-grain pasta. Vegetables Fresh or frozen vegetables. "No salt added" canned vegetables. "No salt added" tomato sauce and paste. Low-sodium or reduced-sodium tomato and vegetable juice. Fruits Fresh, frozen, or canned fruit. Fruit juice. Meats and other protein foods Fresh or frozen (no salt added) meat, poultry, seafood, and fish. Low-sodium  canned tuna and salmon. Unsalted nuts. Dried peas, beans, and lentils without added salt. Unsalted canned beans. Eggs. Unsalted nut butters. Dairy Milk. Soy milk. Cheese that is  naturally low in sodium, such as ricotta cheese, fresh mozzarella, or Swiss cheese Low-sodium or reduced-sodium cheese. Cream cheese. Yogurt. Fats and oils Unsalted butter. Unsalted margarine with no trans fat. Vegetable oils such as canola or olive oils. Seasonings and other foods Fresh and dried herbs and spices. Salt-free seasonings. Low-sodium mustard and ketchup. Sodium-free salad dressing. Sodium-free light mayonnaise. Fresh or refrigerated horseradish. Lemon juice. Vinegar. Homemade, reduced-sodium, or low-sodium soups. Unsalted popcorn and pretzels. Low-salt or salt-free chips. What foods are not recommended? The items listed may not be a complete list. Talk with your dietitian about what dietary choices are best for you. Grains Instant hot cereals. Bread stuffing, pancake, and biscuit mixes. Croutons. Seasoned rice or pasta mixes. Noodle soup cups. Boxed or frozen macaroni and cheese. Regular salted crackers. Self-rising flour. Vegetables Sauerkraut, pickled vegetables, and relishes. Olives. [place] fries. Onion rings. Regular canned vegetables (not low-sodium or reduced-sodium). Regular canned tomato sauce and paste (not low-sodium or reduced-sodium). Regular tomato and vegetable juice (not low-sodium or reduced-sodium). Frozen vegetables in sauces. Meats and other protein foods Meat or fish that is salted, canned, smoked, spiced, or pickled. Bacon, ham, sausage, hotdogs, corned beef, chipped beef, packaged lunch meats, salt pork, jerky, pickled herring, anchovies, regular canned tuna, sardines, salted nuts. Dairy Processed cheese and cheese spreads. Cheese curds. Blue cheese. Feta cheese. String cheese. Regular cottage cheese. Buttermilk. Canned milk. Fats and oils Salted butter. Regular margarine. Ghee. Bacon fat. Seasonings and other foods Onion salt, garlic salt, seasoned salt, table salt, and sea salt. Canned and packaged gravies. Worcestershire sauce. Tartar sauce. Barbecue sauce.  Teriyaki sauce. Soy sauce, including reduced-sodium. Steak sauce. Fish sauce. Oyster sauce. Cocktail sauce. Horseradish that you find on the shelf. Regular ketchup and mustard. Meat flavorings and tenderizers. Bouillon cubes. Hot sauce and Tabasco sauce. Premade or packaged marinades. Premade or packaged taco seasonings. Relishes. Regular salad dressings. Salsa. Potato and tortilla chips. Corn chips and puffs. Salted popcorn and pretzels. Canned or dried soups. Pizza. Frozen entrees and pot pies. Summary  Eating less sodium can help lower your blood pressure, reduce swelling, and protect your heart, liver, and kidneys.  Most people on this plan should limit their sodium intake to 1,500-2,000 mg (milligrams) of sodium each day.  Canned, boxed, and frozen foods are high in sodium. Restaurant foods, fast foods, and pizza are also very high in sodium. You also get sodium by adding salt to food.  Try to cook at home, eat more fresh fruits and vegetables, and eat less fast food, canned, processed, or prepared foods. This information is not intended to replace advice given to you by your health care provider. Make sure you discuss any questions you have with your health care provider. Document Released: 08/04/2001 Document Revised: 02/06/2016 Document Reviewed: 02/06/2016 Elsevier Interactive Patient Education  2018 [employer].   Thank you for choosing [facility] [place]  If you need a refill on your cardiac medications before your next appointment, please call your pharmacy.

## 2017-08-13 NOTE — Progress Notes (Signed)
Cardiology Office Note:    Date:  08/13/2017   ID:  [name], DOB [birth year], MRN [id number]  PCP:  Nathan Low, MD  Cardiologist:  No primary care provider on file.    Referring MD: Nathan Low, MD   Chief Complaint  Patient presents with  . Coronary Artery Disease  . Hypertension  . Atrial Fibrillation  . Shortness of Breath  . Congestive Heart Failure    History of Present Illness:    [name] is a 84 y.o. male with a hx of prior AFib, CAD  complex PCI of his LAD in July 2015 involving rotational atherectomy). and mitral regurgitation.  Prior to the intervention, his ejection fraction was in the 30-35% range. Echocardiogram a month after the intervention showed an improvement in his EF from 50-55%.He then developed tachybrady syndrome and had a BiV - pacer placed and then was found to have AFib. He had GI bleeding with Warfarin. He did not tolerate Eliquis. Amio was started to maintain NSR, but he reverted to AFib even after DCCV. Most recently, in 9/17, Amio and Warfarin were stopped and Plavix was restarted since he had taken this without bleeding issues in the past.  He was seen in the emergency room on 08/10/2017 complaining of hypotension, fatigue, shortness of breath, orthopnea and noted that his heart rate was slow.  Had a cough and some lower extremity his ankles.  He had not missed any doses of his diuretic.  EKG showed atrial fibrillation with multiple PVCs.  Troponin was negative and BNP was mildly elevated at 514.  Chest x-ray showed no acute findings.  EKG showed no ischemic changes.  He was given Lasix 40 mg IV he did not want to be admitted to the hospital and therefore was discharged home from the ER with instructions to call to be seen in the office.  He did have oxygen saturations as [name] as 85% briefly while walking but mainly was 96% on room air.  He is here today for followup from his ER visit.  He is doing much better today and his shortness  of breath is completely resolved.  He has not had any further lower extremity edema either.  He denies any chest pain or pressure, DOE, PND, orthopnea,  palpitations or syncope.  He did have some dizziness when he was having [name] blood pressures.  He is compliant with his meds and is tolerating meds with no SE.      Past Medical History:  Diagnosis Date  . Anxiety   . Arthritis   . Atrial fibrillation, permanent ([place]) 01/07/2016  . Blood transfusion    "related to a surgery"  . BPH (benign prostatic hypertrophy) with urinary obstruction    s/p turp yrs ago  . Bradycardia    a. Amio d/c'd 08/2013; brady arrest 08/2013 after PCI >>> recurrent AF >>> Amiodarone restarted. b. Pacemaker being considered in 11/2014.  [name] CAD (coronary artery disease)    a. s/p MI and prior PCI of LAD;  b. LHC (08/2013):  prox LAD 60-70%, mid LAD stents ok, ostial lesion at Dx jailed by stent, mild CFX and RCA disesase >>>  PCI (09/08/13):  rotational atherectomy + Promus DES to prox LAD  . Cardiomyopathy ([place])    a. Echo (08/2013):  EF 30-35%, AS hypokinesis, Gr 1 diast dysfn, mild MR, mild LAE >>> b. improved EF 50-55% by echo 8/15. c. EF down again by echo 12/2014 to 30-35% but 51% by nuc.  [name]  Chronic lower back pain   . Chronic systolic CHF (congestive heart failure) ([place])   . CKD (chronic kidney disease), stage III ([place])    a. Per review of labs baseline Cr 1.1-1.3.  [name] DDD (degenerative disc disease)    chronic back pain  . Depression   . Diverticulitis of colon with bleeding    s/p sigmoid resection '88  . DJD (degenerative joint disease) hips and knees   s/p bilateral total replacements  . GERD (gastroesophageal reflux disease)    occ. take prevacid  . History of GI diverticular bleed april 2012   transfused blood and resolved without surgical intervention  . Hypertension   . Impaired hearing bilateral    only left hearing aid  . Impotence, organic    s/p penile prosthesis 1990's  . Incomplete bladder  emptying   . LBBB (left bundle branch block)   . Meningioma ([place]) right -sided w/ right VI palsy   followed by dr Gaynell Face  . Mitral regurgitation    a. Mild-mod by echo 12/2014.  [name] Myocardial infarction [facility]) 1980's- medical intervention   "so mild I didn't know I'd had it"  . PAF (paroxysmal atrial fibrillation) (HCC)    not on coumadin due to hx of GI bleed.  . Prostate cancer ([place]) 11/30/13   Gleason 8, volume 22.14 cc  . Sleep apnea    non-compliant cpap    Past Surgical History:  Procedure Laterality Date  . APPENDECTOMY    . CARDIAC CATHETERIZATION  2007   noncritical cad (results w/ chart)  . CARDIOVERSION N/A 10/13/2015   Procedure: CARDIOVERSION;  Surgeon: Josue Hector, MD;  Location: [place];  Service: Cardiovascular;  Laterality: N/A;  . CATARACT EXTRACTION W/ INTRAOCULAR LENS  IMPLANT, BILATERAL Bilateral ~ 2000  . CHOLECYSTECTOMY    . CLOSED REDUCTION HIP DISLOCATION Right "several"  . CORONARY ANGIOPLASTY WITH STENT PLACEMENT  08-03-08   drug-eluting stent x2 distal and mid lad  . EP IMPLANTABLE DEVICE N/A 01/31/2015   Procedure: BiV Pacemaker Insertion CRT-P;  Surgeon: Evans Lance, MD;  Location: [place] CV LAB;  Service: Cardiovascular;  Laterality: N/A;  . EP IMPLANTABLE DEVICE N/A 02/07/2015   Procedure: Lead Revision/Repair;  Surgeon: Deboraha Sprang, MD;  Location: [place] CV LAB;  Service: Cardiovascular;  Laterality: N/A;  . ESOPHAGOGASTRODUODENOSCOPY N/A 02/11/2014   Procedure: ESOPHAGOGASTRODUODENOSCOPY (EGD);  Surgeon: Cleotis Nipper, MD;  [facility] ENDOSCOPY;  Service: Endoscopy;  Laterality: N/A;  . gamma knife radiation  2000   [facility] for meningioma, last eval 2013- no change  . INGUINAL HERNIA REPAIR Bilateral   . INNER EAR SURGERY Right yrs ago   "trying to get my hearing back  . LEFT HEART CATHETERIZATION WITH CORONARY ANGIOGRAM N/A 09/04/2013   Procedure: LEFT HEART CATHETERIZATION WITH CORONARY ANGIOGRAM;  Surgeon: Jettie Booze, MD;  Location: [facility] CATH LAB;  Service: Cardiovascular;  Laterality: N/A;  . PENILE PROSTHESIS IMPLANT  1990's  . PERCUTANEOUS CORONARY ROTOBLATOR INTERVENTION (PCI-R) N/A 09/08/2013   Procedure: PERCUTANEOUS CORONARY ROTOBLATOR INTERVENTION (PCI-R);  Surgeon: Jettie Booze, MD;  Location: [facility] CATH LAB;  Service: Cardiovascular;  Laterality: N/A;  . PROSTATE BIOPSY  11/30/13   Gleason 8, vol 22.14 cc  . REVISION TOTAL KNEE ARTHROPLASTY Right   . SHOULDER OPEN ROTATOR CUFF REPAIR Left   . TOTAL HIP ARTHROPLASTY Right 03-25-08--  . TOTAL HIP ARTHROPLASTY Left 2005  . TOTAL HIP REVISION Right 3-4 times  . TOTAL KNEE ARTHROPLASTY Right  2004  . TOTAL KNEE ARTHROPLASTY Left 1997  . TRANSURETHRAL RESECTION OF PROSTATE  "years ago"  . TRANSURETHRAL RESECTION OF PROSTATE  01/08/2011   Procedure: TRANSURETHRAL RESECTION OF THE PROSTATE (TURP);  Surgeon: Franchot Gallo;  Location: [place];  Service: Urology;  Laterality: N/A;  GYRUS     Current Medications: Current Meds  Medication Sig  . acetaminophen (TYLENOL) 500 MG tablet Take 1,000 mg by mouth every 6 (six) hours as needed for headache (pain).   . brimonidine (ALPHAGAN) 0.15 % ophthalmic solution Place 1 drop into both eyes 2 (two) times daily.  . carvedilol (COREG) 3.125 MG tablet Take 1 tablet (3.125 mg total) by mouth 2 (two) times daily.  . clopidogrel (PLAVIX) 75 MG tablet TAKE 1 TABLET EVERY DAY  . diazepam (VALIUM) 5 MG tablet Take 1 tablet (5 mg total) by mouth every 12 (twelve) hours as needed for anxiety.  . dorzolamide-timolol (COSOPT) 22.3-6.8 MG/ML ophthalmic solution Place 1 drop into both eyes 2 (two) times daily.   . finasteride (PROSCAR) 5 MG tablet Take 1 tablet (5 mg total) by mouth daily.  . Flaxseed, Linseed, (FLAX SEEDS PO) Take 15 mLs by mouth daily. Grind up seeds and take with honey  . fluticasone (FLONASE) 50 MCG/ACT nasal spray Place 2 sprays into both nostrils daily as needed for  allergies.   . Multiple Vitamins-Minerals (EMERGEN-C VITAMIN C) PACK Take 1,000 mg by mouth daily after lunch.  . Multiple Vitamins-Minerals (PRESERVISION AREDS 2) CAPS Take 1 capsule by mouth daily after lunch.  . nitroGLYCERIN (NITROSTAT) 0.4 MG SL tablet Place 1 tablet (0.4 mg total) under the tongue every 5 (five) minutes as needed for chest pain.  . Omega-3 Fatty Acids (FISH OIL PO) Take 1 capsule by mouth daily after lunch.  . potassium chloride (K-DUR,KLOR-CON) 10 MEQ tablet TAKE 1 TABLET EVERY DAY AS NEEDED. WHEN YOU TAKE YOUR FUROSEMIDE.  [name] Probiotic Product (PROBIOTIC PO) Take 1 tablet by mouth 2 (two) times daily.   . psyllium (METAMUCIL) 58.6 % packet Take 1 packet by mouth daily with supper. Mix in 8 oz liquid and drink  . tamsulosin (FLOMAX) 0.4 MG CAPS capsule Take 1 capsule (0.4 mg total) by mouth daily after supper.  . torsemide (DEMADEX) 20 MG tablet Take 1 tablet by mouth daily.  . traMADol (ULTRAM) 50 MG tablet Take 50 mg as needed by mouth.     Allergies:   Omeprazole magnesium; Pantoprazole sodium; Codeine; Omeprazole; and Warfarin sodium   Social History   Socioeconomic History  . Marital status: Widowed    Spouse name: Not on file  . Number of children: Not on file  . Years of education: Not on file  . Highest education level: Not on file  Occupational History  . Not on file  Social Needs  . Financial resource strain: Not on file  . Food insecurity:    Worry: Not on file    Inability: Not on file  . Transportation needs:    Medical: Not on file    Non-medical: Not on file  Tobacco Use  . Smoking status: Former Smoker    Packs/day: 1.00    Years: 14.00    Pack years: 14.00    Types: Cigarettes    Last attempt to quit: 01/05/1959    Years since quitting: 58.6  . Smokeless tobacco: Never Used  Substance and Sexual Activity  . Alcohol use: Yes    Comment: 02/09/2014 "I'm not drinking anymore"  . Drug  use: No  . Sexual activity: Never  Lifestyle  .  Physical activity:    Days per week: Not on file    Minutes per session: Not on file  . Stress: Not on file  Relationships  . Social connections:    Talks on phone: Not on file    Gets together: Not on file    Attends religious service: Not on file    Active member of club or organization: Not on file    Attends meetings of clubs or organizations: Not on file    Relationship status: Not on file  Other Topics Concern  . Not on file  Social History Narrative  . Not on file     Family History: The patient's family history includes Cancer in his brother and unknown relative; Emphysema in his father; Heart attack in his mother; Heart disease in his mother.  ROS:   Please see the history of present illness.    ROS  All other systems reviewed and negative.   EKGs/Labs/Other Studies Reviewed:    The following studies were reviewed today: none  EKG:  EKG is not ordered today.    Recent Labs: 08/10/2017: B Natriuretic Peptide 514.2; BUN 21; Creatinine, Ser 1.24; Hemoglobin 14.0; Platelets 124; Potassium 4.0; Sodium 139   Recent Lipid Panel    Component Value Date/Time   CHOL 157 12/31/2016 1012   TRIG 145 12/31/2016 1012   HDL 35 (L) 12/31/2016 1012   CHOLHDL 4.5 12/31/2016 1012   LDLCALC 93 12/31/2016 1012    Physical Exam:    VS:  BP 108/61   Pulse (!) 44   Ht 5\' 7"  (1.702 m)   Wt 179 lb 6.4 oz (81.4 kg)   SpO2 94%   BMI 28.10 kg/m     Wt Readings from Last 3 Encounters:  08/13/17 179 lb 6.4 oz (81.4 kg)  08/10/17 178 lb (80.7 kg)  12/31/16 178 lb 12.8 oz (81.1 kg)     GEN:  Well nourished, well developed in no acute distress HEENT: Normal NECK: No JVD; No carotid bruits LYMPHATICS: No lymphadenopathy CARDIAC: RRR, no murmurs, rubs, gallops RESPIRATORY:  Clear to auscultation without rales, wheezing or rhonchi  ABDOMEN: Soft, non-tender, non-distended MUSCULOSKELETAL:  No edema; No deformity  SKIN: Warm and dry NEUROLOGIC:  Alert and oriented x  3 PSYCHIATRIC:  Normal affect   ASSESSMENT:    1. Atrial fibrillation, permanent ([place])   2. Essential hypertension, benign   3. Atherosclerosis of native coronary artery of native heart without angina pectoris   4. Chronic systolic heart failure (HCC)    PLAN:    In order of problems listed above:  1.  Permanent atrial fibrillation -his rate is well controlled on exam today.  He will continue on carvedilol 3.125 mg twice daily.  He is not on long-term anticoagulation as he was intolerant to Eliquis and had GI bleeding with warfarin.  His device was interrogated in our office today since he thought his heart rate was [name] during the episode of shortness of breath.  Device showed normal pacer function.  2.  Hypertension -BP is well controlled on exam today.  He will continue on carvedilol 3.125 mg twice daily.  3.  ASCHD - s/p complex PCI of his LAD in July 2015 involving rotational atherectomy.  He has not had any anginal chest pain but did have some shortness of breath likely related to volume overload.  He has not had any type of ischemic work-up since  his stress test in 2016.  I will get a Lexiscan Myoview to rule out ischemia.  I am also can a check a 2D echocardiogram to make sure his LV function still is normal.  He will continue on Plavix 75 mg daily, beta-blocker and statin.  He is not on aspirin due to GI bleeding in the past.  4.  Chronic systolic heart failure - he recently had a mild bout of CHF requiring ER visit.  He received IV Lasix and since then has felt much better.  He has not had any further shortness of breath or dyspnea on exertion.  He has not had any PND orthopnea.  On exam his lungs are clear and he has no lower extremity edema.  His EF had normalized on last echo in 2016 but I will repeat this to make sure his LV function has not declined.  In talking with him I suspect that his volume overload was likely due to dietary indiscretion with sodium.  I am going to give him a  handout on a [name]-sodium diet today that he can follow.  He will continue on carvedilol 3.125 mg twice daily and torsemide 20 mg daily.  He will follow-up with Dr. Irish Lack in 3 months.  Medication Adjustments/Labs and Tests Ordered: Current medicines are reviewed at length with the patient today.  Concerns regarding medicines are outlined above.  No orders of the defined types were placed in this encounter.  No orders of the defined types were placed in this encounter.   Signed, Fransico Him, MD  08/13/2017 2:10 PM    [place]

## 2017-08-19 ENCOUNTER — Telehealth (HOSPITAL_COMMUNITY): Payer: Self-pay | Admitting: *Deleted

## 2017-08-19 NOTE — Telephone Encounter (Signed)
Left message on voicemail in reference to upcoming appointment scheduled for 08/23/17. Phone number given for a call back so details instructions can be given. Terriah Reggio, Ranae Palms

## 2017-08-22 ENCOUNTER — Ambulatory Visit (INDEPENDENT_AMBULATORY_CARE_PROVIDER_SITE_OTHER): Payer: Medicare HMO

## 2017-08-22 ENCOUNTER — Encounter: Payer: Self-pay | Admitting: Internal Medicine

## 2017-08-22 DIAGNOSIS — I5022 Chronic systolic (congestive) heart failure: Secondary | ICD-10-CM

## 2017-08-22 DIAGNOSIS — Z950 Presence of cardiac pacemaker: Secondary | ICD-10-CM

## 2017-08-23 ENCOUNTER — Other Ambulatory Visit: Payer: Self-pay

## 2017-08-23 ENCOUNTER — Telehealth: Payer: Self-pay

## 2017-08-23 ENCOUNTER — Ambulatory Visit (HOSPITAL_COMMUNITY): Payer: Medicare HMO | Attending: Cardiovascular Disease

## 2017-08-23 ENCOUNTER — Ambulatory Visit (HOSPITAL_BASED_OUTPATIENT_CLINIC_OR_DEPARTMENT_OTHER): Payer: Medicare HMO

## 2017-08-23 DIAGNOSIS — R0602 Shortness of breath: Secondary | ICD-10-CM | POA: Diagnosis not present

## 2017-08-23 DIAGNOSIS — R0600 Dyspnea, unspecified: Secondary | ICD-10-CM | POA: Diagnosis not present

## 2017-08-23 DIAGNOSIS — I447 Left bundle-branch block, unspecified: Secondary | ICD-10-CM | POA: Diagnosis not present

## 2017-08-23 DIAGNOSIS — I083 Combined rheumatic disorders of mitral, aortic and tricuspid valves: Secondary | ICD-10-CM | POA: Diagnosis not present

## 2017-08-23 DIAGNOSIS — I509 Heart failure, unspecified: Secondary | ICD-10-CM | POA: Insufficient documentation

## 2017-08-23 DIAGNOSIS — I110 Hypertensive heart disease with heart failure: Secondary | ICD-10-CM | POA: Insufficient documentation

## 2017-08-23 DIAGNOSIS — I2510 Atherosclerotic heart disease of native coronary artery without angina pectoris: Secondary | ICD-10-CM | POA: Diagnosis not present

## 2017-08-23 DIAGNOSIS — I4891 Unspecified atrial fibrillation: Secondary | ICD-10-CM | POA: Diagnosis not present

## 2017-08-23 LAB — MYOCARDIAL PERFUSION IMAGING
CHL CUP NUCLEAR SRS: 5
CHL CUP RESTING HR STRESS: 79 {beats}/min
Peak HR: 82 {beats}/min
RATE: 0.34
SDS: 4
SSS: 9
TID: 1.11

## 2017-08-23 LAB — ECHOCARDIOGRAM COMPLETE
HEIGHTINCHES: 67 in
WEIGHTICAEL: 2864 [oz_av]

## 2017-08-23 MED ORDER — REGADENOSON 0.4 MG/5ML IV SOLN
0.4000 mg | Freq: Once | INTRAVENOUS | Status: AC
  Administered 2017-08-23: 0.4 mg via INTRAVENOUS

## 2017-08-23 MED ORDER — TECHNETIUM TC 99M TETROFOSMIN IV KIT
10.9000 | PACK | Freq: Once | INTRAVENOUS | Status: AC | PRN
  Administered 2017-08-23: 10.9 via INTRAVENOUS
  Filled 2017-08-23: qty 11

## 2017-08-23 MED ORDER — TECHNETIUM TC 99M TETROFOSMIN IV KIT
31.0000 | PACK | Freq: Once | INTRAVENOUS | Status: AC | PRN
  Administered 2017-08-23: 31 via INTRAVENOUS
  Filled 2017-08-23: qty 31

## 2017-08-23 NOTE — Progress Notes (Signed)
Patient returned call.  He said he is feeling better since he went to ER and doesn't have any fluid.  Transmission reviewed and no fluid accumulation since being diuresed in the ER on 08/10/2017.  He has follow up appointment with [name] on 08/28/2017.  Next remote transmission 09/30/2017 and encouraged to call for any fluid symptoms.

## 2017-08-23 NOTE — Progress Notes (Signed)
EPIC Encounter for ICM Monitoring  Patient Name: [name] is a 84 y.o. male Date: 08/23/2017 Primary Care Physican: Wenda Low, MD Primary Cardiologist:Varanasi  Electrophysiologist:Taylor  Dry Weight:not weighing       Attempted call to patient and unable to reach.  Left message to return call.  Transmission reviewed.    Thoracic impedance normal since ER visit on 08/10/2017.   Prescribed dosage: Torsemide 20 mg 1 tablet daily. Potassium 10 mEq take 1 tablet every day as needed when taking Furosemide.  Labs: 07/06/2016 Creatinine 1.53, BUN 23, Potassium 3.7, Sodium 137, EGFR 40-47 06/07/2016 Creatinine 1.10, BUN 17, Potassium 3.4, Sodium 135, EGFR 60-69 01/07/2016 Creatinine 1.09, BUN 8, Potassium 3.8, Sodium 135, EGFR 59->60  01/06/2016 Creatinine 1.10, BUN 15, Potassium 4.5, Sodium 130, EGFR 58->60  11/02/2015 Creatinine 1.13, BUN 10, Potassium 4.1, Sodium 124, EGFR 56->60  10/13/2015 Creatinine 1.30, BUN 17, Potassium 4.4, Sodium 137  05/19/2015 Creatinine 1.54, BUN 20, Potassium 4.4, Sodium 137, EGFR 39-45  Recommendations: NONE - Unable to reach.  Follow-up plan: ICM clinic phone appointment on 09/30/2017.  Office appointment scheduled 08/28/2017 with [name].   Copy of ICM check sent to Dr. Lovena Le.   3 month ICM trend: 08/22/2017    1 Year ICM trend:       Rosalene Billings, RN 08/23/2017 10:35 AM

## 2017-08-23 NOTE — Telephone Encounter (Signed)
Remote ICM transmission received.  Attempted call to patient and left message to return call. 

## 2017-08-28 ENCOUNTER — Ambulatory Visit: Payer: Medicare HMO | Admitting: Internal Medicine

## 2017-08-28 ENCOUNTER — Encounter: Payer: Self-pay | Admitting: Internal Medicine

## 2017-08-28 VITALS — BP 92/58 | HR 91 | Ht 67.0 in | Wt 178.0 lb

## 2017-08-28 DIAGNOSIS — Z950 Presence of cardiac pacemaker: Secondary | ICD-10-CM | POA: Diagnosis not present

## 2017-08-28 DIAGNOSIS — I481 Persistent atrial fibrillation: Secondary | ICD-10-CM

## 2017-08-28 DIAGNOSIS — I5022 Chronic systolic (congestive) heart failure: Secondary | ICD-10-CM

## 2017-08-28 DIAGNOSIS — I4819 Other persistent atrial fibrillation: Secondary | ICD-10-CM

## 2017-08-28 MED ORDER — TORSEMIDE 20 MG PO TABS: ORAL_TABLET | ORAL | 3 refills | Status: DC

## 2017-08-28 NOTE — Progress Notes (Signed)
HPI Mr. Cardiff returns today for followup. He is a pleasant elderly man with symptomatic bradycardia, chronic diastolic heart failure, lung disease, HTN, and is s/p PPM insertion. In the interim, he c/o low blood pressure and some propensity to swell. He denies chest pain. His chf symptoms are class 2. No syncope.  Allergies  Allergen Reactions  . Omeprazole Magnesium Other (See Comments)    GI upset, insomnia  . Pantoprazole Sodium Other (See Comments)    Gi upset, insomnia Gi upset, insomnia  . Codeine Anxiety and Other (See Comments)    Insomnia/ hyper Insomnia/ hyper  . Omeprazole Nausea And Vomiting and Other (See Comments)    GI upset, insomnia  GI upset, insomnia GI upset, insomnia  . Warfarin Sodium Anxiety and Other (See Comments)    Hx gi bleed  Hx gi bleed     Current Outpatient Medications  Medication Sig Dispense Refill  . acetaminophen (TYLENOL) 500 MG tablet Take 1,000 mg by mouth every 6 (six) hours as needed for headache (pain).     . brimonidine (ALPHAGAN) 0.15 % ophthalmic solution Place 1 drop into both eyes 2 (two) times daily.    . clopidogrel (PLAVIX) 75 MG tablet TAKE 1 TABLET EVERY DAY 90 tablet 2  . diazepam (VALIUM) 5 MG tablet Take 1 tablet (5 mg total) by mouth every 12 (twelve) hours as needed for anxiety. 5 tablet 0  . dorzolamide-timolol (COSOPT) 22.3-6.8 MG/ML ophthalmic solution Place 1 drop into both eyes 2 (two) times daily.   2  . finasteride (PROSCAR) 5 MG tablet Take 1 tablet (5 mg total) by mouth daily. 30 tablet 6  . Flaxseed, Linseed, (FLAX SEEDS PO) Take 15 mLs by mouth daily. Grind up seeds and take with honey    . fluticasone (FLONASE) 50 MCG/ACT nasal spray Place 2 sprays into both nostrils daily as needed for allergies.   1  . Multiple Vitamins-Minerals (EMERGEN-C VITAMIN C) PACK Take 1,000 mg by mouth daily after lunch.    . Multiple Vitamins-Minerals (PRESERVISION AREDS 2) CAPS Take 1 capsule by mouth daily after lunch.      . nitroGLYCERIN (NITROSTAT) 0.4 MG SL tablet Place 1 tablet (0.4 mg total) under the tongue every 5 (five) minutes as needed for chest pain. 20 tablet 0  . Omega-3 Fatty Acids (FISH OIL PO) Take 1 capsule by mouth daily after lunch.    . potassium chloride (K-DUR,KLOR-CON) 10 MEQ tablet TAKE 1 TABLET EVERY DAY AS NEEDED. WHEN YOU TAKE YOUR FUROSEMIDE. 90 tablet 2  . Probiotic Product (PROBIOTIC PO) Take 1 tablet by mouth 2 (two) times daily.     . psyllium (METAMUCIL) 58.6 % packet Take 1 packet by mouth daily with supper. Mix in 8 oz liquid and drink    . tamsulosin (FLOMAX) 0.4 MG CAPS capsule Take 1 capsule (0.4 mg total) by mouth daily after supper. 30 capsule 6  . torsemide (DEMADEX) 20 MG tablet Take 1 tablet by mouth daily.    . traMADol (ULTRAM) 50 MG tablet Take 50 mg as needed by mouth.    . carvedilol (COREG) 3.125 MG tablet Take 1 tablet (3.125 mg total) by mouth 2 (two) times daily. 180 tablet 1   No current facility-administered medications for this visit.      Past Medical History:  Diagnosis Date  . Anxiety   . Arthritis   . Atrial fibrillation, permanent ([place]) 01/07/2016  . Blood transfusion    "related to a surgery"  .  BPH (benign prostatic hypertrophy) with urinary obstruction    s/p turp yrs ago  . Bradycardia    a. Amio d/c'd 08/2013; brady arrest 08/2013 after PCI >>> recurrent AF >>> Amiodarone restarted. b. Pacemaker being considered in 11/2014.  [name] CAD (coronary artery disease)    a. s/p MI and prior PCI of LAD;  b. LHC (08/2013):  prox LAD 60-70%, mid LAD stents ok, ostial lesion at Dx jailed by stent, mild CFX and RCA disesase >>>  PCI (09/08/13):  rotational atherectomy + Promus DES to prox LAD  . Cardiomyopathy ([place])    a. Echo (08/2013):  EF 30-35%, AS hypokinesis, Gr 1 diast dysfn, mild MR, mild LAE >>> b. improved EF 50-55% by echo 8/15. c. EF down again by echo 12/2014 to 30-35% but 51% by nuc.  [name] Chronic lower back pain   . Chronic systolic CHF (congestive  heart failure) ([place])   . CKD (chronic kidney disease), stage III ([place])    a. Per review of labs baseline Cr 1.1-1.3.  [name] DDD (degenerative disc disease)    chronic back pain  . Depression   . Diverticulitis of colon with bleeding    s/p sigmoid resection '88  . DJD (degenerative joint disease) hips and knees   s/p bilateral total replacements  . GERD (gastroesophageal reflux disease)    occ. take prevacid  . History of GI diverticular bleed april 2012   transfused blood and resolved without surgical intervention  . Hypertension   . Impaired hearing bilateral    only left hearing aid  . Impotence, organic    s/p penile prosthesis 1990's  . Incomplete bladder emptying   . LBBB (left bundle branch block)   . Meningioma ([place]) right -sided w/ right VI palsy   followed by dr Gaynell Face  . Mitral regurgitation    a. Mild-mod by echo 12/2014.  [name] Myocardial infarction [facility]) 1980's- medical intervention   "so mild I didn't know I'd had it"  . PAF (paroxysmal atrial fibrillation) ([facility])    not on coumadin due to hx of GI bleed.  . Prostate cancer ([place]) 11/30/13   Gleason 8, volume 22.14 cc  . Sleep apnea    non-compliant cpap    ROS:   All systems reviewed and negative except as noted in the HPI.   Past Surgical History:  Procedure Laterality Date  . APPENDECTOMY    . CARDIAC CATHETERIZATION  2007   noncritical cad (results w/ chart)  . CARDIOVERSION N/A 10/13/2015   Procedure: CARDIOVERSION;  Surgeon: Josue Hector, MD;  Location: [place];  Service: Cardiovascular;  Laterality: N/A;  . CATARACT EXTRACTION W/ INTRAOCULAR LENS  IMPLANT, BILATERAL Bilateral ~ 2000  . CHOLECYSTECTOMY    . CLOSED REDUCTION HIP DISLOCATION Right "several"  . CORONARY ANGIOPLASTY WITH STENT PLACEMENT  08-03-08   drug-eluting stent x2 distal and mid lad  . EP IMPLANTABLE DEVICE N/A 01/31/2015   Procedure: BiV Pacemaker Insertion CRT-P;  Surgeon: Evans Lance, MD;  Location: [place] CV LAB;   Service: Cardiovascular;  Laterality: N/A;  . EP IMPLANTABLE DEVICE N/A 02/07/2015   Procedure: Lead Revision/Repair;  Surgeon: Deboraha Sprang, MD;  Location: [place] CV LAB;  Service: Cardiovascular;  Laterality: N/A;  . ESOPHAGOGASTRODUODENOSCOPY N/A 02/11/2014   Procedure: ESOPHAGOGASTRODUODENOSCOPY (EGD);  Surgeon: Cleotis Nipper, MD;  Location[facility] ENDOSCOPY;  Service: Endoscopy;  Laterality: N/A;  . gamma knife radiation  2000   [facility] for meningioma, last eval 2013- no change  .  INGUINAL HERNIA REPAIR Bilateral   . INNER EAR SURGERY Right yrs ago   "trying to get my hearing back  . LEFT HEART CATHETERIZATION WITH CORONARY ANGIOGRAM N/A 09/04/2013   Procedure: LEFT HEART CATHETERIZATION WITH CORONARY ANGIOGRAM;  Surgeon: Jettie Booze, MD;  Location: [facility] CATH LAB;  Service: Cardiovascular;  Laterality: N/A;  . PENILE PROSTHESIS IMPLANT  1990's  . PERCUTANEOUS CORONARY ROTOBLATOR INTERVENTION (PCI-R) N/A 09/08/2013   Procedure: PERCUTANEOUS CORONARY ROTOBLATOR INTERVENTION (PCI-R);  Surgeon: Jettie Booze, MD;  Location: [facility] CATH LAB;  Service: Cardiovascular;  Laterality: N/A;  . PROSTATE BIOPSY  11/30/13   Gleason 8, vol 22.14 cc  . REVISION TOTAL KNEE ARTHROPLASTY Right   . SHOULDER OPEN ROTATOR CUFF REPAIR Left   . TOTAL HIP ARTHROPLASTY Right 03-25-08--  . TOTAL HIP ARTHROPLASTY Left 2005  . TOTAL HIP REVISION Right 3-4 times  . TOTAL KNEE ARTHROPLASTY Right 2004  . TOTAL KNEE ARTHROPLASTY Left 1997  . TRANSURETHRAL RESECTION OF PROSTATE  "years ago"  . TRANSURETHRAL RESECTION OF PROSTATE  01/08/2011   Procedure: TRANSURETHRAL RESECTION OF THE PROSTATE (TURP);  Surgeon: Franchot Gallo;  Location: [place];  Service: Urology;  Laterality: N/A;  GYRUS      Family History  Problem Relation Age of Onset  . Heart disease Mother   . Heart attack Mother   . Emphysema Father   . Cancer Brother        liver cancer  . Cancer Unknown       Social History   Socioeconomic History  . Marital status: Widowed    Spouse name: Not on file  . Number of children: Not on file  . Years of education: Not on file  . Highest education level: Not on file  Occupational History  . Not on file  Social Needs  . Financial resource strain: Not on file  . Food insecurity:    Worry: Not on file    Inability: Not on file  . Transportation needs:    Medical: Not on file    Non-medical: Not on file  Tobacco Use  . Smoking status: Former Smoker    Packs/day: 1.00    Years: 14.00    Pack years: 14.00    Types: Cigarettes    Last attempt to quit: 01/05/1959    Years since quitting: 58.6  . Smokeless tobacco: Never Used  Substance and Sexual Activity  . Alcohol use: Yes    Comment: 02/09/2014 "I'm not drinking anymore"  . Drug use: No  . Sexual activity: Never  Lifestyle  . Physical activity:    Days per week: Not on file    Minutes per session: Not on file  . Stress: Not on file  Relationships  . Social connections:    Talks on phone: Not on file    Gets together: Not on file    Attends religious service: Not on file    Active member of club or organization: Not on file    Attends meetings of clubs or organizations: Not on file    Relationship status: Not on file  . Intimate partner violence:    Fear of current or ex partner: Not on file    Emotionally abused: Not on file    Physically abused: Not on file    Forced sexual activity: Not on file  Other Topics Concern  . Not on file  Social History Narrative  . Not on file     BP (!) 92/58  Pulse 91   Ht 5\' 7"  (1.702 m)   Wt 178 lb (80.7 kg)   SpO2 93%   BMI 27.88 kg/m   Physical Exam:  Well appearing NAD HEENT: Unremarkable Neck:  No JVD, no thyromegally Lymphatics:  No adenopathy Back:  No CVA tenderness Lungs:  Clear HEART:  Regular rate rhythm, no murmurs, no rubs, no clicks Abd:  soft, positive bowel sounds, no organomegally, no rebound, no  guarding Ext:  2 plus pulses, 1+right leg edema, no cyanosis, no clubbing Skin:  No rashes no nodules Neuro:  CN II through XII intact, motor grossly intact  EKG Atrial flutter with a controlled VR DEVICE  Normal device function.  See PaceArt for details.   Assess/Plan: 1. Chronic diastolic heart failure - he is mostly euvolemic today. He is encouraged to weigh himself and if his weight goes up, then he is encouraged to take an extra torsemide.  2. Atrial fib/flutter - he is in typical flutter today. I have recommended he hold his coreg because his bp is low. 3. PPM - his [employer] biv PPM is working normally. Will recheck in several months.  [name].D.

## 2017-08-28 NOTE — Patient Instructions (Addendum)
Medication Instructions:  Your physician has recommended you make the following change in your medication:  1.  Stop taking carvedilol (Coreg)  2.  Weigh yourself DAILY.  If your weight goes up by 3 pounds take an one extra torsemide by mouth.  Labwork: None ordered.  Testing/Procedures: None ordered.  Follow-Up: Your physician wants you to follow-up in: one year with Dr. Lovena Le.   You will receive a reminder letter in the mail two months in advance. If you don't receive a letter, please call our office to schedule the follow-up appointment.  Remote monitoring is used to monitor your Pacemaker from home. This monitoring reduces the number of office visits required to check your device to one time per year. It allows [place] to keep an eye on the functioning of your device to ensure it is working properly. You are scheduled for a device check from home on 09/30/2017. You may send your transmission at any time that day. If you have a wireless device, the transmission will be sent automatically. After your physician reviews your transmission, you will receive a postcard with your next transmission date.  Any Other Special Instructions Will Be Listed Below (If Applicable).  If you need a refill on your cardiac medications before your next appointment, please call your pharmacy.

## 2017-09-03 LAB — CUP PACEART INCLINIC DEVICE CHECK
Battery Remaining Longevity: 100 mo
Brady Statistic RA Percent Paced: 0 %
Brady Statistic RV Percent Paced: 70 %
Implantable Lead Implant Date: 20161205
Implantable Lead Location: 753858
Implantable Pulse Generator Implant Date: 20161205
Lead Channel Impedance Value: 1050 Ohm
Lead Channel Impedance Value: 462.5 Ohm
Lead Channel Pacing Threshold Amplitude: 0.75 V
Lead Channel Pacing Threshold Amplitude: 0.75 V
Lead Channel Pacing Threshold Pulse Width: 0.5 ms
Lead Channel Pacing Threshold Pulse Width: 0.6 ms
Lead Channel Pacing Threshold Pulse Width: 0.6 ms
Lead Channel Sensing Intrinsic Amplitude: 12 mV
Lead Channel Setting Pacing Pulse Width: 0.5 ms
Lead Channel Setting Pacing Pulse Width: 0.6 ms
Lead Channel Setting Sensing Sensitivity: 2 mV
MDC IDC LEAD IMPLANT DT: 20161205
MDC IDC LEAD IMPLANT DT: 20161205
MDC IDC LEAD LOCATION: 753859
MDC IDC LEAD LOCATION: 753860
MDC IDC MSMT BATTERY VOLTAGE: 2.98 V
MDC IDC MSMT LEADCHNL LV PACING THRESHOLD AMPLITUDE: 1 V
MDC IDC MSMT LEADCHNL LV PACING THRESHOLD AMPLITUDE: 1 V
MDC IDC MSMT LEADCHNL RV PACING THRESHOLD PULSEWIDTH: 0.5 ms
MDC IDC SESS DTM: 20190703153627
MDC IDC SET LEADCHNL LV PACING AMPLITUDE: 2 V
MDC IDC SET LEADCHNL RV PACING AMPLITUDE: 2.5 V
Pulse Gen Serial Number: 7802901

## 2017-09-13 DIAGNOSIS — F419 Anxiety disorder, unspecified: Secondary | ICD-10-CM | POA: Diagnosis not present

## 2017-09-13 DIAGNOSIS — I4891 Unspecified atrial fibrillation: Secondary | ICD-10-CM | POA: Diagnosis not present

## 2017-09-13 DIAGNOSIS — I5020 Unspecified systolic (congestive) heart failure: Secondary | ICD-10-CM | POA: Diagnosis not present

## 2017-09-13 DIAGNOSIS — D329 Benign neoplasm of meninges, unspecified: Secondary | ICD-10-CM | POA: Diagnosis not present

## 2017-09-13 DIAGNOSIS — M961 Postlaminectomy syndrome, not elsewhere classified: Secondary | ICD-10-CM | POA: Diagnosis not present

## 2017-09-13 DIAGNOSIS — N183 Chronic kidney disease, stage 3 (moderate): Secondary | ICD-10-CM | POA: Diagnosis not present

## 2017-09-13 DIAGNOSIS — M519 Unspecified thoracic, thoracolumbar and lumbosacral intervertebral disc disorder: Secondary | ICD-10-CM | POA: Diagnosis not present

## 2017-09-13 DIAGNOSIS — R269 Unspecified abnormalities of gait and mobility: Secondary | ICD-10-CM | POA: Diagnosis not present

## 2017-09-13 DIAGNOSIS — N400 Enlarged prostate without lower urinary tract symptoms: Secondary | ICD-10-CM | POA: Diagnosis not present

## 2017-09-27 DIAGNOSIS — R143 Flatulence: Secondary | ICD-10-CM | POA: Diagnosis not present

## 2017-09-27 DIAGNOSIS — R198 Other specified symptoms and signs involving the digestive system and abdomen: Secondary | ICD-10-CM | POA: Diagnosis not present

## 2017-09-30 ENCOUNTER — Ambulatory Visit (INDEPENDENT_AMBULATORY_CARE_PROVIDER_SITE_OTHER): Payer: Medicare HMO

## 2017-09-30 ENCOUNTER — Ambulatory Visit (INDEPENDENT_AMBULATORY_CARE_PROVIDER_SITE_OTHER): Payer: Medicare HMO | Admitting: *Deleted

## 2017-09-30 DIAGNOSIS — Z950 Presence of cardiac pacemaker: Secondary | ICD-10-CM | POA: Diagnosis not present

## 2017-09-30 DIAGNOSIS — I5022 Chronic systolic (congestive) heart failure: Secondary | ICD-10-CM

## 2017-09-30 DIAGNOSIS — I4821 Permanent atrial fibrillation: Secondary | ICD-10-CM

## 2017-09-30 NOTE — Progress Notes (Signed)
EPIC Encounter for ICM Monitoring  Patient Name: [name] is a 84 y.o. male Date: 09/30/2017 Primary Care Physican: Lawerance Cruel, MD Primary Cardiologist:Varanasi  Electrophysiologist:Taylor  Dry Weight:not weighing       Heart Failure questions reviewed, pt has no energy and headache.  He reports following a low salt diet and makes his own soups.    Thoracic impedance abnormal suggesting fluid accumulation starting 09/22/2017.  Prescribed dosage: Torsemide 20 mg 1 tablet daily. Take 1 extra Torsemide tablet for weight gain of 3 lbs.    Labs: 08/14/2017 Creatinine 1.24, BUN 21, Potassium 4.0, Sodium 139, EGFR 50-58      08/06/2017 Creatinine 1.21, BUN 17, Potassium 4.4, Sodium 141, EGFR 53-61      07/06/2016 Creatinine 1.53, BUN 23, Potassium 3.7, Sodium 137, EGFR 40-47 06/07/2016 Creatinine 1.10, BUN 17, Potassium 3.4, Sodium 135, EGFR 60-69  Recommendations: Advised to take 1 tablet extra Torsemide today and tomorrow as prescribed.  Reinforced fluid restriction to < 2 L daily and sodium restriction to less than 2000 mg daily.  Encouraged to call for fluid symptoms.  Follow-up plan: ICM clinic phone appointment on 10/04/2017 (manual send) to recheck fluid levels.     Copy of ICM check sent to Dr. Lovena Le and Dr Irish Lack.   3 month ICM trend: 09/30/2017    1 Year ICM trend:       Rosalene Billings, RN 09/30/2017 3:47 PM

## 2017-10-01 NOTE — Progress Notes (Signed)
Remote pacemaker transmission.   

## 2017-10-04 ENCOUNTER — Ambulatory Visit (INDEPENDENT_AMBULATORY_CARE_PROVIDER_SITE_OTHER): Payer: Medicare HMO

## 2017-10-04 DIAGNOSIS — Z950 Presence of cardiac pacemaker: Secondary | ICD-10-CM

## 2017-10-04 DIAGNOSIS — I5022 Chronic systolic (congestive) heart failure: Secondary | ICD-10-CM

## 2017-10-11 ENCOUNTER — Telehealth: Payer: Self-pay | Admitting: Interventional Cardiology

## 2017-10-11 ENCOUNTER — Telehealth: Payer: Self-pay

## 2017-10-11 NOTE — Progress Notes (Signed)
Patient returned call. He stated he is doing fine.  Transmission reviewed and advised impedance returned to normal after taking extra Torsemide.  Encouraged to call for any fluid symptoms.  Transmission reviewed.  Next ICM transmission 11/07/2017

## 2017-10-11 NOTE — Telephone Encounter (Signed)
Remote ICM transmission received.  Attempted call to patient and left message to return call. 

## 2017-10-11 NOTE — Progress Notes (Signed)
EPIC Encounter for ICM Monitoring  Patient Name: OSCAR HANK is a 84 y.o. male Date: 10/11/2017 Primary Care Physican: Lawerance Cruel, MD Primary Cardiologist:Varanasi  Electrophysiologist:Taylor  Dry Weight:not weighing      Attempted call to patient and unable to reach.  Left message to return call.  Transmission reviewed.    Thoracic impedance returned to normal since last remote transmission on 09/30/2017 and after taking extra Torsemide.  Prescribed dosage: Torsemide 20 mg 1 tablet daily. Take 1 extra Torsemide tablet for weight gain of 3 lbs.    Labs: 08/14/2017 Creatinine 1.24, BUN 21, Potassium 4.0, Sodium 139, EGFR 50-58      08/06/2017 Creatinine 1.21, BUN 17, Potassium 4.4, Sodium 141, EGFR 53-61      07/06/2016 Creatinine 1.53, BUN 23, Potassium 3.7, Sodium 137, EGFR 40-47  Recommendations: NONE - Unable to reach.  Follow-up plan: ICM clinic phone appointment on 11/07/2017.      Copy of ICM check sent to Dr. Lovena Le.   3 month ICM trend: 10/11/2017    1 Year ICM trend:       Rosalene Billings, RN 10/11/2017 1:49 PM

## 2017-10-11 NOTE — Telephone Encounter (Signed)
See ICM note 10/04/2017.

## 2017-10-11 NOTE — Telephone Encounter (Signed)
Routed to Sharman Cheek, Therapist, sports.

## 2017-10-11 NOTE — Telephone Encounter (Signed)
New Message  Pt called back stating returning nurses call in regards to his pacemaker.  Please f/u with pt

## 2017-10-15 DIAGNOSIS — M545 Low back pain: Secondary | ICD-10-CM | POA: Diagnosis not present

## 2017-10-23 DIAGNOSIS — H348322 Tributary (branch) retinal vein occlusion, left eye, stable: Secondary | ICD-10-CM | POA: Diagnosis not present

## 2017-10-23 DIAGNOSIS — H01021 Squamous blepharitis right upper eyelid: Secondary | ICD-10-CM | POA: Diagnosis not present

## 2017-10-23 DIAGNOSIS — H01022 Squamous blepharitis right lower eyelid: Secondary | ICD-10-CM | POA: Diagnosis not present

## 2017-10-23 DIAGNOSIS — Z961 Presence of intraocular lens: Secondary | ICD-10-CM | POA: Diagnosis not present

## 2017-10-23 DIAGNOSIS — H532 Diplopia: Secondary | ICD-10-CM | POA: Diagnosis not present

## 2017-10-23 DIAGNOSIS — H01024 Squamous blepharitis left upper eyelid: Secondary | ICD-10-CM | POA: Diagnosis not present

## 2017-10-23 DIAGNOSIS — H02831 Dermatochalasis of right upper eyelid: Secondary | ICD-10-CM | POA: Diagnosis not present

## 2017-10-23 DIAGNOSIS — H02834 Dermatochalasis of left upper eyelid: Secondary | ICD-10-CM | POA: Diagnosis not present

## 2017-10-23 DIAGNOSIS — H01025 Squamous blepharitis left lower eyelid: Secondary | ICD-10-CM | POA: Diagnosis not present

## 2017-10-29 LAB — CUP PACEART REMOTE DEVICE CHECK
Battery Remaining Longevity: 97 mo
Battery Remaining Percentage: 95.5 %
Battery Voltage: 2.98 V
Date Time Interrogation Session: 20190805060031
Implantable Lead Implant Date: 20161205
Implantable Lead Implant Date: 20161205
Implantable Lead Implant Date: 20161205
Implantable Lead Location: 753858
Implantable Lead Location: 753859
Implantable Lead Location: 753860
Implantable Pulse Generator Implant Date: 20161205
Lead Channel Impedance Value: 1025 Ohm
Lead Channel Impedance Value: 430 Ohm
Lead Channel Pacing Threshold Amplitude: 0.75 V
Lead Channel Pacing Threshold Amplitude: 1 V
Lead Channel Pacing Threshold Pulse Width: 0.5 ms
Lead Channel Pacing Threshold Pulse Width: 0.6 ms
Lead Channel Sensing Intrinsic Amplitude: 9.4 mV
Lead Channel Setting Pacing Amplitude: 2 V
Lead Channel Setting Pacing Amplitude: 2.5 V
Lead Channel Setting Pacing Pulse Width: 0.5 ms
Lead Channel Setting Pacing Pulse Width: 0.6 ms
Lead Channel Setting Sensing Sensitivity: 2 mV
Pulse Gen Model: 3262
Pulse Gen Serial Number: 7802901

## 2017-11-05 DIAGNOSIS — Z6828 Body mass index (BMI) 28.0-28.9, adult: Secondary | ICD-10-CM | POA: Diagnosis not present

## 2017-11-05 DIAGNOSIS — M545 Low back pain: Secondary | ICD-10-CM | POA: Diagnosis not present

## 2017-11-07 ENCOUNTER — Telehealth: Payer: Self-pay

## 2017-11-07 ENCOUNTER — Ambulatory Visit (INDEPENDENT_AMBULATORY_CARE_PROVIDER_SITE_OTHER): Payer: Medicare HMO

## 2017-11-07 DIAGNOSIS — Z950 Presence of cardiac pacemaker: Secondary | ICD-10-CM

## 2017-11-07 DIAGNOSIS — I5022 Chronic systolic (congestive) heart failure: Secondary | ICD-10-CM

## 2017-11-07 NOTE — Progress Notes (Signed)
EPIC Encounter for ICM Monitoring  Patient Name: [name] is a 84 y.o. male Date: 11/07/2017 Primary Care Physican: Lawerance Cruel, MD Primary Cardiologist:Varanasi  Electrophysiologist:Taylor  Dry Weight:not weighing          Spoke with patient and he gave permission to speak with caregiver Tonya.  Heart Failure questions reviewed, pt asymptomatic.  Patient does use sea salt and explained that is the same as using regular salt and to avoid if possible.   Thoracic impedance normal.  Prescribed dosage: Torsemide 20 mg 1 tablet daily. Take 1 extra Torsemide tablet for weight gain of 3 lbs.  Labs: 08/14/2017 Creatinine 1.24, BUN 21, Potassium 4.0, Sodium 139, EGFR 50-58 08/06/2017 Creatinine 1.21, BUN 17, Potassium 4.4, Sodium 141, EGFR 53-61      07/06/2016 Creatinine 1.53, BUN 23, Potassium 3.7, Sodium 137, EGFR 40-47  Recommendations: No changes.  Reinforced fluid restriction to < 2 L daily and sodium restriction to less than 2000 mg daily. Encouraged to call for fluid symptoms.  Follow-up plan: ICM clinic phone appointment on 12/09/2017.    Copy of ICM check sent to Dr. Lovena Le.   3 month ICM trend: 11/07/2017    1 Year ICM trend:       Rosalene Billings, RN 11/07/2017 3:50 PM

## 2017-11-07 NOTE — Telephone Encounter (Signed)
Spoke with pt and reminded pt of remote transmission that is due today. Pt verbalized understanding.  Monthly Check with Margarita Grizzle.

## 2017-11-13 DIAGNOSIS — K219 Gastro-esophageal reflux disease without esophagitis: Secondary | ICD-10-CM | POA: Diagnosis not present

## 2017-11-13 DIAGNOSIS — K5900 Constipation, unspecified: Secondary | ICD-10-CM | POA: Diagnosis not present

## 2017-11-20 DIAGNOSIS — N99111 Postprocedural bulbous urethral stricture: Secondary | ICD-10-CM | POA: Diagnosis not present

## 2017-11-20 DIAGNOSIS — C61 Malignant neoplasm of prostate: Secondary | ICD-10-CM | POA: Diagnosis not present

## 2017-12-04 NOTE — Progress Notes (Signed)
Cardiology Office Note   Date:  12/05/2017   ID:  [name], DOB 1926/06/09, MRN [id number]  PCP:  Lawerance Cruel, MD    No chief complaint on file.  CAD  Wt Readings from Last 3 Encounters:  12/05/17 186 lb 3.2 oz (84.5 kg)  08/28/17 178 lb (80.7 kg)  08/23/17 179 lb (81.2 kg)       History of Present Illness: [name] is a 84 y.o. male  with prior AFib, CAD and mitral regurgitation. He most recently had a complex PCI of his LAD in July 2015 involving rotational atherectomy. He had a hematoma in his right wrist at the time of the diagnostic cath. He had a right groin hematoma after the intervention. Prior to the intervention, his ejection fraction was in the 30-35% range. Echocardiogram about a month after the intervention showed an improvement in his EF from 50-55%. He continued to have some fatigue post procedure. He has had issues with his prostate and difficulty sleeping.  He then developed tachybrady syndrome and had a BiV - pacer placed. It took He was found to have AFib. He had GI bleeding with Warfarin. He did not tolerate Eliquis. Amio was started to maintain NSR, but he reverted to AFib even after DCCV. Most recently, in 9/17, Amio and Warfarin were stopped and Plavix was restarted since he had taken this without bleeding issues in the past.  In June 2019, he had volume overload.  He received IV Lasix and felt better.  He has been stable on torsemide.  Denies : Chest pain. Dizziness. Leg edema. Nitroglycerin use. Orthopnea. Palpitations. Paroxysmal nocturnal dyspnea. Shortness of breath. Syncope.   He has some acid reflux.  He takes Protonix.   He is limited by back pain.      Past Medical History:  Diagnosis Date  . Anxiety   . Arthritis   . Atrial fibrillation, permanent 01/07/2016  . Blood transfusion    "related to a surgery"  . BPH (benign prostatic hypertrophy) with urinary obstruction    s/p turp yrs ago  . Bradycardia    a.  Amio d/c'd 08/2013; brady arrest 08/2013 after PCI >>> recurrent AF >>> Amiodarone restarted. b. Pacemaker being considered in 11/2014.  [name] CAD (coronary artery disease)    a. s/p MI and prior PCI of LAD;  b. LHC (08/2013):  prox LAD 60-70%, mid LAD stents ok, ostial lesion at Dx jailed by stent, mild CFX and RCA disesase >>>  PCI (09/08/13):  rotational atherectomy + Promus DES to prox LAD  . Cardiomyopathy ([place])    a. Echo (08/2013):  EF 30-35%, AS hypokinesis, Gr 1 diast dysfn, mild MR, mild LAE >>> b. improved EF 50-55% by echo 8/15. c. EF down again by echo 12/2014 to 30-35% but 51% by nuc.  [name] Chronic lower back pain   . Chronic systolic CHF (congestive heart failure) ([place])   . CKD (chronic kidney disease), stage III ([place])    a. Per review of labs baseline Cr 1.1-1.3.  [name] DDD (degenerative disc disease)    chronic back pain  . Depression   . Diverticulitis of colon with bleeding    s/p sigmoid resection '88  . DJD (degenerative joint disease) hips and knees   s/p bilateral total replacements  . GERD (gastroesophageal reflux disease)    occ. take prevacid  . History of GI diverticular bleed april 2012   transfused blood and resolved without surgical intervention  . Hypertension   .  Impaired hearing bilateral    only left hearing aid  . Impotence, organic    s/p penile prosthesis 1990's  . Incomplete bladder emptying   . LBBB (left bundle branch block)   . Meningioma ([place]) right -sided w/ right VI palsy   followed by dr Gaynell Face  . Mitral regurgitation    a. Mild-mod by echo 12/2014.  [name] Myocardial infarction [facility]) 1980's- medical intervention   "so mild I didn't know I'd had it"  . PAF (paroxysmal atrial fibrillation) (H[facility])    not on coumadin due to hx of GI bleed.  . Prostate cancer ([place]) 11/30/13   Gleason 8, volume 22.14 cc  . Sleep apnea    non-compliant cpap    Past Surgical History:  Procedure Laterality Date  . APPENDECTOMY    . CARDIAC CATHETERIZATION  2007    noncritical cad (results w/ chart)  . CARDIOVERSION N/A 10/13/2015   Procedure: CARDIOVERSION;  Surgeon: Josue Hector, MD;  Location: [place];  Service: Cardiovascular;  Laterality: N/A;  . CATARACT EXTRACTION W/ INTRAOCULAR LENS  IMPLANT, BILATERAL Bilateral ~ 2000  . CHOLECYSTECTOMY    . CLOSED REDUCTION HIP DISLOCATION Right "several"  . CORONARY ANGIOPLASTY WITH STENT PLACEMENT  08-03-08   drug-eluting stent x2 distal and mid lad  . EP IMPLANTABLE DEVICE N/A 01/31/2015   Procedure: BiV Pacemaker Insertion CRT-P;  Surgeon: Evans Lance, MD;  Location: [place] [facility];  Service: Cardiovascular;  Laterality: N/A;  . EP IMPLANTABLE DEVICE N/A 02/07/2015   Procedure: Lead Revision/Repair;  Surgeon: Deboraha Sprang, MD;  Location: [place] [facility];  Service: Cardiovascular;  Laterality: N/A;  . ESOPHAGOGASTRODUODENOSCOPY N/A 02/11/2014   Procedure: ESOPHAGOGASTRODUODENOSCOPY (EGD);  Surgeon: Cleotis Nipper, MD;  [facility] ENDOSCOPY;  Service: Endoscopy;  Laterality: N/A;  . gamma knife radiation  2000   [facility] for meningioma, last eval 2013- no change  . INGUINAL HERNIA REPAIR Bilateral   . INNER EAR SURGERY Right yrs ago   "trying to get my hearing back  . LEFT HEART CATHETERIZATION WITH CORONARY ANGIOGRAM N/A 09/04/2013   Procedure: LEFT HEART CATHETERIZATION WITH CORONARY ANGIOGRAM;  Surgeon: Jettie Booze, MD;  Location: [facility] CATH LAB;  Service: Cardiovascular;  Laterality: N/A;  . PENILE PROSTHESIS IMPLANT  1990's  . PERCUTANEOUS CORONARY ROTOBLATOR INTERVENTION (PCI-R) N/A 09/08/2013   Procedure: PERCUTANEOUS CORONARY ROTOBLATOR INTERVENTION (PCI-R);  Surgeon: Jettie Booze, MD;  Location: [facility] CATH LAB;  Service: Cardiovascular;  Laterality: N/A;  . PROSTATE BIOPSY  11/30/13   Gleason 8, vol 22.14 cc  . REVISION TOTAL KNEE ARTHROPLASTY Right   . SHOULDER OPEN ROTATOR CUFF REPAIR Left   . TOTAL HIP ARTHROPLASTY Right 03-25-08--  . TOTAL HIP ARTHROPLASTY Left 2005    . TOTAL HIP REVISION Right 3-4 times  . TOTAL KNEE ARTHROPLASTY Right 2004  . TOTAL KNEE ARTHROPLASTY Left 1997  . TRANSURETHRAL RESECTION OF PROSTATE  "years ago"  . TRANSURETHRAL RESECTION OF PROSTATE  01/08/2011   Procedure: TRANSURETHRAL RESECTION OF THE PROSTATE (TURP);  Surgeon: Franchot Gallo;  Location: [place];  Service: Urology;  Laterality: N/A;  GYRUS      Current Outpatient Medications  Medication Sig Dispense Refill  . acetaminophen (TYLENOL) 500 MG tablet Take 1,000 mg by mouth every 6 (six) hours as needed for headache (pain).     . brimonidine (ALPHAGAN) 0.15 % ophthalmic solution Place 1 drop into both eyes 2 (two) times daily.    . clopidogrel (PLAVIX) 75 MG  tablet TAKE 1 TABLET EVERY DAY 90 tablet 2  . diazepam (VALIUM) 5 MG tablet Take 1 tablet (5 mg total) by mouth every 12 (twelve) hours as needed for anxiety. 5 tablet 0  . dorzolamide-timolol (COSOPT) 22.3-6.8 MG/ML ophthalmic solution Place 1 drop into both eyes 2 (two) times daily.   2  . finasteride (PROSCAR) 5 MG tablet Take 1 tablet (5 mg total) by mouth daily. 30 tablet 6  . Flaxseed, Linseed, (FLAX SEEDS PO) Take 15 mLs by mouth daily. Grind up seeds and take with honey    . fluticasone (FLONASE) 50 MCG/ACT nasal spray Place 2 sprays into both nostrils daily as needed for allergies.   1  . Multiple Vitamins-Minerals (EMERGEN-C VITAMIN C) PACK Take 1,000 mg by mouth daily after lunch.    . Multiple Vitamins-Minerals (PRESERVISION AREDS 2) CAPS Take 1 capsule by mouth daily after lunch.    . nitroGLYCERIN (NITROSTAT) 0.4 MG SL tablet Place 1 tablet (0.4 mg total) under the tongue every 5 (five) minutes as needed for chest pain. 20 tablet 0  . Omega-3 Fatty Acids (FISH OIL PO) Take 1 capsule by mouth daily after lunch.    . potassium chloride (K-DUR,KLOR-CON) 10 MEQ tablet TAKE 1 TABLET EVERY DAY AS NEEDED. WHEN YOU TAKE YOUR FUROSEMIDE. 90 tablet 2  . Probiotic Product (PROBIOTIC PO) Take  1 tablet by mouth 2 (two) times daily.     . psyllium (METAMUCIL) 58.6 % packet Take 1 packet by mouth daily with supper. Mix in 8 oz liquid and drink    . tamsulosin (FLOMAX) 0.4 MG CAPS capsule Take 1 capsule (0.4 mg total) by mouth daily after supper. 30 capsule 6  . torsemide (DEMADEX) 20 MG tablet Take 1 tablet by mouth daily.    [name] torsemide (DEMADEX) 20 MG tablet Take one extra tablet of torsemide by mouth if weight gain of 3 pounds 30 tablet 3  . traMADol (ULTRAM) 50 MG tablet Take 50 mg as needed by mouth.     No current facility-administered medications for this visit.     Allergies:   Omeprazole magnesium; Pantoprazole; Pantoprazole sodium; Codeine; Omeprazole; and Warfarin sodium    Social History:  The patient  reports that he quit smoking about 58 years ago. His smoking use included cigarettes. He has a 14.00 pack-year smoking history. He has never used smokeless tobacco. He reports that he drinks alcohol. He reports that he does not use drugs.   Family History:  The patient's family history includes Cancer in his brother and unknown relative; Emphysema in his father; Heart attack in his mother; Heart disease in his mother.    ROS:  Please see the history of present illness.   Otherwise, review of systems are positive for back pain.   All other systems are reviewed and negative.    PHYSICAL EXAM: VS:  BP 110/70   Pulse 94   Ht 5\' 7"  (1.702 m)   Wt 186 lb 3.2 oz (84.5 kg)   SpO2 95%   BMI 29.16 kg/m  , BMI Body mass index is 29.16 kg/m. GEN: Well nourished, well developed, in no acute distress  HEENT: normal  Neck: no JVD, carotid bruits, or masses Cardiac: RRR; no murmurs, rubs, or gallops,; tr LE edema  Respiratory:  clear to auscultation bilaterally, normal work of breathing GI: soft, nontender, nondistended, + BS MS: no deformity or atrophy  Skin: warm and dry, no rash Neuro:  Strength and sensation are intact Psych: euthymic mood,  full affect   EKG:   The  ekg ordered in July 2019 demonstrates atrial flutter   Recent Labs: 08/10/2017: B Natriuretic Peptide 514.2; BUN 21; Creatinine, Ser 1.24; Hemoglobin 14.0; Platelets 124; Potassium 4.0; Sodium 139   Lipid Panel    Component Value Date/Time   CHOL 157 12/31/2016 1012   TRIG 145 12/31/2016 1012   HDL 35 (L) 12/31/2016 1012   CHOLHDL 4.5 12/31/2016 1012   LDLCALC 93 12/31/2016 1012     Other studies Reviewed: Additional studies/ records that were reviewed today with results demonstrating: cath results reviewed; hospital records from June 2019.   ASSESSMENT AND PLAN:  1. CAD: No angina.  Continue aggressive secondary prevention.   2. AFib/flutter: GI bleeding has prevented anticoagulaton.  3. Moderate mitral regurgitation: no CHF sx.  Moderate by last echo. Repeat in 07/2018.  Euvolemic on torsemide.  If he has recurrent diastolic heart failure, would consider TEE. 4. HTN: The current medical regimen is effective;  continue present plan and medications. 5. PAcer: Followed by Dr. Lovena Le. 8. Cade for him to go to [place] or the winter.    Current medicines are reviewed at length with the patient today.  The patient concerns regarding his medicines were addressed.  The following changes have been made:  No change  Labs/ tests ordered today include:  No orders of the defined types were placed in this encounter.   Recommend 150 minutes/week of aerobic exercise Low fat, low carb, high fiber diet recommended  Disposition:   FU in 1 year   Signed, Larae Grooms, MD  12/05/2017 10:34 AM    [facility] [address], [place], [place]  [address] Phone: [phone]; Fax: [phone]

## 2017-12-05 ENCOUNTER — Encounter: Payer: Self-pay | Admitting: Interventional Cardiology

## 2017-12-05 ENCOUNTER — Ambulatory Visit: Payer: Medicare HMO | Admitting: Interventional Cardiology

## 2017-12-05 VITALS — BP 110/70 | HR 94 | Ht 67.0 in | Wt 186.2 lb

## 2017-12-05 DIAGNOSIS — I340 Nonrheumatic mitral (valve) insufficiency: Secondary | ICD-10-CM

## 2017-12-05 DIAGNOSIS — I10 Essential (primary) hypertension: Secondary | ICD-10-CM | POA: Diagnosis not present

## 2017-12-05 DIAGNOSIS — Z950 Presence of cardiac pacemaker: Secondary | ICD-10-CM

## 2017-12-05 DIAGNOSIS — I4821 Permanent atrial fibrillation: Secondary | ICD-10-CM

## 2017-12-05 DIAGNOSIS — I25118 Atherosclerotic heart disease of native coronary artery with other forms of angina pectoris: Secondary | ICD-10-CM | POA: Diagnosis not present

## 2017-12-05 LAB — BASIC METABOLIC PANEL
BUN/Creatinine Ratio: 11 (ref 10–24)
BUN: 14 mg/dL (ref 8–27)
CALCIUM: 9.6 mg/dL (ref 8.6–10.2)
CHLORIDE: 98 mmol/L (ref 96–106)
CO2: 22 mmol/L (ref 20–29)
Creatinine, Ser: 1.25 mg/dL (ref 0.76–1.27)
GFR calc non Af Amer: 51 mL/min/{1.73_m2} — ABNORMAL LOW (ref >59)
GFR, EST AFRICAN AMERICAN: 59 mL/min/{1.73_m2} — AB (ref >59)
Glucose: 122 mg/dL — ABNORMAL HIGH (ref 65–99)
POTASSIUM: 4.1 mmol/L (ref 3.5–5.2)
Sodium: 137 mmol/L (ref 134–144)

## 2017-12-05 NOTE — Patient Instructions (Addendum)
Medication Instructions:  Your physician recommends that you continue on your current medications as directed. Please refer to the Current Medication list given to you today.  If you need a refill on your cardiac medications before your next appointment, please call your pharmacy.   Lab work: TODAY: BMET  If you have labs (blood work) drawn today and your tests are completely normal, you will receive your results only by: [name] MyChart Message (if you have MyChart) OR . A paper copy in the mail If you have any lab test that is abnormal or we need to change your treatment, we will call you to review the results.  Testing/Procedures: Your physician has requested that you have an echocardiogram in June 2020. Echocardiography is a painless test that uses sound waves to create images of your heart. It provides your doctor with information about the size and shape of your heart and how well your heart's chambers and valves are working. This procedure takes approximately one hour. There are no restrictions for this procedure.    Follow-Up: At [facility], you and your health needs are our priority.  As part of our continuing mission to provide you with exceptional heart care, we have created designated Provider Care Teams.  These Care Teams include your primary Cardiologist (physician) and Advanced Practice Providers (APPs -  Physician Assistants and Nurse Practitioners) who all work together to provide you with the care you need, when you need it. . You will need a follow up appointment in 1 year.  Please call our office 2 months in advance to schedule this appointment.  You may see Casandra Doffing, MD or one of the following Advanced Practice Providers on your designated Care Team:   . Lyda Jester, PA-C . Dayna Dunn, PA-C . Ermalinda Barrios, PA-C  Any Other Special Instructions Will Be Listed Below (If Applicable).

## 2017-12-09 ENCOUNTER — Ambulatory Visit (INDEPENDENT_AMBULATORY_CARE_PROVIDER_SITE_OTHER): Payer: Medicare HMO

## 2017-12-09 DIAGNOSIS — Z950 Presence of cardiac pacemaker: Secondary | ICD-10-CM | POA: Diagnosis not present

## 2017-12-09 DIAGNOSIS — I5022 Chronic systolic (congestive) heart failure: Secondary | ICD-10-CM | POA: Diagnosis not present

## 2017-12-10 NOTE — Progress Notes (Signed)
EPIC Encounter for ICM Monitoring  Patient Name: [name] is a 84 y.o. male Date: 12/10/2017 Primary Care Physican: Lawerance Cruel, MD Primary Cardiologist:Varanasi  Electrophysiologist:Taylor  Dry Weight:not weighing       Heart Failure questions reviewed, pt asymptomatic.   Thoracic impedance normal.   Prescribed: Torsemide 20 mg 1 tablet daily. Take 1 extra Torsemide tablet for weight gain of 3 lbs.  Labs: 08/14/2017 Creatinine 1.24, BUN 21, Potassium 4.0, Sodium 139, EGFR 50-5806/12/2017 Creatinine 1.21, BUN 17, Potassium 4.4, Sodium 141, EGFR 53-61  Recommendations: No changes.  Encouraged to call for fluid symptoms.  Follow-up plan: ICM clinic phone appointment on 01/09/2018.    Copy of ICM check sent to Dr. Lovena Le.   3 month ICM trend: 12/09/2017    1 Year ICM trend:       Rosalene Billings, RN 12/10/2017 7:56 AM

## 2017-12-25 DIAGNOSIS — M5136 Other intervertebral disc degeneration, lumbar region: Secondary | ICD-10-CM | POA: Diagnosis not present

## 2017-12-25 DIAGNOSIS — M47817 Spondylosis without myelopathy or radiculopathy, lumbosacral region: Secondary | ICD-10-CM | POA: Diagnosis not present

## 2017-12-26 ENCOUNTER — Telehealth: Payer: Self-pay

## 2017-12-26 NOTE — Telephone Encounter (Signed)
   Primary Cardiologist: Larae Grooms, MD  Chart reviewed as part of pre-operative protocol coverage. Dr. Irish Lack, please provide clearance and plavix recommendations as you have seen the patient 3 weeks ago. Please forward your response to P CV DIV PREOP.   Thank you  Leanor Kail, PA 12/26/2017, 12:52 PM

## 2017-12-26 NOTE — Telephone Encounter (Signed)
   [facility] Pre-operative Risk Assessment    Request for surgical clearance:  1. What type of surgery is being performed? Right Medial Branch Block at L4-S1   2. When is this surgery scheduled? 01/02/18   3. What type of clearance is required (medical clearance vs. Pharmacy clearance to hold med vs. Both)? Both  4. Are there any medications that need to be held prior to surgery and how long? Plavix 7 days prior  5. Practice name and name of physician performing surgery? [facility]: Dr. Clydell Hakim   6. What is your office phone number? [phone]    7.   What is your office fax number? [phone]  8.   Anesthesia type (None, local, MAC, general)?

## 2017-12-27 NOTE — Telephone Encounter (Signed)
Called and and made the patient aware that he can hold his plavix for 7 days prior to his surgery. Patient is not taking any aspirin. Patient verbalized understanding and thanked me for the call.   Clearance faxed to Dr. Maryjean Ka office.

## 2017-12-27 NOTE — Telephone Encounter (Signed)
OK to hold Plavix and aspirin 7 days prior to surgery.

## 2017-12-27 NOTE — Telephone Encounter (Signed)
Confirmation received that fax was successful.

## 2018-01-02 DIAGNOSIS — M47817 Spondylosis without myelopathy or radiculopathy, lumbosacral region: Secondary | ICD-10-CM | POA: Diagnosis not present

## 2018-01-09 ENCOUNTER — Ambulatory Visit (INDEPENDENT_AMBULATORY_CARE_PROVIDER_SITE_OTHER): Payer: Medicare HMO

## 2018-01-09 ENCOUNTER — Ambulatory Visit (INDEPENDENT_AMBULATORY_CARE_PROVIDER_SITE_OTHER): Payer: Medicare HMO | Admitting: *Deleted

## 2018-01-09 DIAGNOSIS — Z950 Presence of cardiac pacemaker: Secondary | ICD-10-CM | POA: Diagnosis not present

## 2018-01-09 DIAGNOSIS — I5022 Chronic systolic (congestive) heart failure: Secondary | ICD-10-CM | POA: Diagnosis not present

## 2018-01-09 DIAGNOSIS — I442 Atrioventricular block, complete: Secondary | ICD-10-CM

## 2018-01-09 NOTE — Progress Notes (Signed)
EPIC Encounter for ICM Monitoring  Patient Name: SAMIK BALKCOM is a 84 y.o. male Date: 01/09/2018 Primary Care Physican: Lawerance Cruel, MD Primary Cardiologist:Varanasi  Electrophysiologist:Taylor  Last Weight: unknown       Attempted call to patient and unable to reach.    Transmission reviewed.    Thoracic impedance abnormal suggesting dryness starting 01/08/2018.   Prescribed: Torsemide 20 mg 1 tablet daily. Take 1 extra Torsemide tablet for weight gain of 3 lbs.   Labs: 08/14/2017 Creatinine 1.24, BUN 21, Potassium 4.0, Sodium 139, EGFR 50-58 08/06/2017 Creatinine 1.21, BUN 17, Potassium 4.4, Sodium 141, EGFR 53-61  Recommendations: Unable to reach.  Follow-up plan: ICM clinic phone appointment on 02/10/2018.      Copy of ICM check sent to Dr. Lovena Le.   3 month ICM trend: 01/09/2018    1 Year ICM trend:       Rosalene Billings, RN 01/09/2018 11:17 AM

## 2018-01-09 NOTE — Progress Notes (Signed)
Remote pacemaker transmission.   

## 2018-01-10 ENCOUNTER — Telehealth: Payer: Self-pay

## 2018-01-10 DIAGNOSIS — R0600 Dyspnea, unspecified: Secondary | ICD-10-CM | POA: Diagnosis not present

## 2018-01-10 DIAGNOSIS — R609 Edema, unspecified: Secondary | ICD-10-CM | POA: Diagnosis not present

## 2018-01-10 DIAGNOSIS — F411 Generalized anxiety disorder: Secondary | ICD-10-CM | POA: Diagnosis not present

## 2018-01-10 DIAGNOSIS — Z23 Encounter for immunization: Secondary | ICD-10-CM | POA: Diagnosis not present

## 2018-01-10 DIAGNOSIS — M545 Low back pain: Secondary | ICD-10-CM | POA: Diagnosis not present

## 2018-01-10 NOTE — Telephone Encounter (Signed)
Remote ICM transmission received.  Attempted call to patient regarding ICM remote transmission and person answering stated he was not available.

## 2018-01-27 DIAGNOSIS — M47817 Spondylosis without myelopathy or radiculopathy, lumbosacral region: Secondary | ICD-10-CM | POA: Diagnosis not present

## 2018-02-03 DIAGNOSIS — M47817 Spondylosis without myelopathy or radiculopathy, lumbosacral region: Secondary | ICD-10-CM | POA: Diagnosis not present

## 2018-02-07 ENCOUNTER — Encounter (INDEPENDENT_AMBULATORY_CARE_PROVIDER_SITE_OTHER): Payer: Medicare HMO | Admitting: Ophthalmology

## 2018-02-07 DIAGNOSIS — H348322 Tributary (branch) retinal vein occlusion, left eye, stable: Secondary | ICD-10-CM

## 2018-02-07 DIAGNOSIS — H353132 Nonexudative age-related macular degeneration, bilateral, intermediate dry stage: Secondary | ICD-10-CM

## 2018-02-07 DIAGNOSIS — H35033 Hypertensive retinopathy, bilateral: Secondary | ICD-10-CM | POA: Diagnosis not present

## 2018-02-07 DIAGNOSIS — H43813 Vitreous degeneration, bilateral: Secondary | ICD-10-CM | POA: Diagnosis not present

## 2018-02-07 DIAGNOSIS — I10 Essential (primary) hypertension: Secondary | ICD-10-CM | POA: Diagnosis not present

## 2018-02-10 ENCOUNTER — Ambulatory Visit (INDEPENDENT_AMBULATORY_CARE_PROVIDER_SITE_OTHER): Payer: Medicare HMO

## 2018-02-10 DIAGNOSIS — I5022 Chronic systolic (congestive) heart failure: Secondary | ICD-10-CM

## 2018-02-10 DIAGNOSIS — Z950 Presence of cardiac pacemaker: Secondary | ICD-10-CM

## 2018-02-11 NOTE — Progress Notes (Signed)
EPIC Encounter for ICM Monitoring  Patient Name: [name] is a 84 y.o. male Date: 02/11/2018 Primary Care Physican: Lawerance Cruel, MD Primary Care Physican: Lawerance Cruel, MD Primary Cardiologist:Varanasi  Electrophysiologist:Taylor  Last Weight: unknown                                                    Transmission reviewed.    Thoracic impedance normal.   Prescribed: Torsemide 20 mg 1 tablet daily. Take 1 extra Torsemide tablet for weight gain of 3 lbs.   Labs: 12/05/2017 Creatinine 1.25, BUN 14, Potassium 4.1, Sodium 137, eGFR 51-59 08/14/2017 Creatinine 1.24, BUN 21, Potassium 4.0, Sodium 139, EGFR 50-58 08/06/2017 Creatinine 1.21, BUN 17, Potassium 4.4, Sodium 141, EGFR 53-61  Recommendations: None  Follow-up plan: ICM clinic phone appointment on 03/17/2018   Copy of ICM check sent to Dr. Lovena Le.   3 month ICM trend: 02/10/2018    1 Year ICM trend:       Rosalene Billings, RN 02/11/2018 10:43 AM

## 2018-02-25 DIAGNOSIS — H01021 Squamous blepharitis right upper eyelid: Secondary | ICD-10-CM | POA: Diagnosis not present

## 2018-02-25 DIAGNOSIS — H01025 Squamous blepharitis left lower eyelid: Secondary | ICD-10-CM | POA: Diagnosis not present

## 2018-02-25 DIAGNOSIS — H532 Diplopia: Secondary | ICD-10-CM | POA: Diagnosis not present

## 2018-02-25 DIAGNOSIS — H02831 Dermatochalasis of right upper eyelid: Secondary | ICD-10-CM | POA: Diagnosis not present

## 2018-02-25 DIAGNOSIS — H401123 Primary open-angle glaucoma, left eye, severe stage: Secondary | ICD-10-CM | POA: Diagnosis not present

## 2018-02-25 DIAGNOSIS — H348322 Tributary (branch) retinal vein occlusion, left eye, stable: Secondary | ICD-10-CM | POA: Diagnosis not present

## 2018-02-25 DIAGNOSIS — H401112 Primary open-angle glaucoma, right eye, moderate stage: Secondary | ICD-10-CM | POA: Diagnosis not present

## 2018-02-25 DIAGNOSIS — M5136 Other intervertebral disc degeneration, lumbar region: Secondary | ICD-10-CM | POA: Diagnosis not present

## 2018-02-25 DIAGNOSIS — H01022 Squamous blepharitis right lower eyelid: Secondary | ICD-10-CM | POA: Diagnosis not present

## 2018-02-25 DIAGNOSIS — H01024 Squamous blepharitis left upper eyelid: Secondary | ICD-10-CM | POA: Diagnosis not present

## 2018-02-25 DIAGNOSIS — M5416 Radiculopathy, lumbar region: Secondary | ICD-10-CM | POA: Diagnosis not present

## 2018-03-11 LAB — CUP PACEART REMOTE DEVICE CHECK
Battery Remaining Percentage: 95.5 %
Battery Voltage: 2.98 V
Implantable Lead Implant Date: 20161205
Implantable Lead Implant Date: 20161205
Implantable Lead Location: 753860
Implantable Pulse Generator Implant Date: 20161205
Lead Channel Impedance Value: 1050 Ohm
Lead Channel Impedance Value: 460 Ohm
Lead Channel Pacing Threshold Amplitude: 1 V
Lead Channel Pacing Threshold Pulse Width: 0.5 ms
Lead Channel Setting Pacing Amplitude: 2 V
Lead Channel Setting Pacing Pulse Width: 0.5 ms
Lead Channel Setting Sensing Sensitivity: 2 mV
MDC IDC LEAD IMPLANT DT: 20161205
MDC IDC LEAD LOCATION: 753858
MDC IDC LEAD LOCATION: 753859
MDC IDC MSMT BATTERY REMAINING LONGEVITY: 98 mo
MDC IDC MSMT LEADCHNL LV PACING THRESHOLD PULSEWIDTH: 0.6 ms
MDC IDC MSMT LEADCHNL RV PACING THRESHOLD AMPLITUDE: 0.75 V
MDC IDC MSMT LEADCHNL RV SENSING INTR AMPL: 12 mV
MDC IDC PG SERIAL: 7802901
MDC IDC SESS DTM: 20191114073811
MDC IDC SET LEADCHNL LV PACING PULSEWIDTH: 0.6 ms
MDC IDC SET LEADCHNL RV PACING AMPLITUDE: 2.5 V

## 2018-03-12 DIAGNOSIS — H4921 Sixth [abducent] nerve palsy, right eye: Secondary | ICD-10-CM | POA: Diagnosis not present

## 2018-03-12 DIAGNOSIS — H50012 Monocular esotropia, left eye: Secondary | ICD-10-CM | POA: Diagnosis not present

## 2018-03-12 DIAGNOSIS — H5000 Unspecified esotropia: Secondary | ICD-10-CM | POA: Diagnosis not present

## 2018-03-12 DIAGNOSIS — H532 Diplopia: Secondary | ICD-10-CM | POA: Diagnosis not present

## 2018-03-17 ENCOUNTER — Ambulatory Visit (INDEPENDENT_AMBULATORY_CARE_PROVIDER_SITE_OTHER): Payer: Medicare HMO

## 2018-03-17 DIAGNOSIS — Z950 Presence of cardiac pacemaker: Secondary | ICD-10-CM | POA: Diagnosis not present

## 2018-03-17 DIAGNOSIS — I5022 Chronic systolic (congestive) heart failure: Secondary | ICD-10-CM

## 2018-03-18 NOTE — Progress Notes (Signed)
EPIC Encounter for ICM Monitoring  Patient Name: MAXX PHAM is a 85 y.o. male Date: 03/18/2018 Primary Care Physican: Lawerance Cruel, MD Primary Care Physican:Ross, Dwyane Luo, MD Primary Cardiologist:Varanasi  Electrophysiologist:Taylor Last Weight: unknown   Heart failure reviewed and patient asymptomatic.   Thoracic impedance normal.  Prescribed:Torsemide 20 mg 1 tablet daily. Take 1 extra Torsemide tablet for weight gain of 3 lbs.   Labs: 12/05/2017 Creatinine 1.25, BUN 14, Potassium 4.1, Sodium 137, eGFR 51-59 08/14/2017 Creatinine 1.24, BUN 21, Potassium 4.0, Sodium 139, EGFR 50-58 08/06/2017 Creatinine 1.21, BUN 17, Potassium 4.4, Sodium 141, EGFR 53-61  Recommendations: No changes.  Encouraged to call for fluid symptoms.  Follow-up plan: ICM clinic phone appointment on2/24/2020  Copy of ICM check sent to Dr.Taylor.    3 month ICM trend: 03/17/2018    1 Year ICM trend:       Rosalene Billings, RN 03/18/2018 12:34 PM

## 2018-03-19 ENCOUNTER — Other Ambulatory Visit: Payer: Self-pay | Admitting: Anesthesiology

## 2018-03-19 ENCOUNTER — Telehealth: Payer: Self-pay | Admitting: Nurse Practitioner

## 2018-03-19 ENCOUNTER — Other Ambulatory Visit: Payer: Self-pay | Admitting: Orthopedic Surgery

## 2018-03-19 DIAGNOSIS — M5416 Radiculopathy, lumbar region: Secondary | ICD-10-CM

## 2018-03-19 NOTE — Telephone Encounter (Signed)
Phone call to patient to verify medication list and allergies for myelogram procedure. Pt instructed to hold tramadol for 48hrs prior to myelogram appointment time. Pt also aware he will need to hold plavix for 5 days prior to myelogram, pending approval from Dr. Cristopher Peru. Pt verbalized understanding. [place] hold request faxed to Dr. Lovena Le, awaiting reply and recommendations.

## 2018-03-20 ENCOUNTER — Telehealth: Payer: Self-pay | Admitting: *Deleted

## 2018-03-20 NOTE — Telephone Encounter (Signed)
   [facility] Pre-operative Risk Assessment    Request for surgical clearance:  1. What type of surgery is being performed? MYELOGRAM   2. When is this surgery scheduled? TBD   3. What type of clearance is required (medical clearance vs. Pharmacy clearance to hold med vs. Both)? MEDICAL  4. Are there any medications that need to be held prior to surgery and how long?PLAVIX X 5 DAYS PRIOR   5. Practice name and name of physician performing surgery? [facility]    6. What is your office phone number [phone]    7.   What is your office fax number [phone]  8.   Anesthesia type (None, local, MAC, general) ? LOCAL   Julaine Hua 03/20/2018, 11:15 AM  _________________________________________________________________   (provider comments below)

## 2018-03-21 NOTE — Telephone Encounter (Signed)
   Primary Cardiologist: Larae Grooms, MD  Chart reviewed as part of pre-operative protocol coverage. Given past medical history and time since last visit, based on ACC/AHA guidelines, [name] would be at acceptable risk for the planned procedure without further cardiovascular testing.   OK to hold Plavix 5 days pre op.  I will route this recommendation to the requesting party via Epic fax function and remove from pre-op pool.  Please call with questions.  Kerin Ransom, PA-C 03/21/2018, 2:32 PM

## 2018-03-24 ENCOUNTER — Telehealth: Payer: Self-pay | Admitting: Interventional Cardiology

## 2018-03-24 NOTE — Telephone Encounter (Signed)
  1. Has your device fired? Patient states device is "kicking" and he can fill it "thumping" in his chest  2. Is you device beeping? no  3. Are you experiencing draining or swelling at device site? no  4. Are you calling to see if we received your device transmission? no  5. Have you passed out? no  Patient states he is not in any pain   Please route to [place]

## 2018-03-25 NOTE — Telephone Encounter (Signed)
Spoke with pt regarding "thumping", pt stated that he has not had any thumping today,pt did not want to come into the office vet and make device programming changes d/t every time changes are made pt has problems with diaphragmatic stimulation. Pt stated that he would wait it out a couple of days and would call back if he began to experience the "thumping" again.

## 2018-03-31 ENCOUNTER — Ambulatory Visit
Admission: RE | Admit: 2018-03-31 | Discharge: 2018-03-31 | Disposition: A | Discharge status: Home / Self Care | Payer: Medicare HMO | Source: Ambulatory Visit | Attending: Anesthesiology | Admitting: Anesthesiology

## 2018-03-31 DIAGNOSIS — M5416 Radiculopathy, lumbar region: Secondary | ICD-10-CM

## 2018-03-31 DIAGNOSIS — M48061 Spinal stenosis, lumbar region without neurogenic claudication: Secondary | ICD-10-CM | POA: Diagnosis not present

## 2018-03-31 MED ORDER — IOPAMIDOL (ISOVUE-M 200) INJECTION 41%
15.0000 mL | Freq: Once | INTRAMUSCULAR | Status: AC
  Administered 2018-03-31: 15 mL via INTRATHECAL

## 2018-03-31 MED ORDER — DIAZEPAM 5 MG PO TABS
5.0000 mg | ORAL_TABLET | Freq: Once | ORAL | Status: DC
Start: 2018-03-31 — End: 2018-04-01

## 2018-03-31 NOTE — Progress Notes (Signed)
Patient states he has been off Tramadol for at least two days and Plavix for at least five days.

## 2018-03-31 NOTE — Discharge Instructions (Signed)
Myelogram Discharge Instructions  1. Go home and rest quietly for the next 24 hours.  It is important to lie flat for the next 24 hours.  Get up only to go to the restroom.  You may lie in the bed or on a couch on your back, your stomach, your left side or your right side.  You may have one pillow under your head.  You may have pillows between your knees while you are on your side or under your knees while you are on your back.  2. DO NOT drive today.  Recline the seat as far back as it will go, while still wearing your seat belt, on the way home.  3. You may get up to go to the bathroom as needed.  You may sit up for 10 minutes to eat.  You may resume your normal diet and medications unless otherwise indicated.  Drink lots of extra fluids today and tomorrow.  4. The incidence of headache, nausea, or vomiting is about 5% (one in 20 patients).  If you develop a headache, lie flat and drink plenty of fluids until the headache goes away.  Caffeinated beverages may be helpful.  If you develop severe nausea and vomiting or a headache that does not go away with flat bed rest, call [phone].  5. You may resume normal activities after your 24 hours of bed rest is over; however, do not exert yourself strongly or do any heavy lifting tomorrow. If when you get up you have a headache when standing, go back to bed and force fluids for another 24 hours.  6. Call your physician for a follow-up appointment.  The results of your myelogram will be sent directly to your physician by the following day.  7. If you have any questions or if complications develop after you arrive home, please call [phone].  Discharge instructions have been explained to the patient.  The patient, or the person responsible for the patient, fully understands these instructions.  YOU MAY RESTART YOUR PLAVIX TODAY. YOU MAY RESTART YOUR TRAMADOL TOMORROW 04/01/2018 AT 09:30AM.

## 2018-04-09 DIAGNOSIS — M5416 Radiculopathy, lumbar region: Secondary | ICD-10-CM | POA: Diagnosis not present

## 2018-04-10 ENCOUNTER — Ambulatory Visit (INDEPENDENT_AMBULATORY_CARE_PROVIDER_SITE_OTHER): Payer: Medicare HMO

## 2018-04-10 DIAGNOSIS — I442 Atrioventricular block, complete: Secondary | ICD-10-CM

## 2018-04-11 ENCOUNTER — Telehealth: Payer: Self-pay

## 2018-04-11 NOTE — Telephone Encounter (Signed)
Spoke with patient to remind of missed remote transmission 

## 2018-04-14 ENCOUNTER — Ambulatory Visit (INDEPENDENT_AMBULATORY_CARE_PROVIDER_SITE_OTHER): Payer: Medicare HMO

## 2018-04-14 DIAGNOSIS — I5022 Chronic systolic (congestive) heart failure: Secondary | ICD-10-CM

## 2018-04-14 DIAGNOSIS — I442 Atrioventricular block, complete: Secondary | ICD-10-CM

## 2018-04-15 LAB — CUP PACEART REMOTE DEVICE CHECK
Date Time Interrogation Session: 20200216061921
Implantable Lead Implant Date: 20161205
Implantable Lead Location: 753859
Implantable Pulse Generator Implant Date: 20161205
Lead Channel Impedance Value: 460 Ohm
Lead Channel Pacing Threshold Pulse Width: 0.6 ms
Lead Channel Sensing Intrinsic Amplitude: 12 mV
Lead Channel Setting Pacing Amplitude: 2.5 V
Lead Channel Setting Pacing Pulse Width: 0.6 ms
Lead Channel Setting Sensing Sensitivity: 2 mV
MDC IDC LEAD IMPLANT DT: 20161205
MDC IDC LEAD IMPLANT DT: 20161205
MDC IDC LEAD LOCATION: 753858
MDC IDC LEAD LOCATION: 753860
MDC IDC MSMT BATTERY REMAINING LONGEVITY: 98 mo
MDC IDC MSMT BATTERY REMAINING PERCENTAGE: 95.5 %
MDC IDC MSMT BATTERY VOLTAGE: 2.98 V
MDC IDC MSMT LEADCHNL LV IMPEDANCE VALUE: 1075 Ohm
MDC IDC MSMT LEADCHNL LV PACING THRESHOLD AMPLITUDE: 1 V
MDC IDC MSMT LEADCHNL RV PACING THRESHOLD AMPLITUDE: 0.75 V
MDC IDC MSMT LEADCHNL RV PACING THRESHOLD PULSEWIDTH: 0.5 ms
MDC IDC SET LEADCHNL LV PACING AMPLITUDE: 2 V
MDC IDC SET LEADCHNL RV PACING PULSEWIDTH: 0.5 ms
Pulse Gen Model: 3262
Pulse Gen Serial Number: 7802901

## 2018-04-16 DIAGNOSIS — M5416 Radiculopathy, lumbar region: Secondary | ICD-10-CM | POA: Diagnosis not present

## 2018-04-18 ENCOUNTER — Ambulatory Visit: Payer: Medicare HMO | Admitting: Internal Medicine

## 2018-04-18 ENCOUNTER — Encounter: Payer: Self-pay | Admitting: Internal Medicine

## 2018-04-18 VITALS — BP 114/64 | HR 47 | Ht 67.0 in | Wt 187.0 lb

## 2018-04-18 DIAGNOSIS — I5022 Chronic systolic (congestive) heart failure: Secondary | ICD-10-CM

## 2018-04-18 DIAGNOSIS — Z950 Presence of cardiac pacemaker: Secondary | ICD-10-CM | POA: Diagnosis not present

## 2018-04-18 DIAGNOSIS — I480 Paroxysmal atrial fibrillation: Secondary | ICD-10-CM | POA: Diagnosis not present

## 2018-04-18 NOTE — Progress Notes (Signed)
HPI [name] returns today for followup. He is a pleasant elderly man with symptomatic bradycardia, chronic systolic heart failure with near normalization of his LV function, lung disease, HTN, and is s/p Biv PPM insertion. In the interim, he has been stable.  He denies chest pain. His chf symptoms are class 2. No syncope.  Allergies  Allergen Reactions  . Codeine Anxiety and Other (See Comments)    Insomnia/ hyper Insomnia/ hyper  . Omeprazole Nausea And Vomiting and Other (See Comments)    GI upset, insomnia  GI upset, insomnia GI upset, insomnia GI upset, insomnia  . Pantoprazole Nausea Only and Other (See Comments)    Gi upset, insomnia  . Pantoprazole Sodium Nausea Only and Other (See Comments)    Gi upset, insomnia   . Warfarin Sodium Anxiety and Other (See Comments)    Hx gi bleed  Hx gi bleed Hx gi bleed     Current Outpatient Medications  Medication Sig Dispense Refill  . acetaminophen (TYLENOL) 500 MG tablet Take 1,000 mg by mouth every 6 (six) hours as needed for headache (pain).     . brimonidine (ALPHAGAN) 0.15 % ophthalmic solution Place 1 drop into both eyes 2 (two) times daily.    . clopidogrel (PLAVIX) 75 MG tablet TAKE 1 TABLET EVERY DAY 90 tablet 2  . diazepam (VALIUM) 5 MG tablet Take 1 tablet (5 mg total) by mouth every 12 (twelve) hours as needed for anxiety. 5 tablet 0  . dorzolamide-timolol (COSOPT) 22.3-6.8 MG/ML ophthalmic solution Place 1 drop into both eyes 2 (two) times daily.   2  . finasteride (PROSCAR) 5 MG tablet Take 1 tablet (5 mg total) by mouth daily. 30 tablet 6  . fluticasone (FLONASE) 50 MCG/ACT nasal spray Place 2 sprays into both nostrils daily as needed for allergies.   1  . furosemide (LASIX) 20 MG tablet Take 20 mg by mouth.    . Multiple Vitamins-Minerals (EMERGEN-C VITAMIN C) PACK Take 1,000 mg by mouth daily after lunch.    . Multiple Vitamins-Minerals (PRESERVISION AREDS 2) CAPS Take 1 capsule by mouth daily after  lunch.    . nitroGLYCERIN (NITROSTAT) 0.4 MG SL tablet Place 1 tablet (0.4 mg total) under the tongue every 5 (five) minutes as needed for chest pain. 20 tablet 0  . Omega-3 Fatty Acids (FISH OIL PO) Take 1 capsule by mouth daily after lunch.    . potassium chloride (K-DUR,KLOR-CON) 10 MEQ tablet TAKE 1 TABLET EVERY DAY AS NEEDED. WHEN YOU TAKE YOUR FUROSEMIDE. 90 tablet 2  . Probiotic Product (PROBIOTIC PO) Take 1 tablet by mouth 2 (two) times daily.     . psyllium (METAMUCIL) 58.6 % packet Take 1 packet by mouth daily with supper. Mix in 8 oz liquid and drink    . torsemide (DEMADEX) 20 MG tablet Take one extra tablet of torsemide by mouth if weight gain of 3 pounds 30 tablet 3  . traMADol (ULTRAM) 50 MG tablet Take 50 mg as needed by mouth.     No current facility-administered medications for this visit.      Past Medical History:  Diagnosis Date  . Anxiety   . Arthritis   . Atrial fibrillation, permanent 01/07/2016  . Blood transfusion    "related to a surgery"  . BPH (benign prostatic hypertrophy) with urinary obstruction    s/p turp yrs ago  . Bradycardia    a. Amio d/c'd 08/2013; brady arrest 08/2013 after PCI >>> recurrent  AF >>> Amiodarone restarted. b. Pacemaker being considered in 11/2014.  [name] CAD (coronary artery disease)    a. s/p MI and prior PCI of LAD;  b. LHC (08/2013):  prox LAD 60-70%, mid LAD stents ok, ostial lesion at Dx jailed by stent, mild CFX and RCA disesase >>>  PCI (09/08/13):  rotational atherectomy + Promus DES to prox LAD  . Cardiomyopathy ([place])    a. Echo (08/2013):  EF 30-35%, AS hypokinesis, Gr 1 diast dysfn, mild MR, mild LAE >>> b. improved EF 50-55% by echo 8/15. c. EF down again by echo 12/2014 to 30-35% but 51% by nuc.  [name] Chronic lower back pain   . Chronic systolic CHF (congestive heart failure) ([place])   . CKD (chronic kidney disease), stage III ([place])    a. Per review of labs baseline Cr 1.1-1.3.  [name] DDD (degenerative disc disease)    chronic back  pain  . Depression   . Diverticulitis of colon with bleeding    s/p sigmoid resection '88  . DJD (degenerative joint disease) hips and knees   s/p bilateral total replacements  . GERD (gastroesophageal reflux disease)    occ. take prevacid  . History of GI diverticular bleed april 2012   transfused blood and resolved without surgical intervention  . Hypertension   . Impaired hearing bilateral    only left hearing aid  . Impotence, organic    s/p penile prosthesis 1990's  . Incomplete bladder emptying   . LBBB (left bundle branch block)   . Meningioma ([place]) right -sided w/ right VI palsy   followed by dr Gaynell Face  . Mitral regurgitation    a. Mild-mod by echo 12/2014.  [name] Myocardial infarction [facility]) 1980's- medical intervention   "so mild I didn't know I'd had it"  . PAF (paroxysmal atrial fibrillation) ([facility])    not on coumadin due to hx of GI bleed.  . Prostate cancer ([place]) 11/30/13   Gleason 8, volume 22.14 cc  . Sleep apnea    non-compliant cpap    ROS:   All systems reviewed and negative except as noted in the HPI.   Past Surgical History:  Procedure Laterality Date  . APPENDECTOMY    . CARDIAC CATHETERIZATION  2007   noncritical cad (results w/ chart)  . CARDIOVERSION N/A 10/13/2015   Procedure: CARDIOVERSION;  Surgeon: Josue Hector, MD;  Location: [facility];  Service: Cardiovascular;  Laterality: N/A;  . CATARACT EXTRACTION W/ INTRAOCULAR LENS  IMPLANT, BILATERAL Bilateral ~ 2000  . CHOLECYSTECTOMY    . CLOSED REDUCTION HIP DISLOCATION Right "several"  . CORONARY ANGIOPLASTY WITH STENT PLACEMENT  08-03-08   drug-eluting stent x2 distal and mid lad  . EP IMPLANTABLE DEVICE N/A 01/31/2015   Procedure: BiV Pacemaker Insertion CRT-P;  Surgeon: Evans Lance, MD;  Location: [place] CV LAB;  Service: Cardiovascular;  Laterality: N/A;  . EP IMPLANTABLE DEVICE N/A 02/07/2015   Procedure: Lead Revision/Repair;  Surgeon: Deboraha Sprang, MD;  Location: [facility]  CV LAB;  Service: Cardiovascular;  Laterality: N/A;  . ESOPHAGOGASTRODUODENOSCOPY N/A 02/11/2014   Procedure: ESOPHAGOGASTRODUODENOSCOPY (EGD);  Surgeon: Cleotis Nipper, MD;  Location: [facility] ENDOSCOPY;  Service: Endoscopy;  Laterality: N/A;  . gamma knife radiation  2000   [facility] for meningioma, last eval 2013- no change  . INGUINAL HERNIA REPAIR Bilateral   . INNER EAR SURGERY Right yrs ago   "trying to get my hearing back  . LEFT HEART CATHETERIZATION WITH CORONARY ANGIOGRAM N/A  09/04/2013   Procedure: LEFT HEART CATHETERIZATION WITH CORONARY ANGIOGRAM;  Surgeon: Jettie Booze, MD;  Location: [facility] CATH LAB;  Service: Cardiovascular;  Laterality: N/A;  . PENILE PROSTHESIS IMPLANT  1990's  . PERCUTANEOUS CORONARY ROTOBLATOR INTERVENTION (PCI-R) N/A 09/08/2013   Procedure: PERCUTANEOUS CORONARY ROTOBLATOR INTERVENTION (PCI-R);  Surgeon: Jettie Booze, MD;  Location: [facility] CATH LAB;  Service: Cardiovascular;  Laterality: N/A;  . PROSTATE BIOPSY  11/30/13   Gleason 8, vol 22.14 cc  . REVISION TOTAL KNEE ARTHROPLASTY Right   . SHOULDER OPEN ROTATOR CUFF REPAIR Left   . TOTAL HIP ARTHROPLASTY Right 03-25-08--  . TOTAL HIP ARTHROPLASTY Left 2005  . TOTAL HIP REVISION Right 3-4 times  . TOTAL KNEE ARTHROPLASTY Right 2004  . TOTAL KNEE ARTHROPLASTY Left 1997  . TRANSURETHRAL RESECTION OF PROSTATE  "years ago"  . TRANSURETHRAL RESECTION OF PROSTATE  01/08/2011   Procedure: TRANSURETHRAL RESECTION OF THE PROSTATE (TURP);  Surgeon: Franchot Gallo;  Location: [facility];  Service: Urology;  Laterality: N/A;  GYRUS      Family History  Problem Relation Age of Onset  . Heart disease Mother   . Heart attack Mother   . Emphysema Father   . Cancer Brother        liver cancer  . Cancer Unknown      Social History   Socioeconomic History  . Marital status: Widowed    Spouse name: Not on file  . Number of children: Not on file  . Years of education: Not on file  .  Highest education level: Not on file  Occupational History  . Not on file  Social Needs  . Financial resource strain: Not on file  . Food insecurity:    Worry: Not on file    Inability: Not on file  . Transportation needs:    Medical: Not on file    Non-medical: Not on file  Tobacco Use  . Smoking status: Former Smoker    Packs/day: 1.00    Years: 14.00    Pack years: 14.00    Types: Cigarettes    Last attempt to quit: 01/05/1959    Years since quitting: 59.3  . Smokeless tobacco: Never Used  Substance and Sexual Activity  . Alcohol use: Yes    Comment: 02/09/2014 "I'm not drinking anymore"  . Drug use: No  . Sexual activity: Never  Lifestyle  . Physical activity:    Days per week: Not on file    Minutes per session: Not on file  . Stress: Not on file  Relationships  . Social connections:    Talks on phone: Not on file    Gets together: Not on file    Attends religious service: Not on file    Active member of club or organization: Not on file    Attends meetings of clubs or organizations: Not on file    Relationship status: Not on file  . Intimate partner violence:    Fear of current or ex partner: Not on file    Emotionally abused: Not on file    Physically abused: Not on file    Forced sexual activity: Not on file  Other Topics Concern  . Not on file  Social History Narrative  . Not on file     BP 114/64   Pulse (!) 47   Ht 5\' 7"  (1.702 m)   Wt 187 lb (84.8 kg)   SpO2 98%   BMI 29.29 kg/m   Physical  Exam:  Well appearing NAD HEENT: Unremarkable Neck:  No JVD, no thyromegally Lymphatics:  No adenopathy Back:  No CVA tenderness Lungs:  Clear with no wheezes HEART:  Regular rate rhythm, no murmurs, no rubs, no clicks Abd:  soft, positive bowel sounds, no organomegally, no rebound, no guarding Ext:  2 plus pulses, no edema, no cyanosis, no clubbing Skin:  No rashes no nodules Neuro:  CN II through XII intact, motor grossly intact  DEVICE  Normal  device function.  See PaceArt for details.   Assess/Plan: 1. Atrial fib/flutter - his rates are mostly controlled.  2. Chronic systolic/diastolic heart failure - his symptoms remain class 2.  3. PPM -his [employer] Biv PPM is working normally.   [name].D.

## 2018-04-18 NOTE — Patient Instructions (Signed)
Medication Instructions:  Your physician recommends that you continue on your current medications as directed. Please refer to the Current Medication list given to you today.  Labwork: None ordered.  Testing/Procedures: None ordered.  Follow-Up: Your physician wants you to follow-up in: one year with Dr. Lovena Le.   You will receive a reminder letter in the mail two months in advance. If you don't receive a letter, please call our office to schedule the follow-up appointment.  Remote monitoring is used to monitor your Pacemaker from home. This monitoring reduces the number of office visits required to check your device to one time per year. It allows [place] to keep an eye on the functioning of your device to ensure it is working properly. You are scheduled for a device check from home on 04/21/2018. You may send your transmission at any time that day. If you have a wireless device, the transmission will be sent automatically. After your physician reviews your transmission, you will receive a postcard with your next transmission date.  Any Other Special Instructions Will Be Listed Below (If Applicable).  If you need a refill on your cardiac medications before your next appointment, please call your pharmacy.

## 2018-04-19 ENCOUNTER — Encounter: Payer: Self-pay | Admitting: Internal Medicine

## 2018-04-21 ENCOUNTER — Other Ambulatory Visit: Payer: Self-pay

## 2018-04-21 DIAGNOSIS — I25118 Atherosclerotic heart disease of native coronary artery with other forms of angina pectoris: Secondary | ICD-10-CM

## 2018-04-21 DIAGNOSIS — K219 Gastro-esophageal reflux disease without esophagitis: Secondary | ICD-10-CM | POA: Diagnosis not present

## 2018-04-21 LAB — CUP PACEART INCLINIC DEVICE CHECK
Battery Remaining Longevity: 99 mo
Battery Voltage: 2.98 V
Implantable Lead Location: 753859
Implantable Lead Location: 753860
Lead Channel Impedance Value: 1050 Ohm
Lead Channel Impedance Value: 462.5 Ohm
Lead Channel Pacing Threshold Amplitude: 0.75 V
Lead Channel Pacing Threshold Amplitude: 1 V
Lead Channel Pacing Threshold Pulse Width: 0.5 ms
Lead Channel Pacing Threshold Pulse Width: 0.6 ms
Lead Channel Pacing Threshold Pulse Width: 0.6 ms
Lead Channel Sensing Intrinsic Amplitude: 12 mV
Lead Channel Sensing Intrinsic Amplitude: 2 mV
Lead Channel Setting Pacing Pulse Width: 0.8 ms
Lead Channel Setting Sensing Sensitivity: 2 mV
MDC IDC LEAD IMPLANT DT: 20161205
MDC IDC LEAD IMPLANT DT: 20161205
MDC IDC LEAD IMPLANT DT: 20161205
MDC IDC LEAD LOCATION: 753858
MDC IDC MSMT LEADCHNL LV PACING THRESHOLD AMPLITUDE: 1 V
MDC IDC MSMT LEADCHNL RV PACING THRESHOLD AMPLITUDE: 0.75 V
MDC IDC MSMT LEADCHNL RV PACING THRESHOLD PULSEWIDTH: 0.5 ms
MDC IDC PG IMPLANT DT: 20161205
MDC IDC PG SERIAL: 7802901
MDC IDC SESS DTM: 20200221211807
MDC IDC SET LEADCHNL LV PACING AMPLITUDE: 1.75 V
MDC IDC SET LEADCHNL RV PACING AMPLITUDE: 2.5 V
MDC IDC SET LEADCHNL RV PACING PULSEWIDTH: 0.5 ms
MDC IDC STAT BRADY RA PERCENT PACED: 0 %
MDC IDC STAT BRADY RV PERCENT PACED: 71 %

## 2018-04-21 MED ORDER — NITROGLYCERIN 0.4 MG SL SUBL: 0.4000 mg | SUBLINGUAL_TABLET | SUBLINGUAL | 5 refills | Status: DC | PRN

## 2018-04-22 NOTE — Progress Notes (Signed)
Remote pacemaker transmission.   

## 2018-04-23 ENCOUNTER — Telehealth: Payer: Self-pay

## 2018-04-23 NOTE — Progress Notes (Signed)
Remote pacemaker transmission.   

## 2018-04-23 NOTE — Telephone Encounter (Signed)
Spoke with patient to remind of missed remote transmission 

## 2018-04-25 NOTE — Progress Notes (Signed)
No ICM remote transmission received for 04/21/2018 and next ICM transmission scheduled for 05/12/2018.

## 2018-04-26 DIAGNOSIS — R69 Illness, unspecified: Secondary | ICD-10-CM | POA: Diagnosis not present

## 2018-05-12 ENCOUNTER — Other Ambulatory Visit: Payer: Self-pay

## 2018-05-13 ENCOUNTER — Telehealth: Payer: Self-pay

## 2018-05-13 NOTE — Telephone Encounter (Signed)
Left message for patient to remind of missed remote transmission.  

## 2018-05-19 NOTE — Progress Notes (Signed)
No ICM remote transmission received for 05/12/2018 and next ICM transmission scheduled for 05/27/2018.

## 2018-05-27 ENCOUNTER — Ambulatory Visit (INDEPENDENT_AMBULATORY_CARE_PROVIDER_SITE_OTHER): Payer: Medicare HMO

## 2018-05-27 ENCOUNTER — Other Ambulatory Visit: Payer: Self-pay

## 2018-05-27 DIAGNOSIS — Z950 Presence of cardiac pacemaker: Secondary | ICD-10-CM | POA: Diagnosis not present

## 2018-05-27 DIAGNOSIS — I5022 Chronic systolic (congestive) heart failure: Secondary | ICD-10-CM

## 2018-05-27 NOTE — Progress Notes (Signed)
EPIC Encounter for ICM Monitoring  Patient Name: [name] is a 85 y.o. male Date: 05/27/2018 Primary Care Physican: Lawerance Cruel, MD Primary Cardiologist:Varanasi  Electrophysiologist:Taylor Last Weight: unknown   Heart failure reviewed and patient asymptomatic.   He said he generally feels tired but no fluid symptoms.   Thoracic impedance abnormal suggesting fluid accumulation since 05/22/2018 but is trending back toward baseline.  Prescribed:Torsemide 20 mg 1 tablet daily. Take 1 extra Torsemide tablet for weight gain of 3 lbs.   Labs: 12/05/2017 Creatinine 1.25, BUN 14, Potassium 4.1, Sodium 137, eGFR 51-59 08/14/2017 Creatinine 1.24, BUN 21, Potassium 4.0, Sodium 139, EGFR 50-58 08/06/2017 Creatinine 1.21, BUN 17, Potassium 4.4, Sodium 141, EGFR 53-61  Recommendations: Advised to take 1 extra Torsemide tomorrow morning and then return to 1 tablet daily.  Encouraged to call for fluid symptoms.  Follow-up plan: ICM clinic phone appointment on4/13/2020 to recheck fluid levels.   Copy of ICM check sent to Dr.Taylor and Dr Irish Lack.    Direct Trend Viewer   3 month ICM trend: 05/27/2018    1 Year ICM trend:       Rosalene Billings, RN 05/27/2018 3:49 PM

## 2018-06-09 ENCOUNTER — Ambulatory Visit (INDEPENDENT_AMBULATORY_CARE_PROVIDER_SITE_OTHER): Payer: Medicare HMO

## 2018-06-09 ENCOUNTER — Other Ambulatory Visit: Payer: Self-pay

## 2018-06-09 DIAGNOSIS — Z950 Presence of cardiac pacemaker: Secondary | ICD-10-CM

## 2018-06-09 DIAGNOSIS — I5022 Chronic systolic (congestive) heart failure: Secondary | ICD-10-CM

## 2018-06-10 NOTE — Progress Notes (Signed)
EPIC Encounter for ICM Monitoring  Patient Name: [name] is a 85 y.o. male Date: 06/10/2018 Primary Care Physican: Lawerance Cruel, MD Primary Cardiologist:Varanasi  Electrophysiologist:Taylor Last Weight: unknown   Heart failure reviewed and patient asymptomatic.    Thoracic impedance returned to normal.  Prescribed:Torsemide 20 mg 1 tablet daily. Take 1 extra Torsemide tablet for weight gain of 3 lbs.   Labs: 12/05/2017 Creatinine 1.25, BUN 14, Potassium 4.1, Sodium 137, eGFR 51-59 08/14/2017 Creatinine 1.24, BUN 21, Potassium 4.0, Sodium 139, EGFR 50-58 08/06/2017 Creatinine 1.21, BUN 17, Potassium 4.4, Sodium 141, EGFR 53-61  Recommendations:Encouraged to call for fluid symptoms.  Follow-up plan: ICM clinic phone appointment on5/06/2018.   Copy of ICM check sent to Dr.Taylor.   3 month ICM trend: 06/09/2018    1 Year ICM trend:       Rosalene Billings, RN 06/10/2018 12:08 PM

## 2018-07-01 ENCOUNTER — Ambulatory Visit (INDEPENDENT_AMBULATORY_CARE_PROVIDER_SITE_OTHER): Payer: Medicare HMO

## 2018-07-01 ENCOUNTER — Other Ambulatory Visit: Payer: Self-pay

## 2018-07-01 DIAGNOSIS — I5022 Chronic systolic (congestive) heart failure: Secondary | ICD-10-CM | POA: Diagnosis not present

## 2018-07-01 DIAGNOSIS — Z950 Presence of cardiac pacemaker: Secondary | ICD-10-CM

## 2018-07-04 DIAGNOSIS — Z961 Presence of intraocular lens: Secondary | ICD-10-CM | POA: Diagnosis not present

## 2018-07-04 DIAGNOSIS — H401112 Primary open-angle glaucoma, right eye, moderate stage: Secondary | ICD-10-CM | POA: Diagnosis not present

## 2018-07-04 DIAGNOSIS — H532 Diplopia: Secondary | ICD-10-CM | POA: Diagnosis not present

## 2018-07-04 DIAGNOSIS — H0102B Squamous blepharitis left eye, upper and lower eyelids: Secondary | ICD-10-CM | POA: Diagnosis not present

## 2018-07-04 DIAGNOSIS — H401123 Primary open-angle glaucoma, left eye, severe stage: Secondary | ICD-10-CM | POA: Diagnosis not present

## 2018-07-04 DIAGNOSIS — H0102A Squamous blepharitis right eye, upper and lower eyelids: Secondary | ICD-10-CM | POA: Diagnosis not present

## 2018-07-04 NOTE — Progress Notes (Signed)
EPIC Encounter for ICM Monitoring  Patient Name: [name] is a 85 y.o. male Date: 07/04/2018 Primary Care Physican: Lawerance Cruel, MD Primary Cardiologist:Varanasi  Electrophysiologist:Taylor Last Weight: does not weigh   Heart failure reviewed and patient he generally feels like he has fluid but no specific symptoms.  CorVue Thoracic impedanceabnormal suggesting fluid accumulation since 06/27/2018.  Prescribed:Torsemide 20 mg 1 tablet daily. Take 1 extra Torsemide tablet for weight gain of 3 lbs.   Labs: 12/05/2017 Creatinine 1.25, BUN 14, Potassium 4.1, Sodium 137, eGFR 51-59 08/14/2017 Creatinine 1.24, BUN 21, Potassium 4.0, Sodium 139, EGFR 50-58 08/06/2017 Creatinine 1.21, BUN 17, Potassium 4.4, Sodium 141, EGFR 53-61  Recommendations:Patient said he will take extra Torsemide 1-2 days  Follow-up plan: ICM clinic phone appointment on5/18/2020 to recheck fluid levels.  Copy of ICM check sent to Dr.Taylor and Dr Irish Lack.   3 month ICM trend: 07/01/2018   1 year ICM trend:   Rosalene Billings, RN 07/04/2018 8:47 AM

## 2018-07-10 DIAGNOSIS — M25512 Pain in left shoulder: Secondary | ICD-10-CM | POA: Diagnosis not present

## 2018-07-10 DIAGNOSIS — M25511 Pain in right shoulder: Secondary | ICD-10-CM | POA: Diagnosis not present

## 2018-07-14 ENCOUNTER — Ambulatory Visit (INDEPENDENT_AMBULATORY_CARE_PROVIDER_SITE_OTHER): Payer: Medicare HMO

## 2018-07-14 ENCOUNTER — Other Ambulatory Visit: Payer: Self-pay

## 2018-07-14 ENCOUNTER — Ambulatory Visit (INDEPENDENT_AMBULATORY_CARE_PROVIDER_SITE_OTHER): Payer: Medicare HMO | Admitting: *Deleted

## 2018-07-14 DIAGNOSIS — Z950 Presence of cardiac pacemaker: Secondary | ICD-10-CM

## 2018-07-14 DIAGNOSIS — I442 Atrioventricular block, complete: Secondary | ICD-10-CM | POA: Diagnosis not present

## 2018-07-14 DIAGNOSIS — I5022 Chronic systolic (congestive) heart failure: Secondary | ICD-10-CM

## 2018-07-15 LAB — CUP PACEART REMOTE DEVICE CHECK
Date Time Interrogation Session: 20200519100512
Implantable Lead Implant Date: 20161205
Implantable Lead Implant Date: 20161205
Implantable Lead Implant Date: 20161205
Implantable Lead Location: 753858
Implantable Lead Location: 753859
Implantable Lead Location: 753860
Implantable Pulse Generator Implant Date: 20161205
Pulse Gen Model: 3262
Pulse Gen Serial Number: 7802901

## 2018-07-16 DIAGNOSIS — L821 Other seborrheic keratosis: Secondary | ICD-10-CM | POA: Diagnosis not present

## 2018-07-16 DIAGNOSIS — D044 Carcinoma in situ of skin of scalp and neck: Secondary | ICD-10-CM | POA: Diagnosis not present

## 2018-07-16 DIAGNOSIS — L570 Actinic keratosis: Secondary | ICD-10-CM | POA: Diagnosis not present

## 2018-07-16 DIAGNOSIS — D1801 Hemangioma of skin and subcutaneous tissue: Secondary | ICD-10-CM | POA: Diagnosis not present

## 2018-07-16 DIAGNOSIS — L308 Other specified dermatitis: Secondary | ICD-10-CM | POA: Diagnosis not present

## 2018-07-16 DIAGNOSIS — Z85828 Personal history of other malignant neoplasm of skin: Secondary | ICD-10-CM | POA: Diagnosis not present

## 2018-07-16 DIAGNOSIS — C44319 Basal cell carcinoma of skin of other parts of face: Secondary | ICD-10-CM | POA: Diagnosis not present

## 2018-07-16 NOTE — Progress Notes (Signed)
EPIC Encounter for ICM Monitoring  Patient Name: [name] is a 85 y.o. male Date: 07/16/2018 Primary Care Physican: Lawerance Cruel, MD Primary Cardiologist:Varanasi  Electrophysiologist:Taylor Last Weight: does not weigh   Heart failure reviewed and patient he asymptomatic.  CorVue Thoracic impedancereturned to normal after taking extra Torsemide.  Prescribed:Torsemide 20 mg 1 tablet daily. Take 1 extra Torsemide tablet for weight gain of 3 lbs.   Labs: 12/05/2017 Creatinine 1.25, BUN 14, Potassium 4.1, Sodium 137, eGFR 51-59 08/14/2017 Creatinine 1.24, BUN 21, Potassium 4.0, Sodium 139, EGFR 50-58 08/06/2017 Creatinine 1.21, BUN 17, Potassium 4.4, Sodium 141, EGFR 53-61  Recommendations:No changes.  Follow-up plan: ICM clinic phone appointment on6/09/2018.  Copy of ICM check sent to Dr.Taylor.  3 month ICM trend: 07/14/2018    1 Year ICM trend:       Rosalene Billings, RN 07/16/2018 9:28 AM

## 2018-07-22 DIAGNOSIS — L244 Irritant contact dermatitis due to drugs in contact with skin: Secondary | ICD-10-CM | POA: Diagnosis not present

## 2018-07-22 DIAGNOSIS — D044 Carcinoma in situ of skin of scalp and neck: Secondary | ICD-10-CM | POA: Diagnosis not present

## 2018-07-22 DIAGNOSIS — C44319 Basal cell carcinoma of skin of other parts of face: Secondary | ICD-10-CM | POA: Diagnosis not present

## 2018-07-22 DIAGNOSIS — Z85828 Personal history of other malignant neoplasm of skin: Secondary | ICD-10-CM | POA: Diagnosis not present

## 2018-07-23 ENCOUNTER — Encounter: Payer: Self-pay | Admitting: Cardiology

## 2018-07-23 NOTE — Progress Notes (Signed)
Remote pacemaker transmission.   

## 2018-07-31 ENCOUNTER — Other Ambulatory Visit: Payer: Self-pay

## 2018-07-31 ENCOUNTER — Encounter (INDEPENDENT_AMBULATORY_CARE_PROVIDER_SITE_OTHER): Payer: Medicare HMO | Admitting: Ophthalmology

## 2018-07-31 DIAGNOSIS — H43813 Vitreous degeneration, bilateral: Secondary | ICD-10-CM

## 2018-07-31 DIAGNOSIS — H348322 Tributary (branch) retinal vein occlusion, left eye, stable: Secondary | ICD-10-CM | POA: Diagnosis not present

## 2018-07-31 DIAGNOSIS — H353132 Nonexudative age-related macular degeneration, bilateral, intermediate dry stage: Secondary | ICD-10-CM | POA: Diagnosis not present

## 2018-07-31 DIAGNOSIS — I10 Essential (primary) hypertension: Secondary | ICD-10-CM

## 2018-07-31 DIAGNOSIS — H35033 Hypertensive retinopathy, bilateral: Secondary | ICD-10-CM

## 2018-08-04 ENCOUNTER — Ambulatory Visit (INDEPENDENT_AMBULATORY_CARE_PROVIDER_SITE_OTHER): Payer: Medicare HMO

## 2018-08-04 DIAGNOSIS — Z950 Presence of cardiac pacemaker: Secondary | ICD-10-CM

## 2018-08-04 DIAGNOSIS — I5022 Chronic systolic (congestive) heart failure: Secondary | ICD-10-CM

## 2018-08-05 ENCOUNTER — Telehealth (HOSPITAL_COMMUNITY): Payer: Self-pay

## 2018-08-05 NOTE — Telephone Encounter (Signed)

## 2018-08-06 ENCOUNTER — Other Ambulatory Visit: Payer: Self-pay

## 2018-08-06 ENCOUNTER — Telehealth: Payer: Self-pay

## 2018-08-06 ENCOUNTER — Ambulatory Visit (HOSPITAL_COMMUNITY): Payer: Medicare HMO | Attending: Interventional Cardiology

## 2018-08-06 DIAGNOSIS — I340 Nonrheumatic mitral (valve) insufficiency: Secondary | ICD-10-CM

## 2018-08-06 NOTE — Progress Notes (Signed)
EPIC Encounter for ICM Monitoring  Patient Name: RUBY DILONE is a 85 y.o. male Date: 08/06/2018 Primary Care Physican: Lawerance Cruel, MD Primary Cardiologist:Varanasi  Electrophysiologist:Taylor Last Weight:does not weigh   Attempted call to patient and unable to reach.  Transmission reviewed.   CorVuethoracic impedance normalbut was abnormal suggesting fluid accumulation from 5/21 - 5/26.  Prescribed:Torsemide 20 mg 1 tablet daily. Take 1 extra Torsemide tablet for weight gain of 3 lbs.   Labs: 12/05/2017 Creatinine 1.25, BUN 14, Potassium 4.1, Sodium 137, eGFR 51-59 08/14/2017 Creatinine 1.24, BUN 21, Potassium 4.0, Sodium 139, EGFR 50-58 08/06/2017 Creatinine 1.21, BUN 17, Potassium 4.4, Sodium 141, EGFR 53-61  Recommendations:Unable to reach.    Follow-up plan: ICM clinic phone appointment on7/13/2020.  Copy of ICM check sent to Dr.Taylor   3 month ICM trend: 08/04/2018    1 Year ICM trend:       Rosalene Billings, RN 08/06/2018 2:17 PM

## 2018-08-06 NOTE — Progress Notes (Signed)
Patient returned call and is feeling fine.  Denies any fluid symptoms.  Transmission reviewed.  Encouraged to call if he experiences any fluid symptoms. No changes today.

## 2018-08-06 NOTE — Telephone Encounter (Signed)
Remote ICM transmission received.  Attempted call to patient regarding ICM remote transmission and no answer.  

## 2018-08-11 ENCOUNTER — Telehealth: Payer: Self-pay | Admitting: Internal Medicine

## 2018-08-11 ENCOUNTER — Encounter (INDEPENDENT_AMBULATORY_CARE_PROVIDER_SITE_OTHER): Payer: Medicare HMO | Admitting: Ophthalmology

## 2018-08-11 NOTE — Telephone Encounter (Signed)
Attempted to call patient with echo results but there was no answer and VM did not pick up. Will try again at another time.

## 2018-08-11 NOTE — Telephone Encounter (Signed)
Call to patient.  He was trying to figure out who called him on Friday to give him echo results. Advised Drue Novel, Dr Tanna Furry nurse tried to call him.  Advised they were not in the office today but will be tomorrow and will send her a message to call him with results.  He appreciated the call back.  Routed phone note to [place].

## 2018-08-11 NOTE — Telephone Encounter (Signed)
I will route to Sharman Cheek as it looks as she tried to reach the pt last week.

## 2018-08-11 NOTE — Telephone Encounter (Signed)
New Message     Pt says he would like someone to give him a call back today  He said [place] was calling him    Please call back

## 2018-08-11 NOTE — Telephone Encounter (Signed)
Patient is returning your call.  

## 2018-08-15 NOTE — Telephone Encounter (Signed)
Patient was made aware of echo results

## 2018-08-19 ENCOUNTER — Encounter (HOSPITAL_COMMUNITY): Payer: Self-pay | Admitting: Emergency Medicine

## 2018-08-19 ENCOUNTER — Emergency Department (HOSPITAL_COMMUNITY): Payer: Medicare HMO

## 2018-08-19 ENCOUNTER — Emergency Department (HOSPITAL_COMMUNITY)
Admission: EM | Admit: 2018-08-19 | Discharge: 2018-08-19 | Disposition: A | Discharge status: Home / Self Care | Payer: Medicare HMO | Attending: Emergency Medicine | Admitting: Emergency Medicine

## 2018-08-19 DIAGNOSIS — N183 Chronic kidney disease, stage 3 (moderate): Secondary | ICD-10-CM | POA: Diagnosis not present

## 2018-08-19 DIAGNOSIS — Z87891 Personal history of nicotine dependence: Secondary | ICD-10-CM | POA: Insufficient documentation

## 2018-08-19 DIAGNOSIS — M5442 Lumbago with sciatica, left side: Secondary | ICD-10-CM | POA: Insufficient documentation

## 2018-08-19 DIAGNOSIS — Z96643 Presence of artificial hip joint, bilateral: Secondary | ICD-10-CM | POA: Diagnosis not present

## 2018-08-19 DIAGNOSIS — Z79899 Other long term (current) drug therapy: Secondary | ICD-10-CM | POA: Diagnosis not present

## 2018-08-19 DIAGNOSIS — Z955 Presence of coronary angioplasty implant and graft: Secondary | ICD-10-CM | POA: Insufficient documentation

## 2018-08-19 DIAGNOSIS — Z7902 Long term (current) use of antithrombotics/antiplatelets: Secondary | ICD-10-CM | POA: Diagnosis not present

## 2018-08-19 DIAGNOSIS — M47816 Spondylosis without myelopathy or radiculopathy, lumbar region: Secondary | ICD-10-CM | POA: Diagnosis not present

## 2018-08-19 DIAGNOSIS — Z96652 Presence of left artificial knee joint: Secondary | ICD-10-CM | POA: Diagnosis not present

## 2018-08-19 DIAGNOSIS — Z8546 Personal history of malignant neoplasm of prostate: Secondary | ICD-10-CM | POA: Insufficient documentation

## 2018-08-19 DIAGNOSIS — I2510 Atherosclerotic heart disease of native coronary artery without angina pectoris: Secondary | ICD-10-CM | POA: Insufficient documentation

## 2018-08-19 DIAGNOSIS — M545 Low back pain: Secondary | ICD-10-CM | POA: Diagnosis present

## 2018-08-19 DIAGNOSIS — R52 Pain, unspecified: Secondary | ICD-10-CM | POA: Diagnosis not present

## 2018-08-19 DIAGNOSIS — I130 Hypertensive heart and chronic kidney disease with heart failure and stage 1 through stage 4 chronic kidney disease, or unspecified chronic kidney disease: Secondary | ICD-10-CM | POA: Diagnosis not present

## 2018-08-19 DIAGNOSIS — Z96653 Presence of artificial knee joint, bilateral: Secondary | ICD-10-CM | POA: Diagnosis not present

## 2018-08-19 DIAGNOSIS — Z95810 Presence of automatic (implantable) cardiac defibrillator: Secondary | ICD-10-CM | POA: Diagnosis not present

## 2018-08-19 DIAGNOSIS — M25562 Pain in left knee: Secondary | ICD-10-CM | POA: Insufficient documentation

## 2018-08-19 DIAGNOSIS — I5022 Chronic systolic (congestive) heart failure: Secondary | ICD-10-CM | POA: Insufficient documentation

## 2018-08-19 LAB — CBC WITH DIFFERENTIAL/PLATELET
Abs Immature Granulocytes: 0.04 10*3/uL (ref 0.00–0.07)
Basophils Absolute: 0 10*3/uL (ref 0.0–0.1)
Basophils Relative: 0 %
Eosinophils Absolute: 0 10*3/uL (ref 0.0–0.5)
Eosinophils Relative: 0 %
HCT: 43.2 % (ref 39.0–52.0)
Hemoglobin: 14.7 g/dL (ref 13.0–17.0)
Immature Granulocytes: 1 %
Lymphocytes Relative: 23 %
Lymphs Abs: 1.8 10*3/uL (ref 0.7–4.0)
MCH: 31.1 pg (ref 26.0–34.0)
MCHC: 34 g/dL (ref 30.0–36.0)
MCV: 91.5 fL (ref 80.0–100.0)
Monocytes Absolute: 0.8 10*3/uL (ref 0.1–1.0)
Monocytes Relative: 10 %
Neutro Abs: 5.4 10*3/uL (ref 1.7–7.7)
Neutrophils Relative %: 66 %
Platelets: 157 10*3/uL (ref 150–400)
RBC: 4.72 MIL/uL (ref 4.22–5.81)
RDW: 14.2 % (ref 11.5–15.5)
WBC: 8.1 10*3/uL (ref 4.0–10.5)
nRBC: 0 % (ref 0.0–0.2)

## 2018-08-19 LAB — BASIC METABOLIC PANEL
Anion gap: 11 (ref 5–15)
BUN: 20 mg/dL (ref 8–23)
CO2: 25 mmol/L (ref 22–32)
Calcium: 9.2 mg/dL (ref 8.9–10.3)
Chloride: 100 mmol/L (ref 98–111)
Creatinine, Ser: 1.32 mg/dL — ABNORMAL HIGH (ref 0.61–1.24)
GFR calc Af Amer: 55 mL/min — ABNORMAL LOW (ref >60)
GFR calc non Af Amer: 47 mL/min — ABNORMAL LOW (ref >60)
Glucose, Bld: 104 mg/dL — ABNORMAL HIGH (ref 70–99)
Potassium: 3.8 mmol/L (ref 3.5–5.1)
Sodium: 136 mmol/L (ref 135–145)

## 2018-08-19 MED ORDER — KETOROLAC TROMETHAMINE 60 MG/2ML IM SOLN
15.0000 mg | Freq: Once | INTRAMUSCULAR | Status: AC
  Administered 2018-08-19: 15 mg via INTRAMUSCULAR
  Filled 2018-08-19: qty 2

## 2018-08-19 MED ORDER — OXYCODONE HCL 5 MG PO TABS
5.0000 mg | ORAL_TABLET | Freq: Once | ORAL | Status: AC
  Administered 2018-08-19: 5 mg via ORAL
  Filled 2018-08-19: qty 1

## 2018-08-19 MED ORDER — MORPHINE SULFATE 15 MG PO TABS: 7.5000 mg | ORAL_TABLET | ORAL | 0 refills | Status: DC | PRN

## 2018-08-19 MED ORDER — ACETAMINOPHEN 500 MG PO TABS
1000.0000 mg | ORAL_TABLET | Freq: Once | ORAL | Status: AC
  Administered 2018-08-19: 12:00:00 1000 mg via ORAL
  Filled 2018-08-19: qty 2

## 2018-08-19 NOTE — ED Notes (Signed)
Updated patients daughter by phone, plan of care to discharge home with home health consult and pain medications. She is on the way to the hospital to pick him up now.

## 2018-08-19 NOTE — Progress Notes (Signed)
CSW received consult for patient for potential placement versus home health. CSW spoke with RN Lovena Le to request PT/OT orders be placed for evaluation.  CSW will continue following to assist with discharge planning.  Madilyn Fireman, MSW, LCSW-A Clinical Social Worker [facility] [phone]

## 2018-08-19 NOTE — Discharge Instructions (Signed)
Your back pain is most likely due to a muscular strain.  There is been a lot of research on back pain, unfortunately the only thing that seems to really help is Tylenol and ibuprofen.  Relative rest is also important to not lift greater than 10 pounds bending or twisting at the waist.  Please follow-up with your family physician.  The other thing that really seems to benefit patients is physical therapy which your doctor may send you for.  Please return to the emergency department for new numbness or weakness to your arms or legs. Difficulty with urinating or urinating or pooping on yourself.  Also if you cannot feel toilet paper when you wipe or get a fever.   Use your get as prescribed. Also take tylenol 1000mg (2 extra strength) four times a day.     Then take the pain medicine if you feel like you need it. Narcotics do not help with the pain, they only make you care about it less.  You can become addicted to this, people may break into your house to steal it.  It will constipate you.  If you drive under the influence of this medicine you can get a DUI.

## 2018-08-19 NOTE — ED Notes (Signed)
Patient verbalizes understanding of discharge instructions. Opportunity for questioning and answers were provided. Armband removed by staff, pt discharged from ED.  

## 2018-08-19 NOTE — ED Notes (Signed)
Patient transported to X-ray 

## 2018-08-19 NOTE — ED Provider Notes (Signed)
[facility]ERG[facility]Y DEPARTMENT Provider Note   CSN: [id number] Arrival date & time: 08/19/18  3419    History   Chief Complaint Chief Complaint  Patient presents with  . Knee Pain    HPI [name] is a 85 y.o. male.     85 yo M with a cc of left knee pain.  Going on for the past couple weeks or longer.  Patient thinks its related to his spine. Had a left knee replacement in the remote past.  No fever, no loss of bowel or bladder, no loss of perirectal sensation.  Pain worse in the morning and then improves as he ambulates.  Getting worse in the past couple days, to the point he is having trouble getting around.  No fevers, no redness.  Having some chronic right sided back pain.  Patient had a spinal injection about 3 months ago.  No trauma.   The history is provided by the patient.  Knee Pain Location:  Leg Time since incident:  2 weeks Injury: no   Leg location:  L leg Pain details:    Quality:  Aching and sharp   Radiates to:  Does not radiate   Severity:  Moderate   Onset quality:  Gradual   Duration:  2 weeks   Timing:  Constant   Progression:  Worsening Chronicity:  New Prior injury to area:  No Relieved by:  Nothing Worsened by:  Nothing Ineffective treatments:  None tried Associated symptoms: back pain   Associated symptoms: no fever     Past Medical History:  Diagnosis Date  . Anxiety   . Arthritis   . Atrial fibrillation, permanent 01/07/2016  . Blood transfusion    "related to a surgery"  . BPH (benign prostatic hypertrophy) with urinary obstruction    s/p turp yrs ago  . Bradycardia    a. Amio d/c'd 08/2013; brady arrest 08/2013 after PCI >>> recurrent AF >>> Amiodarone restarted. b. Pacemaker being considered in 11/2014.  [name] CAD (coronary artery disease)    a. s/p MI and prior PCI of LAD;  b. LHC (08/2013):  prox LAD 60-70%, mid LAD stents ok, ostial lesion at Dx jailed by stent, mild CFX and RCA disesase >>>  PCI (09/08/13):   rotational atherectomy + Promus DES to prox LAD  . Cardiomyopathy ([place])    a. Echo (08/2013):  EF 30-35%, AS hypokinesis, Gr 1 diast dysfn, mild MR, mild LAE >>> b. improved EF 50-55% by echo 8/15. c. EF down again by echo 12/2014 to 30-35% but 51% by nuc.  [name] Chronic lower back pain   . Chronic systolic CHF (congestive heart failure) ([place])   . CKD (chronic kidney disease), stage III ([place])    a. Per review of labs baseline Cr 1.1-1.3.  [name] DDD (degenerative disc disease)    chronic back pain  . Depression   . Diverticulitis of colon with bleeding    s/p sigmoid resection '88  . DJD (degenerative joint disease) hips and knees   s/p bilateral total replacements  . GERD (gastroesophageal reflux disease)    occ. take prevacid  . History of GI diverticular bleed april 2012   transfused blood and resolved without surgical intervention  . Hypertension   . Impaired hearing bilateral    only left hearing aid  . Impotence, organic    s/p penile prosthesis 1990's  . Incomplete bladder emptying   . LBBB (left bundle branch block)   . Meningioma ([place])  right -sided w/ right VI palsy   followed by dr Gaynell Face  . Mitral regurgitation    a. Mild-mod by echo 12/2014.  [name] Myocardial infarction [facility]) 1980's- medical intervention   "so mild I didn't know I'd had it"  . PAF (paroxysmal atrial fibrillation) (H[facility])    not on coumadin due to hx of GI bleed.  . Prostate cancer ([place]) 11/30/13   Gleason 8, volume 22.14 cc  . Sleep apnea    non-compliant cpap    Patient Active Problem List   Diagnosis Date Noted  . Atrial fibrillation, permanent 01/07/2016  . Orthostatic hypotension 02/06/2015  . Weakness 01/05/2015  . History of GI bleed 01/05/2015  . Hypokalemia 01/05/2015  . Dyspnea 11/30/2014  . Fatigue 02/22/2014  . Coronary artery disease involving native coronary artery of native heart without angina pectoris   . Prostate cancer ([place])   . GI bleed 02/09/2014  . Chest pain 02/09/2014  .  Malignant neoplasm of prostate ([place]) 12/15/2013  . Other malaise and fatigue 11/16/2013  . Chronic systolic heart failure ([place]) 10/12/2013  . Left bundle branch block 10/12/2013  . Angina, class III ([place]) 09/05/2013  . R wrist hematoma following coronary angiography   . Bradycardia   . Coronary atherosclerosis of native coronary artery 09/03/2013  . Mitral valve disorder 07/10/2013  . Cough 07/10/2013  . Nodular prostate without urinary obstruction 01/08/2011  . Hypertrophy of prostate without urinary obstruction and other lower urinary tract symptoms (LUTS) 01/08/2011  . Impotence of organic origin 01/08/2011    Past Surgical History:  Procedure Laterality Date  . APPENDECTOMY    . CARDIAC CATHETERIZATION  2007   noncritical cad (results w/ chart)  . CARDIOVERSION N/A 10/13/2015   Procedure: CARDIOVERSION;  Surgeon: Josue Hector, MD;  Location: [facility];  Service: Cardiovascular;  Laterality: N/A;  . CATARACT EXTRACTION W/ INTRAOCULAR LENS  IMPLANT, BILATERAL Bilateral ~ 2000  . CHOLECYSTECTOMY    . CLOSED REDUCTION HIP DISLOCATION Right "several"  . CORONARY ANGIOPLASTY WITH STENT PLACEMENT  08-03-08   drug-eluting stent x2 distal and mid lad  . EP IMPLANTABLE DEVICE N/A 01/31/2015   Procedure: BiV Pacemaker Insertion CRT-P;  Surgeon: Evans Lance, MD;  Location: [facility] CV LAB;  Service: Cardiovascular;  Laterality: N/A;  . EP IMPLANTABLE DEVICE N/A 02/07/2015   Procedure: Lead Revision/Repair;  Surgeon: Deboraha Sprang, MD;  Location: [facility] CV LAB;  Service: Cardiovascular;  Laterality: N/A;  . ESOPHAGOGASTRODUODENOSCOPY N/A 02/11/2014   Procedure: ESOPHAGOGASTRODUODENOSCOPY (EGD);  Surgeon: Cleotis Nipper, MD;  Location: [facility] ENDOSCOPY;  Service: Endoscopy;  Laterality: N/A;  . gamma knife radiation  2000   [facility] for meningioma, last eval 2013- no change  . INGUINAL HERNIA REPAIR Bilateral   . INNER EAR SURGERY Right yrs ago   "trying to get my hearing back   . LEFT HEART CATHETERIZATION WITH CORONARY ANGIOGRAM N/A 09/04/2013   Procedure: LEFT HEART CATHETERIZATION WITH CORONARY ANGIOGRAM;  Surgeon: Jettie Booze, MD;  Location: [facility] CATH LAB;  Service: Cardiovascular;  Laterality: N/A;  . PENILE PROSTHESIS IMPLANT  1990's  . PERCUTANEOUS CORONARY ROTOBLATOR INTERVENTION (PCI-R) N/A 09/08/2013   Procedure: PERCUTANEOUS CORONARY ROTOBLATOR INTERVENTION (PCI-R);  Surgeon: Jettie Booze, MD;  Location: [facility] CATH LAB;  Service: Cardiovascular;  Laterality: N/A;  . PROSTATE BIOPSY  11/30/13   Gleason 8, vol 22.14 cc  . REVISION TOTAL KNEE ARTHROPLASTY Right   . SHOULDER OPEN ROTATOR CUFF REPAIR Left   . TOTAL HIP ARTHROPLASTY  Right 03-25-08--  . TOTAL HIP ARTHROPLASTY Left 2005  . TOTAL HIP REVISION Right 3-4 times  . TOTAL KNEE ARTHROPLASTY Right 2004  . TOTAL KNEE ARTHROPLASTY Left 1997  . TRANSURETHRAL RESECTION OF PROSTATE  "years ago"  . TRANSURETHRAL RESECTION OF PROSTATE  01/08/2011   Procedure: TRANSURETHRAL RESECTION OF THE PROSTATE (TURP);  Surgeon: Franchot Gallo;  Location: [facility];  Service: Urology;  Laterality: N/A;  GYRUS         Home Medications    Prior to Admission medications   Medication Sig Start Date End Date Taking? Authorizing Provider  acetaminophen (TYLENOL) 500 MG tablet Take 1,000 mg by mouth every 6 (six) hours as needed for headache (pain).    Yes [provider]  brimonidine (ALPHAGAN) 0.15 % ophthalmic solution Place 1 drop into both eyes 2 (two) times daily. 08/11/15  Yes [provider]  clopidogrel (PLAVIX) 75 MG tablet TAKE 1 TABLET EVERY DAY 11/02/16  Yes Jettie Booze, MD  diazepam (VALIUM) 5 MG tablet Take 1 tablet (5 mg total) by mouth every 12 (twelve) hours as needed for anxiety. 01/07/16  Yes Isaiah Serge, NP  dorzolamide-timolol (COSOPT) 22.3-6.8 MG/ML ophthalmic solution Place 1 drop into both eyes 2 (two) times daily.  11/12/13  Yes [provider]  fluticasone (FLONASE) 50 MCG/ACT nasal spray Place 2 sprays into both nostrils daily as needed for allergies.  12/08/13  Yes [provider]  furosemide (LASIX) 20 MG tablet Take 20 mg by mouth.   Yes [provider]  methylPREDNISolone (MEDROL DOSEPAK) 4 MG TBPK tablet Take 4 mg by mouth as directed. Started on 08-16-18 DS 6 08/16/18  Yes [provider]  Multiple Vitamins-Minerals (EMERGEN-C VITAMIN C) PACK Take 1,000 mg by mouth daily after lunch.   Yes [provider]  Multiple Vitamins-Minerals (PRESERVISION AREDS 2) CAPS Take 1 capsule by mouth daily after lunch.   Yes [provider]  nitroGLYCERIN (NITROSTAT) 0.4 MG SL tablet Place 1 tablet (0.4 mg total) under the tongue every 5 (five) minutes as needed for chest pain. 04/21/18  Yes Evans Lance, MD  Omega-3 Fatty Acids (FISH OIL PO) Take 1 capsule by mouth daily after lunch.   Yes [provider]  potassium chloride (K-DUR,KLOR-CON) 10 MEQ tablet TAKE 1 TABLET EVERY DAY AS NEEDED. WHEN YOU TAKE YOUR FUROSEMIDE. Patient taking differently: Take 10 mEq by mouth daily as needed (taking with furosemide).  11/02/16  Yes Jettie Booze, MD  Probiotic Product (PROBIOTIC PO) Take 1 tablet by mouth 2 (two) times daily.    Yes [provider]  psyllium (METAMUCIL) 58.6 % packet Take 1 packet by mouth daily with supper. Mix in 8 oz liquid and drink   Yes [provider]  tamsulosin (FLOMAX) 0.4 MG CAPS capsule Take 0.4 mg by mouth daily. 05/17/18  Yes [provider]  torsemide (DEMADEX) 20 MG tablet Take one extra tablet of torsemide by mouth if weight gain of 3 pounds 08/28/17  Yes Evans Lance, MD  traMADol (ULTRAM) 50 MG tablet Take 50 mg as needed by mouth. 03/15/15  Yes [provider]  finasteride (PROSCAR) 5 MG tablet Take 1 tablet (5 mg total) by mouth daily. Patient not taking: Reported on 08/19/2018 01/08/16   Isaiah Serge, NP   morphine (MSIR) 15 MG tablet Take 0.5 tablets (7.5 mg total) by mouth every 4 (four) hours as needed for severe pain. 08/19/18   Deno Etienne, DO  Family History Family History  Problem Relation Age of Onset  . Heart disease Mother   . Heart attack Mother   . Emphysema Father   . Cancer Brother        liver cancer  . Cancer Other     Social History Social History   Tobacco Use  . Smoking status: Former Smoker    Packs/day: 1.00    Years: 14.00    Pack years: 14.00    Types: Cigarettes    Quit date: 01/05/1959    Years since quitting: 59.6  . Smokeless tobacco: Never Used  Substance Use Topics  . Alcohol use: Yes    Comment: 02/09/2014 "I'm not drinking anymore"  . Drug use: No     Allergies   Codeine, Omeprazole, Pantoprazole, Pantoprazole sodium, and Warfarin sodium   Review of Systems Review of Systems  Constitutional: Negative for chills and fever.  HENT: Negative for congestion and facial swelling.   Eyes: Negative for discharge and visual disturbance.  Respiratory: Negative for shortness of breath.   Cardiovascular: Negative for chest pain and palpitations.  Gastrointestinal: Negative for abdominal pain, diarrhea and vomiting.  Musculoskeletal: Positive for arthralgias and back pain. Negative for myalgias.  Skin: Negative for color change and rash.  Neurological: Negative for tremors, syncope and headaches.  Psychiatric/Behavioral: Negative for confusion and dysphoric mood.     Physical Exam Updated Vital Signs BP (!) 146/60 (BP Location: Right Arm)   Pulse 60   Temp 98 F (36.7 C) (Oral)   Resp 16   SpO2 100%   Physical Exam Vitals signs and nursing note reviewed.  Constitutional:      Appearance: He is well-developed.  HENT:     Head: Normocephalic and atraumatic.  Eyes:     Pupils: Pupils are equal, round, and reactive to light.  Neck:     Musculoskeletal: Normal range of motion and neck supple.     Vascular: No JVD.  Cardiovascular:      Rate and Rhythm: Normal rate and regular rhythm.     Heart sounds: No murmur. No friction rub. No gallop.   Pulmonary:     Effort: No respiratory distress.     Breath sounds: No wheezing.  Abdominal:     General: There is no distension.     Tenderness: There is no guarding or rebound.  Musculoskeletal: Normal range of motion.     Comments: Trace effusion, no erythema, full ROM of the left knee without significant pain.  No pain with internal and external rotation of the hip.  No midline spinal tenderness, mild tenderness to the R SI joint area.   PMS intact distally  Skin:    Coloration: Skin is not pale.     Findings: No rash.  Neurological:     Mental Status: He is alert and oriented to person, place, and time.  Psychiatric:        Behavior: Behavior normal.      ED Treatments / Results  Labs (all labs ordered are listed, but only abnormal results are displayed) Labs Reviewed  BASIC METABOLIC PANEL - Abnormal; Notable for the following components:      Result Value   Glucose, Bld 104 (*)    Creatinine, Ser 1.32 (*)    GFR calc non Af Amer 47 (*)    GFR calc Af Amer 55 (*)    All other components within normal limits  NOVEL CORONAVIRUS, NAA (HOSPITAL ORDER, SEND-OUT TO REF LAB)  CBC WITH  DIFFERENTIAL/PLATELET    EKG None  Radiology Dg Lumbar Spine Complete  Result Date: 08/19/2018 CLINICAL DATA:  Atrial fibrillation. EXAM: LUMBAR SPINE - COMPLETE 4+ VIEW COMPARISON:  03/31/2018.  12/25/2017. FINDINGS: Severe lumbar scoliosis concave left again noted. Severe multilevel degenerative change again noted. Similar findings noted on prior exam. No acute abnormality identified. Aortoiliac and visceral atherosclerotic vascular calcification. Surgical clips right upper quadrant. Cardiac pacer present. IMPRESSION: 1. Severe lumbar scoliosis concave left and severe multilevel degenerative change again noted. No acute bony abnormality. Similar findings noted on prior exam. Bilateral  hip replacements. 2.  Aortoiliac and visceral atherosclerotic vascular disease. Electronically Signed   By: Marcello Moores  Register   On: 08/19/2018 13:18   Dg Knee Complete 4 Views Left  Result Date: 08/19/2018 CLINICAL DATA:  Lateral knee pain. No injury. Prior left knee replacement. EXAM: LEFT KNEE - COMPLETE 4+ VIEW COMPARISON:  No recent. FINDINGS: Total left knee replacement. Hardware intact. Anatomic alignment. No evidence of fracture. No evidence of dislocation. IMPRESSION: 1. Total left knee replacement. Hardware intact. Anatomic alignment. No acute bony abnormality. 2.  Peripheral vascular disease. Electronically Signed   By: Marcello Moores  Register   On: 08/19/2018 10:34    Procedures Procedures (including critical care time)  Medications Ordered in ED Medications  acetaminophen (TYLENOL) tablet 1,000 mg (1,000 mg Oral Given 08/19/18 1205)  oxyCODONE (Oxy IR/ROXICODONE) immediate release tablet 5 mg (5 mg Oral Given 08/19/18 1205)  ketorolac (TORADOL) injection 15 mg (15 mg Intramuscular Given 08/19/18 1205)     Initial Impression / Assessment and Plan / ED Course  I have reviewed the triage vital signs and the nursing notes.  Pertinent labs & imaging results that were available during my care of the patient were reviewed by me and considered in my medical decision making (see chart for details).        85 yo M with a cc of left knee pain.  Going on for the past few weeks.  Started as pain worse in the first few steps now having pain constantly.   I discussed the case with Dr. Maryjean Ka the patient's pain management physician he recommended outpatient follow-up.  I discussed this with the patient who felt that he is unsafe to go home.  He would like to stay in the hospital until his pain is better.  I discussed with him that this may not be an option if we do not find a medical reason for him to come into the hospital.  I discussed with him that social work will come and evaluate him for home  health versus rehab placement.  Patient's lab work is unremarkable.  No significant anemia no significant change in his renal function.  Plain film of the low back as well as the left knee without a significant new issue.  The patient is refusing to attempt to ambulate with me and feels that he would be best served in the hospital.  I will discuss with the hospitalist for the possibility of pain control and physical therapy in house.  I discussed with the hospitalist who felt that the patient did not meet inpatient criteria for hospitalization.  I discussed this again with the patient he was willing to try and go home.  We will put in home health orders.  PCP and pain management follow-up.  2:16 PM:  I have discussed the diagnosis/risks/treatment options with the patient and believe the pt to be eligible for discharge home to follow-up with Pain management, PCP. We  also discussed returning to the ED immediately if new or worsening sx occur. We discussed the sx which are most concerning (e.g., sudden worsening pain, fever, inability to tolerate by mouth, cauda equina s/sx) that necessitate immediate return. Medications administered to the patient during their visit and any new prescriptions provided to the patient are listed below.  Medications given during this visit Medications  acetaminophen (TYLENOL) tablet 1,000 mg (1,000 mg Oral Given 08/19/18 1205)  oxyCODONE (Oxy IR/ROXICODONE) immediate release tablet 5 mg (5 mg Oral Given 08/19/18 1205)  ketorolac (TORADOL) injection 15 mg (15 mg Intramuscular Given 08/19/18 1205)     The patient appears reasonably screen and/or stabilized for discharge and I doubt any other medical condition or other [facility] requiring further screening, evaluation, or treatment in the ED at this time prior to discharge.    Final Clinical Impressions(s) / ED Diagnoses   Final diagnoses:  Acute left-sided low back pain with left-sided sciatica    ED Discharge Orders          Ordered    morphine (MSIR) 15 MG tablet  Every 4 hours PRN     08/19/18 [place]     08/19/18 1415    Face-to-face encounter (required for Medicare/Medicaid patients)    Comments: I Cecilio Asper certify that this patient is under my care and that I, or a nurse practitioner or physician's assistant working with me, had a face-to-face encounter that meets the physician face-to-face encounter requirements with this patient on 08/19/2018. The encounter with the patient was in whole, or in part for the following medical condition(s) which is the primary reason for home health care (List medical condition): Spinal stenosis worsening over the past month making it difficult for the patient to stand up for walk more than 2 steps at a time   08/19/18 [place], DO 08/19/18 1416

## 2018-08-19 NOTE — Progress Notes (Signed)
Pt declined to let PT see him, stating he both did not want to make the L knee hurt more and was not going to consider going anywhere but home.  Has no family or assistance at the present there, living alone.  He is asking to stay at the hosp for a couple days.   08/19/18 1300  PT Visit Information  Last PT Received On 08/19/18  Reason Eval/Treat Not Completed Other (comment);Pain limiting ability to participate   Mee Hives, PT MS Acute Rehab Dept. Number: [place]

## 2018-08-19 NOTE — ED Triage Notes (Signed)
Pt arrives by [facility] for a painful swollen knee for the past 2-3 weeks that was significantly worse today with weight bearing. Pt has this knee replaced 15 years ago and denies any new or recent injury. Pt states his doctor currently has him taking a steroid but is not helping.

## 2018-08-25 DIAGNOSIS — M549 Dorsalgia, unspecified: Secondary | ICD-10-CM | POA: Diagnosis not present

## 2018-08-26 DIAGNOSIS — M5416 Radiculopathy, lumbar region: Secondary | ICD-10-CM | POA: Diagnosis not present

## 2018-08-28 DIAGNOSIS — Z9181 History of falling: Secondary | ICD-10-CM | POA: Diagnosis not present

## 2018-08-28 DIAGNOSIS — M5416 Radiculopathy, lumbar region: Secondary | ICD-10-CM | POA: Diagnosis not present

## 2018-08-28 DIAGNOSIS — Z7902 Long term (current) use of antithrombotics/antiplatelets: Secondary | ICD-10-CM | POA: Diagnosis not present

## 2018-08-28 DIAGNOSIS — Z7952 Long term (current) use of systemic steroids: Secondary | ICD-10-CM | POA: Diagnosis not present

## 2018-09-01 DIAGNOSIS — M5416 Radiculopathy, lumbar region: Secondary | ICD-10-CM | POA: Diagnosis not present

## 2018-09-03 DIAGNOSIS — M5416 Radiculopathy, lumbar region: Secondary | ICD-10-CM | POA: Diagnosis not present

## 2018-09-03 DIAGNOSIS — Z7902 Long term (current) use of antithrombotics/antiplatelets: Secondary | ICD-10-CM | POA: Diagnosis not present

## 2018-09-03 DIAGNOSIS — Z7952 Long term (current) use of systemic steroids: Secondary | ICD-10-CM | POA: Diagnosis not present

## 2018-09-03 DIAGNOSIS — Z9181 History of falling: Secondary | ICD-10-CM | POA: Diagnosis not present

## 2018-09-05 DIAGNOSIS — Z9181 History of falling: Secondary | ICD-10-CM | POA: Diagnosis not present

## 2018-09-05 DIAGNOSIS — M5416 Radiculopathy, lumbar region: Secondary | ICD-10-CM | POA: Diagnosis not present

## 2018-09-05 DIAGNOSIS — Z7902 Long term (current) use of antithrombotics/antiplatelets: Secondary | ICD-10-CM | POA: Diagnosis not present

## 2018-09-05 DIAGNOSIS — Z7952 Long term (current) use of systemic steroids: Secondary | ICD-10-CM | POA: Diagnosis not present

## 2018-09-08 ENCOUNTER — Telehealth: Payer: Self-pay

## 2018-09-08 NOTE — Telephone Encounter (Signed)
Left message for patient to remind of missed remote transmission.  

## 2018-09-09 ENCOUNTER — Other Ambulatory Visit: Payer: Self-pay

## 2018-09-09 ENCOUNTER — Emergency Department (HOSPITAL_COMMUNITY)
Admission: EM | Admit: 2018-09-09 | Discharge: 2018-09-09 | Disposition: A | Discharge status: Home / Self Care | Payer: Medicare HMO | Attending: Emergency Medicine | Admitting: Emergency Medicine

## 2018-09-09 ENCOUNTER — Encounter (HOSPITAL_COMMUNITY): Payer: Self-pay | Admitting: *Deleted

## 2018-09-09 ENCOUNTER — Emergency Department (HOSPITAL_COMMUNITY): Payer: Medicare HMO

## 2018-09-09 DIAGNOSIS — R000 Tachycardia, unspecified: Secondary | ICD-10-CM | POA: Diagnosis not present

## 2018-09-09 DIAGNOSIS — N183 Chronic kidney disease, stage 3 (moderate): Secondary | ICD-10-CM | POA: Diagnosis not present

## 2018-09-09 DIAGNOSIS — Z87891 Personal history of nicotine dependence: Secondary | ICD-10-CM | POA: Insufficient documentation

## 2018-09-09 DIAGNOSIS — I5022 Chronic systolic (congestive) heart failure: Secondary | ICD-10-CM | POA: Diagnosis not present

## 2018-09-09 DIAGNOSIS — R200 Anesthesia of skin: Secondary | ICD-10-CM

## 2018-09-09 DIAGNOSIS — Z79899 Other long term (current) drug therapy: Secondary | ICD-10-CM | POA: Insufficient documentation

## 2018-09-09 DIAGNOSIS — I2510 Atherosclerotic heart disease of native coronary artery without angina pectoris: Secondary | ICD-10-CM | POA: Diagnosis not present

## 2018-09-09 DIAGNOSIS — R531 Weakness: Secondary | ICD-10-CM | POA: Diagnosis not present

## 2018-09-09 DIAGNOSIS — Z96652 Presence of left artificial knee joint: Secondary | ICD-10-CM | POA: Diagnosis not present

## 2018-09-09 DIAGNOSIS — I130 Hypertensive heart and chronic kidney disease with heart failure and stage 1 through stage 4 chronic kidney disease, or unspecified chronic kidney disease: Secondary | ICD-10-CM | POA: Insufficient documentation

## 2018-09-09 DIAGNOSIS — Z8546 Personal history of malignant neoplasm of prostate: Secondary | ICD-10-CM | POA: Diagnosis not present

## 2018-09-09 DIAGNOSIS — I10 Essential (primary) hypertension: Secondary | ICD-10-CM | POA: Diagnosis not present

## 2018-09-09 DIAGNOSIS — I959 Hypotension, unspecified: Secondary | ICD-10-CM | POA: Diagnosis not present

## 2018-09-09 DIAGNOSIS — Z96651 Presence of right artificial knee joint: Secondary | ICD-10-CM | POA: Insufficient documentation

## 2018-09-09 DIAGNOSIS — I672 Cerebral atherosclerosis: Secondary | ICD-10-CM | POA: Diagnosis not present

## 2018-09-09 DIAGNOSIS — R0902 Hypoxemia: Secondary | ICD-10-CM | POA: Diagnosis not present

## 2018-09-09 DIAGNOSIS — R202 Paresthesia of skin: Secondary | ICD-10-CM | POA: Diagnosis not present

## 2018-09-09 LAB — DIFFERENTIAL
Abs Immature Granulocytes: 0.16 10*3/uL — ABNORMAL HIGH (ref 0.00–0.07)
Basophils Absolute: 0 10*3/uL (ref 0.0–0.1)
Basophils Relative: 0 %
Eosinophils Absolute: 0.2 10*3/uL (ref 0.0–0.5)
Eosinophils Relative: 2 %
Immature Granulocytes: 2 %
Lymphocytes Relative: 20 %
Lymphs Abs: 1.8 10*3/uL (ref 0.7–4.0)
Monocytes Absolute: 0.8 10*3/uL (ref 0.1–1.0)
Monocytes Relative: 9 %
Neutro Abs: 6.2 10*3/uL (ref 1.7–7.7)
Neutrophils Relative %: 67 %

## 2018-09-09 LAB — PROTIME-INR
INR: 1.1 (ref 0.8–1.2)
Prothrombin Time: 13.8 seconds (ref 11.4–15.2)

## 2018-09-09 LAB — CBC
HCT: 43.7 % (ref 39.0–52.0)
Hemoglobin: 14.5 g/dL (ref 13.0–17.0)
MCH: 31.4 pg (ref 26.0–34.0)
MCHC: 33.2 g/dL (ref 30.0–36.0)
MCV: 94.6 fL (ref 80.0–100.0)
Platelets: 144 10*3/uL — ABNORMAL LOW (ref 150–400)
RBC: 4.62 MIL/uL (ref 4.22–5.81)
RDW: 15.2 % (ref 11.5–15.5)
WBC: 9.2 10*3/uL (ref 4.0–10.5)
nRBC: 0 % (ref 0.0–0.2)

## 2018-09-09 LAB — COMPREHENSIVE METABOLIC PANEL
ALT: 20 U/L (ref 0–44)
AST: 24 U/L (ref 15–41)
Albumin: 3.1 g/dL — ABNORMAL LOW (ref 3.5–5.0)
Alkaline Phosphatase: 79 U/L (ref 38–126)
Anion gap: 11 (ref 5–15)
BUN: 18 mg/dL (ref 8–23)
CO2: 26 mmol/L (ref 22–32)
Calcium: 8.2 mg/dL — ABNORMAL LOW (ref 8.9–10.3)
Chloride: 93 mmol/L — ABNORMAL LOW (ref 98–111)
Creatinine, Ser: 1.52 mg/dL — ABNORMAL HIGH (ref 0.61–1.24)
GFR calc Af Amer: 46 mL/min — ABNORMAL LOW (ref >60)
GFR calc non Af Amer: 40 mL/min — ABNORMAL LOW (ref >60)
Glucose, Bld: 108 mg/dL — ABNORMAL HIGH (ref 70–99)
Potassium: 4.2 mmol/L (ref 3.5–5.1)
Sodium: 130 mmol/L — ABNORMAL LOW (ref 135–145)
Total Bilirubin: 1 mg/dL (ref 0.3–1.2)
Total Protein: 5.3 g/dL — ABNORMAL LOW (ref 6.5–8.1)

## 2018-09-09 LAB — APTT: aPTT: 26 seconds (ref 24–36)

## 2018-09-09 NOTE — ED Provider Notes (Signed)
[name] Provider Note   CSN: [id number] Arrival date & time: 09/09/18  1713    History   Chief Complaint Chief Complaint  Patient presents with   Weakness   Numbness    HPI ASAHEL [name] is a 85 y.o. male.     HPI Patient presents to the ED for evaluation of left hand numbness.  Patient states he woke up this morning and felt that his left hand was numb.  He did not have any symptoms up in his upper arm or elbow.  He denies any numbness anywhere else throughout his body.  He has had some generalized weakness but denies any focal weakness.  He denies any trouble with his speech or his vision.  He denies any other symptoms such as fevers or chills.  He denies any vomiting or diarrhea.  No cough, shortness of breath or pain.  Patient has been dealing with some sciatic issues recently.  He had been on a course of steroids.  He actually feels like that is getting better and is not really bothering him much now. Past Medical History:  Diagnosis Date   Anxiety    Arthritis    Atrial fibrillation, permanent 01/07/2016   Blood transfusion    "related to a surgery"   BPH (benign prostatic hypertrophy) with urinary obstruction    s/p turp yrs ago   Bradycardia    a. Amio d/c'd 08/2013; brady arrest 08/2013 after PCI >>> recurrent AF >>> Amiodarone restarted. b. Pacemaker being considered in 11/2014.   CAD (coronary artery disease)    a. s/p MI and prior PCI of LAD;  b. LHC (08/2013):  prox LAD 60-70%, mid LAD stents ok, ostial lesion at Dx jailed by stent, mild CFX and RCA disesase >>>  PCI (09/08/13):  rotational atherectomy + Promus DES to prox LAD   Cardiomyopathy [facility])    a. Echo (08/2013):  EF 30-35%, AS hypokinesis, Gr 1 diast dysfn, mild MR, mild LAE >>> b. improved EF 50-55% by echo 8/15. c. EF down again by echo 12/2014 to 30-35% but 51% by nuc.   Chronic lower back pain    Chronic systolic CHF (congestive heart failure)  (HCC)    CKD (chronic kidney disease), stage III ([place])    a. Per review of labs baseline Cr 1.1-1.3.   DDD (degenerative disc disease)    chronic back pain   Depression    Diverticulitis of colon with bleeding    s/p sigmoid resection '88   DJD (degenerative joint disease) hips and knees   s/p bilateral total replacements   GERD (gastroesophageal reflux disease)    occ. take prevacid   History of GI diverticular bleed april 2012   transfused blood and resolved without surgical intervention   Hypertension    Impaired hearing bilateral    only left hearing aid   Impotence, organic    s/p penile prosthesis 1990's   Incomplete bladder emptying    LBBB (left bundle branch block)    Meningioma (HCC) right -sided w/ right VI palsy   followed by dr Gaynell Face   Mitral regurgitation    a. Mild-mod by echo 12/2014.   Myocardial infarction ([place]) 1980's- medical intervention   "so mild I didn't know I'd had it"   PAF (paroxysmal atrial fibrillation) ([place])    not on coumadin due to hx of GI bleed.   Prostate cancer ([place]) 11/30/13   Gleason 8, volume 22.14 cc   Sleep apnea  non-compliant cpap    Patient Active Problem List   Diagnosis Date Noted   Atrial fibrillation, permanent 01/07/2016   Orthostatic hypotension 02/06/2015   Weakness 01/05/2015   History of GI bleed 01/05/2015   Hypokalemia 01/05/2015   Dyspnea 11/30/2014   Fatigue 02/22/2014   Coronary artery disease involving native coronary artery of native heart without angina pectoris    Prostate cancer ([place])    GI bleed 02/09/2014   Chest pain 02/09/2014   Malignant neoplasm of prostate ([place]) 12/15/2013   Other malaise and fatigue 62/95/2841   Chronic systolic heart failure ([place]) 10/12/2013   Left bundle branch block 10/12/2013   Angina, class III ([place]) 09/05/2013   R wrist hematoma following coronary angiography    Bradycardia    Coronary atherosclerosis of native coronary artery  09/03/2013   Mitral valve disorder 07/10/2013   Cough 07/10/2013   Nodular prostate without urinary obstruction 01/08/2011   Hypertrophy of prostate without urinary obstruction and other lower urinary tract symptoms (LUTS) 01/08/2011   Impotence of organic origin 01/08/2011    Past Surgical History:  Procedure Laterality Date   APPENDECTOMY     CARDIAC CATHETERIZATION  2007   noncritical cad (results w/ chart)   CARDIOVERSION N/A 10/13/2015   Procedure: CARDIOVERSION;  Surgeon: Josue Hector, MD;  Location: [place];  Service: Cardiovascular;  Laterality: N/A;   CATARACT EXTRACTION W/ INTRAOCULAR LENS  IMPLANT, BILATERAL Bilateral ~ [place] Right "several"   CORONARY ANGIOPLASTY WITH STENT PLACEMENT  08-03-08   drug-eluting stent x2 distal and mid lad   EP IMPLANTABLE DEVICE N/A 01/31/2015   Procedure: BiV Pacemaker Insertion CRT-P;  Surgeon: Evans Lance, MD;  Location: [place] CV LAB;  Service: Cardiovascular;  Laterality: N/A;   EP IMPLANTABLE DEVICE N/A 02/07/2015   Procedure: Lead Revision/Repair;  Surgeon: Deboraha Sprang, MD;  Location: [place] CV LAB;  Service: Cardiovascular;  Laterality: N/A;   ESOPHAGOGASTRODUODENOSCOPY N/A 02/11/2014   Procedure: ESOPHAGOGASTRODUODENOSCOPY (EGD);  Surgeon: Cleotis Nipper, MD;  Location: [facility] ENDOSCOPY;  Service: Endoscopy;  Laterality: N/A;   gamma knife radiation  2000   [facility] for meningioma, last eval 2013- no change   INGUINAL HERNIA REPAIR Bilateral    INNER EAR SURGERY Right yrs ago   "trying to get my hearing back   LEFT HEART CATHETERIZATION WITH CORONARY ANGIOGRAM N/A 09/04/2013   Procedure: LEFT HEART CATHETERIZATION WITH CORONARY ANGIOGRAM;  Surgeon: Jettie Booze, MD;  Location: [facility] CATH LAB;  Service: Cardiovascular;  Laterality: N/A;   PENILE PROSTHESIS IMPLANT  1990's   PERCUTANEOUS CORONARY ROTOBLATOR INTERVENTION (PCI-R) N/A 09/08/2013    Procedure: PERCUTANEOUS CORONARY ROTOBLATOR INTERVENTION (PCI-R);  Surgeon: Jettie Booze, MD;  Location: [facility] C[facility] LAB;  Service: Cardiovascular;  Laterality: N/A;   PROSTATE BIOPSY  11/30/13   Gleason 8, vol 22.14 cc   REVISION TOTAL KNEE ARTHROPLASTY Right    SHOULDER OPEN ROTATOR CUFF REPAIR Left    TOTAL HIP ARTHROPLASTY Right 03-25-08--   TOTAL HIP ARTHROPLASTY Left 2005   TOTAL HIP REVISION Right 3-4 times   TOTAL KNEE ARTHROPLASTY Right 2004   TOTAL KNEE ARTHROPLASTY Left 1997   TRANSURETHRAL RESECTION OF PROSTATE  "years ago"   TRANSURETHRAL RESECTION OF PROSTATE  01/08/2011   Procedure: TRANSURETHRAL RESECTION OF THE PROSTATE (TURP);  Surgeon: Franchot Gallo;  Location: [facility];  Service: Urology;  Laterality: N/A;  GYRUS  Home Medications    Prior to Admission medications   Medication Sig Start Date End Date Taking? Authorizing Provider  acetaminophen (TYLENOL) 500 MG tablet Take 1,000 mg by mouth every 6 (six) hours as needed for headache (pain).     [provider]  brimonidine (ALPHAGAN) 0.15 % ophthalmic solution Place 1 drop into both eyes 2 (two) times daily. 08/11/15   [provider]  clopidogrel (PLAVIX) 75 MG tablet TAKE 1 TABLET EVERY DAY 11/02/16   Jettie Booze, MD  diazepam (VALIUM) 5 MG tablet Take 1 tablet (5 mg total) by mouth every 12 (twelve) hours as needed for anxiety. 01/07/16   Isaiah Serge, NP  dorzolamide-timolol (COSOPT) 22.3-6.8 MG/ML ophthalmic solution Place 1 drop into both eyes 2 (two) times daily.  11/12/13   [provider]  finasteride (PROSCAR) 5 MG tablet Take 1 tablet (5 mg total) by mouth daily. Patient not taking: Reported on 08/19/2018 01/08/16   Isaiah Serge, NP  fluticasone [facility]) 50 MCG/ACT nasal spray Place 2 sprays into both nostrils daily as needed for allergies.  12/08/13   [provider]  furosemide (LASIX) 20 MG tablet Take 20 mg by  mouth.    [provider]  methylPREDNISolone (MEDROL DOSEPAK) 4 MG TBPK tablet Take 4 mg by mouth as directed. Started on 08-16-18 DS 6 08/16/18   [provider]  morphine (MSIR) 15 MG tablet Take 0.5 tablets (7.5 mg total) by mouth every 4 (four) hours as needed for severe pain. 08/19/18   Deno Etienne, DO  Multiple Vitamins-Minerals (EMERGEN-C VITAMIN C) PACK Take 1,000 mg by mouth daily after lunch.    [provider]  Multiple Vitamins-Minerals (PRESERVISION AREDS 2) CAPS Take 1 capsule by mouth daily after lunch.    [provider]  nitroGLYCERIN (NITROSTAT) 0.4 MG SL tablet Place 1 tablet (0.4 mg total) under the tongue every 5 (five) minutes as needed for chest pain. 04/21/18   Evans Lance, MD  Omega-3 Fatty Acids (FISH OIL PO) Take 1 capsule by mouth daily after lunch.    [provider]  potassium chloride (K-DUR,KLOR-CON) 10 MEQ tablet TAKE 1 TABLET EVERY DAY AS NEEDED. WHEN YOU TAKE YOUR FUROSEMIDE. Patient taking differently: Take 10 mEq by mouth daily as needed (taking with furosemide).  11/02/16   Jettie Booze, MD  Probiotic Product (PROBIOTIC PO) Take 1 tablet by mouth 2 (two) times daily.     [provider]  psyllium (METAMUCIL) 58.6 % packet Take 1 packet by mouth daily with supper. Mix in 8 oz liquid and drink    [provider]  tamsulosin (FLOMAX) 0.4 MG CAPS capsule Take 0.4 mg by mouth daily. 05/17/18   [provider]  torsemide (DEMADEX) 20 MG tablet Take one extra tablet of torsemide by mouth if weight gain of 3 pounds 08/28/17   Evans Lance, MD  traMADol (ULTRAM) 50 MG tablet Take 50 mg as needed by mouth. 03/15/15   [provider]    Family History Family History  Problem Relation Age of Onset   Heart disease Mother    Heart attack Mother    Emphysema Father    Cancer Brother        liver cancer   Cancer Other     Social History Social History   Tobacco Use    Smoking status: Former Smoker    Packs/day: 1.00    Years: 14.00    Pack years: 14.00  Types: Cigarettes    Quit date: 01/05/1959    Years since quitting: 59.7   Smokeless tobacco: Never Used  Substance Use Topics   Alcohol use: Yes    Comment: 02/09/2014 "I'm not drinking anymore"   Drug use: No     Allergies   Codeine, Omeprazole, Pantoprazole, Pantoprazole sodium, and Warfarin sodium   Review of Systems Review of Systems  All other systems reviewed and are negative.    Physical Exam Updated Vital Signs BP 127/66 (BP Location: Right Arm)    Pulse 79    Temp 97.9 F (36.6 C) (Oral)    Resp 19    SpO2 93%   Physical Exam Vitals signs and nursing note reviewed.  Constitutional:      General: He is not in acute distress.    Appearance: He is well-developed.  HENT:     Head: Normocephalic and atraumatic.     Right Ear: External ear normal.     Left Ear: External ear normal.  Eyes:     General: No scleral icterus.       Right eye: No discharge.        Left eye: No discharge.     Conjunctiva/sclera: Conjunctivae normal.  Neck:     Musculoskeletal: Neck supple.     Trachea: No tracheal deviation.  Cardiovascular:     Rate and Rhythm: Normal rate and regular rhythm.  Pulmonary:     Effort: Pulmonary effort is normal. No respiratory distress.     Breath sounds: Normal breath sounds. No stridor. No wheezing or rales.  Abdominal:     General: Bowel sounds are normal. There is no distension.     Palpations: Abdomen is soft.     Tenderness: There is no abdominal tenderness. There is no guarding or rebound.  Musculoskeletal:        General: No tenderness.  Skin:    General: Skin is warm and dry.     Findings: No rash.  Neurological:     Mental Status: He is alert and oriented to person, place, and time.     Cranial Nerves: No cranial nerve deficit (No facial droop, extraocular movements intact, tongue midline ).     Sensory: No sensory deficit.     Motor: No  abnormal muscle tone or seizure activity.     Coordination: Coordination normal.     Comments: No pronator drift bilateral upper extrem, able to hold both legs off bed for 5 seconds, sensation intact in all extremities, no visual field cuts, no left or right sided neglect, normal finger-nose exam bilaterally, no nystagmus noted       ED Treatments / Results  Labs (all labs ordered are listed, but only abnormal results are displayed) Labs Reviewed  CBC - Abnormal; Notable for the following components:      Result Value   Platelets 144 (*)    All other components within normal limits  DIFFERENTIAL - Abnormal; Notable for the following components:   Abs Immature Granulocytes 0.16 (*)    All other components within normal limits  COMPREHENSIVE METABOLIC PANEL - Abnormal; Notable for the following components:   Sodium 130 (*)    Chloride 93 (*)    Glucose, Bld 108 (*)    Creatinine, Ser 1.52 (*)    Calcium 8.2 (*)    Total Protein 5.3 (*)    Albumin 3.1 (*)    GFR calc non Af Amer 40 (*)    GFR calc Af Amer 46 (*)  All other components within normal limits  PROTIME-INR  APTT  URINALYSIS, ROUTINE W REFLEX MICROSCOPIC    EKG EKG Interpretation  Date/Time:  Tuesday September 09 2018 17:16:37 EDT Ventricular Rate:  103 PR Interval:    QRS Duration: 173 QT Interval:  406 QTC Calculation: 435 R Axis:   130 Text Interpretation:  paced rhythm Ventricular bigeminy , new since last tracing Right bundle branch block Lateral infarct, old Confirmed by Dorie Rank [phone]) on 09/09/2018 6:08:22 PM   Radiology Ct Head Wo Contrast  Addendum Date: 09/09/2018   ADDENDUM REPORT: 09/09/2018 19:26 ADDENDUM: Addendum to the initial report: Sinus/orbits findings as well as impression should note that a small left mastoid effusion is present. Electronically Signed   By: Lovena Le M.D.   On: 09/09/2018 19:26   Result Date: 09/09/2018 CLINICAL DATA:  Focal neurologic deficit, onset of symptoms  greater than 6 hours prior, stroke suspected. Increasing weakness for 2 weeks, woke with numbness and tingling to right hand. EXAM: CT HEAD WITHOUT CONTRAST TECHNIQUE: Contiguous axial images were obtained from the base of the skull through the vertex without intravenous contrast. COMPARISON:  CT head 06/21/2015 FINDINGS: Brain: No evidence of acute infarction, hemorrhage, hydrocephalus, or extra-axial collection. Patchy areas of white matter hypoattenuation likely reflect changes of chronic microvascular angiopathy. Mild parenchymal atrophy, similar in extent to prior study. Vascular: Atherosclerotic calcification of the carotid siphons, proximal middle cerebral arteries, intradural vertebral arteries and basilar artery. No abnormal hyperdense vessels. Skull: Redemonstration of a destructive lytic lesion centered upon the right lateral aspect of the clivus with associated soft tissue extension in the right cavernous sinus and retro clival space. Appearance and size of this lesion is grossly stable 2017 exam. Sinuses/Orbits: Paranasal sinuses and mastoid air cells are predominantly clear. Middle ear cavities are clear. Redemonstration of a right stapes implant. Left hearing aid device in position. Orbits are unremarkable aside from prior lens extractions. Other: None. 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 Presence of the stapes implant on the right may preclude further evaluation with MRI. No CT evidence of large territory infarct, hemorrhage or extra-axial collection. Chronic parenchymal volume loss and sequela of microvascular angiopathy. Extensive intracranial atherosclerosis. These results were called by telephone at the time of interpretation on 09/09/2018 at 7:20 pm to Dr. Dorie Rank , who verbally acknowledged these results. Electronically Signed: By: Lovena Le M.D. On: 09/09/2018 19:20     Procedures Procedures (including critical care time)  Medications Ordered in ED Medications - No data to display   Initial Impression / Assessment and Plan / ED Course  I have reviewed the triage vital signs and the nursing notes.  Pertinent labs & imaging results that were available during my care of the patient were reviewed by me and considered in my medical decision making (see chart for details).  Clinical Course as of Sep 09 2023  Tue Sep 09, 2018  1737 Would consider MRI however patient has a pacemaker.  We will proceed with CT scan.  Neurologic exam is reassuring at this time.   [JK]  2008 Labs reviewed.  No definite abnormalities.   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      [JK]    Clinical Course User Index [JK] Dorie Rank, MD  Patient's neurologic exam in the ED was reassuring.  Sensation was intact.  He had isolated numbness in his left hand but no functional deficits on exam.  Occult stroke is certainly a possibility but overall have a lower suspicion.  Patient is not a candidate for MRI because of his pacemaker and metallic implants.  CT scan does show a destructive lesion in the right lateral aspect of the clivus.  I discussed these findings with the patient.  He is aware of this and follows up with a neurosurgeon at [facility].  Patient overall feels well.  His symptoms continue to improve.  I think is reasonable for outpatient follow-up.  Final Clinical Impressions(s) / ED Diagnoses   Final diagnoses:  Numbness    ED Discharge Orders    None       Dorie Rank, MD 09/09/18 2025

## 2018-09-09 NOTE — Discharge Instructions (Signed)
Follow up with your neurosurgeon at [facility] regarding the destructive bone lesion in the brain, follow up with your neurologist regarding your hand numbness

## 2018-09-09 NOTE — ED Triage Notes (Signed)
Pt arrived by [facility] from home, pt reports increase in weakness x 2 weeks and woke up this am with numbness/tingling to right hand.

## 2018-09-09 NOTE — ED Notes (Signed)
Pt currently in ventricular bigeminy, denies and CP or SOB.   States he feels like pacemaker is skipping a beat, "not exactly hooked up right".   State it is thumping in stomach.

## 2018-09-12 DIAGNOSIS — Z7952 Long term (current) use of systemic steroids: Secondary | ICD-10-CM | POA: Diagnosis not present

## 2018-09-12 DIAGNOSIS — Z9181 History of falling: Secondary | ICD-10-CM | POA: Diagnosis not present

## 2018-09-12 DIAGNOSIS — Z7902 Long term (current) use of antithrombotics/antiplatelets: Secondary | ICD-10-CM | POA: Diagnosis not present

## 2018-09-12 DIAGNOSIS — M5416 Radiculopathy, lumbar region: Secondary | ICD-10-CM | POA: Diagnosis not present

## 2018-09-15 DIAGNOSIS — C44319 Basal cell carcinoma of skin of other parts of face: Secondary | ICD-10-CM | POA: Diagnosis not present

## 2018-09-15 DIAGNOSIS — Z85828 Personal history of other malignant neoplasm of skin: Secondary | ICD-10-CM | POA: Diagnosis not present

## 2018-09-15 DIAGNOSIS — L570 Actinic keratosis: Secondary | ICD-10-CM | POA: Diagnosis not present

## 2018-09-16 DIAGNOSIS — M5416 Radiculopathy, lumbar region: Secondary | ICD-10-CM | POA: Diagnosis not present

## 2018-09-16 DIAGNOSIS — Z7902 Long term (current) use of antithrombotics/antiplatelets: Secondary | ICD-10-CM | POA: Diagnosis not present

## 2018-09-16 DIAGNOSIS — Z7952 Long term (current) use of systemic steroids: Secondary | ICD-10-CM | POA: Diagnosis not present

## 2018-09-16 DIAGNOSIS — Z9181 History of falling: Secondary | ICD-10-CM | POA: Diagnosis not present

## 2018-09-18 DIAGNOSIS — Z7952 Long term (current) use of systemic steroids: Secondary | ICD-10-CM | POA: Diagnosis not present

## 2018-09-18 DIAGNOSIS — M5416 Radiculopathy, lumbar region: Secondary | ICD-10-CM | POA: Diagnosis not present

## 2018-09-18 DIAGNOSIS — Z7902 Long term (current) use of antithrombotics/antiplatelets: Secondary | ICD-10-CM | POA: Diagnosis not present

## 2018-09-18 DIAGNOSIS — Z9181 History of falling: Secondary | ICD-10-CM | POA: Diagnosis not present

## 2018-09-19 ENCOUNTER — Telehealth: Payer: Self-pay | Admitting: Interventional Cardiology

## 2018-09-19 NOTE — Telephone Encounter (Signed)
I spoke with pt and his caregiver. They report pt has been coughing up white phlegm for a couple of weeks.  Was seen in ED on 7/14 and pt reports he had cough at that time.  Also has had swelling in feet for several weeks. He reports it goes down at times. Has been trying to keep feet and legs elevated. Takes extra lasix at times.  Last took extra dose last week. Does not weigh daily. Has shortness of breath "once in awhile."  I offered to schedule pt for virtual or in person visit.  Pt would like to do this but refuses to see PA or NP.  He would like to wait until he can see Dr. Irish Lack. Pt aware Dr. Irish Lack is not in office next week.

## 2018-09-19 NOTE — Telephone Encounter (Signed)
New Message   Patients caregiver Jana Half is calling on behalf of patient. She states that he has been coughing a bit more as well as some swelling in his foot. He has been coughing up a lot of white phlegm She is wanting to speak with a nurse

## 2018-09-22 NOTE — Progress Notes (Signed)
No ICM remote transmission received for 09/08/2018 and next ICM transmission scheduled for 10/06/2018.   

## 2018-09-22 NOTE — Telephone Encounter (Signed)
If sx are persisting, ok to check BMet and BNP.  If he is better, continue using extra Lasix as needed.

## 2018-09-22 NOTE — Telephone Encounter (Signed)
Spoke with pt and pt is feeling better Pt wants an appt with Dr Irish Lack first available is 12-05-18 at 1:40 pm. Pt will continue to monitor and if needs to be seen sooner will call back ./cy

## 2018-09-23 ENCOUNTER — Ambulatory Visit: Payer: Medicare HMO | Admitting: Cardiology

## 2018-09-24 DIAGNOSIS — M5416 Radiculopathy, lumbar region: Secondary | ICD-10-CM | POA: Diagnosis not present

## 2018-09-24 DIAGNOSIS — M5136 Other intervertebral disc degeneration, lumbar region: Secondary | ICD-10-CM | POA: Diagnosis not present

## 2018-09-24 DIAGNOSIS — Z9181 History of falling: Secondary | ICD-10-CM | POA: Diagnosis not present

## 2018-09-24 DIAGNOSIS — Z7902 Long term (current) use of antithrombotics/antiplatelets: Secondary | ICD-10-CM | POA: Diagnosis not present

## 2018-09-24 DIAGNOSIS — Z7952 Long term (current) use of systemic steroids: Secondary | ICD-10-CM | POA: Diagnosis not present

## 2018-10-06 ENCOUNTER — Ambulatory Visit (INDEPENDENT_AMBULATORY_CARE_PROVIDER_SITE_OTHER): Payer: Medicare HMO

## 2018-10-06 DIAGNOSIS — I5022 Chronic systolic (congestive) heart failure: Secondary | ICD-10-CM

## 2018-10-06 DIAGNOSIS — Z950 Presence of cardiac pacemaker: Secondary | ICD-10-CM

## 2018-10-07 ENCOUNTER — Telehealth: Payer: Self-pay

## 2018-10-07 NOTE — Telephone Encounter (Signed)
ICM call to patient:  2 ER visits 6/23 and 7/14.  SX: Reports some SOB, loss of appetite and overall weakness.    Report showing a significant decrease suggesting possible fluid accumulation 7/12 - 7/29 which correlates with ER visit. Also decreased from 8/2 - 8/7.    Prescribed:Torsemide 20 mg 1 tablet daily. Take 1 extra Torsemide tablet for weight gain of 3 lbs. Taking Potassium 10 mEq 1 tablet daily.   Labs: 09/09/2018 Creatinine 1.52, BUN 18, Potassium 4.2, Sodium 130, GFR 40-46 08/19/2018 Creatinine 1.32, BUN 20, Potassium 3.8, Sodium 136, GFR 47-55  A complete set of results can be found in Results Review.  Recommendations:Sent to Dr Irish Lack for review and recommendations if needed.  Follow-up plan: ICM clinic phone appointment on8/24/2020 to recheck fluid levels.  Office with Dr Irish Lack 12/05/2018.  Direct Trend Viewer:   3 month ICM trend: 10/06/2018    1 Year ICM trend:

## 2018-10-07 NOTE — Progress Notes (Signed)
EPIC Encounter for ICM Monitoring  Patient Name: [name] is a 85 y.o. male Date: 10/07/2018 Primary Care Physican: Lawerance Cruel, MD Primary Cardiologist:[place]  Electrophysiologist:Taylor Last Weight:176 - 180 lbs   Spoke with patient and he reported he has not been feeling well. He has some SOB and feeling overall weakness.  He took extra Torsemide x 2 days within the last week.   CorVuethoracic impedancenormalfor past 2-3 days after he took extra Torsemide. Impedance suggestive of possible fluid accumulation from 7/12-7/29 which correlates with ER visit.  8/2 - 8/7  Prescribed:Torsemide 20 mg 1 tablet daily. Take 1 extra Torsemide tablet for weight gain of 3 lbs.Potassium 10 mEq 1 tablet daily  Labs: 09/09/2018 Creatinine 1.52, BUN 18, Potassium 4.2, Sodium 130, GFR 40-46 08/19/2018 Creatinine 1.32, BUN 20, Potassium 3.8, Sodium 136, GFR 47-55  A complete set of results can be found in Results Review.  Recommendations:Phone note sent to Dr Irish Lack for review and recommendations if needed.  Follow-up plan: ICM clinic phone appointment on8/24/2020 to recheck fluid levels.  Copy of ICM check sent to Dr.Taylor and Dr Irish Lack.   Direct Trend Viewer:   3 month ICM trend: 10/06/2018    1 Year ICM trend:       Rosalene Billings, RN 10/07/2018 4:10 PM

## 2018-10-07 NOTE — Telephone Encounter (Signed)
Remote ICM transmission received.  Attempted call to patient regarding ICM remote transmission and no answer.  

## 2018-10-08 DIAGNOSIS — Z9181 History of falling: Secondary | ICD-10-CM | POA: Diagnosis not present

## 2018-10-08 DIAGNOSIS — Z7952 Long term (current) use of systemic steroids: Secondary | ICD-10-CM | POA: Diagnosis not present

## 2018-10-08 DIAGNOSIS — Z7902 Long term (current) use of antithrombotics/antiplatelets: Secondary | ICD-10-CM | POA: Diagnosis not present

## 2018-10-08 DIAGNOSIS — M5416 Radiculopathy, lumbar region: Secondary | ICD-10-CM | POA: Diagnosis not present

## 2018-10-08 NOTE — Telephone Encounter (Signed)
He should take an extra torsemide dose for the next 2 days with potassium due to fluid accumulation.

## 2018-10-08 NOTE — Telephone Encounter (Signed)
Left message for patient to call back  

## 2018-10-08 NOTE — Telephone Encounter (Signed)
Follow up ° ° °Patient is returning your call. Please call. ° ° ° °

## 2018-10-09 DIAGNOSIS — Z0100 Encounter for examination of eyes and vision without abnormal findings: Secondary | ICD-10-CM | POA: Diagnosis not present

## 2018-10-09 NOTE — Telephone Encounter (Signed)
Called and spoke to patient and his caregiver and made them aware of Dr. Hassell Done recommendations to take an extra torsemide with potassium for the next 2 days. Instructed for the patient to call for increased SOB, swelling, or weight gain. They verbalize understanding.

## 2018-10-10 NOTE — Progress Notes (Signed)
Per phone note. Patient contacted by Drue Novel, RN to provide following recommendations from Dr Irish Lack.  Jettie Booze, MD to Drue Novel I, RN    Note   He should take an extra torsemide dose for the next 2 days with potassium due to fluid accumulation.

## 2018-10-13 ENCOUNTER — Ambulatory Visit (INDEPENDENT_AMBULATORY_CARE_PROVIDER_SITE_OTHER): Payer: Medicare HMO | Admitting: *Deleted

## 2018-10-13 DIAGNOSIS — I442 Atrioventricular block, complete: Secondary | ICD-10-CM | POA: Diagnosis not present

## 2018-10-14 ENCOUNTER — Telehealth: Payer: Self-pay

## 2018-10-14 ENCOUNTER — Other Ambulatory Visit: Payer: Self-pay | Admitting: Gastroenterology

## 2018-10-14 DIAGNOSIS — R066 Hiccough: Secondary | ICD-10-CM | POA: Diagnosis not present

## 2018-10-14 DIAGNOSIS — R142 Eructation: Secondary | ICD-10-CM | POA: Diagnosis not present

## 2018-10-14 DIAGNOSIS — R630 Anorexia: Secondary | ICD-10-CM | POA: Diagnosis not present

## 2018-10-14 DIAGNOSIS — R1314 Dysphagia, pharyngoesophageal phase: Secondary | ICD-10-CM | POA: Diagnosis not present

## 2018-10-14 DIAGNOSIS — R198 Other specified symptoms and signs involving the digestive system and abdomen: Secondary | ICD-10-CM | POA: Diagnosis not present

## 2018-10-14 LAB — CUP PACEART REMOTE DEVICE CHECK
Battery Remaining Longevity: 97 mo
Battery Remaining Percentage: 95.5 %
Battery Voltage: 2.98 V
Date Time Interrogation Session: 20200818152056
Implantable Lead Implant Date: 20161205
Implantable Lead Implant Date: 20161205
Implantable Lead Implant Date: 20161205
Implantable Lead Location: 753858
Implantable Lead Location: 753859
Implantable Lead Location: 753860
Implantable Pulse Generator Implant Date: 20161205
Lead Channel Impedance Value: 480 Ohm
Lead Channel Impedance Value: 980 Ohm
Lead Channel Pacing Threshold Amplitude: 0.75 V
Lead Channel Pacing Threshold Amplitude: 1 V
Lead Channel Pacing Threshold Pulse Width: 0.5 ms
Lead Channel Pacing Threshold Pulse Width: 0.6 ms
Lead Channel Sensing Intrinsic Amplitude: 12 mV
Lead Channel Setting Pacing Amplitude: 1.75 V
Lead Channel Setting Pacing Amplitude: 2.5 V
Lead Channel Setting Pacing Pulse Width: 0.5 ms
Lead Channel Setting Pacing Pulse Width: 0.8 ms
Lead Channel Setting Sensing Sensitivity: 2 mV
Pulse Gen Model: 3262
Pulse Gen Serial Number: 7802901

## 2018-10-14 NOTE — Telephone Encounter (Signed)
Spoke with patient to remind of missed remote transmission 

## 2018-10-16 DIAGNOSIS — Z9181 History of falling: Secondary | ICD-10-CM | POA: Diagnosis not present

## 2018-10-16 DIAGNOSIS — Z7902 Long term (current) use of antithrombotics/antiplatelets: Secondary | ICD-10-CM | POA: Diagnosis not present

## 2018-10-16 DIAGNOSIS — M5416 Radiculopathy, lumbar region: Secondary | ICD-10-CM | POA: Diagnosis not present

## 2018-10-16 DIAGNOSIS — Z7952 Long term (current) use of systemic steroids: Secondary | ICD-10-CM | POA: Diagnosis not present

## 2018-10-21 ENCOUNTER — Telehealth: Payer: Self-pay

## 2018-10-21 ENCOUNTER — Encounter: Payer: Self-pay | Admitting: Cardiology

## 2018-10-21 NOTE — Progress Notes (Signed)
Remote pacemaker transmission.   

## 2018-10-21 NOTE — Telephone Encounter (Signed)
Left message for patient to remind of missed remote transmission.  

## 2018-10-22 ENCOUNTER — Other Ambulatory Visit: Payer: Self-pay | Admitting: Gastroenterology

## 2018-10-22 ENCOUNTER — Ambulatory Visit
Admission: RE | Admit: 2018-10-22 | Discharge: 2018-10-22 | Disposition: A | Discharge status: Home / Self Care | Payer: Medicare HMO | Source: Ambulatory Visit | Attending: Gastroenterology | Admitting: Gastroenterology

## 2018-10-22 ENCOUNTER — Other Ambulatory Visit: Payer: Medicare HMO

## 2018-10-22 DIAGNOSIS — K219 Gastro-esophageal reflux disease without esophagitis: Secondary | ICD-10-CM | POA: Diagnosis not present

## 2018-10-22 DIAGNOSIS — R1314 Dysphagia, pharyngoesophageal phase: Secondary | ICD-10-CM

## 2018-10-22 DIAGNOSIS — R1310 Dysphagia, unspecified: Secondary | ICD-10-CM | POA: Diagnosis not present

## 2018-10-23 DIAGNOSIS — Z7902 Long term (current) use of antithrombotics/antiplatelets: Secondary | ICD-10-CM | POA: Diagnosis not present

## 2018-10-23 DIAGNOSIS — M5416 Radiculopathy, lumbar region: Secondary | ICD-10-CM | POA: Diagnosis not present

## 2018-10-23 DIAGNOSIS — Z7952 Long term (current) use of systemic steroids: Secondary | ICD-10-CM | POA: Diagnosis not present

## 2018-10-23 DIAGNOSIS — Z9181 History of falling: Secondary | ICD-10-CM | POA: Diagnosis not present

## 2018-10-24 DIAGNOSIS — M5416 Radiculopathy, lumbar region: Secondary | ICD-10-CM | POA: Diagnosis not present

## 2018-10-24 DIAGNOSIS — Z7952 Long term (current) use of systemic steroids: Secondary | ICD-10-CM | POA: Diagnosis not present

## 2018-10-24 DIAGNOSIS — Z9181 History of falling: Secondary | ICD-10-CM | POA: Diagnosis not present

## 2018-10-24 DIAGNOSIS — Z7902 Long term (current) use of antithrombotics/antiplatelets: Secondary | ICD-10-CM | POA: Diagnosis not present

## 2018-10-27 NOTE — Progress Notes (Signed)
No ICM remote transmission received for 12/20/2018 and next ICM transmission scheduled for 12/03/2018.

## 2018-10-28 DIAGNOSIS — Z7902 Long term (current) use of antithrombotics/antiplatelets: Secondary | ICD-10-CM | POA: Diagnosis not present

## 2018-10-28 DIAGNOSIS — Z7952 Long term (current) use of systemic steroids: Secondary | ICD-10-CM | POA: Diagnosis not present

## 2018-10-28 DIAGNOSIS — Z9181 History of falling: Secondary | ICD-10-CM | POA: Diagnosis not present

## 2018-10-28 DIAGNOSIS — M5416 Radiculopathy, lumbar region: Secondary | ICD-10-CM | POA: Diagnosis not present

## 2018-10-29 DIAGNOSIS — C61 Malignant neoplasm of prostate: Secondary | ICD-10-CM | POA: Diagnosis not present

## 2018-11-04 DIAGNOSIS — H401133 Primary open-angle glaucoma, bilateral, severe stage: Secondary | ICD-10-CM | POA: Diagnosis not present

## 2018-11-04 DIAGNOSIS — H524 Presbyopia: Secondary | ICD-10-CM | POA: Diagnosis not present

## 2018-11-05 DIAGNOSIS — M5416 Radiculopathy, lumbar region: Secondary | ICD-10-CM | POA: Diagnosis not present

## 2018-11-05 DIAGNOSIS — Z7952 Long term (current) use of systemic steroids: Secondary | ICD-10-CM | POA: Diagnosis not present

## 2018-11-05 DIAGNOSIS — Z7902 Long term (current) use of antithrombotics/antiplatelets: Secondary | ICD-10-CM | POA: Diagnosis not present

## 2018-11-05 DIAGNOSIS — Z9181 History of falling: Secondary | ICD-10-CM | POA: Diagnosis not present

## 2018-11-12 DIAGNOSIS — Z7902 Long term (current) use of antithrombotics/antiplatelets: Secondary | ICD-10-CM | POA: Diagnosis not present

## 2018-11-12 DIAGNOSIS — Z9181 History of falling: Secondary | ICD-10-CM | POA: Diagnosis not present

## 2018-11-12 DIAGNOSIS — Z7952 Long term (current) use of systemic steroids: Secondary | ICD-10-CM | POA: Diagnosis not present

## 2018-11-12 DIAGNOSIS — M5416 Radiculopathy, lumbar region: Secondary | ICD-10-CM | POA: Diagnosis not present

## 2018-11-18 DIAGNOSIS — L570 Actinic keratosis: Secondary | ICD-10-CM | POA: Diagnosis not present

## 2018-11-18 DIAGNOSIS — Z85828 Personal history of other malignant neoplasm of skin: Secondary | ICD-10-CM | POA: Diagnosis not present

## 2018-11-19 DIAGNOSIS — N183 Chronic kidney disease, stage 3 (moderate): Secondary | ICD-10-CM | POA: Diagnosis not present

## 2018-11-19 DIAGNOSIS — I10 Essential (primary) hypertension: Secondary | ICD-10-CM | POA: Diagnosis not present

## 2018-11-19 DIAGNOSIS — N400 Enlarged prostate without lower urinary tract symptoms: Secondary | ICD-10-CM | POA: Diagnosis not present

## 2018-11-19 DIAGNOSIS — F419 Anxiety disorder, unspecified: Secondary | ICD-10-CM | POA: Diagnosis not present

## 2018-11-19 DIAGNOSIS — M5416 Radiculopathy, lumbar region: Secondary | ICD-10-CM | POA: Diagnosis not present

## 2018-11-20 DIAGNOSIS — M25522 Pain in left elbow: Secondary | ICD-10-CM | POA: Diagnosis not present

## 2018-11-20 DIAGNOSIS — M47817 Spondylosis without myelopathy or radiculopathy, lumbosacral region: Secondary | ICD-10-CM | POA: Diagnosis not present

## 2018-11-20 DIAGNOSIS — M5136 Other intervertebral disc degeneration, lumbar region: Secondary | ICD-10-CM | POA: Diagnosis not present

## 2018-11-20 DIAGNOSIS — Z7902 Long term (current) use of antithrombotics/antiplatelets: Secondary | ICD-10-CM | POA: Diagnosis not present

## 2018-11-20 DIAGNOSIS — Z7952 Long term (current) use of systemic steroids: Secondary | ICD-10-CM | POA: Diagnosis not present

## 2018-11-20 DIAGNOSIS — Z9181 History of falling: Secondary | ICD-10-CM | POA: Diagnosis not present

## 2018-11-20 DIAGNOSIS — M5416 Radiculopathy, lumbar region: Secondary | ICD-10-CM | POA: Diagnosis not present

## 2018-11-21 ENCOUNTER — Telehealth: Payer: Self-pay | Admitting: *Deleted

## 2018-11-21 NOTE — Telephone Encounter (Signed)
   [facility] Pre-operative Risk Assessment    Request for surgical clearance:  1. What type of surgery is being performed? LUMBAR SPINE TRANSFORAMINAL INJECTION L5-S1  2. When is this surgery scheduled? TBD   3. What type of clearance is required (medical clearance vs. Pharmacy clearance to hold med vs. Both)? MEDICAL  4. Are there any medications that need to be held prior to surgery and how long? PLAVIX X 7 DAYS PRIOR TO PROCEDURE   5. Practice name and name of physician performing surgery? [facility]; DR. PAUL HARKINS   6. What is your office phone number [phone]    7.   What is your office fax number [phone]  8.   Anesthesia type (None, local, MAC, general) ? NOT LISTED; LOCAL?   Julaine Hua 11/21/2018, 9:34 AM  _________________________________________________________________   (provider comments below)

## 2018-11-21 NOTE — Telephone Encounter (Signed)
Dr. Irish Lack to review, low risk procedure. Last PCI was in July 2015. Upcoming annual visit with Dr. Irish Lack on 12/05/2018. Dr. Irish Lack, ok with you to hold plavix for 7 days prior to back injection

## 2018-11-24 NOTE — Telephone Encounter (Signed)
Pt called, will need to call us back. He was unavailable to talk.

## 2018-11-24 NOTE — Telephone Encounter (Signed)
Ok to hold plavix 7 days prior to back injection

## 2018-11-28 DIAGNOSIS — M5416 Radiculopathy, lumbar region: Secondary | ICD-10-CM | POA: Diagnosis not present

## 2018-11-28 DIAGNOSIS — Z7902 Long term (current) use of antithrombotics/antiplatelets: Secondary | ICD-10-CM | POA: Diagnosis not present

## 2018-11-28 DIAGNOSIS — Z7952 Long term (current) use of systemic steroids: Secondary | ICD-10-CM | POA: Diagnosis not present

## 2018-11-28 DIAGNOSIS — Z9181 History of falling: Secondary | ICD-10-CM | POA: Diagnosis not present

## 2018-12-03 ENCOUNTER — Ambulatory Visit (INDEPENDENT_AMBULATORY_CARE_PROVIDER_SITE_OTHER): Payer: Medicare HMO

## 2018-12-03 DIAGNOSIS — H90A31 Mixed conductive and sensorineural hearing loss, unilateral, right ear with restricted hearing on the contralateral side: Secondary | ICD-10-CM | POA: Diagnosis not present

## 2018-12-03 DIAGNOSIS — Z950 Presence of cardiac pacemaker: Secondary | ICD-10-CM

## 2018-12-03 DIAGNOSIS — I5022 Chronic systolic (congestive) heart failure: Secondary | ICD-10-CM

## 2018-12-05 ENCOUNTER — Ambulatory Visit: Payer: Medicare HMO | Admitting: Interventional Cardiology

## 2018-12-05 NOTE — Progress Notes (Signed)
EPIC Encounter for ICM Monitoring  Patient Name: [name] is a 85 y.o. male Date: 12/05/2018 Primary Care Physican: Lawerance Cruel, MD Primary Cardiologist:Varanasi  Electrophysiologist:Taylor Last Weight:176 - 180 lbs   Transmission reviewed.   CorVuethoracic impedancenormal.  Prescribed:Torsemide 20 mg 1 tablet daily. Take 1 extra Torsemide tablet if needed.  Labs: 09/09/2018 Creatinine 1.52, BUN 18, Potassium 4.2, Sodium 130, GFR 40-46 08/19/2018 Creatinine 1.32, BUN 20, Potassium 3.8, Sodium 136, GFR 47-55  A complete set of results can be found in Results Review.  Recommendations: None  Follow-up plan: ICM clinic phone appointment on 01/13/2019.   91 day device clinic remote transmission 01/12/2019.  Office appt 02/06/2019 with Dr. Irish Lack.    Copy of ICM check sent to Dr. Lovena Le.   3 month ICM trend: 12/03/2018    1 Year ICM trend:       Rosalene Billings, RN 12/05/2018 4:38 PM

## 2018-12-08 NOTE — Telephone Encounter (Signed)
I have called and spoke with the patient and his son, there is currently no plan for back injection at this time. I will remove this cardiac clearance from preop pool.

## 2018-12-10 DIAGNOSIS — Z7952 Long term (current) use of systemic steroids: Secondary | ICD-10-CM | POA: Diagnosis not present

## 2018-12-10 DIAGNOSIS — Z7902 Long term (current) use of antithrombotics/antiplatelets: Secondary | ICD-10-CM | POA: Diagnosis not present

## 2018-12-10 DIAGNOSIS — Z9181 History of falling: Secondary | ICD-10-CM | POA: Diagnosis not present

## 2018-12-10 DIAGNOSIS — M5416 Radiculopathy, lumbar region: Secondary | ICD-10-CM | POA: Diagnosis not present

## 2018-12-17 DIAGNOSIS — M5416 Radiculopathy, lumbar region: Secondary | ICD-10-CM | POA: Diagnosis not present

## 2018-12-17 DIAGNOSIS — Z9181 History of falling: Secondary | ICD-10-CM | POA: Diagnosis not present

## 2018-12-17 DIAGNOSIS — Z7952 Long term (current) use of systemic steroids: Secondary | ICD-10-CM | POA: Diagnosis not present

## 2018-12-17 DIAGNOSIS — Z7902 Long term (current) use of antithrombotics/antiplatelets: Secondary | ICD-10-CM | POA: Diagnosis not present

## 2018-12-24 DIAGNOSIS — Z7902 Long term (current) use of antithrombotics/antiplatelets: Secondary | ICD-10-CM | POA: Diagnosis not present

## 2018-12-24 DIAGNOSIS — M5416 Radiculopathy, lumbar region: Secondary | ICD-10-CM | POA: Diagnosis not present

## 2018-12-24 DIAGNOSIS — Z9181 History of falling: Secondary | ICD-10-CM | POA: Diagnosis not present

## 2018-12-24 DIAGNOSIS — Z7952 Long term (current) use of systemic steroids: Secondary | ICD-10-CM | POA: Diagnosis not present

## 2018-12-30 DIAGNOSIS — Z7902 Long term (current) use of antithrombotics/antiplatelets: Secondary | ICD-10-CM | POA: Diagnosis not present

## 2018-12-30 DIAGNOSIS — Z7952 Long term (current) use of systemic steroids: Secondary | ICD-10-CM | POA: Diagnosis not present

## 2018-12-30 DIAGNOSIS — Z79891 Long term (current) use of opiate analgesic: Secondary | ICD-10-CM | POA: Diagnosis not present

## 2018-12-30 DIAGNOSIS — Z9181 History of falling: Secondary | ICD-10-CM | POA: Diagnosis not present

## 2018-12-30 DIAGNOSIS — M5416 Radiculopathy, lumbar region: Secondary | ICD-10-CM | POA: Diagnosis not present

## 2019-01-08 DIAGNOSIS — Z7902 Long term (current) use of antithrombotics/antiplatelets: Secondary | ICD-10-CM | POA: Diagnosis not present

## 2019-01-08 DIAGNOSIS — M5416 Radiculopathy, lumbar region: Secondary | ICD-10-CM | POA: Diagnosis not present

## 2019-01-08 DIAGNOSIS — Z9181 History of falling: Secondary | ICD-10-CM | POA: Diagnosis not present

## 2019-01-08 DIAGNOSIS — Z7952 Long term (current) use of systemic steroids: Secondary | ICD-10-CM | POA: Diagnosis not present

## 2019-01-08 DIAGNOSIS — Z79891 Long term (current) use of opiate analgesic: Secondary | ICD-10-CM | POA: Diagnosis not present

## 2019-01-12 ENCOUNTER — Ambulatory Visit (INDEPENDENT_AMBULATORY_CARE_PROVIDER_SITE_OTHER): Payer: Medicare HMO | Admitting: *Deleted

## 2019-01-12 DIAGNOSIS — I4821 Permanent atrial fibrillation: Secondary | ICD-10-CM | POA: Diagnosis not present

## 2019-01-12 DIAGNOSIS — I447 Left bundle-branch block, unspecified: Secondary | ICD-10-CM

## 2019-01-13 ENCOUNTER — Telehealth: Payer: Self-pay

## 2019-01-13 ENCOUNTER — Ambulatory Visit (INDEPENDENT_AMBULATORY_CARE_PROVIDER_SITE_OTHER): Payer: Medicare HMO

## 2019-01-13 DIAGNOSIS — Z950 Presence of cardiac pacemaker: Secondary | ICD-10-CM | POA: Diagnosis not present

## 2019-01-13 DIAGNOSIS — I5022 Chronic systolic (congestive) heart failure: Secondary | ICD-10-CM | POA: Diagnosis not present

## 2019-01-13 NOTE — Telephone Encounter (Signed)
Left message for patient to remind of missed remote transmission.  

## 2019-01-14 DIAGNOSIS — Z7952 Long term (current) use of systemic steroids: Secondary | ICD-10-CM | POA: Diagnosis not present

## 2019-01-14 DIAGNOSIS — Z79891 Long term (current) use of opiate analgesic: Secondary | ICD-10-CM | POA: Diagnosis not present

## 2019-01-14 DIAGNOSIS — Z9181 History of falling: Secondary | ICD-10-CM | POA: Diagnosis not present

## 2019-01-14 DIAGNOSIS — M5416 Radiculopathy, lumbar region: Secondary | ICD-10-CM | POA: Diagnosis not present

## 2019-01-14 DIAGNOSIS — Z7902 Long term (current) use of antithrombotics/antiplatelets: Secondary | ICD-10-CM | POA: Diagnosis not present

## 2019-01-15 LAB — CUP PACEART REMOTE DEVICE CHECK
Battery Remaining Longevity: 96 mo
Battery Remaining Percentage: 95 %
Battery Voltage: 2.98 V
Brady Statistic RV Percent Paced: 68 %
Date Time Interrogation Session: 20201119105325
Implantable Lead Implant Date: 20161205
Implantable Lead Implant Date: 20161205
Implantable Lead Implant Date: 20161205
Implantable Lead Location: 753858
Implantable Lead Location: 753859
Implantable Lead Location: 753860
Implantable Pulse Generator Implant Date: 20161205
Lead Channel Impedance Value: 1025 Ohm
Lead Channel Impedance Value: 460 Ohm
Lead Channel Sensing Intrinsic Amplitude: 12 mV
Lead Channel Setting Pacing Amplitude: 1.75 V
Lead Channel Setting Pacing Amplitude: 2.5 V
Lead Channel Setting Pacing Pulse Width: 0.5 ms
Lead Channel Setting Pacing Pulse Width: 0.8 ms
Lead Channel Setting Sensing Sensitivity: 2 mV
Pulse Gen Model: 3262
Pulse Gen Serial Number: 7802901

## 2019-01-16 NOTE — Progress Notes (Signed)
EPIC Encounter for ICM Monitoring  Patient Name: [name] is a 85 y.o. male Date: 01/16/2019 Primary Care Physican: Lawerance Cruel, MD Primary Cardiologist:Varanasi  Electrophysiologist:Taylor Last Weight:176 - 180 lbs   Spoke with patient. He denies fluid symptoms but is having back pain.  CorVuethoracic impedancenormal.  Prescribed:Torsemide 20 mg 1 tablet daily. Take 1 extra Torsemide tablet if needed.  Labs: 09/09/2018 Creatinine1.52, BUN18, Potassium4.2, Sodium130, [id number] 08/19/2018 Creatinine1.32, BUN20, Potassium3.8, [id number], [id number] A complete set of results can be found in Results Review.  Recommendations: None  Follow-up plan: ICM clinic phone appointment on 02/23/2019.   91 day device clinic remote transmission 04/13/2019.  Office appt 02/06/2019 with Dr. Irish Lack.    Copy of ICM check sent to Dr. Lovena Le.   3 month ICM trend: 01/13/2019    1 Year ICM trend:       Nathan Billings, RN 01/16/2019 3:11 PM

## 2019-01-19 DIAGNOSIS — Z79891 Long term (current) use of opiate analgesic: Secondary | ICD-10-CM | POA: Diagnosis not present

## 2019-01-19 DIAGNOSIS — Z7902 Long term (current) use of antithrombotics/antiplatelets: Secondary | ICD-10-CM | POA: Diagnosis not present

## 2019-01-19 DIAGNOSIS — M5416 Radiculopathy, lumbar region: Secondary | ICD-10-CM | POA: Diagnosis not present

## 2019-01-19 DIAGNOSIS — Z7952 Long term (current) use of systemic steroids: Secondary | ICD-10-CM | POA: Diagnosis not present

## 2019-01-19 DIAGNOSIS — Z9181 History of falling: Secondary | ICD-10-CM | POA: Diagnosis not present

## 2019-01-28 DIAGNOSIS — Z7902 Long term (current) use of antithrombotics/antiplatelets: Secondary | ICD-10-CM | POA: Diagnosis not present

## 2019-01-28 DIAGNOSIS — Z9181 History of falling: Secondary | ICD-10-CM | POA: Diagnosis not present

## 2019-01-28 DIAGNOSIS — M5416 Radiculopathy, lumbar region: Secondary | ICD-10-CM | POA: Diagnosis not present

## 2019-01-28 DIAGNOSIS — Z7952 Long term (current) use of systemic steroids: Secondary | ICD-10-CM | POA: Diagnosis not present

## 2019-01-28 DIAGNOSIS — Z79891 Long term (current) use of opiate analgesic: Secondary | ICD-10-CM | POA: Diagnosis not present

## 2019-01-30 ENCOUNTER — Encounter (INDEPENDENT_AMBULATORY_CARE_PROVIDER_SITE_OTHER): Payer: Medicare HMO | Admitting: Ophthalmology

## 2019-01-30 DIAGNOSIS — H43813 Vitreous degeneration, bilateral: Secondary | ICD-10-CM

## 2019-01-30 DIAGNOSIS — H348322 Tributary (branch) retinal vein occlusion, left eye, stable: Secondary | ICD-10-CM | POA: Diagnosis not present

## 2019-01-30 DIAGNOSIS — H35033 Hypertensive retinopathy, bilateral: Secondary | ICD-10-CM

## 2019-01-30 DIAGNOSIS — H353132 Nonexudative age-related macular degeneration, bilateral, intermediate dry stage: Secondary | ICD-10-CM

## 2019-01-30 DIAGNOSIS — I10 Essential (primary) hypertension: Secondary | ICD-10-CM

## 2019-02-02 ENCOUNTER — Telehealth: Payer: Self-pay | Admitting: *Deleted

## 2019-02-02 NOTE — Telephone Encounter (Signed)
   Primary Cardiologist: Larae Grooms, MD  Chart reviewed as part of pre-operative protocol coverage. Given past medical history and time since last visit, based on ACC/AHA guidelines, [name] would be at acceptable risk for the planned procedure without further cardiovascular testing.   OK  To hold Plavix 7 days pre op if needed.  I will route this recommendation to the requesting party via Epic fax function and remove from pre-op pool.  Please call with questions.  Kerin Ransom, PA-C 02/02/2019, 4:14 PM

## 2019-02-02 NOTE — Telephone Encounter (Signed)
   [facility] Pre-operative Risk Assessment    Request for surgical clearance:  1. What type of surgery is being performed? LUMBAR SPINE TRANS FORAMINAL INJECTION   2. When is this surgery scheduled? 02/09/19   3. What type of clearance is required (medical clearance vs. Pharmacy clearance to hold med vs. Both)? MEDICAL  4. Are there any medications that need to be held prior to surgery and how long? PLAVIX X 7 DAYS PRIOR TO INJECTION   5. Practice name and name of physician performing surgery? [place]; DR. PAUL HARKINS   6. What is your office phone number [phone]    7.   What is your office fax number [phone]  8.   Anesthesia type (None, local, MAC, general) ? NOT LISTED   [name] 02/02/2019, 3:23 PM  _________________________________________________________________   (provider comments below)

## 2019-02-04 DIAGNOSIS — Z79891 Long term (current) use of opiate analgesic: Secondary | ICD-10-CM | POA: Diagnosis not present

## 2019-02-04 DIAGNOSIS — Z7952 Long term (current) use of systemic steroids: Secondary | ICD-10-CM | POA: Diagnosis not present

## 2019-02-04 DIAGNOSIS — Z9181 History of falling: Secondary | ICD-10-CM | POA: Diagnosis not present

## 2019-02-04 DIAGNOSIS — Z7902 Long term (current) use of antithrombotics/antiplatelets: Secondary | ICD-10-CM | POA: Diagnosis not present

## 2019-02-04 DIAGNOSIS — M5416 Radiculopathy, lumbar region: Secondary | ICD-10-CM | POA: Diagnosis not present

## 2019-02-06 ENCOUNTER — Ambulatory Visit: Payer: Medicare HMO | Admitting: Interventional Cardiology

## 2019-02-06 ENCOUNTER — Other Ambulatory Visit: Payer: Self-pay

## 2019-02-06 ENCOUNTER — Encounter: Payer: Self-pay | Admitting: Interventional Cardiology

## 2019-02-06 VITALS — BP 118/64 | HR 55 | Ht 67.0 in | Wt 173.0 lb

## 2019-02-06 DIAGNOSIS — I4821 Permanent atrial fibrillation: Secondary | ICD-10-CM | POA: Diagnosis not present

## 2019-02-06 DIAGNOSIS — I340 Nonrheumatic mitral (valve) insufficiency: Secondary | ICD-10-CM

## 2019-02-06 DIAGNOSIS — Z950 Presence of cardiac pacemaker: Secondary | ICD-10-CM

## 2019-02-06 DIAGNOSIS — I5022 Chronic systolic (congestive) heart failure: Secondary | ICD-10-CM | POA: Diagnosis not present

## 2019-02-06 DIAGNOSIS — I25118 Atherosclerotic heart disease of native coronary artery with other forms of angina pectoris: Secondary | ICD-10-CM | POA: Diagnosis not present

## 2019-02-06 LAB — COMPREHENSIVE METABOLIC PANEL
ALT: 10 IU/L (ref 0–44)
AST: 16 IU/L (ref 0–40)
Albumin/Globulin Ratio: 2.2 (ref 1.2–2.2)
Albumin: 4.1 g/dL (ref 3.5–4.6)
Alkaline Phosphatase: 94 IU/L (ref 39–117)
BUN/Creatinine Ratio: 14 (ref 10–24)
BUN: 22 mg/dL (ref 10–36)
Bilirubin Total: 0.8 mg/dL (ref 0.0–1.2)
CO2: 27 mmol/L (ref 20–29)
Calcium: 9.5 mg/dL (ref 8.6–10.2)
Chloride: 95 mmol/L — ABNORMAL LOW (ref 96–106)
Creatinine, Ser: 1.57 mg/dL — ABNORMAL HIGH (ref 0.76–1.27)
GFR calc Af Amer: 44 mL/min/{1.73_m2} — ABNORMAL LOW (ref >59)
GFR calc non Af Amer: 38 mL/min/{1.73_m2} — ABNORMAL LOW (ref >59)
Globulin, Total: 1.9 g/dL (ref 1.5–4.5)
Glucose: 113 mg/dL — ABNORMAL HIGH (ref 65–99)
Potassium: 3.5 mmol/L (ref 3.5–5.2)
Sodium: 137 mmol/L (ref 134–144)
Total Protein: 6 g/dL (ref 6.0–8.5)

## 2019-02-06 LAB — CBC
Hematocrit: 42.6 % (ref 37.5–51.0)
Hemoglobin: 14.8 g/dL (ref 13.0–17.7)
MCH: 31.3 pg (ref 26.6–33.0)
MCHC: 34.7 g/dL (ref 31.5–35.7)
MCV: 90 fL (ref 79–97)
Platelets: 143 10*3/uL — ABNORMAL LOW (ref 150–450)
RBC: 4.73 x10E6/uL (ref 4.14–5.80)
RDW: 13.6 % (ref 11.6–15.4)
WBC: 6.6 10*3/uL (ref 3.4–10.8)

## 2019-02-06 LAB — TSH: TSH: 2.17 u[IU]/mL (ref 0.450–4.500)

## 2019-02-06 NOTE — Progress Notes (Signed)
Remote pacemaker transmission.   

## 2019-02-06 NOTE — Progress Notes (Signed)
Cardiology Office Note   Date:  02/06/2019   ID:  SLATEN KILDOW, DOB 06/30/1926, MRN [id number]  PCP:  Lawerance Cruel, MD    No chief complaint on file.  CAD  Wt Readings from Last 3 Encounters:  02/06/19 173 lb (78.5 kg)  04/18/18 187 lb (84.8 kg)  12/05/17 186 lb 3.2 oz (84.5 kg)       History of Present Illness: [name] is a 85 y.o. male  with prior AFib, CAD and mitral regurgitation. He most recently had a complex PCI of his LAD in July 2015 involving rotational atherectomy. He had a hematoma in his right wrist at the time of the diagnostic cath. He had a right groin hematoma after the intervention. Prior to the intervention, his ejection fraction was in the 30-35% range. Echocardiogram about a month after the intervention showed an improvement in his EF from 50-55%. He continued to have some fatigue post procedure. He has had issues with his prostate and difficulty sleeping.  He then developed tachybrady syndrome and had a BiV - pacer placed.It tookHe was found to have AFib. He had GI bleeding with Warfarin. He did not tolerate Eliquis. Amio was started to maintain NSR, but he reverted to AFib even after DCCV. Most recently, in 9/17, Amio and Warfarin were stopped and Plavix was restarted since he had taken this without bleeding issues in the past.  In June 2019, he had volume overload.  He received IV Lasix and felt better.  He has been stable on torsemide.  He has some acid reflux.  He takes Protonix.   He is limited by back pain.  Back is still painful.    Denies : Chest pain. Dizziness.  Nitroglycerin use. Orthopnea. Palpitations. Paroxysmal nocturnal dyspnea. Shortness of breath. Syncope.   Appetite has decreased.  Has some fatigue.   Elevates legs to reduce edema.  Overall, he is surprised that he lived as long as he has. He stays out of crowds and wears a mask when he goes out.   Past Medical History:  Diagnosis Date  . Anxiety   .  Arthritis   . Atrial fibrillation, permanent 01/07/2016  . Blood transfusion    "related to a surgery"  . BPH (benign prostatic hypertrophy) with urinary obstruction    s/p turp yrs ago  . Bradycardia    a. Amio d/c'd 08/2013; brady arrest 08/2013 after PCI >>> recurrent AF >>> Amiodarone restarted. b. Pacemaker being considered in 11/2014.  [name] CAD (coronary artery disease)    a. s/p MI and prior PCI of LAD;  b. LHC (08/2013):  prox LAD 60-70%, mid LAD stents ok, ostial lesion at Dx jailed by stent, mild CFX and RCA disesase >>>  PCI (09/08/13):  rotational atherectomy + Promus DES to prox LAD  . Cardiomyopathy ([place])    a. Echo (08/2013):  EF 30-35%, AS hypokinesis, Gr 1 diast dysfn, mild MR, mild LAE >>> b. improved EF 50-55% by echo 8/15. c. EF down again by echo 12/2014 to 30-35% but 51% by nuc.  [name] Chronic lower back pain   . Chronic systolic CHF (congestive heart failure) ([place])   . CKD (chronic kidney disease), stage III ([place])    a. Per review of labs baseline Cr 1.1-1.3.  [name] DDD (degenerative disc disease)    chronic back pain  . Depression   . Diverticulitis of colon with bleeding    s/p sigmoid resection '88  . DJD (degenerative joint disease)  hips and knees   s/p bilateral total replacements  . GERD (gastroesophageal reflux disease)    occ. take prevacid  . History of GI diverticular bleed april 2012   transfused blood and resolved without surgical intervention  . Hypertension   . Impaired hearing bilateral    only left hearing aid  . Impotence, organic    s/p penile prosthesis 1990's  . Incomplete bladder emptying   . LBBB (left bundle branch block)   . Meningioma ([place]) right -sided w/ right VI palsy   followed by dr Gaynell Face  . Mitral regurgitation    a. Mild-mod by echo 12/2014.  [name] Myocardial infarction [facility]) 1980's- medical intervention   "so mild I didn't know I'd had it"  . PAF (paroxysmal atrial fibrillation) ([facility])    not on coumadin due to hx of GI bleed.  .  Prostate cancer ([place]) 11/30/13   Gleason 8, volume 22.14 cc  . Sleep apnea    non-compliant cpap    Past Surgical History:  Procedure Laterality Date  . APPENDECTOMY    . CARDIAC CATHETERIZATION  2007   noncritical cad (results w/ chart)  . CARDIOVERSION N/A 10/13/2015   Procedure: CARDIOVERSION;  Surgeon: Josue Hector, MD;  Location: [place];  Service: Cardiovascular;  Laterality: N/A;  . CATARACT EXTRACTION W/ INTRAOCULAR LENS  IMPLANT, BILATERAL Bilateral ~ 2000  . CHOLECYSTECTOMY    . CLOSED REDUCTION HIP DISLOCATION Right "several"  . CORONARY ANGIOPLASTY WITH STENT PLACEMENT  08-03-08   drug-eluting stent x2 distal and mid lad  . EP IMPLANTABLE DEVICE N/A 01/31/2015   Procedure: BiV Pacemaker Insertion CRT-P;  Surgeon: Evans Lance, MD;  Location: [place] CV LAB;  Service: Cardiovascular;  Laterality: N/A;  . EP IMPLANTABLE DEVICE N/A 02/07/2015   Procedure: Lead Revision/Repair;  Surgeon: Deboraha Sprang, MD;  Location: [place] CV LAB;  Service: Cardiovascular;  Laterality: N/A;  . ESOPHAGOGASTRODUODENOSCOPY N/A 02/11/2014   Procedure: ESOPHAGOGASTRODUODENOSCOPY (EGD);  Surgeon: Cleotis Nipper, MD;  Location[facility] ENDOSCOPY;  Service: Endoscopy;  Laterality: N/A;  . gamma knife radiation  2000   [facility] for meningioma, last eval 2013- no change  . INGUINAL HERNIA REPAIR Bilateral   . INNER EAR SURGERY Right yrs ago   "trying to get my hearing back  . LEFT HEART CATHETERIZATION WITH CORONARY ANGIOGRAM N/A 09/04/2013   Procedure: LEFT HEART CATHETERIZATION WITH CORONARY ANGIOGRAM;  Surgeon: Jettie Booze, MD;  Location: [facility] CATH LAB;  Service: Cardiovascular;  Laterality: N/A;  . PENILE PROSTHESIS IMPLANT  1990's  . PERCUTANEOUS CORONARY ROTOBLATOR INTERVENTION (PCI-R) N/A 09/08/2013   Procedure: PERCUTANEOUS CORONARY ROTOBLATOR INTERVENTION (PCI-R);  Surgeon: Jettie Booze, MD;  Location: [facility] C[facility] LAB;  Service: Cardiovascular;  Laterality: N/A;  . PROSTATE  BIOPSY  11/30/13   Gleason 8, vol 22.14 cc  . REVISION TOTAL KNEE ARTHROPLASTY Right   . SHOULDER OPEN ROTATOR CUFF REPAIR Left   . TOTAL HIP ARTHROPLASTY Right 03-25-08--  . TOTAL HIP ARTHROPLASTY Left 2005  . TOTAL HIP REVISION Right 3-4 times  . TOTAL KNEE ARTHROPLASTY Right 2004  . TOTAL KNEE ARTHROPLASTY Left 1997  . TRANSURETHRAL RESECTION OF PROSTATE  "years ago"  . TRANSURETHRAL RESECTION OF PROSTATE  01/08/2011   Procedure: TRANSURETHRAL RESECTION OF THE PROSTATE (TURP);  Surgeon: Franchot Gallo;  Location: [place];  Service: Urology;  Laterality: N/A;  GYRUS      Current Outpatient Medications  Medication Sig Dispense Refill  . acetaminophen (TYLENOL) 500 MG  tablet Take 1,000 mg by mouth every 6 (six) hours as needed for headache (pain).     . brimonidine (ALPHAGAN) 0.15 % ophthalmic solution Place 1 drop into both eyes 2 (two) times daily.    . clopidogrel (PLAVIX) 75 MG tablet TAKE 1 TABLET EVERY DAY 90 tablet 2  . diazepam (VALIUM) 5 MG tablet Take 1 tablet (5 mg total) by mouth every 12 (twelve) hours as needed for anxiety. 5 tablet 0  . dorzolamide-timolol (COSOPT) 22.3-6.8 MG/ML ophthalmic solution Place 1 drop into both eyes 2 (two) times daily.   2  . finasteride (PROSCAR) 5 MG tablet Take 1 tablet (5 mg total) by mouth daily. 30 tablet 6  . fluticasone (FLONASE) 50 MCG/ACT nasal spray Place 2 sprays into both nostrils daily as needed for allergies.   1  . methylPREDNISolone (MEDROL DOSEPAK) 4 MG TBPK tablet Take 4 mg by mouth as directed. Started on 08-16-18 DS 6    . morphine (MSIR) 15 MG tablet Take 0.5 tablets (7.5 mg total) by mouth every 4 (four) hours as needed for severe pain. 2 tablet 0  . Multiple Vitamins-Minerals (EMERGEN-C VITAMIN C) PACK Take 1,000 mg by mouth daily after lunch.    . Multiple Vitamins-Minerals (PRESERVISION AREDS 2) CAPS Take 1 capsule by mouth daily after lunch.    . nitroGLYCERIN (NITROSTAT) 0.4 MG SL tablet Place 1  tablet (0.4 mg total) under the tongue every 5 (five) minutes as needed for chest pain. 25 tablet 5  . Omega-3 Fatty Acids (FISH OIL PO) Take 1 capsule by mouth daily after lunch.    . potassium chloride (K-DUR,KLOR-CON) 10 MEQ tablet TAKE 1 TABLET EVERY DAY AS NEEDED. WHEN YOU TAKE YOUR FUROSEMIDE. (Patient taking differently: Take 10 mEq by mouth daily as needed (taking with furosemide). ) 90 tablet 2  . Probiotic Product (PROBIOTIC PO) Take 1 tablet by mouth 2 (two) times daily.     . psyllium (METAMUCIL) 58.6 % packet Take 1 packet by mouth daily with supper. Mix in 8 oz liquid and drink    . tamsulosin (FLOMAX) 0.4 MG CAPS capsule Take 0.4 mg by mouth daily.    [name] torsemide (DEMADEX) 20 MG tablet Take one extra tablet of torsemide by mouth if weight gain of 3 pounds 30 tablet 3  . traMADol (ULTRAM) 50 MG tablet Take 50 mg as needed by mouth.     No current facility-administered medications for this visit.    Allergies:   Codeine, Omeprazole, Pantoprazole, Pantoprazole sodium, and Warfarin sodium    Social History:  The patient  reports that he quit smoking about 60 years ago. His smoking use included cigarettes. He has a 14.00 pack-year smoking history. He has never used smokeless tobacco. He reports current alcohol use. He reports that he does not use drugs.   Family History:  The patient's family history includes Cancer in his brother and another family member; Emphysema in his father; Heart attack in his mother; Heart disease in his mother.    ROS:  Please see the history of present illness.   Otherwise, review of systems are positive for leg edema.   All other systems are reviewed and negative.    PHYSICAL EXAM: VS:  BP 118/64   Pulse (!) 55   Ht 5\' 7"  (1.702 m)   Wt 173 lb (78.5 kg)   BMI 27.10 kg/m  , BMI Body mass index is 27.1 kg/m. GEN: Well nourished, well developed, in no acute distress  HEENT:  normal  Neck: no JVD, carotid bruits, or masses Cardiac: irregularly  irregular; no murmurs, rubs, or gallops,no edema  Respiratory:  clear to auscultation bilaterally, normal work of breathing GI: soft, nontender, nondistended, + BS MS: no deformity or atrophy  Skin: warm and dry, no rash Neuro:  Strength and sensation are intact Psych: euthymic mood, full affect   EKG:   The ekg ordered 08/2018 demonstrates intermittent pacing   Recent Labs: 09/09/2018: ALT 20; BUN 18; Creatinine, Ser 1.52; Hemoglobin 14.5; Platelets 144; Potassium 4.2; Sodium 130   Lipid Panel    Component Value Date/Time   CHOL 157 12/31/2016 1012   TRIG 145 12/31/2016 1012   HDL 35 (L) 12/31/2016 1012   CHOLHDL 4.5 12/31/2016 1012   LDLCALC 93 12/31/2016 1012     Other studies Reviewed: Additional studies/ records that were reviewed today with results demonstrating: 2020 echo:The left ventricle has normal systolic function, with an ejection fraction of 55-60%. The cavity size was normal. Left ventricular diastolic function could not be evaluated secondary to atrial fibrillation.  2. The right ventricle has normal systolic function. The cavity was moderately enlarged. There is no increase in right ventricular wall thickness.  3. Left atrial size was severely dilated.  4. Right atrial size was moderately dilated.  5. Mitral valve regurgitation is mild to moderate by color flow Doppler. The MR jet is eccentric anteriorly directed.  6. The aortic valve is tricuspid. Mild sclerosis of the aortic valve.  7. The underlying heart rhythm was very irregular and could be atrial fibrillation..   ASSESSMENT AND PLAN:  1. CAD: No angina.  Continue aggressive secondary prevention.    2. AFib/flutter:  Rate controlled.  No anticoagulation given bleeding risks.  COntinue clopidogrel. 3. Moderate mitral regurgitation: unchanged by June 2020 echo.  No CHF.  Cr. 1.5.  Recheck given diuretic use. 4. HTN: The current medical regimen is effective;  continue present plan and medications. 5. Pacer:  Followed by Dr. Lovena Le. 6. Fatigue: Likely age related .  Will check TSH.    Current medicines are reviewed at length with the patient today.  The patient concerns regarding his medicines were addressed.  The following changes have been made:  No change  Labs/ tests ordered today include:  No orders of the defined types were placed in this encounter.   Recommend 150 minutes/week of aerobic exercise Low fat, low carb, high fiber diet recommended  Disposition:   FU in 6 months   Signed, Larae Grooms, MD  02/06/2019 10:11 AM    [facility] [address], [place], [place]  [address] Phone: [phone]; Fax: [phone]

## 2019-02-06 NOTE — Patient Instructions (Signed)
Medication Instructions:  Your physician recommends that you continue on your current medications as directed. Please refer to the Current Medication list given to you today.  *If you need a refill on your cardiac medications before your next appointment, please call your pharmacy*  Lab Work: TODAY: CMET, CBC, TSH  If you have labs (blood work) drawn today and your tests are completely normal, you will receive your results only by: [name] MyChart Message (if you have MyChart) OR . A paper copy in the mail If you have any lab test that is abnormal or we need to change your treatment, we will call you to review the results.  Testing/Procedures: None ordered  Follow-Up: At [facility], you and your health needs are our priority.  As part of our continuing mission to provide you with exceptional heart care, we have created designated Provider Care Teams.  These Care Teams include your primary Cardiologist (physician) and Advanced Practice Providers (APPs -  Physician Assistants and Nurse Practitioners) who all work together to provide you with the care you need, when you need it.  Your next appointment:   8 month(s)  The format for your next appointment:   In Person  Provider:   You may see Larae Grooms, MD or one of the following Advanced Practice Providers on your designated Care Team:    Melina Copa, PA-C  Ermalinda Barrios, PA-C   Other Instructions

## 2019-02-09 ENCOUNTER — Telehealth: Payer: Self-pay | Admitting: Interventional Cardiology

## 2019-02-09 DIAGNOSIS — M25522 Pain in left elbow: Secondary | ICD-10-CM | POA: Diagnosis not present

## 2019-02-09 DIAGNOSIS — M5416 Radiculopathy, lumbar region: Secondary | ICD-10-CM | POA: Diagnosis not present

## 2019-02-09 NOTE — Telephone Encounter (Signed)
-----   Message from Jettie Booze, MD sent at 02/06/2019  6:51 PM EST ----- Labs stable.  COntinue current meds.

## 2019-02-09 NOTE — Telephone Encounter (Signed)
Patient returning Brittany's call in regards to lab results.

## 2019-02-09 NOTE — Telephone Encounter (Signed)
The patient has been notified of the result and verbalized understanding.  All questions (if any) were answered. Cleon Gustin, RN 02/09/2019 3:24 PM

## 2019-02-12 DIAGNOSIS — Z79891 Long term (current) use of opiate analgesic: Secondary | ICD-10-CM | POA: Diagnosis not present

## 2019-02-12 DIAGNOSIS — M5416 Radiculopathy, lumbar region: Secondary | ICD-10-CM | POA: Diagnosis not present

## 2019-02-12 DIAGNOSIS — Z9181 History of falling: Secondary | ICD-10-CM | POA: Diagnosis not present

## 2019-02-12 DIAGNOSIS — Z7952 Long term (current) use of systemic steroids: Secondary | ICD-10-CM | POA: Diagnosis not present

## 2019-02-12 DIAGNOSIS — Z7902 Long term (current) use of antithrombotics/antiplatelets: Secondary | ICD-10-CM | POA: Diagnosis not present

## 2019-02-19 DIAGNOSIS — Z79891 Long term (current) use of opiate analgesic: Secondary | ICD-10-CM | POA: Diagnosis not present

## 2019-02-19 DIAGNOSIS — Z7902 Long term (current) use of antithrombotics/antiplatelets: Secondary | ICD-10-CM | POA: Diagnosis not present

## 2019-02-19 DIAGNOSIS — M5416 Radiculopathy, lumbar region: Secondary | ICD-10-CM | POA: Diagnosis not present

## 2019-02-19 DIAGNOSIS — Z7952 Long term (current) use of systemic steroids: Secondary | ICD-10-CM | POA: Diagnosis not present

## 2019-02-19 DIAGNOSIS — Z9181 History of falling: Secondary | ICD-10-CM | POA: Diagnosis not present

## 2019-02-24 DIAGNOSIS — M5136 Other intervertebral disc degeneration, lumbar region: Secondary | ICD-10-CM | POA: Diagnosis not present

## 2019-02-24 DIAGNOSIS — R69 Illness, unspecified: Secondary | ICD-10-CM | POA: Diagnosis not present

## 2019-02-25 DIAGNOSIS — Z9181 History of falling: Secondary | ICD-10-CM | POA: Diagnosis not present

## 2019-02-25 DIAGNOSIS — Z7952 Long term (current) use of systemic steroids: Secondary | ICD-10-CM | POA: Diagnosis not present

## 2019-02-25 DIAGNOSIS — Z7902 Long term (current) use of antithrombotics/antiplatelets: Secondary | ICD-10-CM | POA: Diagnosis not present

## 2019-02-25 DIAGNOSIS — M5416 Radiculopathy, lumbar region: Secondary | ICD-10-CM | POA: Diagnosis not present

## 2019-02-25 NOTE — Progress Notes (Signed)
No ICM remote transmission received for 02/23/2019 and next ICM transmission scheduled for 03/23/2019.   

## 2019-02-27 DIAGNOSIS — Z9181 History of falling: Secondary | ICD-10-CM | POA: Diagnosis not present

## 2019-02-27 DIAGNOSIS — M5416 Radiculopathy, lumbar region: Secondary | ICD-10-CM | POA: Diagnosis not present

## 2019-02-27 DIAGNOSIS — Z7902 Long term (current) use of antithrombotics/antiplatelets: Secondary | ICD-10-CM | POA: Diagnosis not present

## 2019-02-27 DIAGNOSIS — Z7952 Long term (current) use of systemic steroids: Secondary | ICD-10-CM | POA: Diagnosis not present

## 2019-03-03 DIAGNOSIS — M5416 Radiculopathy, lumbar region: Secondary | ICD-10-CM | POA: Diagnosis not present

## 2019-03-03 DIAGNOSIS — Z7902 Long term (current) use of antithrombotics/antiplatelets: Secondary | ICD-10-CM | POA: Diagnosis not present

## 2019-03-03 DIAGNOSIS — Z9181 History of falling: Secondary | ICD-10-CM | POA: Diagnosis not present

## 2019-03-03 DIAGNOSIS — Z7952 Long term (current) use of systemic steroids: Secondary | ICD-10-CM | POA: Diagnosis not present

## 2019-03-06 DIAGNOSIS — Z20822 Contact with and (suspected) exposure to covid-19: Secondary | ICD-10-CM | POA: Diagnosis not present

## 2019-03-06 DIAGNOSIS — R0981 Nasal congestion: Secondary | ICD-10-CM | POA: Diagnosis not present

## 2019-03-09 DIAGNOSIS — Z9181 History of falling: Secondary | ICD-10-CM | POA: Diagnosis not present

## 2019-03-09 DIAGNOSIS — Z7952 Long term (current) use of systemic steroids: Secondary | ICD-10-CM | POA: Diagnosis not present

## 2019-03-09 DIAGNOSIS — M5416 Radiculopathy, lumbar region: Secondary | ICD-10-CM | POA: Diagnosis not present

## 2019-03-09 DIAGNOSIS — Z7902 Long term (current) use of antithrombotics/antiplatelets: Secondary | ICD-10-CM | POA: Diagnosis not present

## 2019-03-16 DIAGNOSIS — M5136 Other intervertebral disc degeneration, lumbar region: Secondary | ICD-10-CM | POA: Diagnosis not present

## 2019-03-16 DIAGNOSIS — M545 Low back pain: Secondary | ICD-10-CM | POA: Diagnosis not present

## 2019-03-17 ENCOUNTER — Telehealth: Payer: Self-pay | Admitting: Interventional Cardiology

## 2019-03-17 NOTE — Telephone Encounter (Signed)
We are recommending the COVID-19 vaccine to all of our patients. Cardiac medications (including blood thinners) should not deter anyone from being vaccinated and there is no need to hold any of those medications prior to vaccine administration.     Currently, there is a hotline to call (active 03/06/19) to schedule vaccination appointments as no walk-ins will be accepted.   Number: [phone].    If an appointment is not available please go to [email or web address] to sign up for notification when additional vaccine appointments are available.   If you have further questions or concerns about the vaccine process, please visit [email or web address] or contact your primary care physician.   

## 2019-03-18 DIAGNOSIS — Z7902 Long term (current) use of antithrombotics/antiplatelets: Secondary | ICD-10-CM | POA: Diagnosis not present

## 2019-03-18 DIAGNOSIS — Z9181 History of falling: Secondary | ICD-10-CM | POA: Diagnosis not present

## 2019-03-18 DIAGNOSIS — Z7952 Long term (current) use of systemic steroids: Secondary | ICD-10-CM | POA: Diagnosis not present

## 2019-03-18 DIAGNOSIS — M5416 Radiculopathy, lumbar region: Secondary | ICD-10-CM | POA: Diagnosis not present

## 2019-03-23 DIAGNOSIS — H348312 Tributary (branch) retinal vein occlusion, right eye, stable: Secondary | ICD-10-CM | POA: Diagnosis not present

## 2019-03-23 DIAGNOSIS — H532 Diplopia: Secondary | ICD-10-CM | POA: Diagnosis not present

## 2019-03-23 DIAGNOSIS — H401123 Primary open-angle glaucoma, left eye, severe stage: Secondary | ICD-10-CM | POA: Diagnosis not present

## 2019-03-23 DIAGNOSIS — H02831 Dermatochalasis of right upper eyelid: Secondary | ICD-10-CM | POA: Diagnosis not present

## 2019-03-23 DIAGNOSIS — H02834 Dermatochalasis of left upper eyelid: Secondary | ICD-10-CM | POA: Diagnosis not present

## 2019-03-23 DIAGNOSIS — H0102A Squamous blepharitis right eye, upper and lower eyelids: Secondary | ICD-10-CM | POA: Diagnosis not present

## 2019-03-23 DIAGNOSIS — H401112 Primary open-angle glaucoma, right eye, moderate stage: Secondary | ICD-10-CM | POA: Diagnosis not present

## 2019-03-23 DIAGNOSIS — H353131 Nonexudative age-related macular degeneration, bilateral, early dry stage: Secondary | ICD-10-CM | POA: Diagnosis not present

## 2019-03-23 DIAGNOSIS — H43813 Vitreous degeneration, bilateral: Secondary | ICD-10-CM | POA: Diagnosis not present

## 2019-03-24 ENCOUNTER — Telehealth: Payer: Self-pay

## 2019-03-24 NOTE — Telephone Encounter (Signed)
Left message for patient to remind of missed remote transmission.  

## 2019-03-26 DIAGNOSIS — Z9181 History of falling: Secondary | ICD-10-CM | POA: Diagnosis not present

## 2019-03-26 DIAGNOSIS — Z7902 Long term (current) use of antithrombotics/antiplatelets: Secondary | ICD-10-CM | POA: Diagnosis not present

## 2019-03-26 DIAGNOSIS — Z7952 Long term (current) use of systemic steroids: Secondary | ICD-10-CM | POA: Diagnosis not present

## 2019-03-26 DIAGNOSIS — M5416 Radiculopathy, lumbar region: Secondary | ICD-10-CM | POA: Diagnosis not present

## 2019-03-30 NOTE — Progress Notes (Signed)
No ICM remote transmission received for 03/23/2019 and next ICM transmission scheduled for 04/14/2019.

## 2019-04-01 DIAGNOSIS — M5416 Radiculopathy, lumbar region: Secondary | ICD-10-CM | POA: Diagnosis not present

## 2019-04-01 DIAGNOSIS — Z7902 Long term (current) use of antithrombotics/antiplatelets: Secondary | ICD-10-CM | POA: Diagnosis not present

## 2019-04-01 DIAGNOSIS — Z9181 History of falling: Secondary | ICD-10-CM | POA: Diagnosis not present

## 2019-04-01 DIAGNOSIS — Z7952 Long term (current) use of systemic steroids: Secondary | ICD-10-CM | POA: Diagnosis not present

## 2019-04-10 DIAGNOSIS — Z9181 History of falling: Secondary | ICD-10-CM | POA: Diagnosis not present

## 2019-04-10 DIAGNOSIS — Z7952 Long term (current) use of systemic steroids: Secondary | ICD-10-CM | POA: Diagnosis not present

## 2019-04-10 DIAGNOSIS — Z7902 Long term (current) use of antithrombotics/antiplatelets: Secondary | ICD-10-CM | POA: Diagnosis not present

## 2019-04-10 DIAGNOSIS — M5416 Radiculopathy, lumbar region: Secondary | ICD-10-CM | POA: Diagnosis not present

## 2019-04-14 ENCOUNTER — Ambulatory Visit (INDEPENDENT_AMBULATORY_CARE_PROVIDER_SITE_OTHER): Payer: Medicare HMO

## 2019-04-14 DIAGNOSIS — I5022 Chronic systolic (congestive) heart failure: Secondary | ICD-10-CM | POA: Diagnosis not present

## 2019-04-14 DIAGNOSIS — Z950 Presence of cardiac pacemaker: Secondary | ICD-10-CM | POA: Diagnosis not present

## 2019-04-18 DIAGNOSIS — R69 Illness, unspecified: Secondary | ICD-10-CM | POA: Diagnosis not present

## 2019-04-21 DIAGNOSIS — Z7902 Long term (current) use of antithrombotics/antiplatelets: Secondary | ICD-10-CM | POA: Diagnosis not present

## 2019-04-21 DIAGNOSIS — Z7952 Long term (current) use of systemic steroids: Secondary | ICD-10-CM | POA: Diagnosis not present

## 2019-04-21 DIAGNOSIS — M5416 Radiculopathy, lumbar region: Secondary | ICD-10-CM | POA: Diagnosis not present

## 2019-04-21 DIAGNOSIS — Z9181 History of falling: Secondary | ICD-10-CM | POA: Diagnosis not present

## 2019-04-21 NOTE — Progress Notes (Signed)
EPIC Encounter for ICM Monitoring  Patient Name: [name] is a 86 y.o. male Date: 04/21/2019 Primary Care Physican: Lawerance Cruel, MD Primary Cardiologist:Varanasi  Electrophysiologist:Taylor Last Weight:176 - 180 lbs   Spoke with patient and caregiver Helene Kelp (pt hard of hearing).  He is doing well with exception of difficulty sleeping at night.  Denies fluid symptoms.   CorVuethoracic impedancenormal.  Prescribed:Torsemide 20 mg 1 tablet daily. Take 1 extra Torsemide tabletif needed.  Labs: 02/06/2019 Creatinine1.57, Connye Burkitt [id number], [id number] 09/09/2018 Creatinine1.52, BUN18, Potassium4.2, Sodium130, [id number] 08/19/2018 Creatinine1.32, BUN20, Potassium3.8, [id number], [id number] A complete set of results can be found in Results Review.  Recommendations: No changes and encouraged to call if experiencing any fluid symptoms.  Advised to update DPR at 3/4 office visit.  Follow-up plan: ICM clinic phone appointment on3/22/2021. 91 day device clinic remote transmission 07/13/2019. Office appt 04/30/2019 with Dr.Taylor.   Copy of ICM check sent to Dr.Taylor.   3 month ICM trend: 04/14/2019    1 Year ICM trend:       Rosalene Billings, RN 04/21/2019 8:46 AM

## 2019-04-28 DIAGNOSIS — R8279 Other abnormal findings on microbiological examination of urine: Secondary | ICD-10-CM | POA: Diagnosis not present

## 2019-04-28 DIAGNOSIS — N3000 Acute cystitis without hematuria: Secondary | ICD-10-CM | POA: Diagnosis not present

## 2019-04-28 DIAGNOSIS — R8271 Bacteriuria: Secondary | ICD-10-CM | POA: Diagnosis not present

## 2019-04-30 ENCOUNTER — Encounter: Payer: Self-pay | Admitting: Internal Medicine

## 2019-04-30 ENCOUNTER — Other Ambulatory Visit: Payer: Self-pay

## 2019-04-30 ENCOUNTER — Ambulatory Visit: Payer: Medicare HMO | Admitting: Internal Medicine

## 2019-04-30 VITALS — BP 114/68 | HR 81 | Ht 67.0 in | Wt 180.4 lb

## 2019-04-30 DIAGNOSIS — Z950 Presence of cardiac pacemaker: Secondary | ICD-10-CM

## 2019-04-30 DIAGNOSIS — M5416 Radiculopathy, lumbar region: Secondary | ICD-10-CM | POA: Diagnosis not present

## 2019-04-30 DIAGNOSIS — I5022 Chronic systolic (congestive) heart failure: Secondary | ICD-10-CM | POA: Diagnosis not present

## 2019-04-30 DIAGNOSIS — I5043 Acute on chronic combined systolic (congestive) and diastolic (congestive) heart failure: Secondary | ICD-10-CM | POA: Diagnosis not present

## 2019-04-30 DIAGNOSIS — I4821 Permanent atrial fibrillation: Secondary | ICD-10-CM

## 2019-04-30 DIAGNOSIS — Z7902 Long term (current) use of antithrombotics/antiplatelets: Secondary | ICD-10-CM | POA: Diagnosis not present

## 2019-04-30 DIAGNOSIS — Z7952 Long term (current) use of systemic steroids: Secondary | ICD-10-CM | POA: Diagnosis not present

## 2019-04-30 DIAGNOSIS — Z9181 History of falling: Secondary | ICD-10-CM | POA: Diagnosis not present

## 2019-04-30 NOTE — Patient Instructions (Signed)
Medication Instructions:  Your physician recommends that you continue on your current medications as directed. Please refer to the Current Medication list given to you today.  Labwork: None ordered.  Testing/Procedures: None ordered.  Follow-Up: Your physician wants you to follow-up in: one year with Dr. Lovena Le.  You will receive a reminder letter in the mail two months in advance. If you don't receive a letter, please call our office to schedule the follow-up appointment.  Remote monitoring is used to monitor your Pacemaker from home. This monitoring reduces the number of office visits required to check your device to one time per year. It allows [place] to keep an eye on the functioning of your device to ensure it is working properly. You are scheduled for a device check from home on 05/18/2019. You may send your transmission at any time that day. If you have a wireless device, the transmission will be sent automatically. After your physician reviews your transmission, you will receive a postcard with your next transmission date.  Any Other Special Instructions Will Be Listed Below (If Applicable).  If you need a refill on your cardiac medications before your next appointment, please call your pharmacy.

## 2019-04-30 NOTE — Progress Notes (Signed)
HPI [name] returns today for followup. He is a pleasant elderly man with symptomatic bradycardia, chronic systolic heart failure with near normalization of his LV function, lung disease, HTN, and is s/p Biv PPM insertion. In the interim, he has been stable.  He denies chest pain. His chf symptoms are class 2. No syncope.He has been vaccinated from Covid Allergies  Allergen Reactions  . Codeine Anxiety and Other (See Comments)    Insomnia/ hyper Insomnia/ hyper  . Omeprazole Nausea And Vomiting and Other (See Comments)    GI upset, insomnia  GI upset, insomnia GI upset, insomnia GI upset, insomnia  . Pantoprazole Nausea Only and Other (See Comments)    Gi upset, insomnia  . Pantoprazole Sodium Nausea Only and Other (See Comments)    Gi upset, insomnia   . Warfarin Sodium Anxiety and Other (See Comments)    Hx gi bleed  Hx gi bleed Hx gi bleed     Current Outpatient Medications  Medication Sig Dispense Refill  . acetaminophen (TYLENOL) 500 MG tablet Take 1,000 mg by mouth every 6 (six) hours as needed for headache (pain).     . brimonidine (ALPHAGAN) 0.15 % ophthalmic solution Place 1 drop into both eyes 2 (two) times daily.    . clopidogrel (PLAVIX) 75 MG tablet TAKE 1 TABLET EVERY DAY 90 tablet 2  . diazepam (VALIUM) 5 MG tablet Take 1 tablet (5 mg total) by mouth every 12 (twelve) hours as needed for anxiety. 5 tablet 0  . dorzolamide-timolol (COSOPT) 22.3-6.8 MG/ML ophthalmic solution Place 1 drop into both eyes 2 (two) times daily.   2  . Multiple Vitamins-Minerals (PRESERVISION AREDS 2) CAPS Take 1 capsule by mouth daily after lunch.    . nitroGLYCERIN (NITROSTAT) 0.4 MG SL tablet Place 1 tablet (0.4 mg total) under the tongue every 5 (five) minutes as needed for chest pain. 25 tablet 5  . Omega-3 Fatty Acids (FISH OIL PO) Take 1 capsule by mouth daily after lunch.    . pantoprazole (PROTONIX) 40 MG tablet Take 1 tablet by mouth daily.    . phenazopyridine  (PYRIDIUM) 100 MG tablet Take 100 mg by mouth as needed for pain.    . potassium chloride (K-DUR,KLOR-CON) 10 MEQ tablet TAKE 1 TABLET EVERY DAY AS NEEDED. WHEN YOU TAKE YOUR FUROSEMIDE. 90 tablet 2  . Probiotic Product (PROBIOTIC PO) Take 1 tablet by mouth 2 (two) times daily.     . psyllium (METAMUCIL) 58.6 % packet Take 1 packet by mouth daily with supper. Mix in 8 oz liquid and drink    . tamsulosin (FLOMAX) 0.4 MG CAPS capsule Take 0.4 mg by mouth daily.    [name] torsemide (DEMADEX) 20 MG tablet Take one extra tablet of torsemide by mouth if weight gain of 3 pounds 30 tablet 3  . traMADol (ULTRAM) 50 MG tablet Take 50 mg as needed by mouth.    . traZODone (DESYREL) 50 MG tablet Take 50 mg by mouth as needed for sleep.     No current facility-administered medications for this visit.     Past Medical History:  Diagnosis Date  . Anxiety   . Arthritis   . Atrial fibrillation, permanent ([place]) 01/07/2016  . Blood transfusion    "related to a surgery"  . BPH (benign prostatic hypertrophy) with urinary obstruction    s/p turp yrs ago  . Bradycardia    a. Amio d/c'd 08/2013; brady arrest 08/2013 after PCI >>> recurrent AF >>>  Amiodarone restarted. b. Pacemaker being considered in 11/2014.  [name] CAD (coronary artery disease)    a. s/p MI and prior PCI of LAD;  b. LHC (08/2013):  prox LAD 60-70%, mid LAD stents ok, ostial lesion at Dx jailed by stent, mild CFX and RCA disesase >>>  PCI (09/08/13):  rotational atherectomy + Promus DES to prox LAD  . Cardiomyopathy ([place])    a. Echo (08/2013):  EF 30-35%, AS hypokinesis, Gr 1 diast dysfn, mild MR, mild LAE >>> b. improved EF 50-55% by echo 8/15. c. EF down again by echo 12/2014 to 30-35% but 51% by nuc.  [name] Chronic lower back pain   . Chronic systolic CHF (congestive heart failure) ([place])   . CKD (chronic kidney disease), stage III    a. Per review of labs baseline Cr 1.1-1.3.  [name] DDD (degenerative disc disease)    chronic back pain  . Depression   .  Diverticulitis of colon with bleeding    s/p sigmoid resection '88  . DJD (degenerative joint disease) hips and knees   s/p bilateral total replacements  . GERD (gastroesophageal reflux disease)    occ. take prevacid  . History of GI diverticular bleed april 2012   transfused blood and resolved without surgical intervention  . Hypertension   . Impaired hearing bilateral    only left hearing aid  . Impotence, organic    s/p penile prosthesis 1990's  . Incomplete bladder emptying   . LBBB (left bundle branch block)   . Meningioma ([place]) right -sided w/ right VI palsy   followed by dr Gaynell Face  . Mitral regurgitation    a. Mild-mod by echo 12/2014.  [name] Myocardial infarction [facility]) 1980's- medical intervention   "so mild I didn't know I'd had it"  . PAF (paroxysmal atrial fibrillation) ([facility])    not on coumadin due to hx of GI bleed.  . Prostate cancer ([place]) 11/30/13   Gleason 8, volume 22.14 cc  . Sleep apnea    non-compliant cpap    ROS:   All systems reviewed and negative except as noted in the HPI.   Past Surgical History:  Procedure Laterality Date  . APPENDECTOMY    . CARDIAC CATHETERIZATION  2007   noncritical cad (results w/ chart)  . CARDIOVERSION N/A 10/13/2015   Procedure: CARDIOVERSION;  Surgeon: Josue Hector, MD;  Location: [place];  Service: Cardiovascular;  Laterality: N/A;  . CATARACT EXTRACTION W/ INTRAOCULAR LENS  IMPLANT, BILATERAL Bilateral ~ 2000  . CHOLECYSTECTOMY    . CLOSED REDUCTION HIP DISLOCATION Right "several"  . CORONARY ANGIOPLASTY WITH STENT PLACEMENT  08-03-08   drug-eluting stent x2 distal and mid lad  . EP IMPLANTABLE DEVICE N/A 01/31/2015   Procedure: BiV Pacemaker Insertion CRT-P;  Surgeon: Evans Lance, MD;  Location: [place] CV LAB;  Service: Cardiovascular;  Laterality: N/A;  . EP IMPLANTABLE DEVICE N/A 02/07/2015   Procedure: Lead Revision/Repair;  Surgeon: Deboraha Sprang, MD;  Location: [place] CV LAB;  Service:  Cardiovascular;  Laterality: N/A;  . ESOPHAGOGASTRODUODENOSCOPY N/A 02/11/2014   Procedure: ESOPHAGOGASTRODUODENOSCOPY (EGD);  Surgeon: Cleotis Nipper, MD;  Location[facility] ENDOSCOPY;  Service: Endoscopy;  Laterality: N/A;  . gamma knife radiation  2000   [facility] for meningioma, last eval 2013- no change  . INGUINAL HERNIA REPAIR Bilateral   . INNER EAR SURGERY Right yrs ago   "trying to get my hearing back  . LEFT HEART CATHETERIZATION WITH CORONARY ANGIOGRAM N/A 09/04/2013  Procedure: LEFT HEART CATHETERIZATION WITH CORONARY ANGIOGRAM;  Surgeon: Jettie Booze, MD;  Location: [facility] CATH LAB;  Service: Cardiovascular;  Laterality: N/A;  . PENILE PROSTHESIS IMPLANT  1990's  . PERCUTANEOUS CORONARY ROTOBLATOR INTERVENTION (PCI-R) N/A 09/08/2013   Procedure: PERCUTANEOUS CORONARY ROTOBLATOR INTERVENTION (PCI-R);  Surgeon: Jettie Booze, MD;  Location: [facility] CATH LAB;  Service: Cardiovascular;  Laterality: N/A;  . PROSTATE BIOPSY  11/30/13   Gleason 8, vol 22.14 cc  . REVISION TOTAL KNEE ARTHROPLASTY Right   . SHOULDER OPEN ROTATOR CUFF REPAIR Left   . TOTAL HIP ARTHROPLASTY Right 03-25-08--  . TOTAL HIP ARTHROPLASTY Left 2005  . TOTAL HIP REVISION Right 3-4 times  . TOTAL KNEE ARTHROPLASTY Right 2004  . TOTAL KNEE ARTHROPLASTY Left 1997  . TRANSURETHRAL RESECTION OF PROSTATE  "years ago"  . TRANSURETHRAL RESECTION OF PROSTATE  01/08/2011   Procedure: TRANSURETHRAL RESECTION OF THE PROSTATE (TURP);  Surgeon: Franchot Gallo;  Location: [place];  Service: Urology;  Laterality: N/A;  GYRUS      Family History  Problem Relation Age of Onset  . Heart disease Mother   . Heart attack Mother   . Emphysema Father   . Cancer Brother        liver cancer  . Cancer Other      Social History   Socioeconomic History  . Marital status: Widowed    Spouse name: Not on file  . Number of children: Not on file  . Years of education: Not on file  . Highest education level:  Not on file  Occupational History  . Not on file  Tobacco Use  . Smoking status: Former Smoker    Packs/day: 1.00    Years: 14.00    Pack years: 14.00    Types: Cigarettes    Quit date: 01/05/1959    Years since quitting: 60.3  . Smokeless tobacco: Never Used  Substance and Sexual Activity  . Alcohol use: Yes    Comment: 02/09/2014 "I'm not drinking anymore"  . Drug use: No  . Sexual activity: Never  Other Topics Concern  . Not on file  Social History Narrative  . Not on file   Social Determinants of Health   Financial Resource Strain:   . Difficulty of Paying Living Expenses: Not on file  Food Insecurity:   . Worried About Charity fundraiser in the Last Year: Not on file  . Ran Out of Food in the Last Year: Not on file  Transportation Needs:   . Lack of Transportation (Medical): Not on file  . Lack of Transportation (Non-Medical): Not on file  Physical Activity:   . Days of Exercise per Week: Not on file  . Minutes of Exercise per Session: Not on file  Stress:   . Feeling of Stress : Not on file  Social Connections:   . Frequency of Communication with Friends and Family: Not on file  . Frequency of Social Gatherings with Friends and Family: Not on file  . Attends Religious Services: Not on file  . Active Member of Clubs or Organizations: Not on file  . Attends Archivist Meetings: Not on file  . Marital Status: Not on file  Intimate Partner Violence:   . Fear of Current or Ex-Partner: Not on file  . Emotionally Abused: Not on file  . Physically Abused: Not on file  . Sexually Abused: Not on file     BP 114/68   Pulse 81  Ht 5\' 7"  (1.702 m)   Wt 180 lb 6.4 oz (81.8 kg)   SpO2 97%   BMI 28.25 kg/m   Physical Exam:  Well appearing 86 yo man, NAD HEENT: Unremarkable Neck:  No JVD, no thyromegally Lymphatics:  No adenopathy Back:  No CVA tenderness Lungs:  Clear with no wheezes HEART:  Regular rate rhythm, no murmurs, no rubs, no clicks Abd:   soft, positive bowel sounds, no organomegally, no rebound, no guarding Ext:  2 plus pulses, no edema, no cyanosis, no clubbing Skin:  No rashes no nodules Neuro:  CN II through XII intact, motor grossly intact  DEVICE  Normal device function.  See PaceArt for details.   Assess/Plan: 1. Acute on chronic systolic/diastolic heart failure - his symptoms remain class 2. He will continue his medical therapy. His [place] is at baseline. 2. Atrial fib/flutte r- he is well controlled. Her Biv pacing 70% of the time.  3. PPM - his [employer] biv ppm is working normally. We will recheck in several months. He is programmed VVIR 4. HTN - his bp is well controlled. He usually runs a little lower.   [name].D.

## 2019-05-01 LAB — CUP PACEART INCLINIC DEVICE CHECK
Battery Remaining Longevity: 94 mo
Battery Voltage: 2.96 V
Brady Statistic RA Percent Paced: 0 %
Brady Statistic RV Percent Paced: 70 %
Date Time Interrogation Session: 20210304152000
Implantable Lead Implant Date: 20161205
Implantable Lead Implant Date: 20161205
Implantable Lead Implant Date: 20161205
Implantable Lead Location: 753858
Implantable Lead Location: 753859
Implantable Lead Location: 753860
Implantable Pulse Generator Implant Date: 20161205
Lead Channel Impedance Value: 1050 Ohm
Lead Channel Impedance Value: 487.5 Ohm
Lead Channel Pacing Threshold Amplitude: 0.75 V
Lead Channel Pacing Threshold Amplitude: 1 V
Lead Channel Pacing Threshold Pulse Width: 0.5 ms
Lead Channel Pacing Threshold Pulse Width: 0.8 ms
Lead Channel Sensing Intrinsic Amplitude: 12 mV
Lead Channel Setting Pacing Amplitude: 1.75 V
Lead Channel Setting Pacing Amplitude: 2.5 V
Lead Channel Setting Pacing Pulse Width: 0.5 ms
Lead Channel Setting Pacing Pulse Width: 0.8 ms
Lead Channel Setting Sensing Sensitivity: 2 mV
Pulse Gen Model: 3262
Pulse Gen Serial Number: 7802901

## 2019-05-11 DIAGNOSIS — Z9181 History of falling: Secondary | ICD-10-CM | POA: Diagnosis not present

## 2019-05-11 DIAGNOSIS — M5416 Radiculopathy, lumbar region: Secondary | ICD-10-CM | POA: Diagnosis not present

## 2019-05-11 DIAGNOSIS — Z7902 Long term (current) use of antithrombotics/antiplatelets: Secondary | ICD-10-CM | POA: Diagnosis not present

## 2019-05-11 DIAGNOSIS — Z7952 Long term (current) use of systemic steroids: Secondary | ICD-10-CM | POA: Diagnosis not present

## 2019-05-14 DIAGNOSIS — N3000 Acute cystitis without hematuria: Secondary | ICD-10-CM | POA: Diagnosis not present

## 2019-05-15 DIAGNOSIS — R1030 Lower abdominal pain, unspecified: Secondary | ICD-10-CM | POA: Diagnosis not present

## 2019-05-15 DIAGNOSIS — R197 Diarrhea, unspecified: Secondary | ICD-10-CM | POA: Diagnosis not present

## 2019-05-15 DIAGNOSIS — K589 Irritable bowel syndrome without diarrhea: Secondary | ICD-10-CM | POA: Diagnosis not present

## 2019-05-16 ENCOUNTER — Inpatient Hospital Stay (HOSPITAL_COMMUNITY)
Admission: EM | Admit: 2019-05-16 | Discharge: 2019-05-18 | DRG: 872 | Disposition: A | Discharge status: Home / Self Care | Payer: Medicare HMO | Attending: Internal Medicine | Admitting: Internal Medicine

## 2019-05-16 ENCOUNTER — Other Ambulatory Visit: Payer: Self-pay

## 2019-05-16 ENCOUNTER — Encounter (HOSPITAL_COMMUNITY): Payer: Self-pay

## 2019-05-16 ENCOUNTER — Emergency Department (HOSPITAL_COMMUNITY): Payer: Medicare HMO

## 2019-05-16 DIAGNOSIS — I429 Cardiomyopathy, unspecified: Secondary | ICD-10-CM | POA: Diagnosis present

## 2019-05-16 DIAGNOSIS — I340 Nonrheumatic mitral (valve) insufficiency: Secondary | ICD-10-CM | POA: Diagnosis present

## 2019-05-16 DIAGNOSIS — E860 Dehydration: Secondary | ICD-10-CM | POA: Diagnosis present

## 2019-05-16 DIAGNOSIS — Z87891 Personal history of nicotine dependence: Secondary | ICD-10-CM

## 2019-05-16 DIAGNOSIS — C61 Malignant neoplasm of prostate: Secondary | ICD-10-CM | POA: Diagnosis not present

## 2019-05-16 DIAGNOSIS — I252 Old myocardial infarction: Secondary | ICD-10-CM

## 2019-05-16 DIAGNOSIS — Z950 Presence of cardiac pacemaker: Secondary | ICD-10-CM | POA: Diagnosis not present

## 2019-05-16 DIAGNOSIS — I2510 Atherosclerotic heart disease of native coronary artery without angina pectoris: Secondary | ICD-10-CM | POA: Diagnosis present

## 2019-05-16 DIAGNOSIS — H9190 Unspecified hearing loss, unspecified ear: Secondary | ICD-10-CM | POA: Diagnosis present

## 2019-05-16 DIAGNOSIS — Z96643 Presence of artificial hip joint, bilateral: Secondary | ICD-10-CM | POA: Diagnosis present

## 2019-05-16 DIAGNOSIS — I5022 Chronic systolic (congestive) heart failure: Secondary | ICD-10-CM | POA: Diagnosis present

## 2019-05-16 DIAGNOSIS — N1831 Chronic kidney disease, stage 3a: Secondary | ICD-10-CM | POA: Diagnosis present

## 2019-05-16 DIAGNOSIS — A09 Infectious gastroenteritis and colitis, unspecified: Secondary | ICD-10-CM | POA: Diagnosis not present

## 2019-05-16 DIAGNOSIS — Z955 Presence of coronary angioplasty implant and graft: Secondary | ICD-10-CM | POA: Diagnosis not present

## 2019-05-16 DIAGNOSIS — K219 Gastro-esophageal reflux disease without esophagitis: Secondary | ICD-10-CM | POA: Diagnosis present

## 2019-05-16 DIAGNOSIS — A419 Sepsis, unspecified organism: Secondary | ICD-10-CM | POA: Diagnosis not present

## 2019-05-16 DIAGNOSIS — N400 Enlarged prostate without lower urinary tract symptoms: Secondary | ICD-10-CM | POA: Diagnosis present

## 2019-05-16 DIAGNOSIS — I4821 Permanent atrial fibrillation: Secondary | ICD-10-CM | POA: Diagnosis present

## 2019-05-16 DIAGNOSIS — B379 Candidiasis, unspecified: Secondary | ICD-10-CM | POA: Diagnosis present

## 2019-05-16 DIAGNOSIS — Z20822 Contact with and (suspected) exposure to covid-19: Secondary | ICD-10-CM | POA: Diagnosis present

## 2019-05-16 DIAGNOSIS — Z96653 Presence of artificial knee joint, bilateral: Secondary | ICD-10-CM | POA: Diagnosis present

## 2019-05-16 DIAGNOSIS — M1990 Unspecified osteoarthritis, unspecified site: Secondary | ICD-10-CM | POA: Diagnosis not present

## 2019-05-16 DIAGNOSIS — Z888 Allergy status to other drugs, medicaments and biological substances status: Secondary | ICD-10-CM

## 2019-05-16 DIAGNOSIS — I480 Paroxysmal atrial fibrillation: Secondary | ICD-10-CM | POA: Diagnosis present

## 2019-05-16 DIAGNOSIS — R0602 Shortness of breath: Secondary | ICD-10-CM | POA: Diagnosis not present

## 2019-05-16 DIAGNOSIS — Z7902 Long term (current) use of antithrombotics/antiplatelets: Secondary | ICD-10-CM

## 2019-05-16 DIAGNOSIS — I447 Left bundle-branch block, unspecified: Secondary | ICD-10-CM | POA: Diagnosis present

## 2019-05-16 DIAGNOSIS — Z800 Family history of malignant neoplasm of digestive organs: Secondary | ICD-10-CM

## 2019-05-16 DIAGNOSIS — D631 Anemia in chronic kidney disease: Secondary | ICD-10-CM | POA: Diagnosis present

## 2019-05-16 DIAGNOSIS — K5730 Diverticulosis of large intestine without perforation or abscess without bleeding: Secondary | ICD-10-CM | POA: Diagnosis not present

## 2019-05-16 DIAGNOSIS — B9689 Other specified bacterial agents as the cause of diseases classified elsewhere: Secondary | ICD-10-CM | POA: Diagnosis present

## 2019-05-16 DIAGNOSIS — I708 Atherosclerosis of other arteries: Secondary | ICD-10-CM | POA: Diagnosis present

## 2019-05-16 DIAGNOSIS — Z825 Family history of asthma and other chronic lower respiratory diseases: Secondary | ICD-10-CM

## 2019-05-16 DIAGNOSIS — I130 Hypertensive heart and chronic kidney disease with heart failure and stage 1 through stage 4 chronic kidney disease, or unspecified chronic kidney disease: Secondary | ICD-10-CM | POA: Diagnosis present

## 2019-05-16 DIAGNOSIS — G4730 Sleep apnea, unspecified: Secondary | ICD-10-CM | POA: Diagnosis present

## 2019-05-16 DIAGNOSIS — E785 Hyperlipidemia, unspecified: Secondary | ICD-10-CM | POA: Diagnosis present

## 2019-05-16 DIAGNOSIS — Z974 Presence of external hearing-aid: Secondary | ICD-10-CM | POA: Diagnosis not present

## 2019-05-16 DIAGNOSIS — N390 Urinary tract infection, site not specified: Secondary | ICD-10-CM | POA: Diagnosis present

## 2019-05-16 DIAGNOSIS — R197 Diarrhea, unspecified: Secondary | ICD-10-CM | POA: Diagnosis not present

## 2019-05-16 DIAGNOSIS — R6520 Severe sepsis without septic shock: Secondary | ICD-10-CM | POA: Diagnosis present

## 2019-05-16 DIAGNOSIS — I5042 Chronic combined systolic (congestive) and diastolic (congestive) heart failure: Secondary | ICD-10-CM | POA: Diagnosis present

## 2019-05-16 DIAGNOSIS — Z885 Allergy status to narcotic agent status: Secondary | ICD-10-CM

## 2019-05-16 DIAGNOSIS — Z8249 Family history of ischemic heart disease and other diseases of the circulatory system: Secondary | ICD-10-CM

## 2019-05-16 LAB — CBC
HCT: 39.6 % (ref 39.0–52.0)
Hemoglobin: 13 g/dL (ref 13.0–17.0)
MCH: 31.3 pg (ref 26.0–34.0)
MCHC: 32.8 g/dL (ref 30.0–36.0)
MCV: 95.2 fL (ref 80.0–100.0)
Platelets: 146 10*3/uL — ABNORMAL LOW (ref 150–400)
RBC: 4.16 MIL/uL — ABNORMAL LOW (ref 4.22–5.81)
RDW: 14.4 % (ref 11.5–15.5)
WBC: 13.5 10*3/uL — ABNORMAL HIGH (ref 4.0–10.5)
nRBC: 0 % (ref 0.0–0.2)

## 2019-05-16 LAB — COMPREHENSIVE METABOLIC PANEL
ALT: 19 U/L (ref 0–44)
ALT: 17 U/L (ref 0–44)
AST: 22 U/L (ref 15–41)
AST: 18 U/L (ref 15–41)
Albumin: 3.8 g/dL (ref 3.5–5.0)
Albumin: 3.4 g/dL — ABNORMAL LOW (ref 3.5–5.0)
Alkaline Phosphatase: 92 U/L (ref 38–126)
Alkaline Phosphatase: 75 U/L (ref 38–126)
Anion gap: 9 (ref 5–15)
Anion gap: 7 (ref 5–15)
BUN: 18 mg/dL (ref 8–23)
BUN: 16 mg/dL (ref 8–23)
CO2: 25 mmol/L (ref 22–32)
CO2: 27 mmol/L (ref 22–32)
Calcium: 8.7 mg/dL — ABNORMAL LOW (ref 8.9–10.3)
Calcium: 8.5 mg/dL — ABNORMAL LOW (ref 8.9–10.3)
Chloride: 102 mmol/L (ref 98–111)
Chloride: 104 mmol/L (ref 98–111)
Creatinine, Ser: 1.23 mg/dL (ref 0.61–1.24)
Creatinine, Ser: 1.23 mg/dL (ref 0.61–1.24)
GFR calc Af Amer: 60 mL/min — ABNORMAL LOW (ref >60)
GFR calc Af Amer: 60 mL/min — ABNORMAL LOW (ref >60)
GFR calc non Af Amer: 51 mL/min — ABNORMAL LOW (ref >60)
GFR calc non Af Amer: 51 mL/min — ABNORMAL LOW (ref >60)
Glucose, Bld: 121 mg/dL — ABNORMAL HIGH (ref 70–99)
Glucose, Bld: 136 mg/dL — ABNORMAL HIGH (ref 70–99)
Potassium: 3.8 mmol/L (ref 3.5–5.1)
Potassium: 3.9 mmol/L (ref 3.5–5.1)
Sodium: 136 mmol/L (ref 135–145)
Sodium: 138 mmol/L (ref 135–145)
Total Bilirubin: 0.8 mg/dL (ref 0.3–1.2)
Total Bilirubin: 1.3 mg/dL — ABNORMAL HIGH (ref 0.3–1.2)
Total Protein: 6 g/dL — ABNORMAL LOW (ref 6.5–8.1)
Total Protein: 5.7 g/dL — ABNORMAL LOW (ref 6.5–8.1)

## 2019-05-16 LAB — URINALYSIS, ROUTINE W REFLEX MICROSCOPIC
Bilirubin Urine: NEGATIVE
Bilirubin Urine: NEGATIVE
Glucose, UA: NEGATIVE mg/dL
Glucose, UA: NEGATIVE mg/dL
Ketones, ur: NEGATIVE mg/dL
Ketones, ur: NEGATIVE mg/dL
Nitrite: POSITIVE — AB
Nitrite: POSITIVE — AB
Protein, ur: NEGATIVE mg/dL
Protein, ur: NEGATIVE mg/dL
Specific Gravity, Urine: 1.01 (ref 1.005–1.030)
Specific Gravity, Urine: 1.02 (ref 1.005–1.030)
WBC, UA: >50 WBC/hpf — ABNORMAL HIGH (ref 0–5)
pH: 6.5 (ref 5.0–8.0)
pH: 7 (ref 5.0–8.0)

## 2019-05-16 LAB — CBC WITH DIFFERENTIAL/PLATELET
Abs Immature Granulocytes: 0.04 10*3/uL (ref 0.00–0.07)
Basophils Absolute: 0 10*3/uL (ref 0.0–0.1)
Basophils Relative: 0 %
Eosinophils Absolute: 0.1 10*3/uL (ref 0.0–0.5)
Eosinophils Relative: 1 %
HCT: 42.4 % (ref 39.0–52.0)
Hemoglobin: 14.1 g/dL (ref 13.0–17.0)
Immature Granulocytes: 0 %
Lymphocytes Relative: 12 %
Lymphs Abs: 1.3 10*3/uL (ref 0.7–4.0)
MCH: 31.1 pg (ref 26.0–34.0)
MCHC: 33.3 g/dL (ref 30.0–36.0)
MCV: 93.6 fL (ref 80.0–100.0)
Monocytes Absolute: 0.9 10*3/uL (ref 0.1–1.0)
Monocytes Relative: 8 %
Neutro Abs: 8.6 10*3/uL — ABNORMAL HIGH (ref 1.7–7.7)
Neutrophils Relative %: 79 %
Platelets: 150 10*3/uL (ref 150–400)
RBC: 4.53 MIL/uL (ref 4.22–5.81)
RDW: 14.1 % (ref 11.5–15.5)
WBC: 11 10*3/uL — ABNORMAL HIGH (ref 4.0–10.5)
nRBC: 0 % (ref 0.0–0.2)

## 2019-05-16 LAB — URINALYSIS, MICROSCOPIC (REFLEX)

## 2019-05-16 LAB — SARS CORONAVIRUS 2 (TAT 6-24 HRS): SARS Coronavirus 2: NEGATIVE

## 2019-05-16 LAB — CREATININE, SERUM
Creatinine, Ser: 1.39 mg/dL — ABNORMAL HIGH (ref 0.61–1.24)
GFR calc Af Amer: 51 mL/min — ABNORMAL LOW (ref >60)
GFR calc non Af Amer: 44 mL/min — ABNORMAL LOW (ref >60)

## 2019-05-16 LAB — POC SARS CORONAVIRUS 2 AG -  ED: SARS Coronavirus 2 Ag: NEGATIVE

## 2019-05-16 LAB — C DIFFICILE QUICK SCREEN W PCR REFLEX
C Diff antigen: NEGATIVE
C Diff interpretation: NOT DETECTED
C Diff toxin: NEGATIVE

## 2019-05-16 LAB — LACTIC ACID, PLASMA
Lactic Acid, Venous: 2.7 mmol/L (ref 0.5–1.9)
Lactic Acid, Venous: 2.6 mmol/L (ref 0.5–1.9)
Lactic Acid, Venous: 2 mmol/L (ref 0.5–1.9)

## 2019-05-16 LAB — APTT: aPTT: 27 seconds (ref 24–36)

## 2019-05-16 LAB — MAGNESIUM: Magnesium: 1.9 mg/dL (ref 1.7–2.4)

## 2019-05-16 LAB — PROTIME-INR
INR: 1.1 (ref 0.8–1.2)
Prothrombin Time: 13.6 seconds (ref 11.4–15.2)

## 2019-05-16 MED ORDER — SODIUM CHLORIDE 0.9 % IV SOLN: 250.0000 mL | INTRAVENOUS | Status: DC | PRN

## 2019-05-16 MED ORDER — BRIMONIDINE TARTRATE 0.15 % OP SOLN
1.0000 [drp] | Freq: Two times a day (BID) | OPHTHALMIC | Status: DC
  Administered 2019-05-16 – 2019-05-18 (×4): 1 [drp] via OPHTHALMIC
  Filled 2019-05-16: qty 5

## 2019-05-16 MED ORDER — SODIUM CHLORIDE 0.9 % IV BOLUS
500.0000 mL | Freq: Once | INTRAVENOUS | Status: AC
  Administered 2019-05-16: 500 mL via INTRAVENOUS

## 2019-05-16 MED ORDER — LACTATED RINGERS IV BOLUS
1000.0000 mL | Freq: Once | INTRAVENOUS | Status: AC
  Administered 2019-05-16: 1000 mL via INTRAVENOUS

## 2019-05-16 MED ORDER — ONDANSETRON HCL 4 MG/2ML IJ SOLN
4.0000 mg | Freq: Once | INTRAMUSCULAR | Status: AC
  Administered 2019-05-16: 4 mg via INTRAVENOUS
  Filled 2019-05-16: qty 2

## 2019-05-16 MED ORDER — SODIUM CHLORIDE 0.9% FLUSH: 3.0000 mL | INTRAVENOUS | Status: DC | PRN

## 2019-05-16 MED ORDER — ACETAMINOPHEN 325 MG PO TABS: 650.0000 mg | ORAL_TABLET | Freq: Four times a day (QID) | ORAL | Status: DC | PRN

## 2019-05-16 MED ORDER — TAMSULOSIN HCL 0.4 MG PO CAPS
0.4000 mg | ORAL_CAPSULE | Freq: Every day | ORAL | Status: DC
  Administered 2019-05-16 – 2019-05-18 (×3): 0.4 mg via ORAL
  Filled 2019-05-16 (×3): qty 1

## 2019-05-16 MED ORDER — SODIUM CHLORIDE 0.9 % IV SOLN: 2.0000 g | Freq: Once | INTRAVENOUS | Status: DC

## 2019-05-16 MED ORDER — CLOPIDOGREL BISULFATE 75 MG PO TABS
75.0000 mg | ORAL_TABLET | Freq: Every day | ORAL | Status: DC
  Administered 2019-05-16 – 2019-05-18 (×3): 75 mg via ORAL
  Filled 2019-05-16 (×3): qty 1

## 2019-05-16 MED ORDER — PROSIGHT PO TABS
1.0000 | ORAL_TABLET | Freq: Every day | ORAL | Status: DC
  Administered 2019-05-16 – 2019-05-18 (×3): 1 via ORAL
  Filled 2019-05-16 (×3): qty 1

## 2019-05-16 MED ORDER — OCUVITE-LUTEIN PO CAPS
1.0000 | ORAL_CAPSULE | Freq: Every day | ORAL | Status: DC
  Filled 2019-05-16: qty 1

## 2019-05-16 MED ORDER — TAMSULOSIN HCL 0.4 MG PO CAPS: 0.4000 mg | ORAL_CAPSULE | Freq: Every day | ORAL | Status: DC

## 2019-05-16 MED ORDER — VANCOMYCIN 50 MG/ML ORAL SOLUTION
125.0000 mg | Freq: Four times a day (QID) | ORAL | Status: DC
  Administered 2019-05-16 – 2019-05-17 (×5): 125 mg via ORAL
  Filled 2019-05-16 (×9): qty 2.5

## 2019-05-16 MED ORDER — ONDANSETRON HCL 4 MG/2ML IJ SOLN: 4.0000 mg | Freq: Four times a day (QID) | INTRAMUSCULAR | Status: DC | PRN

## 2019-05-16 MED ORDER — ACETAMINOPHEN 650 MG RE SUPP: 650.0000 mg | Freq: Four times a day (QID) | RECTAL | Status: DC | PRN

## 2019-05-16 MED ORDER — ACETAMINOPHEN 500 MG PO TABS
1000.0000 mg | ORAL_TABLET | Freq: Once | ORAL | Status: AC
  Administered 2019-05-16: 1000 mg via ORAL
  Filled 2019-05-16: qty 2

## 2019-05-16 MED ORDER — PANTOPRAZOLE SODIUM 40 MG PO TBEC
40.0000 mg | DELAYED_RELEASE_TABLET | Freq: Every day | ORAL | Status: DC
  Administered 2019-05-16 – 2019-05-18 (×3): 40 mg via ORAL
  Filled 2019-05-16 (×2): qty 1

## 2019-05-16 MED ORDER — ONDANSETRON HCL 4 MG PO TABS: 4.0000 mg | ORAL_TABLET | Freq: Four times a day (QID) | ORAL | Status: DC | PRN

## 2019-05-16 MED ORDER — IOHEXOL 300 MG/ML  SOLN
100.0000 mL | Freq: Once | INTRAMUSCULAR | Status: AC | PRN
  Administered 2019-05-16: 100 mL via INTRAVENOUS

## 2019-05-16 MED ORDER — DORZOLAMIDE HCL-TIMOLOL MAL 2-0.5 % OP SOLN
1.0000 [drp] | Freq: Two times a day (BID) | OPHTHALMIC | Status: DC
  Administered 2019-05-16 – 2019-05-18 (×4): 1 [drp] via OPHTHALMIC
  Filled 2019-05-16: qty 10

## 2019-05-16 MED ORDER — CLOPIDOGREL BISULFATE 75 MG PO TABS: 75.0000 mg | ORAL_TABLET | Freq: Every day | ORAL | Status: DC

## 2019-05-16 MED ORDER — PANTOPRAZOLE SODIUM 40 MG PO TBEC: 40.0000 mg | DELAYED_RELEASE_TABLET | Freq: Every day | ORAL | Status: DC

## 2019-05-16 MED ORDER — SODIUM CHLORIDE (PF) 0.9 % IJ SOLN
INTRAMUSCULAR | Status: AC
  Administered 2019-05-16: 3 mL via INTRAVENOUS
  Filled 2019-05-16: qty 50

## 2019-05-16 MED ORDER — DICLOFENAC EPOLAMINE 1.3 % EX PTCH
1.0000 | MEDICATED_PATCH | Freq: Two times a day (BID) | CUTANEOUS | Status: DC
  Administered 2019-05-16 – 2019-05-18 (×4): 1 via TRANSDERMAL
  Filled 2019-05-16 (×4): qty 1

## 2019-05-16 MED ORDER — NYSTATIN 100000 UNIT/ML MT SUSP
5.0000 mL | Freq: Four times a day (QID) | OROMUCOSAL | Status: DC
  Administered 2019-05-16 – 2019-05-18 (×5): 500000 [IU] via ORAL
  Filled 2019-05-16 (×7): qty 5

## 2019-05-16 MED ORDER — SODIUM CHLORIDE 0.9% FLUSH
3.0000 mL | Freq: Two times a day (BID) | INTRAVENOUS | Status: DC
  Administered 2019-05-17 – 2019-05-18 (×3): 3 mL via INTRAVENOUS

## 2019-05-16 MED ORDER — ENOXAPARIN SODIUM 40 MG/0.4ML ~~LOC~~ SOLN
40.0000 mg | SUBCUTANEOUS | Status: DC
  Administered 2019-05-16 – 2019-05-17 (×2): 40 mg via SUBCUTANEOUS
  Filled 2019-05-16 (×2): qty 0.4

## 2019-05-16 MED ORDER — PRESERVISION AREDS 2 PO CAPS: 1.0000 | ORAL_CAPSULE | Freq: Every day | ORAL | Status: DC

## 2019-05-16 MED ORDER — SODIUM CHLORIDE 0.9 % IV SOLN: 500.0000 mg | Freq: Once | INTRAVENOUS | Status: DC

## 2019-05-16 MED ORDER — TRAMADOL HCL 50 MG PO TABS
50.0000 mg | ORAL_TABLET | ORAL | Status: DC | PRN
  Administered 2019-05-16: 50 mg via ORAL
  Filled 2019-05-16: qty 1

## 2019-05-16 NOTE — ED Notes (Signed)
Chrys Racer, RN attempted to call Caren Griffins (patient's daughter in law) at this time to inform her that patient got a bed.

## 2019-05-16 NOTE — ED Notes (Signed)
Chrys Racer, RN explained to patient and daughter-in-law that MD ordered a in and out cath. Patient refused in and out cath due to being able to adequately urinate.  Chrys Racer, RN paged Dr. Wynelle Cleveland at this time and will give update.

## 2019-05-16 NOTE — ED Notes (Signed)
Patient given urinal and instructed that urine sample is needed.

## 2019-05-16 NOTE — ED Notes (Signed)
Dr. Wynelle Cleveland called Chrys Racer, RN and asked for an in and out cath on patient. Tthe only reason an in and out cath was not done initially because order specifically stated "do in and out cath STAT if patient has not already urinated". Patient had already urinated therefore RN did not in and out cath patient due to avoiding unnecessary invasive interventions. RN will in and out patient now. Dr. Wynelle Cleveland notified.

## 2019-05-16 NOTE — ED Notes (Signed)
Date and time results received: 05/16/19 .now  (use smartphrase ".now" to insert current time)  Test: Lactic  Critical Value: 2.7  Name of Provider Notified: Mesner, MD  Orders Received? Or Actions Taken?:  See chart

## 2019-05-16 NOTE — Progress Notes (Signed)
Notified bedside nurse of need to draw repeat lactic acid. 

## 2019-05-16 NOTE — ED Triage Notes (Addendum)
Daughter in law at bedside  C/o diarrhea, fatigue, weakness  "I can't hold myself up, I can't control my bowels"  Patient diagnosed with CDIF yesterday per daughter in law

## 2019-05-16 NOTE — ED Provider Notes (Signed)
Emergency Department Provider Note   I have reviewed the triage vital signs and the nursing notes.   HISTORY  Chief Complaint No chief complaint on file.   HPI [name] is a 86 y.o. male who presents to the emergency department today with diarrhea and weakness.  Patient states he has had diarrhea for couple weeks.  He is actually here with his daughter-in-law who provides a lot of the history of sounds like he was diagnosed with C. difficile at the doctor's office yesterday but there is no record of that that we have.  Soundly he was on doxycycline recently for some type of urinary tract infection but that is unclear what the actual indication was.  He has had multiple episodes of watery diarrhea a day with some weight loss for the last couple weeks.  No known fevers, sick contacts, suspicious food.  No bloody diarrhea.  Patient does feel little bit bloated but no severe abdominal pain.  Has some shortness of breath likely related to the generalized weakness.  No falls.  No focal weakness. No rash   No other associated or modifying symptoms.    Past Medical History:  Diagnosis Date  . Anxiety   . Arthritis   . Atrial fibrillation, permanent ([place]) 01/07/2016  . Blood transfusion    "related to a surgery"  . BPH (benign prostatic hypertrophy) with urinary obstruction    s/p turp yrs ago  . Bradycardia    a. Amio d/c'd 08/2013; brady arrest 08/2013 after PCI >>> recurrent AF >>> Amiodarone restarted. b. Pacemaker being considered in 11/2014.  [name] CAD (coronary artery disease)    a. s/p MI and prior PCI of LAD;  b. LHC (08/2013):  prox LAD 60-70%, mid LAD stents ok, ostial lesion at Dx jailed by stent, mild CFX and RCA disesase >>>  PCI (09/08/13):  rotational atherectomy + Promus DES to prox LAD  . Cardiomyopathy ([place])    a. Echo (08/2013):  EF 30-35%, AS hypokinesis, Gr 1 diast dysfn, mild MR, mild LAE >>> b. improved EF 50-55% by echo 8/15. c. EF down again by echo 12/2014 to  30-35% but 51% by nuc.  [name] Chronic lower back pain   . Chronic systolic CHF (congestive heart failure) ([place])   . CKD (chronic kidney disease), stage III    a. Per review of labs baseline Cr 1.1-1.3.  [name] DDD (degenerative disc disease)    chronic back pain  . Depression   . Diverticulitis of colon with bleeding    s/p sigmoid resection '88  . DJD (degenerative joint disease) hips and knees   s/p bilateral total replacements  . GERD (gastroesophageal reflux disease)    occ. take prevacid  . History of GI diverticular bleed april 2012   transfused blood and resolved without surgical intervention  . Hypertension   . Impaired hearing bilateral    only left hearing aid  . Impotence, organic    s/p penile prosthesis 1990's  . Incomplete bladder emptying   . LBBB (left bundle branch block)   . Meningioma ([place]) right -sided w/ right VI palsy   followed by dr Gaynell Face  . Mitral regurgitation    a. Mild-mod by echo 12/2014.  [name] Myocardial infarction [facility]) 1980's- medical intervention   "so mild I didn't know I'd had it"  . PAF (paroxysmal atrial fibrillation) ([facility])    not on coumadin due to hx of GI bleed.  . Prostate cancer ([place]) 11/30/13   Gleason 8,  volume 22.14 cc  . Sleep apnea    non-compliant cpap    Patient Active Problem List   Diagnosis Date Noted  . Severe sepsis ([place]) 05/16/2019  . Biventricular cardiac pacemaker in situ 04/30/2019  . Atrial fibrillation, permanent ([place]) 01/07/2016  . Orthostatic hypotension 02/06/2015  . Weakness 01/05/2015  . History of GI bleed 01/05/2015  . Hypokalemia 01/05/2015  . Dyspnea 11/30/2014  . Fatigue 02/22/2014  . Coronary artery disease involving native coronary artery of native heart without angina pectoris   . Prostate cancer ([place])   . GI bleed 02/09/2014  . Chest pain 02/09/2014  . Malignant neoplasm of prostate ([place]) 12/15/2013  . Other malaise and fatigue 11/16/2013  . Chronic systolic heart failure ([place]) 10/12/2013  . Left  bundle branch block 10/12/2013  . Angina, class III ([place]) 09/05/2013  . Bradycardia   . Coronary atherosclerosis of native coronary artery 09/03/2013  . Mitral valve disorder 07/10/2013  . Cough 07/10/2013  . Nodular prostate without urinary obstruction 01/08/2011  . Hypertrophy of prostate without urinary obstruction and other lower urinary tract symptoms (LUTS) 01/08/2011  . Impotence of organic origin 01/08/2011    Past Surgical History:  Procedure Laterality Date  . APPENDECTOMY    . CARDIAC CATHETERIZATION  2007   noncritical cad (results w/ chart)  . CARDIOVERSION N/A 10/13/2015   Procedure: CARDIOVERSION;  Surgeon: Josue Hector, MD;  Location: [place];  Service: Cardiovascular;  Laterality: N/A;  . CATARACT EXTRACTION W/ INTRAOCULAR LENS  IMPLANT, BILATERAL Bilateral ~ 2000  . CHOLECYSTECTOMY    . CLOSED REDUCTION HIP DISLOCATION Right "several"  . CORONARY ANGIOPLASTY WITH STENT PLACEMENT  08-03-08   drug-eluting stent x2 distal and mid lad  . EP IMPLANTABLE DEVICE N/A 01/31/2015   Procedure: BiV Pacemaker Insertion CRT-P;  Surgeon: Evans Lance, MD;  Location: [place] [facility];  Service: Cardiovascular;  Laterality: N/A;  . EP IMPLANTABLE DEVICE N/A 02/07/2015   Procedure: Lead Revision/Repair;  Surgeon: Deboraha Sprang, MD;  Location: [place] [facility];  Service: Cardiovascular;  Laterality: N/A;  . ESOPHAGOGASTRODUODENOSCOPY N/A 02/11/2014   Procedure: ESOPHAGOGASTRODUODENOSCOPY (EGD);  Surgeon: Cleotis Nipper, MD;  Location[facility];  Service: Endoscopy;  Laterality: N/A;  . gamma knife radiation  2000   [facility] for meningioma, last eval 2013- no change  . INGUINAL HERNIA REPAIR Bilateral   . INNER EAR SURGERY Right yrs ago   "trying to get my hearing back  . LEFT HEART CATHETERIZATION WITH CORONARY ANGIOGRAM N/A 09/04/2013   Procedure: LEFT HEART CATHETERIZATION WITH CORONARY ANGIOGRAM;  Surgeon: Jettie Booze, MD;  Location: [facility] CATH LAB;  Service:  Cardiovascular;  Laterality: N/A;  . PENILE PROSTHESIS IMPLANT  1990's  . PERCUTANEOUS CORONARY ROTOBLATOR INTERVENTION (PCI-R) N/A 09/08/2013   Procedure: PERCUTANEOUS CORONARY ROTOBLATOR INTERVENTION (PCI-R);  Surgeon: Jettie Booze, MD;  Location: [facility] CATH LAB;  Service: Cardiovascular;  Laterality: N/A;  . PROSTATE BIOPSY  11/30/13   Gleason 8, vol 22.14 cc  . REVISION TOTAL KNEE ARTHROPLASTY Right   . SHOULDER OPEN ROTATOR CUFF REPAIR Left   . TOTAL HIP ARTHROPLASTY Right 03-25-08--  . TOTAL HIP ARTHROPLASTY Left 2005  . TOTAL HIP REVISION Right 3-4 times  . TOTAL KNEE ARTHROPLASTY Right 2004  . TOTAL KNEE ARTHROPLASTY Left 1997  . TRANSURETHRAL RESECTION OF PROSTATE  "years ago"  . TRANSURETHRAL RESECTION OF PROSTATE  01/08/2011   Procedure: TRANSURETHRAL RESECTION OF THE PROSTATE (TURP);  Surgeon: Franchot Gallo;  Location: [facility];  Service: Urology;  Laterality: N/A;  GYRUS     Current Outpatient Rx  . Order #: [id number] Class: Historical Med  . Order #: [id number] Class: Normal  . Order #: [id number] Class: Historical Med  . Order #: [id number] Class: Print  . Order #: [id number] Class: Historical Med  . Order #: [id number] Class: Historical Med  . Order #: [id number] Class: Historical Med  . Order #: [id number] Class: Historical Med  . Order #: [id number] Class: Normal  . Order #: [id number] Class: Historical Med  . Order #: [id number] Class: Historical Med  . Order #: [id number] Class: Historical Med  . Order #: [id number] Class: Normal  . Order #: [id number] Class: Historical Med  . Order #: [id number] Class: Historical Med  . Order #: [id number] Class: Historical Med  . Order #: [id number] Class: No Print  . Order #: [id number] Class: Historical Med  . Order #: [id number] Class: Historical Med  . Order #: [id number] Class: Historical Med    Allergies Codeine, Omeprazole, and Warfarin sodium  Family History  Problem Relation Age of Onset  . Heart disease Mother   .  Heart attack Mother   . Emphysema Father   . Cancer Brother        liver cancer  . Cancer Other     Social History Social History   Tobacco Use  . Smoking status: Former Smoker    Packs/day: 1.00    Years: 14.00    Pack years: 14.00    Types: Cigarettes    Quit date: 01/05/1959    Years since quitting: 60.4  . Smokeless tobacco: Never Used  Substance Use Topics  . Alcohol use: Yes    Comment: 02/09/2014 "I'm not drinking anymore"  . Drug use: No    Review of Systems  All other systems negative except as documented in the HPI. All pertinent positives and negatives as reviewed in the HPI. ____________________________________________   PHYSICAL EXAM:  VITAL SIGNS: ED Triage Vitals  Enc Vitals Group     BP 05/16/19 0809 (!) 142/87     Pulse Rate 05/16/19 0809 (!) 104     Resp 05/16/19 0809 (!) 29     Temp 05/16/19 0809 (!) 102.6 F (39.2 C)     Temp Source 05/16/19 0809 Oral     SpO2 05/16/19 0809 93 %     Weight 05/16/19 0817 180 lb (81.6 kg)     Height 05/16/19 0817 5\' 7"  (1.702 m)    Constitutional: Alert and oriented. Well appearing and in no acute distress. Eyes: Conjunctivae are normal. PERRL. EOMI. Eyes are sunken. Head: Atraumatic. Nose: No congestion/rhinnorhea. Mouth/Throat: Mucous membranes are dry.  Oropharynx non-erythematous. Neck: No stridor.  No meningeal signs.   Cardiovascular: tachycardic rate, regular rhythm. Good peripheral circulation. Grossly normal heart sounds.   Respiratory: tachypneic respiratory effort.  No retractions. Lungs CTAB. Gastrointestinal: Soft and nontender. Mild distention.  Musculoskeletal: No lower extremity tenderness nor edema. No gross deformities of extremities. Neurologic:  Normal speech and language. No gross focal neurologic deficits are appreciated.  Skin:  Skin is warm, dry and intact. No rash noted.  ____________________________________________   LABS (all labs ordered are listed, but only abnormal results  are displayed)  Labs Reviewed  CBC WITH DIFFERENTIAL/PLATELET - Abnormal; Notable for the following components:      Result Value   WBC 11.0 (*)    Neutro Abs 8.6 (*)    All other components within normal limits  COMPREHENSIVE METABOLIC PANEL - Abnormal; Notable for the following components:   Glucose, Bld 121 (*)  Calcium 8.7 (*)    Total Protein 6.0 (*)    GFR calc non Af Amer 51 (*)    GFR calc Af Amer 60 (*)    All other components within normal limits  LACTIC ACID, PLASMA - Abnormal; Notable for the following components:   Lactic Acid, Venous 2.7 (*)    All other components within normal limits  LACTIC ACID, PLASMA - Abnormal; Notable for the following components:   Lactic Acid, Venous 2.6 (*)    All other components within normal limits  URINALYSIS, ROUTINE W REFLEX MICROSCOPIC - Abnormal; Notable for the following components:   APPearance CLOUDY (*)    Hgb urine dipstick MODERATE (*)    Nitrite POSITIVE (*)    Leukocytes,Ua SMALL (*)    All other components within normal limits  CREATININE, SERUM - Abnormal; Notable for the following components:   Creatinine, Ser 1.39 (*)    GFR calc non Af Amer 44 (*)    GFR calc Af Amer 51 (*)    All other components within normal limits  URINALYSIS, MICROSCOPIC (REFLEX) - Abnormal; Notable for the following components:   Bacteria, UA MANY (*)    All other components within normal limits  URINALYSIS, ROUTINE W REFLEX MICROSCOPIC - Abnormal; Notable for the following components:   APPearance HAZY (*)    Hgb urine dipstick SMALL (*)    Nitrite POSITIVE (*)    Leukocytes,Ua LARGE (*)    WBC, UA >50 (*)    Bacteria, UA RARE (*)    All other components within normal limits  GI PATHOGEN PANEL BY PCR, STOOL  C DIFFICILE QUICK SCREEN W PCR REFLEX  CULTURE, BLOOD (ROUTINE X 2)  CULTURE, BLOOD (ROUTINE X 2)  URINE CULTURE  SARS CORONAVIRUS 2 (TAT 6-24 HRS)  C DIFFICILE QUICK SCREEN W PCR REFLEX  URINE CULTURE  APTT    PROTIME-INR  MAGNESIUM  COMPREHENSIVE METABOLIC PANEL  CBC  LACTIC ACID, PLASMA  POC SARS CORONAVIRUS 2 AG -  ED   ____________________________________________  EKG   EKG Interpretation  Date/Time:    Ventricular Rate:    PR Interval:    QRS Duration:   QT Interval:    QTC Calculation:   R Axis:     Text Interpretation:         ____________________________________________  RADIOLOGY  CT ABDOMEN PELVIS W CONTRAST  Result Date: 05/16/2019 CLINICAL DATA:  Daughter in law at bedside C/o diarrhea, fatigue, weakness "I can't hold myself up, I can't control my bowels" Patient diagnosed with CDIFF yesterday per daughter in law Per patient "he is having some RLQ pain" ^ EXAM: CT ABDOMEN AND PELVIS WITH CONTRAST TECHNIQUE: Multidetector CT imaging of the abdomen and pelvis was performed using the standard protocol following bolus administration of intravenous contrast. CONTRAST:  116mL OMNIPAQUE IOHEXOL 300 MG/ML  SOLN COMPARISON:  11/02/2015 FINDINGS: Lower chest: No pleural or pericardial effusion. Mild cardiomegaly. Transvenous pacing leads partially visualized. Coronary calcifications/stents. Hepatobiliary: No focal liver abnormality is seen. Status post cholecystectomy. No biliary dilatation. Pancreas: Diffuse atrophy without mass or ductal dilatation. Spleen: Normal in size without focal abnormality. Adrenals/Urinary Tract: Adrenals unremarkable. Kidneys enhance normally, without mass or hydronephrosis. Urinary bladder physiologically distended. Stomach/Bowel: Stomach decompressed Small bowel is nondistended Appendix not discretely identified The colon has scattered diverticula throughout, without significant adjacent inflammatory/edematous change or abscess Vascular/Lymphatic: Extensive aortoiliac atherosclerosis (ICD10-170.0) without aneurysm. No abdominal or pelvic adenopathy. Portal vein patent. Reproductive: Penile prosthesis components partially visualized, without apparent  complication.  Other: No ascites. No free air. Musculoskeletal: Bilateral hip arthroplasty hardware, resulting in significant streak artifact degrading portions of the scan. Multilevel lumbar spondylitic changes with dextroscoliosis apex L2-3. Probable bone island in the superior left pubic ramus, stable. No acute fracture or worrisome bone lesion. IMPRESSION: 1. No acute findings. 2. Colonic diverticulosis. Aortic Atherosclerosis (ICD10-I70.0). Electronically Signed   By: Lucrezia Europe M.D.   On: 05/16/2019 10:08   DG Chest Portable 1 View  Result Date: 05/16/2019 CLINICAL DATA:  Shortness of breath. Additional history provided: Diarrhea, fatigue, weakness, shortness of breath for 2 weeks worsening last night, diagnosed with C diff yesterday, medical history of cancer, CHF, hypertension, GERD, CAD, CKD. EXAM: PORTABLE CHEST 1 VIEW COMPARISON:  Chest radiograph 08/10/2017 FINDINGS: Redemonstrated left chest AICD. Unchanged cardiomegaly. Aortic atherosclerosis. Subtle asymmetric ill-defined opacity within the right mid to lower lung field. The left lung is clear. No evidence of pleural effusion or pneumothorax. No acute bony abnormality. IMPRESSION: Subtle asymmetric ill-defined opacity within the right mid to lower lung field which may reflect asymmetric edema. Early pneumonia cannot be excluded. Unchanged cardiomegaly with aortic atherosclerosis. Electronically Signed   By: Kellie Simmering DO   On: 05/16/2019 09:17    ____________________________________________   PROCEDURES  Procedure(s) performed:   .Critical Care Performed by: Merrily Pew, MD Authorized by: Merrily Pew, MD   Critical care provider statement:    Critical care time (minutes):  45   Critical care was necessary to treat or prevent imminent or life-threatening deterioration of the following conditions:  Sepsis   Critical care was time spent personally by me on the following activities:  Discussions with consultants, evaluation of  patient's response to treatment, examination of patient, ordering and performing treatments and interventions, ordering and review of laboratory studies, ordering and review of radiographic studies, pulse oximetry, re-evaluation of patient's condition, obtaining history from patient or surrogate and review of old charts   ____________________________________________   INITIAL IMPRESSION / [place] / ED COURSE  Reported C. difficile infection with tachycardia, fever distention concern for possible toxic megacolon however patient overall appears well.  Will initiate code sepsis, oral antibiotics for suspected C. difficile infection.  Not any evidence of shock or severe sepsis at this point so we will slowly give fluids he does have a history of heart failure.  Secondary to his age and his presentation of sepsis will likely need to be admitted for this.  Pertinent labs & imaging results that were available during my care of the patient were reviewed by me and considered in my medical decision making (see chart for details).  ____________________________________________  FINAL CLINICAL IMPRESSION(S) / ED DIAGNOSES  Final diagnoses:  Diarrhea of infectious origin     MEDICATIONS GIVEN DURING THIS VISIT:  Medications  vancomycin (VANCOCIN) 50 mg/mL oral solution 125 mg (125 mg Oral Given 05/16/19 1503)  sodium chloride (PF) 0.9 % injection (has no administration in time range)  nystatin (MYCOSTATIN) 100000 UNIT/ML suspension 500,000 Units (500,000 Units Oral Given 05/16/19 1503)  clopidogrel (PLAVIX) tablet 75 mg (75 mg Oral Not Given 05/16/19 1515)  pantoprazole (PROTONIX) EC tablet 40 mg (40 mg Oral Not Given 05/16/19 1515)  traMADol (ULTRAM) tablet 50 mg (has no administration in time range)  tamsulosin (FLOMAX) capsule 0.4 mg (0.4 mg Oral Not Given 05/16/19 1516)  PreserVision AREDS 2 CAPS 1 capsule (1 capsule Oral Not Given 05/16/19 1517)  enoxaparin (LOVENOX) injection 40 mg (has  no administration in time range)  sodium chloride flush (  NS) 0.9 % injection 3 mL (3 mLs Intravenous Not Given 05/16/19 1516)  sodium chloride flush (NS) 0.9 % injection 3 mL (has no administration in time range)  0.9 %  sodium chloride infusion (has no administration in time range)  acetaminophen (TYLENOL) tablet 650 mg (has no administration in time range)    Or  acetaminophen (TYLENOL) suppository 650 mg (has no administration in time range)  ondansetron (ZOFRAN) tablet 4 mg (has no administration in time range)    Or  ondansetron (ZOFRAN) injection 4 mg (has no administration in time range)  dorzolamide-timolol (COSOPT) 22.3-6.8 MG/ML ophthalmic solution 1 drop (1 drop Both Eyes Not Given 05/16/19 1517)  diclofenac (FLECTOR) 1.3 % 1 patch (1 patch Transdermal Not Given 05/16/19 1518)  brimonidine (ALPHAGAN) 0.15 % ophthalmic solution 1 drop (1 drop Both Eyes Not Given 05/16/19 1518)  lactated ringers bolus 1,000 mL (0 mLs Intravenous Stopped 05/16/19 1110)  ondansetron (ZOFRAN) injection 4 mg (4 mg Intravenous Given 05/16/19 0901)  acetaminophen (TYLENOL) tablet 1,000 mg (1,000 mg Oral Given 05/16/19 0900)  iohexol (OMNIPAQUE) 300 MG/ML solution 100 mL (100 mLs Intravenous Contrast Given 05/16/19 0948)  sodium chloride 0.9 % bolus 500 mL (500 mLs Intravenous New Bag/Given 05/16/19 1425)     NEW OUTPATIENT MEDICATIONS STARTED DURING THIS VISIT:  New Prescriptions   No medications on file    Note:  This note was prepared with assistance of Dragon voice recognition software. Occasional wrong-word or sound-a-like substitutions may have occurred due to the inherent limitations of voice recognition software.   Rosha Cocker, Corene Cornea, MD 05/16/19 [phone]

## 2019-05-16 NOTE — H&P (Signed)
History and Physical    [name]  [id number]  DOB: [birth year]  DOA: 05/16/2019 PCP: Lawerance Cruel, MD   Patient coming from: home alone  Chief Complaint: severely weak, diarrhea.  HPI: [name] is a 86 y.o. male with medical history of A-fib, BPH, Ch systolic CHF, CAD s/p PCI and stents, CKD 3, HTN, back pain with DDD, anxiety, h /o prostate cancer, incomplete bladder emptying and frequent UTIs. Finished a course of antibiotics (Doxycycline) yesterday and was told at the urology office that his urine was clean.  He presents for diarrhea for about 4-5 days. He states his stool is brown, soft and he is having about 4-5 non bloody BMs a day. He is having some incontinence as well especially this AM when he was unable to make it to the rest room and had a very liquid BM on the floor. He is very weak and is not eating or drinking. He has had nausea but no vomiting. NO abdominal pain today but yesterday he did have in the left lower quadrant tenderness on exam in the Urology office. He took Imodium last night. In the ED he is febrile but he has not noted fevers at home. He is severely weak and can barely walk today. He is mildly short of breath with exertion but has no other respiratory symptoms. No acute joint pain. No dental pain. No headache or neck stiffness, no new rash.     ED Course: fever 102.6, RR in 20s, HR in 100s initially, BP is about 97/64 after 1 L NS bolus Lactic acid 2.7 WBC 11.0 UA was sent and still pending CT abd/pelvis is unremarkable CXR> possible edema vs pneumonia in right mid to lower lung field- pulse ox in mid to high 90s  Review of Systems:  All other systems reviewed and apart from HPI, are negative.  Past Medical History:  Diagnosis Date  . Anxiety   . Arthritis   . Atrial fibrillation, permanent ([place]) 01/07/2016  . Blood transfusion    "related to a surgery"  . BPH (benign prostatic hypertrophy) with urinary obstruction    s/p turp  yrs ago  . Bradycardia    a. Amio d/c'd 08/2013; brady arrest 08/2013 after PCI >>> recurrent AF >>> Amiodarone restarted. b. Pacemaker being considered in 11/2014.  [name] CAD (coronary artery disease)    a. s/p MI and prior PCI of LAD;  b. LHC (08/2013):  prox LAD 60-70%, mid LAD stents ok, ostial lesion at Dx jailed by stent, mild CFX and RCA disesase >>>  PCI (09/08/13):  rotational atherectomy + Promus DES to prox LAD  . Cardiomyopathy ([place])    a. Echo (08/2013):  EF 30-35%, AS hypokinesis, Gr 1 diast dysfn, mild MR, mild LAE >>> b. improved EF 50-55% by echo 8/15. c. EF down again by echo 12/2014 to 30-35% but 51% by nuc.  [name] Chronic lower back pain   . Chronic systolic CHF (congestive heart failure) ([place])   . CKD (chronic kidney disease), stage III    a. Per review of labs baseline Cr 1.1-1.3.  [name] DDD (degenerative disc disease)    chronic back pain  . Depression   . Diverticulitis of colon with bleeding    s/p sigmoid resection '88  . DJD (degenerative joint disease) hips and knees   s/p bilateral total replacements  . GERD (gastroesophageal reflux disease)    occ. take prevacid  . History of GI diverticular bleed april 2012  transfused blood and resolved without surgical intervention  . Hypertension   . Impaired hearing bilateral    only left hearing aid  . Impotence, organic    s/p penile prosthesis 1990's  . Incomplete bladder emptying   . LBBB (left bundle branch block)   . Meningioma ([place]) right -sided w/ right VI palsy   followed by dr Gaynell Face  . Mitral regurgitation    a. Mild-mod by echo 12/2014.  [name] Myocardial infarction [facility]) 1980's- medical intervention   "so mild I didn't know I'd had it"  . PAF (paroxysmal atrial fibrillation) ([facility])    not on coumadin due to hx of GI bleed.  . Prostate cancer ([place]) 11/30/13   Gleason 8, volume 22.14 cc  . Sleep apnea    non-compliant cpap    Past Surgical History:  Procedure Laterality Date  . APPENDECTOMY    . CARDIAC  CATHETERIZATION  2007   noncritical cad (results w/ chart)  . CARDIOVERSION N/A 10/13/2015   Procedure: CARDIOVERSION;  Surgeon: Josue Hector, MD;  Location: [place];  Service: Cardiovascular;  Laterality: N/A;  . CATARACT EXTRACTION W/ INTRAOCULAR LENS  IMPLANT, BILATERAL Bilateral ~ 2000  . CHOLECYSTECTOMY    . CLOSED REDUCTION HIP DISLOCATION Right "several"  . CORONARY ANGIOPLASTY WITH STENT PLACEMENT  08-03-08   drug-eluting stent x2 distal and mid lad  . EP IMPLANTABLE DEVICE N/A 01/31/2015   Procedure: BiV Pacemaker Insertion CRT-P;  Surgeon: Evans Lance, MD;  Location: [place] CV LAB;  Service: Cardiovascular;  Laterality: N/A;  . EP IMPLANTABLE DEVICE N/A 02/07/2015   Procedure: Lead Revision/Repair;  Surgeon: Deboraha Sprang, MD;  Location: [place] CV LAB;  Service: Cardiovascular;  Laterality: N/A;  . ESOPHAGOGASTRODUODENOSCOPY N/A 02/11/2014   Procedure: ESOPHAGOGASTRODUODENOSCOPY (EGD);  Surgeon: Cleotis Nipper, MD;  Location[facility] ENDOSCOPY;  Service: Endoscopy;  Laterality: N/A;  . gamma knife radiation  2000   [facility] for meningioma, last eval 2013- no change  . INGUINAL HERNIA REPAIR Bilateral   . INNER EAR SURGERY Right yrs ago   "trying to get my hearing back  . LEFT HEART CATHETERIZATION WITH CORONARY ANGIOGRAM N/A 09/04/2013   Procedure: LEFT HEART CATHETERIZATION WITH CORONARY ANGIOGRAM;  Surgeon: Jettie Booze, MD;  Location: [facility] CATH LAB;  Service: Cardiovascular;  Laterality: N/A;  . PENILE PROSTHESIS IMPLANT  1990's  . PERCUTANEOUS CORONARY ROTOBLATOR INTERVENTION (PCI-R) N/A 09/08/2013   Procedure: PERCUTANEOUS CORONARY ROTOBLATOR INTERVENTION (PCI-R);  Surgeon: Jettie Booze, MD;  Location[facility] C[facility] LAB;  Service: Cardiovascular;  Laterality: N/A;  . PROSTATE BIOPSY  11/30/13   Gleason 8, vol 22.14 cc  . REVISION TOTAL KNEE ARTHROPLASTY Right   . SHOULDER OPEN ROTATOR CUFF REPAIR Left   . TOTAL HIP ARTHROPLASTY Right 03-25-08--  . TOTAL  HIP ARTHROPLASTY Left 2005  . TOTAL HIP REVISION Right 3-4 times  . TOTAL KNEE ARTHROPLASTY Right 2004  . TOTAL KNEE ARTHROPLASTY Left 1997  . TRANSURETHRAL RESECTION OF PROSTATE  "years ago"  . TRANSURETHRAL RESECTION OF PROSTATE  01/08/2011   Procedure: TRANSURETHRAL RESECTION OF THE PROSTATE (TURP);  Surgeon: Franchot Gallo;  Location: [place];  Service: Urology;  Laterality: N/A;  GYRUS     Social History:   reports that he quit smoking about 60 years ago. His smoking use included cigarettes. He has a 14.00 pack-year smoking history. He has never used smokeless tobacco. He reports current alcohol use. He reports that he does not use drugs.  He uses  a walker on a normal day. He lives alone and has a caregiver Investment banker, corporate) during the day.   Allergies  Allergen Reactions  . Codeine Anxiety and Other (See Comments)    Insomnia/ hyper  . Omeprazole Nausea And Vomiting and Other (See Comments)    GI upset, insomnia  . Warfarin Sodium Anxiety and Other (See Comments)    Hx GI bleed    Family History  Problem Relation Age of Onset  . Heart disease Mother   . Heart attack Mother   . Emphysema Father   . Cancer Brother        liver cancer  . Cancer Other      Prior to Admission medications   Medication Sig Start Date End Date Taking? Authorizing Provider  brimonidine (ALPHAGAN) 0.15 % ophthalmic solution Place 1 drop into both eyes 2 (two) times daily. 08/11/15  Yes [provider]  clopidogrel (PLAVIX) 75 MG tablet TAKE 1 TABLET EVERY DAY Patient taking differently: Take 75 mg by mouth daily.  11/02/16  Yes Jettie Booze, MD  cyclobenzaprine (FLEXERIL) 5 MG tablet Take 2.5-5 mg by mouth as needed for muscle spasms. 11/20/18  Yes [provider]  diazepam (VALIUM) 5 MG tablet Take 1 tablet (5 mg total) by mouth every 12 (twelve) hours as needed for anxiety. 01/07/16  Yes Isaiah Serge, NP  diclofenac (FLECTOR) 1.3 % PTCH Place 1 patch onto the  skin 2 (two) times daily. 03/20/19  Yes [provider]  dorzolamide-timolol (COSOPT) 22.3-6.8 MG/ML ophthalmic solution Place 1 drop into both eyes 2 (two) times daily.  11/12/13  Yes [provider]  HYDROcodone-acetaminophen (NORCO/VICODIN) 5-325 MG tablet Take 1 tablet by mouth every 6 (six) hours as needed for moderate pain.   Yes [provider]  Multiple Vitamins-Minerals (PRESERVISION AREDS 2) CAPS Take 1 capsule by mouth daily after lunch.   Yes [provider]  nitroGLYCERIN (NITROSTAT) 0.4 MG SL tablet Place 1 tablet (0.4 mg total) under the tongue every 5 (five) minutes as needed for chest pain. 04/21/18  Yes Evans Lance, MD  Omega-3 Fatty Acids (FISH OIL PO) Take 1 capsule by mouth daily after lunch.   Yes [provider]  pantoprazole (PROTONIX) 40 MG tablet Take 1 tablet by mouth daily. 03/28/19  Yes [provider]  phenazopyridine (PYRIDIUM) 100 MG tablet Take 100 mg by mouth as needed for pain.   Yes [provider]  potassium chloride (K-DUR,KLOR-CON) 10 MEQ tablet TAKE 1 TABLET EVERY DAY AS NEEDED. WHEN YOU TAKE YOUR FUROSEMIDE. Patient taking differently: Take 10 mEq by mouth daily.  11/02/16  Yes Jettie Booze, MD  Probiotic Product (PROBIOTIC PO) Take 1 tablet by mouth 2 (two) times daily.    Yes [provider]  psyllium (METAMUCIL) 58.6 % packet Take 1 packet by mouth daily with supper. Mix in 8 oz liquid and drink   Yes [provider]  tamsulosin (FLOMAX) 0.4 MG CAPS capsule Take 0.4 mg by mouth daily. 05/17/18  Yes [provider]  torsemide (DEMADEX) 20 MG tablet Take one extra tablet of torsemide by mouth if weight gain of 3 pounds Patient taking differently: Take 20-40 mg by mouth daily. Take one extra tablet of torsemide by mouth if weight gain of 3 pounds 08/28/17  Yes Evans Lance, MD  traMADol (ULTRAM) 50 MG tablet Take 50 mg by mouth as needed for moderate pain.  03/15/15   Yes [provider]  traZODone ([place])  50 MG tablet Take 50 mg by mouth as needed for sleep.   Yes [provider]  doxycycline (VIBRAMYCIN) 100 MG capsule Take 100 mg by mouth 2 (two) times daily. 04/28/19   [provider]    Physical Exam: Wt Readings from Last 3 Encounters:  05/16/19 81.6 kg  04/30/19 81.8 kg  02/06/19 78.5 kg   Vitals:   05/16/19 1045 05/16/19 1100 05/16/19 1202 05/16/19 1230  BP: 115/65 (!) 101/56 97/64 (!) 97/57  Pulse: (!) 110 93 92 94  Resp: (!) 28 (!) 26 (!) 22 (!) 25  Temp:      TempSrc:      SpO2: 90% 93% 94% 91%  Weight:      Height:          Constitutional:  Calm & comfortable Eyes: PERRLA, lids and conjunctivae normal ENT:  Mucous membranes are moist.  Pharynx clear of exudate   Normal dentition.  Neck: Supple, no masses  Respiratory:  Clear to auscultation bilaterally  Normal respiratory effort.  Cardiovascular:  S1 & S2 heard, IIRR No Murmurs Abdomen:  Non distended mildly tender in RLQ, No masses Bowel sounds normal Extremities:  No clubbing / cyanosis No pedal edema No joint deformity    Skin:  No rashes, lesions or ulcers Neurologic:  AAO x 3 CN 2-12 grossly intact Sensation intact Strength 5/5 in all 4 extremities Psychiatric:  Normal Mood and affect    Labs on Admission: I have personally reviewed following labs and imaging studies  CBC: Recent Labs  Lab 05/16/19 0844  WBC 11.0*  NEUTROABS 8.6*  HGB 14.1  HCT 42.4  MCV 93.6  PLT [id number]   Basic Metabolic Panel: Recent Labs  Lab 05/16/19 0844  NA 136  K 3.8  CL 102  CO2 25  GLUCOSE 121*  BUN 18  CREATININE 1.23  CALCIUM 8.7*  MG 1.9   GFR: Estimated Creatinine Clearance: 40.8 mL/min (by C-G formula based on SCr of 1.23 mg/dL). Liver Function Tests: Recent Labs  Lab 05/16/19 0844  AST 22  ALT 19  ALKPHOS 92  BILITOT 0.8  PROT 6.0*  ALBUMIN 3.8   No results for input(s): LIPASE, AMYLASE in the last 168  hours. No results for input(s): AMMONIA in the last 168 hours. Coagulation Profile: Recent Labs  Lab 05/16/19 0844  INR 1.1   Cardiac Enzymes: No results for input(s): CKTOTAL, CKMB, CKMBINDEX, TROPONINI in the last 168 hours. BNP (last 3 results) No results for input(s): PROBNP in the last 8760 hours. HbA1C: No results for input(s): HGBA1C in the last 72 hours. CBG: No results for input(s): GLUCAP in the last 168 hours. Lipid Profile: No results for input(s): CHOL, HDL, LDLCALC, TRIG, CHOLHDL, LDLDIRECT in the last 72 hours. Thyroid Function Tests: No results for input(s): TSH, T4TOTAL, FREET4, T3FREE, THYROIDAB in the last 72 hours. Anemia Panel: No results for input(s): VITAMINB12, FOLATE, FERRITIN, TIBC, IRON, RETICCTPCT in the last 72 hours. Urine analysis:    Component Value Date/Time   COLORURINE YELLOW 02/06/2015 1630   APPEARANCEUR CLEAR 02/06/2015 1630   LABSPEC 1.008 02/06/2015 1630   PHURINE 6.5 02/06/2015 1630   GLUCOSEU NEGATIVE 02/06/2015 1630   HGBUR NEGATIVE 02/06/2015 1630   BILIRUBINUR NEGATIVE 02/06/2015 1630   KETONESUR NEGATIVE 02/06/2015 1630   PROTEINUR NEGATIVE 02/06/2015 1630   UROBILINOGEN 0.2 01/01/2015 1207   NITRITE NEGATIVE 02/06/2015 1630   LEUKOCYTESUR NEGATIVE 02/06/2015 1630   Sepsis Labs: @LABRCNTIP (procalcitonin:4,lacticidven:4) )No results found for this or any previous  visit (from the past 240 hour(s)).   Radiological Exams on Admission: CT ABDOMEN PELVIS W CONTRAST  Result Date: 05/16/2019 CLINICAL DATA:  Daughter in law at bedside C/o diarrhea, fatigue, weakness "I can't hold myself up, I can't control my bowels" Patient diagnosed with CDIFF yesterday per daughter in law Per patient "he is having some RLQ pain" ^ EXAM: CT ABDOMEN AND PELVIS WITH CONTRAST TECHNIQUE: Multidetector CT imaging of the abdomen and pelvis was performed using the standard protocol following bolus administration of intravenous contrast. CONTRAST:  179mL  OMNIPAQUE IOHEXOL 300 MG/ML  SOLN COMPARISON:  11/02/2015 FINDINGS: Lower chest: No pleural or pericardial effusion. Mild cardiomegaly. Transvenous pacing leads partially visualized. Coronary calcifications/stents. Hepatobiliary: No focal liver abnormality is seen. Status post cholecystectomy. No biliary dilatation. Pancreas: Diffuse atrophy without mass or ductal dilatation. Spleen: Normal in size without focal abnormality. Adrenals/Urinary Tract: Adrenals unremarkable. Kidneys enhance normally, without mass or hydronephrosis. Urinary bladder physiologically distended. Stomach/Bowel: Stomach decompressed Small bowel is nondistended Appendix not discretely identified The colon has scattered diverticula throughout, without significant adjacent inflammatory/edematous change or abscess Vascular/Lymphatic: Extensive aortoiliac atherosclerosis (ICD10-170.0) without aneurysm. No abdominal or pelvic adenopathy. Portal vein patent. Reproductive: Penile prosthesis components partially visualized, without apparent complication. Other: No ascites. No free air. Musculoskeletal: Bilateral hip arthroplasty hardware, resulting in significant streak artifact degrading portions of the scan. Multilevel lumbar spondylitic changes with dextroscoliosis apex L2-3. Probable bone island in the superior left pubic ramus, stable. No acute fracture or worrisome bone lesion. IMPRESSION: 1. No acute findings. 2. Colonic diverticulosis. Aortic Atherosclerosis (ICD10-I70.0). Electronically Signed   By: Lucrezia Europe M.D.   On: 05/16/2019 10:08   DG Chest Portable 1 View  Result Date: 05/16/2019 CLINICAL DATA:  Shortness of breath. Additional history provided: Diarrhea, fatigue, weakness, shortness of breath for 2 weeks worsening last night, diagnosed with C diff yesterday, medical history of cancer, CHF, hypertension, GERD, CAD, CKD. EXAM: PORTABLE CHEST 1 VIEW COMPARISON:  Chest radiograph 08/10/2017 FINDINGS: Redemonstrated left chest AICD.  Unchanged cardiomegaly. Aortic atherosclerosis. Subtle asymmetric ill-defined opacity within the right mid to lower lung field. The left lung is clear. No evidence of pleural effusion or pneumothorax. No acute bony abnormality. IMPRESSION: Subtle asymmetric ill-defined opacity within the right mid to lower lung field which may reflect asymmetric edema. Early pneumonia cannot be excluded. Unchanged cardiomegaly with aortic atherosclerosis. Electronically Signed   By: Kellie Simmering DO   On: 05/16/2019 09:17    EKG: Independently reviewed. A-fib with LBBB  Assessment/Plan Principal Problem:   Severe sepsis  - although the obvious source may be abdominal due to his ongoing diarrhea for 4-5 days, his CT is negative for any signs of colitis or gastroenteritis - will f/u on stool studies- he received oral Vancomycin in the ED- can continue until negative result obtained - although he states his UA was clean yesterday, we will recheck it today and send for a culture - he has mild dyspnea but no cough to suggest a pneumonia and thus will hold off on treatment for this and, more specifically, we should wait until a C diff is found to be negative - he has no other signs or symptoms to suggest any other source on infection - he has received 1 L of NS In the ED - as his Lactic acid is still a bit high and his BP on the low end, will give another 500 cc NS bolus - hold Torsemide for today- follow I and O closely  Active Problems:  Coronary atherosclerosis of native coronary artery - cont Plavix  Thrush - start Nystatin  BPH and h/o Prostate cancer - Flomax    Chronic systolic heart failure  - holding Demadex, follow I and O and daily weights     Atrial fibrillation, permanent - Biventricular cardiac pacemaker in situ - rate controlled without medications- on Plavix only    DVT prophylaxis: Lovenox  Code Status: Full code  Family Communication: daughter in law at bedside  Disposition Plan: from  home alone- will need PT eval to determined d/c disposition - I suspect he will be here for 2-3 days minimum.  Consults called: none  Admission status: inpatient    Debbe Odea MD [facility] Pager: [email or web address] Password [email or web address] 7PM-7AM, please contact night-coverage   05/16/2019, 12:42 PM

## 2019-05-16 NOTE — Progress Notes (Signed)
Pt. Arrived to unit via stretcher. Pt. Able to assist with transfer to bed. Pt. Is alert and oriented. Pt. Has one hearing aid in the left ear. And cell phone at bed side. Pt. Has red marks to chest from tele leads. Pt. Said he do not have and adhesive allergy and the spot do not bother him. New tele leads place and box attached. Pt. Refused dinner. Will continue to monitor.

## 2019-05-17 DIAGNOSIS — A419 Sepsis, unspecified organism: Principal | ICD-10-CM

## 2019-05-17 DIAGNOSIS — R6520 Severe sepsis without septic shock: Secondary | ICD-10-CM

## 2019-05-17 LAB — CBC
HCT: 37.1 % — ABNORMAL LOW (ref 39.0–52.0)
Hemoglobin: 12.2 g/dL — ABNORMAL LOW (ref 13.0–17.0)
MCH: 31.1 pg (ref 26.0–34.0)
MCHC: 32.9 g/dL (ref 30.0–36.0)
MCV: 94.6 fL (ref 80.0–100.0)
Platelets: 140 10*3/uL — ABNORMAL LOW (ref 150–400)
RBC: 3.92 MIL/uL — ABNORMAL LOW (ref 4.22–5.81)
RDW: 14.4 % (ref 11.5–15.5)
WBC: 11.7 10*3/uL — ABNORMAL HIGH (ref 4.0–10.5)
nRBC: 0 % (ref 0.0–0.2)

## 2019-05-17 LAB — URINALYSIS, ROUTINE W REFLEX MICROSCOPIC
Bilirubin Urine: NEGATIVE
Glucose, UA: NEGATIVE mg/dL
Ketones, ur: NEGATIVE mg/dL
Nitrite: NEGATIVE
Protein, ur: NEGATIVE mg/dL
Specific Gravity, Urine: 1.006 (ref 1.005–1.030)
WBC, UA: >50 WBC/hpf — ABNORMAL HIGH (ref 0–5)
pH: 6 (ref 5.0–8.0)

## 2019-05-17 LAB — LACTIC ACID, PLASMA: Lactic Acid, Venous: 2 mmol/L (ref 0.5–1.9)

## 2019-05-17 LAB — COMPREHENSIVE METABOLIC PANEL
ALT: 15 U/L (ref 0–44)
AST: 16 U/L (ref 15–41)
Albumin: 3.2 g/dL — ABNORMAL LOW (ref 3.5–5.0)
Alkaline Phosphatase: 65 U/L (ref 38–126)
Anion gap: 7 (ref 5–15)
BUN: 16 mg/dL (ref 8–23)
CO2: 24 mmol/L (ref 22–32)
Calcium: 8.3 mg/dL — ABNORMAL LOW (ref 8.9–10.3)
Chloride: 105 mmol/L (ref 98–111)
Creatinine, Ser: 1.07 mg/dL (ref 0.61–1.24)
GFR calc Af Amer: >60 mL/min (ref >60)
GFR calc non Af Amer: >60 mL/min (ref >60)
Glucose, Bld: 118 mg/dL — ABNORMAL HIGH (ref 70–99)
Potassium: 3.7 mmol/L (ref 3.5–5.1)
Sodium: 136 mmol/L (ref 135–145)
Total Bilirubin: 1.6 mg/dL — ABNORMAL HIGH (ref 0.3–1.2)
Total Protein: 5.2 g/dL — ABNORMAL LOW (ref 6.5–8.1)

## 2019-05-17 MED ORDER — TORSEMIDE 20 MG PO TABS
20.0000 mg | ORAL_TABLET | Freq: Every day | ORAL | Status: DC
  Administered 2019-05-17 – 2019-05-18 (×2): 20 mg via ORAL
  Filled 2019-05-17 (×2): qty 1

## 2019-05-17 NOTE — Plan of Care (Signed)
  Problem: Clinical Measurements: Goal: Respiratory complications will improve Outcome: Progressing   Problem: Clinical Measurements: Goal: Cardiovascular complication will be avoided Outcome: Progressing   Problem: Activity: Goal: Risk for activity intolerance will decrease Outcome: Progressing   Problem: Nutrition: Goal: Adequate nutrition will be maintained Outcome: Progressing   

## 2019-05-17 NOTE — Evaluation (Signed)
Physical Therapy Evaluation Patient Details Name: [name] MRN: [id number] DOB: [birth year] Today's Date: 05/17/2019   History of Present Illness  86 y.o. male with medical history of A-fib, BPH, Ch systolic CHF, CAD s/p PCI and stents, CKD 3, HTN, back pain with DDD, anxiety, h /o prostate cancer, incomplete bladder emptying and frequent UTIs  Clinical Impression  Patient evaluated by Physical Therapy with no further acute PT needs identified. All education has been completed and the patient has no further questions.  Pt is very active and independent at his baseline, has RN that assists as needed and has HHPT 1x per wk; agree with pt plan to continue at d/c. No further needs in acute setting.   See below for any follow-up Physical Therapy or equipment needs. PT is signing off. Thank you for this referral.   Follow Up Recommendations Other (comment)(has HHPT 1x per wk, rec continue)    Equipment Recommendations  None recommended by PT    Recommendations for Other Services       Precautions / Restrictions Precautions Precautions: Fall Restrictions Weight Bearing Restrictions: No      Mobility  Bed Mobility Overal bed mobility: Needs Assistance Bed Mobility: Supine to Sit;Sit to Supine     Supine to sit: Modified independent (Device/Increase time) Sit to supine: Modified independent (Device/Increase time)   General bed mobility comments: incr time  Transfers Overall transfer level: Needs assistance Equipment used: Rolling walker (2 wheeled) Transfers: Sit to/from Stand Sit to Stand: Supervision;Min guard         General transfer comment: for safety  Ambulation/Gait Ambulation/Gait assistance: Min guard Gait Distance (Feet): 360 Feet Assistive device: Rolling walker (2 wheeled) Gait Pattern/deviations: Step-through pattern;Decreased stride length     General Gait Details: cues for RW position (pt normally amb with rollator) fatigued after distance with HR  max of 150, no overt LOB, steady with UE support  Stairs            Wheelchair Mobility    Modified Rankin (Stroke Patients Only)       Balance Overall balance assessment: Needs assistance   Sitting balance-Leahy Scale: Good       Standing balance-Leahy Scale: Fair Standing balance comment: able to maintain static standing without  UE support                             Pertinent Vitals/Pain Pain Assessment: No/denies pain    Home Living Family/patient expects to be discharged to:: Private residence Living Arrangements: Other (Comment) Available Help at Discharge: Home health;Personal care attendant Type of Home: [place]: One level Home Equipment: Walker - 4 wheels;Grab bars - tub/shower;Grab bars - toilet      Prior Function Level of Independence: Independent with assistive device(s)         Comments: mod I with rollator; has RN that helps and assists as needed, laundry, housework, meds; gets PT 1x per wk     Hand Dominance        Extremity/Trunk Assessment   Upper Extremity Assessment Upper Extremity Assessment: Overall WFL for tasks assessed    Lower Extremity Assessment Lower Extremity Assessment: Overall WFL for tasks assessed       Communication   Communication: No difficulties  Cognition Arousal/Alertness: Awake/alert Behavior During Therapy: WFL for tasks assessed/performed Overall Cognitive Status: Within Functional Limits for tasks assessed  General Comments      Exercises     Assessment/Plan    PT Assessment All further PT needs can be met in the next venue of care  PT Problem List         PT Treatment Interventions      PT Goals (Current goals can be found in the Care Plan section)  Acute Rehab PT Goals Patient Stated Goal: home soon! PT Goal Formulation: All assessment and education complete, DC therapy    Frequency      Barriers to discharge        Co-evaluation               AM-PAC PT "6 Clicks" Mobility  Outcome Measure Help needed turning from your back to your side while in a flat bed without using bedrails?: None Help needed moving from lying on your back to sitting on the side of a flat bed without using bedrails?: None Help needed moving to and from a bed to a chair (including a wheelchair)?: A Little Help needed standing up from a chair using your arms (e.g., wheelchair or bedside chair)?: A Little Help needed to walk in hospital room?: A Little Help needed climbing 3-5 steps with a railing? : A Little 6 Click Score: 20    End of Session Equipment Utilized During Treatment: Gait belt Activity Tolerance: Patient tolerated treatment well Patient left: in bed;with call bell/phone within reach;with bed alarm set Nurse Communication: Mobility status PT Visit Diagnosis: Difficulty in walking, not elsewhere classified (R26.2)    Time: 1504-1364 PT Time Calculation (min) (ACUTE ONLY): 21 min   Charges:   PT Evaluation $PT Eval Low Complexity: 1 Low          Jimi Schappert, PT   Acute Rehab Dept [facility]): [phone]   05/17/2019   [facility] 05/17/2019, 4:47 PM

## 2019-05-17 NOTE — Progress Notes (Signed)
PROGRESS NOTE    [name]    Code Status: Full Code  [id number] DOB: June 01, 1926 DOA: 05/16/2019 LOS: 1 days  PCP: Lawerance Cruel, MD CC: No chief complaint on file.      Hospital Summary   This is a 86 year old male with past medical history of atrial fibrillation, BPH, chronic HFrEF, CAD status post PCI and stents, CKD 3, hypertension, back pain with DDD, anxiety, history of prostate cancer, incomplete bladder emptying and frequent UTIs who recently finished a course of doxycycline apparently for UTI who presented with nonbloody diarrhea x4 to 5 days, generalized weakness and poor p.o. intake.  Also noted LLQ tenderness on exam and urology office 2 days ago.  In ED he was found to have T102.29F, RR 20s, HR 100s initially, BP 97/60 1 liter NS bolus, lactic acid 2.7, WBC 13.5->11.  UA contaminated, CT abdomen/pelvis negative for acute pathology, CXR with possible edema VS.  Pneumonia and right mid to lower lung.  Received a dose of p.o. vancomycin in ED for suspected C. difficile however C. difficile screen was negative.  Further antibiotics were held off on admission.  Given additional 500 cc bolus on admission.  Had significant improvement and near resolution of his symptoms on 3/21 with resolution of diarrhea and able to tolerate p.o. intake.  Lactic acid remained elevated at 2.0, T bili slightly elevated and repeat UA pending.  PT asked to evaluate patient.   A & P   Principal Problem:   Severe sepsis (HCC) Active Problems:   Coronary atherosclerosis of native coronary artery   Chronic systolic heart failure (HCC)   Malignant neoplasm of prostate (HCC)   Prostate cancer (HCC)   Atrial fibrillation, permanent (HCC)   Biventricular cardiac pacemaker in situ   1. Severe sepsis unknown etiology, suspect viral a. Sepsis resolved, now SIRS with leukocytosis and persistent but improved lactic acidosis b. Has remained afebrile despite one reading of 102 F at  presentation c. C. difficile negative d. GI panel pending e. Hold further IV fluids as patient is tolerating p.o. intake f. Repeat UA with clean-catch  2. Nonbloody diarrhea, possibly side effect from Doxycycline vs. Viral etiology -resolved a. Mildly elevated T bili (1.6), otherwise normal LFTs b. Hx of cholecystectomy and no abnormalities on CT abd/pel c. Repeat CMP in AM d. Discontinue p.o. vancomycin with negative C. difficile test e. Follow up GI panel  3. Generalized weakness likely from dehydration in setting of above - seems resolved/near baseline a. Lives alone b. PT eval  4. Acute on chronic normocytic anemia, suspect dilutional a. Check differential  5. Thrush Started nystatin this admission  6. BPH with history of prostate cancer on flomax  7. Chronic HFrEF appears euvolemic today a. Personal read of CXR with fluid in right fissure indicating mild volume overload b. Tolerating room air  c. Restart home Demadex   8. CKD 3a, stable  9. CAD/Aortoiliac Atherosclerosis a. Not on statin b. Check lipid panel  DVT prophylaxis: Lovenox Family Communication: Patient's daughter-in-law has been updated  Disposition Plan:   Patient came from:   Home  Anticipated d/c place: Home with home PT, as HH PT  Barriers to d/c: Continue observation for another 24 hours to rule out recurrent fever, follow-up lab work.  Likely discharge tomorrow morning if he continues to remain afebrile without recurrent diarrhea/generalized weakness.  Need to follow-up on GI panel  Pressure injury documentation    None none  Consultants  None  Procedures  None  Antibiotics   Anti-infectives (From admission, onward)   Start     Dose/Rate Route Frequency Ordered Stop   05/16/19 1100  cefTRIAXone (ROCEPHIN) 2 g in sodium chloride 0.9 % 100 mL IVPB  Status:  Discontinued     2 g 200 mL/hr over 30  Minutes Intravenous  Once 05/16/19 1052 05/16/19 1100   05/16/19 1100  azithromycin (ZITHROMAX) 500 mg in sodium chloride 0.9 % 250 mL IVPB  Status:  Discontinued     500 mg 250 mL/hr over 60 Minutes Intravenous  Once 05/16/19 1052 05/16/19 1100   05/16/19 1000  vancomycin (VANCOCIN) 50 mg/mL oral solution 125 mg     125 mg Oral 4 times daily 05/16/19 [id number] 05/26/19 0959        Subjective   Patient seen and examined at bedside in no acute distress and resting comfortably. No acute events overnight. Denies any acute complaints at this time. Ambulating. Tolerating diet well.  Very pleasant patient.  Objective   Vitals:   05/16/19 1804 05/16/19 2013 05/17/19 0630 05/17/19 1251  BP: (!) 122/91 123/63 133/69 108/69  Pulse: 82 80 76 76  Resp: 20 20 20    Temp: 98 F (36.7 C) (!) 97.5 F (36.4 C) 97.8 F (36.6 C) 97.7 F (36.5 C)  TempSrc: Oral  Oral Oral  SpO2: 92% 96% 95% 93%  Weight: 81.1 kg     Height:        Intake/Output Summary (Last 24 hours) at 05/17/2019 1601 Last data filed at 05/17/2019 1343 Gross per 24 hour  Intake 1080 ml  Output 125 ml  Net 955 ml   Filed Weights   05/16/19 0817 05/16/19 1804  Weight: 81.6 kg 81.1 kg    Examination:  Physical Exam Vitals and nursing note reviewed.  Constitutional:      Appearance: Normal appearance.  HENT:     Head: Normocephalic and atraumatic.  Eyes:     Conjunctiva/sclera: Conjunctivae normal.  Cardiovascular:     Rate and Rhythm: Normal rate and regular rhythm.  Pulmonary:     Effort: Pulmonary effort is normal.     Breath sounds: Normal breath sounds.  Abdominal:     General: Abdomen is flat.     Palpations: Abdomen is soft.  Musculoskeletal:        General: No swelling or tenderness.  Skin:    Coloration: Skin is not jaundiced or pale.  Neurological:     Mental Status: He is alert. Mental status is at baseline.  Psychiatric:        Mood and Affect: Mood normal.        Behavior: Behavior normal.      Data Reviewed: I have personally reviewed following labs and imaging studies  CBC: Recent Labs  Lab 05/16/19 0844 05/16/19 1913 05/17/19 0350  WBC 11.0* 13.5* 11.7*  NEUTROABS 8.6*  --   --   HGB 14.1 13.0 12.2*  HCT 42.4 39.6 37.1*  MCV 93.6 95.2 94.6  PLT 150 146* [id number]*   Basic Metabolic Panel: Recent Labs  Lab 05/16/19 0844 05/16/19 0907 05/16/19 1913 05/17/19 0350  NA 136  --  138 136  K 3.8  --  3.9 3.7  CL 102  --  104 105  CO2 25  --  27 24  GLUCOSE 121*  --  136* 118*  BUN 18  --  16 16  CREATININE 1.23 1.39* 1.23 1.07  CALCIUM 8.7*  --  8.5* 8.3*  MG 1.9  --   --   --    GFR: Estimated Creatinine Clearance: 46.8 mL/min (by C-G formula based on SCr of 1.07 mg/dL). Liver Function Tests: Recent Labs  Lab 05/16/19 0844 05/16/19 1913 05/17/19 0350  AST 22 18 16   ALT 19 17 15   ALKPHOS 92 75 65  BILITOT 0.8 1.3* 1.6*  PROT 6.0* 5.7* 5.2*  ALBUMIN 3.8 3.4* 3.2*   No results for input(s): LIPASE, AMYLASE in the last 168 hours. No results for input(s): AMMONIA in the last 168 hours. Coagulation Profile: Recent Labs  Lab 05/16/19 0844  INR 1.1   Cardiac Enzymes: No results for input(s): CKTOTAL, CKMB, CKMBINDEX, TROPONINI in the last 168 hours. BNP (last 3 results) No results for input(s): PROBNP in the last 8760 hours. HbA1C: No results for input(s): HGBA1C in the last 72 hours. CBG: No results for input(s): GLUCAP in the last 168 hours. Lipid Profile: No results for input(s): CHOL, HDL, LDLCALC, TRIG, CHOLHDL, LDLDIRECT in the last 72 hours. Thyroid Function Tests: No results for input(s): TSH, T4TOTAL, FREET4, T3FREE, THYROIDAB in the last 72 hours. Anemia Panel: No results for input(s): VITAMINB12, FOLATE, FERRITIN, TIBC, IRON, RETICCTPCT in the last 72 hours. Sepsis Labs: Recent Labs  Lab 05/16/19 0844 05/16/19 1047 05/16/19 1913 05/17/19 1102  LATICACIDVEN 2.7* 2.6* 2.0* 2.0*    Recent Results (from the past 240 hour(s))   Blood Culture (routine x 2)     Status: None (Preliminary result)   Collection Time: 05/16/19  8:47 AM   Specimen: BLOOD  Result Value Ref Range Status   Specimen Description   Final    BLOOD LEFT WRIST Performed at [facility] [address] [place], [place] [address]    Special Requests   Final    BOTTLES DRAWN AEROBIC AND ANAEROBIC Blood Culture results may not be optimal due to an inadequate volume of blood received in culture bottles Performed at [facility] [address] [place], [place] [address]    Culture   Final    NO GROWTH < 24 HOURS Performed at [facility] [address] [place], [place] [address]    Report Status PENDING  Incomplete  Blood Culture (routine x 2)     Status: None (Preliminary result)   Collection Time: 05/16/19  8:47 AM   Specimen: Right Antecubital; Blood  Result Value Ref Range Status   Specimen Description   Final    RIGHT ANTECUBITAL Performed at [facility] [address], [place], [place] [address]    Special Requests   Final    BOTTLES DRAWN AEROBIC AND ANAEROBIC Blood Culture adequate volume Performed at [facility] [address], [place], [place] [address]    Culture   Final    NO GROWTH < 24 HOURS Performed at [facility] [address], [place], [place] [address]    Report Status PENDING  Incomplete  Urine culture     Status: Abnormal (Preliminary result)   Collection Time: 05/16/19 11:51 AM   Specimen: In/Out Cath Urine  Result Value Ref Range  Status   Specimen Description IN/OUT CATH URINE  Final   Special Requests   Final    NONE Performed at [facility] [address] [place], [place] [address]    Culture >=100,000 COLONIES/mL SERRATIA MARCESCENS (A)  Final   Report Status PENDING  Incomplete  SARS CORONAVIRUS 2 (TAT 6-24 HRS) Nasopharyngeal Urine, Clean Catch     Status: None   Collection Time:  05/16/19 11:51 AM   Specimen: Urine, Clean Catch; Nasopharyngeal  Result Value Ref Range Status   SARS Coronavirus 2 NEGATIVE NEGATIVE Final    Comment: (NOTE) SARS-CoV-2 target nucleic acids are NOT DETECTED. The SARS-CoV-2 RNA is generally detectable in upper and lower respiratory specimens during the acute phase of infection. Negative results do not preclude SARS-CoV-2 infection, do not rule out co-infections with other pathogens, and should not be used as the sole basis for treatment or other patient management decisions. Negative results must be combined with clinical observations, patient history, and epidemiological information. The expected result is Negative. Fact Sheet for Patients: SugarRoll.be Fact Sheet for Healthcare Providers: https://www.woods-mathews.com/ This test is not yet approved or cleared by the [place] FDA and  has been authorized for detection and/or diagnosis of SARS-CoV-2 by FDA under an Emergency Use Authorization (EUA). This EUA will remain  in effect (meaning this test can be used) for the duration of the COVID-19 declaration under Section 56 4(b)(1) of the Act, 21 U.S.C. section 360bbb-3(b)(1), unless the authorization is terminated or revoked sooner. Performed at [facility], [place] [address], [place], [place] [address]   C difficile quick scan w PCR reflex     Status: None   Collection Time: 05/16/19  5:40 PM   Specimen: Stool  Result Value Ref Range Status   C Diff antigen NEGATIVE NEGATIVE Final   C Diff toxin NEGATIVE NEGATIVE Final   C Diff interpretation No C. difficile detected.  Final    Comment: Performed at [facility], [place] [address], [place], [place] [address]         Radiology Studies: CT ABDOMEN PELVIS W CONTRAST  Result Date: 05/16/2019 CLINICAL DATA:  Daughter in law at bedside C/o diarrhea, fatigue, weakness "I can't hold myself up, I can't control my  bowels" Patient diagnosed with CDIFF yesterday per daughter in law Per patient "he is having some RLQ pain" ^ EXAM: CT ABDOMEN AND PELVIS WITH CONTRAST TECHNIQUE: Multidetector CT imaging of the abdomen and pelvis was performed using the standard protocol following bolus administration of intravenous contrast. CONTRAST:  149mL OMNIPAQUE IOHEXOL 300 MG/ML  SOLN COMPARISON:  11/02/2015 FINDINGS: Lower chest: No pleural or pericardial effusion. Mild cardiomegaly. Transvenous pacing leads partially visualized. Coronary calcifications/stents. Hepatobiliary: No focal liver abnormality is seen. Status post cholecystectomy. No biliary dilatation. Pancreas: Diffuse atrophy without mass or ductal dilatation. Spleen: Normal in size without focal abnormality. Adrenals/Urinary Tract: Adrenals unremarkable. Kidneys enhance normally, without mass or hydronephrosis. Urinary bladder physiologically distended. Stomach/Bowel: Stomach decompressed Small bowel is nondistended Appendix not discretely identified The colon has scattered diverticula throughout, without significant adjacent inflammatory/edematous change or abscess Vascular/Lymphatic: Extensive aortoiliac atherosclerosis (ICD10-170.0) without aneurysm. No abdominal or pelvic adenopathy. Portal vein patent. Reproductive: Penile prosthesis components partially visualized, without apparent complication. Other: No ascites. No free air. Musculoskeletal: Bilateral hip arthroplasty hardware, resulting in significant streak artifact degrading portions of the scan. Multilevel lumbar spondylitic changes with dextroscoliosis apex L2-3. Probable bone island in the superior left pubic ramus, stable. No acute fracture or  worrisome bone lesion. IMPRESSION: 1. No acute findings. 2. Colonic diverticulosis. Aortic Atherosclerosis (ICD10-I70.0). Electronically Signed   By: Lucrezia Europe M.D.   On: 05/16/2019 10:08   DG Chest Portable 1 View  Result Date: 05/16/2019 CLINICAL DATA:  Shortness of  breath. Additional history provided: Diarrhea, fatigue, weakness, shortness of breath for 2 weeks worsening last night, diagnosed with C diff yesterday, medical history of cancer, CHF, hypertension, GERD, CAD, CKD. EXAM: PORTABLE CHEST 1 VIEW COMPARISON:  Chest radiograph 08/10/2017 FINDINGS: Redemonstrated left chest AICD. Unchanged cardiomegaly. Aortic atherosclerosis. Subtle asymmetric ill-defined opacity within the right mid to lower lung field. The left lung is clear. No evidence of pleural effusion or pneumothorax. No acute bony abnormality. IMPRESSION: Subtle asymmetric ill-defined opacity within the right mid to lower lung field which may reflect asymmetric edema. Early pneumonia cannot be excluded. Unchanged cardiomegaly with aortic atherosclerosis. Electronically Signed   By: Kellie Simmering DO   On: 05/16/2019 09:17        Scheduled Meds: . brimonidine  1 drop Both Eyes BID  . clopidogrel  75 mg Oral Daily  . diclofenac  1 patch Transdermal BID  . dorzolamide-timolol  1 drop Both Eyes BID  . enoxaparin (LOVENOX) injection  40 mg Subcutaneous Q24H  . multivitamin  1 tablet Oral Daily  . nystatin  5 mL Oral QID  . pantoprazole  40 mg Oral Daily  . sodium chloride flush  3 mL Intravenous Q12H  . tamsulosin  0.4 mg Oral Daily  . vancomycin  125 mg Oral QID   Continuous Infusions: . sodium chloride       Time spent: 26 minutes with over 50% of the time coordinating the patient's care    Harold Hedge, DO [facility] Pager [phone]  Call night coverage person covering after 7pm

## 2019-05-17 NOTE — Progress Notes (Signed)
Pt up to chair with family at bedside. No needs at this time. No changes in pt overall condition. Rn will continue to monitor.

## 2019-05-18 ENCOUNTER — Ambulatory Visit (INDEPENDENT_AMBULATORY_CARE_PROVIDER_SITE_OTHER): Payer: Medicare HMO

## 2019-05-18 DIAGNOSIS — Z950 Presence of cardiac pacemaker: Secondary | ICD-10-CM

## 2019-05-18 DIAGNOSIS — I5022 Chronic systolic (congestive) heart failure: Secondary | ICD-10-CM

## 2019-05-18 LAB — GI PATHOGEN PANEL BY PCR, STOOL

## 2019-05-18 LAB — CBC WITH DIFFERENTIAL/PLATELET
Abs Immature Granulocytes: 0.05 10*3/uL (ref 0.00–0.07)
Basophils Absolute: 0 10*3/uL (ref 0.0–0.1)
Basophils Relative: 0 %
Eosinophils Absolute: 0.2 10*3/uL (ref 0.0–0.5)
Eosinophils Relative: 3 %
HCT: 37.3 % — ABNORMAL LOW (ref 39.0–52.0)
Hemoglobin: 12.4 g/dL — ABNORMAL LOW (ref 13.0–17.0)
Immature Granulocytes: 1 %
Lymphocytes Relative: 26 %
Lymphs Abs: 2.2 10*3/uL (ref 0.7–4.0)
MCH: 31.2 pg (ref 26.0–34.0)
MCHC: 33.2 g/dL (ref 30.0–36.0)
MCV: 93.7 fL (ref 80.0–100.0)
Monocytes Absolute: 1 10*3/uL (ref 0.1–1.0)
Monocytes Relative: 11 %
Neutro Abs: 5.1 10*3/uL (ref 1.7–7.7)
Neutrophils Relative %: 59 %
Platelets: 141 10*3/uL — ABNORMAL LOW (ref 150–400)
RBC: 3.98 MIL/uL — ABNORMAL LOW (ref 4.22–5.81)
RDW: 14.1 % (ref 11.5–15.5)
WBC: 8.6 10*3/uL (ref 4.0–10.5)
nRBC: 0 % (ref 0.0–0.2)

## 2019-05-18 LAB — COMPREHENSIVE METABOLIC PANEL
ALT: 15 U/L (ref 0–44)
AST: 16 U/L (ref 15–41)
Albumin: 3.2 g/dL — ABNORMAL LOW (ref 3.5–5.0)
Alkaline Phosphatase: 71 U/L (ref 38–126)
Anion gap: 8 (ref 5–15)
BUN: 19 mg/dL (ref 8–23)
CO2: 25 mmol/L (ref 22–32)
Calcium: 8.4 mg/dL — ABNORMAL LOW (ref 8.9–10.3)
Chloride: 103 mmol/L (ref 98–111)
Creatinine, Ser: 1.32 mg/dL — ABNORMAL HIGH (ref 0.61–1.24)
GFR calc Af Amer: 55 mL/min — ABNORMAL LOW (ref >60)
GFR calc non Af Amer: 47 mL/min — ABNORMAL LOW (ref >60)
Glucose, Bld: 109 mg/dL — ABNORMAL HIGH (ref 70–99)
Potassium: 3.8 mmol/L (ref 3.5–5.1)
Sodium: 136 mmol/L (ref 135–145)
Total Bilirubin: 0.9 mg/dL (ref 0.3–1.2)
Total Protein: 5.4 g/dL — ABNORMAL LOW (ref 6.5–8.1)

## 2019-05-18 LAB — LIPID PANEL
Cholesterol: 168 mg/dL (ref 0–200)
HDL: 36 mg/dL — ABNORMAL LOW (ref >40)
LDL Cholesterol: 116 mg/dL — ABNORMAL HIGH (ref 0–99)
Total CHOL/HDL Ratio: 4.7 RATIO
Triglycerides: 79 mg/dL (ref <150)
VLDL: 16 mg/dL (ref 0–40)

## 2019-05-18 LAB — URINE CULTURE: Culture: >=100000 — AB

## 2019-05-18 MED ORDER — SULFAMETHOXAZOLE-TRIMETHOPRIM 800-160 MG PO TABS: 1.0000 | ORAL_TABLET | Freq: Two times a day (BID) | ORAL | 0 refills | Status: AC

## 2019-05-18 MED ORDER — NYSTATIN 100000 UNIT/ML MT SUSP: 5.0000 mL | Freq: Four times a day (QID) | OROMUCOSAL | 0 refills | Status: DC

## 2019-05-18 MED ORDER — SULFAMETHOXAZOLE-TRIMETHOPRIM 800-160 MG PO TABS
1.0000 | ORAL_TABLET | Freq: Two times a day (BID) | ORAL | Status: DC
  Administered 2019-05-18: 1 via ORAL
  Filled 2019-05-18: qty 1

## 2019-05-18 NOTE — Discharge Summary (Signed)
Physician Discharge Summary  [name] [id number] DOB: 1927-02-26   PCP: Lawerance Cruel, MD  Admit date: 05/16/2019 Discharge date: 05/18/2019 Length of Stay: 2 days   Code Status: Full Code  Admitted From:  Home Discharged to:   [place]: Has [facility] PT  Equipment/Devices:  None Discharge Condition:  Stable  Recommendations for Outpatient Follow-up   1. Follow up with urology in 1 week 2. Follow-up with PCP, follow-up on GI panel which is still pending at discharge 3. Consider adding on statin with hyperlipidemia and aortic atherosclerosis and CAD 4. Monitor for C. difficile as he had 1 week of doxycycline now another week of Bactrim  Hospital Summary   This is a 86 year old male with past medical history of atrial fibrillation, BPH, chronic HFrEF, CAD status post PCI and stents, CKD 3, hypertension, back pain with DDD, anxiety, history of prostate cancer, incomplete bladder emptying and frequent UTIs who recently finished a course of doxycycline apparently for UTI who presented with nonbloody diarrhea x4 to 5 days, generalized weakness and poor p.o. intake.  Also noted LLQ tenderness on exam and urology office 2 days ago.  In ED he was found to have T102.47F, RR 20s, HR 100s initially, BP 97/60 1 liter NS bolus, lactic acid 2.7, WBC 13.5->11.  First UA contaminated, CT abdomen/pelvis negative for acute pathology, CXR with possible edema VS.  Pneumonia and right mid to lower lung.  Received a dose of p.o. vancomycin in ED for suspected C. difficile however C. difficile screen was negative.  Further antibiotics were held off on admission.  Given additional 500 cc bolus on admission.  Had significant improvement and near resolution of his symptoms on 3/21 with resolution of diarrhea and able to tolerate p.o. intake.  Lactic acid remained elevated at 2.0, T bili slightly elevated and repeat UA was positive for UTI, culture growing Serratia marcescens.    Patient was  discharged on 3/22 with Bactrim twice daily x7 days and recommended follow-up with his urologist and PCP.  A & P   Principal Problem:   Severe sepsis (HCC) Active Problems:   Coronary atherosclerosis of native coronary artery   Chronic systolic heart failure (HCC)   Malignant neoplasm of prostate (HCC)   Prostate cancer (HCC)   Atrial fibrillation, permanent (HCC)   Biventricular cardiac pacemaker in situ   1. Severe sepsis unknown etiology secondary to Serratia marcescens UTI, sepsis and SIRS resolved a. C. difficile negative b. GI panel still pending c. Discharged on Bactrim twice daily x7 days with outpatient follow-up  2. Nonbloody diarrhea, possibly side effect from Doxycycline vs. Viral etiology -resolved a. Received 1 day p.o. vancomycin for suspected but ruled out C. difficile b. Follow up GI panel  3. Generalized weakness likely from dehydration in setting of above - resolved/near baseline a. Lives alone b. PT eval recommending continuing current home health PT no new orders  4. Acute on chronic normocytic anemia, suspect dilutional a. Stable, continue to follow outpatient  5. Thrush Started nystatin this admission continued at discharge  6. BPH with history of prostate cancer on flomax  7. Chronic HFrEF  a. Personal read of CXR with fluid in right fissure indicating mild volume overload and remained without O2 requirements b. Restart home Demadex   8. CKD 3a, stable  9. Hyperlipidemia/CAD/Aortoiliac Atherosclerosis a. Not on statin b. Consider starting statin as outpatient    Consultants  . none  Procedures  . None  Antibiotics   Anti-infectives (From  admission, onward)   Start     Dose/Rate Route Frequency Ordered Stop   05/18/19 1000  sulfamethoxazole-trimethoprim (BACTRIM DS) 800-160 MG per tablet 1 tablet     1 tablet Oral Every 12 hours 05/18/19 0748 05/25/19 0959   05/18/19 0000  sulfamethoxazole-trimethoprim (BACTRIM DS) 800-160 MG  tablet     1 tablet Oral Every 12 hours 05/18/19 0942 05/25/19 2359   05/16/19 1100  cefTRIAXone (ROCEPHIN) 2 g in sodium chloride 0.9 % 100 mL IVPB  Status:  Discontinued     2 g 200 mL/hr over 30 Minutes Intravenous  Once 05/16/19 1052 05/16/19 1100   05/16/19 1100  azithromycin (ZITHROMAX) 500 mg in sodium chloride 0.9 % 250 mL IVPB  Status:  Discontinued     500 mg 250 mL/hr over 60 Minutes Intravenous  Once 05/16/19 1052 05/16/19 1100   05/16/19 1000  vancomycin (VANCOCIN) 50 mg/mL oral solution 125 mg  Status:  Discontinued     125 mg Oral 4 times daily 05/16/19 [id number] 05/17/19 1611       Subjective  This is a very pleasant 86 year old male.  Patient seen and examined at bedside no acute distress and resting comfortably.  No events overnight.  Tolerating diet. In good spirits and anticipating discharge.   Denies any chest pain, shortness of breath, fever, nausea, vomiting, urinary or bowel complaints. Otherwise ROS negative    Objective   Discharge Exam: Vitals:   05/17/19 2019 05/18/19 0628  BP: 105/72 107/66  Pulse: 80 79  Resp: 20 20  Temp: 97.9 F (36.6 C) (!) 97.4 F (36.3 C)  SpO2: 97% 95%   Vitals:   05/17/19 0630 05/17/19 1251 05/17/19 2019 05/18/19 0628  BP: 133/69 108/69 105/72 107/66  Pulse: 76 76 80 79  Resp: 20  20 20   Temp: 97.8 F (36.6 C) 97.7 F (36.5 C) 97.9 F (36.6 C) (!) 97.4 F (36.3 C)  TempSrc: Oral Oral    SpO2: 95% 93% 97% 95%  Weight:      Height:        Physical Exam Vitals and nursing note reviewed.  Constitutional:      Appearance: Normal appearance.  HENT:     Head: Normocephalic and atraumatic.  Eyes:     Conjunctiva/sclera: Conjunctivae normal.  Cardiovascular:     Rate and Rhythm: Normal rate and regular rhythm.  Pulmonary:     Effort: Pulmonary effort is normal.     Breath sounds: Normal breath sounds.  Abdominal:     General: Abdomen is flat. There is no distension.     Palpations: Abdomen is soft.   Musculoskeletal:        General: No swelling or tenderness.  Skin:    Coloration: Skin is not jaundiced or pale.  Neurological:     Mental Status: He is alert. Mental status is at baseline.  Psychiatric:        Mood and Affect: Mood normal.        Behavior: Behavior normal.       The results of significant diagnostics from this hospitalization (including imaging, microbiology, ancillary and laboratory) are listed below for reference.     Microbiology: Recent Results (from the past 240 hour(s))  Blood Culture (routine x 2)     Status: None (Preliminary result)   Collection Time: 05/16/19  8:47 AM   Specimen: BLOOD  Result Value Ref Range Status   Specimen Description   Final    BLOOD LEFT WRIST Performed at [facility]  [place] [address], [place], [place] [address]    Special Requests   Final    BOTTLES DRAWN AEROBIC AND ANAEROBIC Blood Culture results may not be optimal due to an inadequate volume of blood received in culture bottles Performed at [facility] [address] [place], [place] [address]    Culture   Final    NO GROWTH 2 DAYS Performed at [facility] [address] [place], [place] [address]    Report Status PENDING  Incomplete  Blood Culture (routine x 2)     Status: None (Preliminary result)   Collection Time: 05/16/19  8:47 AM   Specimen: Right Antecubital; Blood  Result Value Ref Range Status   Specimen Description   Final    RIGHT ANTECUBITAL Performed at [facility] [address] [place], [place] [address]    Special Requests   Final    BOTTLES DRAWN AEROBIC AND ANAEROBIC Blood Culture adequate volume Performed at [facility] [address] [place], [place] [address]    Culture   Final    NO GROWTH 2 DAYS Performed at [facility] [address] [place], [place] [address]    Report Status PENDING  Incomplete  Urine culture     Status: Abnormal    Collection Time: 05/16/19 11:51 AM   Specimen: In/Out Cath Urine  Result Value Ref Range Status   Specimen Description IN/OUT CATH URINE  Final   Special Requests   Final    NONE Performed at [facility] [address], [place], [place] [address]    Culture >=100,000 COLONIES/mL SERRATIA MARCESCENS (A)  Final   Report Status 05/18/2019 FINAL  Final   Organism ID, Bacteria SERRATIA MARCESCENS (A)  Final      Susceptibility   Serratia marcescens - MIC*    CEFAZOLIN >=64 RESISTANT Resistant     CEFTRIAXONE <=0.25 SENSITIVE Sensitive     CIPROFLOXACIN <=0.25 SENSITIVE Sensitive     GENTAMICIN <=1 SENSITIVE Sensitive     NITROFURANTOIN 128 RESISTANT Resistant     TRIMETH/SULFA <=20 SENSITIVE Sensitive     * >=100,000 COLONIES/mL SERRATIA MARCESCENS  SARS CORONAVIRUS 2 (TAT 6-24 HRS) Nasopharyngeal Urine, Clean Catch     Status: None   Collection Time: 05/16/19 11:51 AM   Specimen: Urine, Clean Catch; Nasopharyngeal  Result Value Ref Range Status   SARS Coronavirus 2 NEGATIVE NEGATIVE Final    Comment: (NOTE) SARS-CoV-2 target nucleic acids are NOT DETECTED. The SARS-CoV-2 RNA is generally detectable in upper and lower respiratory specimens during the acute phase of infection. Negative results do not preclude SARS-CoV-2 infection, do not rule out co-infections with other pathogens, and should not be used as the sole basis for treatment or other patient management decisions. Negative results must be combined with clinical observations, patient history, and epidemiological information. The expected result is Negative. Fact Sheet for Patients: SugarRoll.be Fact Sheet for Healthcare Providers: https://www.woods-mathews.com/ This test is not yet approved or cleared by the [place] FDA and  has been authorized for detection and/or diagnosis of SARS-CoV-2 by FDA under an Emergency Use Authorization (EUA). This EUA will  remain  in effect (meaning this test can be used) for the duration of the COVID-19 declaration under Section 56 4(b)(1) of the Act, 21 U.S.C. section 360bbb-3(b)(1), unless the authorization is terminated or revoked sooner. Performed at [facility], [place] [address], [place], [place] [address]  Culture, Urine     Status: Abnormal (Preliminary result)   Collection Time: 05/16/19  1:56 PM   Specimen: Urine, Clean Catch  Result Value Ref Range Status   Specimen Description   Final    URINE, CLEAN CATCH Performed at [facility], [place] [address] [place], [place] [address]    Special Requests   Final    NONE Performed at [facility], [place] [address] [place], [place] [address]    Culture (A)  Final    >=100,000 COLONIES/mL SERRATIA MARCESCENS SUSCEPTIBILITIES TO FOLLOW Performed at [facility], [place] [address], [place], [place] [address]    Report Status PENDING  Incomplete  C difficile quick scan w PCR reflex     Status: None   Collection Time: 05/16/19  5:40 PM   Specimen: Stool  Result Value Ref Range Status   C Diff antigen NEGATIVE NEGATIVE Final   C Diff toxin NEGATIVE NEGATIVE Final   C Diff interpretation No C. difficile detected.  Final    Comment: Performed at [facility], [place] [address], [place],  [address]     Labs: BNP (last 3 results) No results for input(s): BNP in the last 8760 hours. Basic Metabolic Panel: Recent Labs  Lab 05/16/19 0844 05/16/19 0907 05/16/19 1913 05/17/19 0350 05/18/19 0350  NA 136  --  138 136 136  K 3.8  --  3.9 3.7 3.8  CL 102  --  104 105 103  CO2 25  --  27 24 25   GLUCOSE 121*  --  136* 118* 109*  BUN 18  --  16 16 19   CREATININE 1.23 1.39* 1.23 1.07 1.32*  CALCIUM 8.7*  --  8.5* 8.3* 8.4*  MG 1.9  --   --   --   --    Liver Function Tests: Recent Labs  Lab 05/16/19 0844 05/16/19 1913 05/17/19 0350 05/18/19 0350  AST 22 18 16 16   ALT 19 17 15  15   ALKPHOS 92 75 65 71  BILITOT 0.8 1.3* 1.6* 0.9  PROT 6.0* 5.7* 5.2* 5.4*  ALBUMIN 3.8 3.4* 3.2* 3.2*   No results for input(s): LIPASE, AMYLASE in the last 168 hours. No results for input(s): AMMONIA in the last 168 hours. CBC: Recent Labs  Lab 05/16/19 0844 05/16/19 1913 05/17/19 0350 05/18/19 0350  WBC 11.0* 13.5* 11.7* 8.6  NEUTROABS 8.6*  --   --  5.1  HGB 14.1 13.0 12.2* 12.4*  HCT 42.4 39.6 37.1* 37.3*  MCV 93.6 95.2 94.6 93.7  PLT 150 146* 140* 141*   Cardiac Enzymes: No results for input(s): CKTOTAL, CKMB, CKMBINDEX, TROPONINI in the last 168 hours. BNP: Invalid input(s): POCBNP CBG: No results for input(s): GLUCAP in the last 168 hours. D-Dimer No results for input(s): DDIMER in the last 72 hours. Hgb A1c No results for input(s): HGBA1C in the last 72 hours. Lipid Profile Recent Labs    05/18/19 0350  CHOL 168  HDL 36*  LDLCALC 116*  TRIG 79  CHOLHDL 4.7   Thyroid function studies No results for input(s): TSH, T4TOTAL, T3FREE, THYROIDAB in the last 72 hours.  Invalid input(s): FREET3 Anemia work up No results for input(s): VITAMINB12, FOLATE, FERRITIN, TIBC, IRON, RETICCTPCT in the last 72 hours. Urinalysis    Component Value Date/Time   COLORURINE YELLOW 05/17/2019 1602   APPEARANCEUR CLOUDY (A) 05/17/2019 1602   LABSPEC 1.006 05/17/2019 1602   PHURINE 6.0 05/17/2019 1602   GLUCOSEU  NEGATIVE 05/17/2019 1602   HGBUR MODERATE (A) 05/17/2019 1602   BILIRUBINUR NEGATIVE 05/17/2019 1602   KETONESUR NEGATIVE 05/17/2019 1602   PROTEINUR NEGATIVE 05/17/2019 1602   UROBILINOGEN 0.2 01/01/2015 1207   NITRITE NEGATIVE 05/17/2019 1602   LEUKOCYTESUR LARGE (A) 05/17/2019 1602   Sepsis Labs Invalid input(s): PROCALCITONIN,  WBC,  LACTICIDVEN Microbiology Recent Results (from the past 240 hour(s))  Blood Culture (routine x 2)     Status: None (Preliminary result)   Collection Time: 05/16/19  8:47 AM   Specimen: BLOOD  Result Value Ref Range Status    Specimen Description   Final    BLOOD LEFT WRIST Performed at [facility] [address], [place], [place] [address]    Special Requests   Final    BOTTLES DRAWN AEROBIC AND ANAEROBIC Blood Culture results may not be optimal due to an inadequate volume of blood received in culture bottles Performed at [facility] [address], [place], [place] [address]    Culture   Final    NO GROWTH 2 DAYS Performed at [facility] [address], [place], [place] [address]    Report Status PENDING  Incomplete  Blood Culture (routine x 2)     Status: None (Preliminary result)   Collection Time: 05/16/19  8:47 AM   Specimen: Right Antecubital; Blood  Result Value Ref Range Status   Specimen Description   Final    RIGHT ANTECUBITAL Performed at [facility] [address], [place], [place] [address]    Special Requests   Final    BOTTLES DRAWN AEROBIC AND ANAEROBIC Blood Culture adequate volume Performed at [facility] [address], [place], [place] [address]    Culture   Final    NO GROWTH 2 DAYS Performed at [facility] [address] [place], [place] [address]    Report Status PENDING  Incomplete  Urine culture     Status: Abnormal   Collection Time: 05/16/19 11:51 AM   Specimen: In/Out Cath Urine  Result Value Ref Range Status   Specimen Description IN/OUT CATH URINE  Final   Special Requests   Final    NONE Performed at [facility] [address], [place],  [address]    Culture >=100,000 COLONIES/mL SERRATIA MARCESCENS (A)  Final   Report Status 05/18/2019 FINAL  Final   Organism ID, Bacteria SERRATIA MARCESCENS (A)  Final      Susceptibility   Serratia marcescens - MIC*    CEFAZOLIN >=64 RESISTANT Resistant     CEFTRIAXONE <=0.25 SENSITIVE Sensitive     CIPROFLOXACIN <=0.25 SENSITIVE Sensitive     GENTAMICIN <=1 SENSITIVE Sensitive      NITROFURANTOIN 128 RESISTANT Resistant     TRIMETH/SULFA <=20 SENSITIVE Sensitive     * >=100,000 COLONIES/mL SERRATIA MARCESCENS  SARS CORONAVIRUS 2 (TAT 6-24 HRS) Nasopharyngeal Urine, Clean Catch     Status: None   Collection Time: 05/16/19 11:51 AM   Specimen: Urine, Clean Catch; Nasopharyngeal  Result Value Ref Range Status   SARS Coronavirus 2 NEGATIVE NEGATIVE Final    Comment: (NOTE) SARS-CoV-2 target nucleic acids are NOT DETECTED. The SARS-CoV-2 RNA is generally detectable in upper and lower respiratory specimens during the acute phase of infection. Negative results do not preclude SARS-CoV-2 infection, do not rule out co-infections with other pathogens, and should not be used as the sole basis for treatment or other patient management decisions. Negative  results must be combined with clinical observations, patient history, and epidemiological information. The expected result is Negative. Fact Sheet for Patients: SugarRoll.be Fact Sheet for Healthcare Providers: https://www.woods-mathews.com/ This test is not yet approved or cleared by the [place] FDA and  has been authorized for detection and/or diagnosis of SARS-CoV-2 by FDA under an Emergency Use Authorization (EUA). This EUA will remain  in effect (meaning this test can be used) for the duration of the COVID-19 declaration under Section 56 4(b)(1) of the Act, 21 U.S.C. section 360bbb-3(b)(1), unless the authorization is terminated or revoked sooner. Performed at [facility], [place] [address], [place], [place] [address]   Culture, Urine     Status: Abnormal (Preliminary result)   Collection Time: 05/16/19  1:56 PM   Specimen: Urine, Clean Catch  Result Value Ref Range Status   Specimen Description   Final    URINE, CLEAN CATCH Performed at [facility], [place] [address] [place], [place] [address]    Special Requests   Final    NONE Performed at  [facility], [place] [address], [place], [place] [address]    Culture (A)  Final    >=100,000 COLONIES/mL SERRATIA MARCESCENS SUSCEPTIBILITIES TO FOLLOW Performed at [facility], [place] [address] [place], [place] [address]    Report Status PENDING  Incomplete  C difficile quick scan w PCR reflex     Status: None   Collection Time: 05/16/19  5:40 PM   Specimen: Stool  Result Value Ref Range Status   C Diff antigen NEGATIVE NEGATIVE Final   C Diff toxin NEGATIVE NEGATIVE Final   C Diff interpretation No C. difficile detected.  Final    Comment: Performed at [facility], [place] [address], [place], [place] [address]    Discharge Instructions     Discharge Instructions    Diet - low sodium heart healthy   Complete by: As directed    Discharge instructions   Complete by: As directed    - start Bactrim twice daily for total 7 days for UTI treatment - Follow up with your Urologist within one week to discuss if you need to continue antibiotics   Increase activity slowly   Complete by: As directed      Allergies as of 05/18/2019      Reactions   Codeine Anxiety, Other (See Comments)   Insomnia/ hyper   Omeprazole Nausea And Vomiting, Other (See Comments)   GI upset, insomnia   Warfarin Sodium Anxiety, Other (See Comments)   Hx GI bleed      Medication List    STOP taking these medications   doxycycline 100 MG capsule Commonly known as: VIBRAMYCIN     TAKE these medications   brimonidine 0.15 % ophthalmic solution Commonly known as: ALPHAGAN Place 1 drop into both eyes 2 (two) times daily.   clopidogrel 75 MG tablet Commonly known as: PLAVIX TAKE 1 TABLET EVERY DAY   cyclobenzaprine 5 MG tablet Commonly known as: FLEXERIL Take 2.5-5 mg by mouth as needed for muscle spasms.   diazepam 5 MG tablet Commonly known as: VALIUM Take 1 tablet (5 mg total) by mouth every 12 (twelve) hours as needed for anxiety.   diclofenac 1.3 %  Ptch Commonly known as: FLECTOR Place 1 patch onto the skin 2 (two) times daily.   dorzolamide-timolol 22.3-6.8 MG/ML ophthalmic solution Commonly known as: COSOPT Place 1 drop into both eyes 2 (two) times daily.  FISH OIL PO Take 1 capsule by mouth daily after lunch.   HYDROcodone-acetaminophen 5-325 MG tablet Commonly known as: NORCO/VICODIN Take 1 tablet by mouth every 6 (six) hours as needed for moderate pain.   nitroGLYCERIN 0.4 MG SL tablet Commonly known as: Nitrostat Place 1 tablet (0.4 mg total) under the tongue every 5 (five) minutes as needed for chest pain.   nystatin 100000 UNIT/ML suspension Commonly known as: MYCOSTATIN Take 5 mLs (500,000 Units total) by mouth 4 (four) times daily.   pantoprazole 40 MG tablet Commonly known as: PROTONIX Take 1 tablet by mouth daily.   phenazopyridine 100 MG tablet Commonly known as: PYRIDIUM Take 100 mg by mouth as needed for pain.   potassium chloride 10 MEQ tablet Commonly known as: KLOR-CON TAKE 1 TABLET EVERY DAY AS NEEDED. WHEN YOU TAKE YOUR FUROSEMIDE. What changed: See the new instructions.   PreserVision AREDS 2 Caps Take 1 capsule by mouth daily after lunch.   PROBIOTIC PO Take 1 tablet by mouth 2 (two) times daily.   psyllium 58.6 % packet Commonly known as: METAMUCIL Take 1 packet by mouth daily with supper. Mix in 8 oz liquid and drink   sulfamethoxazole-trimethoprim 800-160 MG tablet Commonly known as: BACTRIM DS Take 1 tablet by mouth every 12 (twelve) hours for 7 days.   tamsulosin 0.4 MG Caps capsule Commonly known as: FLOMAX Take 0.4 mg by mouth daily.   torsemide 20 MG tablet Commonly known as: DEMADEX Take one extra tablet of torsemide by mouth if weight gain of 3 pounds What changed:   how much to take  how to take this  when to take this   traMADol 50 MG tablet Commonly known as: ULTRAM Take 50 mg by mouth as needed for moderate pain.   traZODone 50 MG tablet Commonly known  as: DESYREL Take 50 mg by mouth as needed for sleep.       Allergies  Allergen Reactions  . Codeine Anxiety and Other (See Comments)    Insomnia/ hyper  . Omeprazole Nausea And Vomiting and Other (See Comments)    GI upset, insomnia  . Warfarin Sodium Anxiety and Other (See Comments)    Hx GI bleed    Time coordinating discharge: Over 30 minutes   SIGNED:   Harold Hedge, D.O. [facility] Pager: [phone]  05/18/2019, 12:45 PM

## 2019-05-19 ENCOUNTER — Telehealth: Payer: Self-pay

## 2019-05-19 LAB — URINE CULTURE: Culture: >=100000 — AB

## 2019-05-19 NOTE — Telephone Encounter (Signed)
Left message for patient to remind of missed remote transmission.  

## 2019-05-21 ENCOUNTER — Ambulatory Visit (INDEPENDENT_AMBULATORY_CARE_PROVIDER_SITE_OTHER): Payer: Medicare HMO | Admitting: *Deleted

## 2019-05-21 DIAGNOSIS — H4312 Vitreous hemorrhage, left eye: Secondary | ICD-10-CM | POA: Diagnosis not present

## 2019-05-21 DIAGNOSIS — H02834 Dermatochalasis of left upper eyelid: Secondary | ICD-10-CM | POA: Diagnosis not present

## 2019-05-21 DIAGNOSIS — H0102B Squamous blepharitis left eye, upper and lower eyelids: Secondary | ICD-10-CM | POA: Diagnosis not present

## 2019-05-21 DIAGNOSIS — H353131 Nonexudative age-related macular degeneration, bilateral, early dry stage: Secondary | ICD-10-CM | POA: Diagnosis not present

## 2019-05-21 DIAGNOSIS — H02831 Dermatochalasis of right upper eyelid: Secondary | ICD-10-CM | POA: Diagnosis not present

## 2019-05-21 DIAGNOSIS — I4821 Permanent atrial fibrillation: Secondary | ICD-10-CM

## 2019-05-21 DIAGNOSIS — H401112 Primary open-angle glaucoma, right eye, moderate stage: Secondary | ICD-10-CM | POA: Diagnosis not present

## 2019-05-21 DIAGNOSIS — H0102A Squamous blepharitis right eye, upper and lower eyelids: Secondary | ICD-10-CM | POA: Diagnosis not present

## 2019-05-21 DIAGNOSIS — H43813 Vitreous degeneration, bilateral: Secondary | ICD-10-CM | POA: Diagnosis not present

## 2019-05-21 DIAGNOSIS — Z961 Presence of intraocular lens: Secondary | ICD-10-CM | POA: Diagnosis not present

## 2019-05-21 LAB — CUP PACEART REMOTE DEVICE CHECK
Battery Remaining Longevity: 89 mo
Battery Remaining Percentage: 95.5 %
Battery Voltage: 2.96 V
Date Time Interrogation Session: 20210325112256
Implantable Lead Implant Date: 20161205
Implantable Lead Implant Date: 20161205
Implantable Lead Implant Date: 20161205
Implantable Lead Location: 753858
Implantable Lead Location: 753859
Implantable Lead Location: 753860
Implantable Pulse Generator Implant Date: 20161205
Lead Channel Impedance Value: 410 Ohm
Lead Channel Impedance Value: 960 Ohm
Lead Channel Pacing Threshold Amplitude: 0.75 V
Lead Channel Pacing Threshold Amplitude: 1 V
Lead Channel Pacing Threshold Pulse Width: 0.5 ms
Lead Channel Pacing Threshold Pulse Width: 0.8 ms
Lead Channel Sensing Intrinsic Amplitude: 12 mV
Lead Channel Setting Pacing Amplitude: 1.75 V
Lead Channel Setting Pacing Amplitude: 2.5 V
Lead Channel Setting Pacing Pulse Width: 0.5 ms
Lead Channel Setting Pacing Pulse Width: 0.8 ms
Lead Channel Setting Sensing Sensitivity: 2 mV
Pulse Gen Model: 3262
Pulse Gen Serial Number: 7802901

## 2019-05-21 LAB — CULTURE, BLOOD (ROUTINE X 2)
Culture: NO GROWTH
Culture: NO GROWTH
Special Requests: ADEQUATE

## 2019-05-22 ENCOUNTER — Encounter (INDEPENDENT_AMBULATORY_CARE_PROVIDER_SITE_OTHER): Payer: Medicare HMO | Admitting: Ophthalmology

## 2019-05-22 DIAGNOSIS — H35033 Hypertensive retinopathy, bilateral: Secondary | ICD-10-CM | POA: Diagnosis not present

## 2019-05-22 DIAGNOSIS — H353112 Nonexudative age-related macular degeneration, right eye, intermediate dry stage: Secondary | ICD-10-CM

## 2019-05-22 DIAGNOSIS — I10 Essential (primary) hypertension: Secondary | ICD-10-CM

## 2019-05-22 DIAGNOSIS — H43813 Vitreous degeneration, bilateral: Secondary | ICD-10-CM

## 2019-05-22 DIAGNOSIS — H4312 Vitreous hemorrhage, left eye: Secondary | ICD-10-CM | POA: Diagnosis not present

## 2019-05-22 DIAGNOSIS — H348322 Tributary (branch) retinal vein occlusion, left eye, stable: Secondary | ICD-10-CM | POA: Diagnosis not present

## 2019-05-22 NOTE — Progress Notes (Signed)
PPM Remote  

## 2019-05-22 NOTE — Progress Notes (Signed)
EPIC Encounter for ICM Monitoring  Patient Name: [name] is a 86 y.o. male Date: 05/22/2019 Primary Care Physican: Lawerance Cruel, MD Primary Cardiologist:Varanasi  Electrophysiologist:Taylor 05/22/2019 Weight:180 lbs   Spoke with patient.  He denies any fluid symptoms and biggest concern is the blood in his eye.  He is working with eye doctor and will know in a couple of week if he needs eye surgery.  CorVuethoracic impedancenormal.  Prescribed: Torsemide 20 mg Take 20-40 mg by mouth daily. Take one extra tablet of torsemide by mouth if weight gain of 3 pounds Potassium 10 mEq Take 10 mEq by mouth daily.   Labs: 05/18/2019 Creatinine 1.32, BUN 19, Potassium 3.8, Sodium 136, GFR 47-55 05/17/2019 Creatinine 1.07, BUN 16, Potassium 3.7, Sodium 136, GFR >60 05/16/2019 Creatinine 1.23, BUN 16, Potassium 3.9, Sodium 138, GFR 51-60 02/06/2019 Creatinine1.57, BUN22, Potassium3.5, [id number], [id number] 09/09/2018 Creatinine1.52, BUN18, Potassium4.2, Sodium130, [id number] 08/19/2018 Creatinine1.32, BUN20, Potassium3.8, Sodium136, [id number] A complete set of results can be found in Results Review.  Recommendations: No changes and encouraged to call if experiencing any fluid symptoms.    Follow-up plan: ICM clinic phone appointment on4/27/2021. 91 day device clinic remote transmission5/17/2021.    Copy of ICM check sent to Dr.Taylor  3 month ICM trend: 05/21/2019    1 Year ICM trend:       Rosalene Billings, RN 05/22/2019 11:09 AM

## 2019-05-25 DIAGNOSIS — Z7952 Long term (current) use of systemic steroids: Secondary | ICD-10-CM | POA: Diagnosis not present

## 2019-05-25 DIAGNOSIS — Z9181 History of falling: Secondary | ICD-10-CM | POA: Diagnosis not present

## 2019-05-25 DIAGNOSIS — Z7902 Long term (current) use of antithrombotics/antiplatelets: Secondary | ICD-10-CM | POA: Diagnosis not present

## 2019-05-25 DIAGNOSIS — M5416 Radiculopathy, lumbar region: Secondary | ICD-10-CM | POA: Diagnosis not present

## 2019-06-09 ENCOUNTER — Encounter (INDEPENDENT_AMBULATORY_CARE_PROVIDER_SITE_OTHER): Payer: Medicare HMO | Admitting: Ophthalmology

## 2019-06-09 ENCOUNTER — Telehealth: Payer: Self-pay | Admitting: Internal Medicine

## 2019-06-09 DIAGNOSIS — H43813 Vitreous degeneration, bilateral: Secondary | ICD-10-CM | POA: Diagnosis not present

## 2019-06-09 DIAGNOSIS — H353112 Nonexudative age-related macular degeneration, right eye, intermediate dry stage: Secondary | ICD-10-CM

## 2019-06-09 DIAGNOSIS — I10 Essential (primary) hypertension: Secondary | ICD-10-CM

## 2019-06-09 DIAGNOSIS — H4312 Vitreous hemorrhage, left eye: Secondary | ICD-10-CM

## 2019-06-09 DIAGNOSIS — H35033 Hypertensive retinopathy, bilateral: Secondary | ICD-10-CM

## 2019-06-09 NOTE — Telephone Encounter (Signed)
   [facility] Pre-operative Risk Assessment    Request for surgical clearance:  1. What type of surgery is being performed? Eye Surgery  2. When is this surgery scheduled? TBD  3. What type of clearance is required (medical clearance vs. Pharmacy clearance to hold med vs. Both)? Medical clearance  4. Are there any medications that need to be held prior to surgery and how long? clopidogrel (PLAVIX) 75 MG tablet, 5 days prior to procedure  5. Practice name and name of physician performing surgery? Dr. Zigmund Daniel Office  Dr. Zigmund Daniel   6. What is your office phone number? Call dropped prior to inquiring, however the number that contacted the office was [phone]   7.   What is your office fax number? Unable to obtain / Call dropped  8.   Anesthesia type (None, local, MAC, general) ? Unable to obtain / Call dropped   Zara Council 06/09/2019, 3:45 PM  _________________________________________________________________   (provider comments below)

## 2019-06-09 NOTE — Telephone Encounter (Signed)
I reached out to the pt to confirm the surgeon is Dr. Rodman Key. Pt answered yes. He states Dr. Zigmund Daniel is on [address] I then googled Dr. Zigmund Daniel, Ophthalmologist on [facility]. I assured the pt that I will reach out to the surgeon's office for complete information. Pt thanked me for the call. I s/w Dr. Tempie Hoist office [phone] and stated that the pt had called our office stating he was having surgery. I was advised the pt was just seen today and the surgery scheduler has already left for the day and the surgery clearance form has not even been filled out yet. I assured Dr. Zigmund Daniel office no problem and I will keep an eye out for the clearance form. Once clearance has obtained we will fax clearance notes.

## 2019-06-10 NOTE — Telephone Encounter (Signed)
   Primary Cardiologist: Larae Grooms, MD  Chart reviewed as part of pre-operative protocol coverage. Patient was contacted 06/10/2019 in reference to pre-operative risk assessment for pending surgery as outlined below.  [name] was last seen on 04/30/19 by Dr. Lovena Le (EP).  Since that day, [name] has done fine from a cardiac standpoint. He can complete 4 METs without anginal complaints.  Therefore, based on ACC/AHA guidelines, the patient would be at acceptable risk for the planned procedure without further cardiovascular testing.   Per previous recommendations and given lack of interval change in cardiac history since that time, patient can hold plavix 5 days prior to his upcoming procedure with plans to restart as soon as he is cleared to do so by Dr. Zigmund Daniel.  I will route this recommendation to the requesting party via Epic fax function and remove from pre-op pool.  Please call with questions.  Abigail Butts, PA-C 06/10/2019, 3:33 PM

## 2019-06-10 NOTE — Telephone Encounter (Signed)
   [facility] Pre-operative Risk Assessment    Request for surgical clearance:  1. What type of surgery is being performed? PARS PLANA VITRECTOMY    2. When is this surgery scheduled? TBD   3. What type of clearance is required (medical clearance vs. Pharmacy clearance to hold med vs. Both)? MEDICAL  4. Are there any medications that need to be held prior to surgery and how long? PLAVIX  X 5 DAYS PRIOR  5. Practice name and name of physician performing surgery? [facility]; DR. [name] MATTHEWS   6. What is your office phone number [phone]    7.   What is your office fax number [phone]  8.   Anesthesia type (None, local, MAC, general) ? GENERAL   [name] 06/10/2019, 3:00 PM  _________________________________________________________________   (provider comments below)

## 2019-06-15 DIAGNOSIS — F322 Major depressive disorder, single episode, severe without psychotic features: Secondary | ICD-10-CM | POA: Diagnosis not present

## 2019-06-15 DIAGNOSIS — N400 Enlarged prostate without lower urinary tract symptoms: Secondary | ICD-10-CM | POA: Diagnosis not present

## 2019-06-15 DIAGNOSIS — Z8546 Personal history of malignant neoplasm of prostate: Secondary | ICD-10-CM | POA: Diagnosis not present

## 2019-06-15 DIAGNOSIS — N183 Chronic kidney disease, stage 3 unspecified: Secondary | ICD-10-CM | POA: Diagnosis not present

## 2019-06-15 DIAGNOSIS — I4891 Unspecified atrial fibrillation: Secondary | ICD-10-CM | POA: Diagnosis not present

## 2019-06-15 DIAGNOSIS — E7800 Pure hypercholesterolemia, unspecified: Secondary | ICD-10-CM | POA: Diagnosis not present

## 2019-06-15 DIAGNOSIS — C61 Malignant neoplasm of prostate: Secondary | ICD-10-CM | POA: Diagnosis not present

## 2019-06-15 DIAGNOSIS — I2510 Atherosclerotic heart disease of native coronary artery without angina pectoris: Secondary | ICD-10-CM | POA: Diagnosis not present

## 2019-06-15 DIAGNOSIS — I10 Essential (primary) hypertension: Secondary | ICD-10-CM | POA: Diagnosis not present

## 2019-06-23 ENCOUNTER — Ambulatory Visit (INDEPENDENT_AMBULATORY_CARE_PROVIDER_SITE_OTHER): Payer: Medicare HMO

## 2019-06-23 DIAGNOSIS — I5022 Chronic systolic (congestive) heart failure: Secondary | ICD-10-CM

## 2019-06-23 DIAGNOSIS — Z950 Presence of cardiac pacemaker: Secondary | ICD-10-CM

## 2019-06-24 ENCOUNTER — Telehealth: Payer: Self-pay

## 2019-06-24 NOTE — Telephone Encounter (Signed)
Spoke with patient to remind of missed remote transmission 

## 2019-06-26 ENCOUNTER — Telehealth: Payer: Self-pay

## 2019-06-26 NOTE — Progress Notes (Signed)
EPIC Encounter for ICM Monitoring  Patient Name: [name] is a 86 y.o. male Date: 06/26/2019 Primary Care Physican: Lawerance Cruel, MD Primary Cardiologist:Varanasi  Electrophysiologist:Taylor 05/22/2019 Weight:180 lbs   Attempted call to patient and unable to reach.  Transmission reviewed.   CorVuethoracic impedancenormal.  Prescribed: Torsemide 20 mg Take 20-40 mg by mouth daily. Take one extra tablet of torsemide by mouth if weight gain of 3 pounds Potassium 10 mEq Take 10 mEq by mouth daily.   Labs: 05/18/2019 Creatinine 1.32, BUN 19, Potassium 3.8, Sodium 136, GFR 47-55 05/17/2019 Creatinine 1.07, BUN 16, Potassium 3.7, Sodium 136, GFR >60 05/16/2019 Creatinine 1.23, BUN 16, Potassium 3.9, Sodium 138, GFR 51-60 02/06/2019 Creatinine1.57, BUN22, Potassium3.5, Sodium137,GFR38-44 09/09/2018 Creatinine1.52, BUN18, Potassium4.2, Sodium130, [id number] 08/19/2018 Creatinine1.32, BUN20, Potassium3.8, Sodium136, [id number] A complete set of results can be found in Results Review.  Recommendations: Unable to reach.    Follow-up plan: ICM clinic phone appointment on6/02/2019. 91 day device clinic remote transmission5/17/2021.    Copy of ICM check sent to Dr.Taylor  3 month ICM trend: 06/25/2019    1 Year ICM trend:       Rosalene Billings, RN 06/26/2019 2:48 PM

## 2019-06-26 NOTE — Telephone Encounter (Signed)
Remote ICM transmission received.  Attempted call to patient regarding ICM remote transmission and no message.  °

## 2019-06-26 NOTE — Progress Notes (Signed)
Patient returned call.  He is denies fluid symptoms.  He is having eye surgery in May which should improve the vision in his eye.  Transmission reviewed. No changes and encouraged to call if experiencing any fluid symptoms.

## 2019-07-01 DIAGNOSIS — R030 Elevated blood-pressure reading, without diagnosis of hypertension: Secondary | ICD-10-CM | POA: Diagnosis not present

## 2019-07-01 DIAGNOSIS — M5136 Other intervertebral disc degeneration, lumbar region: Secondary | ICD-10-CM | POA: Diagnosis not present

## 2019-07-01 DIAGNOSIS — Z6828 Body mass index (BMI) 28.0-28.9, adult: Secondary | ICD-10-CM | POA: Diagnosis not present

## 2019-07-07 ENCOUNTER — Other Ambulatory Visit: Payer: Self-pay

## 2019-07-07 ENCOUNTER — Encounter (INDEPENDENT_AMBULATORY_CARE_PROVIDER_SITE_OTHER): Payer: Medicare HMO | Admitting: Ophthalmology

## 2019-07-07 DIAGNOSIS — H43813 Vitreous degeneration, bilateral: Secondary | ICD-10-CM | POA: Diagnosis not present

## 2019-07-07 DIAGNOSIS — H4312 Vitreous hemorrhage, left eye: Secondary | ICD-10-CM | POA: Diagnosis not present

## 2019-07-07 DIAGNOSIS — H353112 Nonexudative age-related macular degeneration, right eye, intermediate dry stage: Secondary | ICD-10-CM

## 2019-07-07 DIAGNOSIS — I10 Essential (primary) hypertension: Secondary | ICD-10-CM

## 2019-07-07 DIAGNOSIS — H35033 Hypertensive retinopathy, bilateral: Secondary | ICD-10-CM

## 2019-07-08 DIAGNOSIS — R05 Cough: Secondary | ICD-10-CM | POA: Diagnosis not present

## 2019-07-08 DIAGNOSIS — G479 Sleep disorder, unspecified: Secondary | ICD-10-CM | POA: Diagnosis not present

## 2019-07-08 DIAGNOSIS — F419 Anxiety disorder, unspecified: Secondary | ICD-10-CM | POA: Diagnosis not present

## 2019-07-08 DIAGNOSIS — M1990 Unspecified osteoarthritis, unspecified site: Secondary | ICD-10-CM | POA: Diagnosis not present

## 2019-07-08 DIAGNOSIS — R69 Illness, unspecified: Secondary | ICD-10-CM | POA: Diagnosis not present

## 2019-07-08 DIAGNOSIS — F332 Major depressive disorder, recurrent severe without psychotic features: Secondary | ICD-10-CM | POA: Diagnosis not present

## 2019-07-10 ENCOUNTER — Other Ambulatory Visit (HOSPITAL_COMMUNITY)
Admission: RE | Admit: 2019-07-10 | Discharge: 2019-07-10 | Disposition: A | Discharge status: Home / Self Care | Payer: Medicare HMO | Source: Ambulatory Visit | Attending: Ophthalmology | Admitting: Ophthalmology

## 2019-07-10 DIAGNOSIS — Z01812 Encounter for preprocedural laboratory examination: Secondary | ICD-10-CM | POA: Insufficient documentation

## 2019-07-10 DIAGNOSIS — Z20822 Contact with and (suspected) exposure to covid-19: Secondary | ICD-10-CM | POA: Insufficient documentation

## 2019-07-10 LAB — SARS CORONAVIRUS 2 (TAT 6-24 HRS): SARS Coronavirus 2: NEGATIVE

## 2019-07-13 ENCOUNTER — Encounter (HOSPITAL_COMMUNITY): Payer: Self-pay | Admitting: Ophthalmology

## 2019-07-13 ENCOUNTER — Other Ambulatory Visit: Payer: Self-pay

## 2019-07-13 ENCOUNTER — Encounter: Payer: Self-pay | Admitting: Internal Medicine

## 2019-07-13 DIAGNOSIS — H4312 Vitreous hemorrhage, left eye: Secondary | ICD-10-CM | POA: Diagnosis present

## 2019-07-13 NOTE — Progress Notes (Signed)
PERIOPERATIVE PRESCRIPTION FOR IMPLANTED CARDIAC DEVICE PROGRAMMING  Patient Information: Name:  [name]  DOB:  1926/09/03  MRN:  [id number]    Planned Procedure: Pars plana vitrectomy, membrane peel, laser, gas injection left eye  Date of Procedure: 07/14/19  Cautery will be used.  Position during surgery:   Please send documentation back to:  [facility] (Fax # [phone])   Catalina Pizza, RN  07/13/2019 2:58 PM   Device Information:  Clinic EP Physician:  Cristopher Peru, MD   Device Type:  Pacemaker Manufacturer and Phone #:  [employer]: [phone] Pacemaker Dependent?:  No. Date of Last Device Check:  06/25/2019 Normal Device Function?:  Yes.    Electrophysiologist's Recommendations:   Have magnet available.  Provide continuous ECG monitoring when magnet is used or reprogramming is to be performed.   Procedure may interfere with device function.  Magnet should be placed over device during procedure.  Per Device Clinic Standing Orders, York Ram, RN  3:16 PM 07/13/2019

## 2019-07-13 NOTE — H&P (Signed)
[name] is an 86 y.o. male.   Chief Complaint:sudden loss of vision left eye HPI: sudden loss of vision left eye, two months ago  Past Medical History:  Diagnosis Date  . Anxiety   . Arthritis   . Atrial fibrillation, permanent ([place]) 01/07/2016  . Blood transfusion    "related to a surgery"  . BPH (benign prostatic hypertrophy) with urinary obstruction    s/p turp yrs ago  . Bradycardia    a. Amio d/c'd 08/2013; brady arrest 08/2013 after PCI >>> recurrent AF >>> Amiodarone restarted. b. Pacemaker being considered in 11/2014.  [name] CAD (coronary artery disease)    a. s/p MI and prior PCI of LAD;  b. LHC (08/2013):  prox LAD 60-70%, mid LAD stents ok, ostial lesion at Dx jailed by stent, mild CFX and RCA disesase >>>  PCI (09/08/13):  rotational atherectomy + Promus DES to prox LAD  . Cardiomyopathy ([place])    a. Echo (08/2013):  EF 30-35%, AS hypokinesis, Gr 1 diast dysfn, mild MR, mild LAE >>> b. improved EF 50-55% by echo 8/15. c. EF down again by echo 12/2014 to 30-35% but 51% by nuc.  [name] Chronic lower back pain   . Chronic systolic CHF (congestive heart failure) ([place])   . CKD (chronic kidney disease), stage III    a. Per review of labs baseline Cr 1.1-1.3.  [name] DDD (degenerative disc disease)    chronic back pain  . Depression   . Diverticulitis of colon with bleeding    s/p sigmoid resection '88  . DJD (degenerative joint disease) hips and knees   s/p bilateral total replacements  . GERD (gastroesophageal reflux disease)    occ. take prevacid  . History of GI diverticular bleed april 2012   transfused blood and resolved without surgical intervention  . Hypertension   . Impaired hearing bilateral    only left hearing aid  . Impotence, organic    s/p penile prosthesis 1990's  . Incomplete bladder emptying   . LBBB (left bundle branch block)   . Meningioma ([place]) right -sided w/ right VI palsy   followed by dr Gaynell Face  . Mitral regurgitation    a. Mild-mod by echo 12/2014.   [name] Myocardial infarction [facility]) 1980's- medical intervention   "so mild I didn't know I'd had it"  . PAF (paroxysmal atrial fibrillation) ([facility])    not on coumadin due to hx of GI bleed.  . Prostate cancer ([place]) 11/30/13   Gleason 8, volume 22.14 cc  . Sleep apnea    non-compliant cpap    Past Surgical History:  Procedure Laterality Date  . APPENDECTOMY    . CARDIAC CATHETERIZATION  2007   noncritical cad (results w/ chart)  . CARDIOVERSION N/A 10/13/2015   Procedure: CARDIOVERSION;  Surgeon: Josue Hector, MD;  Location: [place];  Service: Cardiovascular;  Laterality: N/A;  . CATARACT EXTRACTION W/ INTRAOCULAR LENS  IMPLANT, BILATERAL Bilateral ~ 2000  . CHOLECYSTECTOMY    . CLOSED REDUCTION HIP DISLOCATION Right "several"  . CORONARY ANGIOPLASTY WITH STENT PLACEMENT  08-03-08   drug-eluting stent x2 distal and mid lad  . EP IMPLANTABLE DEVICE N/A 01/31/2015   Procedure: BiV Pacemaker Insertion CRT-P;  Surgeon: Evans Lance, MD;  Location: [place] CV LAB;  Service: Cardiovascular;  Laterality: N/A;  . EP IMPLANTABLE DEVICE N/A 02/07/2015   Procedure: Lead Revision/Repair;  Surgeon: Deboraha Sprang, MD;  Location: [place] CV LAB;  Service: Cardiovascular;  Laterality: N/A;  .  ESOPHAGOGASTRODUODENOSCOPY N/A 02/11/2014   Procedure: ESOPHAGOGASTRODUODENOSCOPY (EGD);  Surgeon: Cleotis Nipper, MD;  Location: [facility] ENDOSCOPY;  Service: Endoscopy;  Laterality: N/A;  . gamma knife radiation  2000   [facility] for meningioma, last eval 2013- no change  . INGUINAL HERNIA REPAIR Bilateral   . INNER EAR SURGERY Right yrs ago   "trying to get my hearing back  . LEFT HEART CATHETERIZATION WITH CORONARY ANGIOGRAM N/A 09/04/2013   Procedure: LEFT HEART CATHETERIZATION WITH CORONARY ANGIOGRAM;  Surgeon: Jettie Booze, MD;  Location: [facility] CATH LAB;  Service: Cardiovascular;  Laterality: N/A;  . PENILE PROSTHESIS IMPLANT  1990's  . PERCUTANEOUS CORONARY ROTOBLATOR INTERVENTION (PCI-R) N/A  09/08/2013   Procedure: PERCUTANEOUS CORONARY ROTOBLATOR INTERVENTION (PCI-R);  Surgeon: Jettie Booze, MD;  Location: [facility] CATH LAB;  Service: Cardiovascular;  Laterality: N/A;  . PROSTATE BIOPSY  11/30/13   Gleason 8, vol 22.14 cc  . REVISION TOTAL KNEE ARTHROPLASTY Right   . SHOULDER OPEN ROTATOR CUFF REPAIR Left   . TOTAL HIP ARTHROPLASTY Right 03-25-08--  . TOTAL HIP ARTHROPLASTY Left 2005  . TOTAL HIP REVISION Right 3-4 times  . TOTAL KNEE ARTHROPLASTY Right 2004  . TOTAL KNEE ARTHROPLASTY Left 1997  . TRANSURETHRAL RESECTION OF PROSTATE  "years ago"  . TRANSURETHRAL RESECTION OF PROSTATE  01/08/2011   Procedure: TRANSURETHRAL RESECTION OF THE PROSTATE (TURP);  Surgeon: Franchot Gallo;  Location: [place];  Service: Urology;  Laterality: N/A;  GYRUS     Family History  Problem Relation Age of Onset  . Heart disease Mother   . Heart attack Mother   . Emphysema Father   . Cancer Brother        liver cancer  . Cancer Other    Social History:  reports that he quit smoking about 60 years ago. His smoking use included cigarettes. He has a 14.00 pack-year smoking history. He has never used smokeless tobacco. He reports current alcohol use. He reports that he does not use drugs.  Allergies:  Allergies  Allergen Reactions  . Codeine Anxiety and Other (See Comments)    Insomnia/ hyper  . Omeprazole Nausea And Vomiting and Other (See Comments)    GI upset, insomnia  . Warfarin Sodium Anxiety and Other (See Comments)    Hx GI bleed    No medications prior to admission.    Review of systems otherwise negative  There were no vitals taken for this visit.  Physical exam: Mental status: oriented x3. Eyes: See eye exam associated with this date of surgery in media tab.  Scanned in by scanning center Ears, Nose, Throat: within normal limits Neck: Within Normal limits General: within normal limits Chest: Within normal limits Breast: deferred Heart:  Within normal limits Abdomen: Within normal limits GU: deferred Extremities: within normal limits Skin: within normal limits  Assessment/Plan Vitreous hemorrhage related to branch retinal vein occlusion left eye Plan: To [facility] for Pars plana vitrectomy, membrane peel, laser, gas injection left eye  Hayden Pedro 07/13/2019, 9:11 AM

## 2019-07-13 NOTE — Anesthesia Preprocedure Evaluation (Addendum)
Anesthesia Evaluation  Patient identified by MRN, date of birth, ID band Patient awake    Reviewed: Allergy & Precautions, H&P , NPO status , Patient's Chart, lab work & pertinent test results  Airway Mallampati: II  TM Distance: >3 FB Neck ROM: Full    Dental no notable dental hx. (+) Teeth Intact, Dental Advisory Given   Pulmonary sleep apnea , former smoker,    Pulmonary exam normal breath sounds clear to auscultation       Cardiovascular hypertension, Pt. on medications + CAD, + Past MI, + Cardiac Stents and +CHF  + dysrhythmias Atrial Fibrillation + Valvular Problems/Murmurs MR  Rhythm:Regular Rate:Normal     Neuro/Psych Anxiety Depression negative neurological ROS     GI/Hepatic Neg liver ROS, GERD  Medicated,  Endo/Other  negative endocrine ROS  Renal/GU Renal disease  negative genitourinary   Musculoskeletal  (+) Arthritis , Osteoarthritis,    Abdominal   Peds  Hematology negative hematology ROS (+)   Anesthesia Other Findings   Reproductive/Obstetrics negative OB ROS                           Anesthesia Physical Anesthesia Plan  ASA: III  Anesthesia Plan: General   Post-op Pain Management:    Induction: Intravenous  PONV Risk Score and Plan: 3 and Ondansetron, Dexamethasone and Treatment may vary due to age or medical condition  Airway Management Planned: Oral ETT  Additional Equipment:   Intra-op Plan:   Post-operative Plan: Extubation in OR  Informed Consent: I have reviewed the patients History and Physical, chart, labs and discussed the procedure including the risks, benefits and alternatives for the proposed anesthesia with the patient or authorized representative who has indicated his/her understanding and acceptance.     Dental advisory given  Plan Discussed with: CRNA  Anesthesia Plan Comments: (PAT note written 07/13/2019 by Myra Gianotti, PA-C. )        Anesthesia Quick Evaluation

## 2019-07-13 NOTE — Progress Notes (Addendum)
Anesthesia Chart Review: SAME DAY WORK-UP   Case: [id number] Date/Time: 07/14/19 1115   Procedures:      PARS PLANA VITRECTOMY 27 GAUGE  and laser (Left )     INSERTION OF GAS (Left )   Anesthesia type: General   Pre-op diagnosis: vitreous hemorrhage left eye   Location: [facility] OR ROOM 08 / [place] [facility]   Surgeons: Hayden Pedro, MD      DISCUSSION: Patient is a 86 year old male scheduled for the above procedure.  Per Dr. Zigmund Daniel notes, patient had some loss of vision in his left eye 2 months ago and diagnosed with vitreous hemorrhage related to branch retinal vein occlusion.  The above procedure recommended.[name]  History includes former smoker (quit 01/05/59), HTN, afib (s/p unsuccessful DCCV 10/13/15), mitral regurgitation (mild-moderate 07/2018 echo), bradycardia (with post-PCI severe bradycardia/asytole 09/08/13, s/p [employer] Jude PPM 01/31/15, atrial lead revision 02/07/15), LBBB, CAD (MI 1980's; s/p DES distal and mid LAD 08/03/08; s/p atherectomy and DES proximal LAD 09/08/13), cardiomyopathy, chronic systolic CHF, BPH (s/p TURP 01/08/11), prostate cancer (11/30/13), meningioma (along the right side of the clivus with right cavernous sinus involvement 02/15/04 MRI with right 6th nerve palsy, s/p marginal myotomy, recession of medial rectus muscle and resection right lateral rectus muscle 2006), GERD, diverticulitis (s/p sigmoid resection 1988), CKD, OSA (non-compliant CPAP).  Admission 05/16/2019 for severe sepsis due to Serratia marcescens UTI, s/p improvement with treatment.  Dr. Zigmund Daniel requested preoperative cardiology input.  On 06/10/2019 as outlined by Roby Lofts, PA-C, "...[name] was last seen on 04/30/19 by Dr. Lovena Le (EP).  Since that day, [name] has done fine from a cardiac standpoint. He can complete 4 METs without anginal complaints.  Therefore, based on ACC/AHA guidelines, the patient would be at acceptable risk for the planned procedure without further cardiovascular testing.    Per previous recommendations and given lack of interval change in cardiac history since that time, patient can hold plavix 5 days prior to his upcoming procedure with plans to restart as soon as he is cleared to do so by Dr. Zigmund Daniel."  Hhe has a [place]. CorVue thoracic impedance normal on 06/23/2019 remote ICU monitoring. Perioperative device instructions still pending from [facility].  07/10/2019 presurgical COVID-19 test is negative.  He is a same-day work-up, so labs and anesthesia team evaluation on the day of surgery.   VS:  BP Readings from Last 3 Encounters:  05/18/19 107/66  04/30/19 114/68  02/06/19 118/64   Pulse Readings from Last 3 Encounters:  05/18/19 79  04/30/19 81  02/06/19 (!) 55     PROVIDERS: Lawerance Cruel, MD his PCP Larae Grooms, MD is primary cardiologist.  Last evaluation 02/06/2019.  He had GI bleed on warfarin in the past and did not tolerate Eliquis. Cristopher Peru, MD is EP cardiologist last evaluation 04/30/2019.  [place] PPM working normally.  BiV pacing 70% of the time.    LABS: He is for updated labs on the day of surgery. Currently, last lab results include: Lab Results  Component Value Date   WBC 8.6 05/18/2019   HGB 12.4 (L) 05/18/2019   HCT 37.3 (L) 05/18/2019   PLT 141 (L) 05/18/2019   GLUCOSE 109 (H) 05/18/2019   ALT 15 05/18/2019   AST 16 05/18/2019   NA 136 05/18/2019   K 3.8 05/18/2019   CL 103 05/18/2019   CREATININE 1.32 (H) 05/18/2019   BUN 19 05/18/2019   CO2 25 05/18/2019  INR 1.1 05/16/2019     IMAGES: 1V CXR 05/16/19: FINDINGS: Redemonstrated left chest AICD. Unchanged cardiomegaly. Aortic atherosclerosis. Subtle asymmetric ill-defined opacity within the right mid to lower lung field. The left lung is clear. No evidence of pleural effusion or pneumothorax. No acute bony abnormality. IMPRESSION: Subtle asymmetric ill-defined opacity within the right mid to  lower lung field which may reflect asymmetric edema. Early pneumonia cannot be excluded. Unchanged cardiomegaly with aortic atherosclerosis.   EKG: 05/16/19: Afib at 104 bpm. Ventricular premature complex. Left bundle branch block.   CV: Echo 08/06/18: IMPRESSIONS  1. The left ventricle has normal systolic function, with an ejection  fraction of 55-60%. The cavity size was normal. Left ventricular diastolic  function could not be evaluated secondary to atrial fibrillation.  2. The right ventricle has normal systolic function. The cavity was  moderately enlarged. There is no increase in right ventricular wall  thickness.  3. Left atrial size was severely dilated.  4. Right atrial size was moderately dilated.  5. Mitral valve regurgitation is mild to moderate by color flow Doppler.  The MR jet is eccentric anteriorly directed.  6. The aortic valve is tricuspid. Mild sclerosis of the aortic valve.  7. The underlying heart rhythm was very irregular and could be atrial  fibrillation.  ([place]: 08/24/18: LVEF 50-55%, moderate MR, mild AR; 01/06/15: LVEF 30-35%)   Nuclear stress test 08/23/17:  Defect 1: There is a medium defect of moderate severity present in the mid anteroseptal, apical septal and apex location. This appears to be due to previous myocardial infarction. There is no significant amount of ischemia.  This is an intermediate risk study.  Study was not able to be gated. Were not able to assess left ventricular function.   Cardiac cath 09/04/13 IMPRESSIONS: 1. Normal left main coronary artery. 2. Significant calcific disease in the proximal left anterior descending artery.  FFR of the LAD after hyperemia was 0.75. 3. Mild disease in the left circumflex artery and its branches. 4. Mild disease in the right coronary artery. 5. LVEDP 9 mmHg.   RECOMMENDATION:  Plan for PCI of the LAD. We'll have to do this from the groin. The tortuosity of the shoulder does not  allow for adequate torquing of the catheters.   PCI 09/08/13: IMPRESSIONS: 1.  Successful rotational atherectomy followed by stent placement to the proximal left anterior descending artery with a 3.0 x 16 Promus drug-eluting stent, postdilated to 3.3 mm in diameter.  Preintervention intravascular ultrasound of the LAD revealed a proximal lesion with a cross-sectional area of 3.6 mm2; an ostial lesion with a cross-sectional area of 4.7 mm2; and a distal left main cross-sectional area of 7.60mm2. 2.  Patent left circumflex artery and its branches despite LAD stent coming close to the ostium. 3.  Patent  right coronary artery. RECOMMENDATION:  Continue dual antiplatelet therapy indefinitely. He has a chronically slow heart rate. He will need a Holter monitor as an outpatient to see what if pacemaker is actually indicated. Will repeat echocardiogram in 4-6 weeks to see if his ejection fraction is improved.   Sinus bradycardia on 09/23/13 24-hour event monitor.  No bilateral ICA carotid disease on 2005 ultrasound.   Past Medical History:  Diagnosis Date  . Anxiety   . Arthritis   . Atrial fibrillation, permanent ([place]) 01/07/2016  . Blood transfusion    "related to a surgery"  . BPH (benign prostatic hypertrophy) with urinary obstruction    s/p turp yrs ago  . Bradycardia  a. Amio d/c'd 08/2013; brady arrest 08/2013 after PCI >>> recurrent AF >>> Amiodarone restarted. b. Pacemaker being considered in 11/2014.  [name] CAD (coronary artery disease)    a. s/p MI and prior PCI of LAD;  b. LHC (08/2013):  prox LAD 60-70%, mid LAD stents ok, ostial lesion at Dx jailed by stent, mild CFX and RCA disesase >>>  PCI (09/08/13):  rotational atherectomy + Promus DES to prox LAD  . Cardiomyopathy ([place])    a. Echo (08/2013):  EF 30-35%, AS hypokinesis, Gr 1 diast dysfn, mild MR, mild LAE >>> b. improved EF 50-55% by echo 8/15. c. EF down again by echo 12/2014 to 30-35% but 51% by nuc.  [name] Chronic lower back pain    . Chronic systolic CHF (congestive heart failure) ([place])   . CKD (chronic kidney disease), stage III    a. Per review of labs baseline Cr 1.1-1.3.  [name] DDD (degenerative disc disease)    chronic back pain  . Depression   . Diverticulitis of colon with bleeding    s/p sigmoid resection '88  . DJD (degenerative joint disease) hips and knees   s/p bilateral total replacements  . GERD (gastroesophageal reflux disease)    occ. take prevacid  . History of GI diverticular bleed april 2012   transfused blood and resolved without surgical intervention  . Hypertension   . Impaired hearing bilateral    only left hearing aid  . Impotence, organic    s/p penile prosthesis 1990's  . Incomplete bladder emptying   . LBBB (left bundle branch block)   . Meningioma ([place]) right -sided w/ right VI palsy   followed by dr Gaynell Face  . Mitral regurgitation    a. Mild-mod by echo 12/2014.  [name] Myocardial infarction [facility]) 1980's- medical intervention   "so mild I didn't know I'd had it"  . PAF (paroxysmal atrial fibrillation) (HCC)    not on coumadin due to hx of GI bleed.  . Prostate cancer ([place]) 11/30/13   Gleason 8, volume 22.14 cc  . Sleep apnea    non-compliant cpap    Past Surgical History:  Procedure Laterality Date  . APPENDECTOMY    . CARDIAC CATHETERIZATION  2007   noncritical cad (results w/ chart)  . CARDIOVERSION N/A 10/13/2015   Procedure: CARDIOVERSION;  Surgeon: Josue Hector, MD;  Location: [place];  Service: Cardiovascular;  Laterality: N/A;  . CATARACT EXTRACTION W/ INTRAOCULAR LENS  IMPLANT, BILATERAL Bilateral ~ 2000  . CHOLECYSTECTOMY    . CLOSED REDUCTION HIP DISLOCATION Right "several"  . CORONARY ANGIOPLASTY WITH STENT PLACEMENT  08-03-08   drug-eluting stent x2 distal and mid lad  . EP IMPLANTABLE DEVICE N/A 01/31/2015   Procedure: BiV Pacemaker Insertion CRT-P;  Surgeon: Evans Lance, MD;  Location: [place] [facility];  Service: Cardiovascular;  Laterality: N/A;  .  EP IMPLANTABLE DEVICE N/A 02/07/2015   Procedure: Lead Revision/Repair;  Surgeon: Deboraha Sprang, MD;  Location: [place] [facility];  Service: Cardiovascular;  Laterality: N/A;  . ESOPHAGOGASTRODUODENOSCOPY N/A 02/11/2014   Procedure: ESOPHAGOGASTRODUODENOSCOPY (EGD);  Surgeon: Cleotis Nipper, MD;  Location[facility] ENDOSCOPY;  Service: Endoscopy;  Laterality: N/A;  . gamma knife radiation  2000   [facility] for meningioma, last eval 2013- no change  . INGUINAL HERNIA REPAIR Bilateral   . INNER EAR SURGERY Right yrs ago   "trying to get my hearing back  . LEFT HEART CATHETERIZATION WITH CORONARY ANGIOGRAM N/A 09/04/2013   Procedure: LEFT HEART  CATHETERIZATION WITH CORONARY ANGIOGRAM;  Surgeon: Jettie Booze, MD;  Location[facility] CATH LAB;  Service: Cardiovascular;  Laterality: N/A;  . PENILE PROSTHESIS IMPLANT  1990's  . PERCUTANEOUS CORONARY ROTOBLATOR INTERVENTION (PCI-R) N/A 09/08/2013   Procedure: PERCUTANEOUS CORONARY ROTOBLATOR INTERVENTION (PCI-R);  Surgeon: Jettie Booze, MD;  Location: [facility] CATH LAB;  Service: Cardiovascular;  Laterality: N/A;  . PROSTATE BIOPSY  11/30/13   Gleason 8, vol 22.14 cc  . REVISION TOTAL KNEE ARTHROPLASTY Right   . SHOULDER OPEN ROTATOR CUFF REPAIR Left   . TOTAL HIP ARTHROPLASTY Right 03-25-08--  . TOTAL HIP ARTHROPLASTY Left 2005  . TOTAL HIP REVISION Right 3-4 times  . TOTAL KNEE ARTHROPLASTY Right 2004  . TOTAL KNEE ARTHROPLASTY Left 1997  . TRANSURETHRAL RESECTION OF PROSTATE  "years ago"  . TRANSURETHRAL RESECTION OF PROSTATE  01/08/2011   Procedure: TRANSURETHRAL RESECTION OF THE PROSTATE (TURP);  Surgeon: Franchot Gallo;  Location: [place];  Service: Urology;  Laterality: N/A;  GYRUS     MEDICATIONS: No current facility-administered medications for this encounter.   . brimonidine (ALPHAGAN P) 0.1 % SOLN  . clopidogrel (PLAVIX) 75 MG tablet  . cyclobenzaprine (FLEXERIL) 5 MG tablet  . diazepam (VALIUM) 5 MG tablet  .  dorzolamide-timolol (COSOPT) 22.3-6.8 MG/ML ophthalmic solution  . Multiple Vitamins-Minerals (MULTIVITAMIN WITH MINERALS) tablet  . nitroGLYCERIN (NITROSTAT) 0.4 MG SL tablet  . OVER THE COUNTER MEDICATION  . pantoprazole (PROTONIX) 40 MG tablet  . potassium chloride (K-DUR,KLOR-CON) 10 MEQ tablet  . Probiotic Product (PROBIOTIC PO)  . psyllium (METAMUCIL) 58.6 % packet  . tamsulosin (FLOMAX) 0.4 MG CAPS capsule  . torsemide (DEMADEX) 20 MG tablet  . traMADol (ULTRAM) 50 MG tablet  . traZODone (DESYREL) 50 MG tablet    Myra Gianotti, PA-C Surgical Short Stay/Anesthesiology [facility] Phone [phone] [employer] Phone [phone] 07/13/2019 12:24 PM

## 2019-07-13 NOTE — Progress Notes (Signed)
Pt denies any acute SOB. Pt denies chest pain. Pt stated that jhe is under the care of Dr. Irish Lack, Cardiology, Dr. Cristopher Peru, EP and Dr. Lawerance Cruel, PCP. Pt requested that his nurse Helene Kelp complete his pre-op assessment. Nurse emailed [facility] Peri-op Prescription for ICD. Helene Kelp stated that Pt last dose of Plavix was " Wednesday" as instructed. Helene Kelp stated that pt was instructed to stop takin Aspirin, vitamins, fish oil and herbal medications. Do not take any NSAIDs ie: Ibuprofen, Advil, Naproxen (Aleve), Motrin, BC and Goody Powder. Helene Kelp denies recent labs. Helene Kelp reminded to have pt quarantine. Helene Kelp verbalized understanding of all pre-op instructions. PA, Anesthesiology asked to review pt history.

## 2019-07-14 ENCOUNTER — Ambulatory Visit (HOSPITAL_COMMUNITY): Payer: Medicare HMO | Admitting: Vascular Surgery

## 2019-07-14 ENCOUNTER — Ambulatory Visit (HOSPITAL_COMMUNITY)
Admission: RE | Admit: 2019-07-14 | Discharge: 2019-07-15 | Disposition: A | Discharge status: Home / Self Care | Payer: Medicare HMO | Attending: Ophthalmology | Admitting: Ophthalmology

## 2019-07-14 ENCOUNTER — Encounter (HOSPITAL_COMMUNITY)
Admission: RE | Disposition: A | Discharge status: Home / Self Care | Payer: Self-pay | Source: Home / Self Care | Attending: Ophthalmology

## 2019-07-14 ENCOUNTER — Encounter (HOSPITAL_COMMUNITY): Payer: Self-pay | Admitting: Ophthalmology

## 2019-07-14 ENCOUNTER — Other Ambulatory Visit: Payer: Self-pay

## 2019-07-14 DIAGNOSIS — K219 Gastro-esophageal reflux disease without esophagitis: Secondary | ICD-10-CM | POA: Diagnosis not present

## 2019-07-14 DIAGNOSIS — N183 Chronic kidney disease, stage 3 unspecified: Secondary | ICD-10-CM | POA: Insufficient documentation

## 2019-07-14 DIAGNOSIS — I452 Bifascicular block: Secondary | ICD-10-CM | POA: Insufficient documentation

## 2019-07-14 DIAGNOSIS — E876 Hypokalemia: Secondary | ICD-10-CM | POA: Diagnosis not present

## 2019-07-14 DIAGNOSIS — M1990 Unspecified osteoarthritis, unspecified site: Secondary | ICD-10-CM | POA: Diagnosis not present

## 2019-07-14 DIAGNOSIS — Z9842 Cataract extraction status, left eye: Secondary | ICD-10-CM | POA: Diagnosis not present

## 2019-07-14 DIAGNOSIS — Z87891 Personal history of nicotine dependence: Secondary | ICD-10-CM | POA: Insufficient documentation

## 2019-07-14 DIAGNOSIS — Z825 Family history of asthma and other chronic lower respiratory diseases: Secondary | ICD-10-CM | POA: Insufficient documentation

## 2019-07-14 DIAGNOSIS — Z9119 Patient's noncompliance with other medical treatment and regimen: Secondary | ICD-10-CM | POA: Insufficient documentation

## 2019-07-14 DIAGNOSIS — F419 Anxiety disorder, unspecified: Secondary | ICD-10-CM | POA: Diagnosis not present

## 2019-07-14 DIAGNOSIS — I340 Nonrheumatic mitral (valve) insufficiency: Secondary | ICD-10-CM | POA: Diagnosis not present

## 2019-07-14 DIAGNOSIS — I252 Old myocardial infarction: Secondary | ICD-10-CM | POA: Insufficient documentation

## 2019-07-14 DIAGNOSIS — Z809 Family history of malignant neoplasm, unspecified: Secondary | ICD-10-CM | POA: Insufficient documentation

## 2019-07-14 DIAGNOSIS — Z96643 Presence of artificial hip joint, bilateral: Secondary | ICD-10-CM | POA: Insufficient documentation

## 2019-07-14 DIAGNOSIS — F329 Major depressive disorder, single episode, unspecified: Secondary | ICD-10-CM | POA: Diagnosis not present

## 2019-07-14 DIAGNOSIS — G4730 Sleep apnea, unspecified: Secondary | ICD-10-CM | POA: Insufficient documentation

## 2019-07-14 DIAGNOSIS — Z8249 Family history of ischemic heart disease and other diseases of the circulatory system: Secondary | ICD-10-CM | POA: Insufficient documentation

## 2019-07-14 DIAGNOSIS — Z950 Presence of cardiac pacemaker: Secondary | ICD-10-CM | POA: Insufficient documentation

## 2019-07-14 DIAGNOSIS — I2510 Atherosclerotic heart disease of native coronary artery without angina pectoris: Secondary | ICD-10-CM | POA: Diagnosis not present

## 2019-07-14 DIAGNOSIS — Z955 Presence of coronary angioplasty implant and graft: Secondary | ICD-10-CM | POA: Insufficient documentation

## 2019-07-14 DIAGNOSIS — Z888 Allergy status to other drugs, medicaments and biological substances status: Secondary | ICD-10-CM | POA: Insufficient documentation

## 2019-07-14 DIAGNOSIS — Z9841 Cataract extraction status, right eye: Secondary | ICD-10-CM | POA: Diagnosis not present

## 2019-07-14 DIAGNOSIS — I5022 Chronic systolic (congestive) heart failure: Secondary | ICD-10-CM | POA: Diagnosis not present

## 2019-07-14 DIAGNOSIS — H4312 Vitreous hemorrhage, left eye: Secondary | ICD-10-CM | POA: Insufficient documentation

## 2019-07-14 DIAGNOSIS — I480 Paroxysmal atrial fibrillation: Secondary | ICD-10-CM | POA: Diagnosis not present

## 2019-07-14 DIAGNOSIS — I4821 Permanent atrial fibrillation: Secondary | ICD-10-CM | POA: Insufficient documentation

## 2019-07-14 DIAGNOSIS — C61 Malignant neoplasm of prostate: Secondary | ICD-10-CM | POA: Diagnosis not present

## 2019-07-14 DIAGNOSIS — Z8546 Personal history of malignant neoplasm of prostate: Secondary | ICD-10-CM | POA: Insufficient documentation

## 2019-07-14 DIAGNOSIS — Z885 Allergy status to narcotic agent status: Secondary | ICD-10-CM | POA: Insufficient documentation

## 2019-07-14 DIAGNOSIS — Z800 Family history of malignant neoplasm of digestive organs: Secondary | ICD-10-CM | POA: Insufficient documentation

## 2019-07-14 DIAGNOSIS — I130 Hypertensive heart and chronic kidney disease with heart failure and stage 1 through stage 4 chronic kidney disease, or unspecified chronic kidney disease: Secondary | ICD-10-CM | POA: Insufficient documentation

## 2019-07-14 DIAGNOSIS — Z961 Presence of intraocular lens: Secondary | ICD-10-CM | POA: Insufficient documentation

## 2019-07-14 DIAGNOSIS — Z79899 Other long term (current) drug therapy: Secondary | ICD-10-CM | POA: Insufficient documentation

## 2019-07-14 DIAGNOSIS — H348321 Tributary (branch) retinal vein occlusion, left eye, with retinal neovascularization: Secondary | ICD-10-CM | POA: Diagnosis not present

## 2019-07-14 DIAGNOSIS — I700 Atherosclerosis of aorta: Secondary | ICD-10-CM | POA: Insufficient documentation

## 2019-07-14 DIAGNOSIS — Z96653 Presence of artificial knee joint, bilateral: Secondary | ICD-10-CM | POA: Diagnosis not present

## 2019-07-14 DIAGNOSIS — I429 Cardiomyopathy, unspecified: Secondary | ICD-10-CM | POA: Insufficient documentation

## 2019-07-14 HISTORY — PX: PARS PLANA VITRECTOMY 27 GAUGE: SHX6738

## 2019-07-14 HISTORY — DX: Unspecified glaucoma: H40.9

## 2019-07-14 HISTORY — DX: Vitreous hemorrhage, unspecified eye: H43.10

## 2019-07-14 HISTORY — PX: PARS PLANA VITRECTOMY: SHX2166

## 2019-07-14 HISTORY — PX: GAS INSERTION: SHX5336

## 2019-07-14 HISTORY — PX: PHOTOCOAGULATION WITH LASER: SHX6027

## 2019-07-14 HISTORY — DX: Presence of external hearing-aid: Z97.4

## 2019-07-14 HISTORY — DX: Presence of spectacles and contact lenses: Z97.3

## 2019-07-14 HISTORY — DX: Unspecified hearing loss, right ear: H91.91

## 2019-07-14 LAB — CBC
HCT: 41.4 % (ref 39.0–52.0)
Hemoglobin: 13.5 g/dL (ref 13.0–17.0)
MCH: 31.3 pg (ref 26.0–34.0)
MCHC: 32.6 g/dL (ref 30.0–36.0)
MCV: 95.8 fL (ref 80.0–100.0)
Platelets: 121 10*3/uL — ABNORMAL LOW (ref 150–400)
RBC: 4.32 MIL/uL (ref 4.22–5.81)
RDW: 14.1 % (ref 11.5–15.5)
WBC: 8.6 10*3/uL (ref 4.0–10.5)
nRBC: 0 % (ref 0.0–0.2)

## 2019-07-14 LAB — COMPREHENSIVE METABOLIC PANEL
ALT: 16 U/L (ref 0–44)
AST: 19 U/L (ref 15–41)
Albumin: 3.6 g/dL (ref 3.5–5.0)
Alkaline Phosphatase: 65 U/L (ref 38–126)
Anion gap: 10 (ref 5–15)
BUN: 13 mg/dL (ref 8–23)
CO2: 23 mmol/L (ref 22–32)
Calcium: 9.1 mg/dL (ref 8.9–10.3)
Chloride: 106 mmol/L (ref 98–111)
Creatinine, Ser: 1.29 mg/dL — ABNORMAL HIGH (ref 0.61–1.24)
GFR calc Af Amer: 56 mL/min — ABNORMAL LOW (ref >60)
GFR calc non Af Amer: 48 mL/min — ABNORMAL LOW (ref >60)
Glucose, Bld: 105 mg/dL — ABNORMAL HIGH (ref 70–99)
Potassium: 4 mmol/L (ref 3.5–5.1)
Sodium: 139 mmol/L (ref 135–145)
Total Bilirubin: 1.3 mg/dL — ABNORMAL HIGH (ref 0.3–1.2)
Total Protein: 5.9 g/dL — ABNORMAL LOW (ref 6.5–8.1)

## 2019-07-14 SURGERY — PARS PLANA VITRECTOMY 27 GAUGE
Anesthesia: General | Site: Eye | Laterality: Left

## 2019-07-14 MED ORDER — LACTATED RINGERS IV SOLN: INTRAVENOUS | Status: DC

## 2019-07-14 MED ORDER — FENTANYL CITRATE (PF) 250 MCG/5ML IJ SOLN
INTRAMUSCULAR | Status: AC
  Filled 2019-07-14: qty 5

## 2019-07-14 MED ORDER — BUPROPION HCL ER (XL) 150 MG PO TB24
150.0000 mg | ORAL_TABLET | Freq: Every day | ORAL | Status: DC
  Administered 2019-07-14: 150 mg via ORAL
  Filled 2019-07-14: qty 1

## 2019-07-14 MED ORDER — TORSEMIDE 20 MG PO TABS
20.0000 mg | ORAL_TABLET | Freq: Every day | ORAL | Status: DC
  Administered 2019-07-14: 20 mg via ORAL
  Filled 2019-07-14: qty 1

## 2019-07-14 MED ORDER — DORZOLAMIDE HCL 2 % OP SOLN: 1.0000 [drp] | Freq: Three times a day (TID) | OPHTHALMIC | Status: DC

## 2019-07-14 MED ORDER — SODIUM HYALURONATE 10 MG/ML IO SOLN
INTRAOCULAR | Status: DC | PRN
  Administered 2019-07-14: 0.85 mL via INTRAOCULAR

## 2019-07-14 MED ORDER — PHENYLEPHRINE HCL-NACL 10-0.9 MG/250ML-% IV SOLN
INTRAVENOUS | Status: DC | PRN
  Administered 2019-07-14: 25 ug/min via INTRAVENOUS

## 2019-07-14 MED ORDER — ONDANSETRON HCL 4 MG/2ML IJ SOLN
4.0000 mg | Freq: Four times a day (QID) | INTRAMUSCULAR | Status: DC
  Administered 2019-07-14 – 2019-07-15 (×3): 4 mg via INTRAVENOUS
  Filled 2019-07-14 (×3): qty 2

## 2019-07-14 MED ORDER — GATIFLOXACIN 0.5 % OP SOLN
1.0000 [drp] | OPHTHALMIC | Status: AC | PRN
  Administered 2019-07-14 (×3): 1 [drp] via OPHTHALMIC
  Filled 2019-07-14: qty 2.5

## 2019-07-14 MED ORDER — ACETAMINOPHEN 500 MG PO TABS
1000.0000 mg | ORAL_TABLET | Freq: Once | ORAL | Status: AC
  Administered 2019-07-14: 1000 mg via ORAL
  Filled 2019-07-14: qty 2

## 2019-07-14 MED ORDER — PREDNISOLONE ACETATE 1 % OP SUSP
1.0000 [drp] | Freq: Four times a day (QID) | OPHTHALMIC | Status: DC
  Filled 2019-07-14: qty 5

## 2019-07-14 MED ORDER — BSS PLUS IO SOLN
INTRAOCULAR | Status: AC
  Filled 2019-07-14: qty 500

## 2019-07-14 MED ORDER — ACETAMINOPHEN 325 MG PO TABS
325.0000 mg | ORAL_TABLET | ORAL | Status: DC | PRN
  Administered 2019-07-14 – 2019-07-15 (×2): 650 mg via ORAL
  Filled 2019-07-14 (×2): qty 2

## 2019-07-14 MED ORDER — CEFTAZIDIME 1 G IJ SOLR
INTRAMUSCULAR | Status: AC
  Filled 2019-07-14: qty 1

## 2019-07-14 MED ORDER — HYDROCODONE-ACETAMINOPHEN 5-325 MG PO TABS: 1.0000 | ORAL_TABLET | ORAL | Status: DC | PRN

## 2019-07-14 MED ORDER — BSS IO SOLN
INTRAOCULAR | Status: AC
  Filled 2019-07-14: qty 15

## 2019-07-14 MED ORDER — GATIFLOXACIN 0.5 % OP SOLN
1.0000 [drp] | Freq: Four times a day (QID) | OPHTHALMIC | Status: DC
  Filled 2019-07-14: qty 2.5

## 2019-07-14 MED ORDER — ATROPINE SULFATE 1 % OP SOLN
OPHTHALMIC | Status: AC
  Filled 2019-07-14: qty 5

## 2019-07-14 MED ORDER — HYALURONIDASE HUMAN 150 UNIT/ML IJ SOLN
INTRAMUSCULAR | Status: AC
  Filled 2019-07-14: qty 1

## 2019-07-14 MED ORDER — SODIUM HYALURONATE 10 MG/ML IO SOLN
INTRAOCULAR | Status: AC
  Filled 2019-07-14: qty 0.85

## 2019-07-14 MED ORDER — STERILE WATER FOR INJECTION IJ SOLN
INTRAMUSCULAR | Status: AC
  Filled 2019-07-14: qty 20

## 2019-07-14 MED ORDER — DEXAMETHASONE SODIUM PHOSPHATE 10 MG/ML IJ SOLN
INTRAMUSCULAR | Status: DC | PRN
  Administered 2019-07-14: 5 mg via INTRAVENOUS

## 2019-07-14 MED ORDER — FENTANYL CITRATE (PF) 100 MCG/2ML IJ SOLN
25.0000 ug | INTRAMUSCULAR | Status: DC | PRN
  Administered 2019-07-14 (×2): 25 ug via INTRAVENOUS

## 2019-07-14 MED ORDER — ROCURONIUM BROMIDE 10 MG/ML (PF) SYRINGE
PREFILLED_SYRINGE | INTRAVENOUS | Status: DC | PRN
  Administered 2019-07-14: 50 mg via INTRAVENOUS
  Administered 2019-07-14: 10 mg via INTRAVENOUS

## 2019-07-14 MED ORDER — DEXAMETHASONE SODIUM PHOSPHATE 10 MG/ML IJ SOLN
INTRAMUSCULAR | Status: AC
  Filled 2019-07-14: qty 1

## 2019-07-14 MED ORDER — EPINEPHRINE PF 1 MG/ML IJ SOLN
INTRAOCULAR | Status: DC | PRN
  Administered 2019-07-14: 500 mL

## 2019-07-14 MED ORDER — TAMSULOSIN HCL 0.4 MG PO CAPS
0.4000 mg | ORAL_CAPSULE | Freq: Every day | ORAL | Status: DC
  Administered 2019-07-14: 0.4 mg via ORAL
  Filled 2019-07-14: qty 1

## 2019-07-14 MED ORDER — NITROGLYCERIN 0.4 MG SL SUBL: 0.4000 mg | SUBLINGUAL_TABLET | SUBLINGUAL | Status: DC | PRN

## 2019-07-14 MED ORDER — MAGNESIUM HYDROXIDE 400 MG/5ML PO SUSP: 15.0000 mL | Freq: Four times a day (QID) | ORAL | Status: DC | PRN

## 2019-07-14 MED ORDER — FENTANYL CITRATE (PF) 100 MCG/2ML IJ SOLN
INTRAMUSCULAR | Status: AC
  Filled 2019-07-14: qty 2

## 2019-07-14 MED ORDER — LIDOCAINE 2% (20 MG/ML) 5 ML SYRINGE
INTRAMUSCULAR | Status: AC
  Filled 2019-07-14: qty 5

## 2019-07-14 MED ORDER — PHENYLEPHRINE HCL 2.5 % OP SOLN
1.0000 [drp] | OPHTHALMIC | Status: AC | PRN
  Administered 2019-07-14 (×3): 1 [drp] via OPHTHALMIC
  Filled 2019-07-14: qty 2

## 2019-07-14 MED ORDER — EPINEPHRINE PF 1 MG/ML IJ SOLN
INTRAMUSCULAR | Status: AC
  Filled 2019-07-14: qty 1

## 2019-07-14 MED ORDER — SUGAMMADEX SODIUM 200 MG/2ML IV SOLN
INTRAVENOUS | Status: DC | PRN
  Administered 2019-07-14 (×3): 100 mg via INTRAVENOUS

## 2019-07-14 MED ORDER — DORZOLAMIDE HCL-TIMOLOL MAL 2-0.5 % OP SOLN
1.0000 [drp] | Freq: Two times a day (BID) | OPHTHALMIC | Status: DC
  Administered 2019-07-14: 1 [drp] via OPHTHALMIC
  Filled 2019-07-14: qty 10

## 2019-07-14 MED ORDER — SODIUM CHLORIDE (PF) 0.9 % IJ SOLN
INTRAMUSCULAR | Status: AC
  Filled 2019-07-14: qty 10

## 2019-07-14 MED ORDER — CYCLOPENTOLATE HCL 1 % OP SOLN
1.0000 [drp] | OPHTHALMIC | Status: AC | PRN
  Administered 2019-07-14 (×3): 1 [drp] via OPHTHALMIC
  Filled 2019-07-14: qty 2

## 2019-07-14 MED ORDER — TRAMADOL HCL 50 MG PO TABS: 50.0000 mg | ORAL_TABLET | Freq: Four times a day (QID) | ORAL | Status: DC | PRN

## 2019-07-14 MED ORDER — TEMAZEPAM 15 MG PO CAPS: 15.0000 mg | ORAL_CAPSULE | Freq: Every evening | ORAL | Status: DC | PRN

## 2019-07-14 MED ORDER — POTASSIUM CHLORIDE CRYS ER 10 MEQ PO TBCR
10.0000 meq | EXTENDED_RELEASE_TABLET | Freq: Every day | ORAL | Status: DC
  Administered 2019-07-14: 10 meq via ORAL
  Filled 2019-07-14: qty 1

## 2019-07-14 MED ORDER — POLYMYXIN B SULFATE 500000 UNITS IJ SOLR
INTRAMUSCULAR | Status: AC
  Filled 2019-07-14: qty 500000

## 2019-07-14 MED ORDER — SODIUM CHLORIDE 0.45 % IV SOLN: INTRAVENOUS | Status: DC

## 2019-07-14 MED ORDER — BSS IO SOLN
INTRAOCULAR | Status: DC | PRN
  Administered 2019-07-14: 15 mL via INTRAOCULAR

## 2019-07-14 MED ORDER — TROPICAMIDE 1 % OP SOLN
1.0000 [drp] | OPHTHALMIC | Status: AC | PRN
  Administered 2019-07-14 (×3): 1 [drp] via OPHTHALMIC
  Filled 2019-07-14: qty 15

## 2019-07-14 MED ORDER — ACETAZOLAMIDE SODIUM 500 MG IJ SOLR
500.0000 mg | Freq: Once | INTRAMUSCULAR | Status: AC
  Administered 2019-07-15: 500 mg via INTRAVENOUS
  Filled 2019-07-14: qty 500

## 2019-07-14 MED ORDER — ONDANSETRON HCL 4 MG/2ML IJ SOLN
INTRAMUSCULAR | Status: DC | PRN
  Administered 2019-07-14: 4 mg via INTRAVENOUS

## 2019-07-14 MED ORDER — STERILE WATER FOR INJECTION IJ SOLN
INTRAMUSCULAR | Status: DC | PRN
  Administered 2019-07-14: 20 mL

## 2019-07-14 MED ORDER — DIAZEPAM 2 MG PO TABS
5.0000 mg | ORAL_TABLET | Freq: Two times a day (BID) | ORAL | Status: DC | PRN
  Administered 2019-07-14: 5 mg via ORAL
  Filled 2019-07-14: qty 3

## 2019-07-14 MED ORDER — 0.9 % SODIUM CHLORIDE (POUR BTL) OPTIME
TOPICAL | Status: DC | PRN
  Administered 2019-07-14: 1000 mL

## 2019-07-14 MED ORDER — ONDANSETRON HCL 4 MG/2ML IJ SOLN
INTRAMUSCULAR | Status: AC
  Filled 2019-07-14: qty 2

## 2019-07-14 MED ORDER — BRIMONIDINE TARTRATE 0.15 % OP SOLN
1.0000 [drp] | Freq: Once | OPHTHALMIC | Status: AC
  Administered 2019-07-14: 1 [drp] via OPHTHALMIC
  Filled 2019-07-14: qty 5

## 2019-07-14 MED ORDER — BUPIVACAINE HCL (PF) 0.75 % IJ SOLN
INTRAMUSCULAR | Status: DC | PRN
  Administered 2019-07-14: 10 mL

## 2019-07-14 MED ORDER — LIDOCAINE HCL 2 % IJ SOLN
INTRAMUSCULAR | Status: AC
  Filled 2019-07-14: qty 20

## 2019-07-14 MED ORDER — ROCURONIUM BROMIDE 10 MG/ML (PF) SYRINGE
PREFILLED_SYRINGE | INTRAVENOUS | Status: AC
  Filled 2019-07-14: qty 10

## 2019-07-14 MED ORDER — HYPROMELLOSE (GONIOSCOPIC) 2.5 % OP SOLN
OPHTHALMIC | Status: AC
  Filled 2019-07-14: qty 15

## 2019-07-14 MED ORDER — PHENYLEPHRINE 40 MCG/ML (10ML) SYRINGE FOR IV PUSH (FOR BLOOD PRESSURE SUPPORT)
PREFILLED_SYRINGE | INTRAVENOUS | Status: DC | PRN
  Administered 2019-07-14: 80 ug via INTRAVENOUS

## 2019-07-14 MED ORDER — BRIMONIDINE TARTRATE 0.15 % OP SOLN: 1.0000 [drp] | Freq: Two times a day (BID) | OPHTHALMIC | Status: DC

## 2019-07-14 MED ORDER — DEXAMETHASONE SODIUM PHOSPHATE 10 MG/ML IJ SOLN
INTRAMUSCULAR | Status: DC | PRN
  Administered 2019-07-14: 10 mg

## 2019-07-14 MED ORDER — PROPOFOL 10 MG/ML IV BOLUS
INTRAVENOUS | Status: DC | PRN
  Administered 2019-07-14: 80 mg via INTRAVENOUS

## 2019-07-14 MED ORDER — FENTANYL CITRATE (PF) 250 MCG/5ML IJ SOLN
INTRAMUSCULAR | Status: DC | PRN
  Administered 2019-07-14: 100 ug via INTRAVENOUS

## 2019-07-14 MED ORDER — BACITRACIN-POLYMYXIN B 500-10000 UNIT/GM OP OINT
1.0000 "application " | TOPICAL_OINTMENT | Freq: Three times a day (TID) | OPHTHALMIC | Status: DC
  Filled 2019-07-14: qty 3.5

## 2019-07-14 MED ORDER — PHENYLEPHRINE 40 MCG/ML (10ML) SYRINGE FOR IV PUSH (FOR BLOOD PRESSURE SUPPORT)
PREFILLED_SYRINGE | INTRAVENOUS | Status: AC
  Filled 2019-07-14: qty 10

## 2019-07-14 MED ORDER — ADULT MULTIVITAMIN W/MINERALS CH: 1.0000 | ORAL_TABLET | Freq: Every day | ORAL | Status: DC

## 2019-07-14 MED ORDER — BACITRACIN-POLYMYXIN B 500-10000 UNIT/GM OP OINT
TOPICAL_OINTMENT | OPHTHALMIC | Status: DC | PRN
  Administered 2019-07-14: 1 via OPHTHALMIC

## 2019-07-14 MED ORDER — CYCLOBENZAPRINE HCL 5 MG PO TABS: 2.5000 mg | ORAL_TABLET | Freq: Three times a day (TID) | ORAL | Status: DC | PRN

## 2019-07-14 MED ORDER — MORPHINE SULFATE (PF) 2 MG/ML IV SOLN
1.0000 mg | INTRAVENOUS | Status: DC | PRN
  Administered 2019-07-14: 1 mg via INTRAVENOUS
  Filled 2019-07-14: qty 1

## 2019-07-14 MED ORDER — BACITRACIN-POLYMYXIN B 500-10000 UNIT/GM OP OINT
TOPICAL_OINTMENT | OPHTHALMIC | Status: AC
  Filled 2019-07-14: qty 3.5

## 2019-07-14 MED ORDER — SODIUM CHLORIDE 0.9 % IV SOLN: INTRAVENOUS | Status: DC

## 2019-07-14 MED ORDER — CEFAZOLIN SODIUM-DEXTROSE 2-4 GM/100ML-% IV SOLN
2.0000 g | INTRAVENOUS | Status: AC
  Administered 2019-07-14: 2 g via INTRAVENOUS
  Filled 2019-07-14: qty 100

## 2019-07-14 MED ORDER — PANTOPRAZOLE SODIUM 40 MG PO TBEC
40.0000 mg | DELAYED_RELEASE_TABLET | Freq: Every day | ORAL | Status: DC
  Administered 2019-07-14: 40 mg via ORAL
  Filled 2019-07-14: qty 1

## 2019-07-14 MED ORDER — ATROPINE SULFATE 1 % OP SOLN
OPHTHALMIC | Status: DC | PRN
  Administered 2019-07-14: 1 [drp] via OPHTHALMIC

## 2019-07-14 MED ORDER — ACETAZOLAMIDE SODIUM 500 MG IJ SOLR
INTRAMUSCULAR | Status: AC
  Filled 2019-07-14: qty 500

## 2019-07-14 MED ORDER — TRIAMCINOLONE ACETONIDE 40 MG/ML IJ SUSP
INTRAMUSCULAR | Status: AC
  Filled 2019-07-14: qty 5

## 2019-07-14 MED ORDER — LATANOPROST 0.005 % OP SOLN
1.0000 [drp] | Freq: Every day | OPHTHALMIC | Status: DC
  Filled 2019-07-14: qty 2.5

## 2019-07-14 MED ORDER — BUPIVACAINE HCL (PF) 0.75 % IJ SOLN
INTRAMUSCULAR | Status: AC
  Filled 2019-07-14: qty 10

## 2019-07-14 MED ORDER — TETRACAINE HCL 0.5 % OP SOLN
2.0000 [drp] | Freq: Once | OPHTHALMIC | Status: DC
  Filled 2019-07-14: qty 4

## 2019-07-14 MED ORDER — BRIMONIDINE TARTRATE 0.2 % OP SOLN
1.0000 [drp] | Freq: Two times a day (BID) | OPHTHALMIC | Status: DC
  Filled 2019-07-14: qty 5

## 2019-07-14 MED ORDER — TRAZODONE HCL 50 MG PO TABS: 50.0000 mg | ORAL_TABLET | Freq: Every evening | ORAL | Status: DC | PRN

## 2019-07-14 MED ORDER — LIDOCAINE 2% (20 MG/ML) 5 ML SYRINGE
INTRAMUSCULAR | Status: DC | PRN
  Administered 2019-07-14: 20 mg via INTRAVENOUS

## 2019-07-14 SURGICAL SUPPLY — 46 items
APL SRG 3 HI ABS STRL LF PLS (MISCELLANEOUS) ×2
APPLICATOR DR MATTHEWS STRL (MISCELLANEOUS) ×2 IMPLANT
BALL CTTN LRG ABS STRL LF (GAUZE/BANDAGES/DRESSINGS) ×6
BNDG EYE OVAL (GAUZE/BANDAGES/DRESSINGS) ×4 IMPLANT
CABLE BIPOLOR RESECTION CORD (MISCELLANEOUS) ×2 IMPLANT
CANNULA VLV SOFT TIP 27G (OPHTHALMIC) ×2 IMPLANT
CANNULA VLV SOFT TIP 27GA (OPHTHALMIC) ×4 IMPLANT
COTTONBALL LRG STERILE PKG (GAUZE/BANDAGES/DRESSINGS) ×12 IMPLANT
COVER WAND RF STERILE (DRAPES) ×4 IMPLANT
DRAPE INCISE 51X51 W/FILM STRL (DRAPES) ×2 IMPLANT
DRAPE OPHTHALMIC 77X100 STRL (CUSTOM PROCEDURE TRAY) ×4 IMPLANT
FORCEPS GRIESHABER ILM 27G (INSTRUMENTS) ×4 IMPLANT
GLOVE SS BIOGEL STRL SZ 6.5 (GLOVE) ×2 IMPLANT
GLOVE SS BIOGEL STRL SZ 7 (GLOVE) ×2 IMPLANT
GLOVE SUPERSENSE BIOGEL SZ 6.5 (GLOVE) ×2
GLOVE SUPERSENSE BIOGEL SZ 7 (GLOVE) ×2
GLOVE SURG 8.5 LATEX PF (GLOVE) ×4 IMPLANT
GLOVE TRIUMPH SURG SIZE 8.5 (KITS) ×4 IMPLANT
GOWN STRL REUS W/ TWL LRG LVL3 (GOWN DISPOSABLE) ×6 IMPLANT
GOWN STRL REUS W/TWL LRG LVL3 (GOWN DISPOSABLE) ×12
KIT BASIN OR (CUSTOM PROCEDURE TRAY) ×4 IMPLANT
NDL 18GX1X1/2 (RX/OR ONLY) (NEEDLE) ×2 IMPLANT
NDL PRECISIONGLIDE 27X1.5 (NEEDLE) ×2 IMPLANT
NEEDLE 18GX1X1/2 (RX/OR ONLY) (NEEDLE) ×12 IMPLANT
NEEDLE PRECISIONGLIDE 27X1.5 (NEEDLE) ×4 IMPLANT
NS IRRIG 1000ML POUR BTL (IV SOLUTION) ×4 IMPLANT
PACK VITRECTOMY CUSTOM (CUSTOM PROCEDURE TRAY) ×4 IMPLANT
PAD ARMBOARD 7.5X6 YLW CONV (MISCELLANEOUS) ×8 IMPLANT
PAK VITRECTOMY PIK  27GA (OPHTHALMIC) ×4
PAK VITRECTOMY PIK 27GA (OPHTHALMIC) ×2 IMPLANT
PIC ILLUMINATED 25G (OPHTHALMIC) ×4
PIK ILLUMINATED 25G (OPHTHALMIC) ×2 IMPLANT
PROBE DIATHERMY DSP 27GA (MISCELLANEOUS) ×4 IMPLANT
PROBE LASER ILLUM FLEX 27GA (OPHTHALMIC) ×8 IMPLANT
ROLLS DENTAL (MISCELLANEOUS) ×8 IMPLANT
SCISSORS TIP ADVANCED DSP 25GA (INSTRUMENTS) ×2 IMPLANT
SHIELD EYE LENSE ONLY DISP (GAUZE/BANDAGES/DRESSINGS) ×2 IMPLANT
SPONGE SURGIFOAM ABS GEL 12-7 (HEMOSTASIS) ×4 IMPLANT
SYR 10ML LL (SYRINGE) ×4 IMPLANT
SYR 20ML LL LF (SYRINGE) ×4 IMPLANT
SYR BULB EAR ULCER 3OZ GRN STR (SYRINGE) ×4 IMPLANT
SYR TB 1ML LUER SLIP (SYRINGE) ×4 IMPLANT
TAPE SURG TRANSPORE 1 IN (GAUZE/BANDAGES/DRESSINGS) IMPLANT
TAPE SURGICAL TRANSPORE 1 IN (GAUZE/BANDAGES/DRESSINGS) ×4
TOWEL GREEN STERILE FF (TOWEL DISPOSABLE) ×4 IMPLANT
WATER STERILE IRR 1000ML POUR (IV SOLUTION) ×4 IMPLANT

## 2019-07-14 NOTE — H&P (Signed)
I examined the patient today and there is no change in the medical status 

## 2019-07-14 NOTE — Brief Op Note (Signed)
Brief Operative note   Preoperative diagnosis:  vitreous hemorrhage left eye Postoperative diagnosis Branch retinal vein occlusion with vitreous hemorrhage and neovascularization   Procedures: Pars plana vitrectomy, laser, gas injection left eye  Surgeon:  Hayden Pedro, MD...  Assistant:  Deatra Ina SA   Anesthesia: General  Specimen: none  Estimated blood loss:  1cc  Complications: none  Patient sent to PACU in good condition  Composed by Hayden Pedro MD  Dictation number: [phone]

## 2019-07-14 NOTE — Anesthesia Postprocedure Evaluation (Signed)
Anesthesia Post Note  Patient: [name]  Procedure(s) Performed: PARS PLANA VITRECTOMY 27 GAUGE (Left Eye) INSERTION OF GAS (Left ) PHOTOCOAGULATION WITH LASER (Left Eye)     Patient location during evaluation: PACU Anesthesia Type: General Level of consciousness: awake and alert Pain management: pain level controlled Vital Signs Assessment: post-procedure vital signs reviewed and stable Respiratory status: spontaneous breathing, nonlabored ventilation and respiratory function stable Cardiovascular status: blood pressure returned to baseline and stable Postop Assessment: no apparent nausea or vomiting Anesthetic complications: no    Last Vitals:  Vitals:   07/14/19 1429 07/14/19 1459  BP: 106/70 119/72  Pulse: 80 80  Resp: 15 15  Temp:    SpO2: 92% 96%    Last Pain:  Vitals:   07/14/19 1459  PainSc: 0-No pain                 Angelamarie Avakian,W. EDMOND

## 2019-07-14 NOTE — Transfer of Care (Signed)
Immediate Anesthesia Transfer of Care Note  Patient: [name]  Procedure(s) Performed: PARS PLANA VITRECTOMY 27 GAUGE (Left Eye) INSERTION OF GAS (Left ) PHOTOCOAGULATION WITH LASER (Left Eye)  Patient Location: PACU  Anesthesia Type:General  Level of Consciousness: awake, alert  and oriented  Airway & Oxygen Therapy: Patient Spontanous Breathing and Patient connected to nasal cannula oxygen  Post-op Assessment: Report given to RN and Post -op Vital signs reviewed and stable  Post vital signs: Reviewed and stable  Last Vitals:  Vitals Value Taken Time  BP 121/78 07/14/19 1414  Temp    Pulse 78 07/14/19 1416  Resp 15 07/14/19 1416  SpO2 93 % 07/14/19 1416  Vitals shown include unvalidated device data.  Last Pain:  Vitals:   07/14/19 0948  PainSc: 0-No pain         Complications: No apparent anesthesia complications

## 2019-07-14 NOTE — Anesthesia Procedure Notes (Signed)
Procedure Name: Intubation Date/Time: 07/14/2019 12:51 PM Performed by: Trinna Post., CRNA Pre-anesthesia Checklist: Patient identified, Emergency Drugs available, Suction available, Patient being monitored and Timeout performed Patient Re-evaluated:Patient Re-evaluated prior to induction Oxygen Delivery Method: Circle system utilized Preoxygenation: Pre-oxygenation with 100% oxygen Induction Type: IV induction Ventilation: Mask ventilation without difficulty Laryngoscope Size: Mac and 4 Grade View: Grade I Tube type: Oral Tube size: 7.5 mm Number of attempts: 1 Airway Equipment and Method: Stylet Placement Confirmation: ETT inserted through vocal cords under direct vision,  positive ETCO2 and breath sounds checked- equal and bilateral Secured at: 22 cm Tube secured with: Tape Dental Injury: Teeth and Oropharynx as per pre-operative assessment

## 2019-07-14 NOTE — Op Note (Signed)
NAME: [name], [place] MEDICAL RECORD [id number] ACCOUNT [id number] DATE OF BIRTH:01-Apr-1926 FACILITY: [facility] LOCATION: [place], [place]  OPERATIVE REPORT  DATE OF PROCEDURE:  07/14/2019  ADMISSION DIAGNOSES:   1.  Vitreous hemorrhage.  2.  Neovascularization.  3.  Branch retinal vein occlusion, all in the left eye.  PROCEDURES:   1.  Pars plana vitrectomy.  2.  Retinal photocoagulation. 3.  Gas fluid exchange, all in the left eye.  SURGEON:  Tempie Hoist, MD  ASSISTANT:   Deatra Ina, SA.  ANESTHESIA:  General.  DESCRIPTION OF PROCEDURE:  After proper endotracheal anesthesia was obtained, the usual prep and drape was performed.  Attention was carried to the pars plana area.  The 27-gauge trocars were placed at 10, 2 and 4 o'clock with an infusion at 4 o'clock.   Provisc was placed on the corneal surface.  The 27-gauge lighted pick and cutter were placed in the trocars at 10 and 2 o'clock, infusion at 4 o'clock.   The pars plana vitrectomy was begun just behind the pseudophacos.  Dense white blood was encountered  and leathery white thick membranes were encountered, along with pigmented membranes. The central area of vitreous debris was cleared and layer by layer, the vitreous was removed, along with old white blood until the optic disk came into view.  Several  areas of neovascularization were seen above the optic nerve and 2 were seen below the optic nerve.  Continuous vacuuming of the white muddy fluid was then performed until the entire red retina with proper vessels and a proper macula was seen.  The  vitrectomy was carried into the far periphery with the BIOM viewing system and the leathery membranes were trimmed back to the vitreous base for 360 degrees.  Much time was required to remove all of this white muddy debris.  The endolaser was positioned  in the eye and it was used to directly treat the neovascularization and a panretinal photocoagulation  style laser procedure was performed with a 664 burns between 300 and 400 milliwatts, 1000 microns each and 0.1 seconds each.  Additional vacuuming and  additional removal of the peripheral gray/brown/white membranes were carried out until every membrane was reached.  His eye was so close in the orbit that a Q-tip could not be placed between the orbital wall and the globe for a scleral depression  posteriorly.  Once all of the membranes were removed that could be removed, and the neovascularization was sealed, a total gas fluid exchange was carried out with the silicone tipped brush.  The instruments were removed from the eye and the 27-gauge  trocars were removed from the eye.  The wounds were tested and found to be secure.  Polymyxin and ceftazidime were rinsed around the globe for antibiotic coverage.  Decadron 10 mg was injected into the lower subconjunctival space.  Atropine solution was  applied.  Marcaine was injected around the globe for postoperative pain.  The closing pressure was 10 with a Barraquer tonometer.  Atropine solution was applied as well, Polysporin ophthalmic ointment, a patch and a shield were placed.  The patient was  awakened and taken to recovery in satisfactory condition.  VN/NUANCE  D:07/14/2019 T:07/14/2019 JOB:[id number]

## 2019-07-15 DIAGNOSIS — F329 Major depressive disorder, single episode, unspecified: Secondary | ICD-10-CM | POA: Diagnosis not present

## 2019-07-15 DIAGNOSIS — F419 Anxiety disorder, unspecified: Secondary | ICD-10-CM | POA: Diagnosis not present

## 2019-07-15 DIAGNOSIS — H4312 Vitreous hemorrhage, left eye: Secondary | ICD-10-CM | POA: Diagnosis not present

## 2019-07-15 DIAGNOSIS — G4730 Sleep apnea, unspecified: Secondary | ICD-10-CM | POA: Diagnosis not present

## 2019-07-15 DIAGNOSIS — I252 Old myocardial infarction: Secondary | ICD-10-CM | POA: Diagnosis not present

## 2019-07-15 DIAGNOSIS — K219 Gastro-esophageal reflux disease without esophagitis: Secondary | ICD-10-CM | POA: Diagnosis not present

## 2019-07-15 DIAGNOSIS — H348321 Tributary (branch) retinal vein occlusion, left eye, with retinal neovascularization: Secondary | ICD-10-CM | POA: Diagnosis not present

## 2019-07-15 DIAGNOSIS — I2510 Atherosclerotic heart disease of native coronary artery without angina pectoris: Secondary | ICD-10-CM | POA: Diagnosis not present

## 2019-07-15 DIAGNOSIS — Z955 Presence of coronary angioplasty implant and graft: Secondary | ICD-10-CM | POA: Diagnosis not present

## 2019-07-15 MED ORDER — PREDNISOLONE ACETATE 1 % OP SUSP: 1.0000 [drp] | Freq: Four times a day (QID) | OPHTHALMIC | 0 refills | Status: DC

## 2019-07-15 MED ORDER — GATIFLOXACIN 0.5 % OP SOLN: 1.0000 [drp] | Freq: Four times a day (QID) | OPHTHALMIC | Status: DC

## 2019-07-15 MED ORDER — BACITRACIN-POLYMYXIN B 500-10000 UNIT/GM OP OINT: 1.0000 "application " | TOPICAL_OINTMENT | Freq: Three times a day (TID) | OPHTHALMIC | 0 refills | Status: DC

## 2019-07-15 NOTE — Plan of Care (Signed)
  Problem: Education: Goal: Knowledge of post-operative educational information will improve Outcome: Progressing   Problem: Safety: Goal: Ability to remain free from injury will improve Outcome: Progressing   Problem: Activity: Goal: Risk for activity intolerance will decrease Outcome: Progressing   

## 2019-07-15 NOTE — Progress Notes (Signed)
07/15/2019, 6:36 AM  Mental Status:  Awake, Alert, Oriented  Anterior segment: Cornea  Clear    Anterior Chamber Clear    Lens:   Clear, IOL, Cataract  Intra Ocular Pressure 04 mmHg with Tonopen  Vitreous: Clear 70%gas bubble   Retina:  Attached Good laser reaction   Impression: Excellent result Retina attached   Final Diagnosis: Principal Problem:   Vitreous hemorrhage, left (HCC) Active Problems:   Vitreous hemorrhage, left eye (HCC)   Plan: start post operative eye drops.  Discharge to home.  Give post operative instructions  [name] 07/15/2019, 6:36 AM

## 2019-07-21 ENCOUNTER — Other Ambulatory Visit: Payer: Self-pay

## 2019-07-21 ENCOUNTER — Encounter (INDEPENDENT_AMBULATORY_CARE_PROVIDER_SITE_OTHER): Payer: Medicare HMO | Admitting: Ophthalmology

## 2019-07-21 DIAGNOSIS — H348322 Tributary (branch) retinal vein occlusion, left eye, stable: Secondary | ICD-10-CM

## 2019-07-28 ENCOUNTER — Ambulatory Visit (INDEPENDENT_AMBULATORY_CARE_PROVIDER_SITE_OTHER): Payer: Medicare HMO

## 2019-07-28 DIAGNOSIS — Z950 Presence of cardiac pacemaker: Secondary | ICD-10-CM | POA: Diagnosis not present

## 2019-07-28 DIAGNOSIS — I5022 Chronic systolic (congestive) heart failure: Secondary | ICD-10-CM | POA: Diagnosis not present

## 2019-07-31 NOTE — Progress Notes (Signed)
EPIC Encounter for ICM Monitoring  Patient Name: [name] is a 86 y.o. male Date: 07/31/2019 Primary Care Physican: Lawerance Cruel, MD Primary Cardiologist:[place]  Electrophysiologist:Taylor 3/26/2021Weight:180 lbs   Spoke with patient and reports feeling well at this time.  Denies fluid symptoms.    CorVuethoracic impedancenormal.  Prescribed: Torsemide 20 mgTake 20-40 mg by mouth daily. Take one extra tablet of torsemide by mouth if weight gain of 3 pounds Potassium 10 mEqTake 10 mEq by mouth daily.  Labs: 05/18/2019 Creatinine 1.32, BUN 19, Potassium 3.8, Sodium 136, GFR 47-55 05/17/2019 Creatinine 1.07, BUN 16, Potassium 3.7, Sodium 136, GFR >60 05/16/2019 Creatinine 1.23, BUN 16, Potassium 3.9, Sodium 138, GFR 51-60 02/06/2019 Creatinine1.57, BUN22, Potassium3.5, Sodium137,GFR38-44 09/09/2018 Creatinine1.52, BUN18, Potassium4.2, Sodium130, CBI37-79 08/19/2018 Creatinine1.32, BUN20, Potassium3.8, Sodium136, ZPS88-64 A complete set of results can be found in Results Review.  Recommendations: No changes and encouraged to call if experiencing any fluid symptoms.   Follow-up plan: ICM clinic phone appointment on7/01/2020. 91 day device clinic remote transmission6/24/2021.    Copy of ICM check sent to Dr.Taylor  3 month ICM trend: 07/29/2019    1 Year ICM trend:       Rosalene Billings, RN 07/31/2019 9:42 AM

## 2019-08-13 ENCOUNTER — Other Ambulatory Visit: Payer: Self-pay

## 2019-08-13 ENCOUNTER — Encounter (INDEPENDENT_AMBULATORY_CARE_PROVIDER_SITE_OTHER): Payer: Medicare HMO | Admitting: Ophthalmology

## 2019-08-13 DIAGNOSIS — H348322 Tributary (branch) retinal vein occlusion, left eye, stable: Secondary | ICD-10-CM

## 2019-08-20 ENCOUNTER — Ambulatory Visit (INDEPENDENT_AMBULATORY_CARE_PROVIDER_SITE_OTHER): Payer: Medicare HMO | Admitting: *Deleted

## 2019-08-20 DIAGNOSIS — I442 Atrioventricular block, complete: Secondary | ICD-10-CM

## 2019-08-21 ENCOUNTER — Telehealth: Payer: Self-pay

## 2019-08-21 NOTE — Telephone Encounter (Signed)
Spoke with patient to remind of missed remote transmission 

## 2019-08-24 NOTE — Progress Notes (Signed)
Remote pacemaker transmission.   

## 2019-09-07 ENCOUNTER — Ambulatory Visit (INDEPENDENT_AMBULATORY_CARE_PROVIDER_SITE_OTHER): Payer: Medicare HMO

## 2019-09-07 DIAGNOSIS — I5022 Chronic systolic (congestive) heart failure: Secondary | ICD-10-CM | POA: Diagnosis not present

## 2019-09-07 DIAGNOSIS — Z950 Presence of cardiac pacemaker: Secondary | ICD-10-CM

## 2019-09-09 ENCOUNTER — Telehealth: Payer: Self-pay

## 2019-09-09 NOTE — Telephone Encounter (Signed)
I called pt about missed transmission. Transmission received. The pt wants me to tell [place]. He states she is very sweet lady.

## 2019-09-09 NOTE — Progress Notes (Addendum)
EPIC Encounter for ICM Monitoring  Patient Name: [name] is a 86 y.o. male Date: 09/09/2019 Primary Care Physican: Lawerance Cruel, MD Primary Cardiologist:[place]an[place]  Electrophysiologist:Taylor 7/14/2021Weight:180 lbs   Spoke with patient and reports feeling well at this time.  Denies fluid symptoms.  He reports he normally takes 20 mg of Torsemide daily  CorVuethoracic impedancesuggesting possible fluid accumulation since 09/08/19.  Prescribed: Torsemide 20 mgTake 20-40 mg by mouth daily. Take one extra tablet of torsemide by mouth if weight gain of 3 pounds Potassium 10 mEqTake 10 mEq by mouth daily.  Labs: 05/18/2019 Creatinine 1.32, BUN 19, Potassium 3.8, Sodium 136, GFR 47-55 05/17/2019 Creatinine 1.07, BUN 16, Potassium 3.7, Sodium 136, GFR >60 05/16/2019 Creatinine 1.23, BUN 16, Potassium 3.9, Sodium 138, GFR 51-60 A complete set of results can be found in Results Review..  Recommendations:  Advised to take 40 mg of Torsemide as prescription allows x 1 day and then return to 20 mg daily.  Advised to limit salt and fluid intake.  Follow-up plan: ICM clinic phone appointment on 10/12/2019.   91 day device clinic remote transmission 11/19/2019.    EP/Cardiology Office Visits: Recalls 10/03/2019 with Dr. Irish Lack and 05/02/2020 with [name].    Copy of ICM check sent to Dr. Lovena Le and Dr Irish Lack for review/FYI.   3 month ICM trend: 09/09/2019    1 Year ICM trend:       Rosalene Billings, RN 09/09/2019 12:23 PM

## 2019-09-10 DIAGNOSIS — M5416 Radiculopathy, lumbar region: Secondary | ICD-10-CM | POA: Diagnosis not present

## 2019-09-11 DIAGNOSIS — G8929 Other chronic pain: Secondary | ICD-10-CM | POA: Diagnosis not present

## 2019-09-11 DIAGNOSIS — N400 Enlarged prostate without lower urinary tract symptoms: Secondary | ICD-10-CM | POA: Diagnosis not present

## 2019-09-11 DIAGNOSIS — C61 Malignant neoplasm of prostate: Secondary | ICD-10-CM | POA: Diagnosis not present

## 2019-09-11 DIAGNOSIS — E785 Hyperlipidemia, unspecified: Secondary | ICD-10-CM | POA: Diagnosis not present

## 2019-09-11 DIAGNOSIS — H4921 Sixth [abducent] nerve palsy, right eye: Secondary | ICD-10-CM | POA: Diagnosis not present

## 2019-09-11 DIAGNOSIS — F329 Major depressive disorder, single episode, unspecified: Secondary | ICD-10-CM | POA: Diagnosis not present

## 2019-09-11 DIAGNOSIS — M4186 Other forms of scoliosis, lumbar region: Secondary | ICD-10-CM | POA: Diagnosis not present

## 2019-09-11 DIAGNOSIS — I2510 Atherosclerotic heart disease of native coronary artery without angina pectoris: Secondary | ICD-10-CM | POA: Diagnosis not present

## 2019-09-11 DIAGNOSIS — M5116 Intervertebral disc disorders with radiculopathy, lumbar region: Secondary | ICD-10-CM | POA: Diagnosis not present

## 2019-09-11 DIAGNOSIS — M9973 Connective tissue and disc stenosis of intervertebral foramina of lumbar region: Secondary | ICD-10-CM | POA: Diagnosis not present

## 2019-09-22 DIAGNOSIS — Z9889 Other specified postprocedural states: Secondary | ICD-10-CM | POA: Diagnosis not present

## 2019-09-22 DIAGNOSIS — H401123 Primary open-angle glaucoma, left eye, severe stage: Secondary | ICD-10-CM | POA: Diagnosis not present

## 2019-09-22 DIAGNOSIS — H353131 Nonexudative age-related macular degeneration, bilateral, early dry stage: Secondary | ICD-10-CM | POA: Diagnosis not present

## 2019-09-22 DIAGNOSIS — Z961 Presence of intraocular lens: Secondary | ICD-10-CM | POA: Diagnosis not present

## 2019-09-22 DIAGNOSIS — H401112 Primary open-angle glaucoma, right eye, moderate stage: Secondary | ICD-10-CM | POA: Diagnosis not present

## 2019-10-07 DIAGNOSIS — R2689 Other abnormalities of gait and mobility: Secondary | ICD-10-CM | POA: Diagnosis not present

## 2019-10-07 DIAGNOSIS — F1190 Opioid use, unspecified, uncomplicated: Secondary | ICD-10-CM | POA: Diagnosis not present

## 2019-10-07 DIAGNOSIS — M5416 Radiculopathy, lumbar region: Secondary | ICD-10-CM | POA: Diagnosis not present

## 2019-10-07 DIAGNOSIS — M5136 Other intervertebral disc degeneration, lumbar region: Secondary | ICD-10-CM | POA: Diagnosis not present

## 2019-10-07 DIAGNOSIS — M545 Low back pain: Secondary | ICD-10-CM | POA: Diagnosis not present

## 2019-10-12 ENCOUNTER — Ambulatory Visit (INDEPENDENT_AMBULATORY_CARE_PROVIDER_SITE_OTHER): Payer: Medicare HMO

## 2019-10-12 DIAGNOSIS — I5022 Chronic systolic (congestive) heart failure: Secondary | ICD-10-CM

## 2019-10-12 DIAGNOSIS — Z950 Presence of cardiac pacemaker: Secondary | ICD-10-CM | POA: Diagnosis not present

## 2019-10-15 ENCOUNTER — Telehealth: Payer: Self-pay

## 2019-10-15 NOTE — Telephone Encounter (Signed)
I spoke with the pt to let him know we did not receive his transmission. He agreed to send one. I told him I will call him back to let him know if we got the transmission.

## 2019-10-16 ENCOUNTER — Telehealth: Payer: Self-pay

## 2019-10-16 DIAGNOSIS — L570 Actinic keratosis: Secondary | ICD-10-CM | POA: Diagnosis not present

## 2019-10-16 DIAGNOSIS — D0439 Carcinoma in situ of skin of other parts of face: Secondary | ICD-10-CM | POA: Diagnosis not present

## 2019-10-16 DIAGNOSIS — Z85828 Personal history of other malignant neoplasm of skin: Secondary | ICD-10-CM | POA: Diagnosis not present

## 2019-10-16 DIAGNOSIS — L244 Irritant contact dermatitis due to drugs in contact with skin: Secondary | ICD-10-CM | POA: Diagnosis not present

## 2019-10-16 NOTE — Telephone Encounter (Signed)
Remote ICM transmission received.  Attempted call to patient regarding ICM remote transmission and left message per DPR to return call.   

## 2019-10-16 NOTE — Progress Notes (Signed)
EPIC Encounter for ICM Monitoring  Patient Name: [name] is a 86 y.o. male Date: 10/16/2019 Primary Care Physican: Lawerance Cruel, MD Primary Cardiologist:Varanasi  Electrophysiologist:Taylor 7/14/2021Weight:180 lbs   Attempted call to patient and unable to reach.  Left message to return call. Transmission reviewed.   CorVuethoracic impedancesuggesting possible fluid accumulation since 09/08/19.  Prescribed: Torsemide 20 mgTake 20-40 mg by mouth daily. Take one extra tablet of torsemide by mouth if weight gain of 3 pounds Potassium 10 mEqTake 10 mEq by mouth daily.  Labs: 05/18/2019 Creatinine 1.32, BUN 19, Potassium 3.8, Sodium 136, GFR 47-55 05/17/2019 Creatinine 1.07, BUN 16, Potassium 3.7, Sodium 136, GFR >60 05/16/2019 Creatinine 1.23, BUN 16, Potassium 3.9, Sodium 138, GFR 51-60 A complete set of results can be found in Results Review..  Recommendations:  Unable to reach.    Follow-up plan: ICM clinic phone appointment on 11/16/2019.   91 day device clinic remote transmission 11/19/2019.    EP/Cardiology Office Visits: Recalls 10/03/2019 with Dr. Irish Lack and 05/02/2020 with [name].    Copy of ICM check sent to Dr. Lovena Le.  3 month ICM trend: 10/15/2019    1 Year ICM trend:       Rosalene Billings, RN 10/16/2019 12:27 PM

## 2019-10-16 NOTE — Progress Notes (Signed)
Spoke with patient and is doing well.  He denies any fluid symptoms.  Transmission reviewed.  No changes and encouraged to call if experiencing any fluid symptoms.

## 2019-10-22 ENCOUNTER — Encounter (INDEPENDENT_AMBULATORY_CARE_PROVIDER_SITE_OTHER): Payer: Medicare HMO | Admitting: Ophthalmology

## 2019-10-22 ENCOUNTER — Other Ambulatory Visit: Payer: Self-pay

## 2019-10-22 DIAGNOSIS — H35033 Hypertensive retinopathy, bilateral: Secondary | ICD-10-CM

## 2019-10-22 DIAGNOSIS — I10 Essential (primary) hypertension: Secondary | ICD-10-CM

## 2019-10-22 DIAGNOSIS — H353132 Nonexudative age-related macular degeneration, bilateral, intermediate dry stage: Secondary | ICD-10-CM | POA: Diagnosis not present

## 2019-10-22 DIAGNOSIS — H43813 Vitreous degeneration, bilateral: Secondary | ICD-10-CM

## 2019-10-29 DIAGNOSIS — M25551 Pain in right hip: Secondary | ICD-10-CM | POA: Diagnosis not present

## 2019-10-29 DIAGNOSIS — Z79891 Long term (current) use of opiate analgesic: Secondary | ICD-10-CM | POA: Diagnosis not present

## 2019-10-29 DIAGNOSIS — Z9181 History of falling: Secondary | ICD-10-CM | POA: Diagnosis not present

## 2019-10-29 DIAGNOSIS — M5116 Intervertebral disc disorders with radiculopathy, lumbar region: Secondary | ICD-10-CM | POA: Diagnosis not present

## 2019-10-29 DIAGNOSIS — Z96641 Presence of right artificial hip joint: Secondary | ICD-10-CM | POA: Diagnosis not present

## 2019-10-29 DIAGNOSIS — M1990 Unspecified osteoarthritis, unspecified site: Secondary | ICD-10-CM | POA: Diagnosis not present

## 2019-10-29 DIAGNOSIS — Z96659 Presence of unspecified artificial knee joint: Secondary | ICD-10-CM | POA: Diagnosis not present

## 2019-11-03 DIAGNOSIS — M5116 Intervertebral disc disorders with radiculopathy, lumbar region: Secondary | ICD-10-CM | POA: Diagnosis not present

## 2019-11-03 DIAGNOSIS — M1990 Unspecified osteoarthritis, unspecified site: Secondary | ICD-10-CM | POA: Diagnosis not present

## 2019-11-03 DIAGNOSIS — M25551 Pain in right hip: Secondary | ICD-10-CM | POA: Diagnosis not present

## 2019-11-03 DIAGNOSIS — Z79891 Long term (current) use of opiate analgesic: Secondary | ICD-10-CM | POA: Diagnosis not present

## 2019-11-03 DIAGNOSIS — Z96641 Presence of right artificial hip joint: Secondary | ICD-10-CM | POA: Diagnosis not present

## 2019-11-03 DIAGNOSIS — Z9181 History of falling: Secondary | ICD-10-CM | POA: Diagnosis not present

## 2019-11-03 DIAGNOSIS — Z96659 Presence of unspecified artificial knee joint: Secondary | ICD-10-CM | POA: Diagnosis not present

## 2019-11-04 ENCOUNTER — Encounter (INDEPENDENT_AMBULATORY_CARE_PROVIDER_SITE_OTHER): Payer: Medicare HMO | Admitting: Ophthalmology

## 2019-11-04 DIAGNOSIS — C61 Malignant neoplasm of prostate: Secondary | ICD-10-CM | POA: Diagnosis not present

## 2019-11-04 DIAGNOSIS — I10 Essential (primary) hypertension: Secondary | ICD-10-CM | POA: Diagnosis not present

## 2019-11-04 DIAGNOSIS — I5020 Unspecified systolic (congestive) heart failure: Secondary | ICD-10-CM | POA: Diagnosis not present

## 2019-11-04 DIAGNOSIS — H401112 Primary open-angle glaucoma, right eye, moderate stage: Secondary | ICD-10-CM | POA: Diagnosis not present

## 2019-11-04 DIAGNOSIS — Z8546 Personal history of malignant neoplasm of prostate: Secondary | ICD-10-CM | POA: Diagnosis not present

## 2019-11-04 DIAGNOSIS — E7800 Pure hypercholesterolemia, unspecified: Secondary | ICD-10-CM | POA: Diagnosis not present

## 2019-11-04 DIAGNOSIS — N183 Chronic kidney disease, stage 3 unspecified: Secondary | ICD-10-CM | POA: Diagnosis not present

## 2019-11-04 DIAGNOSIS — I4891 Unspecified atrial fibrillation: Secondary | ICD-10-CM | POA: Diagnosis not present

## 2019-11-04 DIAGNOSIS — H401123 Primary open-angle glaucoma, left eye, severe stage: Secondary | ICD-10-CM | POA: Diagnosis not present

## 2019-11-04 DIAGNOSIS — I2510 Atherosclerotic heart disease of native coronary artery without angina pectoris: Secondary | ICD-10-CM | POA: Diagnosis not present

## 2019-11-04 DIAGNOSIS — N400 Enlarged prostate without lower urinary tract symptoms: Secondary | ICD-10-CM | POA: Diagnosis not present

## 2019-11-06 DIAGNOSIS — Z96659 Presence of unspecified artificial knee joint: Secondary | ICD-10-CM | POA: Diagnosis not present

## 2019-11-06 DIAGNOSIS — M5116 Intervertebral disc disorders with radiculopathy, lumbar region: Secondary | ICD-10-CM | POA: Diagnosis not present

## 2019-11-06 DIAGNOSIS — Z96641 Presence of right artificial hip joint: Secondary | ICD-10-CM | POA: Diagnosis not present

## 2019-11-06 DIAGNOSIS — Z9181 History of falling: Secondary | ICD-10-CM | POA: Diagnosis not present

## 2019-11-06 DIAGNOSIS — M1990 Unspecified osteoarthritis, unspecified site: Secondary | ICD-10-CM | POA: Diagnosis not present

## 2019-11-06 DIAGNOSIS — Z79891 Long term (current) use of opiate analgesic: Secondary | ICD-10-CM | POA: Diagnosis not present

## 2019-11-06 DIAGNOSIS — M25551 Pain in right hip: Secondary | ICD-10-CM | POA: Diagnosis not present

## 2019-11-10 DIAGNOSIS — M5116 Intervertebral disc disorders with radiculopathy, lumbar region: Secondary | ICD-10-CM | POA: Diagnosis not present

## 2019-11-10 DIAGNOSIS — Z96659 Presence of unspecified artificial knee joint: Secondary | ICD-10-CM | POA: Diagnosis not present

## 2019-11-10 DIAGNOSIS — Z79891 Long term (current) use of opiate analgesic: Secondary | ICD-10-CM | POA: Diagnosis not present

## 2019-11-10 DIAGNOSIS — M1990 Unspecified osteoarthritis, unspecified site: Secondary | ICD-10-CM | POA: Diagnosis not present

## 2019-11-10 DIAGNOSIS — M25551 Pain in right hip: Secondary | ICD-10-CM | POA: Diagnosis not present

## 2019-11-10 DIAGNOSIS — Z9181 History of falling: Secondary | ICD-10-CM | POA: Diagnosis not present

## 2019-11-10 DIAGNOSIS — Z96641 Presence of right artificial hip joint: Secondary | ICD-10-CM | POA: Diagnosis not present

## 2019-11-12 DIAGNOSIS — M1990 Unspecified osteoarthritis, unspecified site: Secondary | ICD-10-CM | POA: Diagnosis not present

## 2019-11-12 DIAGNOSIS — Z79891 Long term (current) use of opiate analgesic: Secondary | ICD-10-CM | POA: Diagnosis not present

## 2019-11-12 DIAGNOSIS — M25551 Pain in right hip: Secondary | ICD-10-CM | POA: Diagnosis not present

## 2019-11-12 DIAGNOSIS — Z9181 History of falling: Secondary | ICD-10-CM | POA: Diagnosis not present

## 2019-11-12 DIAGNOSIS — Z96641 Presence of right artificial hip joint: Secondary | ICD-10-CM | POA: Diagnosis not present

## 2019-11-12 DIAGNOSIS — M5116 Intervertebral disc disorders with radiculopathy, lumbar region: Secondary | ICD-10-CM | POA: Diagnosis not present

## 2019-11-12 DIAGNOSIS — Z96659 Presence of unspecified artificial knee joint: Secondary | ICD-10-CM | POA: Diagnosis not present

## 2019-11-19 DIAGNOSIS — M1990 Unspecified osteoarthritis, unspecified site: Secondary | ICD-10-CM | POA: Diagnosis not present

## 2019-11-19 DIAGNOSIS — Z96641 Presence of right artificial hip joint: Secondary | ICD-10-CM | POA: Diagnosis not present

## 2019-11-19 DIAGNOSIS — Z79891 Long term (current) use of opiate analgesic: Secondary | ICD-10-CM | POA: Diagnosis not present

## 2019-11-19 DIAGNOSIS — Z9181 History of falling: Secondary | ICD-10-CM | POA: Diagnosis not present

## 2019-11-19 DIAGNOSIS — M5116 Intervertebral disc disorders with radiculopathy, lumbar region: Secondary | ICD-10-CM | POA: Diagnosis not present

## 2019-11-19 DIAGNOSIS — M25551 Pain in right hip: Secondary | ICD-10-CM | POA: Diagnosis not present

## 2019-11-19 DIAGNOSIS — Z96659 Presence of unspecified artificial knee joint: Secondary | ICD-10-CM | POA: Diagnosis not present

## 2019-11-24 DIAGNOSIS — Z9181 History of falling: Secondary | ICD-10-CM | POA: Diagnosis not present

## 2019-11-24 DIAGNOSIS — M5116 Intervertebral disc disorders with radiculopathy, lumbar region: Secondary | ICD-10-CM | POA: Diagnosis not present

## 2019-11-24 DIAGNOSIS — M25551 Pain in right hip: Secondary | ICD-10-CM | POA: Diagnosis not present

## 2019-11-24 DIAGNOSIS — Z79891 Long term (current) use of opiate analgesic: Secondary | ICD-10-CM | POA: Diagnosis not present

## 2019-11-24 DIAGNOSIS — Z96641 Presence of right artificial hip joint: Secondary | ICD-10-CM | POA: Diagnosis not present

## 2019-11-24 DIAGNOSIS — M1990 Unspecified osteoarthritis, unspecified site: Secondary | ICD-10-CM | POA: Diagnosis not present

## 2019-11-24 DIAGNOSIS — Z96659 Presence of unspecified artificial knee joint: Secondary | ICD-10-CM | POA: Diagnosis not present

## 2019-11-24 NOTE — Progress Notes (Signed)
No ICM remote transmission received for 11/16/2019 and next ICM transmission scheduled for 12/14/2019.

## 2019-11-25 DIAGNOSIS — H401123 Primary open-angle glaucoma, left eye, severe stage: Secondary | ICD-10-CM | POA: Diagnosis not present

## 2019-11-25 DIAGNOSIS — Z961 Presence of intraocular lens: Secondary | ICD-10-CM | POA: Diagnosis not present

## 2019-11-25 DIAGNOSIS — H532 Diplopia: Secondary | ICD-10-CM | POA: Diagnosis not present

## 2019-11-25 DIAGNOSIS — H401112 Primary open-angle glaucoma, right eye, moderate stage: Secondary | ICD-10-CM | POA: Diagnosis not present

## 2019-11-25 DIAGNOSIS — Z9889 Other specified postprocedural states: Secondary | ICD-10-CM | POA: Diagnosis not present

## 2019-11-26 ENCOUNTER — Encounter (INDEPENDENT_AMBULATORY_CARE_PROVIDER_SITE_OTHER): Payer: Medicare HMO | Admitting: Ophthalmology

## 2019-11-26 ENCOUNTER — Other Ambulatory Visit: Payer: Self-pay

## 2019-11-26 DIAGNOSIS — Z96641 Presence of right artificial hip joint: Secondary | ICD-10-CM | POA: Diagnosis not present

## 2019-11-26 DIAGNOSIS — H35033 Hypertensive retinopathy, bilateral: Secondary | ICD-10-CM

## 2019-11-26 DIAGNOSIS — Z79891 Long term (current) use of opiate analgesic: Secondary | ICD-10-CM | POA: Diagnosis not present

## 2019-11-26 DIAGNOSIS — H353221 Exudative age-related macular degeneration, left eye, with active choroidal neovascularization: Secondary | ICD-10-CM

## 2019-11-26 DIAGNOSIS — H43811 Vitreous degeneration, right eye: Secondary | ICD-10-CM | POA: Diagnosis not present

## 2019-11-26 DIAGNOSIS — M1990 Unspecified osteoarthritis, unspecified site: Secondary | ICD-10-CM | POA: Diagnosis not present

## 2019-11-26 DIAGNOSIS — I10 Essential (primary) hypertension: Secondary | ICD-10-CM | POA: Diagnosis not present

## 2019-11-26 DIAGNOSIS — H353112 Nonexudative age-related macular degeneration, right eye, intermediate dry stage: Secondary | ICD-10-CM

## 2019-11-26 DIAGNOSIS — Z9181 History of falling: Secondary | ICD-10-CM | POA: Diagnosis not present

## 2019-11-26 DIAGNOSIS — M25551 Pain in right hip: Secondary | ICD-10-CM | POA: Diagnosis not present

## 2019-11-26 DIAGNOSIS — M5116 Intervertebral disc disorders with radiculopathy, lumbar region: Secondary | ICD-10-CM | POA: Diagnosis not present

## 2019-11-26 DIAGNOSIS — Z96659 Presence of unspecified artificial knee joint: Secondary | ICD-10-CM | POA: Diagnosis not present

## 2019-11-27 ENCOUNTER — Encounter (INDEPENDENT_AMBULATORY_CARE_PROVIDER_SITE_OTHER): Payer: Medicare HMO | Admitting: Ophthalmology

## 2019-12-02 DIAGNOSIS — M25551 Pain in right hip: Secondary | ICD-10-CM | POA: Diagnosis not present

## 2019-12-02 DIAGNOSIS — M1990 Unspecified osteoarthritis, unspecified site: Secondary | ICD-10-CM | POA: Diagnosis not present

## 2019-12-02 DIAGNOSIS — Z79891 Long term (current) use of opiate analgesic: Secondary | ICD-10-CM | POA: Diagnosis not present

## 2019-12-02 DIAGNOSIS — Z96659 Presence of unspecified artificial knee joint: Secondary | ICD-10-CM | POA: Diagnosis not present

## 2019-12-02 DIAGNOSIS — Z9181 History of falling: Secondary | ICD-10-CM | POA: Diagnosis not present

## 2019-12-02 DIAGNOSIS — Z96641 Presence of right artificial hip joint: Secondary | ICD-10-CM | POA: Diagnosis not present

## 2019-12-02 DIAGNOSIS — M5116 Intervertebral disc disorders with radiculopathy, lumbar region: Secondary | ICD-10-CM | POA: Diagnosis not present

## 2019-12-03 DIAGNOSIS — M25551 Pain in right hip: Secondary | ICD-10-CM | POA: Diagnosis not present

## 2019-12-03 DIAGNOSIS — Z79891 Long term (current) use of opiate analgesic: Secondary | ICD-10-CM | POA: Diagnosis not present

## 2019-12-03 DIAGNOSIS — Z96641 Presence of right artificial hip joint: Secondary | ICD-10-CM | POA: Diagnosis not present

## 2019-12-03 DIAGNOSIS — Z96659 Presence of unspecified artificial knee joint: Secondary | ICD-10-CM | POA: Diagnosis not present

## 2019-12-03 DIAGNOSIS — Z9181 History of falling: Secondary | ICD-10-CM | POA: Diagnosis not present

## 2019-12-03 DIAGNOSIS — M5116 Intervertebral disc disorders with radiculopathy, lumbar region: Secondary | ICD-10-CM | POA: Diagnosis not present

## 2019-12-03 DIAGNOSIS — M1990 Unspecified osteoarthritis, unspecified site: Secondary | ICD-10-CM | POA: Diagnosis not present

## 2019-12-04 ENCOUNTER — Encounter (INDEPENDENT_AMBULATORY_CARE_PROVIDER_SITE_OTHER): Payer: Self-pay | Admitting: Ophthalmology

## 2019-12-04 ENCOUNTER — Other Ambulatory Visit: Payer: Self-pay

## 2019-12-04 ENCOUNTER — Ambulatory Visit (INDEPENDENT_AMBULATORY_CARE_PROVIDER_SITE_OTHER): Payer: Medicare HMO | Admitting: Ophthalmology

## 2019-12-04 DIAGNOSIS — H35033 Hypertensive retinopathy, bilateral: Secondary | ICD-10-CM | POA: Diagnosis not present

## 2019-12-04 DIAGNOSIS — H353221 Exudative age-related macular degeneration, left eye, with active choroidal neovascularization: Secondary | ICD-10-CM

## 2019-12-04 DIAGNOSIS — H3581 Retinal edema: Secondary | ICD-10-CM

## 2019-12-04 DIAGNOSIS — D23122 Other benign neoplasm of skin of left lower eyelid, including canthus: Secondary | ICD-10-CM | POA: Diagnosis not present

## 2019-12-04 DIAGNOSIS — Z961 Presence of intraocular lens: Secondary | ICD-10-CM | POA: Diagnosis not present

## 2019-12-04 DIAGNOSIS — H353112 Nonexudative age-related macular degeneration, right eye, intermediate dry stage: Secondary | ICD-10-CM | POA: Diagnosis not present

## 2019-12-04 DIAGNOSIS — I10 Essential (primary) hypertension: Secondary | ICD-10-CM

## 2019-12-04 MED ORDER — NEOMYCIN-POLYMYXIN-DEXAMETH 0.1 % OP OINT: 1.0000 "application " | TOPICAL_OINTMENT | Freq: Four times a day (QID) | OPHTHALMIC | 1 refills | Status: AC | PRN

## 2019-12-04 NOTE — Progress Notes (Signed)
[facility] Note  12/04/2019     CHIEF COMPLAINT Patient presents for Retina Evaluation   HISTORY OF PRESENT ILLNESS: [name] is a 86 y.o. male who presents to the clinic today for:   HPI    Retina Evaluation    In left eye.  This started 8 days ago.  Duration of 8 days.  Associated Symptoms Pain.  Negative for Blind Spot, Photophobia, Scalp Tenderness, Fever, Glare, Jaw Claudication, Weight Loss, Distortion, Redness, Trauma, Shoulder/Hip pain, Fatigue, Flashes and Floaters.  Context:  distance vision, mid-range vision and near vision.  Treatments tried include no treatments.  I, the attending physician,  performed the HPI with the patient and updated documentation appropriately.          Comments    Patient states had injection OS with Dr. Zigmund Daniel on 09.30.2021. Since injection, OS feels like trash in the eye. OS waters some. No discharge. Symptoms for the past 8 days. Hasn't tried AT's. Is on latanoprost qhs OU, alphagan bid OU, and cosopt bid OU.       Last edited by Bernarda Caffey, MD on 12/04/2019  1:56 PM. (History)    pt is a Dr. Zigmund Daniel pt who received Avastin OS on 09.30.21, pt states every since then his eye has been painful and feels like it has "trash" in it, pt used the drops Dr. Zigmund Daniel gave him after the injection, he is also using drops for pressure  Referring physician: Warden Fillers, MD [address],  [place] [id number]  HISTORICAL INFORMATION:   Selected notes from the MEDICAL RECORD NUMBER Dr. Ivan Croft pt c/o decreased VA and floaters s/p injection LEE:  Ocular Hx- PMH-    CURRENT MEDICATIONS: Current Outpatient Medications (Ophthalmic Drugs)  Medication Sig  . brimonidine (ALPHAGAN P) 0.1 % SOLN Place 1 drop into both eyes 2 (two) times daily.   . dorzolamide-timolol (COSOPT) 22.3-6.8 MG/ML ophthalmic solution Place 1 drop into both eyes 2 (two) times daily.   . bacitracin-polymyxin b  (POLYSPORIN) ophthalmic ointment Place 1 application into the left eye 3 (three) times daily. apply to eye every 12 hours while awake (Patient not taking: Reported on 12/04/2019)  . gatifloxacin (ZYMAXID) 0.5 % SOLN Place 1 drop into the left eye 4 (four) times daily. (Patient not taking: Reported on 12/04/2019)  . latanoprost (XALATAN) 0.005 % ophthalmic solution Place 1 drop into the left eye at bedtime.  [name] neomycin-polymyxin-dexameth (MAXITROL) 0.1 % OINT Place 1 application into the left eye 4 (four) times daily as needed for up to 10 days.  . prednisoLONE acetate (PRED FORTE) 1 % ophthalmic suspension Place 1 drop into the left eye 4 (four) times daily. (Patient not taking: Reported on 12/04/2019)   No current facility-administered medications for this visit. (Ophthalmic Drugs)   Current Outpatient Medications (Other)  Medication Sig  . buPROPion (WELLBUTRIN XL) 150 MG 24 hr tablet Take 150 mg by mouth daily.  . clopidogrel (PLAVIX) 75 MG tablet TAKE 1 TABLET EVERY DAY (Patient taking differently: Take 75 mg by mouth daily. )  . cyclobenzaprine (FLEXERIL) 5 MG tablet Take 2.5 mg by mouth 3 (three) times daily as needed for muscle spasms.   . diazepam (VALIUM) 5 MG tablet Take 1 tablet (5 mg total) by mouth every 12 (twelve) hours as needed for anxiety.  . Multiple Vitamins-Minerals (MULTIVITAMIN WITH MINERALS) tablet Take 1 tablet by mouth daily.  . nitroGLYCERIN (NITROSTAT) 0.4 MG SL tablet Place  1 tablet (0.4 mg total) under the tongue every 5 (five) minutes as needed for chest pain.  [name] OVER THE COUNTER MEDICATION Take 1 capsule by mouth daily. IBguard  . pantoprazole (PROTONIX) 40 MG tablet Take 1 tablet by mouth daily.  . potassium chloride (K-DUR,KLOR-CON) 10 MEQ tablet TAKE 1 TABLET EVERY DAY AS NEEDED. WHEN YOU TAKE YOUR FUROSEMIDE. (Patient taking differently: Take 10 mEq by mouth daily. )  . Probiotic Product (PROBIOTIC PO) Take 1 tablet by mouth daily. ALIGN  . psyllium (METAMUCIL)  58.6 % packet Take 1 packet by mouth daily with supper. Mix in 8 oz liquid and drink  . tamsulosin (FLOMAX) 0.4 MG CAPS capsule Take 0.4 mg by mouth daily.  [name] torsemide (DEMADEX) 20 MG tablet Take one extra tablet of torsemide by mouth if weight gain of 3 pounds (Patient taking differently: Take 20 mg by mouth daily. Take one extra tablet of torsemide by mouth if weight gain of 3 pounds)  . traMADol (ULTRAM) 50 MG tablet Take 50 mg by mouth every 6 (six) hours as needed for moderate pain.   . traZODone (DESYREL) 50 MG tablet Take 50 mg by mouth at bedtime as needed for sleep.    No current facility-administered medications for this visit. (Other)      REVIEW OF SYSTEMS: ROS    Positive for: Cardiovascular, Eyes   Negative for: Constitutional, Gastrointestinal, Neurological, Skin, Genitourinary, Musculoskeletal, HENT, Endocrine, Respiratory, Psychiatric, Allergic/Imm, Heme/Lymph   Last edited by Roselee Nova D, COT on 12/04/2019  1:27 PM. (History)       ALLERGIES Allergies  Allergen Reactions  . Codeine Anxiety and Other (See Comments)    Insomnia/ hyper  . Omeprazole Nausea And Vomiting and Other (See Comments)    GI upset, insomnia  . Warfarin Sodium Anxiety and Other (See Comments)    Hx GI bleed    PAST MEDICAL HISTORY Past Medical History:  Diagnosis Date  . Anxiety   . Arthritis   . Atrial fibrillation, permanent ([place]) 01/07/2016  . Blood transfusion    "related to a surgery"  . BPH (benign prostatic hypertrophy) with urinary obstruction    s/p turp yrs ago  . Bradycardia    a. Amio d/c'd 08/2013; brady arrest 08/2013 after PCI >>> recurrent AF >>> Amiodarone restarted. b. Pacemaker being considered in 11/2014.  [name] CAD (coronary artery disease)    a. s/p MI and prior PCI of LAD;  b. LHC (08/2013):  prox LAD 60-70%, mid LAD stents ok, ostial lesion at Dx jailed by stent, mild CFX and RCA disesase >>>  PCI (09/08/13):  rotational atherectomy + Promus DES to prox LAD  .  Cardiomyopathy ([place])    a. Echo (08/2013):  EF 30-35%, AS hypokinesis, Gr 1 diast dysfn, mild MR, mild LAE >>> b. improved EF 50-55% by echo 8/15. c. EF down again by echo 12/2014 to 30-35% but 51% by nuc.  [name] Chronic lower back pain   . Chronic systolic CHF (congestive heart failure) ([place])   . CKD (chronic kidney disease), stage III ([place])    a. Per review of labs baseline Cr 1.1-1.3.  [name] DDD (degenerative disc disease)    chronic back pain  . Depression   . Diverticulitis of colon with bleeding    s/p sigmoid resection '88  . DJD (degenerative joint disease) hips and knees   s/p bilateral total replacements  . GERD (gastroesophageal reflux disease)    occ. take prevacid  . Glaucoma   .  Hearing impaired person, right   . History of GI diverticular bleed april 2012   transfused blood and resolved without surgical intervention  . Hypertension   . Impaired hearing bilateral    only left hearing aid  . Impotence, organic    s/p penile prosthesis 1990's  . Incomplete bladder emptying   . LBBB (left bundle branch block)   . Meningioma ([place]) right -sided w/ right VI palsy   followed by dr Gaynell Face  . Mitral regurgitation    a. Mild-mod by echo 12/2014.  [name] Myocardial infarction [facility]) 1980's- medical intervention   "so mild I didn't know I'd had it"  . PAF (paroxysmal atrial fibrillation) ([facility])    not on coumadin due to hx of GI bleed.  . Prostate cancer ([place]) 11/30/13   Gleason 8, volume 22.14 cc  . Sleep apnea    non-compliant cpap  . Uses hearing aid    left   . Vitreous hemorrhage ([facility])    related to branch retinal vein occlusion left eye  . Wears glasses    Past Surgical History:  Procedure Laterality Date  . APPENDECTOMY    . CARDIAC CATHETERIZATION  2007   noncritical cad (results w/ chart)  . CARDIOVERSION N/A 10/13/2015   Procedure: CARDIOVERSION;  Surgeon: Josue Hector, MD;  Location: [place];  Service: Cardiovascular;  Laterality: N/A;  . CATARACT EXTRACTION W/  INTRAOCULAR LENS  IMPLANT, BILATERAL Bilateral ~ 2000  . CHOLECYSTECTOMY    . CLOSED REDUCTION HIP DISLOCATION Right "several"  . CORONARY ANGIOPLASTY WITH STENT PLACEMENT  08-03-08   drug-eluting stent x2 distal and mid lad  . EP IMPLANTABLE DEVICE N/A 01/31/2015   Procedure: BiV Pacemaker Insertion CRT-P;  Surgeon: Evans Lance, MD;  Location: [place] CV LAB;  Service: Cardiovascular;  Laterality: N/A;  . EP IMPLANTABLE DEVICE N/A 02/07/2015   Procedure: Lead Revision/Repair;  Surgeon: Deboraha Sprang, MD;  Location: [place] CV LAB;  Service: Cardiovascular;  Laterality: N/A;  . ESOPHAGOGASTRODUODENOSCOPY N/A 02/11/2014   Procedure: ESOPHAGOGASTRODUODENOSCOPY (EGD);  Surgeon: Cleotis Nipper, MD;  Location: [facility] ENDOSCOPY;  Service: Endoscopy;  Laterality: N/A;  . gamma knife radiation  2000   [facility] for meningioma, last eval 2013- no change  . GAS INSERTION Left 07/14/2019   Procedure: INSERTION OF GAS;  Surgeon: Hayden Pedro, MD;  Location: [place];  Service: Ophthalmology;  Laterality: Left;  . INGUINAL HERNIA REPAIR Bilateral   . INNER EAR SURGERY Right yrs ago   "trying to get my hearing back  . LEFT HEART CATHETERIZATION WITH CORONARY ANGIOGRAM N/A 09/04/2013   Procedure: LEFT HEART CATHETERIZATION WITH CORONARY ANGIOGRAM;  Surgeon: Jettie Booze, MD;  Location: [facility] CATH LAB;  Service: Cardiovascular;  Laterality: N/A;  . PARS PLANA VITRECTOMY Left 07/14/2019   27 GAUGE  . PARS PLANA VITRECTOMY 27 GAUGE Left 07/14/2019   Procedure: PARS PLANA VITRECTOMY 27 GAUGE;  Surgeon: Hayden Pedro, MD;  Location: [place];  Service: Ophthalmology;  Laterality: Left;  . PENILE PROSTHESIS IMPLANT  1990's  . PERCUTANEOUS CORONARY ROTOBLATOR INTERVENTION (PCI-R) N/A 09/08/2013   Procedure: PERCUTANEOUS CORONARY ROTOBLATOR INTERVENTION (PCI-R);  Surgeon: Jettie Booze, MD;  Location: [facility] CATH LAB;  Service: Cardiovascular;  Laterality: N/A;  . PHOTOCOAGULATION WITH LASER Left  07/14/2019   Procedure: PHOTOCOAGULATION WITH LASER;  Surgeon: Hayden Pedro, MD;  Location: [place];  Service: Ophthalmology;  Laterality: Left;  . PROSTATE BIOPSY  11/30/13   Gleason 8, vol 22.14 cc  .  REVISION TOTAL KNEE ARTHROPLASTY Right   . SHOULDER OPEN ROTATOR CUFF REPAIR Left   . TOTAL HIP ARTHROPLASTY Right 03-25-08--  . TOTAL HIP ARTHROPLASTY Left 2005  . TOTAL HIP REVISION Right 3-4 times  . TOTAL KNEE ARTHROPLASTY Right 2004  . TOTAL KNEE ARTHROPLASTY Left 1997  . TRANSURETHRAL RESECTION OF PROSTATE  "years ago"  . TRANSURETHRAL RESECTION OF PROSTATE  01/08/2011   Procedure: TRANSURETHRAL RESECTION OF THE PROSTATE (TURP);  Surgeon: Franchot Gallo;  Location: [place];  Service: Urology;  Laterality: N/A;  GYRUS     FAMILY HISTORY Family History  Problem Relation Age of Onset  . Heart disease Mother   . Heart attack Mother   . Emphysema Father   . Cancer Brother        liver cancer  . Cancer Other     SOCIAL HISTORY Social History   Tobacco Use  . Smoking status: Former Smoker    Packs/day: 1.00    Years: 14.00    Pack years: 14.00    Types: Cigarettes    Quit date: 01/05/1959    Years since quitting: 60.9  . Smokeless tobacco: Never Used  Vaping Use  . Vaping Use: Never used  Substance Use Topics  . Alcohol use: Yes    Comment: social   . Drug use: No         OPHTHALMIC EXAM:  Base Eye Exam    Visual Acuity (Snellen - Linear)      Right Left   Dist cc 20/30 -1 20/50 -1   Dist ph cc NI 20/40 -2   Correction: Glasses       Tonometry (Tonopen, 1:44 PM)      Right Left   Pressure 14 10       Visual Fields (Counting fingers)      Left Right     Full   Restrictions Partial outer inferior temporal, superior nasal, inferior nasal deficiencies        Extraocular Movement      Right Left    Full, Ortho Full, Ortho       Neuro/Psych    Oriented x3: Yes   Mood/Affect: Normal       Dilation    Both eyes: 1.0%  Mydriacyl, 2.5% Phenylephrine @ 1:44 PM        Slit Lamp and Fundus Exam    Slit Lamp Exam      Right Left   Lids/Lashes Dermatochalasis - upper lid, mild Meibomian gland dysfunction Dermatochalasis - upper lid, Meibomian gland dysfunction, papilloma inner inferior margin   Conjunctiva/Sclera White and quiet White and quiet   Cornea arcus, well healed temporal cataract wounds, 2+ Punctate epithelial erosions arcus, well healed temporal cataract wounds, 2+ Punctate epithelial erosions   Anterior Chamber Deep and quiet Deep and quiet   Iris Round and moderately dilated Round and moderately dilated   Lens PC IOL in good position with open PC PC IOL in good position with open PC   Vitreous Vitreous syneresis Vitreous syneresis       Fundus Exam      Right Left   Disc mild Pallor, +cupping Pallor, +cupping, very thin inferior rim   C/D Ratio 0.7 0.85   Macula Flat, Blunted foveal reflex, Drusen, RPE mottling and clumping, No heme or edema Blunted foveal reflex, RPE mottling, clumping and atrophy, +Cystic changes, segmental laser scars ST macula   Vessels attenuated, mild tortuousity attenuated, mild tortuousity   Periphery Attached  Attached, scattered peripheral laser scars        Refraction    Wearing Rx      Sphere Cylinder Axis Add   Right -1.25 +2.00 180 +3.00   Left -1.25 +1.50 177 +3.00       Manifest Refraction      Sphere Cylinder Axis Dist VA   Right -2.00 +2.25 175 20/30+1   Left -1.25 +1.50 175 20/50-2          IMAGING AND PROCEDURES  Imaging and Procedures for 12/04/2019  OCT, Retina - OU - Both Eyes       Right Eye Quality was good. Central Foveal Thickness: 247. Progression has been stable. Findings include normal foveal contour, no IRF, no SRF, retinal drusen  (Blunted foveal contour, mild diffuse retinal thinning / atrophy).   Left Eye Quality was good. Central Foveal Thickness: 271. Progression has been stable. Findings include abnormal foveal  contour, retinal drusen , intraretinal fluid, intraretinal hyper-reflective material, outer retinal atrophy, epiretinal membrane (Persistent cystic changes ST macula; focal ORA ST mac).   Notes *Images captured and stored on drive  Diagnosis / Impression:  OD: Blunted foveal contour, mild diffuse retinal thinning / atrophy OS: Persistent cystic changes ST macula; focal ORA ST mac  Clinical management:  See below  Abbreviations: NFP - Normal foveal profile. CME - cystoid macular edema. PED - pigment epithelial detachment. IRF - intraretinal fluid. SRF - subretinal fluid. EZ - ellipsoid zone. ERM - epiretinal membrane. ORA - outer retinal atrophy. ORT - outer retinal tubulation. SRHM - subretinal hyper-reflective material. IRHM - intraretinal hyper-reflective material                 ASSESSMENT/PLAN:    ICD-10-CM   1. Papilloma of left lower eyelid  D23.122   2. Exudative age-related macular degeneration of left eye with active choroidal neovascularization ([place])  H35.3221   3. Retinal edema  H35.81 OCT, Retina - OU - Both Eyes  4. Intermediate stage nonexudative age-related macular degeneration of right eye  H35.3112   5. Essential hypertension  I10   6. Hypertensive retinopathy of both eyes  H35.033   7. Pseudophakia, both eyes  Z96.1    1. Marginal Papilloma OS  - JDM pt presents acutely today with 8d history of foreign body sensation OS, since IVA OS w/ JDM on 9.30.21  - small papilloma on inner lower lid margin appears to be offending lesion  - no significant keratopathy  - recommend Maxitrol Ung QID, prn OS for symptom relief  - will refer back to Dr. Midge Aver for evaluation and management of marginal papilloma  - f/u w/ JDM as scheduled  2,3. Exudative age related macular degeneration, OS  - The incidence pathology and anatomy of wet AMD discussed   - discussed treatment options including observation vs intravitreal anti-VEGF agents such as Avastin, Lucentis, Eylea.     - Risks of endophthalmitis and vascular occlusive events and atrophic changes discussed with patient  - f/u with Dr. Zigmund Daniel as scheduled  4. Age related macular degeneration, non-exudative, OD  - The incidence, anatomy, and pathology of dry AMD, risk of progression, and the AREDS and AREDS 2 studies including smoking risks discussed with patient.  - Recommend amsler grid monitoring  5,6. Hypertensive retinopathy OU - discussed importance of tight BP control - monitor  7. Pseudophakia OU  - s/p CE/IOL  - IOLs in good position, doing well  - monitor   Ophthalmic Meds Ordered this visit:  Meds ordered this encounter  Medications  . neomycin-polymyxin-dexameth (MAXITROL) 0.1 % OINT    Sig: Place 1 application into the left eye 4 (four) times daily as needed for up to 10 days.    Dispense:  3.5 g    Refill:  1       Return for f/u as scheduled with Dr. Zigmund Daniel.  There are no Patient Instructions on file for this visit.   Explained the diagnoses, plan, and follow up with the patient and they expressed understanding.  Patient expressed understanding of the importance of proper follow up care.   This document serves as a record of services personally performed by Gardiner Sleeper, MD, PhD. It was created on their behalf by San Jetty. Owens Shark, OA an ophthalmic technician. The creation of this record is the provider's dictation and/or activities during the visit.    Electronically signed by: San Jetty. Owens Shark, [place] 10.08.2021 4:50 PM   Gardiner Sleeper, M.D., Ph.D. Diseases & Surgery of the Retina and Vitreous [facility]  I have reviewed the above documentation for accuracy and completeness, and I agree with the above. Gardiner Sleeper, M.D., Ph.D. 12/04/19 4:50 PM   Abbreviations: M myopia (nearsighted); A astigmatism; H hyperopia (farsighted); P presbyopia; Mrx spectacle prescription;  CTL contact lenses; OD right eye; OS left eye; OU both eyes  XT  exotropia; ET esotropia; PEK punctate epithelial keratitis; PEE punctate epithelial erosions; DES dry eye syndrome; MGD meibomian gland dysfunction; ATs artificial tears; PFAT's preservative free artificial tears; [place] nuclear sclerotic cataract; PSC posterior subcapsular cataract; ERM epi-retinal membrane; PVD posterior vitreous detachment; RD retinal detachment; DM diabetes mellitus; DR diabetic retinopathy; NPDR non-proliferative diabetic retinopathy; PDR proliferative diabetic retinopathy; CSME clinically significant macular edema; DME diabetic macular edema; dbh dot blot hemorrhages; CWS cotton wool spot; POAG primary open angle glaucoma; C/D cup-to-disc ratio; HVF humphrey visual field; GVF goldmann visual field; OCT optical coherence tomography; IOP intraocular pressure; BRVO Branch retinal vein occlusion; CRVO central retinal vein occlusion; CRAO central retinal artery occlusion; BRAO branch retinal artery occlusion; RT retinal tear; SB scleral buckle; PPV pars plana vitrectomy; VH Vitreous hemorrhage; PRP panretinal laser photocoagulation; IVK intravitreal kenalog; VMT vitreomacular traction; MH Macular hole;  NVD neovascularization of the disc; NVE neovascularization elsewhere; AREDS age related eye disease study; ARMD age related macular degeneration; POAG primary open angle glaucoma; EBMD epithelial/anterior basement membrane dystrophy; ACIOL anterior chamber intraocular lens; IOL intraocular lens; PCIOL posterior chamber intraocular lens; Phaco/IOL phacoemulsification with intraocular lens placement; [place] photorefractive keratectomy; LASIK laser assisted in situ keratomileusis; HTN hypertension; DM diabetes mellitus; COPD chronic obstructive pulmonary disease

## 2019-12-07 DIAGNOSIS — Z79891 Long term (current) use of opiate analgesic: Secondary | ICD-10-CM | POA: Diagnosis not present

## 2019-12-07 DIAGNOSIS — Z96641 Presence of right artificial hip joint: Secondary | ICD-10-CM | POA: Diagnosis not present

## 2019-12-07 DIAGNOSIS — M25551 Pain in right hip: Secondary | ICD-10-CM | POA: Diagnosis not present

## 2019-12-07 DIAGNOSIS — Z96659 Presence of unspecified artificial knee joint: Secondary | ICD-10-CM | POA: Diagnosis not present

## 2019-12-07 DIAGNOSIS — M5116 Intervertebral disc disorders with radiculopathy, lumbar region: Secondary | ICD-10-CM | POA: Diagnosis not present

## 2019-12-07 DIAGNOSIS — M1990 Unspecified osteoarthritis, unspecified site: Secondary | ICD-10-CM | POA: Diagnosis not present

## 2019-12-07 DIAGNOSIS — Z9181 History of falling: Secondary | ICD-10-CM | POA: Diagnosis not present

## 2019-12-09 DIAGNOSIS — M1990 Unspecified osteoarthritis, unspecified site: Secondary | ICD-10-CM | POA: Diagnosis not present

## 2019-12-09 DIAGNOSIS — M25551 Pain in right hip: Secondary | ICD-10-CM | POA: Diagnosis not present

## 2019-12-09 DIAGNOSIS — Z79891 Long term (current) use of opiate analgesic: Secondary | ICD-10-CM | POA: Diagnosis not present

## 2019-12-09 DIAGNOSIS — Z9181 History of falling: Secondary | ICD-10-CM | POA: Diagnosis not present

## 2019-12-09 DIAGNOSIS — Z96659 Presence of unspecified artificial knee joint: Secondary | ICD-10-CM | POA: Diagnosis not present

## 2019-12-09 DIAGNOSIS — Z96641 Presence of right artificial hip joint: Secondary | ICD-10-CM | POA: Diagnosis not present

## 2019-12-09 DIAGNOSIS — M5116 Intervertebral disc disorders with radiculopathy, lumbar region: Secondary | ICD-10-CM | POA: Diagnosis not present

## 2019-12-10 DIAGNOSIS — M5416 Radiculopathy, lumbar region: Secondary | ICD-10-CM | POA: Diagnosis not present

## 2019-12-14 ENCOUNTER — Ambulatory Visit (INDEPENDENT_AMBULATORY_CARE_PROVIDER_SITE_OTHER): Payer: Medicare HMO

## 2019-12-14 DIAGNOSIS — I5022 Chronic systolic (congestive) heart failure: Secondary | ICD-10-CM | POA: Diagnosis not present

## 2019-12-14 DIAGNOSIS — Z950 Presence of cardiac pacemaker: Secondary | ICD-10-CM | POA: Diagnosis not present

## 2019-12-16 DIAGNOSIS — R197 Diarrhea, unspecified: Secondary | ICD-10-CM | POA: Diagnosis not present

## 2019-12-17 ENCOUNTER — Telehealth: Payer: Self-pay

## 2019-12-17 NOTE — Telephone Encounter (Signed)
I called the patient because he missed his 12/14/2019 remote appointment for [name]. The pt transmission received.

## 2019-12-18 ENCOUNTER — Telehealth: Payer: Self-pay

## 2019-12-18 DIAGNOSIS — R1013 Epigastric pain: Secondary | ICD-10-CM | POA: Diagnosis not present

## 2019-12-18 DIAGNOSIS — R197 Diarrhea, unspecified: Secondary | ICD-10-CM | POA: Diagnosis not present

## 2019-12-18 NOTE — Telephone Encounter (Signed)
Remote ICM transmission received.  Attempted call to patient regarding ICM remote transmission and left message to return call   

## 2019-12-18 NOTE — Progress Notes (Signed)
EPIC Encounter for ICM Monitoring  Patient Name: [name] is a 86 y.o. male Date: 12/18/2019 Primary Care Physican: Lawerance Cruel, MD Primary Cardiologist:Varanasi  Electrophysiologist:Taylor LastWeight:180 lbs   Attempted call to patient and unable to reach.  Left message to return call. Transmission reviewed.   CorVuethoracic impedancesuggesting normal fluid levels.  Prescribed: Torsemide 20 mgTake 20-40 mg by mouth daily. Take one extra tablet of torsemide by mouth if weight gain of 3 pounds Potassium 10 mEqTake 10 mEq by mouth daily.  Labs: 05/18/2019 Creatinine 1.32, BUN 19, Potassium 3.8, Sodium 136, GFR 47-55 05/17/2019 Creatinine 1.07, BUN 16, Potassium 3.7, Sodium 136, GFR >60 05/16/2019 Creatinine 1.23, BUN 16, Potassium 3.9, Sodium 138, GFR 51-60 A complete set of results can be found in Results Review..  Recommendations: Unable to reach.    Follow-up plan: ICM clinic phone appointment on11/22/2021. 91 day device clinic remote transmission 02/18/2020.   EP/Cardiology Office Visits:Recalls 10/03/2019 with Dr.Varanasi and 05/02/2020 with DrTaylor.   Copy of ICM check sent to Dr.Taylor.  3 month ICM trend: 12/17/2019    1 Year ICM trend:       Rosalene Billings, RN 12/18/2019 9:01 AM

## 2019-12-21 DIAGNOSIS — R197 Diarrhea, unspecified: Secondary | ICD-10-CM | POA: Diagnosis not present

## 2019-12-23 DIAGNOSIS — M5416 Radiculopathy, lumbar region: Secondary | ICD-10-CM | POA: Diagnosis not present

## 2019-12-23 DIAGNOSIS — Z6827 Body mass index (BMI) 27.0-27.9, adult: Secondary | ICD-10-CM | POA: Diagnosis not present

## 2019-12-23 DIAGNOSIS — G894 Chronic pain syndrome: Secondary | ICD-10-CM | POA: Diagnosis not present

## 2019-12-23 DIAGNOSIS — M19022 Primary osteoarthritis, left elbow: Secondary | ICD-10-CM | POA: Diagnosis not present

## 2019-12-24 ENCOUNTER — Other Ambulatory Visit: Payer: Self-pay

## 2019-12-24 ENCOUNTER — Encounter (INDEPENDENT_AMBULATORY_CARE_PROVIDER_SITE_OTHER): Payer: Medicare HMO | Admitting: Ophthalmology

## 2019-12-24 DIAGNOSIS — I10 Essential (primary) hypertension: Secondary | ICD-10-CM | POA: Diagnosis not present

## 2019-12-24 DIAGNOSIS — H35033 Hypertensive retinopathy, bilateral: Secondary | ICD-10-CM | POA: Diagnosis not present

## 2019-12-24 DIAGNOSIS — H43811 Vitreous degeneration, right eye: Secondary | ICD-10-CM

## 2019-12-24 DIAGNOSIS — H353221 Exudative age-related macular degeneration, left eye, with active choroidal neovascularization: Secondary | ICD-10-CM

## 2019-12-24 DIAGNOSIS — H353112 Nonexudative age-related macular degeneration, right eye, intermediate dry stage: Secondary | ICD-10-CM | POA: Diagnosis not present

## 2019-12-25 DIAGNOSIS — Z96641 Presence of right artificial hip joint: Secondary | ICD-10-CM | POA: Diagnosis not present

## 2019-12-25 DIAGNOSIS — Z96659 Presence of unspecified artificial knee joint: Secondary | ICD-10-CM | POA: Diagnosis not present

## 2019-12-25 DIAGNOSIS — M5116 Intervertebral disc disorders with radiculopathy, lumbar region: Secondary | ICD-10-CM | POA: Diagnosis not present

## 2019-12-25 DIAGNOSIS — Z9181 History of falling: Secondary | ICD-10-CM | POA: Diagnosis not present

## 2019-12-25 DIAGNOSIS — M25551 Pain in right hip: Secondary | ICD-10-CM | POA: Diagnosis not present

## 2019-12-25 DIAGNOSIS — M1990 Unspecified osteoarthritis, unspecified site: Secondary | ICD-10-CM | POA: Diagnosis not present

## 2019-12-25 DIAGNOSIS — Z79891 Long term (current) use of opiate analgesic: Secondary | ICD-10-CM | POA: Diagnosis not present

## 2019-12-31 DIAGNOSIS — Z9181 History of falling: Secondary | ICD-10-CM | POA: Diagnosis not present

## 2019-12-31 DIAGNOSIS — H9193 Unspecified hearing loss, bilateral: Secondary | ICD-10-CM | POA: Diagnosis not present

## 2019-12-31 DIAGNOSIS — Z96659 Presence of unspecified artificial knee joint: Secondary | ICD-10-CM | POA: Diagnosis not present

## 2019-12-31 DIAGNOSIS — M5116 Intervertebral disc disorders with radiculopathy, lumbar region: Secondary | ICD-10-CM | POA: Diagnosis not present

## 2019-12-31 DIAGNOSIS — I10 Essential (primary) hypertension: Secondary | ICD-10-CM | POA: Diagnosis not present

## 2019-12-31 DIAGNOSIS — Z96641 Presence of right artificial hip joint: Secondary | ICD-10-CM | POA: Diagnosis not present

## 2019-12-31 DIAGNOSIS — M1990 Unspecified osteoarthritis, unspecified site: Secondary | ICD-10-CM | POA: Diagnosis not present

## 2019-12-31 DIAGNOSIS — Z7902 Long term (current) use of antithrombotics/antiplatelets: Secondary | ICD-10-CM | POA: Diagnosis not present

## 2020-01-06 DIAGNOSIS — C61 Malignant neoplasm of prostate: Secondary | ICD-10-CM | POA: Diagnosis not present

## 2020-01-06 DIAGNOSIS — M1990 Unspecified osteoarthritis, unspecified site: Secondary | ICD-10-CM | POA: Diagnosis not present

## 2020-01-06 DIAGNOSIS — Z96659 Presence of unspecified artificial knee joint: Secondary | ICD-10-CM | POA: Diagnosis not present

## 2020-01-06 DIAGNOSIS — Z9181 History of falling: Secondary | ICD-10-CM | POA: Diagnosis not present

## 2020-01-06 DIAGNOSIS — Z7902 Long term (current) use of antithrombotics/antiplatelets: Secondary | ICD-10-CM | POA: Diagnosis not present

## 2020-01-06 DIAGNOSIS — I10 Essential (primary) hypertension: Secondary | ICD-10-CM | POA: Diagnosis not present

## 2020-01-06 DIAGNOSIS — N35919 Unspecified urethral stricture, male, unspecified site: Secondary | ICD-10-CM | POA: Diagnosis not present

## 2020-01-06 DIAGNOSIS — M5116 Intervertebral disc disorders with radiculopathy, lumbar region: Secondary | ICD-10-CM | POA: Diagnosis not present

## 2020-01-06 DIAGNOSIS — H9193 Unspecified hearing loss, bilateral: Secondary | ICD-10-CM | POA: Diagnosis not present

## 2020-01-06 DIAGNOSIS — Z96641 Presence of right artificial hip joint: Secondary | ICD-10-CM | POA: Diagnosis not present

## 2020-01-11 DIAGNOSIS — R269 Unspecified abnormalities of gait and mobility: Secondary | ICD-10-CM | POA: Diagnosis not present

## 2020-01-11 DIAGNOSIS — M1990 Unspecified osteoarthritis, unspecified site: Secondary | ICD-10-CM | POA: Diagnosis not present

## 2020-01-11 DIAGNOSIS — R296 Repeated falls: Secondary | ICD-10-CM | POA: Diagnosis not present

## 2020-01-11 DIAGNOSIS — M5416 Radiculopathy, lumbar region: Secondary | ICD-10-CM | POA: Diagnosis not present

## 2020-01-11 DIAGNOSIS — F419 Anxiety disorder, unspecified: Secondary | ICD-10-CM | POA: Diagnosis not present

## 2020-01-13 DIAGNOSIS — H9193 Unspecified hearing loss, bilateral: Secondary | ICD-10-CM | POA: Diagnosis not present

## 2020-01-13 DIAGNOSIS — Z96641 Presence of right artificial hip joint: Secondary | ICD-10-CM | POA: Diagnosis not present

## 2020-01-13 DIAGNOSIS — I10 Essential (primary) hypertension: Secondary | ICD-10-CM | POA: Diagnosis not present

## 2020-01-13 DIAGNOSIS — M1990 Unspecified osteoarthritis, unspecified site: Secondary | ICD-10-CM | POA: Diagnosis not present

## 2020-01-13 DIAGNOSIS — Z96659 Presence of unspecified artificial knee joint: Secondary | ICD-10-CM | POA: Diagnosis not present

## 2020-01-13 DIAGNOSIS — Z7902 Long term (current) use of antithrombotics/antiplatelets: Secondary | ICD-10-CM | POA: Diagnosis not present

## 2020-01-13 DIAGNOSIS — Z9181 History of falling: Secondary | ICD-10-CM | POA: Diagnosis not present

## 2020-01-13 DIAGNOSIS — M5116 Intervertebral disc disorders with radiculopathy, lumbar region: Secondary | ICD-10-CM | POA: Diagnosis not present

## 2020-01-18 DIAGNOSIS — N183 Chronic kidney disease, stage 3 unspecified: Secondary | ICD-10-CM | POA: Diagnosis not present

## 2020-01-18 DIAGNOSIS — K219 Gastro-esophageal reflux disease without esophagitis: Secondary | ICD-10-CM | POA: Diagnosis not present

## 2020-01-18 DIAGNOSIS — R296 Repeated falls: Secondary | ICD-10-CM | POA: Diagnosis not present

## 2020-01-18 DIAGNOSIS — Z0000 Encounter for general adult medical examination without abnormal findings: Secondary | ICD-10-CM | POA: Diagnosis not present

## 2020-01-18 DIAGNOSIS — I10 Essential (primary) hypertension: Secondary | ICD-10-CM | POA: Diagnosis not present

## 2020-01-18 DIAGNOSIS — E7800 Pure hypercholesterolemia, unspecified: Secondary | ICD-10-CM | POA: Diagnosis not present

## 2020-01-18 DIAGNOSIS — Z23 Encounter for immunization: Secondary | ICD-10-CM | POA: Diagnosis not present

## 2020-01-18 DIAGNOSIS — F419 Anxiety disorder, unspecified: Secondary | ICD-10-CM | POA: Diagnosis not present

## 2020-01-18 DIAGNOSIS — M5416 Radiculopathy, lumbar region: Secondary | ICD-10-CM | POA: Diagnosis not present

## 2020-01-18 DIAGNOSIS — I4891 Unspecified atrial fibrillation: Secondary | ICD-10-CM | POA: Diagnosis not present

## 2020-01-18 DIAGNOSIS — G479 Sleep disorder, unspecified: Secondary | ICD-10-CM | POA: Diagnosis not present

## 2020-01-18 DIAGNOSIS — F332 Major depressive disorder, recurrent severe without psychotic features: Secondary | ICD-10-CM | POA: Diagnosis not present

## 2020-01-19 ENCOUNTER — Encounter (INDEPENDENT_AMBULATORY_CARE_PROVIDER_SITE_OTHER): Payer: Medicare HMO | Admitting: Ophthalmology

## 2020-01-19 ENCOUNTER — Other Ambulatory Visit: Payer: Self-pay

## 2020-01-19 DIAGNOSIS — I10 Essential (primary) hypertension: Secondary | ICD-10-CM | POA: Diagnosis not present

## 2020-01-19 DIAGNOSIS — H348322 Tributary (branch) retinal vein occlusion, left eye, stable: Secondary | ICD-10-CM

## 2020-01-19 DIAGNOSIS — H353221 Exudative age-related macular degeneration, left eye, with active choroidal neovascularization: Secondary | ICD-10-CM | POA: Diagnosis not present

## 2020-01-19 DIAGNOSIS — H43811 Vitreous degeneration, right eye: Secondary | ICD-10-CM

## 2020-01-19 DIAGNOSIS — H35033 Hypertensive retinopathy, bilateral: Secondary | ICD-10-CM

## 2020-01-20 DIAGNOSIS — M1990 Unspecified osteoarthritis, unspecified site: Secondary | ICD-10-CM | POA: Diagnosis not present

## 2020-01-20 DIAGNOSIS — Z9181 History of falling: Secondary | ICD-10-CM | POA: Diagnosis not present

## 2020-01-20 DIAGNOSIS — I10 Essential (primary) hypertension: Secondary | ICD-10-CM | POA: Diagnosis not present

## 2020-01-20 DIAGNOSIS — Z7902 Long term (current) use of antithrombotics/antiplatelets: Secondary | ICD-10-CM | POA: Diagnosis not present

## 2020-01-20 DIAGNOSIS — Z96641 Presence of right artificial hip joint: Secondary | ICD-10-CM | POA: Diagnosis not present

## 2020-01-20 DIAGNOSIS — H9193 Unspecified hearing loss, bilateral: Secondary | ICD-10-CM | POA: Diagnosis not present

## 2020-01-20 DIAGNOSIS — M5116 Intervertebral disc disorders with radiculopathy, lumbar region: Secondary | ICD-10-CM | POA: Diagnosis not present

## 2020-01-20 DIAGNOSIS — Z96659 Presence of unspecified artificial knee joint: Secondary | ICD-10-CM | POA: Diagnosis not present

## 2020-01-25 DIAGNOSIS — H4312 Vitreous hemorrhage, left eye: Secondary | ICD-10-CM | POA: Diagnosis not present

## 2020-01-25 DIAGNOSIS — H532 Diplopia: Secondary | ICD-10-CM | POA: Diagnosis not present

## 2020-01-25 DIAGNOSIS — H0102A Squamous blepharitis right eye, upper and lower eyelids: Secondary | ICD-10-CM | POA: Diagnosis not present

## 2020-01-25 DIAGNOSIS — H353131 Nonexudative age-related macular degeneration, bilateral, early dry stage: Secondary | ICD-10-CM | POA: Diagnosis not present

## 2020-01-25 DIAGNOSIS — H401123 Primary open-angle glaucoma, left eye, severe stage: Secondary | ICD-10-CM | POA: Diagnosis not present

## 2020-01-25 DIAGNOSIS — H401112 Primary open-angle glaucoma, right eye, moderate stage: Secondary | ICD-10-CM | POA: Diagnosis not present

## 2020-01-25 DIAGNOSIS — H348322 Tributary (branch) retinal vein occlusion, left eye, stable: Secondary | ICD-10-CM | POA: Diagnosis not present

## 2020-01-25 DIAGNOSIS — Z9889 Other specified postprocedural states: Secondary | ICD-10-CM | POA: Diagnosis not present

## 2020-01-25 DIAGNOSIS — D492 Neoplasm of unspecified behavior of bone, soft tissue, and skin: Secondary | ICD-10-CM | POA: Diagnosis not present

## 2020-01-27 DIAGNOSIS — Z9181 History of falling: Secondary | ICD-10-CM | POA: Diagnosis not present

## 2020-01-27 DIAGNOSIS — Z96659 Presence of unspecified artificial knee joint: Secondary | ICD-10-CM | POA: Diagnosis not present

## 2020-01-27 DIAGNOSIS — I10 Essential (primary) hypertension: Secondary | ICD-10-CM | POA: Diagnosis not present

## 2020-01-27 DIAGNOSIS — H9193 Unspecified hearing loss, bilateral: Secondary | ICD-10-CM | POA: Diagnosis not present

## 2020-01-27 DIAGNOSIS — Z96641 Presence of right artificial hip joint: Secondary | ICD-10-CM | POA: Diagnosis not present

## 2020-01-27 DIAGNOSIS — M5116 Intervertebral disc disorders with radiculopathy, lumbar region: Secondary | ICD-10-CM | POA: Diagnosis not present

## 2020-01-27 DIAGNOSIS — Z7902 Long term (current) use of antithrombotics/antiplatelets: Secondary | ICD-10-CM | POA: Diagnosis not present

## 2020-01-27 DIAGNOSIS — M1990 Unspecified osteoarthritis, unspecified site: Secondary | ICD-10-CM | POA: Diagnosis not present

## 2020-02-01 NOTE — Progress Notes (Signed)
No ICM remote transmission received for 01/18/2020 or 02/01/2020 and next ICM transmission scheduled for 03/02/2020.

## 2020-02-03 DIAGNOSIS — F332 Major depressive disorder, recurrent severe without psychotic features: Secondary | ICD-10-CM | POA: Diagnosis not present

## 2020-02-03 DIAGNOSIS — I10 Essential (primary) hypertension: Secondary | ICD-10-CM | POA: Diagnosis not present

## 2020-02-03 DIAGNOSIS — I2510 Atherosclerotic heart disease of native coronary artery without angina pectoris: Secondary | ICD-10-CM | POA: Diagnosis not present

## 2020-02-03 DIAGNOSIS — K219 Gastro-esophageal reflux disease without esophagitis: Secondary | ICD-10-CM | POA: Diagnosis not present

## 2020-02-03 DIAGNOSIS — I5020 Unspecified systolic (congestive) heart failure: Secondary | ICD-10-CM | POA: Diagnosis not present

## 2020-02-03 DIAGNOSIS — Z8546 Personal history of malignant neoplasm of prostate: Secondary | ICD-10-CM | POA: Diagnosis not present

## 2020-02-03 DIAGNOSIS — I4891 Unspecified atrial fibrillation: Secondary | ICD-10-CM | POA: Diagnosis not present

## 2020-02-03 DIAGNOSIS — E7800 Pure hypercholesterolemia, unspecified: Secondary | ICD-10-CM | POA: Diagnosis not present

## 2020-02-03 DIAGNOSIS — N183 Chronic kidney disease, stage 3 unspecified: Secondary | ICD-10-CM | POA: Diagnosis not present

## 2020-02-05 DIAGNOSIS — M1990 Unspecified osteoarthritis, unspecified site: Secondary | ICD-10-CM | POA: Diagnosis not present

## 2020-02-05 DIAGNOSIS — H9193 Unspecified hearing loss, bilateral: Secondary | ICD-10-CM | POA: Diagnosis not present

## 2020-02-05 DIAGNOSIS — Z96659 Presence of unspecified artificial knee joint: Secondary | ICD-10-CM | POA: Diagnosis not present

## 2020-02-05 DIAGNOSIS — Z7902 Long term (current) use of antithrombotics/antiplatelets: Secondary | ICD-10-CM | POA: Diagnosis not present

## 2020-02-05 DIAGNOSIS — I10 Essential (primary) hypertension: Secondary | ICD-10-CM | POA: Diagnosis not present

## 2020-02-05 DIAGNOSIS — M5116 Intervertebral disc disorders with radiculopathy, lumbar region: Secondary | ICD-10-CM | POA: Diagnosis not present

## 2020-02-05 DIAGNOSIS — Z96641 Presence of right artificial hip joint: Secondary | ICD-10-CM | POA: Diagnosis not present

## 2020-02-05 DIAGNOSIS — Z9181 History of falling: Secondary | ICD-10-CM | POA: Diagnosis not present

## 2020-02-13 DIAGNOSIS — I10 Essential (primary) hypertension: Secondary | ICD-10-CM | POA: Diagnosis not present

## 2020-02-13 DIAGNOSIS — M1990 Unspecified osteoarthritis, unspecified site: Secondary | ICD-10-CM | POA: Diagnosis not present

## 2020-02-13 DIAGNOSIS — Z9181 History of falling: Secondary | ICD-10-CM | POA: Diagnosis not present

## 2020-02-13 DIAGNOSIS — H9193 Unspecified hearing loss, bilateral: Secondary | ICD-10-CM | POA: Diagnosis not present

## 2020-02-13 DIAGNOSIS — Z96659 Presence of unspecified artificial knee joint: Secondary | ICD-10-CM | POA: Diagnosis not present

## 2020-02-13 DIAGNOSIS — Z7902 Long term (current) use of antithrombotics/antiplatelets: Secondary | ICD-10-CM | POA: Diagnosis not present

## 2020-02-13 DIAGNOSIS — M5116 Intervertebral disc disorders with radiculopathy, lumbar region: Secondary | ICD-10-CM | POA: Diagnosis not present

## 2020-02-13 DIAGNOSIS — Z96641 Presence of right artificial hip joint: Secondary | ICD-10-CM | POA: Diagnosis not present

## 2020-02-16 ENCOUNTER — Encounter (INDEPENDENT_AMBULATORY_CARE_PROVIDER_SITE_OTHER): Payer: Medicare HMO | Admitting: Ophthalmology

## 2020-02-16 ENCOUNTER — Other Ambulatory Visit: Payer: Self-pay

## 2020-02-16 DIAGNOSIS — H353112 Nonexudative age-related macular degeneration, right eye, intermediate dry stage: Secondary | ICD-10-CM | POA: Diagnosis not present

## 2020-02-16 DIAGNOSIS — H35033 Hypertensive retinopathy, bilateral: Secondary | ICD-10-CM

## 2020-02-16 DIAGNOSIS — I10 Essential (primary) hypertension: Secondary | ICD-10-CM

## 2020-02-16 DIAGNOSIS — H353221 Exudative age-related macular degeneration, left eye, with active choroidal neovascularization: Secondary | ICD-10-CM

## 2020-02-16 DIAGNOSIS — H43811 Vitreous degeneration, right eye: Secondary | ICD-10-CM | POA: Diagnosis not present

## 2020-02-16 DIAGNOSIS — H348322 Tributary (branch) retinal vein occlusion, left eye, stable: Secondary | ICD-10-CM

## 2020-02-18 DIAGNOSIS — M5116 Intervertebral disc disorders with radiculopathy, lumbar region: Secondary | ICD-10-CM | POA: Diagnosis not present

## 2020-02-18 DIAGNOSIS — Z7902 Long term (current) use of antithrombotics/antiplatelets: Secondary | ICD-10-CM | POA: Diagnosis not present

## 2020-02-18 DIAGNOSIS — Z96659 Presence of unspecified artificial knee joint: Secondary | ICD-10-CM | POA: Diagnosis not present

## 2020-02-18 DIAGNOSIS — I10 Essential (primary) hypertension: Secondary | ICD-10-CM | POA: Diagnosis not present

## 2020-02-18 DIAGNOSIS — Z96641 Presence of right artificial hip joint: Secondary | ICD-10-CM | POA: Diagnosis not present

## 2020-02-18 DIAGNOSIS — H9193 Unspecified hearing loss, bilateral: Secondary | ICD-10-CM | POA: Diagnosis not present

## 2020-02-18 DIAGNOSIS — Z9181 History of falling: Secondary | ICD-10-CM | POA: Diagnosis not present

## 2020-02-18 DIAGNOSIS — M1990 Unspecified osteoarthritis, unspecified site: Secondary | ICD-10-CM | POA: Diagnosis not present

## 2020-02-25 DIAGNOSIS — I10 Essential (primary) hypertension: Secondary | ICD-10-CM | POA: Diagnosis not present

## 2020-02-25 DIAGNOSIS — Z96659 Presence of unspecified artificial knee joint: Secondary | ICD-10-CM | POA: Diagnosis not present

## 2020-02-25 DIAGNOSIS — Z9181 History of falling: Secondary | ICD-10-CM | POA: Diagnosis not present

## 2020-02-25 DIAGNOSIS — Z7902 Long term (current) use of antithrombotics/antiplatelets: Secondary | ICD-10-CM | POA: Diagnosis not present

## 2020-02-25 DIAGNOSIS — M5116 Intervertebral disc disorders with radiculopathy, lumbar region: Secondary | ICD-10-CM | POA: Diagnosis not present

## 2020-02-25 DIAGNOSIS — Z96641 Presence of right artificial hip joint: Secondary | ICD-10-CM | POA: Diagnosis not present

## 2020-02-25 DIAGNOSIS — H9193 Unspecified hearing loss, bilateral: Secondary | ICD-10-CM | POA: Diagnosis not present

## 2020-02-25 DIAGNOSIS — M1990 Unspecified osteoarthritis, unspecified site: Secondary | ICD-10-CM | POA: Diagnosis not present

## 2020-03-02 ENCOUNTER — Ambulatory Visit (INDEPENDENT_AMBULATORY_CARE_PROVIDER_SITE_OTHER): Payer: Medicare Other

## 2020-03-02 DIAGNOSIS — Z950 Presence of cardiac pacemaker: Secondary | ICD-10-CM | POA: Diagnosis not present

## 2020-03-02 DIAGNOSIS — I5022 Chronic systolic (congestive) heart failure: Secondary | ICD-10-CM

## 2020-03-04 ENCOUNTER — Telehealth: Payer: Self-pay

## 2020-03-04 NOTE — Progress Notes (Signed)
EPIC Encounter for ICM Monitoring  Patient Name: [name] is a 87 y.o. male Date: 03/04/2020 Primary Care Physican: Lawerance Cruel, MD Primary Cardiologist:Varanasi  Electrophysiologist:Taylor LastWeight:180 lbs   Spoke with patient and reports coughing up clear sputum for the past few days.    CorVuethoracic impedancesuggesting possible fluid accumulation 02/20/2020 through 03/01/2020 and trending just under baseline normal for past 2 days.  Prescribed: Torsemide 20 mgTake 20-40 mg by mouth daily. Take one extra tablet of torsemide by mouth if weight gain of 3 pounds Potassium 10 mEqTake 10 mEq by mouth daily.  Labs: 05/18/2019 Creatinine 1.32, BUN 19, Potassium 3.8, Sodium 136, GFR 47-55 05/17/2019 Creatinine 1.07, BUN 16, Potassium 3.7, Sodium 136, GFR >60 05/16/2019 Creatinine 1.23, BUN 16, Potassium 3.9, Sodium 138, GFR 51-60 A complete set of results can be found in Results Review..  Recommendations:Advised to take 1 extra Torsemide tablet and 1 extra Potassium x 1 day as indicated on prescription  Follow-up plan: ICM clinic phone appointment on2/09/2020. 91 day device clinic remote transmission 05/19/2020.   EP/Cardiology Office Visits:Recalls 10/03/2019 with Dr.Varanasi and 05/02/2020 with DrTaylor.   Copy of ICM check sent to Dr.Taylor.  3 month ICM trend: 03/02/2020.    1 Year ICM trend:       Rosalene Billings, RN 03/04/2020 2:26 PM

## 2020-03-04 NOTE — Telephone Encounter (Signed)
Remote ICM transmission received.  Attempted call to patient regarding ICM remote transmission and left message to return call   

## 2020-03-15 ENCOUNTER — Encounter (INDEPENDENT_AMBULATORY_CARE_PROVIDER_SITE_OTHER): Payer: Medicare HMO | Admitting: Ophthalmology

## 2020-03-15 ENCOUNTER — Encounter (INDEPENDENT_AMBULATORY_CARE_PROVIDER_SITE_OTHER): Payer: Medicare Other | Admitting: Ophthalmology

## 2020-03-15 DIAGNOSIS — H353221 Exudative age-related macular degeneration, left eye, with active choroidal neovascularization: Secondary | ICD-10-CM

## 2020-03-15 DIAGNOSIS — I10 Essential (primary) hypertension: Secondary | ICD-10-CM | POA: Diagnosis not present

## 2020-03-15 DIAGNOSIS — H348322 Tributary (branch) retinal vein occlusion, left eye, stable: Secondary | ICD-10-CM

## 2020-03-15 DIAGNOSIS — H43811 Vitreous degeneration, right eye: Secondary | ICD-10-CM | POA: Diagnosis not present

## 2020-03-15 DIAGNOSIS — H353112 Nonexudative age-related macular degeneration, right eye, intermediate dry stage: Secondary | ICD-10-CM

## 2020-03-15 DIAGNOSIS — H35033 Hypertensive retinopathy, bilateral: Secondary | ICD-10-CM

## 2020-03-25 DIAGNOSIS — J988 Other specified respiratory disorders: Secondary | ICD-10-CM | POA: Diagnosis not present

## 2020-03-25 DIAGNOSIS — R051 Acute cough: Secondary | ICD-10-CM | POA: Diagnosis not present

## 2020-03-25 DIAGNOSIS — Z20822 Contact with and (suspected) exposure to covid-19: Secondary | ICD-10-CM | POA: Diagnosis not present

## 2020-03-25 DIAGNOSIS — R059 Cough, unspecified: Secondary | ICD-10-CM | POA: Diagnosis not present

## 2020-03-28 DIAGNOSIS — H0102A Squamous blepharitis right eye, upper and lower eyelids: Secondary | ICD-10-CM | POA: Diagnosis not present

## 2020-03-28 DIAGNOSIS — D492 Neoplasm of unspecified behavior of bone, soft tissue, and skin: Secondary | ICD-10-CM | POA: Diagnosis not present

## 2020-03-28 DIAGNOSIS — Z961 Presence of intraocular lens: Secondary | ICD-10-CM | POA: Diagnosis not present

## 2020-03-28 DIAGNOSIS — H348322 Tributary (branch) retinal vein occlusion, left eye, stable: Secondary | ICD-10-CM | POA: Diagnosis not present

## 2020-03-28 DIAGNOSIS — H353131 Nonexudative age-related macular degeneration, bilateral, early dry stage: Secondary | ICD-10-CM | POA: Diagnosis not present

## 2020-03-28 DIAGNOSIS — H4312 Vitreous hemorrhage, left eye: Secondary | ICD-10-CM | POA: Diagnosis not present

## 2020-03-28 DIAGNOSIS — H532 Diplopia: Secondary | ICD-10-CM | POA: Diagnosis not present

## 2020-03-28 DIAGNOSIS — H401123 Primary open-angle glaucoma, left eye, severe stage: Secondary | ICD-10-CM | POA: Diagnosis not present

## 2020-03-28 DIAGNOSIS — H0102B Squamous blepharitis left eye, upper and lower eyelids: Secondary | ICD-10-CM | POA: Diagnosis not present

## 2020-03-28 DIAGNOSIS — H43813 Vitreous degeneration, bilateral: Secondary | ICD-10-CM | POA: Diagnosis not present

## 2020-03-28 DIAGNOSIS — H401112 Primary open-angle glaucoma, right eye, moderate stage: Secondary | ICD-10-CM | POA: Diagnosis not present

## 2020-04-01 DIAGNOSIS — N35011 Post-traumatic bulbous urethral stricture: Secondary | ICD-10-CM | POA: Diagnosis not present

## 2020-04-05 ENCOUNTER — Ambulatory Visit (INDEPENDENT_AMBULATORY_CARE_PROVIDER_SITE_OTHER): Payer: Medicare Other

## 2020-04-05 DIAGNOSIS — Z950 Presence of cardiac pacemaker: Secondary | ICD-10-CM | POA: Diagnosis not present

## 2020-04-05 DIAGNOSIS — I5022 Chronic systolic (congestive) heart failure: Secondary | ICD-10-CM

## 2020-04-11 NOTE — Progress Notes (Signed)
EPIC Encounter for ICM Monitoring  Patient Name: [name] is a 87 y.o. male Date: 04/11/2020 Primary Care Physican: Lawerance Cruel, MD Primary Cardiologist:Varanasi  Electrophysiologist:Taylor LastWeight:180 lbs   Transmission reviewed.     CorVuethoracic impedancesuggesting normal fluid levels.  Prescribed: Torsemide 20 mgTake 20-40 mg by mouth daily. Take one extra tablet of torsemide by mouth if weight gain of 3 pounds Potassium 10 mEqTake 10 mEq by mouth daily.  Labs: 05/18/2019 Creatinine 1.32, BUN 19, Potassium 3.8, Sodium 136, GFR 47-55 05/17/2019 Creatinine 1.07, BUN 16, Potassium 3.7, Sodium 136, GFR >60 05/16/2019 Creatinine 1.23, BUN 16, Potassium 3.9, Sodium 138, GFR 51-60 A complete set of results can be found in Results Review.  Recommendations:No changes.   Follow-up plan: ICM clinic phone appointment on3/14/2022. 91 day device clinic remote transmission3/24/2022.   EP/Cardiology Office Visits:Recalls 10/03/2019 with Dr.Varanasi and 05/02/2020 with DrTaylor.   Copy of ICM check sent to Dr.Taylor.  3 month ICM trend: 04/07/2020.    1 Year ICM trend:       Rosalene Billings, RN 04/11/2020 4:00 PM

## 2020-04-12 ENCOUNTER — Encounter (INDEPENDENT_AMBULATORY_CARE_PROVIDER_SITE_OTHER): Payer: Medicare Other | Admitting: Ophthalmology

## 2020-04-12 ENCOUNTER — Other Ambulatory Visit: Payer: Self-pay

## 2020-04-12 DIAGNOSIS — H353112 Nonexudative age-related macular degeneration, right eye, intermediate dry stage: Secondary | ICD-10-CM | POA: Diagnosis not present

## 2020-04-12 DIAGNOSIS — H353221 Exudative age-related macular degeneration, left eye, with active choroidal neovascularization: Secondary | ICD-10-CM | POA: Diagnosis not present

## 2020-04-12 DIAGNOSIS — H43813 Vitreous degeneration, bilateral: Secondary | ICD-10-CM | POA: Diagnosis not present

## 2020-04-12 DIAGNOSIS — I10 Essential (primary) hypertension: Secondary | ICD-10-CM

## 2020-04-12 DIAGNOSIS — H35033 Hypertensive retinopathy, bilateral: Secondary | ICD-10-CM

## 2020-04-12 DIAGNOSIS — H34832 Tributary (branch) retinal vein occlusion, left eye, with macular edema: Secondary | ICD-10-CM

## 2020-04-17 DIAGNOSIS — I4891 Unspecified atrial fibrillation: Secondary | ICD-10-CM | POA: Diagnosis not present

## 2020-04-17 DIAGNOSIS — E7800 Pure hypercholesterolemia, unspecified: Secondary | ICD-10-CM | POA: Diagnosis not present

## 2020-04-17 DIAGNOSIS — K219 Gastro-esophageal reflux disease without esophagitis: Secondary | ICD-10-CM | POA: Diagnosis not present

## 2020-04-17 DIAGNOSIS — I5020 Unspecified systolic (congestive) heart failure: Secondary | ICD-10-CM | POA: Diagnosis not present

## 2020-04-17 DIAGNOSIS — N183 Chronic kidney disease, stage 3 unspecified: Secondary | ICD-10-CM | POA: Diagnosis not present

## 2020-04-17 DIAGNOSIS — I10 Essential (primary) hypertension: Secondary | ICD-10-CM | POA: Diagnosis not present

## 2020-04-17 DIAGNOSIS — M1990 Unspecified osteoarthritis, unspecified site: Secondary | ICD-10-CM | POA: Diagnosis not present

## 2020-04-17 DIAGNOSIS — I2510 Atherosclerotic heart disease of native coronary artery without angina pectoris: Secondary | ICD-10-CM | POA: Diagnosis not present

## 2020-04-18 ENCOUNTER — Encounter (INDEPENDENT_AMBULATORY_CARE_PROVIDER_SITE_OTHER): Payer: Medicare HMO | Admitting: Ophthalmology

## 2020-05-03 DIAGNOSIS — I10 Essential (primary) hypertension: Secondary | ICD-10-CM | POA: Diagnosis not present

## 2020-05-03 DIAGNOSIS — N183 Chronic kidney disease, stage 3 unspecified: Secondary | ICD-10-CM | POA: Diagnosis not present

## 2020-05-03 DIAGNOSIS — M1990 Unspecified osteoarthritis, unspecified site: Secondary | ICD-10-CM | POA: Diagnosis not present

## 2020-05-03 DIAGNOSIS — I4891 Unspecified atrial fibrillation: Secondary | ICD-10-CM | POA: Diagnosis not present

## 2020-05-03 DIAGNOSIS — E7800 Pure hypercholesterolemia, unspecified: Secondary | ICD-10-CM | POA: Diagnosis not present

## 2020-05-03 DIAGNOSIS — I5020 Unspecified systolic (congestive) heart failure: Secondary | ICD-10-CM | POA: Diagnosis not present

## 2020-05-03 DIAGNOSIS — K219 Gastro-esophageal reflux disease without esophagitis: Secondary | ICD-10-CM | POA: Diagnosis not present

## 2020-05-03 DIAGNOSIS — I2510 Atherosclerotic heart disease of native coronary artery without angina pectoris: Secondary | ICD-10-CM | POA: Diagnosis not present

## 2020-05-12 ENCOUNTER — Telehealth: Payer: Self-pay | Admitting: *Deleted

## 2020-05-12 ENCOUNTER — Telehealth: Payer: Self-pay

## 2020-05-12 DIAGNOSIS — M5136 Other intervertebral disc degeneration, lumbar region: Secondary | ICD-10-CM | POA: Diagnosis not present

## 2020-05-12 DIAGNOSIS — M19022 Primary osteoarthritis, left elbow: Secondary | ICD-10-CM | POA: Diagnosis not present

## 2020-05-12 DIAGNOSIS — M5416 Radiculopathy, lumbar region: Secondary | ICD-10-CM | POA: Diagnosis not present

## 2020-05-12 NOTE — Telephone Encounter (Signed)
I spoke with the patient and he agreed to do missed HF transmission in a few minutes.

## 2020-05-12 NOTE — Telephone Encounter (Signed)
   Primary Cardiologist: Larae Grooms, MD  Chart reviewed as part of pre-operative protocol coverage. Because of [name]'s past medical history and time since last visit, he will require a follow-up visit in order to better assess preoperative cardiovascular risk.  Pre-op covering staff: - Please schedule appointment and call patient to inform them. If patient already had an upcoming appointment within acceptable timeframe, please add "pre-op clearance" to the appointment notes so provider is aware. - Please contact requesting surgeon's office via preferred method (i.e, phone, fax) to inform them of need for appointment prior to surgery.  Per previous recommendation, would be reasonable to hold plavix 5 days prior to his upcoming procedure if no change in symptoms given lack of apparent change in cardiac history.  Abigail Butts, PA-C  05/12/2020, 4:05 PM

## 2020-05-12 NOTE — Telephone Encounter (Signed)
   [facility] Pre-operative Risk Assessment    HEARTCARE STAFF: - Please ensure there is not already an duplicate clearance open for this procedure. - Under Visit Info/Reason for Call, type in Other and utilize the format Clearance MM/DD/YY or Clearance TBD. Do not use dashes or single digits. - If request is for dental extraction, please clarify the # of teeth to be extracted.  Request for surgical clearance:  1. What type of surgery is being performed? SPINAL INJECTIONS   2. When is this surgery scheduled? TBD STAT  3. What type of clearance is required (medical clearance vs. Pharmacy clearance to hold med vs. Both)? MEDICAL  4. Are there any medications that need to be held prior to surgery and how long? PLAVIX x 7 DAYS PRIOR   5. Practice name and name of physician performing surgery? [facility]; DR. Lenord Carbo   6. What is the office phone number? [phone]   7.   What is the office fax number? [phone]  8.   Anesthesia type (None, local, MAC, general) ? NOT LISTED    [name] 05/12/2020, 3:14 PM  _________________________________________________________________   (provider comments below)

## 2020-05-12 NOTE — Telephone Encounter (Signed)
I tried to reach pt to schedule a pre op appt. I did not see anything available at [facility] and was going to offer an appt tomorrow at the [facility] with Coletta Memos, [place].

## 2020-05-13 ENCOUNTER — Encounter: Payer: Self-pay | Admitting: General Practice

## 2020-05-13 ENCOUNTER — Ambulatory Visit: Payer: Medicare Other | Admitting: General Practice

## 2020-05-13 ENCOUNTER — Other Ambulatory Visit: Payer: Self-pay

## 2020-05-13 VITALS — BP 130/70 | HR 81 | Ht 65.0 in | Wt 162.0 lb

## 2020-05-13 DIAGNOSIS — Z01810 Encounter for preprocedural cardiovascular examination: Secondary | ICD-10-CM | POA: Diagnosis not present

## 2020-05-13 DIAGNOSIS — I4821 Permanent atrial fibrillation: Secondary | ICD-10-CM | POA: Diagnosis not present

## 2020-05-13 DIAGNOSIS — I10 Essential (primary) hypertension: Secondary | ICD-10-CM

## 2020-05-13 DIAGNOSIS — I5022 Chronic systolic (congestive) heart failure: Secondary | ICD-10-CM

## 2020-05-13 NOTE — Telephone Encounter (Signed)
Pt has pre op appt with Coletta Memos, FNP today at 1:45 pm. I will send notes to FNP for appt today.

## 2020-05-13 NOTE — Progress Notes (Signed)
No ICM remote transmission received for 05/09/2020 and next ICM transmission scheduled for 05/23/2020.

## 2020-05-13 NOTE — Progress Notes (Signed)
Cardiology Clinic Note   Patient Name: [name] Date of Encounter: 05/13/2020  Primary Care Provider:  Lawerance Cruel, MD Primary Cardiologist:  Larae Grooms, MD  Patient Profile    [name] 87 year old male presents the clinic today for preoperative cardiac evaluation.  Past Medical History    Past Medical History:  Diagnosis Date  . Anxiety   . Arthritis   . Atrial fibrillation, permanent ([place]) 01/07/2016  . Blood transfusion    "related to a surgery"  . BPH (benign prostatic hypertrophy) with urinary obstruction    s/p turp yrs ago  . Bradycardia    a. Amio d/c'd 08/2013; brady arrest 08/2013 after PCI >>> recurrent AF >>> Amiodarone restarted. b. Pacemaker being considered in 11/2014.  [name] CAD (coronary artery disease)    a. s/p MI and prior PCI of LAD;  b. LHC (08/2013):  prox LAD 60-70%, mid LAD stents ok, ostial lesion at Dx jailed by stent, mild CFX and RCA disesase >>>  PCI (09/08/13):  rotational atherectomy + Promus DES to prox LAD  . Cardiomyopathy ([place])    a. Echo (08/2013):  EF 30-35%, AS hypokinesis, Gr 1 diast dysfn, mild MR, mild LAE >>> b. improved EF 50-55% by echo 8/15. c. EF down again by echo 12/2014 to 30-35% but 51% by nuc.  [name] Chronic lower back pain   . Chronic systolic CHF (congestive heart failure) ([place])   . CKD (chronic kidney disease), stage III ([place])    a. Per review of labs baseline Cr 1.1-1.3.  [name] DDD (degenerative disc disease)    chronic back pain  . Depression   . Diverticulitis of colon with bleeding    s/p sigmoid resection '88  . DJD (degenerative joint disease) hips and knees   s/p bilateral total replacements  . GERD (gastroesophageal reflux disease)    occ. take prevacid  . Glaucoma   . Hearing impaired person, right   . History of GI diverticular bleed april 2012   transfused blood and resolved without surgical intervention  . Hypertension   . Impaired hearing bilateral    only left hearing aid  .  Impotence, organic    s/p penile prosthesis 1990's  . Incomplete bladder emptying   . LBBB (left bundle branch block)   . Meningioma ([place]) right -sided w/ right VI palsy   followed by dr Gaynell Face  . Mitral regurgitation    a. Mild-mod by echo 12/2014.  [name] Myocardial infarction [facility]) 1980's- medical intervention   "so mild I didn't know I'd had it"  . PAF (paroxysmal atrial fibrillation) (H[facility])    not on coumadin due to hx of GI bleed.  . Prostate cancer ([place]) 11/30/13   Gleason 8, volume 22.14 cc  . Sleep apnea    non-compliant cpap  . Uses hearing aid    left   . Vitreous hemorrhage ([facility])    related to branch retinal vein occlusion left eye  . Wears glasses    Past Surgical History:  Procedure Laterality Date  . APPENDECTOMY    . CARDIAC CATHETERIZATION  2007   noncritical cad (results w/ chart)  . CARDIOVERSION N/A 10/13/2015   Procedure: CARDIOVERSION;  Surgeon: Josue Hector, MD;  Location: [place];  Service: Cardiovascular;  Laterality: N/A;  . CATARACT EXTRACTION W/ INTRAOCULAR LENS  IMPLANT, BILATERAL Bilateral ~ 2000  . CHOLECYSTECTOMY    . CLOSED REDUCTION HIP DISLOCATION Right "several"  . CORONARY ANGIOPLASTY WITH STENT PLACEMENT  08-03-08  drug-eluting stent x2 distal and mid lad  . EP IMPLANTABLE DEVICE N/A 01/31/2015   Procedure: BiV Pacemaker Insertion CRT-P;  Surgeon: Evans Lance, MD;  Location: [place] CV LAB;  Service: Cardiovascular;  Laterality: N/A;  . EP IMPLANTABLE DEVICE N/A 02/07/2015   Procedure: Lead Revision/Repair;  Surgeon: Deboraha Sprang, MD;  Location: [place] CV LAB;  Service: Cardiovascular;  Laterality: N/A;  . ESOPHAGOGASTRODUODENOSCOPY N/A 02/11/2014   Procedure: ESOPHAGOGASTRODUODENOSCOPY (EGD);  Surgeon: Cleotis Nipper, MD;  Location: [facility] ENDOSCOPY;  Service: Endoscopy;  Laterality: N/A;  . gamma knife radiation  2000   [facility] for meningioma, last eval 2013- no change  . GAS INSERTION Left 07/14/2019   Procedure: INSERTION  OF GAS;  Surgeon: Hayden Pedro, MD;  Location: [place];  Service: Ophthalmology;  Laterality: Left;  . INGUINAL HERNIA REPAIR Bilateral   . INNER EAR SURGERY Right yrs ago   "trying to get my hearing back  . LEFT HEART CATHETERIZATION WITH CORONARY ANGIOGRAM N/A 09/04/2013   Procedure: LEFT HEART CATHETERIZATION WITH CORONARY ANGIOGRAM;  Surgeon: Jettie Booze, MD;  Location: [facility] CATH LAB;  Service: Cardiovascular;  Laterality: N/A;  . PARS PLANA VITRECTOMY Left 07/14/2019   27 GAUGE  . PARS PLANA VITRECTOMY 27 GAUGE Left 07/14/2019   Procedure: PARS PLANA VITRECTOMY 27 GAUGE;  Surgeon: Hayden Pedro, MD;  Location: [place];  Service: Ophthalmology;  Laterality: Left;  . PENILE PROSTHESIS IMPLANT  1990's  . PERCUTANEOUS CORONARY ROTOBLATOR INTERVENTION (PCI-R) N/A 09/08/2013   Procedure: PERCUTANEOUS CORONARY ROTOBLATOR INTERVENTION (PCI-R);  Surgeon: Jettie Booze, MD;  Location: [facility] CATH LAB;  Service: Cardiovascular;  Laterality: N/A;  . PHOTOCOAGULATION WITH LASER Left 07/14/2019   Procedure: PHOTOCOAGULATION WITH LASER;  Surgeon: Hayden Pedro, MD;  Location: [place];  Service: Ophthalmology;  Laterality: Left;  . PROSTATE BIOPSY  11/30/13   Gleason 8, vol 22.14 cc  . REVISION TOTAL KNEE ARTHROPLASTY Right   . SHOULDER OPEN ROTATOR CUFF REPAIR Left   . TOTAL HIP ARTHROPLASTY Right 03-25-08--  . TOTAL HIP ARTHROPLASTY Left 2005  . TOTAL HIP REVISION Right 3-4 times  . TOTAL KNEE ARTHROPLASTY Right 2004  . TOTAL KNEE ARTHROPLASTY Left 1997  . TRANSURETHRAL RESECTION OF PROSTATE  "years ago"  . TRANSURETHRAL RESECTION OF PROSTATE  01/08/2011   Procedure: TRANSURETHRAL RESECTION OF THE PROSTATE (TURP);  Surgeon: Franchot Gallo;  Location: [place];  Service: Urology;  Laterality: N/A;  GYRUS     Allergies  Allergies  Allergen Reactions  . Codeine Anxiety and Other (See Comments)    Insomnia/ hyper  . Omeprazole Nausea And Vomiting and Other (See  Comments)    GI upset, insomnia  . Warfarin Sodium Anxiety and Other (See Comments)    Hx GI bleed    History of Present Illness    Mr. Strick has a PMH of symptomatic bradycardia, chronic systolic heart failure with normalized EF, lung disease, hypertension, and is status post BiV PPM.  He was seen by Dr. Lovena Le on 04/30/2019.  During that time he denied chest pain and was felt to be class II NYHA.  He reported he had been vaccinated for COVID-19.  He presents the clinic today for preoperative cardiac evaluation and states he feels well today.  He is limited in his physical activity due to his back pain.  He reports that he does not notice any fast or irregular heartbeats.  His blood pressure has been well controlled.  Has not  noticed any lower extremity edema and reports compliance with his medications.  His EKG today shows a ventricular paced rhythm with PVCs.  We will hold his Plavix for 7 days prior to his procedure and have him follow-up with Dr. Irish Lack in 12 months.  Today he denies chest pain, shortness of breath, lower extremity edema, fatigue, palpitations, melena, hematuria, hemoptysis, diaphoresis, weakness, presyncope, syncope, orthopnea, and PND.   Home Medications    Prior to Admission medications   Medication Sig Start Date End Date Taking? Authorizing Provider  bacitracin-polymyxin b (POLYSPORIN) ophthalmic ointment Place 1 application into the left eye 3 (three) times daily. apply to eye every 12 hours while awake Patient not taking: Reported on 12/04/2019 07/15/19   Hayden Pedro, MD  brimonidine (ALPHAGAN P) 0.1 % SOLN Place 1 drop into both eyes 2 (two) times daily.  08/11/15   [provider]  buPROPion (WELLBUTRIN XL) 150 MG 24 hr tablet Take 150 mg by mouth daily.    [provider]  clopidogrel (PLAVIX) 75 MG tablet TAKE 1 TABLET EVERY DAY Patient taking differently: Take 75 mg by mouth daily.  11/02/16   Jettie Booze, MD  cyclobenzaprine  (FLEXERIL) 5 MG tablet Take 2.5 mg by mouth 3 (three) times daily as needed for muscle spasms.  11/20/18   [provider]  diazepam (VALIUM) 5 MG tablet Take 1 tablet (5 mg total) by mouth every 12 (twelve) hours as needed for anxiety. 01/07/16   Isaiah Serge, NP  dorzolamide-timolol (COSOPT) 22.3-6.8 MG/ML ophthalmic solution Place 1 drop into both eyes 2 (two) times daily.  11/12/13   [provider]  gatifloxacin (ZYMAXID) 0.5 % SOLN Place 1 drop into the left eye 4 (four) times daily. Patient not taking: Reported on 12/04/2019 07/15/19   Hayden Pedro, MD  latanoprost (XALATAN) 0.005 % ophthalmic solution Place 1 drop into the left eye at bedtime. 09/22/19   [provider]  Multiple Vitamins-Minerals (MULTIVITAMIN WITH MINERALS) tablet Take 1 tablet by mouth daily.    [provider]  nitroGLYCERIN (NITROSTAT) 0.4 MG SL tablet Place 1 tablet (0.4 mg total) under the tongue every 5 (five) minutes as needed for chest pain. 04/21/18   Evans Lance, MD  OVER THE COUNTER MEDICATION Take 1 capsule by mouth daily. IBguard    [provider]  pantoprazole (PROTONIX) 40 MG tablet Take 1 tablet by mouth daily. 03/28/19   [provider]  potassium chloride (K-DUR,KLOR-CON) 10 MEQ tablet TAKE 1 TABLET EVERY DAY AS NEEDED. WHEN YOU TAKE YOUR FUROSEMIDE. Patient taking differently: Take 10 mEq by mouth daily.  11/02/16   Jettie Booze, MD  prednisoLONE acetate (PRED FORTE) 1 % ophthalmic suspension Place 1 drop into the left eye 4 (four) times daily. Patient not taking: Reported on 12/04/2019 07/15/19   Hayden Pedro, MD  Probiotic Product (PROBIOTIC PO) Take 1 tablet by mouth daily. ALIGN    [provider]  psyllium (METAMUCIL) 58.6 % packet Take 1 packet by mouth daily with supper. Mix in 8 oz liquid and drink    [provider]  tamsulosin (FLOMAX) 0.4 MG CAPS capsule Take 0.4 mg by mouth daily. 05/17/18   [provider]  torsemide (DEMADEX) 20 MG tablet Take one extra tablet of torsemide by mouth if weight gain of 3 pounds Patient taking differently: Take 20 mg by mouth daily. Take one extra tablet of torsemide by mouth if weight gain of 3 pounds 08/28/17  Evans Lance, MD  traMADol (ULTRAM) 50 MG tablet Take 50 mg by mouth every 6 (six) hours as needed for moderate pain.  03/15/15   [provider]  traZODone (DESYREL) 50 MG tablet Take 50 mg by mouth at bedtime as needed for sleep.     [provider]    Family History    Family History  Problem Relation Age of Onset  . Heart disease Mother   . Heart attack Mother   . Emphysema Father   . Cancer Brother        liver cancer  . Cancer Other    He indicated that his mother is deceased. He indicated that his father is deceased. He indicated that his sister is alive. He indicated that his brother is deceased. He indicated that his maternal grandmother is deceased. He indicated that his maternal grandfather is deceased. He indicated that his paternal grandmother is deceased. He indicated that his paternal grandfather is deceased. He indicated that the status of his other is unknown.  Social History    Social History   Socioeconomic History  . Marital status: Widowed    Spouse name: Not on file  . Number of children: Not on file  . Years of education: Not on file  . Highest education level: Not on file  Occupational History  . Not on file  Tobacco Use  . Smoking status: Former Smoker    Packs/day: 1.00    Years: 14.00    Pack years: 14.00    Types: Cigarettes    Quit date: 01/05/1959    Years since quitting: 61.3  . Smokeless tobacco: Never Used  Vaping Use  . Vaping Use: Never used  Substance and Sexual Activity  . Alcohol use: Yes    Comment: social   . Drug use: No  . Sexual activity: Not Currently  Other Topics Concern  . Not on file  Social History Narrative  . Not on file   Social Determinants of  Health   Financial Resource Strain: Not on file  Food Insecurity: Not on file  Transportation Needs: Not on file  Physical Activity: Not on file  Stress: Not on file  Social Connections: Not on file  Intimate Partner Violence: Not on file     Review of Systems    General:  No chills, fever, night sweats or weight changes.  Cardiovascular:  No chest pain, dyspnea on exertion, edema, orthopnea, palpitations, paroxysmal nocturnal dyspnea. Dermatological: No rash, lesions/masses Respiratory: No cough, dyspnea Urologic: No hematuria, dysuria Abdominal:   No nausea, vomiting, diarrhea, bright red blood per rectum, melena, or hematemesis Neurologic:  No visual changes, wkns, changes in mental status. All other systems reviewed and are otherwise negative except as noted above.  Physical Exam    VS:  BP 130/70   Pulse 81   Ht 5\' 5"  (1.651 m)   Wt 162 lb (73.5 kg)   BMI 26.96 kg/m  , BMI Body mass index is 26.96 kg/m. GEN: Well nourished, well developed, in no acute distress. HEENT: normal. Neck: Supple, no JVD, carotid bruits, or masses. Cardiac: RRR, no murmurs, rubs, or gallops. No clubbing, cyanosis, edema.  Radials/DP/PT 2+ and equal bilaterally.  Respiratory:  Respirations regular and unlabored, clear to auscultation bilaterally. GI: Soft, nontender, nondistended, BS + x 4. MS: no deformity or atrophy. Skin: warm and dry, no rash. Neuro:  Strength and sensation are intact. Psych: Normal affect.  Accessory Clinical Findings    Recent Labs:  05/16/2019: Magnesium 1.9 07/14/2019: ALT 16; BUN 13; Creatinine, Ser 1.29; Hemoglobin 13.5; Platelets 121; Potassium 4.0; Sodium 139   Recent Lipid Panel    Component Value Date/Time   CHOL 168 05/18/2019 0350   CHOL 157 12/31/2016 1012   TRIG 79 05/18/2019 0350   HDL 36 (L) 05/18/2019 0350   HDL 35 (L) 12/31/2016 1012   CHOLHDL 4.7 05/18/2019 0350   VLDL 16 05/18/2019 0350   LDLCALC 116 (H) 05/18/2019 0350   LDLCALC 93  12/31/2016 1012    ECG personally reviewed by me today-V paced rhythm occasional premature ventricular complexes and fusion complexes 81 bpm- No acute changes  Echocardiogram 08/06/2018 IMPRESSIONS    1. The left ventricle has normal systolic function, with an ejection  fraction of 55-60%. The cavity size was normal. Left ventricular diastolic  function could not be evaluated secondary to atrial fibrillation.  2. The right ventricle has normal systolic function. The cavity was  moderately enlarged. There is no increase in right ventricular wall  thickness.  3. Left atrial size was severely dilated.  4. Right atrial size was moderately dilated.  5. Mitral valve regurgitation is mild to moderate by color flow Doppler.  The MR jet is eccentric anteriorly directed.  6. The aortic valve is tricuspid. Mild sclerosis of the aortic valve.  7. The underlying heart rhythm was very irregular and could be atrial  fibrillation.   Assessment & Plan   1.  Chronic diastolic CHF-euvolemic today.  No increased DOE or activity intolerance.  Echocardiogram 6/20 showed LVEF of 55-60%, left atria severely dilated, right atrium moderately dilated, mild-moderate mitral valve regurgitation.  He remains physically active. Continue torsemide Heart healthy low-sodium diet-salty 6 given Increase physical activity as tolerated  Atrial fibrillation-EKG today shows V-paced rhythm with occasional premature ventricular complexes 81 bpm.  Previously noted to be BiV paced 70% of the time.  Pacemaker last interrogated on 05/24/2019.  Histograms appropriate that time.  Leads and battery stable and no changes were recommended.  Battery life remaining 89 months.  Essential hypertension-BP today .  Well-controlled at home. Continue torsemide Heart healthy low-sodium diet-salty 6 given Increase physical activity as tolerated  Preoperative cardiac evaluation-spinal injection, [place] Dr. Lenord Carbo   Primary Cardiologist: Larae Grooms, MD  Chart reviewed as part of pre-operative protocol coverage. Given past medical history and time since last visit, based on ACC/AHA guidelines, MANOAH DECKARD would be at acceptable risk for the planned procedure without further cardiovascular testing.   Patient was advised that if he develops new symptoms prior to surgery to contact our office to arrange a follow-up appointment.  He verbalized understanding.  His Plavix may be held for 7 days prior to his procedure.  Please resume as soon as hemostasis is achieved.  Disposition: Follow-up with Dr.Varanasi or APP in 12 months.   Jossie Ng. Sahmir Weatherbee NP-C    05/13/2020, 2:54 PM [facility] [place] [address] Office [phone] Fax [phone]  Notice: This dictation was prepared with Dragon dictation along with smaller phrase technology. Any transcriptional errors that result from this process are unintentional and may not be corrected upon review.  I spent 15 minutes examining this patient, reviewing medications, and using patient centered shared decision making involving her cardiac care.  Prior to her visit I spent greater than 20 minutes reviewing her past medical history,  medications, and prior cardiac tests.

## 2020-05-13 NOTE — Patient Instructions (Signed)
Medication Instructions:  The current medical regimen is effective;  continue present plan and medications as directed. Please refer to the Current Medication list given to you today.  *If you need a refill on your cardiac medications before your next appointment, please call your pharmacy*  Lab Work:   Testing/Procedures:  NONE    NONE  Special Instructions CLEARED FOR INJECTION-HOLD PLAVIX 7 DAYS BEFORE INJECTION AND RESTART ASAP AFTER PER DIRECTION IF MD-INJECTION  Follow-Up: Your next appointment:  12 month(s) In Person with You may see Larae Grooms, MD or one of the following Advanced Practice Providers on your designated Care Team:  Melina Copa, PA-C  Ermalinda Barrios, PA-C  Please call our office 2 months in advance to schedule this appointment   At [facility], you and your health needs are our priority.  As part of our continuing mission to provide you with exceptional heart care, we have created designated Provider Care Teams.  These Care Teams include your primary Cardiologist (physician) and Advanced Practice Providers (APPs -  Physician Assistants and Nurse Practitioners) who all work together to provide you with the care you need, when you need it.

## 2020-05-17 ENCOUNTER — Encounter (INDEPENDENT_AMBULATORY_CARE_PROVIDER_SITE_OTHER): Payer: Medicare Other | Admitting: Ophthalmology

## 2020-05-17 ENCOUNTER — Other Ambulatory Visit: Payer: Self-pay

## 2020-05-17 DIAGNOSIS — H35033 Hypertensive retinopathy, bilateral: Secondary | ICD-10-CM

## 2020-05-17 DIAGNOSIS — H34832 Tributary (branch) retinal vein occlusion, left eye, with macular edema: Secondary | ICD-10-CM | POA: Diagnosis not present

## 2020-05-17 DIAGNOSIS — H353221 Exudative age-related macular degeneration, left eye, with active choroidal neovascularization: Secondary | ICD-10-CM

## 2020-05-17 DIAGNOSIS — H353112 Nonexudative age-related macular degeneration, right eye, intermediate dry stage: Secondary | ICD-10-CM | POA: Diagnosis not present

## 2020-05-17 DIAGNOSIS — I10 Essential (primary) hypertension: Secondary | ICD-10-CM

## 2020-05-17 DIAGNOSIS — H43811 Vitreous degeneration, right eye: Secondary | ICD-10-CM | POA: Diagnosis not present

## 2020-05-19 ENCOUNTER — Ambulatory Visit (INDEPENDENT_AMBULATORY_CARE_PROVIDER_SITE_OTHER): Payer: Medicare Other

## 2020-05-19 DIAGNOSIS — I442 Atrioventricular block, complete: Secondary | ICD-10-CM

## 2020-05-21 LAB — CUP PACEART REMOTE DEVICE CHECK
Battery Remaining Longevity: 83 mo
Battery Remaining Percentage: 89 %
Battery Voltage: 2.95 V
Date Time Interrogation Session: 20220325182900
Implantable Lead Implant Date: 20161205
Implantable Lead Implant Date: 20161205
Implantable Lead Implant Date: 20161205
Implantable Lead Location: 753858
Implantable Lead Location: 753859
Implantable Lead Location: 753860
Implantable Pulse Generator Implant Date: 20161205
Lead Channel Impedance Value: 1050 Ohm
Lead Channel Impedance Value: 450 Ohm
Lead Channel Pacing Threshold Amplitude: 0.75 V
Lead Channel Pacing Threshold Amplitude: 1 V
Lead Channel Pacing Threshold Pulse Width: 0.5 ms
Lead Channel Pacing Threshold Pulse Width: 0.8 ms
Lead Channel Sensing Intrinsic Amplitude: 12 mV
Lead Channel Setting Pacing Amplitude: 1.75 V
Lead Channel Setting Pacing Amplitude: 2.5 V
Lead Channel Setting Pacing Pulse Width: 0.5 ms
Lead Channel Setting Pacing Pulse Width: 0.8 ms
Lead Channel Setting Sensing Sensitivity: 2 mV
Pulse Gen Model: 3262
Pulse Gen Serial Number: 7802901

## 2020-05-23 ENCOUNTER — Ambulatory Visit (INDEPENDENT_AMBULATORY_CARE_PROVIDER_SITE_OTHER): Payer: Medicare Other

## 2020-05-23 DIAGNOSIS — I5022 Chronic systolic (congestive) heart failure: Secondary | ICD-10-CM | POA: Diagnosis not present

## 2020-05-23 DIAGNOSIS — Z950 Presence of cardiac pacemaker: Secondary | ICD-10-CM

## 2020-05-24 NOTE — Progress Notes (Signed)
EPIC Encounter for ICM Monitoring  Patient Name: [name] is a 87 y.o. male Date: 05/24/2020 Primary Care Physican: Lawerance Cruel, MD Primary Cardiologist:Varanasi  Electrophysiologist:Taylor LastWeight:180 lbs   Spoke with patient and reports feeling well at this time.  Denies fluid or dehydration symptoms.    CorVuethoracic impedancesuggesting dryness.  Prescribed: Torsemide 20 mgTake 20-40 mg by mouth daily. Take one extra tablet of torsemide by mouth if weight gain of 3 pounds Potassium 10 mEqTake 10 mEq by mouth daily.  Labs: 05/18/2019 Creatinine 1.32, BUN 19, Potassium 3.8, Sodium 136, GFR 47-55 05/17/2019 Creatinine 1.07, BUN 16, Potassium 3.7, Sodium 136, GFR >60 05/16/2019 Creatinine 1.23, BUN 16, Potassium 3.9, Sodium 138, GFR 51-60 A complete set of results can be found in Results Review.  Recommendations:No changes and encouraged to call if experiencing any fluid symptoms.  Follow-up plan: ICM clinic phone appointment on5/03/2020. 91 day device clinic remote transmission6/23/2022.   EP/Cardiology Office Visits:Recalls 10/03/2019 with Dr.Varanasi and 05/02/2020 with DrTaylor.   Copy of ICM check sent to Dr.Taylor.  3 month ICM trend: 05/20/2020.    1 Year ICM trend:       Rosalene Billings, RN 05/24/2020 2:00 PM

## 2020-05-31 NOTE — Progress Notes (Signed)
Remote pacemaker transmission.   

## 2020-06-06 DIAGNOSIS — M1990 Unspecified osteoarthritis, unspecified site: Secondary | ICD-10-CM | POA: Diagnosis not present

## 2020-06-06 DIAGNOSIS — I2510 Atherosclerotic heart disease of native coronary artery without angina pectoris: Secondary | ICD-10-CM | POA: Diagnosis not present

## 2020-06-06 DIAGNOSIS — I10 Essential (primary) hypertension: Secondary | ICD-10-CM | POA: Diagnosis not present

## 2020-06-06 DIAGNOSIS — N183 Chronic kidney disease, stage 3 unspecified: Secondary | ICD-10-CM | POA: Diagnosis not present

## 2020-06-06 DIAGNOSIS — N400 Enlarged prostate without lower urinary tract symptoms: Secondary | ICD-10-CM | POA: Diagnosis not present

## 2020-06-06 DIAGNOSIS — F322 Major depressive disorder, single episode, severe without psychotic features: Secondary | ICD-10-CM | POA: Diagnosis not present

## 2020-06-06 DIAGNOSIS — I4891 Unspecified atrial fibrillation: Secondary | ICD-10-CM | POA: Diagnosis not present

## 2020-06-06 DIAGNOSIS — K219 Gastro-esophageal reflux disease without esophagitis: Secondary | ICD-10-CM | POA: Diagnosis not present

## 2020-06-06 DIAGNOSIS — E7800 Pure hypercholesterolemia, unspecified: Secondary | ICD-10-CM | POA: Diagnosis not present

## 2020-06-21 ENCOUNTER — Encounter (INDEPENDENT_AMBULATORY_CARE_PROVIDER_SITE_OTHER): Payer: Medicare Other | Admitting: Ophthalmology

## 2020-06-21 ENCOUNTER — Other Ambulatory Visit: Payer: Self-pay

## 2020-06-21 DIAGNOSIS — H35033 Hypertensive retinopathy, bilateral: Secondary | ICD-10-CM

## 2020-06-21 DIAGNOSIS — H34832 Tributary (branch) retinal vein occlusion, left eye, with macular edema: Secondary | ICD-10-CM | POA: Diagnosis not present

## 2020-06-21 DIAGNOSIS — H353221 Exudative age-related macular degeneration, left eye, with active choroidal neovascularization: Secondary | ICD-10-CM | POA: Diagnosis not present

## 2020-06-21 DIAGNOSIS — I10 Essential (primary) hypertension: Secondary | ICD-10-CM | POA: Diagnosis not present

## 2020-06-21 DIAGNOSIS — H43811 Vitreous degeneration, right eye: Secondary | ICD-10-CM | POA: Diagnosis not present

## 2020-06-21 DIAGNOSIS — H353112 Nonexudative age-related macular degeneration, right eye, intermediate dry stage: Secondary | ICD-10-CM

## 2020-06-27 ENCOUNTER — Ambulatory Visit (INDEPENDENT_AMBULATORY_CARE_PROVIDER_SITE_OTHER): Payer: Medicare HMO

## 2020-06-27 DIAGNOSIS — I5022 Chronic systolic (congestive) heart failure: Secondary | ICD-10-CM

## 2020-06-27 DIAGNOSIS — Z950 Presence of cardiac pacemaker: Secondary | ICD-10-CM

## 2020-07-01 NOTE — Progress Notes (Signed)
EPIC Encounter for ICM Monitoring  Patient Name: [name] is a 87 y.o. male Date: 07/01/2020 Primary Care Physican: Lawerance Cruel, MD Primary Cardiologist:Varanasi  Electrophysiologist:Taylor LastWeight:180 lbs   Spoke with patient and reports feeling well at this time.  Denies fluid or dehydration symptoms.  Patient said he ate something high in salt.  CorVuethoracic impedancesuggesting possible fluid accumulation 5/3 but trending back toward baseline.  Prescribed: Torsemide 20 mgTake 20-40 mg by mouth daily. Take one extra tablet of torsemide by mouth if weight gain of 3 pounds Potassium 10 mEqTake 10 mEq by mouth daily.  Labs: 05/18/2019 Creatinine 1.32, BUN 19, Potassium 3.8, Sodium 136, GFR 47-55 05/17/2019 Creatinine 1.07, BUN 16, Potassium 3.7, Sodium 136, GFR >60 05/16/2019 Creatinine 1.23, BUN 16, Potassium 3.9, Sodium 138, GFR 51-60 A complete set of results can be found in Results Review.  Recommendations:No changes and encouraged to call if experiencing any fluid symptoms.  Follow-up plan: ICM clinic phone appointment on6/13/2022. 91 day device clinic remote transmission6/23/2022.   EP/Cardiology Office Visits:Recalls 10/03/2019 with Dr.Varanasi and 05/02/2020 with DrTaylor.   Copy of ICM check sent to Dr.Taylor.  3 month ICM trend: 06/30/2020.    1 Year ICM trend:       Rosalene Billings, RN 07/01/2020 2:34 PM

## 2020-07-14 DIAGNOSIS — M5416 Radiculopathy, lumbar region: Secondary | ICD-10-CM | POA: Diagnosis not present

## 2020-07-26 ENCOUNTER — Encounter (INDEPENDENT_AMBULATORY_CARE_PROVIDER_SITE_OTHER): Payer: Medicare HMO | Admitting: Ophthalmology

## 2020-07-26 ENCOUNTER — Other Ambulatory Visit: Payer: Self-pay

## 2020-07-26 DIAGNOSIS — H34832 Tributary (branch) retinal vein occlusion, left eye, with macular edema: Secondary | ICD-10-CM | POA: Diagnosis not present

## 2020-07-26 DIAGNOSIS — H353112 Nonexudative age-related macular degeneration, right eye, intermediate dry stage: Secondary | ICD-10-CM | POA: Diagnosis not present

## 2020-07-26 DIAGNOSIS — H43811 Vitreous degeneration, right eye: Secondary | ICD-10-CM

## 2020-07-26 DIAGNOSIS — H35033 Hypertensive retinopathy, bilateral: Secondary | ICD-10-CM

## 2020-07-26 DIAGNOSIS — H353221 Exudative age-related macular degeneration, left eye, with active choroidal neovascularization: Secondary | ICD-10-CM

## 2020-07-26 DIAGNOSIS — I10 Essential (primary) hypertension: Secondary | ICD-10-CM

## 2020-07-27 DIAGNOSIS — M19022 Primary osteoarthritis, left elbow: Secondary | ICD-10-CM | POA: Diagnosis not present

## 2020-07-27 DIAGNOSIS — M62838 Other muscle spasm: Secondary | ICD-10-CM | POA: Diagnosis not present

## 2020-07-27 DIAGNOSIS — M461 Sacroiliitis, not elsewhere classified: Secondary | ICD-10-CM | POA: Diagnosis not present

## 2020-08-01 DIAGNOSIS — N400 Enlarged prostate without lower urinary tract symptoms: Secondary | ICD-10-CM | POA: Diagnosis not present

## 2020-08-01 DIAGNOSIS — F322 Major depressive disorder, single episode, severe without psychotic features: Secondary | ICD-10-CM | POA: Diagnosis not present

## 2020-08-01 DIAGNOSIS — N183 Chronic kidney disease, stage 3 unspecified: Secondary | ICD-10-CM | POA: Diagnosis not present

## 2020-08-01 DIAGNOSIS — K219 Gastro-esophageal reflux disease without esophagitis: Secondary | ICD-10-CM | POA: Diagnosis not present

## 2020-08-01 DIAGNOSIS — I2510 Atherosclerotic heart disease of native coronary artery without angina pectoris: Secondary | ICD-10-CM | POA: Diagnosis not present

## 2020-08-01 DIAGNOSIS — I10 Essential (primary) hypertension: Secondary | ICD-10-CM | POA: Diagnosis not present

## 2020-08-01 DIAGNOSIS — I4891 Unspecified atrial fibrillation: Secondary | ICD-10-CM | POA: Diagnosis not present

## 2020-08-01 DIAGNOSIS — M1990 Unspecified osteoarthritis, unspecified site: Secondary | ICD-10-CM | POA: Diagnosis not present

## 2020-08-01 DIAGNOSIS — E7800 Pure hypercholesterolemia, unspecified: Secondary | ICD-10-CM | POA: Diagnosis not present

## 2020-08-12 NOTE — Progress Notes (Unsigned)
No ICM remote transmission received for 08/08/2020 and next ICM transmission scheduled for 08/22/2020.

## 2020-08-16 DIAGNOSIS — H353232 Exudative age-related macular degeneration, bilateral, with inactive choroidal neovascularization: Secondary | ICD-10-CM | POA: Diagnosis not present

## 2020-08-16 DIAGNOSIS — H524 Presbyopia: Secondary | ICD-10-CM | POA: Diagnosis not present

## 2020-08-16 DIAGNOSIS — H401133 Primary open-angle glaucoma, bilateral, severe stage: Secondary | ICD-10-CM | POA: Diagnosis not present

## 2020-08-18 ENCOUNTER — Ambulatory Visit (INDEPENDENT_AMBULATORY_CARE_PROVIDER_SITE_OTHER): Payer: Medicare HMO

## 2020-08-18 DIAGNOSIS — I442 Atrioventricular block, complete: Secondary | ICD-10-CM | POA: Diagnosis not present

## 2020-08-19 LAB — CUP PACEART REMOTE DEVICE CHECK
Battery Remaining Longevity: 34 mo
Battery Remaining Percentage: 36 %
Battery Voltage: 2.95 V
Date Time Interrogation Session: 20220624124230
Implantable Lead Implant Date: 20161205
Implantable Lead Implant Date: 20161205
Implantable Lead Implant Date: 20161205
Implantable Lead Location: 753858
Implantable Lead Location: 753859
Implantable Lead Location: 753860
Implantable Pulse Generator Implant Date: 20161205
Lead Channel Impedance Value: 1025 Ohm
Lead Channel Impedance Value: 460 Ohm
Lead Channel Pacing Threshold Amplitude: 0.75 V
Lead Channel Pacing Threshold Amplitude: 1 V
Lead Channel Pacing Threshold Pulse Width: 0.5 ms
Lead Channel Pacing Threshold Pulse Width: 0.8 ms
Lead Channel Sensing Intrinsic Amplitude: 12 mV
Lead Channel Setting Pacing Amplitude: 1.75 V
Lead Channel Setting Pacing Amplitude: 2.5 V
Lead Channel Setting Pacing Pulse Width: 0.5 ms
Lead Channel Setting Pacing Pulse Width: 0.8 ms
Lead Channel Setting Sensing Sensitivity: 2 mV
Pulse Gen Model: 3262
Pulse Gen Serial Number: 7802901

## 2020-08-22 ENCOUNTER — Ambulatory Visit (INDEPENDENT_AMBULATORY_CARE_PROVIDER_SITE_OTHER): Payer: Medicare HMO

## 2020-08-22 DIAGNOSIS — Z950 Presence of cardiac pacemaker: Secondary | ICD-10-CM | POA: Diagnosis not present

## 2020-08-22 DIAGNOSIS — I5022 Chronic systolic (congestive) heart failure: Secondary | ICD-10-CM

## 2020-08-23 ENCOUNTER — Telehealth: Payer: Self-pay

## 2020-08-23 NOTE — Telephone Encounter (Signed)
Remote ICM transmission received.  Attempted call to patient regarding ICM remote transmission and left detailed message per DPR.  Advised to return call for any fluid symptoms or questions. Next ICM remote transmission scheduled 09/26/2020.

## 2020-08-23 NOTE — Progress Notes (Signed)
EPIC Encounter for ICM Monitoring  Patient Name: [name] is a 87 y.o. male Date: 08/23/2020 Primary Care Physican: Lawerance Cruel, MD Primary Cardiologist: Irish Lack Electrophysiologist: Lovena Le  Last Weight: 180 lbs     Attempted call to patient and unable to reach.  Left message to return call detailed message per DPR regarding transmission. Transmission reviewed.    CorVue thoracic impedance normal fluid levels.   Prescribed:  Torsemide 20 mg Take 20-40 mg by mouth daily. Take one extra tablet of torsemide by mouth if weight gain of 3 pounds Potassium 10 mEq Take 10 mEq by mouth daily.    Labs: 05/18/2019 Creatinine 1.32, BUN 19, Potassium 3.8, Sodium 136, GFR 47-55 05/17/2019 Creatinine 1.07, BUN 16, Potassium 3.7, Sodium 136, GFR >60 05/16/2019 Creatinine 1.23, BUN 16, Potassium 3.9, Sodium 138, GFR 51-60 A complete set of results can be found in Results Review.   Recommendations:  Left voice mail with ICM number and encouraged to call if experiencing any fluid symptoms.   Follow-up plan: ICM clinic phone appointment on 09/26/2020.  91 day device clinic remote transmission 11/17/2020.     EP/Cardiology Office Visits: Recalls 10/03/2019 with Dr. Irish Lack and 05/02/2020 with [name].     Copy of ICM check sent to Dr. Lovena Le.  3 month ICM trend: 08/23/2020.    1 Year ICM trend:       Rosalene Billings, RN 08/23/2020 3:43 PM

## 2020-08-26 ENCOUNTER — Encounter (INDEPENDENT_AMBULATORY_CARE_PROVIDER_SITE_OTHER): Payer: Medicare HMO | Admitting: Ophthalmology

## 2020-08-26 ENCOUNTER — Other Ambulatory Visit: Payer: Self-pay

## 2020-08-26 DIAGNOSIS — I10 Essential (primary) hypertension: Secondary | ICD-10-CM | POA: Diagnosis not present

## 2020-08-26 DIAGNOSIS — H353221 Exudative age-related macular degeneration, left eye, with active choroidal neovascularization: Secondary | ICD-10-CM

## 2020-08-26 DIAGNOSIS — H34832 Tributary (branch) retinal vein occlusion, left eye, with macular edema: Secondary | ICD-10-CM | POA: Diagnosis not present

## 2020-08-26 DIAGNOSIS — H353112 Nonexudative age-related macular degeneration, right eye, intermediate dry stage: Secondary | ICD-10-CM | POA: Diagnosis not present

## 2020-08-26 DIAGNOSIS — H43811 Vitreous degeneration, right eye: Secondary | ICD-10-CM | POA: Diagnosis not present

## 2020-09-06 NOTE — Progress Notes (Signed)
Remote pacemaker transmission.   

## 2020-09-08 DIAGNOSIS — M545 Low back pain, unspecified: Secondary | ICD-10-CM | POA: Diagnosis not present

## 2020-09-08 DIAGNOSIS — M25522 Pain in left elbow: Secondary | ICD-10-CM | POA: Diagnosis not present

## 2020-09-23 ENCOUNTER — Other Ambulatory Visit: Payer: Self-pay

## 2020-09-23 ENCOUNTER — Encounter (INDEPENDENT_AMBULATORY_CARE_PROVIDER_SITE_OTHER): Payer: Medicare HMO | Admitting: Ophthalmology

## 2020-09-23 DIAGNOSIS — H353221 Exudative age-related macular degeneration, left eye, with active choroidal neovascularization: Secondary | ICD-10-CM

## 2020-09-23 DIAGNOSIS — H35033 Hypertensive retinopathy, bilateral: Secondary | ICD-10-CM

## 2020-09-23 DIAGNOSIS — H34832 Tributary (branch) retinal vein occlusion, left eye, with macular edema: Secondary | ICD-10-CM

## 2020-09-23 DIAGNOSIS — H353112 Nonexudative age-related macular degeneration, right eye, intermediate dry stage: Secondary | ICD-10-CM | POA: Diagnosis not present

## 2020-09-23 DIAGNOSIS — I10 Essential (primary) hypertension: Secondary | ICD-10-CM

## 2020-09-23 DIAGNOSIS — H43813 Vitreous degeneration, bilateral: Secondary | ICD-10-CM | POA: Diagnosis not present

## 2020-09-26 ENCOUNTER — Other Ambulatory Visit: Payer: Self-pay

## 2020-09-26 ENCOUNTER — Encounter (HOSPITAL_BASED_OUTPATIENT_CLINIC_OR_DEPARTMENT_OTHER): Payer: Self-pay | Admitting: *Deleted

## 2020-09-26 ENCOUNTER — Emergency Department (HOSPITAL_BASED_OUTPATIENT_CLINIC_OR_DEPARTMENT_OTHER)
Admission: EM | Admit: 2020-09-26 | Discharge: 2020-09-26 | Disposition: A | Discharge status: Home / Self Care | Payer: Medicare HMO | Attending: Emergency Medicine | Admitting: Emergency Medicine

## 2020-09-26 DIAGNOSIS — I5022 Chronic systolic (congestive) heart failure: Secondary | ICD-10-CM | POA: Diagnosis not present

## 2020-09-26 DIAGNOSIS — I2510 Atherosclerotic heart disease of native coronary artery without angina pectoris: Secondary | ICD-10-CM | POA: Insufficient documentation

## 2020-09-26 DIAGNOSIS — I4891 Unspecified atrial fibrillation: Secondary | ICD-10-CM | POA: Insufficient documentation

## 2020-09-26 DIAGNOSIS — S81812A Laceration without foreign body, left lower leg, initial encounter: Secondary | ICD-10-CM | POA: Insufficient documentation

## 2020-09-26 DIAGNOSIS — Z96643 Presence of artificial hip joint, bilateral: Secondary | ICD-10-CM | POA: Insufficient documentation

## 2020-09-26 DIAGNOSIS — Z8546 Personal history of malignant neoplasm of prostate: Secondary | ICD-10-CM | POA: Diagnosis not present

## 2020-09-26 DIAGNOSIS — R58 Hemorrhage, not elsewhere classified: Secondary | ICD-10-CM | POA: Diagnosis not present

## 2020-09-26 DIAGNOSIS — Z87891 Personal history of nicotine dependence: Secondary | ICD-10-CM | POA: Diagnosis not present

## 2020-09-26 DIAGNOSIS — Z7902 Long term (current) use of antithrombotics/antiplatelets: Secondary | ICD-10-CM | POA: Diagnosis not present

## 2020-09-26 DIAGNOSIS — R0902 Hypoxemia: Secondary | ICD-10-CM | POA: Diagnosis not present

## 2020-09-26 DIAGNOSIS — N183 Chronic kidney disease, stage 3 unspecified: Secondary | ICD-10-CM | POA: Diagnosis not present

## 2020-09-26 DIAGNOSIS — S8992XA Unspecified injury of left lower leg, initial encounter: Secondary | ICD-10-CM | POA: Diagnosis present

## 2020-09-26 DIAGNOSIS — Z23 Encounter for immunization: Secondary | ICD-10-CM | POA: Diagnosis not present

## 2020-09-26 DIAGNOSIS — W228XXA Striking against or struck by other objects, initial encounter: Secondary | ICD-10-CM | POA: Diagnosis not present

## 2020-09-26 DIAGNOSIS — Z96653 Presence of artificial knee joint, bilateral: Secondary | ICD-10-CM | POA: Diagnosis not present

## 2020-09-26 DIAGNOSIS — I130 Hypertensive heart and chronic kidney disease with heart failure and stage 1 through stage 4 chronic kidney disease, or unspecified chronic kidney disease: Secondary | ICD-10-CM | POA: Diagnosis not present

## 2020-09-26 DIAGNOSIS — Y92512 Supermarket, store or market as the place of occurrence of the external cause: Secondary | ICD-10-CM | POA: Insufficient documentation

## 2020-09-26 DIAGNOSIS — Z79899 Other long term (current) drug therapy: Secondary | ICD-10-CM | POA: Diagnosis not present

## 2020-09-26 DIAGNOSIS — Y9301 Activity, walking, marching and hiking: Secondary | ICD-10-CM | POA: Insufficient documentation

## 2020-09-26 MED ORDER — LIDOCAINE-EPINEPHRINE (PF) 2 %-1:200000 IJ SOLN
10.0000 mL | Freq: Once | INTRAMUSCULAR | Status: AC
  Administered 2020-09-26: 10 mL
  Filled 2020-09-26: qty 20

## 2020-09-26 MED ORDER — TETANUS-DIPHTH-ACELL PERTUSSIS 5-2.5-18.5 LF-MCG/0.5 IM SUSY
0.5000 mL | PREFILLED_SYRINGE | Freq: Once | INTRAMUSCULAR | Status: AC
  Administered 2020-09-26: 0.5 mL via INTRAMUSCULAR
  Filled 2020-09-26: qty 0.5

## 2020-09-26 NOTE — ED Notes (Signed)
Pt states he was opening car door at [employer], corner of door caught his left lower leg, laceration 4 cm with tissue exposed. Pt is on blood thinners. Bleeding controlled.

## 2020-09-26 NOTE — ED Triage Notes (Signed)
Pt opening car door at [employer], corner of door caught lower left leg, 2 inch lac with tissue exposed, Bleeding controlled. Pt is on blood thinners.

## 2020-09-26 NOTE — ED Triage Notes (Signed)
Patient reports to the ER from the [employer] after cutting his leg. Patient was trying to walk out the door and cut his leg. Patient is alert and oriented but reportedly will need stitches per EMS

## 2020-09-26 NOTE — ED Notes (Signed)
ED Provider at bedside. 

## 2020-09-26 NOTE — Discharge Instructions (Addendum)
Apply antibiotic ointment to the wound daily.  Sutures should be removed in approximately 10 days.

## 2020-09-26 NOTE — ED Provider Notes (Signed)
[place] EMERGENCY DEPT Provider Note   CSN: [id number] Arrival date & time: 09/26/20  1112     History Chief Complaint  Patient presents with   Laceration    [name] is a 87 y.o. male.   Laceration  Patient presented to the ED for evaluation of a laceration to his left lower leg.  Patient was walking at the grocery store.  He was hit in his lower leg with a door.  Patient ended up sustaining a laceration.  He takes Plavix and initially the bleeding persisted but they were able to get it to stop with pressure.  He denies any other falls or injuries.  No numbness or weakness.  He is not sure about his last tetanus shot  Past Medical History:  Diagnosis Date   Anxiety    Arthritis    Atrial fibrillation, permanent ([place]) 01/07/2016   Blood transfusion    "related to a surgery"   BPH (benign prostatic hypertrophy) with urinary obstruction    s/p turp yrs ago   Bradycardia    a. Amio d/c'd 08/2013; brady arrest 08/2013 after PCI >>> recurrent AF >>> Amiodarone restarted. b. Pacemaker being considered in 11/2014.   CAD (coronary artery disease)    a. s/p MI and prior PCI of LAD;  b. LHC (08/2013):  prox LAD 60-70%, mid LAD stents ok, ostial lesion at Dx jailed by stent, mild CFX and RCA disesase >>>  PCI (09/08/13):  rotational atherectomy + Promus DES to prox LAD   Cardiomyopathy [facility])    a. Echo (08/2013):  EF 30-35%, AS hypokinesis, Gr 1 diast dysfn, mild MR, mild LAE >>> b. improved EF 50-55% by echo 8/15. c. EF down again by echo 12/2014 to 30-35% but 51% by nuc.   Chronic lower back pain    Chronic systolic CHF (congestive heart failure) (HCC)    CKD (chronic kidney disease), stage III ([place])    a. Per review of labs baseline Cr 1.1-1.3.   DDD (degenerative disc disease)    chronic back pain   Depression    Diverticulitis of colon with bleeding    s/p sigmoid resection '88   DJD (degenerative joint disease) hips and knees   s/p bilateral total  replacements   GERD (gastroesophageal reflux disease)    occ. take prevacid   Glaucoma    Hearing impaired person, right    History of GI diverticular bleed april 2012   transfused blood and resolved without surgical intervention   Hypertension    Impaired hearing bilateral    only left hearing aid   Impotence, organic    s/p penile prosthesis 1990's   Incomplete bladder emptying    LBBB (left bundle branch block)    Meningioma (HCC) right -sided w/ right VI palsy   followed by dr Gaynell Face   Mitral regurgitation    a. Mild-mod by echo 12/2014.   Myocardial infarction ([place]) 1980's- medical intervention   "so mild I didn't know I'd had it"   PAF (paroxysmal atrial fibrillation) ([place])    not on coumadin due to hx of GI bleed.   Prostate cancer ([place]) 11/30/13   Gleason 8, volume 22.14 cc   Sleep apnea    non-compliant cpap   Uses hearing aid    left    Vitreous hemorrhage (HCC)    related to branch retinal vein occlusion left eye   Wears glasses     Patient Active Problem List   Diagnosis Date Noted  Vitreous hemorrhage, left eye ([place]) 07/14/2019   Vitreous hemorrhage, left (HCC) 07/13/2019   Severe sepsis ([place]) 05/16/2019   Biventricular cardiac pacemaker in situ 04/30/2019   Atrial fibrillation, permanent ([place]) 01/07/2016   Orthostatic hypotension 02/06/2015   Weakness 01/05/2015   History of GI bleed 01/05/2015   Hypokalemia 01/05/2015   Dyspnea 11/30/2014   Fatigue 02/22/2014   Coronary artery disease involving native coronary artery of native heart without angina pectoris    Prostate cancer ([place])    GI bleed 02/09/2014   Chest pain 02/09/2014   Malignant neoplasm of prostate ([place]) 12/15/2013   Other malaise and fatigue [id number]   Chronic systolic heart failure ([place]) 10/12/2013   Left bundle branch block 10/12/2013   Angina, class III ([place]) 09/05/2013   Bradycardia    Coronary atherosclerosis of native coronary artery 09/03/2013   Mitral valve disorder  07/10/2013   Cough 07/10/2013   Nodular prostate without urinary obstruction 01/08/2011   Hypertrophy of prostate without urinary obstruction and other lower urinary tract symptoms (LUTS) 01/08/2011   Impotence of organic origin 01/08/2011    Past Surgical History:  Procedure Laterality Date   APPENDECTOMY     CARDIAC CATHETERIZATION  2007   noncritical cad (results w/ chart)   CARDIOVERSION N/A 10/13/2015   Procedure: CARDIOVERSION;  Surgeon: Josue Hector, MD;  Location: [place];  Service: Cardiovascular;  Laterality: N/A;   CATARACT EXTRACTION W/ INTRAOCULAR LENS  IMPLANT, BILATERAL Bilateral ~ [place] Right "several"   CORONARY ANGIOPLASTY WITH STENT PLACEMENT  08-03-08   drug-eluting stent x2 distal and mid lad   EP IMPLANTABLE DEVICE N/A 01/31/2015   Procedure: BiV Pacemaker Insertion CRT-P;  Surgeon: Evans Lance, MD;  Location: [place] CV LAB;  Service: Cardiovascular;  Laterality: N/A;   EP IMPLANTABLE DEVICE N/A 02/07/2015   Procedure: Lead Revision/Repair;  Surgeon: Deboraha Sprang, MD;  Location: [place] CV LAB;  Service: Cardiovascular;  Laterality: N/A;   ESOPHAGOGASTRODUODENOSCOPY N/A 02/11/2014   Procedure: ESOPHAGOGASTRODUODENOSCOPY (EGD);  Surgeon: Cleotis Nipper, MD;  Location[facility] ENDOSCOPY;  Service: Endoscopy;  Laterality: N/A;   gamma knife radiation  2000   [facility] for meningioma, last eval 2013- no change   GAS INSERTION Left 07/14/2019   Procedure: INSERTION OF GAS;  Surgeon: Hayden Pedro, MD;  Location: [place];  Service: Ophthalmology;  Laterality: Left;   INGUINAL HERNIA REPAIR Bilateral    INNER EAR SURGERY Right yrs ago   "trying to get my hearing back   LEFT HEART CATHETERIZATION WITH CORONARY ANGIOGRAM N/A 09/04/2013   Procedure: LEFT HEART CATHETERIZATION WITH CORONARY ANGIOGRAM;  Surgeon: Jettie Booze, MD;  Location: [facility] CATH LAB;  Service: Cardiovascular;  Laterality: N/A;    PARS PLANA VITRECTOMY Left 07/14/2019   27 GAUGE   PARS PLANA VITRECTOMY 27 GAUGE Left 07/14/2019   Procedure: PARS PLANA VITRECTOMY 27 GAUGE;  Surgeon: Hayden Pedro, MD;  Location: [place];  Service: Ophthalmology;  Laterality: Left;   PENILE PROSTHESIS IMPLANT  1990's   PERCUTANEOUS CORONARY ROTOBLATOR INTERVENTION (PCI-R) N/A 09/08/2013   Procedure: PERCUTANEOUS CORONARY ROTOBLATOR INTERVENTION (PCI-R);  Surgeon: Jettie Booze, MD;  Location: [facility] CATH LAB;  Service: Cardiovascular;  Laterality: N/A;   PHOTOCOAGULATION WITH LASER Left 07/14/2019   Procedure: PHOTOCOAGULATION WITH LASER;  Surgeon: Hayden Pedro, MD;  Location: [place];  Service: Ophthalmology;  Laterality: Left;   PROSTATE BIOPSY  11/30/13   Gleason 8,  vol 22.14 cc   REVISION TOTAL KNEE ARTHROPLASTY Right    SHOULDER OPEN ROTATOR CUFF REPAIR Left    TOTAL HIP ARTHROPLASTY Right 03-25-08--   TOTAL HIP ARTHROPLASTY Left 2005   TOTAL HIP REVISION Right 3-4 times   TOTAL KNEE ARTHROPLASTY Right 2004   TOTAL KNEE ARTHROPLASTY Left 1997   TRANSURETHRAL RESECTION OF PROSTATE  "years ago"   TRANSURETHRAL RESECTION OF PROSTATE  01/08/2011   Procedure: TRANSURETHRAL RESECTION OF THE PROSTATE (TURP);  Surgeon: Franchot Gallo;  Location: [place];  Service: Urology;  Laterality: N/A;  GYRUS        Family History  Problem Relation Age of Onset   Heart disease Mother    Heart attack Mother    Emphysema Father    Cancer Brother        liver cancer   Cancer Other     Social History   Tobacco Use   Smoking status: Former    Packs/day: 1.00    Years: 14.00    Pack years: 14.00    Types: Cigarettes    Quit date: 01/05/1959    Years since quitting: 61.7   Smokeless tobacco: Never  Vaping Use   Vaping Use: Never used  Substance Use Topics   Alcohol use: Yes    Comment: social    Drug use: No    Home Medications Prior to Admission medications   Medication Sig Start Date End Date Taking?  Authorizing Provider  bacitracin-polymyxin b (POLYSPORIN) ophthalmic ointment Place 1 application into the left eye 3 (three) times daily. apply to eye every 12 hours while awake 07/15/19   Hayden Pedro, MD  brimonidine (ALPHAGAN P) 0.1 % SOLN Place 1 drop into both eyes 2 (two) times daily.  08/11/15   [provider]  buPROPion (WELLBUTRIN XL) 150 MG 24 hr tablet Take 150 mg by mouth daily.    [provider]  clopidogrel (PLAVIX) 75 MG tablet TAKE 1 TABLET EVERY DAY Patient taking differently: Take 75 mg by mouth daily. 11/02/16   Jettie Booze, MD  cyclobenzaprine (FLEXERIL) 5 MG tablet Take 2.5 mg by mouth 3 (three) times daily as needed for muscle spasms.  11/20/18   [provider]  diazepam (VALIUM) 5 MG tablet Take 1 tablet (5 mg total) by mouth every 12 (twelve) hours as needed for anxiety. 01/07/16   Isaiah Serge, NP  dorzolamide-timolol (COSOPT) 22.3-6.8 MG/ML ophthalmic solution Place 1 drop into both eyes 2 (two) times daily.  11/12/13   [provider]  gatifloxacin (ZYMAXID) 0.5 % SOLN Place 1 drop into the left eye 4 (four) times daily. 07/15/19   Hayden Pedro, MD  latanoprost (XALATAN) 0.005 % ophthalmic solution Place 1 drop into the left eye at bedtime. 09/22/19   [provider]  Multiple Vitamins-Minerals (MULTIVITAMIN WITH MINERALS) tablet Take 1 tablet by mouth daily.    [provider]  nitroGLYCERIN (NITROSTAT) 0.4 MG SL tablet Place 1 tablet (0.4 mg total) under the tongue every 5 (five) minutes as needed for chest pain. 04/21/18   Evans Lance, MD  OVER THE COUNTER MEDICATION Take 1 capsule by mouth daily. IBguard    [provider]  pantoprazole (PROTONIX) 40 MG tablet Take 1 tablet by mouth daily. 03/28/19   [provider]  potassium chloride (K-DUR,KLOR-CON) 10 MEQ tablet TAKE 1 TABLET EVERY DAY AS NEEDED. WHEN YOU TAKE YOUR FUROSEMIDE. Patient taking differently: Take 10 mEq by mouth  daily. 11/02/16  Jettie Booze, MD  prednisoLONE acetate (PRED FORTE) 1 % ophthalmic suspension Place 1 drop into the left eye 4 (four) times daily. 07/15/19   Hayden Pedro, MD  Probiotic Product (PROBIOTIC PO) Take 1 tablet by mouth daily. ALIGN    [provider]  psyllium (METAMUCIL) 58.6 % packet Take 1 packet by mouth daily with supper. Mix in 8 oz liquid and drink    [provider]  tamsulosin (FLOMAX) 0.4 MG CAPS capsule Take 0.4 mg by mouth daily. 05/17/18   [provider]  torsemide (DEMADEX) 20 MG tablet Take one extra tablet of torsemide by mouth if weight gain of 3 pounds Patient taking differently: Take 20 mg by mouth daily. Take one extra tablet of torsemide by mouth if weight gain of 3 pounds 08/28/17   Evans Lance, MD  traMADol (ULTRAM) 50 MG tablet Take 50 mg by mouth every 6 (six) hours as needed for moderate pain.  03/15/15   [provider]  traZODone (DESYREL) 50 MG tablet Take 50 mg by mouth at bedtime as needed for sleep.     [provider]    Allergies    Codeine, Omeprazole, and Warfarin sodium  Review of Systems   Review of Systems  All other systems reviewed and are negative.  Physical Exam Updated Vital Signs BP (!) 126/92 (BP Location: Right Arm)   Pulse 75   Temp 97.9 F (36.6 C) (Oral)   Resp 18   Ht 1.702 m ('5\' 7"'$ )   Wt 81.6 kg   SpO2 97%   BMI 28.19 kg/m   Physical Exam Vitals and nursing note reviewed.  Constitutional:      General: He is not in acute distress.    Appearance: He is well-developed.  HENT:     Head: Normocephalic and atraumatic.     Right Ear: External ear normal.     Left Ear: External ear normal.  Eyes:     General: No scleral icterus.       Right eye: No discharge.        Left eye: No discharge.     Conjunctiva/sclera: Conjunctivae normal.  Neck:     Trachea: No tracheal deviation.  Cardiovascular:     Rate and Rhythm: Normal rate.  Pulmonary:     Effort:  Pulmonary effort is normal. No respiratory distress.     Breath sounds: No stridor.  Abdominal:     General: There is no distension.  Musculoskeletal:        General: Tenderness present. No swelling or deformity.     Cervical back: Neck supple.  Skin:    General: Skin is warm and dry.     Findings: No rash.  Neurological:     Mental Status: He is alert.     Cranial Nerves: Cranial nerve deficit: no gross deficits.    ED Results / Procedures / Treatments   Labs (all labs ordered are listed, but only abnormal results are displayed) Labs Reviewed - No data to display  EKG None  Radiology No results found.  Procedures .Marland KitchenLaceration Repair  Date/Time: 09/26/2020 12:52 PM Performed by: Dorie Rank, MD Authorized by: Dorie Rank, MD   Consent:    Consent obtained:  Verbal   Consent given by:  Patient   Risks discussed:  Infection, need for additional repair, pain, poor cosmetic result and poor wound healing   Alternatives discussed:  No treatment and delayed treatment Universal protocol:    Procedure explained  and questions answered to patient or proxy's satisfaction: yes     Relevant documents present and verified: yes     Test results available: yes     Imaging studies available: yes     Required blood products, implants, devices, and special equipment available: yes     Site/side marked: yes     Immediately prior to procedure, a time out was called: yes     Patient identity confirmed:  Verbally with patient Anesthesia:    Anesthesia method:  Local infiltration Laceration details:    Location: Left lower leg.   Length (cm):  3 Pre-procedure details:    Preparation:  Patient was prepped and draped in usual sterile fashion Exploration:    Imaging outcome: foreign body not noted     Wound exploration: entire depth of wound visualized     Wound extent: no muscle damage noted, no tendon damage noted, no underlying fracture noted and no vascular damage noted      Contaminated: no   Treatment:    Area cleansed with:  Povidone-iodine   Amount of cleaning:  Standard   Irrigation solution:  Sterile saline   Irrigation method:  Pressure wash Skin repair:    Repair method:  Sutures   Suture size:  4-0   Suture material:  Prolene   Suture technique:  Simple interrupted   Number of sutures:  5 Approximation:    Approximation:  Close Repair type:    Repair type:  Simple Post-procedure details:    Dressing:  Antibiotic ointment   Procedure completion:  Tolerated well, no immediate complications   Medications Ordered in ED Medications  lidocaine-EPINEPHrine (XYLOCAINE W/EPI) 2 %-1:200000 (PF) injection 10 mL (10 mLs Infiltration Given 09/26/20 1214)  Tdap (BOOSTRIX) injection 0.5 mL (0.5 mLs Intramuscular Given 09/26/20 1215)    ED Course  I have reviewed the triage vital signs and the nursing notes.  Pertinent labs & imaging results that were available during my care of the patient were reviewed by me and considered in my medical decision making (see chart for details).    MDM Rules/Calculators/A&P                           Patient with a laceration to the lower leg.  Sutured without difficulty.  No neurovascular injury.  Discharged home with precautions Final Clinical Impression(s) / ED Diagnoses Final diagnoses:  Laceration of left lower extremity, initial encounter    Rx / DC Orders ED Discharge Orders     None        Dorie Rank, MD 09/26/20 1253

## 2020-09-28 NOTE — Progress Notes (Signed)
No ICM remote transmission received for 09/26/2020 and next ICM transmission scheduled for 10/24/2020.

## 2020-10-03 DIAGNOSIS — C61 Malignant neoplasm of prostate: Secondary | ICD-10-CM | POA: Diagnosis not present

## 2020-10-03 DIAGNOSIS — N35011 Post-traumatic bulbous urethral stricture: Secondary | ICD-10-CM | POA: Diagnosis not present

## 2020-10-05 DIAGNOSIS — F419 Anxiety disorder, unspecified: Secondary | ICD-10-CM | POA: Diagnosis not present

## 2020-10-05 DIAGNOSIS — R1310 Dysphagia, unspecified: Secondary | ICD-10-CM | POA: Diagnosis not present

## 2020-10-05 DIAGNOSIS — M549 Dorsalgia, unspecified: Secondary | ICD-10-CM | POA: Diagnosis not present

## 2020-10-05 DIAGNOSIS — K589 Irritable bowel syndrome without diarrhea: Secondary | ICD-10-CM | POA: Diagnosis not present

## 2020-10-05 DIAGNOSIS — Z09 Encounter for follow-up examination after completed treatment for conditions other than malignant neoplasm: Secondary | ICD-10-CM | POA: Diagnosis not present

## 2020-10-10 DIAGNOSIS — H401123 Primary open-angle glaucoma, left eye, severe stage: Secondary | ICD-10-CM | POA: Diagnosis not present

## 2020-10-10 DIAGNOSIS — H43813 Vitreous degeneration, bilateral: Secondary | ICD-10-CM | POA: Diagnosis not present

## 2020-10-10 DIAGNOSIS — H353131 Nonexudative age-related macular degeneration, bilateral, early dry stage: Secondary | ICD-10-CM | POA: Diagnosis not present

## 2020-10-10 DIAGNOSIS — H348322 Tributary (branch) retinal vein occlusion, left eye, stable: Secondary | ICD-10-CM | POA: Diagnosis not present

## 2020-10-10 DIAGNOSIS — Z961 Presence of intraocular lens: Secondary | ICD-10-CM | POA: Diagnosis not present

## 2020-10-10 DIAGNOSIS — H401112 Primary open-angle glaucoma, right eye, moderate stage: Secondary | ICD-10-CM | POA: Diagnosis not present

## 2020-10-10 DIAGNOSIS — H4312 Vitreous hemorrhage, left eye: Secondary | ICD-10-CM | POA: Diagnosis not present

## 2020-10-10 DIAGNOSIS — Z0100 Encounter for examination of eyes and vision without abnormal findings: Secondary | ICD-10-CM | POA: Diagnosis not present

## 2020-10-10 DIAGNOSIS — D492 Neoplasm of unspecified behavior of bone, soft tissue, and skin: Secondary | ICD-10-CM | POA: Diagnosis not present

## 2020-10-10 DIAGNOSIS — H532 Diplopia: Secondary | ICD-10-CM | POA: Diagnosis not present

## 2020-10-17 DIAGNOSIS — N183 Chronic kidney disease, stage 3 unspecified: Secondary | ICD-10-CM | POA: Diagnosis not present

## 2020-10-17 DIAGNOSIS — I4891 Unspecified atrial fibrillation: Secondary | ICD-10-CM | POA: Diagnosis not present

## 2020-10-17 DIAGNOSIS — K219 Gastro-esophageal reflux disease without esophagitis: Secondary | ICD-10-CM | POA: Diagnosis not present

## 2020-10-17 DIAGNOSIS — E7800 Pure hypercholesterolemia, unspecified: Secondary | ICD-10-CM | POA: Diagnosis not present

## 2020-10-17 DIAGNOSIS — F332 Major depressive disorder, recurrent severe without psychotic features: Secondary | ICD-10-CM | POA: Diagnosis not present

## 2020-10-17 DIAGNOSIS — I2510 Atherosclerotic heart disease of native coronary artery without angina pectoris: Secondary | ICD-10-CM | POA: Diagnosis not present

## 2020-10-17 DIAGNOSIS — I5020 Unspecified systolic (congestive) heart failure: Secondary | ICD-10-CM | POA: Diagnosis not present

## 2020-10-17 DIAGNOSIS — Z8546 Personal history of malignant neoplasm of prostate: Secondary | ICD-10-CM | POA: Diagnosis not present

## 2020-10-17 DIAGNOSIS — I10 Essential (primary) hypertension: Secondary | ICD-10-CM | POA: Diagnosis not present

## 2020-10-18 ENCOUNTER — Other Ambulatory Visit: Payer: Self-pay | Admitting: Gastroenterology

## 2020-10-18 DIAGNOSIS — R1314 Dysphagia, pharyngoesophageal phase: Secondary | ICD-10-CM

## 2020-10-24 DIAGNOSIS — C61 Malignant neoplasm of prostate: Secondary | ICD-10-CM | POA: Diagnosis not present

## 2020-10-24 DIAGNOSIS — N35011 Post-traumatic bulbous urethral stricture: Secondary | ICD-10-CM | POA: Diagnosis not present

## 2020-10-25 ENCOUNTER — Other Ambulatory Visit: Payer: Self-pay | Admitting: Gastroenterology

## 2020-10-25 ENCOUNTER — Ambulatory Visit
Admission: RE | Admit: 2020-10-25 | Discharge: 2020-10-25 | Disposition: A | Discharge status: Home / Self Care | Payer: Medicare HMO | Source: Ambulatory Visit | Attending: Gastroenterology | Admitting: Gastroenterology

## 2020-10-25 DIAGNOSIS — R1314 Dysphagia, pharyngoesophageal phase: Secondary | ICD-10-CM

## 2020-10-25 DIAGNOSIS — R1310 Dysphagia, unspecified: Secondary | ICD-10-CM | POA: Diagnosis not present

## 2020-10-27 DIAGNOSIS — K219 Gastro-esophageal reflux disease without esophagitis: Secondary | ICD-10-CM | POA: Diagnosis not present

## 2020-10-27 DIAGNOSIS — R933 Abnormal findings on diagnostic imaging of other parts of digestive tract: Secondary | ICD-10-CM | POA: Diagnosis not present

## 2020-10-27 DIAGNOSIS — R1319 Other dysphagia: Secondary | ICD-10-CM | POA: Diagnosis not present

## 2020-10-27 DIAGNOSIS — K222 Esophageal obstruction: Secondary | ICD-10-CM | POA: Diagnosis not present

## 2020-11-01 NOTE — Progress Notes (Signed)
No ICM remote transmission received for 10/24/2020 and next ICM transmission scheduled for 11/09/2020.

## 2020-11-02 ENCOUNTER — Other Ambulatory Visit: Payer: Self-pay

## 2020-11-02 ENCOUNTER — Encounter (INDEPENDENT_AMBULATORY_CARE_PROVIDER_SITE_OTHER): Payer: Medicare HMO | Admitting: Ophthalmology

## 2020-11-02 DIAGNOSIS — H353221 Exudative age-related macular degeneration, left eye, with active choroidal neovascularization: Secondary | ICD-10-CM

## 2020-11-02 DIAGNOSIS — H34832 Tributary (branch) retinal vein occlusion, left eye, with macular edema: Secondary | ICD-10-CM | POA: Diagnosis not present

## 2020-11-02 DIAGNOSIS — N400 Enlarged prostate without lower urinary tract symptoms: Secondary | ICD-10-CM | POA: Diagnosis not present

## 2020-11-02 DIAGNOSIS — I4891 Unspecified atrial fibrillation: Secondary | ICD-10-CM | POA: Diagnosis not present

## 2020-11-02 DIAGNOSIS — I5020 Unspecified systolic (congestive) heart failure: Secondary | ICD-10-CM | POA: Diagnosis not present

## 2020-11-02 DIAGNOSIS — H43811 Vitreous degeneration, right eye: Secondary | ICD-10-CM

## 2020-11-02 DIAGNOSIS — I10 Essential (primary) hypertension: Secondary | ICD-10-CM

## 2020-11-02 DIAGNOSIS — H35033 Hypertensive retinopathy, bilateral: Secondary | ICD-10-CM | POA: Diagnosis not present

## 2020-11-02 DIAGNOSIS — F322 Major depressive disorder, single episode, severe without psychotic features: Secondary | ICD-10-CM | POA: Diagnosis not present

## 2020-11-02 DIAGNOSIS — H353112 Nonexudative age-related macular degeneration, right eye, intermediate dry stage: Secondary | ICD-10-CM | POA: Diagnosis not present

## 2020-11-02 DIAGNOSIS — I2510 Atherosclerotic heart disease of native coronary artery without angina pectoris: Secondary | ICD-10-CM | POA: Diagnosis not present

## 2020-11-02 DIAGNOSIS — N183 Chronic kidney disease, stage 3 unspecified: Secondary | ICD-10-CM | POA: Diagnosis not present

## 2020-11-02 DIAGNOSIS — E7800 Pure hypercholesterolemia, unspecified: Secondary | ICD-10-CM | POA: Diagnosis not present

## 2020-11-02 DIAGNOSIS — K219 Gastro-esophageal reflux disease without esophagitis: Secondary | ICD-10-CM | POA: Diagnosis not present

## 2020-11-09 ENCOUNTER — Telehealth: Payer: Self-pay

## 2020-11-09 ENCOUNTER — Ambulatory Visit (INDEPENDENT_AMBULATORY_CARE_PROVIDER_SITE_OTHER): Payer: Medicare HMO

## 2020-11-09 DIAGNOSIS — Z950 Presence of cardiac pacemaker: Secondary | ICD-10-CM

## 2020-11-09 DIAGNOSIS — R079 Chest pain, unspecified: Secondary | ICD-10-CM | POA: Diagnosis not present

## 2020-11-09 DIAGNOSIS — R197 Diarrhea, unspecified: Secondary | ICD-10-CM | POA: Diagnosis not present

## 2020-11-09 DIAGNOSIS — R110 Nausea: Secondary | ICD-10-CM | POA: Diagnosis not present

## 2020-11-09 DIAGNOSIS — I5022 Chronic systolic (congestive) heart failure: Secondary | ICD-10-CM | POA: Diagnosis not present

## 2020-11-09 DIAGNOSIS — R0789 Other chest pain: Secondary | ICD-10-CM | POA: Diagnosis not present

## 2020-11-09 NOTE — Telephone Encounter (Signed)
I spoke with the patient and he states he been having some upset belly problems. He agreed to send missed ICM transmission.

## 2020-11-10 ENCOUNTER — Telehealth: Payer: Self-pay | Admitting: Internal Medicine

## 2020-11-10 NOTE — Telephone Encounter (Signed)
Called patient back about his message. Patient stated that he had some chest and left arm pain, and numbness at night in left hand. Patient told the doctor he was seeing yesterday at the [facility] clinic yesterday. He was there for stomach upset. He stated they did an EKG.  Walk-in clinic told patient to follow up with his cardiologist. Patient stated his chest pain comes and goes, patient has not tried his nitro to see if that helps. Will try to call [facility] when they open today to see if they can fax EKG to our office. Informed patient that since he has a device that a message would be sent to device clinic. Looks like patient spoke to someone yesterday about sending a missed transmission.

## 2020-11-10 NOTE — Telephone Encounter (Signed)
    No significant ECG change noted on tracing from walk-in clinic and the one a few months prior.   Jettie Booze, MD

## 2020-11-10 NOTE — Telephone Encounter (Signed)
Patient notified. Follow up scheduled with Dr Irish Lack for [place].

## 2020-11-10 NOTE — Telephone Encounter (Signed)
Pt c/o of Chest Pain: 1. Are you having CP right now? No  2. Are you experiencing any other symptoms (ex. SOB, nausea, vomiting, sweating)? Dizziness SOB 3. How long have you been experiencing CP? Just started about a few days 4. Is your CP continuous or coming and going? Coming and coming  5. Have you taken Nitroglycerin? No    Patient went to [facility] walk in clinic they did a EKG and stated he need to see the doctor. Patient is having tingling in his left hand.     I spoke with Pam I she ask if I would send a message and she will call back.

## 2020-11-11 NOTE — Progress Notes (Signed)
EPIC Encounter for ICM Monitoring  Patient Name: [name] is a 87 y.o. male Date: 11/11/2020 Primary Care Physican: Lawerance Cruel, MD Primary Cardiologist: Irish Lack Electrophysiologist: Lovena Le  Last Weight: 180 lbs     Transmission reviewed.    CorVue thoracic impedance normal fluid levels.   Prescribed:  Torsemide 20 mg Take 20-40 mg by mouth daily. Take one extra tablet of torsemide by mouth if weight gain of 3 pounds Potassium 10 mEq Take 10 mEq by mouth daily.    Labs: 05/18/2019 Creatinine 1.32, BUN 19, Potassium 3.8, Sodium 136, GFR 47-55 05/17/2019 Creatinine 1.07, BUN 16, Potassium 3.7, Sodium 136, GFR >60 05/16/2019 Creatinine 1.23, BUN 16, Potassium 3.9, Sodium 138, GFR 51-60 A complete set of results can be found in Results Review.   Recommendations:  No changes.   Follow-up plan: ICM clinic phone appointment on 12/12/2020.  91 day device clinic remote transmission 11/17/2020.     EP/Cardiology Office Visits: Recalls 10/03/2019 with Dr. Irish Lack and 05/02/2020 with [name].     Copy of ICM check sent to Dr. Lovena Le.   3 month ICM trend: 11/09/2020.    1 Year ICM trend:       Rosalene Billings, RN 11/11/2020 3:11 PM

## 2020-11-17 ENCOUNTER — Ambulatory Visit (INDEPENDENT_AMBULATORY_CARE_PROVIDER_SITE_OTHER): Payer: Medicare HMO

## 2020-11-17 DIAGNOSIS — I442 Atrioventricular block, complete: Secondary | ICD-10-CM | POA: Diagnosis not present

## 2020-11-19 LAB — CUP PACEART REMOTE DEVICE CHECK
Battery Remaining Longevity: 31 mo
Battery Remaining Percentage: 33 %
Battery Voltage: 2.95 V
Date Time Interrogation Session: 20220923102400
Implantable Lead Implant Date: 20161205
Implantable Lead Implant Date: 20161205
Implantable Lead Implant Date: 20161205
Implantable Lead Location: 753858
Implantable Lead Location: 753859
Implantable Lead Location: 753860
Implantable Pulse Generator Implant Date: 20161205
Lead Channel Impedance Value: 460 Ohm
Lead Channel Impedance Value: 990 Ohm
Lead Channel Pacing Threshold Amplitude: 0.75 V
Lead Channel Pacing Threshold Amplitude: 1 V
Lead Channel Pacing Threshold Pulse Width: 0.5 ms
Lead Channel Pacing Threshold Pulse Width: 0.8 ms
Lead Channel Sensing Intrinsic Amplitude: 12 mV
Lead Channel Setting Pacing Amplitude: 1.75 V
Lead Channel Setting Pacing Amplitude: 2.5 V
Lead Channel Setting Pacing Pulse Width: 0.5 ms
Lead Channel Setting Pacing Pulse Width: 0.8 ms
Lead Channel Setting Sensing Sensitivity: 2 mV
Pulse Gen Model: 3262
Pulse Gen Serial Number: 7802901

## 2020-11-24 NOTE — Progress Notes (Signed)
Remote pacemaker transmission.   

## 2020-11-30 DIAGNOSIS — H532 Diplopia: Secondary | ICD-10-CM | POA: Diagnosis not present

## 2020-11-30 DIAGNOSIS — H401123 Primary open-angle glaucoma, left eye, severe stage: Secondary | ICD-10-CM | POA: Diagnosis not present

## 2020-11-30 DIAGNOSIS — H401112 Primary open-angle glaucoma, right eye, moderate stage: Secondary | ICD-10-CM | POA: Diagnosis not present

## 2020-12-09 ENCOUNTER — Encounter (INDEPENDENT_AMBULATORY_CARE_PROVIDER_SITE_OTHER): Payer: Medicare HMO | Admitting: Ophthalmology

## 2020-12-09 ENCOUNTER — Other Ambulatory Visit: Payer: Self-pay

## 2020-12-09 DIAGNOSIS — H43811 Vitreous degeneration, right eye: Secondary | ICD-10-CM | POA: Diagnosis not present

## 2020-12-09 DIAGNOSIS — H353221 Exudative age-related macular degeneration, left eye, with active choroidal neovascularization: Secondary | ICD-10-CM | POA: Diagnosis not present

## 2020-12-09 DIAGNOSIS — H35033 Hypertensive retinopathy, bilateral: Secondary | ICD-10-CM

## 2020-12-09 DIAGNOSIS — I10 Essential (primary) hypertension: Secondary | ICD-10-CM | POA: Diagnosis not present

## 2020-12-09 DIAGNOSIS — H34832 Tributary (branch) retinal vein occlusion, left eye, with macular edema: Secondary | ICD-10-CM

## 2020-12-09 DIAGNOSIS — H353112 Nonexudative age-related macular degeneration, right eye, intermediate dry stage: Secondary | ICD-10-CM | POA: Diagnosis not present

## 2020-12-12 ENCOUNTER — Ambulatory Visit (INDEPENDENT_AMBULATORY_CARE_PROVIDER_SITE_OTHER): Payer: Medicare HMO

## 2020-12-12 DIAGNOSIS — I5022 Chronic systolic (congestive) heart failure: Secondary | ICD-10-CM | POA: Diagnosis not present

## 2020-12-12 DIAGNOSIS — Z950 Presence of cardiac pacemaker: Secondary | ICD-10-CM | POA: Diagnosis not present

## 2020-12-15 ENCOUNTER — Telehealth: Payer: Self-pay

## 2020-12-15 NOTE — Telephone Encounter (Signed)
I spoke with the patient and he would like for me to call him in the morning to help him send missed transmission.

## 2020-12-16 NOTE — Telephone Encounter (Signed)
The patient sent his missed transmission. He would like for Margarita Grizzle to call him after she review the transmission.

## 2020-12-16 NOTE — Progress Notes (Signed)
EPIC Encounter for ICM Monitoring  Patient Name: Nathan BARRETTA is a 87 y.o. male Date: 12/16/2020 Primary Care Physican: Lawerance Cruel, MD Primary Cardiologist: Irish Lack Electrophysiologist: Lovena Le  Last Weight: 180 lbs     Spoke with patient and heart failure questions reviewed.  Pt asymptomatic for fluid accumulation but not feeling well today.  He would like to check a report every week for the next few weeks.   CorVue thoracic impedance normal fluid levels.   Prescribed:  Torsemide 20 mg Take 20 mg by mouth daily. Take one extra tablet of torsemide by mouth if weight gain of 3 pounds. Potassium 10 mEq Take 10 mEq by mouth daily.    Labs: 05/18/2019 Creatinine 1.32, BUN 19, Potassium 3.8, Sodium 136, GFR 47-55 05/17/2019 Creatinine 1.07, BUN 16, Potassium 3.7, Sodium 136, GFR >60 05/16/2019 Creatinine 1.23, BUN 16, Potassium 3.9, Sodium 138, GFR 51-60 A complete set of results can be found in Results Review.   Recommendations:  No changes and encouraged to call if experiencing any fluid symptoms.   Follow-up plan: ICM clinic phone appointment on 12/21/2020 (manual) to recheck fluid levels.  91 day device clinic remote transmission 02/16/2021.     EP/Cardiology Office Visits: 04/14/2021 with Dr. Irish Lack. Recall 05/02/2020 with [name].     Copy of ICM check sent to Dr. Lovena Le.    3 month ICM trend: 12/16/2020.    1 Year ICM trend:       Rosalene Billings, RN 12/16/2020 12:55 PM

## 2020-12-27 ENCOUNTER — Ambulatory Visit (INDEPENDENT_AMBULATORY_CARE_PROVIDER_SITE_OTHER): Payer: Medicare HMO

## 2020-12-27 ENCOUNTER — Telehealth: Payer: Self-pay

## 2020-12-27 DIAGNOSIS — I5022 Chronic systolic (congestive) heart failure: Secondary | ICD-10-CM

## 2020-12-27 DIAGNOSIS — Z950 Presence of cardiac pacemaker: Secondary | ICD-10-CM

## 2020-12-27 NOTE — Progress Notes (Signed)
EPIC Encounter for ICM Monitoring  Patient Name: [name] is a 87 y.o. male Date: 12/27/2020 Primary Care Physican: Lawerance Cruel, MD Primary Cardiologist: Irish Lack Electrophysiologist: Lovena Le  Last Weight: 180 lbs     Spoke with patient and heart failure questions reviewed.  Pt asymptomatic for fluid accumulation and feeling well.   CorVue thoracic impedance normal fluid levels.   Prescribed:  Torsemide 20 mg Take 20 mg by mouth daily. Take one extra tablet of torsemide by mouth if weight gain of 3 pounds. Potassium 10 mEq Take 10 mEq by mouth daily.    Labs: 05/18/2019 Creatinine 1.32, BUN 19, Potassium 3.8, Sodium 136, GFR 47-55 05/17/2019 Creatinine 1.07, BUN 16, Potassium 3.7, Sodium 136, GFR >60 05/16/2019 Creatinine 1.23, BUN 16, Potassium 3.9, Sodium 138, GFR 51-60 A complete set of results can be found in Results Review.   Recommendations:  No changes and encouraged to call if experiencing any fluid symptoms.   Follow-up plan: ICM clinic phone appointment on 01/23/2021.  91 day device clinic remote transmission 02/16/2021.     EP/Cardiology Office Visits: 04/14/2021 with Dr. Irish Lack. Recall 05/02/2020 with [name].     Copy of ICM check sent to Dr. Lovena Le.     3 month ICM trend: 12/26/2020.    1 Year ICM trend:       Rosalene Billings, RN 12/27/2020 3:39 PM

## 2020-12-27 NOTE — Telephone Encounter (Signed)
Remote ICM transmission received.  Attempted call to patient regarding ICM remote transmission and left detailed message per DPR.  Advised to return call for any fluid symptoms or questions. Next ICM remote transmission scheduled 01/23/2021.    

## 2021-01-02 ENCOUNTER — Telehealth: Payer: Self-pay | Admitting: *Deleted

## 2021-01-02 NOTE — Telephone Encounter (Signed)
   Pre-operative Risk Assessment    Patient Name: [name]  DOB: [birth year] MRN: [id number]     Request for Surgical Clearance   Procedure:  Dental Extraction - No. of Teeth:  1 TOOTH FOR EXTRACTION ALONG WITH BONE GRAFT  Date of Surgery: Clearance TBD                                 Surgeon:  DR. Yetta Flock Group or Practice Name:  [facility] Phone number:  [phone] Fax number:  [phone]   Type of Clearance Requested: - Medical  - Pharmacy:  Hold Clopidogrel (Plavix)     Type of Anesthesia:   Not Indicated   Additional requests/questions:   Jiles Prows   01/02/2021, 3:08 PM

## 2021-01-04 NOTE — Telephone Encounter (Signed)
I don't know if a bone graft procedure has higher bleeding risk.  That is up to the oral surgeon.  His stents are old enough that it is ok to hold PLavix 5 days prior to procedure if they recommend.

## 2021-01-04 NOTE — Telephone Encounter (Signed)
   Patient Name: [name]  DOB: 07-04-1926 MRN: [id number]  Primary Cardiologist: Larae Grooms, MD  Chart reviewed as part of pre-operative protocol coverage. Simple dental extractions (meaning 1-2 extractions) are considered low risk procedures per guidelines and generally do not require any specific cardiac clearance. It is also generally accepted that for simple extractions and dental cleanings, there is no need to interrupt blood thinner therapy. However, given bone graft will also be performed, I did discuss holding Plavix with Dr. Irish Lack. Per Dr. Irish Lack, "I don't know if a bone graft procedure has higher bleeding risk.  That is up to the oral surgeon.  His stents are old enough that it is ok to hold PLavix 5 days prior to procedure if they recommend." If Plavix is held, please restart this as soon as possible after dental procedure.   SBE prophylaxis is not required for the patient from a cardiac standpoint.  I will route this recommendation to the requesting party via Epic fax function and remove from pre-op pool.  Please call with questions.  Darreld Mclean, PA-C 01/04/2021, 4:26 PM

## 2021-01-04 NOTE — Telephone Encounter (Signed)
Hi Dr. Irish Lack. Mr. Malkin has upcoming tooth extraction and bone graft planned. He has a history of CAD with prior stenting in 2015. I know we do not usually hold Plavix for a single tooth extraction but would you recommend holding it for a bone graft?  Please route response to P CV DIV PREOP.  Thank you!

## 2021-01-17 ENCOUNTER — Encounter (INDEPENDENT_AMBULATORY_CARE_PROVIDER_SITE_OTHER): Payer: Medicare HMO | Admitting: Ophthalmology

## 2021-01-23 ENCOUNTER — Inpatient Hospital Stay (HOSPITAL_COMMUNITY)
Admission: EM | Admit: 2021-01-23 | Discharge: 2021-01-27 | DRG: 536 | Disposition: A | Discharge status: Skilled Nursing Facility | Payer: Medicare HMO | Attending: Internal Medicine | Admitting: Internal Medicine

## 2021-01-23 ENCOUNTER — Emergency Department (HOSPITAL_COMMUNITY): Payer: Medicare HMO

## 2021-01-23 ENCOUNTER — Encounter (HOSPITAL_COMMUNITY): Payer: Self-pay | Admitting: *Deleted

## 2021-01-23 DIAGNOSIS — R1310 Dysphagia, unspecified: Secondary | ICD-10-CM | POA: Diagnosis not present

## 2021-01-23 DIAGNOSIS — M810 Age-related osteoporosis without current pathological fracture: Secondary | ICD-10-CM | POA: Diagnosis not present

## 2021-01-23 DIAGNOSIS — H547 Unspecified visual loss: Secondary | ICD-10-CM | POA: Diagnosis present

## 2021-01-23 DIAGNOSIS — M9702XA Periprosthetic fracture around internal prosthetic left hip joint, initial encounter: Secondary | ICD-10-CM | POA: Diagnosis not present

## 2021-01-23 DIAGNOSIS — Z9181 History of falling: Secondary | ICD-10-CM | POA: Diagnosis not present

## 2021-01-23 DIAGNOSIS — W010XXA Fall on same level from slipping, tripping and stumbling without subsequent striking against object, initial encounter: Secondary | ICD-10-CM | POA: Diagnosis present

## 2021-01-23 DIAGNOSIS — Z96643 Presence of artificial hip joint, bilateral: Secondary | ICD-10-CM | POA: Diagnosis present

## 2021-01-23 DIAGNOSIS — Z8546 Personal history of malignant neoplasm of prostate: Secondary | ICD-10-CM

## 2021-01-23 DIAGNOSIS — D696 Thrombocytopenia, unspecified: Secondary | ICD-10-CM | POA: Diagnosis not present

## 2021-01-23 DIAGNOSIS — Z20822 Contact with and (suspected) exposure to covid-19: Secondary | ICD-10-CM | POA: Diagnosis not present

## 2021-01-23 DIAGNOSIS — Z800 Family history of malignant neoplasm of digestive organs: Secondary | ICD-10-CM | POA: Diagnosis not present

## 2021-01-23 DIAGNOSIS — I340 Nonrheumatic mitral (valve) insufficiency: Secondary | ICD-10-CM | POA: Diagnosis present

## 2021-01-23 DIAGNOSIS — I429 Cardiomyopathy, unspecified: Secondary | ICD-10-CM | POA: Diagnosis present

## 2021-01-23 DIAGNOSIS — R404 Transient alteration of awareness: Secondary | ICD-10-CM | POA: Diagnosis not present

## 2021-01-23 DIAGNOSIS — G8929 Other chronic pain: Secondary | ICD-10-CM | POA: Diagnosis present

## 2021-01-23 DIAGNOSIS — M25552 Pain in left hip: Secondary | ICD-10-CM | POA: Diagnosis not present

## 2021-01-23 DIAGNOSIS — Z923 Personal history of irradiation: Secondary | ICD-10-CM

## 2021-01-23 DIAGNOSIS — Y92009 Unspecified place in unspecified non-institutional (private) residence as the place of occurrence of the external cause: Secondary | ICD-10-CM | POA: Diagnosis not present

## 2021-01-23 DIAGNOSIS — I5042 Chronic combined systolic (congestive) and diastolic (congestive) heart failure: Secondary | ICD-10-CM | POA: Diagnosis not present

## 2021-01-23 DIAGNOSIS — Z96653 Presence of artificial knee joint, bilateral: Secondary | ICD-10-CM | POA: Diagnosis present

## 2021-01-23 DIAGNOSIS — I2510 Atherosclerotic heart disease of native coronary artery without angina pectoris: Secondary | ICD-10-CM | POA: Diagnosis not present

## 2021-01-23 DIAGNOSIS — Z96642 Presence of left artificial hip joint: Secondary | ICD-10-CM | POA: Diagnosis not present

## 2021-01-23 DIAGNOSIS — H409 Unspecified glaucoma: Secondary | ICD-10-CM | POA: Diagnosis present

## 2021-01-23 DIAGNOSIS — Z043 Encounter for examination and observation following other accident: Secondary | ICD-10-CM | POA: Diagnosis not present

## 2021-01-23 DIAGNOSIS — N1831 Chronic kidney disease, stage 3a: Secondary | ICD-10-CM | POA: Diagnosis not present

## 2021-01-23 DIAGNOSIS — S7292XA Unspecified fracture of left femur, initial encounter for closed fracture: Secondary | ICD-10-CM | POA: Diagnosis not present

## 2021-01-23 DIAGNOSIS — I10 Essential (primary) hypertension: Secondary | ICD-10-CM | POA: Diagnosis not present

## 2021-01-23 DIAGNOSIS — M545 Low back pain, unspecified: Secondary | ICD-10-CM | POA: Diagnosis present

## 2021-01-23 DIAGNOSIS — Z79899 Other long term (current) drug therapy: Secondary | ICD-10-CM

## 2021-01-23 DIAGNOSIS — M978XXA Periprosthetic fracture around other internal prosthetic joint, initial encounter: Principal | ICD-10-CM

## 2021-01-23 DIAGNOSIS — M4312 Spondylolisthesis, cervical region: Secondary | ICD-10-CM | POA: Diagnosis not present

## 2021-01-23 DIAGNOSIS — I959 Hypotension, unspecified: Secondary | ICD-10-CM | POA: Diagnosis not present

## 2021-01-23 DIAGNOSIS — K219 Gastro-esophageal reflux disease without esophagitis: Secondary | ICD-10-CM | POA: Diagnosis present

## 2021-01-23 DIAGNOSIS — M6281 Muscle weakness (generalized): Secondary | ICD-10-CM | POA: Diagnosis not present

## 2021-01-23 DIAGNOSIS — R9431 Abnormal electrocardiogram [ECG] [EKG]: Secondary | ICD-10-CM

## 2021-01-23 DIAGNOSIS — S72112A Displaced fracture of greater trochanter of left femur, initial encounter for closed fracture: Secondary | ICD-10-CM | POA: Diagnosis not present

## 2021-01-23 DIAGNOSIS — S0990XA Unspecified injury of head, initial encounter: Secondary | ICD-10-CM | POA: Diagnosis not present

## 2021-01-23 DIAGNOSIS — Z8249 Family history of ischemic heart disease and other diseases of the circulatory system: Secondary | ICD-10-CM | POA: Diagnosis not present

## 2021-01-23 DIAGNOSIS — I252 Old myocardial infarction: Secondary | ICD-10-CM | POA: Diagnosis not present

## 2021-01-23 DIAGNOSIS — Z9079 Acquired absence of other genital organ(s): Secondary | ICD-10-CM

## 2021-01-23 DIAGNOSIS — Z86011 Personal history of benign neoplasm of the brain: Secondary | ICD-10-CM | POA: Diagnosis not present

## 2021-01-23 DIAGNOSIS — F419 Anxiety disorder, unspecified: Secondary | ICD-10-CM | POA: Diagnosis present

## 2021-01-23 DIAGNOSIS — Z885 Allergy status to narcotic agent status: Secondary | ICD-10-CM

## 2021-01-23 DIAGNOSIS — Z96649 Presence of unspecified artificial hip joint: Secondary | ICD-10-CM

## 2021-01-23 DIAGNOSIS — I130 Hypertensive heart and chronic kidney disease with heart failure and stage 1 through stage 4 chronic kidney disease, or unspecified chronic kidney disease: Secondary | ICD-10-CM | POA: Diagnosis not present

## 2021-01-23 DIAGNOSIS — I5020 Unspecified systolic (congestive) heart failure: Secondary | ICD-10-CM | POA: Diagnosis not present

## 2021-01-23 DIAGNOSIS — Z825 Family history of asthma and other chronic lower respiratory diseases: Secondary | ICD-10-CM

## 2021-01-23 DIAGNOSIS — M47812 Spondylosis without myelopathy or radiculopathy, cervical region: Secondary | ICD-10-CM | POA: Diagnosis not present

## 2021-01-23 DIAGNOSIS — H9190 Unspecified hearing loss, unspecified ear: Secondary | ICD-10-CM | POA: Diagnosis present

## 2021-01-23 DIAGNOSIS — S72002A Fracture of unspecified part of neck of left femur, initial encounter for closed fracture: Secondary | ICD-10-CM | POA: Diagnosis not present

## 2021-01-23 DIAGNOSIS — Z7401 Bed confinement status: Secondary | ICD-10-CM | POA: Diagnosis not present

## 2021-01-23 DIAGNOSIS — R0902 Hypoxemia: Secondary | ICD-10-CM | POA: Diagnosis not present

## 2021-01-23 DIAGNOSIS — Z955 Presence of coronary angioplasty implant and graft: Secondary | ICD-10-CM

## 2021-01-23 DIAGNOSIS — I700 Atherosclerosis of aorta: Secondary | ICD-10-CM | POA: Diagnosis not present

## 2021-01-23 DIAGNOSIS — Z981 Arthrodesis status: Secondary | ICD-10-CM | POA: Diagnosis not present

## 2021-01-23 DIAGNOSIS — M9702XD Periprosthetic fracture around internal prosthetic left hip joint, subsequent encounter: Secondary | ICD-10-CM | POA: Diagnosis not present

## 2021-01-23 DIAGNOSIS — E860 Dehydration: Secondary | ICD-10-CM | POA: Diagnosis present

## 2021-01-23 DIAGNOSIS — S72041A Displaced fracture of base of neck of right femur, initial encounter for closed fracture: Secondary | ICD-10-CM | POA: Diagnosis not present

## 2021-01-23 DIAGNOSIS — R41841 Cognitive communication deficit: Secondary | ICD-10-CM | POA: Diagnosis not present

## 2021-01-23 DIAGNOSIS — W19XXXA Unspecified fall, initial encounter: Secondary | ICD-10-CM

## 2021-01-23 DIAGNOSIS — E7800 Pure hypercholesterolemia, unspecified: Secondary | ICD-10-CM | POA: Diagnosis not present

## 2021-01-23 DIAGNOSIS — J9811 Atelectasis: Secondary | ICD-10-CM | POA: Diagnosis not present

## 2021-01-23 DIAGNOSIS — Z950 Presence of cardiac pacemaker: Secondary | ICD-10-CM | POA: Diagnosis not present

## 2021-01-23 DIAGNOSIS — G319 Degenerative disease of nervous system, unspecified: Secondary | ICD-10-CM | POA: Diagnosis not present

## 2021-01-23 DIAGNOSIS — M1990 Unspecified osteoarthritis, unspecified site: Secondary | ICD-10-CM | POA: Diagnosis not present

## 2021-01-23 DIAGNOSIS — S199XXA Unspecified injury of neck, initial encounter: Secondary | ICD-10-CM | POA: Diagnosis not present

## 2021-01-23 DIAGNOSIS — I517 Cardiomegaly: Secondary | ICD-10-CM | POA: Diagnosis not present

## 2021-01-23 DIAGNOSIS — N400 Enlarged prostate without lower urinary tract symptoms: Secondary | ICD-10-CM | POA: Diagnosis not present

## 2021-01-23 DIAGNOSIS — S0001XA Abrasion of scalp, initial encounter: Secondary | ICD-10-CM | POA: Diagnosis present

## 2021-01-23 DIAGNOSIS — Z7902 Long term (current) use of antithrombotics/antiplatelets: Secondary | ICD-10-CM

## 2021-01-23 DIAGNOSIS — R4182 Altered mental status, unspecified: Secondary | ICD-10-CM | POA: Diagnosis not present

## 2021-01-23 DIAGNOSIS — I4821 Permanent atrial fibrillation: Secondary | ICD-10-CM | POA: Diagnosis present

## 2021-01-23 DIAGNOSIS — Z87891 Personal history of nicotine dependence: Secondary | ICD-10-CM

## 2021-01-23 DIAGNOSIS — Z888 Allergy status to other drugs, medicaments and biological substances status: Secondary | ICD-10-CM

## 2021-01-23 DIAGNOSIS — S72302A Unspecified fracture of shaft of left femur, initial encounter for closed fracture: Secondary | ICD-10-CM | POA: Diagnosis not present

## 2021-01-23 DIAGNOSIS — N35011 Post-traumatic bulbous urethral stricture: Secondary | ICD-10-CM | POA: Diagnosis not present

## 2021-01-23 DIAGNOSIS — Z91199 Patient's noncompliance with other medical treatment and regimen due to unspecified reason: Secondary | ICD-10-CM

## 2021-01-23 LAB — CBC
HCT: 40 % (ref 39.0–52.0)
Hemoglobin: 13 g/dL (ref 13.0–17.0)
MCH: 30.8 pg (ref 26.0–34.0)
MCHC: 32.5 g/dL (ref 30.0–36.0)
MCV: 94.8 fL (ref 80.0–100.0)
Platelets: 125 10*3/uL — ABNORMAL LOW (ref 150–400)
RBC: 4.22 MIL/uL (ref 4.22–5.81)
RDW: 13.6 % (ref 11.5–15.5)
WBC: 6.6 10*3/uL (ref 4.0–10.5)
nRBC: 0 % (ref 0.0–0.2)

## 2021-01-23 LAB — APTT: aPTT: 28 seconds (ref 24–36)

## 2021-01-23 LAB — I-STAT CHEM 8, ED
BUN: 15 mg/dL (ref 8–23)
Calcium, Ion: 1.1 mmol/L — ABNORMAL LOW (ref 1.15–1.40)
Chloride: 101 mmol/L (ref 98–111)
Creatinine, Ser: 1.1 mg/dL (ref 0.61–1.24)
Glucose, Bld: 147 mg/dL — ABNORMAL HIGH (ref 70–99)
HCT: 39 % (ref 39.0–52.0)
Hemoglobin: 13.3 g/dL (ref 13.0–17.0)
Potassium: 4 mmol/L (ref 3.5–5.1)
Sodium: 136 mmol/L (ref 135–145)
TCO2: 25 mmol/L (ref 22–32)

## 2021-01-23 LAB — TYPE AND SCREEN
ABO/RH(D): O POS
Antibody Screen: NEGATIVE

## 2021-01-23 LAB — PROTIME-INR
INR: 1.1 (ref 0.8–1.2)
Prothrombin Time: 14 seconds (ref 11.4–15.2)

## 2021-01-23 LAB — COMPREHENSIVE METABOLIC PANEL
ALT: 18 U/L (ref 0–44)
AST: 23 U/L (ref 15–41)
Albumin: 3.3 g/dL — ABNORMAL LOW (ref 3.5–5.0)
Alkaline Phosphatase: 84 U/L (ref 38–126)
Anion gap: 10 (ref 5–15)
BUN: 15 mg/dL (ref 8–23)
CO2: 25 mmol/L (ref 22–32)
Calcium: 8.6 mg/dL — ABNORMAL LOW (ref 8.9–10.3)
Chloride: 102 mmol/L (ref 98–111)
Creatinine, Ser: 1.14 mg/dL (ref 0.61–1.24)
GFR, Estimated: >60 mL/min (ref >60)
Glucose, Bld: 153 mg/dL — ABNORMAL HIGH (ref 70–99)
Potassium: 4 mmol/L (ref 3.5–5.1)
Sodium: 137 mmol/L (ref 135–145)
Total Bilirubin: 0.6 mg/dL (ref 0.3–1.2)
Total Protein: 5.5 g/dL — ABNORMAL LOW (ref 6.5–8.1)

## 2021-01-23 LAB — RESP PANEL BY RT-PCR (FLU A&B, COVID) ARPGX2
Influenza A by PCR: NEGATIVE
Influenza B by PCR: NEGATIVE
SARS Coronavirus 2 by RT PCR: NEGATIVE

## 2021-01-23 LAB — LACTIC ACID, PLASMA: Lactic Acid, Venous: 2.3 mmol/L (ref 0.5–1.9)

## 2021-01-23 LAB — ETHANOL: Alcohol, Ethyl (B): <10 mg/dL (ref <10)

## 2021-01-23 NOTE — ED Provider Notes (Signed)
[facility] EMERGENCY DEPARTMENT Provider Note   CSN: [id number] Arrival date & time: 01/23/21  2157     History Chief Complaint  Patient presents with   [name] is a 87 y.o. male.  He is brought in by EMS from home after a fall the bathroom.  Unclear reason for falling.  He did strike his head although denies loss of consciousness.  Complaining of severe left hip pain.  Denies any chest pain shortness of breath abdominal pain.  Denies neck pain or back pain.  No numbness or weakness.  He is on blood thinners and he is a level 2 activation  The history is provided by the patient and the EMS personnel.  Fall This is a new problem. The problem has not changed since onset.Pertinent negatives include no chest pain, no abdominal pain, no headaches and no shortness of breath. Associated symptoms comments: Left hip pain. The symptoms are aggravated by bending and twisting. Nothing relieves the symptoms. He has tried nothing for the symptoms. The treatment provided no relief.      Past Medical History:  Diagnosis Date   Anxiety    Arthritis    Atrial fibrillation, permanent ([place]) 01/07/2016   Blood transfusion    "related to a surgery"   BPH (benign prostatic hypertrophy) with urinary obstruction    s/p turp yrs ago   Bradycardia    a. Amio d/c'd 08/2013; brady arrest 08/2013 after PCI >>> recurrent AF >>> Amiodarone restarted. b. Pacemaker being considered in 11/2014.   CAD (coronary artery disease)    a. s/p MI and prior PCI of LAD;  b. LHC (08/2013):  prox LAD 60-70%, mid LAD stents ok, ostial lesion at Dx jailed by stent, mild CFX and RCA disesase >>>  PCI (09/08/13):  rotational atherectomy + Promus DES to prox LAD   Cardiomyopathy [facility])    a. Echo (08/2013):  EF 30-35%, AS hypokinesis, Gr 1 diast dysfn, mild MR, mild LAE >>> b. improved EF 50-55% by echo 8/15. c. EF down again by echo 12/2014 to 30-35% but 51% by nuc.   Chronic lower back pain     Chronic systolic CHF (congestive heart failure) (HCC)    CKD (chronic kidney disease), stage III ([place])    a. Per review of labs baseline Cr 1.1-1.3.   DDD (degenerative disc disease)    chronic back pain   Depression    Diverticulitis of colon with bleeding    s/p sigmoid resection '88   DJD (degenerative joint disease) hips and knees   s/p bilateral total replacements   GERD (gastroesophageal reflux disease)    occ. take prevacid   Glaucoma    Hearing impaired person, right    History of GI diverticular bleed april 2012   transfused blood and resolved without surgical intervention   Hypertension    Impaired hearing bilateral    only left hearing aid   Impotence, organic    s/p penile prosthesis 1990's   Incomplete bladder emptying    LBBB (left bundle branch block)    Meningioma (HCC) right -sided w/ right VI palsy   followed by dr Gaynell Face   Mitral regurgitation    a. Mild-mod by echo 12/2014.   Myocardial infarction ([place]) 1980's- medical intervention   "so mild I didn't know I'd had it"   PAF (paroxysmal atrial fibrillation) ([place])    not on coumadin due to hx of GI bleed.   Prostate cancer ([place])  11/30/13   Gleason 8, volume 22.14 cc   Sleep apnea    non-compliant cpap   Uses hearing aid    left    Vitreous hemorrhage (HCC)    related to branch retinal vein occlusion left eye   Wears glasses     Patient Active Problem List   Diagnosis Date Noted   Vitreous hemorrhage, left eye ([place]) 07/14/2019   Vitreous hemorrhage, left ([place]) 07/13/2019   Severe sepsis ([place]) 05/16/2019   Biventricular cardiac pacemaker in situ 04/30/2019   Atrial fibrillation, permanent ([place]) 01/07/2016   Orthostatic hypotension 02/06/2015   Weakness 01/05/2015   History of GI bleed 01/05/2015   Hypokalemia 01/05/2015   Dyspnea 11/30/2014   Fatigue 02/22/2014   Coronary artery disease involving native coronary artery of native heart without angina pectoris    Prostate cancer ([place])    GI  bleed 02/09/2014   Chest pain 02/09/2014   Malignant neoplasm of prostate ([place]) 12/15/2013   Other malaise and fatigue 60/73/7106   Chronic systolic heart failure ([place]) 10/12/2013   Left bundle branch block 10/12/2013   Angina, class III ([place]) 09/05/2013   Bradycardia    Coronary atherosclerosis of native coronary artery 09/03/2013   Mitral valve disorder 07/10/2013   Cough 07/10/2013   Nodular prostate without urinary obstruction 01/08/2011   Hypertrophy of prostate without urinary obstruction and other lower urinary tract symptoms (LUTS) 01/08/2011   Impotence of organic origin 01/08/2011    Past Surgical History:  Procedure Laterality Date   APPENDECTOMY     CARDIAC CATHETERIZATION  2007   noncritical cad (results w/ chart)   CARDIOVERSION N/A 10/13/2015   Procedure: CARDIOVERSION;  Surgeon: Josue Hector, MD;  Location: [place];  Service: Cardiovascular;  Laterality: N/A;   CATARACT EXTRACTION W/ INTRAOCULAR LENS  IMPLANT, BILATERAL Bilateral ~ [place] Right "several"   CORONARY ANGIOPLASTY WITH STENT PLACEMENT  08-03-08   drug-eluting stent x2 distal and mid lad   EP IMPLANTABLE DEVICE N/A 01/31/2015   Procedure: BiV Pacemaker Insertion CRT-P;  Surgeon: Evans Lance, MD;  Location: [place] CV LAB;  Service: Cardiovascular;  Laterality: N/A;   EP IMPLANTABLE DEVICE N/A 02/07/2015   Procedure: Lead Revision/Repair;  Surgeon: Deboraha Sprang, MD;  Location: [place] CV LAB;  Service: Cardiovascular;  Laterality: N/A;   ESOPHAGOGASTRODUODENOSCOPY N/A 02/11/2014   Procedure: ESOPHAGOGASTRODUODENOSCOPY (EGD);  Surgeon: Cleotis Nipper, MD;  Location: [facility] ENDOSCOPY;  Service: Endoscopy;  Laterality: N/A;   gamma knife radiation  2000   [facility] for meningioma, last eval 2013- no change   GAS INSERTION Left 07/14/2019   Procedure: INSERTION OF GAS;  Surgeon: Hayden Pedro, MD;  Location: [place];  Service: Ophthalmology;   Laterality: Left;   INGUINAL HERNIA REPAIR Bilateral    INNER EAR SURGERY Right yrs ago   "trying to get my hearing back   LEFT HEART CATHETERIZATION WITH CORONARY ANGIOGRAM N/A 09/04/2013   Procedure: LEFT HEART CATHETERIZATION WITH CORONARY ANGIOGRAM;  Surgeon: Jettie Booze, MD;  Location: [facility] CATH LAB;  Service: Cardiovascular;  Laterality: N/A;   PARS PLANA VITRECTOMY Left 07/14/2019   27 GAUGE   PARS PLANA VITRECTOMY 27 GAUGE Left 07/14/2019   Procedure: PARS PLANA VITRECTOMY 27 GAUGE;  Surgeon: Hayden Pedro, MD;  Location: [place];  Service: Ophthalmology;  Laterality: Left;   PENILE PROSTHESIS IMPLANT  1990's   PERCUTANEOUS CORONARY ROTOBLATOR INTERVENTION (PCI-R) N/A 09/08/2013  Procedure: PERCUTANEOUS CORONARY ROTOBLATOR INTERVENTION (PCI-R);  Surgeon: Jettie Booze, MD;  Location: [facility] CATH LAB;  Service: Cardiovascular;  Laterality: N/A;   PHOTOCOAGULATION WITH LASER Left 07/14/2019   Procedure: PHOTOCOAGULATION WITH LASER;  Surgeon: Hayden Pedro, MD;  Location: [place];  Service: Ophthalmology;  Laterality: Left;   PROSTATE BIOPSY  11/30/13   Gleason 8, vol 22.14 cc   REVISION TOTAL KNEE ARTHROPLASTY Right    SHOULDER OPEN ROTATOR CUFF REPAIR Left    TOTAL HIP ARTHROPLASTY Right 03-25-08--   TOTAL HIP ARTHROPLASTY Left 2005   TOTAL HIP REVISION Right 3-4 times   TOTAL KNEE ARTHROPLASTY Right 2004   TOTAL KNEE ARTHROPLASTY Left 1997   TRANSURETHRAL RESECTION OF PROSTATE  "years ago"   TRANSURETHRAL RESECTION OF PROSTATE  01/08/2011   Procedure: TRANSURETHRAL RESECTION OF THE PROSTATE (TURP);  Surgeon: Franchot Gallo;  Location: [place];  Service: Urology;  Laterality: N/A;  GYRUS        Family History  Problem Relation Age of Onset   Heart disease Mother    Heart attack Mother    Emphysema Father    Cancer Brother        liver cancer   Cancer Other     Social History   Tobacco Use   Smoking status: Former    Packs/day: 1.00     Years: 14.00    Pack years: 14.00    Types: Cigarettes    Quit date: 01/05/1959    Years since quitting: 62.0   Smokeless tobacco: Never  Vaping Use   Vaping Use: Never used  Substance Use Topics   Alcohol use: Yes    Comment: social    Drug use: No    Home Medications Prior to Admission medications   Medication Sig Start Date End Date Taking? Authorizing Provider  bacitracin-polymyxin b (POLYSPORIN) ophthalmic ointment Place 1 application into the left eye 3 (three) times daily. apply to eye every 12 hours while awake 07/15/19   Hayden Pedro, MD  brimonidine (ALPHAGAN P) 0.1 % SOLN Place 1 drop into both eyes 2 (two) times daily.  08/11/15   [provider]  buPROPion (WELLBUTRIN XL) 150 MG 24 hr tablet Take 150 mg by mouth daily.    [provider]  clopidogrel (PLAVIX) 75 MG tablet TAKE 1 TABLET EVERY DAY Patient taking differently: Take 75 mg by mouth daily. 11/02/16   Jettie Booze, MD  cyclobenzaprine (FLEXERIL) 5 MG tablet Take 2.5 mg by mouth 3 (three) times daily as needed for muscle spasms.  11/20/18   [provider]  diazepam (VALIUM) 5 MG tablet Take 1 tablet (5 mg total) by mouth every 12 (twelve) hours as needed for anxiety. 01/07/16   Isaiah Serge, NP  dorzolamide-timolol (COSOPT) 22.3-6.8 MG/ML ophthalmic solution Place 1 drop into both eyes 2 (two) times daily.  11/12/13   [provider]  gatifloxacin (ZYMAXID) 0.5 % SOLN Place 1 drop into the left eye 4 (four) times daily. 07/15/19   Hayden Pedro, MD  latanoprost (XALATAN) 0.005 % ophthalmic solution Place 1 drop into the left eye at bedtime. 09/22/19   [provider]  Multiple Vitamins-Minerals (MULTIVITAMIN WITH MINERALS) tablet Take 1 tablet by mouth daily.    [provider]  nitroGLYCERIN (NITROSTAT) 0.4 MG SL tablet Place 1 tablet (0.4 mg total) under the tongue every 5 (five) minutes as needed for chest pain. 04/21/18   Evans Lance, MD  OVER THE  COUNTER MEDICATION Take 1 capsule by mouth daily. IBguard    [provider]  pantoprazole (PROTONIX) 40 MG tablet Take 1 tablet by mouth daily. 03/28/19   [provider]  potassium chloride (K-DUR,KLOR-CON) 10 MEQ tablet TAKE 1 TABLET EVERY DAY AS NEEDED. WHEN YOU TAKE YOUR FUROSEMIDE. Patient taking differently: Take 10 mEq by mouth daily. 11/02/16   Jettie Booze, MD  prednisoLONE acetate (PRED FORTE) 1 % ophthalmic suspension Place 1 drop into the left eye 4 (four) times daily. 07/15/19   Hayden Pedro, MD  Probiotic Product (PROBIOTIC PO) Take 1 tablet by mouth daily. ALIGN    [provider]  psyllium (METAMUCIL) 58.6 % packet Take 1 packet by mouth daily with supper. Mix in 8 oz liquid and drink    [provider]  tamsulosin (FLOMAX) 0.4 MG CAPS capsule Take 0.4 mg by mouth daily. 05/17/18   [provider]  torsemide (DEMADEX) 20 MG tablet Take one extra tablet of torsemide by mouth if weight gain of 3 pounds Patient taking differently: Take 20 mg by mouth daily. Take one extra tablet of torsemide by mouth if weight gain of 3 pounds 08/28/17   Evans Lance, MD  traMADol (ULTRAM) 50 MG tablet Take 50 mg by mouth every 6 (six) hours as needed for moderate pain.  03/15/15   [provider]  traZODone (DESYREL) 50 MG tablet Take 50 mg by mouth at bedtime as needed for sleep.     [provider]    Allergies    Codeine, Omeprazole, and Warfarin sodium  Review of Systems   Review of Systems  Constitutional:  Negative for fever.  HENT:  Negative for sore throat.   Eyes:  Negative for visual disturbance.  Respiratory:  Negative for shortness of breath.   Cardiovascular:  Negative for chest pain.  Gastrointestinal:  Negative for abdominal pain.  Genitourinary:  Negative for dysuria.  Musculoskeletal:  Negative for neck pain.  Skin:  Positive for wound. Negative for rash.  Neurological:  Negative for headaches.    Physical Exam Updated Vital Signs BP 109/69   Pulse 76   Temp (!) 96.5 F (35.8 C) (Temporal)   Resp 20   Ht 5\' 8"  (1.727 m)   Wt 81.6 kg   SpO2 95%   BMI 27.37 kg/m   Physical Exam Vitals and nursing note reviewed.  Constitutional:      General: He is not in acute distress.    Appearance: Normal appearance. He is well-developed.  HENT:     Head: Normocephalic.     Comments: Abrasion of the top of his head Eyes:     Conjunctiva/sclera: Conjunctivae normal.  Cardiovascular:     Rate and Rhythm: Normal rate and regular rhythm.     Heart sounds: No murmur heard. Pulmonary:     Effort: Pulmonary effort is normal. No respiratory distress.     Breath sounds: Normal breath sounds.  Abdominal:     Palpations: Abdomen is soft.     Tenderness: There is no abdominal tenderness. There is no guarding or rebound.  Musculoskeletal:        General: Tenderness present. No swelling.     Cervical back: Neck supple.     Comments: He has tenderness of his left hip.  No shortening or rotation.  He has an abrasion below his left knee.  Distal pulses motor and sensation intact.  Upper extremities full range of motion without any pain or limitations.  Skin:    General: Skin is warm and dry.     Capillary Refill: Capillary refill takes less than 2 seconds.  Neurological:     General: No focal deficit present.     Mental Status: He is alert. He is disoriented.     Sensory: No sensory deficit.     Motor: No weakness.  Psychiatric:        Mood and Affect: Mood normal.    ED Results / Procedures / Treatments   Labs (all labs ordered are listed, but only abnormal results are displayed) Labs Reviewed  COMPREHENSIVE METABOLIC PANEL - Abnormal; Notable for the following components:      Result Value   Glucose, Bld 153 (*)    Calcium 8.6 (*)    Total Protein 5.5 (*)    Albumin 3.3 (*)    All other components within normal limits  CBC - Abnormal; Notable for the following components:    Platelets 125 (*)    All other components within normal limits  URINALYSIS, ROUTINE W REFLEX MICROSCOPIC - Abnormal; Notable for the following components:   Color, Urine STRAW (*)    All other components within normal limits  LACTIC ACID, PLASMA - Abnormal; Notable for the following components:   Lactic Acid, Venous 2.3 (*)    All other components within normal limits  LACTIC ACID, PLASMA - Abnormal; Notable for the following components:   Lactic Acid, Venous 2.4 (*)    All other components within normal limits  BASIC METABOLIC PANEL - Abnormal; Notable for the following components:   Glucose, Bld 123 (*)    Calcium 8.4 (*)    All other components within normal limits  CBC - Abnormal; Notable for the following components:   RBC 3.89 (*)    Hemoglobin 12.3 (*)    HCT 36.4 (*)    Platelets 111 (*)    All other components within normal limits  I-STAT CHEM 8, ED - Abnormal; Notable for the following components:   Glucose, Bld 147 (*)    Calcium, Ion 1.10 (*)    All other components within normal limits  RESP PANEL BY RT-PCR (FLU A&B, COVID) ARPGX2  ETHANOL  PROTIME-INR  APTT  TYPE AND SCREEN    EKG EKG Interpretation  Date/Time:  Monday January 23 2021 22:18:50 EST Ventricular Rate:  75 PR Interval:    QRS Duration: 182 QT Interval:  476 QTC Calculation: 532 R Axis:   140 Text Interpretation: Atrial fibrillation Multiple ventricular premature complexes Right bundle branch block Lateral infarct, old intermittent paced Confirmed by Aletta Edouard [id number]) on 01/23/2021 10:24:50 PM  Radiology CT ABDOMEN PELVIS WO CONTRAST  Result Date: 01/24/2021 CLINICAL DATA:  Fall EXAM: CT CHEST, ABDOMEN AND PELVIS WITHOUT CONTRAST TECHNIQUE: Multidetector CT imaging of the chest, abdomen and pelvis was performed following the standard protocol without IV contrast. COMPARISON:  None. FINDINGS: CT CHEST FINDINGS Cardiovascular: Calcific aortic atherosclerosis. Coronary artery  atherosclerotic calcification. No pericardial effusion. Mediastinum/Nodes: No mediastinal hematoma. No mediastinal, hilar or axillary lymphadenopathy. The visualized thyroid and thoracic esophageal course are unremarkable. Lungs/Pleura: No pulmonary contusion, pneumothorax or pleural effusion. The central airways are clear. Musculoskeletal: No acute fracture of the ribs, sternum or the visible portions of clavicles and scapulae. CT ABDOMEN PELVIS FINDINGS Hepatobiliary: No hepatic hematoma or laceration. No biliary dilatation. Status post cholecystectomy. Pancreas: Normal contours without ductal dilatation. No peripancreatic fluid collection. Spleen: No splenic laceration or hematoma. Adrenals/Urinary Tract: --Adrenal glands: No adrenal hemorrhage. --Right kidney/ureter: No  hydronephrosis or perinephric hematoma. --Left kidney/ureter: No hydronephrosis or perinephric hematoma. --Urinary bladder: Unremarkable. Stomach/Bowel: --Stomach/Duodenum: No hiatal hernia or other gastric abnormality. Normal duodenal course and caliber. --Small bowel: No dilatation or inflammation. --Colon: No focal abnormality. --Appendix: Normal. Vascular/Lymphatic: Atherosclerotic calcification is present within the non-aneurysmal abdominal aorta, without hemodynamically significant stenosis. No abdominal or pelvic lymphadenopathy. Reproductive: Penile prosthesis. Musculoskeletal. No pelvic fractures.  Bilateral hip prostheses. Other: None. IMPRESSION: 1. No acute abnormality of the chest, abdomen or pelvis. 2. Coronary artery and aortic Atherosclerosis (ICD10-I70.0). Electronically Signed   By: Ulyses Jarred M.D.   On: 01/24/2021 03:09   CT HEAD WO CONTRAST  Result Date: 01/24/2021 CLINICAL DATA:  Trauma EXAM: CT HEAD WITHOUT CONTRAST CT CERVICAL SPINE WITHOUT CONTRAST TECHNIQUE: Multidetector CT imaging of the head and cervical spine was performed following the standard protocol without intravenous contrast. Multiplanar CT image  reconstructions of the cervical spine were also generated. COMPARISON:  CT brain 09/09/2018, 06/21/2015 FINDINGS: CT HEAD FINDINGS Brain: No acute territorial infarction or hemorrhage is visualized. Advanced atrophy. Slight asymmetric enlargement of left convexity CSF space, suspect that there may be chronic subdural effusion. Similar slight rightward shift. The ventricles are stable in size. Vascular: No hyperdense vessels. Vertebral and carotid vascular calcification. . Skull: Redemonstrated osseous destructive process at the right clavus and cavernous sinus. No fracture Sinuses/Orbits: Trace left mastoid opacification. Mild mucosal thickening in the sinuses Other: None CT CERVICAL SPINE FINDINGS Alignment: Straightening of the cervical spine. Trace retrolisthesis C4 on C5 and C5 on C6. Facet alignment is maintained Skull base and vertebrae: No acute fracture. No primary bone lesion or focal pathologic process. Soft tissues and spinal canal: No prevertebral fluid or swelling. No visible canal hematoma. Disc levels: Advanced degenerative changes C3 through C7. Bony fusion across C5-C6. Facet degenerative changes at multiple levels with foraminal stenosis. Upper chest: Negative. Other: None IMPRESSION: 1. No definite CT evidence for acute intracranial abnormality. Suspected chronic left subdural effusion. Atrophy 2. Straightening of the cervical spine with degenerative changes. No acute osseous abnormality 3. Chronic lytic/destructive process at the right clivus and involving the right cavernous sinus Electronically Signed   By: Donavan Foil M.D.   On: 01/24/2021 01:31   CT Chest Wo Contrast  Result Date: 01/24/2021 CLINICAL DATA:  Fall EXAM: CT CHEST, ABDOMEN AND PELVIS WITHOUT CONTRAST TECHNIQUE: Multidetector CT imaging of the chest, abdomen and pelvis was performed following the standard protocol without IV contrast. COMPARISON:  None. FINDINGS: CT CHEST FINDINGS Cardiovascular: Calcific aortic  atherosclerosis. Coronary artery atherosclerotic calcification. No pericardial effusion. Mediastinum/Nodes: No mediastinal hematoma. No mediastinal, hilar or axillary lymphadenopathy. The visualized thyroid and thoracic esophageal course are unremarkable. Lungs/Pleura: No pulmonary contusion, pneumothorax or pleural effusion. The central airways are clear. Musculoskeletal: No acute fracture of the ribs, sternum or the visible portions of clavicles and scapulae. CT ABDOMEN PELVIS FINDINGS Hepatobiliary: No hepatic hematoma or laceration. No biliary dilatation. Status post cholecystectomy. Pancreas: Normal contours without ductal dilatation. No peripancreatic fluid collection. Spleen: No splenic laceration or hematoma. Adrenals/Urinary Tract: --Adrenal glands: No adrenal hemorrhage. --Right kidney/ureter: No hydronephrosis or perinephric hematoma. --Left kidney/ureter: No hydronephrosis or perinephric hematoma. --Urinary bladder: Unremarkable. Stomach/Bowel: --Stomach/Duodenum: No hiatal hernia or other gastric abnormality. Normal duodenal course and caliber. --Small bowel: No dilatation or inflammation. --Colon: No focal abnormality. --Appendix: Normal. Vascular/Lymphatic: Atherosclerotic calcification is present within the non-aneurysmal abdominal aorta, without hemodynamically significant stenosis. No abdominal or pelvic lymphadenopathy. Reproductive: Penile prosthesis. Musculoskeletal. No pelvic fractures.  Bilateral hip prostheses. Other: None.  IMPRESSION: 1. No acute abnormality of the chest, abdomen or pelvis. 2. Coronary artery and aortic Atherosclerosis (ICD10-I70.0). Electronically Signed   By: Ulyses Jarred M.D.   On: 01/24/2021 03:09   CT CERVICAL SPINE WO CONTRAST  Result Date: 01/24/2021 CLINICAL DATA:  Trauma EXAM: CT HEAD WITHOUT CONTRAST CT CERVICAL SPINE WITHOUT CONTRAST TECHNIQUE: Multidetector CT imaging of the head and cervical spine was performed following the standard protocol without  intravenous contrast. Multiplanar CT image reconstructions of the cervical spine were also generated. COMPARISON:  CT brain 09/09/2018, 06/21/2015 FINDINGS: CT HEAD FINDINGS Brain: No acute territorial infarction or hemorrhage is visualized. Advanced atrophy. Slight asymmetric enlargement of left convexity CSF space, suspect that there may be chronic subdural effusion. Similar slight rightward shift. The ventricles are stable in size. Vascular: No hyperdense vessels. Vertebral and carotid vascular calcification. . Skull: Redemonstrated osseous destructive process at the right clavus and cavernous sinus. No fracture Sinuses/Orbits: Trace left mastoid opacification. Mild mucosal thickening in the sinuses Other: None CT CERVICAL SPINE FINDINGS Alignment: Straightening of the cervical spine. Trace retrolisthesis C4 on C5 and C5 on C6. Facet alignment is maintained Skull base and vertebrae: No acute fracture. No primary bone lesion or focal pathologic process. Soft tissues and spinal canal: No prevertebral fluid or swelling. No visible canal hematoma. Disc levels: Advanced degenerative changes C3 through C7. Bony fusion across C5-C6. Facet degenerative changes at multiple levels with foraminal stenosis. Upper chest: Negative. Other: None IMPRESSION: 1. No definite CT evidence for acute intracranial abnormality. Suspected chronic left subdural effusion. Atrophy 2. Straightening of the cervical spine with degenerative changes. No acute osseous abnormality 3. Chronic lytic/destructive process at the right clivus and involving the right cavernous sinus Electronically Signed   By: Donavan Foil M.D.   On: 01/24/2021 01:31   DG Pelvis Portable  Result Date: 01/23/2021 CLINICAL DATA:  Fall. EXAM: PORTABLE PELVIS 1-2 VIEWS COMPARISON:  Pelvic radiograph dated 06/27/2009. FINDINGS: Bilateral total hip arthroplasties. The arthroplasty components appear intact and in anatomic alignment. No acute fracture or dislocation. The  bones are osteopenic. Penile implant noted. There is advanced atherosclerotic calcification of the aorta. The soft tissues are grossly unremarkable. IMPRESSION: 1. No acute fracture or dislocation. 2. Bilateral total hip arthroplasties. Electronically Signed   By: Anner Crete M.D.   On: 01/23/2021 22:25   CT Hip Left Wo Contrast  Result Date: 01/24/2021 CLINICAL DATA:  Left femoral fracture. EXAM: CT OF THE LEFT HIP WITHOUT CONTRAST TECHNIQUE: Multidetector CT imaging of the left hip was performed according to the standard protocol. Multiplanar CT image reconstructions were also generated. COMPARISON:  Left femoral series 01/23/2021. FINDINGS: Bones/Joint/Cartilage The bones are diffusely demineralized. Left hip total joint arthroplasty is again noted with an anterior proximal left femoral periprosthetic transverse fracture involving the anterior aspect of the greater trochanter extending medially and inferiorly to involve the anterior aspect of the proximal metaphysis of the bone, with the fracture fragment displaced anteriorly up to 1 cm. No displaced fracture is seen in the left pelvic ring, visualized right pelvic ring, left acetabulum and visualized lower left ilium. There are bridging osteophytes of the anterior left SI joint, slight spurring of the pubic symphysis. There is a 1.5 cm irregular sclerotic lesion in the left superior pubic ramus, which was also visible on a CT of the abdomen and pelvis of 11/02/2015. Ligaments Suboptimally assessed by CT. Muscles and Tendons No intramuscular hematoma is seen. Tendon anatomy is poorly demonstrated due to artifact from the left hip arthroplasty.  Soft tissues Penile implant apparatus incidentally noted. The visualized bladder is unremarkable. No pelvic free air or fluid or soft tissue mass are observed. Prostate is surgically absent. IMPRESSION: Acute transverse periprosthetic fracture of the anterior aspect of the proximal left femur, with anterior  distraction of the fracture fragment up to 1 cm. Additional findings discussed above. Electronically Signed   By: Telford Nab M.D.   On: 01/24/2021 03:10   DG Chest Port 1 View  Result Date: 01/23/2021 CLINICAL DATA:  Fall. EXAM: PORTABLE CHEST 1 VIEW COMPARISON:  Chest x-ray 05/16/2019. FINDINGS: Left-sided ICD is again seen. The aorta is tortuous with atherosclerotic calcifications. The heart is enlarged. There are patchy airspace opacities in the left lung base. There is atelectasis in the right lung base. There is no pleural effusion or pneumothorax. There are few ill-defined small nodular densities in the right mid lung. No acute fractures are seen. IMPRESSION: 1. Left lower lobe airspace disease worrisome for infection. 2. Stable cardiomegaly. 3. There are few ill-defined nodular densities in the right mid lung, indeterminate. Findings may be infectious or inflammatory, but other etiologies are not excluded. Recommend short-term follow-up x-ray in 4-6 weeks to re-evaluate. Chest CT can be performed as clinically warranted. Electronically Signed   By: Ronney Asters M.D.   On: 01/23/2021 22:24   DG FEMUR PORT MIN 2 VIEWS LEFT  Result Date: 01/23/2021 CLINICAL DATA:  Fall. EXAM: LEFT FEMUR PORTABLE 2 VIEWS COMPARISON:  CT abdomen and pelvis 05/16/2019. FINDINGS: The bones are diffusely osteopenic. Left hip arthroplasty appears in anatomic alignment. On the frogleg lateral view of the left hip there is an acute fracture of the proximal femur laterally fracture fragment distracted 1.4 mm. Left knee arthroplasty appears in anatomic alignment. There are extensive vascular calcifications in the soft tissues. Penile pump is present. IMPRESSION: 1. Left hip arthroplasty in anatomic alignment. There is acute fracture of the proximal left femur. 2. Left knee arthroplasty appears uncomplicated. Electronically Signed   By: Ronney Asters M.D.   On: 01/23/2021 22:28    Procedures Procedures   Medications  Ordered in ED Medications  pantoprazole (PROTONIX) EC tablet 40 mg (40 mg Oral Not Given 01/24/21 0915)  tamsulosin (FLOMAX) capsule 0.4 mg (0.4 mg Oral Not Given 01/24/21 0915)  HYDROmorphone (DILAUDID) injection 0.5 mg (has no administration in time range)  lactated ringers infusion ( Intravenous New Bag/Given 01/24/21 0735)  acetaminophen (TYLENOL) tablet 650 mg (has no administration in time range)    Or  acetaminophen (TYLENOL) suppository 650 mg (has no administration in time range)  senna-docusate (Senokot-S) tablet 1 tablet (has no administration in time range)  traZODone (DESYREL) tablet 50 mg (has no administration in time range)  dorzolamide-timolol (COSOPT) 22.3-6.8 MG/ML ophthalmic solution 1 drop (0 drops Both Eyes Hold 01/24/21 0914)  brimonidine (ALPHAGAN) 0.15 % ophthalmic solution 1 drop (0 drops Both Eyes Hold 01/24/21 0914)  fentaNYL (SUBLIMAZE) injection 50 mcg (50 mcg Intravenous Given 01/24/21 0039)  fentaNYL (SUBLIMAZE) injection 50 mcg (50 mcg Intravenous Given 01/24/21 0341)    ED Course  I have reviewed the triage vital signs and the nursing notes.  Pertinent labs & imaging results that were available during my care of the patient were reviewed by me and considered in my medical decision making (see chart for details).    MDM Rules/Calculators/A&P                          This patient complains of  fall left hip pain abrasion head fall on blood thinners level 2 trauma activation; this involves an extensive number of treatment Options and is a complaint that carries with it a high risk of complications and Morbidity. The differential includes intracranial injury, skull fracture, cervical fracture, pneumothorax, pelvis fracture, hip fracture  I ordered, reviewed and interpreted labs, which included CBC with normal white count normal hemoglobin, chemistries fairly normal other than elevated glucose, lactate elevated need to be trended, COVID-negative flu negative  urinalysis negative alcohol negative. I ordered medication IV pain medication I ordered imaging studies which included CT head and C-spine chest x-ray pelvis x-ray left femur x-ray and I independently    visualized and interpreted imaging which showed no acute findings. Additional history obtained from EMS Previous records obtained and reviewed in epic no recent admissions   After the interventions stated above, I reevaluated the patient and found patient to be symptomatically tender in his left hip.  His care is signed out to oncoming provider Dr. Sedonia Small to follow-up on imaging and likely will need more dedicated imaging of his hip and pelvis.   Final Clinical Impression(s) / ED Diagnoses Final diagnoses:  Fall  Periprosthetic fracture of hip, initial encounter    Rx / DC Orders ED Discharge Orders     None        Hayden Rasmussen, MD 01/24/21 [phone]

## 2021-01-23 NOTE — ED Notes (Signed)
Patient transported to CT 

## 2021-01-23 NOTE — ED Triage Notes (Signed)
PT fell while getting out of bathtub and landed on L hip.  No loc.  Possible deformity.  Takes Plavix.  Given 500 ns en-route after fentanyl given.

## 2021-01-24 ENCOUNTER — Other Ambulatory Visit: Payer: Self-pay

## 2021-01-24 ENCOUNTER — Encounter (HOSPITAL_COMMUNITY): Payer: Self-pay | Admitting: Family Medicine

## 2021-01-24 ENCOUNTER — Emergency Department (HOSPITAL_COMMUNITY): Payer: Medicare HMO

## 2021-01-24 DIAGNOSIS — I340 Nonrheumatic mitral (valve) insufficiency: Secondary | ICD-10-CM | POA: Diagnosis present

## 2021-01-24 DIAGNOSIS — F419 Anxiety disorder, unspecified: Secondary | ICD-10-CM | POA: Diagnosis present

## 2021-01-24 DIAGNOSIS — Z950 Presence of cardiac pacemaker: Secondary | ICD-10-CM

## 2021-01-24 DIAGNOSIS — Z825 Family history of asthma and other chronic lower respiratory diseases: Secondary | ICD-10-CM | POA: Diagnosis not present

## 2021-01-24 DIAGNOSIS — W010XXA Fall on same level from slipping, tripping and stumbling without subsequent striking against object, initial encounter: Secondary | ICD-10-CM | POA: Diagnosis present

## 2021-01-24 DIAGNOSIS — I5020 Unspecified systolic (congestive) heart failure: Secondary | ICD-10-CM | POA: Diagnosis not present

## 2021-01-24 DIAGNOSIS — I2510 Atherosclerotic heart disease of native coronary artery without angina pectoris: Secondary | ICD-10-CM | POA: Diagnosis present

## 2021-01-24 DIAGNOSIS — Z8249 Family history of ischemic heart disease and other diseases of the circulatory system: Secondary | ICD-10-CM | POA: Diagnosis not present

## 2021-01-24 DIAGNOSIS — Z043 Encounter for examination and observation following other accident: Secondary | ICD-10-CM | POA: Diagnosis not present

## 2021-01-24 DIAGNOSIS — E7800 Pure hypercholesterolemia, unspecified: Secondary | ICD-10-CM | POA: Diagnosis not present

## 2021-01-24 DIAGNOSIS — I5042 Chronic combined systolic (congestive) and diastolic (congestive) heart failure: Secondary | ICD-10-CM | POA: Diagnosis present

## 2021-01-24 DIAGNOSIS — N400 Enlarged prostate without lower urinary tract symptoms: Secondary | ICD-10-CM | POA: Diagnosis not present

## 2021-01-24 DIAGNOSIS — H547 Unspecified visual loss: Secondary | ICD-10-CM | POA: Diagnosis present

## 2021-01-24 DIAGNOSIS — I4821 Permanent atrial fibrillation: Secondary | ICD-10-CM

## 2021-01-24 DIAGNOSIS — S7292XA Unspecified fracture of left femur, initial encounter for closed fracture: Secondary | ICD-10-CM

## 2021-01-24 DIAGNOSIS — Z96642 Presence of left artificial hip joint: Secondary | ICD-10-CM | POA: Diagnosis not present

## 2021-01-24 DIAGNOSIS — I130 Hypertensive heart and chronic kidney disease with heart failure and stage 1 through stage 4 chronic kidney disease, or unspecified chronic kidney disease: Secondary | ICD-10-CM | POA: Diagnosis present

## 2021-01-24 DIAGNOSIS — S72002A Fracture of unspecified part of neck of left femur, initial encounter for closed fracture: Secondary | ICD-10-CM | POA: Diagnosis present

## 2021-01-24 DIAGNOSIS — I10 Essential (primary) hypertension: Secondary | ICD-10-CM | POA: Diagnosis not present

## 2021-01-24 DIAGNOSIS — R9431 Abnormal electrocardiogram [ECG] [EKG]: Secondary | ICD-10-CM

## 2021-01-24 DIAGNOSIS — M1990 Unspecified osteoarthritis, unspecified site: Secondary | ICD-10-CM | POA: Diagnosis not present

## 2021-01-24 DIAGNOSIS — Z86011 Personal history of benign neoplasm of the brain: Secondary | ICD-10-CM | POA: Diagnosis not present

## 2021-01-24 DIAGNOSIS — Z96643 Presence of artificial hip joint, bilateral: Secondary | ICD-10-CM | POA: Diagnosis present

## 2021-01-24 DIAGNOSIS — Z20822 Contact with and (suspected) exposure to covid-19: Secondary | ICD-10-CM | POA: Diagnosis present

## 2021-01-24 DIAGNOSIS — M9702XA Periprosthetic fracture around internal prosthetic left hip joint, initial encounter: Secondary | ICD-10-CM | POA: Diagnosis not present

## 2021-01-24 DIAGNOSIS — N1831 Chronic kidney disease, stage 3a: Secondary | ICD-10-CM | POA: Diagnosis present

## 2021-01-24 DIAGNOSIS — K219 Gastro-esophageal reflux disease without esophagitis: Secondary | ICD-10-CM | POA: Diagnosis not present

## 2021-01-24 DIAGNOSIS — E860 Dehydration: Secondary | ICD-10-CM | POA: Diagnosis present

## 2021-01-24 DIAGNOSIS — Z800 Family history of malignant neoplasm of digestive organs: Secondary | ICD-10-CM | POA: Diagnosis not present

## 2021-01-24 DIAGNOSIS — M810 Age-related osteoporosis without current pathological fracture: Secondary | ICD-10-CM | POA: Diagnosis not present

## 2021-01-24 DIAGNOSIS — Z8546 Personal history of malignant neoplasm of prostate: Secondary | ICD-10-CM | POA: Diagnosis not present

## 2021-01-24 DIAGNOSIS — I252 Old myocardial infarction: Secondary | ICD-10-CM | POA: Diagnosis not present

## 2021-01-24 DIAGNOSIS — G8929 Other chronic pain: Secondary | ICD-10-CM | POA: Diagnosis present

## 2021-01-24 DIAGNOSIS — I700 Atherosclerosis of aorta: Secondary | ICD-10-CM | POA: Diagnosis not present

## 2021-01-24 DIAGNOSIS — S72112A Displaced fracture of greater trochanter of left femur, initial encounter for closed fracture: Secondary | ICD-10-CM | POA: Diagnosis not present

## 2021-01-24 DIAGNOSIS — I429 Cardiomyopathy, unspecified: Secondary | ICD-10-CM | POA: Diagnosis present

## 2021-01-24 DIAGNOSIS — Z96653 Presence of artificial knee joint, bilateral: Secondary | ICD-10-CM | POA: Diagnosis present

## 2021-01-24 DIAGNOSIS — D696 Thrombocytopenia, unspecified: Secondary | ICD-10-CM | POA: Diagnosis present

## 2021-01-24 DIAGNOSIS — H9190 Unspecified hearing loss, unspecified ear: Secondary | ICD-10-CM | POA: Diagnosis present

## 2021-01-24 DIAGNOSIS — Y92009 Unspecified place in unspecified non-institutional (private) residence as the place of occurrence of the external cause: Secondary | ICD-10-CM | POA: Diagnosis not present

## 2021-01-24 LAB — SURGICAL PCR SCREEN
MRSA, PCR: NEGATIVE
Staphylococcus aureus: NEGATIVE

## 2021-01-24 LAB — CBC
HCT: 36.4 % — ABNORMAL LOW (ref 39.0–52.0)
Hemoglobin: 12.3 g/dL — ABNORMAL LOW (ref 13.0–17.0)
MCH: 31.6 pg (ref 26.0–34.0)
MCHC: 33.8 g/dL (ref 30.0–36.0)
MCV: 93.6 fL (ref 80.0–100.0)
Platelets: 111 10*3/uL — ABNORMAL LOW (ref 150–400)
RBC: 3.89 MIL/uL — ABNORMAL LOW (ref 4.22–5.81)
RDW: 13.6 % (ref 11.5–15.5)
WBC: 7.7 10*3/uL (ref 4.0–10.5)
nRBC: 0 % (ref 0.0–0.2)

## 2021-01-24 LAB — BASIC METABOLIC PANEL
Anion gap: 8 (ref 5–15)
BUN: 13 mg/dL (ref 8–23)
CO2: 24 mmol/L (ref 22–32)
Calcium: 8.4 mg/dL — ABNORMAL LOW (ref 8.9–10.3)
Chloride: 104 mmol/L (ref 98–111)
Creatinine, Ser: 1.04 mg/dL (ref 0.61–1.24)
GFR, Estimated: >60 mL/min (ref >60)
Glucose, Bld: 123 mg/dL — ABNORMAL HIGH (ref 70–99)
Potassium: 3.6 mmol/L (ref 3.5–5.1)
Sodium: 136 mmol/L (ref 135–145)

## 2021-01-24 LAB — URINALYSIS, ROUTINE W REFLEX MICROSCOPIC
Bilirubin Urine: NEGATIVE
Glucose, UA: NEGATIVE mg/dL
Hgb urine dipstick: NEGATIVE
Ketones, ur: NEGATIVE mg/dL
Leukocytes,Ua: NEGATIVE
Nitrite: NEGATIVE
Protein, ur: NEGATIVE mg/dL
Specific Gravity, Urine: 1.005 (ref 1.005–1.030)
pH: 5 (ref 5.0–8.0)

## 2021-01-24 LAB — LACTIC ACID, PLASMA: Lactic Acid, Venous: 2.4 mmol/L (ref 0.5–1.9)

## 2021-01-24 MED ORDER — CLOPIDOGREL BISULFATE 75 MG PO TABS
75.0000 mg | ORAL_TABLET | Freq: Every day | ORAL | Status: DC
  Administered 2021-01-25 – 2021-01-27 (×3): 75 mg via ORAL
  Filled 2021-01-24 (×3): qty 1

## 2021-01-24 MED ORDER — OXYCODONE HCL 5 MG PO TABS
5.0000 mg | ORAL_TABLET | Freq: Four times a day (QID) | ORAL | Status: DC | PRN
  Administered 2021-01-24 – 2021-01-27 (×5): 5 mg via ORAL
  Filled 2021-01-24 (×4): qty 1

## 2021-01-24 MED ORDER — FENTANYL CITRATE PF 50 MCG/ML IJ SOSY
PREFILLED_SYRINGE | INTRAMUSCULAR | Status: AC
  Filled 2021-01-24: qty 1

## 2021-01-24 MED ORDER — PANTOPRAZOLE SODIUM 40 MG PO TBEC
40.0000 mg | DELAYED_RELEASE_TABLET | Freq: Every day | ORAL | Status: DC
  Administered 2021-01-25 – 2021-01-27 (×3): 40 mg via ORAL
  Filled 2021-01-24 (×3): qty 1

## 2021-01-24 MED ORDER — ACETAMINOPHEN 325 MG PO TABS
650.0000 mg | ORAL_TABLET | Freq: Four times a day (QID) | ORAL | Status: DC | PRN
  Filled 2021-01-24: qty 2

## 2021-01-24 MED ORDER — TAMSULOSIN HCL 0.4 MG PO CAPS
0.4000 mg | ORAL_CAPSULE | Freq: Every day | ORAL | Status: DC
  Administered 2021-01-25 – 2021-01-27 (×3): 0.4 mg via ORAL
  Filled 2021-01-24 (×3): qty 1

## 2021-01-24 MED ORDER — ACETAMINOPHEN 650 MG RE SUPP: 650.0000 mg | Freq: Four times a day (QID) | RECTAL | Status: DC | PRN

## 2021-01-24 MED ORDER — DORZOLAMIDE HCL-TIMOLOL MAL 2-0.5 % OP SOLN
1.0000 [drp] | Freq: Two times a day (BID) | OPHTHALMIC | Status: DC
  Administered 2021-01-24 – 2021-01-27 (×5): 1 [drp] via OPHTHALMIC
  Filled 2021-01-24 (×2): qty 10

## 2021-01-24 MED ORDER — ENOXAPARIN SODIUM 40 MG/0.4ML IJ SOSY
40.0000 mg | PREFILLED_SYRINGE | INTRAMUSCULAR | Status: DC
  Administered 2021-01-24 – 2021-01-26 (×3): 40 mg via SUBCUTANEOUS
  Filled 2021-01-24 (×2): qty 0.4

## 2021-01-24 MED ORDER — POLYETHYLENE GLYCOL 3350 17 G PO PACK
17.0000 g | PACK | Freq: Every day | ORAL | Status: DC
  Administered 2021-01-24 – 2021-01-27 (×4): 17 g via ORAL
  Filled 2021-01-24 (×3): qty 1

## 2021-01-24 MED ORDER — HYDROMORPHONE HCL 1 MG/ML IJ SOLN
INTRAMUSCULAR | Status: AC
  Filled 2021-01-24: qty 1

## 2021-01-24 MED ORDER — ACETAMINOPHEN 500 MG PO TABS
1000.0000 mg | ORAL_TABLET | Freq: Three times a day (TID) | ORAL | Status: DC
  Administered 2021-01-24 – 2021-01-27 (×9): 1000 mg via ORAL
  Filled 2021-01-24 (×8): qty 2

## 2021-01-24 MED ORDER — FENTANYL CITRATE PF 50 MCG/ML IJ SOSY
50.0000 ug | PREFILLED_SYRINGE | Freq: Once | INTRAMUSCULAR | Status: AC
  Administered 2021-01-24: 50 ug via INTRAVENOUS

## 2021-01-24 MED ORDER — TRAZODONE HCL 50 MG PO TABS
50.0000 mg | ORAL_TABLET | Freq: Every evening | ORAL | Status: DC | PRN
  Administered 2021-01-24 – 2021-01-26 (×3): 50 mg via ORAL
  Filled 2021-01-24 (×3): qty 1

## 2021-01-24 MED ORDER — BRIMONIDINE TARTRATE 0.15 % OP SOLN
1.0000 [drp] | Freq: Two times a day (BID) | OPHTHALMIC | Status: DC
  Administered 2021-01-24 – 2021-01-27 (×5): 1 [drp] via OPHTHALMIC
  Filled 2021-01-24 (×2): qty 5

## 2021-01-24 MED ORDER — SENNOSIDES-DOCUSATE SODIUM 8.6-50 MG PO TABS: 1.0000 | ORAL_TABLET | Freq: Every evening | ORAL | Status: DC | PRN

## 2021-01-24 MED ORDER — FENTANYL CITRATE (PF) 100 MCG/2ML IJ SOLN
INTRAMUSCULAR | Status: AC
  Filled 2021-01-24: qty 2

## 2021-01-24 MED ORDER — HYDROMORPHONE HCL 1 MG/ML IJ SOLN
0.5000 mg | INTRAMUSCULAR | Status: DC | PRN
  Administered 2021-01-24: 0.5 mg via INTRAVENOUS

## 2021-01-24 MED ORDER — ENOXAPARIN SODIUM 40 MG/0.4ML IJ SOSY
PREFILLED_SYRINGE | INTRAMUSCULAR | Status: AC
  Filled 2021-01-24: qty 0.4

## 2021-01-24 MED ORDER — LACTATED RINGERS IV SOLN: INTRAVENOUS | Status: DC

## 2021-01-24 MED ORDER — MUPIROCIN 2 % EX OINT
1.0000 "application " | TOPICAL_OINTMENT | Freq: Two times a day (BID) | CUTANEOUS | Status: DC
  Administered 2021-01-25 – 2021-01-27 (×5): 1 via NASAL
  Filled 2021-01-24 (×2): qty 22

## 2021-01-24 NOTE — Consult Note (Signed)
Reason for Consult:Left hip fx Referring Physician: Norina Buzzard Time called: 0730 Time at bedside: Cypress is an 87 y.o. male.  HPI: Khadir was going to the bathroom at home and fell. He had immediate pain in his left hip and could not get up even with the help of his son and caretaker. He was brought to the ED where x-rays showed a left periprosthetic hip fx and orthopedic surgery was consulted.   Past Medical History:  Diagnosis Date   Anxiety    Arthritis    Atrial fibrillation, permanent ([place]) 01/07/2016   Blood transfusion    "related to a surgery"   BPH (benign prostatic hypertrophy) with urinary obstruction    s/p turp yrs ago   Bradycardia    a. Amio d/c'd 08/2013; brady arrest 08/2013 after PCI >>> recurrent AF >>> Amiodarone restarted. b. Pacemaker being considered in 11/2014.   CAD (coronary artery disease)    a. s/p MI and prior PCI of LAD;  b. LHC (08/2013):  prox LAD 60-70%, mid LAD stents ok, ostial lesion at Dx jailed by stent, mild CFX and RCA disesase >>>  PCI (09/08/13):  rotational atherectomy + Promus DES to prox LAD   Cardiomyopathy [facility])    a. Echo (08/2013):  EF 30-35%, AS hypokinesis, Gr 1 diast dysfn, mild MR, mild LAE >>> b. improved EF 50-55% by echo 8/15. c. EF down again by echo 12/2014 to 30-35% but 51% by nuc.   Chronic lower back pain    Chronic systolic CHF (congestive heart failure) (HCC)    CKD (chronic kidney disease), stage III ([place])    a. Per review of labs baseline Cr 1.1-1.3.   DDD (degenerative disc disease)    chronic back pain   Depression    Diverticulitis of colon with bleeding    s/p sigmoid resection '88   DJD (degenerative joint disease) hips and knees   s/p bilateral total replacements   GERD (gastroesophageal reflux disease)    occ. take prevacid   Glaucoma    Hearing impaired person, right    History of GI diverticular bleed april 2012   transfused blood and resolved without surgical intervention   Hypertension     Impaired hearing bilateral    only left hearing aid   Impotence, organic    s/p penile prosthesis 1990's   Incomplete bladder emptying    LBBB (left bundle branch block)    Meningioma (HCC) right -sided w/ right VI palsy   followed by dr Gaynell Face   Mitral regurgitation    a. Mild-mod by echo 12/2014.   Myocardial infarction ([place]) 1980's- medical intervention   "so mild I didn't know I'd had it"   PAF (paroxysmal atrial fibrillation) ([place])    not on coumadin due to hx of GI bleed.   Prostate cancer ([place]) 11/30/13   Gleason 8, volume 22.14 cc   Sleep apnea    non-compliant cpap   Uses hearing aid    left    Vitreous hemorrhage (HCC)    related to branch retinal vein occlusion left eye   Wears glasses     Past Surgical History:  Procedure Laterality Date   APPENDECTOMY     CARDIAC CATHETERIZATION  2007   noncritical cad (results w/ chart)   CARDIOVERSION N/A 10/13/2015   Procedure: CARDIOVERSION;  Surgeon: Josue Hector, MD;  Location: [facility];  Service: Cardiovascular;  Laterality: N/A;   CATARACT EXTRACTION W/ INTRAOCULAR LENS  IMPLANT, BILATERAL  Bilateral ~ 2000   CHOLECYSTECTOMY     CLOSED REDUCTION HIP DISLOCATION Right "several"   CORONARY ANGIOPLASTY WITH STENT PLACEMENT  08-03-08   drug-eluting stent x2 distal and mid lad   EP IMPLANTABLE DEVICE N/A 01/31/2015   Procedure: BiV Pacemaker Insertion CRT-P;  Surgeon: Evans Lance, MD;  Location: [facility];  Service: Cardiovascular;  Laterality: N/A;   EP IMPLANTABLE DEVICE N/A 02/07/2015   Procedure: Lead Revision/Repair;  Surgeon: Deboraha Sprang, MD;  Location: [facility];  Service: Cardiovascular;  Laterality: N/A;   ESOPHAGOGASTRODUODENOSCOPY N/A 02/11/2014   Procedure: ESOPHAGOGASTRODUODENOSCOPY (EGD);  Surgeon: Cleotis Nipper, MD;  Location: [facility] ENDOSCOPY;  Service: Endoscopy;  Laterality: N/A;   gamma knife radiation  2000   [facility] for meningioma, last eval 2013- no change   GAS INSERTION  Left 07/14/2019   Procedure: INSERTION OF GAS;  Surgeon: Hayden Pedro, MD;  Location: [facility];  Service: Ophthalmology;  Laterality: Left;   INGUINAL HERNIA REPAIR Bilateral    INNER EAR SURGERY Right yrs ago   "trying to get my hearing back   LEFT HEART CATHETERIZATION WITH CORONARY ANGIOGRAM N/A 09/04/2013   Procedure: LEFT HEART CATHETERIZATION WITH CORONARY ANGIOGRAM;  Surgeon: Jettie Booze, MD;  Location: [facility] CATH LAB;  Service: Cardiovascular;  Laterality: N/A;   PARS PLANA VITRECTOMY Left 07/14/2019   27 GAUGE   PARS PLANA VITRECTOMY 27 GAUGE Left 07/14/2019   Procedure: PARS PLANA VITRECTOMY 27 GAUGE;  Surgeon: Hayden Pedro, MD;  Location: [facility];  Service: Ophthalmology;  Laterality: Left;   PENILE PROSTHESIS IMPLANT  1990's   PERCUTANEOUS CORONARY ROTOBLATOR INTERVENTION (PCI-R) N/A 09/08/2013   Procedure: PERCUTANEOUS CORONARY ROTOBLATOR INTERVENTION (PCI-R);  Surgeon: Jettie Booze, MD;  [facility];  Service: Cardiovascular;  Laterality: N/A;   PHOTOCOAGULATION WITH LASER Left 07/14/2019   Procedure: PHOTOCOAGULATION WITH LASER;  Surgeon: Hayden Pedro, MD;  Location: [facility];  Service: Ophthalmology;  Laterality: Left;   PROSTATE BIOPSY  11/30/13   Gleason 8, vol 22.14 cc   REVISION TOTAL KNEE ARTHROPLASTY Right    SHOULDER OPEN ROTATOR CUFF REPAIR Left    TOTAL HIP ARTHROPLASTY Right 03-25-08--   TOTAL HIP ARTHROPLASTY Left 2005   TOTAL HIP REVISION Right 3-4 times   TOTAL KNEE ARTHROPLASTY Right 2004   TOTAL KNEE ARTHROPLASTY Left 1997   TRANSURETHRAL RESECTION OF PROSTATE  "years ago"   TRANSURETHRAL RESECTION OF PROSTATE  01/08/2011   Procedure: TRANSURETHRAL RESECTION OF THE PROSTATE (TURP);  Surgeon: Franchot Gallo;  Location: [facility];  Service: Urology;  Laterality: N/A;  GYRUS     Family History  Problem Relation Age of Onset   Heart disease Mother    Heart attack Mother    Emphysema Father    Cancer Brother         liver cancer   Cancer Other     Social History:  reports that he quit smoking about 62 years ago. His smoking use included cigarettes. He has a 14.00 pack-year smoking history. He has never used smokeless tobacco. He reports current alcohol use. He reports that he does not use drugs.  Allergies:  Allergies  Allergen Reactions   Gabapentin     Other reaction(s): mood changes   Torsemide    Codeine Anxiety and Other (See Comments)    Insomnia/ hyper   Omeprazole Nausea And Vomiting and Other (See Comments)    GI upset, insomnia   Warfarin Sodium  Anxiety and Other (See Comments)    Hx GI bleed    Medications: I have reviewed the patient's current medications.  Results for orders placed or performed during the hospital encounter of 01/23/21 (from the past 48 hour(s))  Resp Panel by RT-PCR (Flu A&B, Covid) Nasopharyngeal Swab     Status: None   Collection Time: 01/23/21 10:00 PM   Specimen: Nasopharyngeal Swab; Nasopharyngeal(NP) swabs in vial transport medium  Result Value Ref Range   SARS Coronavirus 2 by RT PCR NEGATIVE NEGATIVE    Comment: (NOTE) SARS-CoV-2 target nucleic acids are NOT DETECTED.  The SARS-CoV-2 RNA is generally detectable in upper respiratory specimens during the acute phase of infection. The lowest concentration of SARS-CoV-2 viral copies this assay can detect is 138 copies/mL. A negative result does not preclude SARS-Cov-2 infection and should not be used as the sole basis for treatment or other patient management decisions. A negative result may occur with  improper specimen collection/handling, submission of specimen other than nasopharyngeal swab, presence of viral mutation(s) within the areas targeted by this assay, and inadequate number of viral copies(<138 copies/mL). A negative result must be combined with clinical observations, patient history, and epidemiological information. The expected result is Negative.  Fact Sheet for Patients:   EntrepreneurPulse.com.au  Fact Sheet for Healthcare Providers:  IncredibleEmployment.be  This test is no t yet approved or cleared by the [place] FDA and  has been authorized for detection and/or diagnosis of SARS-CoV-2 by FDA under an Emergency Use Authorization (EUA). This EUA will remain  in effect (meaning this test can be used) for the duration of the COVID-19 declaration under Section 564(b)(1) of the Act, 21 U.S.C.section 360bbb-3(b)(1), unless the authorization is terminated  or revoked sooner.       Influenza A by PCR NEGATIVE NEGATIVE   Influenza B by PCR NEGATIVE NEGATIVE    Comment: (NOTE) The Xpert Xpress SARS-CoV-2/FLU/RSV plus assay is intended as an aid in the diagnosis of influenza from Nasopharyngeal swab specimens and should not be used as a sole basis for treatment. Nasal washings and aspirates are unacceptable for Xpert Xpress SARS-CoV-2/FLU/RSV testing.  Fact Sheet for Patients: EntrepreneurPulse.com.au  Fact Sheet for Healthcare Providers: IncredibleEmployment.be  This test is not yet approved or cleared by the [place] FDA and has been authorized for detection and/or diagnosis of SARS-CoV-2 by FDA under an Emergency Use Authorization (EUA). This EUA will remain in effect (meaning this test can be used) for the duration of the COVID-19 declaration under Section 564(b)(1) of the Act, 21 U.S.C. section 360bbb-3(b)(1), unless the authorization is terminated or revoked.  Performed at [facility], [place] [address] [place], [place] [address]   Comprehensive metabolic panel     Status: Abnormal   Collection Time: 01/23/21 10:00 PM  Result Value Ref Range   Sodium 137 135 - 145 mmol/L   Potassium 4.0 3.5 - 5.1 mmol/L   Chloride 102 98 - 111 mmol/L   CO2 25 22 - 32 mmol/L   Glucose, Bld 153 (H) 70 - 99 mg/dL    Comment: Glucose reference range applies only to samples  taken after fasting for at least 8 hours.   BUN 15 8 - 23 mg/dL   Creatinine, Ser 1.14 0.61 - 1.24 mg/dL   Calcium 8.6 (L) 8.9 - 10.3 mg/dL   Total Protein 5.5 (L) 6.5 - 8.1 g/dL   Albumin 3.3 (L) 3.5 - 5.0 g/dL   AST 23 15 - 41 U/L  ALT 18 0 - 44 U/L   Alkaline Phosphatase 84 38 - 126 U/L   Total Bilirubin 0.6 0.3 - 1.2 mg/dL   GFR, Estimated >60 >60 mL/min    Comment: (NOTE) Calculated using the CKD-EPI Creatinine Equation (2021)    Anion gap 10 5 - 15    Comment: Performed at [facility] [address] [place], [place] [address]  CBC     Status: Abnormal   Collection Time: 01/23/21 10:00 PM  Result Value Ref Range   WBC 6.6 4.0 - 10.5 K/uL   RBC 4.22 4.22 - 5.81 MIL/uL   Hemoglobin 13.0 13.0 - 17.0 g/dL   HCT 40.0 39.0 - 52.0 %   MCV 94.8 80.0 - 100.0 fL   MCH 30.8 26.0 - 34.0 pg   MCHC 32.5 30.0 - 36.0 g/dL   RDW 13.6 11.5 - 15.5 %   Platelets 125 (L) 150 - 400 K/uL    Comment: CRITICAL VALUE NOTED.  VALUE IS CONSISTENT WITH PREVIOUSLY REPORTED AND CALLED VALUE. REPEATED TO VERIFY    nRBC 0.0 0.0 - 0.2 %    Comment: Performed at [facility], [place] [address] [place], [place] [address]  Ethanol     Status: None   Collection Time: 01/23/21 10:00 PM  Result Value Ref Range   Alcohol, Ethyl (B) <10 <10 mg/dL    Comment: (NOTE) Lowest detectable limit for serum alcohol is 10 mg/dL.  For medical purposes only. Performed at [facility], [place] [address] [place], [place] [address]   Urinalysis, Routine w reflex microscopic Urine, Clean Catch     Status: Abnormal   Collection Time: 01/23/21 10:00 PM  Result Value Ref Range   Color, Urine STRAW (A) YELLOW   APPearance CLEAR CLEAR   Specific Gravity, Urine 1.005 1.005 - 1.030   pH 5.0 5.0 - 8.0   Glucose, UA NEGATIVE NEGATIVE mg/dL   Hgb urine dipstick NEGATIVE NEGATIVE   Bilirubin Urine NEGATIVE NEGATIVE   Ketones, ur NEGATIVE NEGATIVE mg/dL   Protein, ur NEGATIVE NEGATIVE mg/dL   Nitrite  NEGATIVE NEGATIVE   Leukocytes,Ua NEGATIVE NEGATIVE    Comment: Performed at [facility] [address], [place], [place] [address]  Lactic acid, plasma     Status: Abnormal   Collection Time: 01/23/21 10:00 PM  Result Value Ref Range   Lactic Acid, Venous 2.3 (HH) 0.5 - 1.9 mmol/L    Comment: CRITICAL RESULT CALLED TO, READ BACK BY AND VERIFIED WITH: Arville Lime RN 01/23/21 [place] Performed at [facility], [place] [address], [place], [place] [address]   Protime-INR     Status: None   Collection Time: 01/23/21 10:00 PM  Result Value Ref Range   Prothrombin Time 14.0 11.4 - 15.2 seconds   INR 1.1 0.8 - 1.2    Comment: (NOTE) INR goal varies based on device and disease states. Performed at [facility], [place] [address] [place], [place] [address]   APTT     Status: None   Collection Time: 01/23/21 10:00 PM  Result Value Ref Range   aPTT 28 24 - 36 seconds    Comment: Performed at [facility] [address] [place], [place] [address]  Type and screen [place]     Status: None   Collection Time: 01/23/21 10:15 PM  Result Value Ref Range   ABO/RH(D) O POS    Antibody Screen NEG  Sample Expiration      01/26/2021,2359 Performed at [facility], [place] [address], [place], [place] [address]   I-Stat Chem 8, ED     Status: Abnormal   Collection Time: 01/23/21 10:25 PM  Result Value Ref Range   Sodium 136 135 - 145 mmol/L   Potassium 4.0 3.5 - 5.1 mmol/L   Chloride 101 98 - 111 mmol/L   BUN 15 8 - 23 mg/dL   Creatinine, Ser 1.10 0.61 - 1.24 mg/dL   Glucose, Bld 147 (H) 70 - 99 mg/dL    Comment: Glucose reference range applies only to samples taken after fasting for at least 8 hours.   Calcium, Ion 1.10 (L) 1.15 - 1.40 mmol/L   TCO2 25 22 - 32 mmol/L   Hemoglobin 13.3 13.0 - 17.0 g/dL   HCT 39.0 39.0 - 52.0 %  Lactic acid, plasma     Status: Abnormal   Collection Time: 01/24/21  7:30 AM  Result Value Ref Range    Lactic Acid, Venous 2.4 (HH) 0.5 - 1.9 mmol/L    Comment: CRITICAL VALUE NOTED.  VALUE IS CONSISTENT WITH PREVIOUSLY REPORTED AND CALLED VALUE. Performed at [facility], [place] [address] [place] [address], [place], [place] [address]   Basic metabolic panel     Status: Abnormal   Collection Time: 01/24/21  7:30 AM  Result Value Ref Range   Sodium 136 135 - 145 mmol/L   Potassium 3.6 3.5 - 5.1 mmol/L   Chloride 104 98 - 111 mmol/L   CO2 24 22 - 32 mmol/L   Glucose, Bld 123 (H) 70 - 99 mg/dL    Comment: Glucose reference range applies only to samples taken after fasting for at least 8 hours.   BUN 13 8 - 23 mg/dL   Creatinine, Ser 1.04 0.61 - 1.24 mg/dL   Calcium 8.4 (L) 8.9 - 10.3 mg/dL   GFR, Estimated >60 >60 mL/min    Comment: (NOTE) Calculated using the CKD-EPI Creatinine Equation (2021)    Anion gap 8 5 - 15    Comment: Performed at [facility] [address], [place], [place] [address]  CBC     Status: Abnormal   Collection Time: 01/24/21  7:30 AM  Result Value Ref Range   WBC 7.7 4.0 - 10.5 K/uL   RBC 3.89 (L) 4.22 - 5.81 MIL/uL   Hemoglobin 12.3 (L) 13.0 - 17.0 g/dL   HCT 36.4 (L) 39.0 - 52.0 %   MCV 93.6 80.0 - 100.0 fL   MCH 31.6 26.0 - 34.0 pg   MCHC 33.8 30.0 - 36.0 g/dL   RDW 13.6 11.5 - 15.5 %   Platelets 111 (L) 150 - 400 K/uL    Comment: Immature Platelet Fraction may be clinically indicated, consider ordering this additional test [id number] REPEATED TO VERIFY PLATELET COUNT CONFIRMED BY SMEAR    nRBC 0.0 0.0 - 0.2 %    Comment: Performed at [facility], [place] [address] [place], [place] [address]    CT ABDOMEN PELVIS WO CONTRAST  Result Date: 01/24/2021 CLINICAL DATA:  Fall EXAM: CT CHEST, ABDOMEN AND PELVIS WITHOUT CONTRAST TECHNIQUE: Multidetector CT imaging of the chest, abdomen and pelvis was performed following the standard protocol without IV contrast. COMPARISON:  None. FINDINGS: CT CHEST FINDINGS Cardiovascular: Calcific aortic atherosclerosis.  Coronary artery atherosclerotic calcification. No pericardial effusion. Mediastinum/Nodes: No mediastinal hematoma. No mediastinal, hilar or axillary lymphadenopathy. The visualized thyroid and thoracic esophageal course are unremarkable. Lungs/Pleura:  No pulmonary contusion, pneumothorax or pleural effusion. The central airways are clear. Musculoskeletal: No acute fracture of the ribs, sternum or the visible portions of clavicles and scapulae. CT ABDOMEN PELVIS FINDINGS Hepatobiliary: No hepatic hematoma or laceration. No biliary dilatation. Status post cholecystectomy. Pancreas: Normal contours without ductal dilatation. No peripancreatic fluid collection. Spleen: No splenic laceration or hematoma. Adrenals/Urinary Tract: --Adrenal glands: No adrenal hemorrhage. --Right kidney/ureter: No hydronephrosis or perinephric hematoma. --Left kidney/ureter: No hydronephrosis or perinephric hematoma. --Urinary bladder: Unremarkable. Stomach/Bowel: --Stomach/Duodenum: No hiatal hernia or other gastric abnormality. Normal duodenal course and caliber. --Small bowel: No dilatation or inflammation. --Colon: No focal abnormality. --Appendix: Normal. Vascular/Lymphatic: Atherosclerotic calcification is present within the non-aneurysmal abdominal aorta, without hemodynamically significant stenosis. No abdominal or pelvic lymphadenopathy. Reproductive: Penile prosthesis. Musculoskeletal. No pelvic fractures.  Bilateral hip prostheses. Other: None. IMPRESSION: 1. No acute abnormality of the chest, abdomen or pelvis. 2. Coronary artery and aortic Atherosclerosis (ICD10-I70.0). Electronically Signed   By: Ulyses Jarred M.D.   On: 01/24/2021 03:09   CT HEAD WO CONTRAST  Result Date: 01/24/2021 CLINICAL DATA:  Trauma EXAM: CT HEAD WITHOUT CONTRAST CT CERVICAL SPINE WITHOUT CONTRAST TECHNIQUE: Multidetector CT imaging of the head and cervical spine was performed following the standard protocol without intravenous contrast.  Multiplanar CT image reconstructions of the cervical spine were also generated. COMPARISON:  CT brain 09/09/2018, 06/21/2015 FINDINGS: CT HEAD FINDINGS Brain: No acute territorial infarction or hemorrhage is visualized. Advanced atrophy. Slight asymmetric enlargement of left convexity CSF space, suspect that there may be chronic subdural effusion. Similar slight rightward shift. The ventricles are stable in size. Vascular: No hyperdense vessels. Vertebral and carotid vascular calcification. . Skull: Redemonstrated osseous destructive process at the right clavus and cavernous sinus. No fracture Sinuses/Orbits: Trace left mastoid opacification. Mild mucosal thickening in the sinuses Other: None CT CERVICAL SPINE FINDINGS Alignment: Straightening of the cervical spine. Trace retrolisthesis C4 on C5 and C5 on C6. Facet alignment is maintained Skull base and vertebrae: No acute fracture. No primary bone lesion or focal pathologic process. Soft tissues and spinal canal: No prevertebral fluid or swelling. No visible canal hematoma. Disc levels: Advanced degenerative changes C3 through C7. Bony fusion across C5-C6. Facet degenerative changes at multiple levels with foraminal stenosis. Upper chest: Negative. Other: None IMPRESSION: 1. No definite CT evidence for acute intracranial abnormality. Suspected chronic left subdural effusion. Atrophy 2. Straightening of the cervical spine with degenerative changes. No acute osseous abnormality 3. Chronic lytic/destructive process at the right clivus and involving the right cavernous sinus Electronically Signed   By: Donavan Foil M.D.   On: 01/24/2021 01:31   CT Chest Wo Contrast  Result Date: 01/24/2021 CLINICAL DATA:  Fall EXAM: CT CHEST, ABDOMEN AND PELVIS WITHOUT CONTRAST TECHNIQUE: Multidetector CT imaging of the chest, abdomen and pelvis was performed following the standard protocol without IV contrast. COMPARISON:  None. FINDINGS: CT CHEST FINDINGS Cardiovascular:  Calcific aortic atherosclerosis. Coronary artery atherosclerotic calcification. No pericardial effusion. Mediastinum/Nodes: No mediastinal hematoma. No mediastinal, hilar or axillary lymphadenopathy. The visualized thyroid and thoracic esophageal course are unremarkable. Lungs/Pleura: No pulmonary contusion, pneumothorax or pleural effusion. The central airways are clear. Musculoskeletal: No acute fracture of the ribs, sternum or the visible portions of clavicles and scapulae. CT ABDOMEN PELVIS FINDINGS Hepatobiliary: No hepatic hematoma or laceration. No biliary dilatation. Status post cholecystectomy. Pancreas: Normal contours without ductal dilatation. No peripancreatic fluid collection. Spleen: No splenic laceration or hematoma. Adrenals/Urinary Tract: --Adrenal glands: No adrenal hemorrhage. --Right kidney/ureter: No hydronephrosis or  perinephric hematoma. --Left kidney/ureter: No hydronephrosis or perinephric hematoma. --Urinary bladder: Unremarkable. Stomach/Bowel: --Stomach/Duodenum: No hiatal hernia or other gastric abnormality. Normal duodenal course and caliber. --Small bowel: No dilatation or inflammation. --Colon: No focal abnormality. --Appendix: Normal. Vascular/Lymphatic: Atherosclerotic calcification is present within the non-aneurysmal abdominal aorta, without hemodynamically significant stenosis. No abdominal or pelvic lymphadenopathy. Reproductive: Penile prosthesis. Musculoskeletal. No pelvic fractures.  Bilateral hip prostheses. Other: None. IMPRESSION: 1. No acute abnormality of the chest, abdomen or pelvis. 2. Coronary artery and aortic Atherosclerosis (ICD10-I70.0). Electronically Signed   By: Ulyses Jarred M.D.   On: 01/24/2021 03:09   CT CERVICAL SPINE WO CONTRAST  Result Date: 01/24/2021 CLINICAL DATA:  Trauma EXAM: CT HEAD WITHOUT CONTRAST CT CERVICAL SPINE WITHOUT CONTRAST TECHNIQUE: Multidetector CT imaging of the head and cervical spine was performed following the standard  protocol without intravenous contrast. Multiplanar CT image reconstructions of the cervical spine were also generated. COMPARISON:  CT brain 09/09/2018, 06/21/2015 FINDINGS: CT HEAD FINDINGS Brain: No acute territorial infarction or hemorrhage is visualized. Advanced atrophy. Slight asymmetric enlargement of left convexity CSF space, suspect that there may be chronic subdural effusion. Similar slight rightward shift. The ventricles are stable in size. Vascular: No hyperdense vessels. Vertebral and carotid vascular calcification. . Skull: Redemonstrated osseous destructive process at the right clavus and cavernous sinus. No fracture Sinuses/Orbits: Trace left mastoid opacification. Mild mucosal thickening in the sinuses Other: None CT CERVICAL SPINE FINDINGS Alignment: Straightening of the cervical spine. Trace retrolisthesis C4 on C5 and C5 on C6. Facet alignment is maintained Skull base and vertebrae: No acute fracture. No primary bone lesion or focal pathologic process. Soft tissues and spinal canal: No prevertebral fluid or swelling. No visible canal hematoma. Disc levels: Advanced degenerative changes C3 through C7. Bony fusion across C5-C6. Facet degenerative changes at multiple levels with foraminal stenosis. Upper chest: Negative. Other: None IMPRESSION: 1. No definite CT evidence for acute intracranial abnormality. Suspected chronic left subdural effusion. Atrophy 2. Straightening of the cervical spine with degenerative changes. No acute osseous abnormality 3. Chronic lytic/destructive process at the right clivus and involving the right cavernous sinus Electronically Signed   By: Donavan Foil M.D.   On: 01/24/2021 01:31   DG Pelvis Portable  Result Date: 01/23/2021 CLINICAL DATA:  Fall. EXAM: PORTABLE PELVIS 1-2 VIEWS COMPARISON:  Pelvic radiograph dated 06/27/2009. FINDINGS: Bilateral total hip arthroplasties. The arthroplasty components appear intact and in anatomic alignment. No acute fracture or  dislocation. The bones are osteopenic. Penile implant noted. There is advanced atherosclerotic calcification of the aorta. The soft tissues are grossly unremarkable. IMPRESSION: 1. No acute fracture or dislocation. 2. Bilateral total hip arthroplasties. Electronically Signed   By: Anner Crete M.D.   On: 01/23/2021 22:25   CT Hip Left Wo Contrast  Result Date: 01/24/2021 CLINICAL DATA:  Left femoral fracture. EXAM: CT OF THE LEFT HIP WITHOUT CONTRAST TECHNIQUE: Multidetector CT imaging of the left hip was performed according to the standard protocol. Multiplanar CT image reconstructions were also generated. COMPARISON:  Left femoral series 01/23/2021. FINDINGS: Bones/Joint/Cartilage The bones are diffusely demineralized. Left hip total joint arthroplasty is again noted with an anterior proximal left femoral periprosthetic transverse fracture involving the anterior aspect of the greater trochanter extending medially and inferiorly to involve the anterior aspect of the proximal metaphysis of the bone, with the fracture fragment displaced anteriorly up to 1 cm. No displaced fracture is seen in the left pelvic ring, visualized right pelvic ring, left acetabulum and visualized lower left ilium.  There are bridging osteophytes of the anterior left SI joint, slight spurring of the pubic symphysis. There is a 1.5 cm irregular sclerotic lesion in the left superior pubic ramus, which was also visible on a CT of the abdomen and pelvis of 11/02/2015. Ligaments Suboptimally assessed by CT. Muscles and Tendons No intramuscular hematoma is seen. Tendon anatomy is poorly demonstrated due to artifact from the left hip arthroplasty. Soft tissues Penile implant apparatus incidentally noted. The visualized bladder is unremarkable. No pelvic free air or fluid or soft tissue mass are observed. Prostate is surgically absent. IMPRESSION: Acute transverse periprosthetic fracture of the anterior aspect of the proximal left femur,  with anterior distraction of the fracture fragment up to 1 cm. Additional findings discussed above. Electronically Signed   By: Telford Nab M.D.   On: 01/24/2021 03:10   DG Chest Port 1 View  Result Date: 01/23/2021 CLINICAL DATA:  Fall. EXAM: PORTABLE CHEST 1 VIEW COMPARISON:  Chest x-ray 05/16/2019. FINDINGS: Left-sided ICD is again seen. The aorta is tortuous with atherosclerotic calcifications. The heart is enlarged. There are patchy airspace opacities in the left lung base. There is atelectasis in the right lung base. There is no pleural effusion or pneumothorax. There are few ill-defined small nodular densities in the right mid lung. No acute fractures are seen. IMPRESSION: 1. Left lower lobe airspace disease worrisome for infection. 2. Stable cardiomegaly. 3. There are few ill-defined nodular densities in the right mid lung, indeterminate. Findings may be infectious or inflammatory, but other etiologies are not excluded. Recommend short-term follow-up x-ray in 4-6 weeks to re-evaluate. Chest CT can be performed as clinically warranted. Electronically Signed   By: Ronney Asters M.D.   On: 01/23/2021 22:24   DG FEMUR PORT MIN 2 VIEWS LEFT  Result Date: 01/23/2021 CLINICAL DATA:  Fall. EXAM: LEFT FEMUR PORTABLE 2 VIEWS COMPARISON:  CT abdomen and pelvis 05/16/2019. FINDINGS: The bones are diffusely osteopenic. Left hip arthroplasty appears in anatomic alignment. On the frogleg lateral view of the left hip there is an acute fracture of the proximal femur laterally fracture fragment distracted 1.4 mm. Left knee arthroplasty appears in anatomic alignment. There are extensive vascular calcifications in the soft tissues. Penile pump is present. IMPRESSION: 1. Left hip arthroplasty in anatomic alignment. There is acute fracture of the proximal left femur. 2. Left knee arthroplasty appears uncomplicated. Electronically Signed   By: Ronney Asters M.D.   On: 01/23/2021 22:28    Review of Systems  HENT:   Negative for ear discharge, ear pain, hearing loss and tinnitus.   Eyes:  Negative for photophobia and pain.  Respiratory:  Negative for cough and shortness of breath.   Cardiovascular:  Negative for chest pain.  Gastrointestinal:  Negative for abdominal pain, nausea and vomiting.  Genitourinary:  Negative for dysuria, flank pain, frequency and urgency.  Musculoskeletal:  Positive for arthralgias (Left hip). Negative for back pain, myalgias and neck pain.  Neurological:  Negative for dizziness and headaches.  Hematological:  Does not bruise/bleed easily.  Psychiatric/Behavioral:  The patient is not nervous/anxious.   Blood pressure 120/67, pulse 75, temperature (!) 96.5 F (35.8 C), temperature source Temporal, resp. rate 16, height 5\' 8"  (1.727 m), weight 81.6 kg, SpO2 96 %. Physical Exam Constitutional:      General: He is not in acute distress.    Appearance: He is well-developed. He is not diaphoretic.  HENT:     Head: Normocephalic and atraumatic.  Eyes:     General: No  scleral icterus.       Right eye: No discharge.        Left eye: No discharge.     Conjunctiva/sclera: Conjunctivae normal.  Cardiovascular:     Rate and Rhythm: Normal rate and regular rhythm.  Pulmonary:     Effort: Pulmonary effort is normal. No respiratory distress.  Musculoskeletal:     Cervical back: Normal range of motion.     Comments: LLE No traumatic wounds, ecchymosis, or rash  Mild TTP hip  No knee or ankle effusion  Knee stable to varus/ valgus and anterior/posterior stress  Sens DPN, SPN, TN intact  Motor EHL, ext, flex, evers 5/5  DP 2+, PT 1+, No significant edema  Skin:    General: Skin is warm and dry.  Neurological:     Mental Status: He is alert.  Psychiatric:        Mood and Affect: Mood normal.        Behavior: Behavior normal.    Assessment/Plan: Left hip fx -- may be WBAT LLE. F/u with Dr. Ronnie Derby (regular orthopedist) in 3-4 weeks.    Lisette Abu, PA-C Orthopedic  Surgery [phone] 01/24/2021, 10:27 AM

## 2021-01-24 NOTE — ED Provider Notes (Signed)
  Provider Note MRN:  [id number]  Arrival date & time: 01/24/21    ED Course and Medical Decision Making  Assumed care from Dr. Melina Copa at shift change.  Fall, on blood thinners, awaiting CT head and neck.  On my assessment patient having continued significant pain to the left hip.  He is unable to bear weight.  Suspect occult fracture.  Will obtain CT imaging.  CT imaging confirms periprosthetic fracture, will consult orthopedics and admit to medicine.  Procedures  Final Clinical Impressions(s) / ED Diagnoses     ICD-10-CM   1. Periprosthetic fracture of hip, initial encounter  M97.8XXA    Z96.649     2. Fall  W19.Merril Abbe DG Chest Port 1 View    DG Chest Port 1 View    DG Pelvis Portable    DG Pelvis Portable    DG FEMUR PORT MIN 2 VIEWS LEFT    DG FEMUR PORT MIN 2 VIEWS LEFT    CANCELED: DG Pelvis Portable    CANCELED: DG Pelvis Portable      ED Discharge Orders     None       Discharge Instructions   None     Barth Kirks. Sedonia Small, Elida [email or web address]    Maudie Flakes, MD 01/24/21 [phone]

## 2021-01-24 NOTE — Assessment & Plan Note (Signed)
No angina - Resume home Plavix

## 2021-01-24 NOTE — TOC CAGE-AID Note (Signed)
Transition of Care [facility]) - CAGE-AID Screening   Patient Details  Name: [name] MRN: [id number] Date of Birth: 08-14-1926  [facility]) CM/SW Contact:    Alonia Dibuono C Tarpley-Carter, [place] Phone Number: 01/24/2021, 9:27 AM   Clinical Narrative: Pt is unable to participate in Cage Aid.  Pt is not appropriate for assessment.  Carla Rashad Tarpley-Carter, MSW, LCSW-A Pronouns:  She/Her/Hers [facility]itions of Care Clinical Social Worker Direct Number:  [phone] [email or web address]  CAGE-AID Screening: Substance Abuse Screening unable to be completed due to: : Patient unable to participate (Pt is not appropriate for assessment.)             Substance Abuse Education Offered: No

## 2021-01-24 NOTE — ED Notes (Addendum)
Handoff given to Charge Nurse K. Munnett at this time.

## 2021-01-24 NOTE — Progress Notes (Signed)
   01/24/21 1630  Clinical Encounter Type  Visited With Patient  Visit Type Initial;Psychological support;Spiritual support  Referral From Nurse  Consult/Referral To Chaplain   Chaplain Jorene Guest responded to the consult request for prayer. Chaplain Enriqueta Augusta sat and actively listened as the patient spoke of his health concerns. Ike Bene offered emotional support. The patient engaged in storytelling about his life and explored his spiritual practices and religious beliefs. Ike Bene provided a compassionate touch holding the chaplain's hand and tearfully requested prayer. This note was prepared by Jeanine Luz, M.Div..  For questions please contact by phone [phone].

## 2021-01-24 NOTE — Progress Notes (Signed)
Orthopedic Tech Progress Note Patient Details:  [name] 01-09-1927 [id number]  Patient ID: [name], male   DOB: Feb 12, 1927, 87 y.o.   MRN: [id number] I attended trauma page. Karolee Stamps 01/24/2021, 1:11 AM

## 2021-01-24 NOTE — Assessment & Plan Note (Signed)
Creatinine baseline

## 2021-01-24 NOTE — Assessment & Plan Note (Signed)
>>  ASSESSMENT AND PLAN FOR ATRIAL FIBRILLATION, PERMANENT (HCC) WRITTEN ON 01/24/2021  3:25 PM BY DANFORD, CHRISTOPHER P, MD  Heart rate controlled on Plavix only at baseline. - Resume Plavix

## 2021-01-24 NOTE — Evaluation (Signed)
Physical Therapy Evaluation Patient Details Name: [name] MRN: [id number] DOB: 02/03/1927 Today's Date: 01/24/2021  History of Present Illness  [name] is a 87 y.o. male with medical history significant for A-fib, HFpEF, CAD, BPH, OA with DJD and bilateral hip replacements, prostate cancer who presents by EMS after a fall at home. Found to have L periprosthetic hip fracture and cleared by ortho for WBAT.  Clinical Impression  Patient presents with decreased mobility due to pain in L hip limiting ROM, strength and tolerance to weight bearing with decreased balance and high risk for falls.  Patient able to stand and move a few "steps" with limited L weight tolerance so "shimmy" steps with R.  Positive LOB with PT and assist to lower to floor.  Patient previously able to ambulate with rollator on his own at home.  Feel he will need STSNF level rehab at d/c.  PT to continue to follow acutely.      Recommendations for follow up therapy are one component of a multi-disciplinary discharge planning process, led by the attending physician.  Recommendations may be updated based on patient status, additional functional criteria and insurance authorization.  Follow Up Recommendations Skilled nursing-short term rehab (<3 hours/day)    Assistance Recommended at Discharge Frequent or constant Supervision/Assistance  Functional Status Assessment Patient has had a recent decline in their functional status and/or demonstrates limited ability to make significant improvements in function in a reasonable and predictable amount of time  Equipment Recommendations  None recommended by PT    Recommendations for Other Services       Precautions / Restrictions Precautions Precautions: Fall Restrictions Weight Bearing Restrictions: Yes LLE Weight Bearing: Weight bearing as tolerated      Mobility  Bed Mobility Overal bed mobility: Needs Assistance Bed Mobility: Supine to Sit;Sit to Supine      Supine to sit: Mod assist Sit to supine: Max assist;+2 for physical assistance   General bed mobility comments: to sitting edge of stretcher in ED with A to slowly bring legs off bed and lift trunk; to supine assist for legs and trunk as perched on very edge of tall stretcher    Transfers Overall transfer level: Needs assistance Equipment used: Rolling walker (2 wheels) Transfers: Sit to/from Stand Sit to Stand: Mod assist;From elevated surface           General transfer comment: up to stand to walker from higher stretcher with A for balance and some lifting help    Ambulation/Gait Ambulation/Gait assistance: Mod assist;+2 safety/equipment Gait Distance (Feet): 2 Feet Assistive device: Rolling walker (2 wheels) Gait Pattern/deviations: Step-to pattern;Shuffle;Decreased step length - right;Decreased weight shift to left;Antalgic       General Gait Details: able to step back toward stretcher with RW and +2 for safety with "shimmy" steps on R due to pain with L weight bearing using RW and then side steps up toward HOB x 2 prior to too much pain  Stairs            Wheelchair Mobility    Modified Rankin (Stroke Patients Only)       Balance Overall balance assessment: Needs assistance Sitting-balance support: Feet unsupported Sitting balance-Leahy Scale: Poor Sitting balance - Comments: sitting edge of stretcher with feet dangling initially min A for balance, then pt able to sit with UE support and s.   Standing balance support: Bilateral upper extremity supported;Reliant on assistive device for balance Standing balance-Leahy Scale: Poor Standing balance comment: UE support and  min A at least for standing balance                             Pertinent Vitals/Pain Pain Assessment: Faces Faces Pain Scale: Hurts whole lot Pain Location: L hip with attempts at weight bearing and with moving supine to sit Pain Descriptors / Indicators:  Grimacing;Guarding;Moaning;Aching Pain Intervention(s): Monitored during session;Repositioned;Limited activity within patient's tolerance;Patient requesting pain meds-RN notified;Ice applied    Home Living Family/patient expects to be discharged to:: Skilled nursing facility                   Additional Comments: from home    Prior Function Prior Level of Function : Independent/Modified Independent;Needs assist             Mobility Comments: walked with rollator in the home ADLs Comments: caregiver assisted with meals and housekeeping, driving, etc; pt showered in WIS with seat and grabbars independent     Hand Dominance        Extremity/Trunk Assessment   Upper Extremity Assessment Upper Extremity Assessment: LUE deficits/detail LUE Deficits / Details: L elbow arthritic pain so limits flexion/extension strength 3+/5 with pain and shoulder flexion only to 75    Lower Extremity Assessment Lower Extremity Assessment: LLE deficits/detail LLE Deficits / Details: AAROM about 30-40 degrees flexion in supine, knee extension strength 3+/5, ankle DF WFL    Cervical / Trunk Assessment Cervical / Trunk Assessment: Kyphotic  Communication   Communication: HOH (L hearing aide)  Cognition Arousal/Alertness: Awake/alert Behavior During Therapy: WFL for tasks assessed/performed Overall Cognitive Status: Within Functional Limits for tasks assessed                                          General Comments General comments (skin integrity, edema, etc.): Patient standing at bedside with RW and PT attempted to move stretcher to allow pt to be closer to [facility] prior to sitting, but pt was leaning against stretcher so LOB and PT and pt's caregiver in the room assisted pt to sit on floor.  Nursing in to assist to lift pt to chair then pt able to stand and step back and up to stretcher with RW.  RN and MD aware and placed ice to L hip.    Exercises     Assessment/Plan     PT Assessment Patient needs continued PT services  PT Problem List Decreased strength;Decreased mobility;Decreased activity tolerance;Decreased balance;Pain       PT Treatment Interventions DME instruction;Therapeutic activities;Gait training;Therapeutic exercise;Patient/family education;Balance training;Functional mobility training    PT Goals (Current goals can be found in the Care Plan section)  Acute Rehab PT Goals Patient Stated Goal: agreeable to ST rehab prior to home PT Goal Formulation: With patient Time For Goal Achievement: 02/07/21 Potential to Achieve Goals: Fair    Frequency Min 3X/week   Barriers to discharge        Co-evaluation               AM-PAC PT "6 Clicks" Mobility  Outcome Measure Help needed turning from your back to your side while in a flat bed without using bedrails?: A Lot Help needed moving from lying on your back to sitting on the side of a flat bed without using bedrails?: A Lot Help needed moving to and from a bed to a chair (  including a wheelchair)?: A Lot Help needed standing up from a chair using your arms (e.g., wheelchair or bedside chair)?: A Lot Help needed to walk in hospital room?: Total Help needed climbing 3-5 steps with a railing? : Total 6 Click Score: 10    End of Session Equipment Utilized During Treatment: Oxygen Activity Tolerance: Patient limited by pain Patient left: in bed;with call bell/phone within reach;with family/visitor present   PT Visit Diagnosis: History of falling (Z91.81);Other abnormalities of gait and mobility (R26.89);Pain Pain - Right/Left: Left Pain - part of body: Hip    Time: 1355-1440 PT Time Calculation (min) (ACUTE ONLY): 45 min   Charges:   PT Evaluation $PT Eval Moderate Complexity: 1 Mod PT Treatments $Therapeutic Activity: 8-22 mins        Magda Kiel, PT Acute Rehabilitation Services JDYNX:[phone] [phone] 01/24/2021   Reginia Naas 01/24/2021, 3:22  PM

## 2021-01-24 NOTE — Assessment & Plan Note (Signed)
Heart rate controlled on Plavix only at baseline. - Resume Plavix

## 2021-01-24 NOTE — Assessment & Plan Note (Signed)
-   Weightbearing as tolerated - PT eval, recommending SNF  - Scheduled Tylenol plus as needed oxycodone for pain control - Follow-up with Dr. Ronnie Derby in 1 to 2 weeks

## 2021-01-24 NOTE — H&P (Signed)
History and Physical    [name] CZY:[id number] DOB: 27-Jun-1926 DOA: 01/23/2021  PCP: Lawerance Cruel, MD   Patient coming from: Home  Chief Complaint: Left thigh pain after fall.  HPI: [name] is a 87 y.o. male with medical history significant for A-fib, HFpEF, CAD, BPH, OA with DJD, prostate cancer who presents by EMS after a fall at home.  Reports he was going to the bathroom and using his walker.  As he went to try to the block on the walker it slipped and he fell forward landing on his left side.  Reports he had instantaneous severe pain in his left upper thigh and was unable to stand up.  He also hit his head when he fell but did not have any loss of consciousness.  States he did not have any chest pain, palpitations or shortness of breath before or after the fall.  He denies any abdominal pain nausea vomiting or diarrhea.  He denies back pain or neck pain.  States he is not have any numbness or weakness in his extremities.  He takes Plavix as a blood thinner but is not on Coumadin or DOAC therapy. He has bilateral hip replacements secondary to DJD and OA in hips. Left hip was replaced 15-20 years ago he states.  Lives at home and has a caregiver at house. Denies Tobacco, ETOH or illicit drug use. Use to smoke but quit over 40 years ago.   ED Course: [name] has been hemodynamically stable and afebrile in the ER.  Initial x-rays negative for fracture of his left hip.  He continued to have severe pain so hip CT scan was obtained which showed periprosthetic fracture of the proximal femur.  CT of the head and neck were negative.  CT of the chest abdomen pelvis were negative for acute pathology.  CBC and CMP were unremarkable.  Initial lactic acid elevated 2.3.  Orthopedic surgery been consulted and will see patient in the morning and plan for surgical repair.  Hospitalist service asked to admit patient for further management  Review of Systems:  General: Denies fever,  chills, weight loss, night sweats.  Denies dizziness.  Denies change in appetite HENT: Denies change in hearing, tinnitus.  Denies nasal bleeding. Denies sore throat. Denies difficulty swallowing Eyes: Denies blurry vision, pain in eye, drainage.  Denies discoloration of eyes. Neck: Denies pain.  Denies swelling.  Denies pain with movement. Cardiovascular: Denies chest pain, palpitations.  Denies edema.  Denies orthopnea Respiratory: Denies shortness of breath, cough.  Denies wheezing.  Denies sputum production Gastrointestinal: Denies abdominal pain, swelling. Denies nausea, vomiting, diarrhea.  Denies melena.  Denies hematemesis. Musculoskeletal: Reports left upper leg and hip pain, decreased movement of left leg due to pain.  Genitourinary: Denies pelvic pain.  Denies urinary frequency or hesitancy.  Denies dysuria.  Skin: Denies rash.  Denies petechiae, purpura, ecchymosis. Neurological:Denies syncope.  Denies seizure activity.  Denies slurred speech, drooping face.  Denies visual change. Psychiatric: Denies depression, anxiety.  Denies hallucinations.  Past Medical History:  Diagnosis Date   Anxiety    Arthritis    Atrial fibrillation, permanent ([place]) 01/07/2016   Blood transfusion    "related to a surgery"   BPH (benign prostatic hypertrophy) with urinary obstruction    s/p turp yrs ago   Bradycardia    a. Amio d/c'd 08/2013; brady arrest 08/2013 after PCI >>> recurrent AF >>> Amiodarone restarted. b. Pacemaker being considered in 11/2014.   CAD (coronary artery  disease)    a. s/p MI and prior PCI of LAD;  b. LHC (08/2013):  prox LAD 60-70%, mid LAD stents ok, ostial lesion at Dx jailed by stent, mild CFX and RCA disesase >>>  PCI (09/08/13):  rotational atherectomy + Promus DES to prox LAD   Cardiomyopathy [facility])    a. Echo (08/2013):  EF 30-35%, AS hypokinesis, Gr 1 diast dysfn, mild MR, mild LAE >>> b. improved EF 50-55% by echo 8/15. c. EF down again by echo 12/2014 to 30-35% but 51%  by nuc.   Chronic lower back pain    Chronic systolic CHF (congestive heart failure) (HCC)    CKD (chronic kidney disease), stage III ([place])    a. Per review of labs baseline Cr 1.1-1.3.   DDD (degenerative disc disease)    chronic back pain   Depression    Diverticulitis of colon with bleeding    s/p sigmoid resection '88   DJD (degenerative joint disease) hips and knees   s/p bilateral total replacements   GERD (gastroesophageal reflux disease)    occ. take prevacid   Glaucoma    Hearing impaired person, right    History of GI diverticular bleed april 2012   transfused blood and resolved without surgical intervention   Hypertension    Impaired hearing bilateral    only left hearing aid   Impotence, organic    s/p penile prosthesis 1990's   Incomplete bladder emptying    LBBB (left bundle branch block)    Meningioma (HCC) right -sided w/ right VI palsy   followed by dr Gaynell Face   Mitral regurgitation    a. Mild-mod by echo 12/2014.   Myocardial infarction ([place]) 1980's- medical intervention   "so mild I didn't know I'd had it"   PAF (paroxysmal atrial fibrillation) ([place])    not on coumadin due to hx of GI bleed.   Prostate cancer ([place]) 11/30/13   Gleason 8, volume 22.14 cc   Sleep apnea    non-compliant cpap   Uses hearing aid    left    Vitreous hemorrhage (HCC)    related to branch retinal vein occlusion left eye   Wears glasses     Past Surgical History:  Procedure Laterality Date   APPENDECTOMY     CARDIAC CATHETERIZATION  2007   noncritical cad (results w/ chart)   CARDIOVERSION N/A 10/13/2015   Procedure: CARDIOVERSION;  Surgeon: Josue Hector, MD;  Location: [facility] ENDOSCOPY;  Service: Cardiovascular;  Laterality: N/A;   CATARACT EXTRACTION W/ INTRAOCULAR LENS  IMPLANT, BILATERAL Bilateral ~ [place] Right "several"   CORONARY ANGIOPLASTY WITH STENT PLACEMENT  08-03-08   drug-eluting stent x2 distal and mid lad    EP IMPLANTABLE DEVICE N/A 01/31/2015   Procedure: BiV Pacemaker Insertion CRT-P;  Surgeon: Evans Lance, MD;  Location: [place] CV LAB;  Service: Cardiovascular;  Laterality: N/A;   EP IMPLANTABLE DEVICE N/A 02/07/2015   Procedure: Lead Revision/Repair;  Surgeon: Deboraha Sprang, MD;  Location: [place] CV LAB;  Service: Cardiovascular;  Laterality: N/A;   ESOPHAGOGASTRODUODENOSCOPY N/A 02/11/2014   Procedure: ESOPHAGOGASTRODUODENOSCOPY (EGD);  Surgeon: Cleotis Nipper, MD;  Location: [facility] ENDOSCOPY;  Service: Endoscopy;  Laterality: N/A;   gamma knife radiation  2000   [facility] for meningioma, last eval 2013- no change   GAS INSERTION Left 07/14/2019   Procedure: INSERTION OF GAS;  Surgeon: Hayden Pedro, MD;  Location: [facility]  OR;  Service: Ophthalmology;  Laterality: Left;   INGUINAL HERNIA REPAIR Bilateral    INNER EAR SURGERY Right yrs ago   "trying to get my hearing back   LEFT HEART CATHETERIZATION WITH CORONARY ANGIOGRAM N/A 09/04/2013   Procedure: LEFT HEART CATHETERIZATION WITH CORONARY ANGIOGRAM;  Surgeon: Jettie Booze, MD;  Location: [facility] CATH LAB;  Service: Cardiovascular;  Laterality: N/A;   PARS PLANA VITRECTOMY Left 07/14/2019   27 GAUGE   PARS PLANA VITRECTOMY 27 GAUGE Left 07/14/2019   Procedure: PARS PLANA VITRECTOMY 27 GAUGE;  Surgeon: Hayden Pedro, MD;  Location: [place];  Service: Ophthalmology;  Laterality: Left;   PENILE PROSTHESIS IMPLANT  1990's   PERCUTANEOUS CORONARY ROTOBLATOR INTERVENTION (PCI-R) N/A 09/08/2013   Procedure: PERCUTANEOUS CORONARY ROTOBLATOR INTERVENTION (PCI-R);  Surgeon: Jettie Booze, MD;  Location: [facility] CATH LAB;  Service: Cardiovascular;  Laterality: N/A;   PHOTOCOAGULATION WITH LASER Left 07/14/2019   Procedure: PHOTOCOAGULATION WITH LASER;  Surgeon: Hayden Pedro, MD;  Location: [place];  Service: Ophthalmology;  Laterality: Left;   PROSTATE BIOPSY  11/30/13   Gleason 8, vol 22.14 cc   REVISION TOTAL KNEE ARTHROPLASTY Right     SHOULDER OPEN ROTATOR CUFF REPAIR Left    TOTAL HIP ARTHROPLASTY Right 03-25-08--   TOTAL HIP ARTHROPLASTY Left 2005   TOTAL HIP REVISION Right 3-4 times   TOTAL KNEE ARTHROPLASTY Right 2004   TOTAL KNEE ARTHROPLASTY Left 1997   TRANSURETHRAL RESECTION OF PROSTATE  "years ago"   TRANSURETHRAL RESECTION OF PROSTATE  01/08/2011   Procedure: TRANSURETHRAL RESECTION OF THE PROSTATE (TURP);  Surgeon: Franchot Gallo;  Location: [place];  Service: Urology;  Laterality: N/A;  GYRUS     Social History  reports that he quit smoking about 62 years ago. His smoking use included cigarettes. He has a 14.00 pack-year smoking history. He has never used smokeless tobacco. He reports current alcohol use. He reports that he does not use drugs.  Allergies  Allergen Reactions   Gabapentin     Other reaction(s): mood changes   Torsemide    Codeine Anxiety and Other (See Comments)    Insomnia/ hyper   Omeprazole Nausea And Vomiting and Other (See Comments)    GI upset, insomnia   Warfarin Sodium Anxiety and Other (See Comments)    Hx GI bleed    Family History  Problem Relation Age of Onset   Heart disease Mother    Heart attack Mother    Emphysema Father    Cancer Brother        liver cancer   Cancer Other      Prior to Admission medications   Medication Sig Start Date End Date Taking? Authorizing Provider  bacitracin-polymyxin b (POLYSPORIN) ophthalmic ointment Place 1 application into the left eye 3 (three) times daily. apply to eye every 12 hours while awake 07/15/19   Hayden Pedro, MD  brimonidine (ALPHAGAN P) 0.1 % SOLN Place 1 drop into both eyes 2 (two) times daily.  08/11/15   [provider]  buPROPion (WELLBUTRIN XL) 150 MG 24 hr tablet Take 150 mg by mouth daily.    [provider]  clopidogrel (PLAVIX) 75 MG tablet TAKE 1 TABLET EVERY DAY Patient taking differently: Take 75 mg by mouth daily. 11/02/16   Jettie Booze, MD   cyclobenzaprine (FLEXERIL) 5 MG tablet Take 2.5 mg by mouth 3 (three) times daily as needed for muscle spasms.  11/20/18   [provider]  diazepam (VALIUM) 5 MG tablet Take 1 tablet (5 mg total) by mouth every 12 (twelve) hours as needed for anxiety. 01/07/16   Isaiah Serge, NP  dorzolamide-timolol (COSOPT) 22.3-6.8 MG/ML ophthalmic solution Place 1 drop into both eyes 2 (two) times daily.  11/12/13   [provider]  gatifloxacin (ZYMAXID) 0.5 % SOLN Place 1 drop into the left eye 4 (four) times daily. 07/15/19   Hayden Pedro, MD  latanoprost (XALATAN) 0.005 % ophthalmic solution Place 1 drop into the left eye at bedtime. 09/22/19   [provider]  Multiple Vitamins-Minerals (MULTIVITAMIN WITH MINERALS) tablet Take 1 tablet by mouth daily.    [provider]  nitroGLYCERIN (NITROSTAT) 0.4 MG SL tablet Place 1 tablet (0.4 mg total) under the tongue every 5 (five) minutes as needed for chest pain. 04/21/18   Evans Lance, MD  OVER THE COUNTER MEDICATION Take 1 capsule by mouth daily. IBguard    [provider]  pantoprazole (PROTONIX) 40 MG tablet Take 1 tablet by mouth daily. 03/28/19   [provider]  potassium chloride (K-DUR,KLOR-CON) 10 MEQ tablet TAKE 1 TABLET EVERY DAY AS NEEDED. WHEN YOU TAKE YOUR FUROSEMIDE. Patient taking differently: Take 10 mEq by mouth daily. 11/02/16   Jettie Booze, MD  prednisoLONE acetate (PRED FORTE) 1 % ophthalmic suspension Place 1 drop into the left eye 4 (four) times daily. 07/15/19   Hayden Pedro, MD  Probiotic Product (PROBIOTIC PO) Take 1 tablet by mouth daily. ALIGN    [provider]  psyllium (METAMUCIL) 58.6 % packet Take 1 packet by mouth daily with supper. Mix in 8 oz liquid and drink    [provider]  tamsulosin (FLOMAX) 0.4 MG CAPS capsule Take 0.4 mg by mouth daily. 05/17/18   [provider]  torsemide (DEMADEX) 20 MG tablet Take one extra tablet of  torsemide by mouth if weight gain of 3 pounds Patient taking differently: Take 20 mg by mouth daily. Take one extra tablet of torsemide by mouth if weight gain of 3 pounds 08/28/17   Evans Lance, MD  traMADol (ULTRAM) 50 MG tablet Take 50 mg by mouth every 6 (six) hours as needed for moderate pain.  03/15/15   [provider]  traZODone (DESYREL) 50 MG tablet Take 50 mg by mouth at bedtime as needed for sleep.     [provider]    Physical Exam: Vitals:   01/24/21 0200 01/24/21 0230 01/24/21 0300 01/24/21 0342  BP: 111/60 121/75 129/77   Pulse: 72 78 80   Resp: 18  18   Temp:      TempSrc:      SpO2: 97% 97% 96% 95%  Weight:      Height:        Constitutional: NAD, calm, comfortable Vitals:   01/24/21 0200 01/24/21 0230 01/24/21 0300 01/24/21 0342  BP: 111/60 121/75 129/77   Pulse: 72 78 80   Resp: 18  18   Temp:      TempSrc:      SpO2: 97% 97% 96% 95%  Weight:      Height:       General: WDWN, Alert and oriented x3.  Eyes: EOMI, PERRL, conjunctivae normal.  Sclera nonicteric HENT:  [place], small abrasion on top of head, external ears normal.  Nares patent without epistasis.  Mucous membranes are moist. Neck: Soft, normal range of motion, supple, no masses, Trachea midline Respiratory: clear to auscultation bilaterally, no wheezing,  no crackles. Normal respiratory effort. No accessory muscle use.  Cardiovascular: Irregular rhythm with normal rate. Pacemaker in left upper chest. no murmurs / rubs / gallops.  extremity edema. 2+ pedal pulses.  Abdomen: Soft, no tenderness, nondistended, no rebound or guarding. No masses palpated. Bowel sounds normoactive Musculoskeletal: FROM of upper extremities. Normal ROM of right leg. Left leg not moved due to pain. Left anterior and lateral proximal femur tender to palpation. no cyanosis. No joint deformity upper and lower extremities. Normal muscle tone. Small abrasion below left knee, no bleeding Skin: Warm, dry, intact  no rashes, lesions. No induration Neurologic: CN 2-12 grossly intact. Normal speech. Sensation intact to touch. Strength 5/5 in upper extremities  Psychiatric: Normal mood and affect.   Labs on Admission: I have personally reviewed following labs and imaging studies  CBC: Recent Labs  Lab 01/23/21 2200 01/23/21 2225  WBC 6.6  --   HGB 13.0 13.3  HCT 40.0 39.0  MCV 94.8  --   PLT 125*  --     Basic Metabolic Panel: Recent Labs  Lab 01/23/21 2200 01/23/21 2225  NA 137 136  K 4.0 4.0  CL 102 101  CO2 25  --   GLUCOSE 153* 147*  BUN 15 15  CREATININE 1.14 1.10  CALCIUM 8.6*  --     GFR: Estimated Creatinine Clearance: 41.5 mL/min (by C-G formula based on SCr of 1.1 mg/dL).  Liver Function Tests: Recent Labs  Lab 01/23/21 2200  AST 23  ALT 18  ALKPHOS 84  BILITOT 0.6  PROT 5.5*  ALBUMIN 3.3*    Urine analysis:    Component Value Date/Time   COLORURINE STRAW (A) 01/23/2021 2200   APPEARANCEUR CLEAR 01/23/2021 2200   LABSPEC 1.005 01/23/2021 2200   PHURINE 5.0 01/23/2021 2200   GLUCOSEU NEGATIVE 01/23/2021 2200   HGBUR NEGATIVE 01/23/2021 2200   BILIRUBINUR NEGATIVE 01/23/2021 2200   KETONESUR NEGATIVE 01/23/2021 2200   PROTEINUR NEGATIVE 01/23/2021 2200   UROBILINOGEN 0.2 01/01/2015 1207   NITRITE NEGATIVE 01/23/2021 2200   LEUKOCYTESUR NEGATIVE 01/23/2021 2200    Radiological Exams on Admission: CT ABDOMEN PELVIS WO CONTRAST  Result Date: 01/24/2021 CLINICAL DATA:  Fall EXAM: CT CHEST, ABDOMEN AND PELVIS WITHOUT CONTRAST TECHNIQUE: Multidetector CT imaging of the chest, abdomen and pelvis was performed following the standard protocol without IV contrast. COMPARISON:  None. FINDINGS: CT CHEST FINDINGS Cardiovascular: Calcific aortic atherosclerosis. Coronary artery atherosclerotic calcification. No pericardial effusion. Mediastinum/Nodes: No mediastinal hematoma. No mediastinal, hilar or axillary lymphadenopathy. The visualized thyroid and thoracic  esophageal course are unremarkable. Lungs/Pleura: No pulmonary contusion, pneumothorax or pleural effusion. The central airways are clear. Musculoskeletal: No acute fracture of the ribs, sternum or the visible portions of clavicles and scapulae. CT ABDOMEN PELVIS FINDINGS Hepatobiliary: No hepatic hematoma or laceration. No biliary dilatation. Status post cholecystectomy. Pancreas: Normal contours without ductal dilatation. No peripancreatic fluid collection. Spleen: No splenic laceration or hematoma. Adrenals/Urinary Tract: --Adrenal glands: No adrenal hemorrhage. --Right kidney/ureter: No hydronephrosis or perinephric hematoma. --Left kidney/ureter: No hydronephrosis or perinephric hematoma. --Urinary bladder: Unremarkable. Stomach/Bowel: --Stomach/Duodenum: No hiatal hernia or other gastric abnormality. Normal duodenal course and caliber. --Small bowel: No dilatation or inflammation. --Colon: No focal abnormality. --Appendix: Normal. Vascular/Lymphatic: Atherosclerotic calcification is present within the non-aneurysmal abdominal aorta, without hemodynamically significant stenosis. No abdominal or pelvic lymphadenopathy. Reproductive: Penile prosthesis. Musculoskeletal. No pelvic fractures.  Bilateral hip prostheses. Other: None. IMPRESSION: 1. No acute abnormality of the chest, abdomen or pelvis. 2. Coronary artery and aortic  Atherosclerosis (ICD10-I70.0). Electronically Signed   By: Ulyses Jarred M.D.   On: 01/24/2021 03:09   CT HEAD WO CONTRAST  Result Date: 01/24/2021 CLINICAL DATA:  Trauma EXAM: CT HEAD WITHOUT CONTRAST CT CERVICAL SPINE WITHOUT CONTRAST TECHNIQUE: Multidetector CT imaging of the head and cervical spine was performed following the standard protocol without intravenous contrast. Multiplanar CT image reconstructions of the cervical spine were also generated. COMPARISON:  CT brain 09/09/2018, 06/21/2015 FINDINGS: CT HEAD FINDINGS Brain: No acute territorial infarction or hemorrhage is  visualized. Advanced atrophy. Slight asymmetric enlargement of left convexity CSF space, suspect that there may be chronic subdural effusion. Similar slight rightward shift. The ventricles are stable in size. Vascular: No hyperdense vessels. Vertebral and carotid vascular calcification. . Skull: Redemonstrated osseous destructive process at the right clavus and cavernous sinus. No fracture Sinuses/Orbits: Trace left mastoid opacification. Mild mucosal thickening in the sinuses Other: None CT CERVICAL SPINE FINDINGS Alignment: Straightening of the cervical spine. Trace retrolisthesis C4 on C5 and C5 on C6. Facet alignment is maintained Skull base and vertebrae: No acute fracture. No primary bone lesion or focal pathologic process. Soft tissues and spinal canal: No prevertebral fluid or swelling. No visible canal hematoma. Disc levels: Advanced degenerative changes C3 through C7. Bony fusion across C5-C6. Facet degenerative changes at multiple levels with foraminal stenosis. Upper chest: Negative. Other: None IMPRESSION: 1. No definite CT evidence for acute intracranial abnormality. Suspected chronic left subdural effusion. Atrophy 2. Straightening of the cervical spine with degenerative changes. No acute osseous abnormality 3. Chronic lytic/destructive process at the right clivus and involving the right cavernous sinus Electronically Signed   By: Donavan Foil M.D.   On: 01/24/2021 01:31   CT Chest Wo Contrast  Result Date: 01/24/2021 CLINICAL DATA:  Fall EXAM: CT CHEST, ABDOMEN AND PELVIS WITHOUT CONTRAST TECHNIQUE: Multidetector CT imaging of the chest, abdomen and pelvis was performed following the standard protocol without IV contrast. COMPARISON:  None. FINDINGS: CT CHEST FINDINGS Cardiovascular: Calcific aortic atherosclerosis. Coronary artery atherosclerotic calcification. No pericardial effusion. Mediastinum/Nodes: No mediastinal hematoma. No mediastinal, hilar or axillary lymphadenopathy. The visualized  thyroid and thoracic esophageal course are unremarkable. Lungs/Pleura: No pulmonary contusion, pneumothorax or pleural effusion. The central airways are clear. Musculoskeletal: No acute fracture of the ribs, sternum or the visible portions of clavicles and scapulae. CT ABDOMEN PELVIS FINDINGS Hepatobiliary: No hepatic hematoma or laceration. No biliary dilatation. Status post cholecystectomy. Pancreas: Normal contours without ductal dilatation. No peripancreatic fluid collection. Spleen: No splenic laceration or hematoma. Adrenals/Urinary Tract: --Adrenal glands: No adrenal hemorrhage. --Right kidney/ureter: No hydronephrosis or perinephric hematoma. --Left kidney/ureter: No hydronephrosis or perinephric hematoma. --Urinary bladder: Unremarkable. Stomach/Bowel: --Stomach/Duodenum: No hiatal hernia or other gastric abnormality. Normal duodenal course and caliber. --Small bowel: No dilatation or inflammation. --Colon: No focal abnormality. --Appendix: Normal. Vascular/Lymphatic: Atherosclerotic calcification is present within the non-aneurysmal abdominal aorta, without hemodynamically significant stenosis. No abdominal or pelvic lymphadenopathy. Reproductive: Penile prosthesis. Musculoskeletal. No pelvic fractures.  Bilateral hip prostheses. Other: None. IMPRESSION: 1. No acute abnormality of the chest, abdomen or pelvis. 2. Coronary artery and aortic Atherosclerosis (ICD10-I70.0). Electronically Signed   By: Ulyses Jarred M.D.   On: 01/24/2021 03:09   CT CERVICAL SPINE WO CONTRAST  Result Date: 01/24/2021 CLINICAL DATA:  Trauma EXAM: CT HEAD WITHOUT CONTRAST CT CERVICAL SPINE WITHOUT CONTRAST TECHNIQUE: Multidetector CT imaging of the head and cervical spine was performed following the standard protocol without intravenous contrast. Multiplanar CT image reconstructions of the cervical spine were also generated. COMPARISON:  CT brain 09/09/2018, 06/21/2015 FINDINGS: CT HEAD FINDINGS Brain: No acute territorial  infarction or hemorrhage is visualized. Advanced atrophy. Slight asymmetric enlargement of left convexity CSF space, suspect that there may be chronic subdural effusion. Similar slight rightward shift. The ventricles are stable in size. Vascular: No hyperdense vessels. Vertebral and carotid vascular calcification. . Skull: Redemonstrated osseous destructive process at the right clavus and cavernous sinus. No fracture Sinuses/Orbits: Trace left mastoid opacification. Mild mucosal thickening in the sinuses Other: None CT CERVICAL SPINE FINDINGS Alignment: Straightening of the cervical spine. Trace retrolisthesis C4 on C5 and C5 on C6. Facet alignment is maintained Skull base and vertebrae: No acute fracture. No primary bone lesion or focal pathologic process. Soft tissues and spinal canal: No prevertebral fluid or swelling. No visible canal hematoma. Disc levels: Advanced degenerative changes C3 through C7. Bony fusion across C5-C6. Facet degenerative changes at multiple levels with foraminal stenosis. Upper chest: Negative. Other: None IMPRESSION: 1. No definite CT evidence for acute intracranial abnormality. Suspected chronic left subdural effusion. Atrophy 2. Straightening of the cervical spine with degenerative changes. No acute osseous abnormality 3. Chronic lytic/destructive process at the right clivus and involving the right cavernous sinus Electronically Signed   By: Donavan Foil M.D.   On: 01/24/2021 01:31   DG Pelvis Portable  Result Date: 01/23/2021 CLINICAL DATA:  Fall. EXAM: PORTABLE PELVIS 1-2 VIEWS COMPARISON:  Pelvic radiograph dated 06/27/2009. FINDINGS: Bilateral total hip arthroplasties. The arthroplasty components appear intact and in anatomic alignment. No acute fracture or dislocation. The bones are osteopenic. Penile implant noted. There is advanced atherosclerotic calcification of the aorta. The soft tissues are grossly unremarkable. IMPRESSION: 1. No acute fracture or dislocation. 2.  Bilateral total hip arthroplasties. Electronically Signed   By: Anner Crete M.D.   On: 01/23/2021 22:25   CT Hip Left Wo Contrast  Result Date: 01/24/2021 CLINICAL DATA:  Left femoral fracture. EXAM: CT OF THE LEFT HIP WITHOUT CONTRAST TECHNIQUE: Multidetector CT imaging of the left hip was performed according to the standard protocol. Multiplanar CT image reconstructions were also generated. COMPARISON:  Left femoral series 01/23/2021. FINDINGS: Bones/Joint/Cartilage The bones are diffusely demineralized. Left hip total joint arthroplasty is again noted with an anterior proximal left femoral periprosthetic transverse fracture involving the anterior aspect of the greater trochanter extending medially and inferiorly to involve the anterior aspect of the proximal metaphysis of the bone, with the fracture fragment displaced anteriorly up to 1 cm. No displaced fracture is seen in the left pelvic ring, visualized right pelvic ring, left acetabulum and visualized lower left ilium. There are bridging osteophytes of the anterior left SI joint, slight spurring of the pubic symphysis. There is a 1.5 cm irregular sclerotic lesion in the left superior pubic ramus, which was also visible on a CT of the abdomen and pelvis of 11/02/2015. Ligaments Suboptimally assessed by CT. Muscles and Tendons No intramuscular hematoma is seen. Tendon anatomy is poorly demonstrated due to artifact from the left hip arthroplasty. Soft tissues Penile implant apparatus incidentally noted. The visualized bladder is unremarkable. No pelvic free air or fluid or soft tissue mass are observed. Prostate is surgically absent. IMPRESSION: Acute transverse periprosthetic fracture of the anterior aspect of the proximal left femur, with anterior distraction of the fracture fragment up to 1 cm. Additional findings discussed above. Electronically Signed   By: Telford Nab M.D.   On: 01/24/2021 03:10   DG Chest Port 1 View  Result Date:  01/23/2021 CLINICAL DATA:  Fall. EXAM: PORTABLE  CHEST 1 VIEW COMPARISON:  Chest x-ray 05/16/2019. FINDINGS: Left-sided ICD is again seen. The aorta is tortuous with atherosclerotic calcifications. The heart is enlarged. There are patchy airspace opacities in the left lung base. There is atelectasis in the right lung base. There is no pleural effusion or pneumothorax. There are few ill-defined small nodular densities in the right mid lung. No acute fractures are seen. IMPRESSION: 1. Left lower lobe airspace disease worrisome for infection. 2. Stable cardiomegaly. 3. There are few ill-defined nodular densities in the right mid lung, indeterminate. Findings may be infectious or inflammatory, but other etiologies are not excluded. Recommend short-term follow-up x-ray in 4-6 weeks to re-evaluate. Chest CT can be performed as clinically warranted. Electronically Signed   By: Ronney Asters M.D.   On: 01/23/2021 22:24   DG FEMUR PORT MIN 2 VIEWS LEFT  Result Date: 01/23/2021 CLINICAL DATA:  Fall. EXAM: LEFT FEMUR PORTABLE 2 VIEWS COMPARISON:  CT abdomen and pelvis 05/16/2019. FINDINGS: The bones are diffusely osteopenic. Left hip arthroplasty appears in anatomic alignment. On the frogleg lateral view of the left hip there is an acute fracture of the proximal femur laterally fracture fragment distracted 1.4 mm. Left knee arthroplasty appears in anatomic alignment. There are extensive vascular calcifications in the soft tissues. Penile pump is present. IMPRESSION: 1. Left hip arthroplasty in anatomic alignment. There is acute fracture of the proximal left femur. 2. Left knee arthroplasty appears uncomplicated. Electronically Signed   By: Ronney Asters M.D.   On: 01/23/2021 22:28    EKG: Independently reviewed.  EKG shows atrial fibrillation with occasional PVCs and right bundle branch block.  No acute ST elevation or depression.  QTc prolonged at 532  Assessment/Plan Principal Problem:   Femur fracture, left  [name] is admitted to medical telemetry floor.  Orthopedic surgery as he has proximal fracture of left femur around prosthesis.  Dilaudid for pain control provided.  Will need PT evaluation after surgical repair   Active Problems:   Atrial fibrillation, permanent  Pt has pacemaker. Is on plavix but not on DOAC.     Chronic systolic heart failure  No volume overload on CT chest. Pt breathing comfortably.  Home medications to be verified and reconciled by pharmacy and resumed.    Thrombocytopenia  Chronic. Stable.     Prolonged QT interval Avoid medications which could further prolong QT interval    Biventricular cardiac pacemaker in situ   DVT prophylaxis: SCDs for DVT prophylaxis overnight in anticipation of surgery for femur fracture  Code Status:   Full Code  Family Communication:  Diagnosis and plan discussed with patient.  He verbalized understanding agrees with plan.  Further recommendations to follow as clinical indicated Disposition Plan:   Patient is from:  Home  Anticipated DC to:  Home vs Rehab, To be determined  Anticipated DC date:  Anticipate 2 midnight or more stay in hospital  Consults called:  Orthopedic Surgery consulted by ER physician  Admission status:  Inpatient  Yevonne Aline Nathan Wilton MD [facility]  How to contact the [facility] Attending or Consulting provider [place] or covering provider during after hours [place], for this patient?   Check the care team in [facility] and look for a) attending/consulting [facility] provider listed and b) the [facility] team listed Log into [email or web address] and use [place]'s universal password to access. If you do not have the password, please contact the hospital operator. Locate the [facility] provider you are looking for under [facility]  and page to a number that you can be directly reached. If you still have difficulty reaching the provider, please page the [facility] (Director on Call) for the Hospitalists listed on amion for  assistance.  01/24/2021, 4:36 AM

## 2021-01-24 NOTE — Hospital Course (Addendum)
[name] is a 87 y.o. M with hx blindness, hx AF not on AC, sdCHF (previous EF 30-35%, more recently up), hx heart block s/p BiV pacer, CAD last stent 2015, CKD IIIa baseline 1.1-1.3, and prosCA who presented after a fall.    Had a mechanical fall at home when he slipped.  He had severe left thigh pain and could not stand.  In the ER, CT scan showed periprosthetic hip fracture.    11/29: Evaluated by Orthopedics who recommend WBAT, non-operative management, follow up with Dr. Ronnie Derby in 1-2 weeks

## 2021-01-24 NOTE — Assessment & Plan Note (Addendum)
Appears euvolemic.  Takes torsmeide PRN only

## 2021-01-24 NOTE — Progress Notes (Signed)
Progress Note    [name]   ELF:[id number]  DOB: 02-Jun-1926  DOA: 01/23/2021     0 Date of Service: 01/24/2021      Brief summary: [name] is a 87 y.o. M with hx blindness, hx AF not on AC, sdCHF (previous EF 30-35%, more recently up), hx heart block s/p BiV pacer, CAD last stent 2015, CKD IIIa baseline 1.1-1.3, and prosCA who presented after a fall.    Had a mechanical fall at home when he slipped.  He had severe left thigh pain and could not stand.  In the ER, CT scan showed periprosthetic hip fracture.    11/29: Evaluated by Orthopedics who recommend WBAT, non-operative management, follow up with [name] in 1-2 weeks        Assessment and Plan * Periprosthetic fracture around internal prosthetic left hip joint ([place]) - Weightbearing as tolerated - PT eval, recommending SNF  - Scheduled Tylenol plus as needed oxycodone for pain control - Follow-up with [name] in 1 to 2 weeks  Biventricular cardiac pacemaker in situ    Atrial fibrillation, permanent ([place]) Heart rate controlled on Plavix only at baseline. - Resume Plavix  Coronary artery disease involving native coronary artery of native heart without angina pectoris No angina - Resume home Plavix  Chronic combined systolic and diastolic CHF (congestive heart failure) ([facility]) Appears euvolemic.  Takes torsmeide PRN only   Stage 3a chronic kidney disease (CKD) ([place]) Creatinine baseline  Prolonged QT interval       Subjective:  Patient feels well.  He has no chest pain, dyspnea, orthopnea.  He was able to stand with PT and pain was only 3 out of 10, and however loss of balance, unable to sit down, was lowered to the ground slowly and controlled, and afterwards had 7 out of 10 pain.  Objective Vitals:   01/24/21 1200 01/24/21 1300 01/24/21 1400 01/24/21 1515  BP: 108/69 111/70 116/75 140/84  Pulse: 87 79 79 89  Resp: 19 16 19 16   Temp:    97.6 F (36.4 C)  TempSrc:    Oral  SpO2: 95% 94%  100% 97%  Weight:      Height:       81.6 kg  Vital signs were reviewed and unremarkable.   Exam Physical Exam Constitutional:      General: He is not in acute distress.    Appearance: He is not toxic-appearing.  HENT:     Head: Normocephalic and atraumatic.  Eyes:     General: No scleral icterus.    Comments: Amblyopia noted  Cardiovascular:     Rate and Rhythm: Normal rate. Rhythm irregular.     Heart sounds: No murmur heard.   No gallop.  Pulmonary:     Effort: Pulmonary effort is normal.     Breath sounds: No wheezing or rales.  Abdominal:     Palpations: Abdomen is soft.     Tenderness: There is no abdominal tenderness. There is no guarding.  Musculoskeletal:     Right lower leg: No edema.     Left lower leg: No edema.  Skin:    General: Skin is warm and dry.  Neurological:     General: No focal deficit present.     Mental Status: He is alert and oriented to person, place, and time.     Comments: Upper extremity strength appears normal, lower extremity strength not tested due to pain  Psychiatric:  Mood and Affect: Mood normal.        Behavior: Behavior normal.        Thought Content: Thought content normal.        Judgment: Judgment normal.       Labs / Other Information My review of labs, imaging, notes and other tests is significant for Stable creatinine, complete blood count normal     Disposition Plan: Status is: Inpatient  Remains inpatient appropriate because: X-rays unable to stand.  He will require significant rehabilitation to return to his prior independent level of function.  PT eval pending.        Time spent: 35 minutes [facility] 01/24/2021, 3:27 PM

## 2021-01-25 MED ORDER — KETOROLAC TROMETHAMINE 15 MG/ML IJ SOLN
15.0000 mg | Freq: Four times a day (QID) | INTRAMUSCULAR | Status: DC | PRN
  Administered 2021-01-25 – 2021-01-26 (×2): 15 mg via INTRAVENOUS
  Filled 2021-01-25 (×3): qty 1

## 2021-01-25 NOTE — Progress Notes (Signed)
No ICM remote transmission received for 01/23/2021 and next ICM transmission scheduled for 02/06/2021.

## 2021-01-25 NOTE — Progress Notes (Signed)
PROGRESS NOTE    [name]  RUE:[id number] DOB: 04/18/1926 DOA: 01/23/2021 PCP: Nathan Cruel, MD   Chief Complaint  Patient presents with   Fall  Brief Narrative/Hospital Course: [name], 87 y.o. male with PMH of A. fib not on anticoagulation, CHF with EF 30-35%, history of heart block status post BiV pacer, CAD last stent 2015, CKD 3A baseline creatinine 1.1-1.3, prostate cancer presents to the ED after a fall felt a mechanical fall at home when he slipped and found to have severe left thigh pain and could not stand In the ED had multiple x-rays and scans including chest x-ray pelvis x-ray CT chest abdomen pelvis, CT C-spine, CT left hip CT head, found to have periprosthetic hip fracture orthopedic was consulted recommended WBAT, nonoperative management and follow-up with Dr. Lorre [name] in 1 to 2 weeks   Subjective: Seen and examined this morning.  Patient reports she has not ambulated and would like to work with PT and he would like to walk before he can get released.Son is at the bedside.  Assessment & Plan:  Periprosthetic fracture around internal prosthetic left hip joint: Secondary to mechanical fall, WBAT, orthopedic consult appreciated plan is for nonoperative management pain control PT OT and skilled nursing facility placement.  Add IV Toradol as pain is poorly controlled on oral oxygen.  TOC working on a skilled nursing facility.  Outpatient follow-up with Dr. Reynaldo [name] in 1 to 2-week  Chronic combined systolic and diastolic CHF BiV cardiac pacemaker in place CAD Permanent A. Fib: Volume status compensated, A. fib rate controlled.  Currently no chest pain no angina.  Continue home Plavix, torsemide as needed  CKD stage IIIa: Baseline function 1.1-1.3 stable Recent Labs  Lab 01/23/21 2200 01/23/21 2225 01/24/21 0730  BUN 15 15 13   CREATININE 1.14 1.10 1.04    Prolonged QTC monitor  DVT prophylaxis: enoxaparin (LOVENOX) 40 MG/0.4ML injection Start: 01/24/21  2154 enoxaparin (LOVENOX) injection 40 mg Start: 01/24/21 2000 Code Status:   Code Status: Full Code Family Communication: plan of care discussed with patient, and son at bedside. Status is: Inpatient Remains inpatient appropriate because: Of ongoing pain management and need for skilled nursing facility pending PT OT and placement Disposition: Currently is medically stable for discharge. Anticipated Disposition: SNF   Objective: Vitals last 24 hrs: Vitals:   01/24/21 2045 01/25/21 0024 01/25/21 0426 01/25/21 0814  BP: (!) 105/57 104/67 104/66 118/67  Pulse: 78 79 76 82  Resp: 16 16 17 16   Temp: 97.6 F (36.4 C) 98 F (36.7 C) 97.9 F (36.6 C) (!) 97.5 F (36.4 C)  TempSrc: Oral Oral Oral Oral  SpO2: 94% 95% 95% 95%  Weight:      Height:       Weight change:   Intake/Output Summary (Last 24 hours) at 01/25/2021 1258 Last data filed at 01/25/2021 0024 Gross per 24 hour  Intake 360 ml  Output 750 ml  Net -390 ml   Net IO Since Admission: 110 mL [01/25/21 1258]   Physical Examination: General exam: Aa0x3, elderly, pleasant HEENT:Oral mucosa moist, Ear/Nose WNL grossly,dentition normal. Respiratory system: B/l clear BS, no use of accessory muscle, non tender. Cardiovascular system: S1 & S2 +,No JVD. Gastrointestinal system: Abdomen soft, NT,ND, BS+. Nervous System:Alert, awake, moving extremities. Extremities: edema none, distal peripheral pulses palpable.  Skin: No rashes, no icterus. MSK: Normal muscle bulk, tone, power.  Medications reviewed:  Scheduled Meds:  acetaminophen  1,000 mg Oral TID   brimonidine  1  drop Both Eyes BID   clopidogrel  75 mg Oral Daily   dorzolamide-timolol  1 drop Both Eyes BID   enoxaparin (LOVENOX) injection  40 mg Subcutaneous Q24H   mupirocin ointment  1 application Nasal BID   pantoprazole  40 mg Oral Daily   polyethylene glycol  17 g Oral Daily   tamsulosin  0.4 mg Oral Daily   Continuous Infusions: Diet Order              Diet Heart Room service appropriate? Yes; Fluid consistency: Thin  Diet effective now                 Weight change:   Wt Readings from Last 3 Encounters:  01/23/21 81.6 kg  09/26/20 81.6 kg  05/13/20 73.5 kg     Consultants:see note  Procedures:see note Antimicrobials: Anti-infectives (From admission, onward)    None      Culture/Microbiology    Component Value Date/Time   SDES  05/16/2019 1356    URINE, CLEAN CATCH Performed at [facility], [place] [address], [place], [place] [address]    SPECREQUEST  05/16/2019 1356    NONE Performed at [facility], [place] [address] [place], [place] [address]    CULT >=100,000 COLONIES/mL SERRATIA MARCESCENS (A) 05/16/2019 1356   REPTSTATUS 05/19/2019 FINAL 05/16/2019 1356    Other culture-see note  Unresulted Labs (From admission, onward)     Start     Ordered   01/31/21 0500  Creatinine, serum  (enoxaparin (LOVENOX)    CrCl >/= 30 ml/min)  Weekly,   R     Comments: while on enoxaparin therapy    01/24/21 1546          Data Reviewed: I have personally reviewed following labs and imaging studies CBC: Recent Labs  Lab 01/23/21 2200 01/23/21 2225 01/24/21 0730  WBC 6.6  --  7.7  HGB 13.0 13.3 12.3*  HCT 40.0 39.0 36.4*  MCV 94.8  --  93.6  PLT 125*  --  397*   Basic Metabolic Panel: Recent Labs  Lab 01/23/21 2200 01/23/21 2225 01/24/21 0730  NA 137 136 136  K 4.0 4.0 3.6  CL 102 101 104  CO2 25  --  24  GLUCOSE 153* 147* 123*  BUN 15 15 13   CREATININE 1.14 1.10 1.04  CALCIUM 8.6*  --  8.4*   GFR: Estimated Creatinine Clearance: 43.8 mL/min (by C-G formula based on SCr of 1.04 mg/dL). Liver Function Tests: Recent Labs  Lab 01/23/21 2200  AST 23  ALT 18  ALKPHOS 84  BILITOT 0.6  PROT 5.5*  ALBUMIN 3.3*   No results for input(s): LIPASE, AMYLASE in the last 168 hours. No results for input(s): AMMONIA in the last 168 hours. Coagulation Profile: Recent Labs   Lab 01/23/21 2200  INR 1.1   Cardiac Enzymes: No results for input(s): CKTOTAL, CKMB, CKMBINDEX, TROPONINI in the last 168 hours. BNP (last 3 results) No results for input(s): PROBNP in the last 8760 hours. HbA1C: No results for input(s): HGBA1C in the last 72 hours. CBG: No results for input(s): GLUCAP in the last 168 hours. Lipid Profile: No results for input(s): CHOL, HDL, LDLCALC, TRIG, CHOLHDL, LDLDIRECT in the last 72 hours. Thyroid Function Tests: No results for input(s): TSH, T4TOTAL, FREET4, T3FREE, THYROIDAB in the last 72 hours. Anemia Panel: No results for input(s): VITAMINB12, FOLATE, FERRITIN, TIBC, IRON, RETICCTPCT in the last 72 hours. Sepsis Labs: Recent Labs  Lab  01/23/21 2200 01/24/21 0730  LATICACIDVEN 2.3* 2.4*    Recent Results (from the past 240 hour(s))  Resp Panel by RT-PCR (Flu A&B, Covid) Nasopharyngeal Swab     Status: None   Collection Time: 01/23/21 10:00 PM   Specimen: Nasopharyngeal Swab; Nasopharyngeal(NP) swabs in vial transport medium  Result Value Ref Range Status   SARS Coronavirus 2 by RT PCR NEGATIVE NEGATIVE Final    Comment: (NOTE) SARS-CoV-2 target nucleic acids are NOT DETECTED.  The SARS-CoV-2 RNA is generally detectable in upper respiratory specimens during the acute phase of infection. The lowest concentration of SARS-CoV-2 viral copies this assay can detect is 138 copies/mL. A negative result does not preclude SARS-Cov-2 infection and should not be used as the sole basis for treatment or other patient management decisions. A negative result may occur with  improper specimen collection/handling, submission of specimen other than nasopharyngeal swab, presence of viral mutation(s) within the areas targeted by this assay, and inadequate number of viral copies(<138 copies/mL). A negative result must be combined with clinical observations, patient history, and epidemiological information. The expected result is Negative.  Fact  Sheet for Patients:  EntrepreneurPulse.com.au  Fact Sheet for Healthcare Providers:  IncredibleEmployment.be  This test is no t yet approved or cleared by the [place] FDA and  has been authorized for detection and/or diagnosis of SARS-CoV-2 by FDA under an Emergency Use Authorization (EUA). This EUA will remain  in effect (meaning this test can be used) for the duration of the COVID-19 declaration under Section 564(b)(1) of the Act, 21 U.S.C.section 360bbb-3(b)(1), unless the authorization is terminated  or revoked sooner.       Influenza A by PCR NEGATIVE NEGATIVE Final   Influenza B by PCR NEGATIVE NEGATIVE Final    Comment: (NOTE) The Xpert Xpress SARS-CoV-2/FLU/RSV plus assay is intended as an aid in the diagnosis of influenza from Nasopharyngeal swab specimens and should not be used as a sole basis for treatment. Nasal washings and aspirates are unacceptable for Xpert Xpress SARS-CoV-2/FLU/RSV testing.  Fact Sheet for Patients: EntrepreneurPulse.com.au  Fact Sheet for Healthcare Providers: IncredibleEmployment.be  This test is not yet approved or cleared by the [place] FDA and has been authorized for detection and/or diagnosis of SARS-CoV-2 by FDA under an Emergency Use Authorization (EUA). This EUA will remain in effect (meaning this test can be used) for the duration of the COVID-19 declaration under Section 564(b)(1) of the Act, 21 U.S.C. section 360bbb-3(b)(1), unless the authorization is terminated or revoked.  Performed at [facility], [place] [address] [place], [place] [address]   Surgical PCR screen     Status: None   Collection Time: 01/24/21  3:44 PM   Specimen: Nasal Mucosa; Nasal Swab  Result Value Ref Range Status   MRSA, PCR NEGATIVE NEGATIVE Final   Staphylococcus aureus NEGATIVE NEGATIVE Final    Comment: (NOTE) The Xpert SA Assay (FDA approved for NASAL  specimens in patients 14 years of age and older), is one component of a comprehensive surveillance program. It is not intended to diagnose infection nor to guide or monitor treatment. Performed at [facility], [place] [address], [place], [place] [address]      Radiology Studies: CT ABDOMEN PELVIS WO CONTRAST  Result Date: 01/24/2021 CLINICAL DATA:  Fall EXAM: CT CHEST, ABDOMEN AND PELVIS WITHOUT CONTRAST TECHNIQUE: Multidetector CT imaging of the chest, abdomen and pelvis was performed following the standard protocol without IV contrast. COMPARISON:  None. FINDINGS: CT CHEST FINDINGS Cardiovascular:  Calcific aortic atherosclerosis. Coronary artery atherosclerotic calcification. No pericardial effusion. Mediastinum/Nodes: No mediastinal hematoma. No mediastinal, hilar or axillary lymphadenopathy. The visualized thyroid and thoracic esophageal course are unremarkable. Lungs/Pleura: No pulmonary contusion, pneumothorax or pleural effusion. The central airways are clear. Musculoskeletal: No acute fracture of the ribs, sternum or the visible portions of clavicles and scapulae. CT ABDOMEN PELVIS FINDINGS Hepatobiliary: No hepatic hematoma or laceration. No biliary dilatation. Status post cholecystectomy. Pancreas: Normal contours without ductal dilatation. No peripancreatic fluid collection. Spleen: No splenic laceration or hematoma. Adrenals/Urinary Tract: --Adrenal glands: No adrenal hemorrhage. --Right kidney/ureter: No hydronephrosis or perinephric hematoma. --Left kidney/ureter: No hydronephrosis or perinephric hematoma. --Urinary bladder: Unremarkable. Stomach/Bowel: --Stomach/Duodenum: No hiatal hernia or other gastric abnormality. Normal duodenal course and caliber. --Small bowel: No dilatation or inflammation. --Colon: No focal abnormality. --Appendix: Normal. Vascular/Lymphatic: Atherosclerotic calcification is present within the non-aneurysmal abdominal aorta, without hemodynamically  significant stenosis. No abdominal or pelvic lymphadenopathy. Reproductive: Penile prosthesis. Musculoskeletal. No pelvic fractures.  Bilateral hip prostheses. Other: None. IMPRESSION: 1. No acute abnormality of the chest, abdomen or pelvis. 2. Coronary artery and aortic Atherosclerosis (ICD10-I70.0). Electronically Signed   By: Ulyses Jarred M.D.   On: 01/24/2021 03:09   CT HEAD WO CONTRAST  Result Date: 01/24/2021 CLINICAL DATA:  Trauma EXAM: CT HEAD WITHOUT CONTRAST CT CERVICAL SPINE WITHOUT CONTRAST TECHNIQUE: Multidetector CT imaging of the head and cervical spine was performed following the standard protocol without intravenous contrast. Multiplanar CT image reconstructions of the cervical spine were also generated. COMPARISON:  CT brain 09/09/2018, 06/21/2015 FINDINGS: CT HEAD FINDINGS Brain: No acute territorial infarction or hemorrhage is visualized. Advanced atrophy. Slight asymmetric enlargement of left convexity CSF space, suspect that there may be chronic subdural effusion. Similar slight rightward shift. The ventricles are stable in size. Vascular: No hyperdense vessels. Vertebral and carotid vascular calcification. . Skull: Redemonstrated osseous destructive process at the right clavus and cavernous sinus. No fracture Sinuses/Orbits: Trace left mastoid opacification. Mild mucosal thickening in the sinuses Other: None CT CERVICAL SPINE FINDINGS Alignment: Straightening of the cervical spine. Trace retrolisthesis C4 on C5 and C5 on C6. Facet alignment is maintained Skull base and vertebrae: No acute fracture. No primary bone lesion or focal pathologic process. Soft tissues and spinal canal: No prevertebral fluid or swelling. No visible canal hematoma. Disc levels: Advanced degenerative changes C3 through C7. Bony fusion across C5-C6. Facet degenerative changes at multiple levels with foraminal stenosis. Upper chest: Negative. Other: None IMPRESSION: 1. No definite CT evidence for acute intracranial  abnormality. Suspected chronic left subdural effusion. Atrophy 2. Straightening of the cervical spine with degenerative changes. No acute osseous abnormality 3. Chronic lytic/destructive process at the right clivus and involving the right cavernous sinus Electronically Signed   By: Donavan Foil M.D.   On: 01/24/2021 01:31   CT Chest Wo Contrast  Result Date: 01/24/2021 CLINICAL DATA:  Fall EXAM: CT CHEST, ABDOMEN AND PELVIS WITHOUT CONTRAST TECHNIQUE: Multidetector CT imaging of the chest, abdomen and pelvis was performed following the standard protocol without IV contrast. COMPARISON:  None. FINDINGS: CT CHEST FINDINGS Cardiovascular: Calcific aortic atherosclerosis. Coronary artery atherosclerotic calcification. No pericardial effusion. Mediastinum/Nodes: No mediastinal hematoma. No mediastinal, hilar or axillary lymphadenopathy. The visualized thyroid and thoracic esophageal course are unremarkable. Lungs/Pleura: No pulmonary contusion, pneumothorax or pleural effusion. The central airways are clear. Musculoskeletal: No acute fracture of the ribs, sternum or the visible portions of clavicles and scapulae. CT ABDOMEN PELVIS FINDINGS Hepatobiliary: No hepatic hematoma or laceration. No biliary dilatation. Status  post cholecystectomy. Pancreas: Normal contours without ductal dilatation. No peripancreatic fluid collection. Spleen: No splenic laceration or hematoma. Adrenals/Urinary Tract: --Adrenal glands: No adrenal hemorrhage. --Right kidney/ureter: No hydronephrosis or perinephric hematoma. --Left kidney/ureter: No hydronephrosis or perinephric hematoma. --Urinary bladder: Unremarkable. Stomach/Bowel: --Stomach/Duodenum: No hiatal hernia or other gastric abnormality. Normal duodenal course and caliber. --Small bowel: No dilatation or inflammation. --Colon: No focal abnormality. --Appendix: Normal. Vascular/Lymphatic: Atherosclerotic calcification is present within the non-aneurysmal abdominal aorta, without  hemodynamically significant stenosis. No abdominal or pelvic lymphadenopathy. Reproductive: Penile prosthesis. Musculoskeletal. No pelvic fractures.  Bilateral hip prostheses. Other: None. IMPRESSION: 1. No acute abnormality of the chest, abdomen or pelvis. 2. Coronary artery and aortic Atherosclerosis (ICD10-I70.0). Electronically Signed   By: Ulyses Jarred M.D.   On: 01/24/2021 03:09   CT CERVICAL SPINE WO CONTRAST  Result Date: 01/24/2021 CLINICAL DATA:  Trauma EXAM: CT HEAD WITHOUT CONTRAST CT CERVICAL SPINE WITHOUT CONTRAST TECHNIQUE: Multidetector CT imaging of the head and cervical spine was performed following the standard protocol without intravenous contrast. Multiplanar CT image reconstructions of the cervical spine were also generated. COMPARISON:  CT brain 09/09/2018, 06/21/2015 FINDINGS: CT HEAD FINDINGS Brain: No acute territorial infarction or hemorrhage is visualized. Advanced atrophy. Slight asymmetric enlargement of left convexity CSF space, suspect that there may be chronic subdural effusion. Similar slight rightward shift. The ventricles are stable in size. Vascular: No hyperdense vessels. Vertebral and carotid vascular calcification. . Skull: Redemonstrated osseous destructive process at the right clavus and cavernous sinus. No fracture Sinuses/Orbits: Trace left mastoid opacification. Mild mucosal thickening in the sinuses Other: None CT CERVICAL SPINE FINDINGS Alignment: Straightening of the cervical spine. Trace retrolisthesis C4 on C5 and C5 on C6. Facet alignment is maintained Skull base and vertebrae: No acute fracture. No primary bone lesion or focal pathologic process. Soft tissues and spinal canal: No prevertebral fluid or swelling. No visible canal hematoma. Disc levels: Advanced degenerative changes C3 through C7. Bony fusion across C5-C6. Facet degenerative changes at multiple levels with foraminal stenosis. Upper chest: Negative. Other: None IMPRESSION: 1. No definite CT  evidence for acute intracranial abnormality. Suspected chronic left subdural effusion. Atrophy 2. Straightening of the cervical spine with degenerative changes. No acute osseous abnormality 3. Chronic lytic/destructive process at the right clivus and involving the right cavernous sinus Electronically Signed   By: Donavan Foil M.D.   On: 01/24/2021 01:31   DG Pelvis Portable  Result Date: 01/23/2021 CLINICAL DATA:  Fall. EXAM: PORTABLE PELVIS 1-2 VIEWS COMPARISON:  Pelvic radiograph dated 06/27/2009. FINDINGS: Bilateral total hip arthroplasties. The arthroplasty components appear intact and in anatomic alignment. No acute fracture or dislocation. The bones are osteopenic. Penile implant noted. There is advanced atherosclerotic calcification of the aorta. The soft tissues are grossly unremarkable. IMPRESSION: 1. No acute fracture or dislocation. 2. Bilateral total hip arthroplasties. Electronically Signed   By: Anner Crete M.D.   On: 01/23/2021 22:25   CT Hip Left Wo Contrast  Result Date: 01/24/2021 CLINICAL DATA:  Left femoral fracture. EXAM: CT OF THE LEFT HIP WITHOUT CONTRAST TECHNIQUE: Multidetector CT imaging of the left hip was performed according to the standard protocol. Multiplanar CT image reconstructions were also generated. COMPARISON:  Left femoral series 01/23/2021. FINDINGS: Bones/Joint/Cartilage The bones are diffusely demineralized. Left hip total joint arthroplasty is again noted with an anterior proximal left femoral periprosthetic transverse fracture involving the anterior aspect of the greater trochanter extending medially and inferiorly to involve the anterior aspect of the proximal metaphysis of the bone, with  the fracture fragment displaced anteriorly up to 1 cm. No displaced fracture is seen in the left pelvic ring, visualized right pelvic ring, left acetabulum and visualized lower left ilium. There are bridging osteophytes of the anterior left SI joint, slight spurring of the  pubic symphysis. There is a 1.5 cm irregular sclerotic lesion in the left superior pubic ramus, which was also visible on a CT of the abdomen and pelvis of 11/02/2015. Ligaments Suboptimally assessed by CT. Muscles and Tendons No intramuscular hematoma is seen. Tendon anatomy is poorly demonstrated due to artifact from the left hip arthroplasty. Soft tissues Penile implant apparatus incidentally noted. The visualized bladder is unremarkable. No pelvic free air or fluid or soft tissue mass are observed. Prostate is surgically absent. IMPRESSION: Acute transverse periprosthetic fracture of the anterior aspect of the proximal left femur, with anterior distraction of the fracture fragment up to 1 cm. Additional findings discussed above. Electronically Signed   By: Telford Nab M.D.   On: 01/24/2021 03:10   DG Chest Port 1 View  Result Date: 01/23/2021 CLINICAL DATA:  Fall. EXAM: PORTABLE CHEST 1 VIEW COMPARISON:  Chest x-ray 05/16/2019. FINDINGS: Left-sided ICD is again seen. The aorta is tortuous with atherosclerotic calcifications. The heart is enlarged. There are patchy airspace opacities in the left lung base. There is atelectasis in the right lung base. There is no pleural effusion or pneumothorax. There are few ill-defined small nodular densities in the right mid lung. No acute fractures are seen. IMPRESSION: 1. Left lower lobe airspace disease worrisome for infection. 2. Stable cardiomegaly. 3. There are few ill-defined nodular densities in the right mid lung, indeterminate. Findings may be infectious or inflammatory, but other etiologies are not excluded. Recommend short-term follow-up x-ray in 4-6 weeks to re-evaluate. Chest CT can be performed as clinically warranted. Electronically Signed   By: Ronney Asters M.D.   On: 01/23/2021 22:24   DG FEMUR PORT MIN 2 VIEWS LEFT  Result Date: 01/23/2021 CLINICAL DATA:  Fall. EXAM: LEFT FEMUR PORTABLE 2 VIEWS COMPARISON:  CT abdomen and pelvis 05/16/2019.  FINDINGS: The bones are diffusely osteopenic. Left hip arthroplasty appears in anatomic alignment. On the frogleg lateral view of the left hip there is an acute fracture of the proximal femur laterally fracture fragment distracted 1.4 mm. Left knee arthroplasty appears in anatomic alignment. There are extensive vascular calcifications in the soft tissues. Penile pump is present. IMPRESSION: 1. Left hip arthroplasty in anatomic alignment. There is acute fracture of the proximal left femur. 2. Left knee arthroplasty appears uncomplicated. Electronically Signed   By: Ronney Asters M.D.   On: 01/23/2021 22:28     LOS: 1 day   Antonieta Pert, MD [facility]  01/25/2021, 12:58 PM

## 2021-01-25 NOTE — NC FL2 (Signed)
[place] LEVEL OF CARE SCREENING TOOL     IDENTIFICATION  Patient Name: [name] Birthdate: 1926-12-11 Sex: male Admission Date (Current Location): 01/23/2021  [facility] and [place] Number:  Herbalist and Address:  [facility], [place] [address] [place], [place] [address]      Provider Number: [id number]  Attending Physician Name and Address:  Antonieta Pert, MD  Relative Name and Phone Number:  [place], [place] [phone]  [phone]    Current Level of Care: Hospital Recommended Level of Care: [place] Prior Approval Number:    Date Approved/Denied:   PASRR Number: [id number]  Discharge Plan: SNF    Current Diagnoses: Patient Active Problem List   Diagnosis Date Noted   Periprosthetic fracture around internal prosthetic left hip joint ([place]) 01/24/2021   Prolonged QT interval 01/24/2021   Stage 3a chronic kidney disease (CKD) ([place]) 01/24/2021   Vitreous hemorrhage, left eye ([place]) 07/14/2019   Vitreous hemorrhage, left ([place]) 07/13/2019   Severe sepsis ([place]) 05/16/2019   Biventricular cardiac pacemaker in situ 04/30/2019   Atrial fibrillation, permanent ([place]) 01/07/2016   Orthostatic hypotension 02/06/2015   Weakness 01/05/2015   History of GI bleed 01/05/2015   Hypokalemia 01/05/2015   Dyspnea 11/30/2014   Fatigue 02/22/2014   Coronary artery disease involving native coronary artery of native heart without angina pectoris    Prostate cancer ([place])    GI bleed 02/09/2014   Chest pain 02/09/2014   Malignant neoplasm of prostate ([place]) 12/15/2013   Other malaise and fatigue 11/16/2013   Chronic combined systolic and diastolic CHF (congestive heart failure) ([place]) 10/12/2013   Left bundle branch block 10/12/2013   Angina, class III ([place]) 09/05/2013   Bradycardia    Mitral valve disorder 07/10/2013   Cough 07/10/2013   Nodular prostate without urinary obstruction 01/08/2011   Hypertrophy of  prostate without urinary obstruction and other lower urinary tract symptoms (LUTS) 01/08/2011   Impotence of organic origin 01/08/2011    Orientation RESPIRATION BLADDER Height & Weight     Self, Time, Situation, Place  Normal External catheter, Continent Weight: 180 lb (81.6 kg) Height:  5\' 8"  (172.7 cm)  BEHAVIORAL SYMPTOMS/MOOD NEUROLOGICAL BOWEL NUTRITION STATUS      Continent Diet (see discharge summary)  AMBULATORY STATUS COMMUNICATION OF NEEDS Skin   Limited Assist Verbally Skin abrasions                       Personal Care Assistance Level of Assistance  Bathing, Feeding, Dressing Bathing Assistance: Limited assistance Feeding assistance: Limited assistance Dressing Assistance: Limited assistance     Functional Limitations Info  Sight, Hearing, Speech Sight Info: Adequate Hearing Info: Impaired Speech Info: Adequate    SPECIAL CARE FACTORS FREQUENCY  PT (By licensed PT), OT (By licensed OT)     PT Frequency: 5x week OT Frequency: 5x week            Contractures Contractures Info: Not present    Additional Factors Info  Code Status, Allergies Code Status Info: full Allergies Info: Gabapentin, Torsemide, Codeine, Omeprazole, Warfarin Sodium           Current Medications (01/25/2021):  This is the current hospital active medication list Current Facility-Administered Medications  Medication Dose Route Frequency Provider Last Rate Last Admin   acetaminophen (TYLENOL) tablet 1,000 mg  1,000 mg Oral TID Edwin Dada, MD   1,000 mg at 01/25/21 0914   brimonidine (ALPHAGAN) 0.15 %  ophthalmic solution 1 drop  1 drop Both Eyes BID Edwin Dada, MD   1 drop at 01/25/21 6789   clopidogrel (PLAVIX) tablet 75 mg  75 mg Oral Daily Danford, Suann Larry, MD   75 mg at 01/25/21 0915   dorzolamide-timolol (COSOPT) 22.3-6.8 MG/ML ophthalmic solution 1 drop  1 drop Both Eyes BID Edwin Dada, MD   1 drop at 01/25/21 0917   enoxaparin  (LOVENOX) injection 40 mg  40 mg Subcutaneous Q24H Edwin Dada, MD   40 mg at 01/24/21 2158   mupirocin ointment (BACTROBAN) 2 % 1 application  1 application Nasal BID Edwin Dada, MD   1 application at 38/10/17 5102   oxyCODONE (Oxy IR/ROXICODONE) immediate release tablet 5 mg  5 mg Oral Q6H PRN Edwin Dada, MD   5 mg at 01/25/21 5852   pantoprazole (PROTONIX) EC tablet 40 mg  40 mg Oral Daily Chotiner, Yevonne Aline, MD   40 mg at 01/25/21 0914   polyethylene glycol (MIRALAX / GLYCOLAX) packet 17 g  17 g Oral Daily Edwin Dada, MD   17 g at 01/25/21 0912   senna-docusate (Senokot-S) tablet 1 tablet  1 tablet Oral QHS PRN Chotiner, Yevonne Aline, MD       tamsulosin [facility]) capsule 0.4 mg  0.4 mg Oral Daily Chotiner, Yevonne Aline, MD   0.4 mg at 01/25/21 0913   traZODone (DESYREL) tablet 50 mg  50 mg Oral QHS PRN Edwin Dada, MD   50 mg at 01/24/21 2157     Discharge Medications: Please see discharge summary for a list of discharge medications.  Relevant Imaging Results:  Relevant Lab Results:   Additional Information SSN: [id number].  Pt has been vaccinated for covid with one booster.  Joanne Chars, LCSW

## 2021-01-25 NOTE — Plan of Care (Signed)

## 2021-01-25 NOTE — Plan of Care (Signed)

## 2021-01-25 NOTE — Progress Notes (Signed)
Physical Therapy Treatment Patient Details Name: [name] MRN: [id number] DOB: 09/26/1926 Today's Date: 01/25/2021   History of Present Illness [name] is a 87 y.o. male who presents by EMS 01/23/21 after a fall at home. Found to have L periprosthetic hip fracture (non-op management) and cleared by ortho for WBAT. Pt with medical history significant for A-fib, heart failure, CAD, OA with DJD and bilateral hip and replacements, prostate cancer c/p resection, CKD, DDD, HTN bradycardia, L RTC surgery.    PT Comments    Pt was able to stand three times today with two person assist with RW.  He was unable to take any steps, but is progressing. He also tolerated in bed exercises.  He will likely be safer to progress gait with a second person's assist. PT will continue to follow acutely for safe mobility progression.  Recommendations for follow up therapy are one component of a multi-disciplinary discharge planning process, led by the attending physician.  Recommendations may be updated based on patient status, additional functional criteria and insurance authorization.  Follow Up Recommendations  Skilled nursing-short term rehab (<3 hours/day)     Assistance Recommended at Discharge Frequent or constant Supervision/Assistance  Equipment Recommendations  None recommended by PT    Recommendations for Other Services       Precautions / Restrictions Precautions Precautions: Fall Precaution Comments: 2 falls in the past year, this one was in the bathroom "I lost my balance" Restrictions LLE Weight Bearing: Weight bearing as tolerated     Mobility  Bed Mobility Overal bed mobility: Needs Assistance Bed Mobility: Supine to Sit;Sit to Supine     Supine to sit: Min assist;HOB elevated Sit to supine: Mod assist;HOB elevated   General bed mobility comments: Min assist to help progress L leg over EOB, support at trunk to come to sitting EOB from elevated HOB, heavy reliance  on rails for leverage.  Mod assist to help lift both legs back into the bed from sitting to supine.  Two person assist to scoot to [facility] pt assisting with half bridge and bil arms.    Transfers Overall transfer level: Needs assistance Equipment used: Rolling walker (2 wheels) Transfers: Sit to/from Stand Sit to Stand: Min assist;+2 physical assistance;From elevated surface           General transfer comment: Two person heavy min assist to stand from EOB x3, each time pt required less assistance.    Ambulation/Gait                   Stairs             Wheelchair Mobility    Modified Rankin (Stroke Patients Only)       Balance Overall balance assessment: Needs assistance Sitting-balance support: Feet supported;Bilateral upper extremity supported Sitting balance-Leahy Scale: Fair     Standing balance support: Bilateral upper extremity supported Standing balance-Leahy Scale: Poor Standing balance comment: needs support of RW and therapist, heavy reliance on bil UE to support him in standing. Performed 3 rounds of pre gait activities in standing standing a total of 2-3 mins each time.  Moving left leg forward and backward and then attempting to step forward with R LE.  He was never fully able to [facility] his left leg enough with his arms to take a step, but was able to pivot up the side of the bed on his right leg with support of therapist, RN and RW.  Cognition Arousal/Alertness: Awake/alert Behavior During Therapy: WFL for tasks assessed/performed Overall Cognitive Status: Within Functional Limits for tasks assessed                                 General Comments: HOH which requires frequent repetition        Exercises General Exercises - Upper Extremity Shoulder Flexion: AROM;Both;5 reps General Exercises - Lower Extremity Ankle Circles/Pumps: AROM;Both;10 reps Heel Slides: Left;5 reps;AAROM Hip  ABduction/ADduction: Left;5 reps;AAROM    General Comments        Pertinent Vitals/Pain Pain Assessment: Faces Faces Pain Scale: Hurts even more Pain Location: L hip with attempts at weight bearing and with moving supine to sit Pain Descriptors / Indicators: Grimacing;Guarding;Moaning;Aching Pain Intervention(s): Limited activity within patient's tolerance;Monitored during session;Repositioned    Home Living                          Prior Function            PT Goals (current goals can now be found in the care plan section) Acute Rehab PT Goals Patient Stated Goal: agreeable to ST rehab prior to home Progress towards PT goals: Progressing toward goals    Frequency    Min 3X/week      PT Plan Current plan remains appropriate    Co-evaluation              AM-PAC PT "6 Clicks" Mobility   Outcome Measure  Help needed turning from your back to your side while in a flat bed without using bedrails?: A Lot Help needed moving from lying on your back to sitting on the side of a flat bed without using bedrails?: A Little Help needed moving to and from a bed to a chair (including a wheelchair)?: A Lot Help needed standing up from a chair using your arms (e.g., wheelchair or bedside chair)?: A Lot Help needed to walk in hospital room?: Total Help needed climbing 3-5 steps with a railing? : Total 6 Click Score: 11    End of Session Equipment Utilized During Treatment: Gait belt   Patient left: in bed;with call bell/phone within reach;with bed alarm set Nurse Communication: Mobility status PT Visit Diagnosis: History of falling (Z91.81);Other abnormalities of gait and mobility (R26.89);Pain Pain - Right/Left: Left Pain - part of body: Hip     Time: 3086-5784 PT Time Calculation (min) (ACUTE ONLY): 48 min  Charges:  $Therapeutic Exercise: 8-22 mins $Therapeutic Activity: 23-37 mins                     Verdene Lennert, PT, DPT  Acute Rehabilitation Ortho  Tech Supervisor [phone] pager [phone]) [phone] office

## 2021-01-25 NOTE — Plan of Care (Signed)

## 2021-01-25 NOTE — TOC Initial Note (Signed)
Transition of Care [facility]) - Initial/Assessment Note    Patient Details  Name: [name] MRN: [id number] Date of Birth: Apr 18, 1926  Transition of [facility]) CM/SW Contact:    Nathan Chars, [name] Phone Number: 01/25/2021, 10:19 AM  Clinical Narrative:   CSW met with pt and son [name] regarding recommendation for SNF.  Pt hearing aid has a dead battery, pt unable to participate in discussion but did tell CSW to "talk to my son."  Son is in agreement with plan for SNF, choice document given, permission given to send out referral in hub.  Interest in [facility]. Pt lives alone but has [facility] [place] aide in place.  Pt is vaccinated for covid with one booster, per son.  Referral sent out in hub for SNF.               Expected Discharge Plan: Skilled Nursing Facility Barriers to Discharge: SNF Pending bed offer, Continued Medical Work up   Patient Goals and CMS Choice   CMS Medicare.gov Compare Post Acute Care list provided to:: Patient Represenative (must comment) Choice offered to / list presented to : Adult Children  Expected Discharge Plan and Services Expected Discharge Plan: [place] In-house Referral: Clinical Social Work   Post Acute Care Choice: [place] Living arrangements for the past 2 months: [place]                                      Prior Living Arrangements/Services Living arrangements for the past 2 months: Single Family Home Lives with:: Self Patient language and need for interpreter reviewed:: Yes        Need for Family Participation in Patient Care: Yes (Comment) Care giver support system in place?: Yes (comment) Current home services: Homehealth aide ([address]7 [place] aide in place-private hire arrangement.) Criminal Activity/Legal Involvement Pertinent to Current Situation/Hospitalization: No - Comment as needed  Activities of Daily Living Home Assistive Devices/Equipment: Environmental consultant (specify type)  (rolling) ADL Screening (condition at time of admission) Patient's cognitive ability adequate to safely complete daily activities?: Yes Is the patient deaf or have difficulty hearing?: Yes Does the patient have difficulty seeing, even when wearing glasses/contacts?: No Does the patient have difficulty concentrating, remembering, or making decisions?: No Patient able to express need for assistance with ADLs?: Yes Does the patient have difficulty dressing or bathing?: Yes Independently performs ADLs?: Yes (appropriate for developmental age) Does the patient have difficulty walking or climbing stairs?: Yes Weakness of Legs: None Weakness of Arms/Hands: None  Permission Sought/Granted Permission sought to share information with : Family Supports Permission granted to share information with : Yes, Verbal Permission Granted  Share Information with NAME: son [name]  Permission granted to share info w AGENCY: SNF        Emotional Assessment Appearance:: Appears stated age Attitude/Demeanor/Rapport: Engaged Affect (typically observed): Appropriate, Pleasant Orientation: :  (Not recorded--appears oriented) Alcohol / Substance Use: Not Applicable Psych Involvement: No (comment)  Admission diagnosis:  Fall [W19.XXXA] Femur fracture, left ([place]) [S72.92XA] Periprosthetic fracture of hip, initial encounter [[id number], [place] Patient Active Problem List   Diagnosis Date Noted   Periprosthetic fracture around internal prosthetic left hip joint ([place]) 01/24/2021   Prolonged QT interval 01/24/2021   Stage 3a chronic kidney disease (CKD) ([place]) 01/24/2021   Vitreous hemorrhage, left eye ([place]) 07/14/2019   Vitreous hemorrhage, left ([place]) 07/13/2019   Severe sepsis ([place]) 05/16/2019  Biventricular cardiac pacemaker in situ 04/30/2019   Atrial fibrillation, permanent ([place]) 01/07/2016   Orthostatic hypotension 02/06/2015   Weakness 01/05/2015   History of GI bleed 01/05/2015   Hypokalemia  01/05/2015   Dyspnea 11/30/2014   Fatigue 02/22/2014   Coronary artery disease involving native coronary artery of native heart without angina pectoris    Prostate cancer ([place])    GI bleed 02/09/2014   Chest pain 02/09/2014   Malignant neoplasm of prostate ([place]) 12/15/2013   Other malaise and fatigue 11/16/2013   Chronic combined systolic and diastolic CHF (congestive heart failure) ([place]) 10/12/2013   Left bundle branch block 10/12/2013   Angina, class III ([place]) 09/05/2013   Bradycardia    Mitral valve disorder 07/10/2013   Cough 07/10/2013   Nodular prostate without urinary obstruction 01/08/2011   Hypertrophy of prostate without urinary obstruction and other lower urinary tract symptoms (LUTS) 01/08/2011   Impotence of organic origin 01/08/2011   PCP:  Lawerance Cruel, MD Pharmacy:   [facility] [id number] - [place], [place] [address] [place] [address] [place] [address] Phone: [phone] Fax: [phone]  [facility] [place], [address] [place] [address] Phone: [phone] Fax: [phone]  [facility] [place], [place] [address] Phone: [phone] Fax: [phone]  [facility] [place], [address] AT [address] [place] [address] Phone: [phone] Fax: [phone]     Social Determinants of Health (SDOH) Interventions    Readmission Risk Interventions No flowsheet data found.

## 2021-01-26 LAB — BASIC METABOLIC PANEL
Anion gap: 7 (ref 5–15)
BUN: 27 mg/dL — ABNORMAL HIGH (ref 8–23)
CO2: 26 mmol/L (ref 22–32)
Calcium: 8.8 mg/dL — ABNORMAL LOW (ref 8.9–10.3)
Chloride: 102 mmol/L (ref 98–111)
Creatinine, Ser: 1.33 mg/dL — ABNORMAL HIGH (ref 0.61–1.24)
GFR, Estimated: 50 mL/min — ABNORMAL LOW (ref >60)
Glucose, Bld: 134 mg/dL — ABNORMAL HIGH (ref 70–99)
Potassium: 4.2 mmol/L (ref 3.5–5.1)
Sodium: 135 mmol/L (ref 135–145)

## 2021-01-26 LAB — RESP PANEL BY RT-PCR (FLU A&B, COVID) ARPGX2
Influenza A by PCR: NEGATIVE
Influenza B by PCR: NEGATIVE
SARS Coronavirus 2 by RT PCR: NEGATIVE

## 2021-01-26 LAB — HEMOGLOBIN AND HEMATOCRIT, BLOOD
HCT: 37.5 % — ABNORMAL LOW (ref 39.0–52.0)
Hemoglobin: 12.7 g/dL — ABNORMAL LOW (ref 13.0–17.0)

## 2021-01-26 MED ORDER — SODIUM CHLORIDE 0.9 % IV SOLN: INTRAVENOUS | Status: AC

## 2021-01-26 NOTE — Plan of Care (Signed)

## 2021-01-26 NOTE — Progress Notes (Signed)
PROGRESS NOTE    [name]  UUV:[id number] DOB: 09-20-1926 DOA: 01/23/2021 PCP: Nathan Cruel, MD   Chief Complaint  Patient presents with   Fall  Brief Narrative/Hospital Course: [name], 87 y.o. male with PMH of A. fib not on anticoagulation, CHF with EF 30-35%, history of heart block status post BiV pacer, CAD last stent 2015, CKD 3A baseline creatinine 1.1-1.3, prostate cancer presents to the ED after a fall felt a mechanical fall at home when he slipped and found to have severe left thigh pain and could not stand In the ED had multiple x-rays and scans including chest x-ray pelvis x-ray CT chest abdomen pelvis, CT C-spine, CT left hip CT head, found to have periprosthetic hip fracture orthopedic was consulted recommended WBAT, nonoperative management and follow-up with Dr. Lorre [name] in 1 to 2 weeks   Subjective: Seen this morning blood pressure soft in 90s/50s and complaints of being dizzy Pain is controlled.  Assessment & Plan:  Periprosthetic fracture around internal prosthetic left hip joint: Secondary to mechanical fall, WBAT, orthopedic consult appreciated plan is for nonoperative management pain control PT OT and skilled nursing facility placement.  Add IV Toradol as pain is poorly controlled on oral oxygen.  TOC working on a skilled nursing facility has a bed today but patient is reluctant for going home given his dizziness and soft BP.  Outpatient follow-up with Dr. Reynaldo [name] in 1 to 2-week  Chronic combined systolic and diastolic CHF BiV cardiac pacemaker in place CAD: Volume status compensated, mild dehydration-hold off on IV fluids until symptomatic blood pressure somewhat soft.  Permanent A. Fib: currently no chest pain no angina.  Continue home Plavix. Hold torsemide  CKD stage IIIa: Baseline function 1.1-1.3 stable.  Uptrending from 1.0-1.3 with elevated BUN, increase oral intake, gentle IV fluids if not drinking well today Recent Labs  Lab 01/23/21 2200  01/23/21 2225 01/24/21 0730 01/26/21 1038  BUN 15 15 13  27*  CREATININE 1.14 1.10 1.04 1.33*     Prolonged QTC monitor  DVT prophylaxis: enoxaparin (LOVENOX) injection 40 mg Start: 01/24/21 2000 Code Status:   Code Status: Full Code Family Communication: plan of care discussed with patient, and son at bedside. Status is: Inpatient Remains inpatient appropriate because: Of ongoing pain management and need for skilled nursing facility pending PT OT and placement Disposition: Currently is not medically stable for discharge given episode of dizziness with soft blood pressure. Anticipated Disposition: SNF.  Facility has submitted insurance auth   Objective: Vitals last 24 hrs: Vitals:   01/25/21 2023 01/25/21 2051 01/26/21 0401 01/26/21 0940  BP: 94/63  97/65 (!) 93/58  Pulse: 81  80 75  Resp: 16  16 17   Temp: 97.7 F (36.5 C)  97.7 F (36.5 C) (!) 97.5 F (36.4 C)  TempSrc: Oral  Oral Oral  SpO2: (!) 89% 92% 90% 93%  Weight:      Height:       Weight change:   Intake/Output Summary (Last 24 hours) at 01/26/2021 1146 Last data filed at 01/26/2021 0402 Gross per 24 hour  Intake 60 ml  Output 100 ml  Net -40 ml    Net IO Since Admission: 70 mL [01/26/21 1146]   Physical Examination: General exam: AAOx3, pleasant, not in distress HEENT:Oral mucosa moist, Ear/Nose WNL grossly, dentition normal. Respiratory system: bilaterally diminished, no use of accessory muscle Cardiovascular system: S1 & S2 +, No JVD,. Gastrointestinal system: Abdomen soft,NT,ND, BS+ Nervous System:Alert, awake, moving extremities and grossly  nonfocal Extremities: No edema, distal peripheral pulses palpable.  Tender left hip area Skin: No rashes,no icterus. MSK: Normal muscle bulk,tone, power   Medications reviewed:  Scheduled Meds:  acetaminophen  1,000 mg Oral TID   brimonidine  1 drop Both Eyes BID   clopidogrel  75 mg Oral Daily   dorzolamide-timolol  1 drop Both Eyes BID   enoxaparin  (LOVENOX) injection  40 mg Subcutaneous Q24H   mupirocin ointment  1 application Nasal BID   pantoprazole  40 mg Oral Daily   polyethylene glycol  17 g Oral Daily   tamsulosin  0.4 mg Oral Daily   Continuous Infusions: Diet Order             Diet Heart Room service appropriate? Yes; Fluid consistency: Thin  Diet effective now                 Weight change:   Wt Readings from Last 3 Encounters:  01/23/21 81.6 kg  09/26/20 81.6 kg  05/13/20 73.5 kg     Consultants:see note  Procedures:see note Antimicrobials: Anti-infectives (From admission, onward)    None      Culture/Microbiology    Component Value Date/Time   SDES  05/16/2019 1356    URINE, CLEAN CATCH Performed at [facility], [place] [address] [place], [place] [address]    SPECREQUEST  05/16/2019 1356    NONE Performed at [facility], [place] [address], [place], [place] [address]    CULT >=100,000 COLONIES/mL SERRATIA MARCESCENS (A) 05/16/2019 1356   REPTSTATUS 05/19/2019 FINAL 05/16/2019 1356    Other culture-see note  Unresulted Labs (From admission, onward)     Start     Ordered   01/31/21 0500  Creatinine, serum  (enoxaparin (LOVENOX)    CrCl >/= 30 ml/min)  Weekly,   R     Comments: while on enoxaparin therapy    01/24/21 1546   01/26/21 0854  Resp Panel by RT-PCR (Flu A&B, Covid) Nasopharyngeal Swab  ONCE - STAT,   STAT        01/26/21 0854          Data Reviewed: I have personally reviewed following labs and imaging studies CBC: Recent Labs  Lab 01/23/21 2200 01/23/21 2225 01/24/21 0730 01/26/21 1038  WBC 6.6  --  7.7  --   HGB 13.0 13.3 12.3* 12.7*  HCT 40.0 39.0 36.4* 37.5*  MCV 94.8  --  93.6  --   PLT 125*  --  111*  --     Basic Metabolic Panel: Recent Labs  Lab 01/23/21 2200 01/23/21 2225 01/24/21 0730 01/26/21 1038  NA 137 136 136 135  K 4.0 4.0 3.6 4.2  CL 102 101 104 102  CO2 25  --  24 26  GLUCOSE 153* 147* 123* 134*  BUN  15 15 13  27*  CREATININE 1.14 1.10 1.04 1.33*  CALCIUM 8.6*  --  8.4* 8.8*    GFR: Estimated Creatinine Clearance: 34.3 mL/min (A) (by C-G formula based on SCr of 1.33 mg/dL (H)). Liver Function Tests: Recent Labs  Lab 01/23/21 2200  AST 23  ALT 18  ALKPHOS 84  BILITOT 0.6  PROT 5.5*  ALBUMIN 3.3*    No results for input(s): LIPASE, AMYLASE in the last 168 hours. No results for input(s): AMMONIA in the last 168 hours. Coagulation Profile: Recent Labs  Lab 01/23/21 2200  INR 1.1    Cardiac Enzymes: No results for input(s): CKTOTAL, CKMB,  CKMBINDEX, TROPONINI in the last 168 hours. BNP (last 3 results) No results for input(s): PROBNP in the last 8760 hours. HbA1C: No results for input(s): HGBA1C in the last 72 hours. CBG: No results for input(s): GLUCAP in the last 168 hours. Lipid Profile: No results for input(s): CHOL, HDL, LDLCALC, TRIG, CHOLHDL, LDLDIRECT in the last 72 hours. Thyroid Function Tests: No results for input(s): TSH, T4TOTAL, FREET4, T3FREE, THYROIDAB in the last 72 hours. Anemia Panel: No results for input(s): VITAMINB12, FOLATE, FERRITIN, TIBC, IRON, RETICCTPCT in the last 72 hours. Sepsis Labs: Recent Labs  Lab 01/23/21 2200 01/24/21 0730  LATICACIDVEN 2.3* 2.4*     Recent Results (from the past 240 hour(s))  Resp Panel by RT-PCR (Flu A&B, Covid) Nasopharyngeal Swab     Status: None   Collection Time: 01/23/21 10:00 PM   Specimen: Nasopharyngeal Swab; Nasopharyngeal(NP) swabs in vial transport medium  Result Value Ref Range Status   SARS Coronavirus 2 by RT PCR NEGATIVE NEGATIVE Final    Comment: (NOTE) SARS-CoV-2 target nucleic acids are NOT DETECTED.  The SARS-CoV-2 RNA is generally detectable in upper respiratory specimens during the acute phase of infection. The lowest concentration of SARS-CoV-2 viral copies this assay can detect is 138 copies/mL. A negative result does not preclude SARS-Cov-2 infection and should not be used as  the sole basis for treatment or other patient management decisions. A negative result may occur with  improper specimen collection/handling, submission of specimen other than nasopharyngeal swab, presence of viral mutation(s) within the areas targeted by this assay, and inadequate number of viral copies(<138 copies/mL). A negative result must be combined with clinical observations, patient history, and epidemiological information. The expected result is Negative.  Fact Sheet for Patients:  EntrepreneurPulse.com.au  Fact Sheet for Healthcare Providers:  IncredibleEmployment.be  This test is no t yet approved or cleared by the [place] FDA and  has been authorized for detection and/or diagnosis of SARS-CoV-2 by FDA under an Emergency Use Authorization (EUA). This EUA will remain  in effect (meaning this test can be used) for the duration of the COVID-19 declaration under Section 564(b)(1) of the Act, 21 U.S.C.section 360bbb-3(b)(1), unless the authorization is terminated  or revoked sooner.       Influenza A by PCR NEGATIVE NEGATIVE Final   Influenza B by PCR NEGATIVE NEGATIVE Final    Comment: (NOTE) The Xpert Xpress SARS-CoV-2/FLU/RSV plus assay is intended as an aid in the diagnosis of influenza from Nasopharyngeal swab specimens and should not be used as a sole basis for treatment. Nasal washings and aspirates are unacceptable for Xpert Xpress SARS-CoV-2/FLU/RSV testing.  Fact Sheet for Patients: EntrepreneurPulse.com.au  Fact Sheet for Healthcare Providers: IncredibleEmployment.be  This test is not yet approved or cleared by the [place] FDA and has been authorized for detection and/or diagnosis of SARS-CoV-2 by FDA under an Emergency Use Authorization (EUA). This EUA will remain in effect (meaning this test can be used) for the duration of the COVID-19 declaration under Section 564(b)(1) of  the Act, 21 U.S.C. section 360bbb-3(b)(1), unless the authorization is terminated or revoked.  Performed at [facility], [place] [address] [place], [place] [address]   Surgical PCR screen     Status: None   Collection Time: 01/24/21  3:44 PM   Specimen: Nasal Mucosa; Nasal Swab  Result Value Ref Range Status   MRSA, PCR NEGATIVE NEGATIVE Final   Staphylococcus aureus NEGATIVE NEGATIVE Final    Comment: (NOTE) The Xpert SA Assay (FDA  approved for NASAL specimens in patients 54 years of age and older), is one component of a comprehensive surveillance program. It is not intended to diagnose infection nor to guide or monitor treatment. Performed at [facility], [place] [address], [place], [place] [address]       Radiology Studies: No results found.   LOS: 2 days   Antonieta Pert, MD [facility]  01/26/2021, 11:46 AM

## 2021-01-26 NOTE — TOC Progression Note (Addendum)
Transition of Care [facility]) - Progression Note    Patient Details  Name: [name] MRN: [id number] Date of Birth: 10/16/1926  Transition of Care [facility]) CM/SW Contact  Joanne Chars, LCSW Phone Number: 01/26/2021, 8:36 AM  Clinical Narrative:   CSW  received call from [facility], pt daughter in law.  She asked about bed offers, asked specifically about [facility] and [facility].  CSW provided bed offers and she said they would chose [facility].  CSW spoke with [employer] and they do not manage this [facility] policy for SNF authorization.  0840: call back from [facility], she spoke with her husband and he wants to choose [facility].  Kitty at [facility] informed and will initiate auth.      Expected Discharge Plan: [place] Barriers to Discharge: SNF Pending bed offer, Continued Medical Work up  Expected Discharge Plan and Services Expected Discharge Plan: [place] In-house Referral: Clinical Social Work   Post Acute Care Choice: [place] Living arrangements for the past 2 months: Single Family Home                                       Social Determinants of Health (SDOH) Interventions    Readmission Risk Interventions No flowsheet data found.

## 2021-01-26 NOTE — Plan of Care (Signed)

## 2021-01-26 NOTE — Progress Notes (Signed)
Occupational Therapy Treatment Patient Details Name: [name] MRN: [id number] DOB: 09-04-1926 Today's Date: 01/26/2021   History of present illness 87 y.o. male who presents by EMS 01/23/21 after a fall at home. Found to have L periprosthetic hip fracture (non-op management) and cleared by ortho for WBAT. PMH significant for A-fib, heart failure, CAD, OA with DJD and bilateral hip and replacements, prostate cancer c/p resection, CKD, DDD, HTN bradycardia, L RTC surgery.   OT comments  Return to assist with transfer to recliner from [facility] after BM. Pt continues to requiring Min A for power up and weight shift forward with use of stedy. Cues for upright posture and anterior pelvic tilt in standing. Max A for peri care. Pt very appreciative. Continue to recommend dc to SNF and will continue to follow acutely as admitted.    Recommendations for follow up therapy are one component of a multi-disciplinary discharge planning process, led by the attending physician.  Recommendations may be updated based on patient status, additional functional criteria and insurance authorization.    Follow Up Recommendations  Skilled nursing-short term rehab (<3 hours/day)    Assistance Recommended at Discharge Frequent or constant Supervision/Assistance  Equipment Recommendations  Other (comment) (Defer to next venue)    Recommendations for Other Services PT consult    Precautions / Restrictions Precautions Precautions: Fall Precaution Comments: 2 falls in the past year, this one was in the bathroom "I lost my balance" Restrictions Weight Bearing Restrictions: Yes LLE Weight Bearing: Weight bearing as tolerated       Mobility Bed Mobility Overal bed mobility: Needs Assistance Bed Mobility: Supine to Sit     Supine to sit: Min assist;HOB elevated     General bed mobility comments: At [facility] upon arrival    Transfers Overall transfer level: Needs assistance Equipment used: Rolling walker (2  wheels) Transfers: Sit to/from Stand;Bed to chair/wheelchair/BSC Sit to Stand: Min assist;From elevated surface           General transfer comment: Min A and significant time for sit<>stand using RW. Cues for weight shift forward. Pt unable to tolerate marching in place at this time; use of sara stedy for pivot from [facility] to recliner Transfer via Lift Equipment: Stedy   Balance Overall balance assessment: Needs assistance Sitting-balance support: No upper extremity supported;Feet supported Sitting balance-Leahy Scale: Fair Sitting balance - Comments: sitting edge of stretcher with feet dangling initially min A for balance, then pt able to sit with UE support and s.   Standing balance support: Bilateral upper extremity supported;During functional activity;Reliant on assistive device for balance Standing balance-Leahy Scale: Poor Standing balance comment: needs support of RW and therapist, heavy reliance on bil UE to support him in standing. Performed 3 rounds of pre gait activities in standing standing a total of 2-3 mins each time.  Moving left leg forward and backward and then attempting to step forward with R LE.  He was never fully able to [facility] his left leg enough with his arms to take a step, but was able to pivot up the side of the bed on his right leg with support of therapist, RN and RW.                           ADL either performed or assessed with clinical judgement   ADL Overall ADL's : Needs assistance/impaired Eating/Feeding: Set up;Sitting   Grooming: Wash/dry face;Supervision/safety;Set up;Sitting   Upper Body Bathing: Supervision/ safety;Set up;Sitting  Lower Body Bathing: Maximal assistance;Sit to/from stand   Upper Body Dressing : Supervision/safety;Set up;Sitting   Lower Body Dressing: Maximal assistance;Sit to/from stand   Toilet Transfer: Minimal assistance;Stand-pivot;BSC/3in1 (use of stedy)   Toileting- Clothing Manipulation and Hygiene:  Maximal assistance;Sit to/from stand Toileting - Clothing Manipulation Details (indicate cue type and reason): Requiring assistance for peri care and maintaining balance     Functional mobility during ADLs: Minimal assistance (sit<>stand with stedy) General ADL Comments: Returned for second session to assist wiht oob toilet nad back to bed. Pt demonstrating fatigue and rwuqiring increased cues for upright posture.    Extremity/Trunk Assessment Upper Extremity Assessment Upper Extremity Assessment: LUE deficits/detail LUE Deficits / Details: L elbow arthritic pain so limits flexion/extension   Lower Extremity Assessment Lower Extremity Assessment: Defer to PT evaluation LLE Deficits / Details: AAROM about 30-40 degrees flexion in supine, knee extension strength 3+/5, ankle DF WFL   Cervical / Trunk Assessment Cervical / Trunk Assessment: Kyphotic    Vision Baseline Vision/History: 1 Wears glasses Patient Visual Report: No change from baseline     Perception     Praxis      Cognition Arousal/Alertness: Awake/alert Behavior During Therapy: WFL for tasks assessed/performed Overall Cognitive Status: Within Functional Limits for tasks assessed                                 General Comments: HOH which requires frequent repetition          Exercises     Shoulder Instructions       General Comments RN present to observe how he performs sit<>stand with stedy    Pertinent Vitals/ Pain       Pain Assessment: Faces Faces Pain Scale: Hurts even more Pain Location: L hip with movement Pain Descriptors / Indicators: Grimacing;Guarding;Moaning;Aching Pain Intervention(s): Monitored during session;Limited activity within patient's tolerance;Repositioned  Home Living Family/patient expects to be discharged to:: Skilled nursing facility Living Arrangements: Other relatives (Caregiver 24/7)                               Additional Comments: Home with  caregiver.      Prior Functioning/Environment              Frequency  Min 2X/week        Progress Toward Goals  OT Goals(current goals can now be found in the care plan section)  Progress towards OT goals: Progressing toward goals  Acute Rehab OT Goals Patient Stated Goal: "I want to be able to get out of this bed and walk" OT Goal Formulation: With patient Time For Goal Achievement: 02/09/21 Potential to Achieve Goals: Good ADL Goals Pt Will Perform Grooming: with min assist;standing Pt Will Perform Lower Body Dressing: with mod assist;sit to/from stand;with adaptive equipment Pt Will Transfer to Toilet: with min assist;stand pivot transfer;bedside commode Pt Will Perform Toileting - Clothing Manipulation and hygiene: with min guard assist;sitting/lateral leans  Plan Discharge plan remains appropriate    Co-evaluation                 AM-PAC OT "6 Clicks" Daily Activity     Outcome Measure   Help from another person eating meals?: None Help from another person taking care of personal grooming?: A Little Help from another person toileting, which includes using toliet, bedpan, or urinal?: A Little Help from another person bathing (  including washing, rinsing, drying)?: A Lot Help from another person to put on and taking off regular upper body clothing?: A Little Help from another person to put on and taking off regular lower body clothing?: A Lot 6 Click Score: 17    End of Session Equipment Utilized During Treatment: Gait belt;Rolling walker (2 wheels)  OT Visit Diagnosis: Unsteadiness on feet (R26.81);Other abnormalities of gait and mobility (R26.89);Muscle weakness (generalized) (M62.81);Pain Pain - Right/Left: Left Pain - part of body: Leg   Activity Tolerance Patient tolerated treatment well   Patient Left with call bell/phone within reach [facility])   Nurse Communication Mobility status        Time: 0905-0256 OT Time Calculation (min): 17  min  Charges: OT General Charges $OT Visit: 1 Visit OT Evaluation $OT Eval Moderate Complexity: 1 Mod OT Treatments $Self Care/Home Management : 8-22 mins  Deneen Slager MSOT, OTR/L Acute Rehab Pager: [phone] Office: [place] 01/26/2021, 10:18 AM

## 2021-01-26 NOTE — Evaluation (Addendum)
Occupational Therapy Evaluation Patient Details Name: [name] MRN: [id number] DOB: [birth year] Today's Date: 01/26/2021   History of Present Illness 87 y.o. male who presents by EMS 01/23/21 after a fall at home. Found to have L periprosthetic hip fracture (non-op management) and cleared by ortho for WBAT. PMH significant for A-fib, heart failure, CAD, OA with DJD and bilateral hip and replacements, prostate cancer c/p resection, CKD, DDD, HTN bradycardia, L RTC surgery.   Clinical Impression   PTA, pt was living with a caregiver (24/7) and was independent with BADLs and using a rollator for mobility. Pt currently requiring Max A for LB ADLs and Min A for sit<>stand.  Use of stedy to transfer to [facility] as pt with decreased tolerance of WB on LLE. Pt would benefit from further acute OT to facilitate safe dc. Recommend dc to SNF for further OT to optimize safety, independence with ADLs, and return to PLOF.      Recommendations for follow up therapy are one component of a multi-disciplinary discharge planning process, led by the attending physician.  Recommendations may be updated based on patient status, additional functional criteria and insurance authorization.   Follow Up Recommendations  Skilled nursing-short term rehab (<3 hours/day)    Assistance Recommended at Discharge Frequent or constant Supervision/Assistance  Functional Status Assessment  Patient has had a recent decline in their functional status and demonstrates the ability to make significant improvements in function in a reasonable and predictable amount of time.  Equipment Recommendations  Other (comment) (Defer to next venue)    Recommendations for Other Services PT consult     Precautions / Restrictions Precautions Precautions: Fall Precaution Comments: 2 falls in the past year, this one was in the bathroom "I lost my balance" Restrictions Weight Bearing Restrictions: Yes LLE Weight Bearing: Weight bearing as  tolerated      Mobility Bed Mobility Overal bed mobility: Needs Assistance Bed Mobility: Supine to Sit     Supine to sit: Min assist;HOB elevated     General bed mobility comments: Pt able to bring BLEs and hips over to EOB. use of HOB to elevate trunk. Min A for pt to hold therapists hand and pull into sitting postiion    Transfers Overall transfer level: Needs assistance Equipment used: Rolling walker (2 wheels) Transfers: Sit to/from Stand;Bed to chair/wheelchair/BSC Sit to Stand: Min assist;From elevated surface           General transfer comment: Min A and significant time for sit<>stand using RW. Cues for weight shift forward. Pt unable to tolerate marching in place at this time; use of sara stedy for pivot to [facility] Transfer via Lift Equipment: Stedy    Balance Overall balance assessment: Needs assistance Sitting-balance support: No upper extremity supported;Feet supported Sitting balance-Leahy Scale: Fair     Standing balance support: Bilateral upper extremity supported;During functional activity;Reliant on assistive device for balance Standing balance-Leahy Scale: Poor                             ADL either performed or assessed with clinical judgement   ADL Overall ADL's : Needs assistance/impaired Eating/Feeding: Set up;Sitting   Grooming: Wash/dry face;Supervision/safety;Set up;Sitting   Upper Body Bathing: Supervision/ safety;Set up;Sitting   Lower Body Bathing: Maximal assistance;Sit to/from stand   Upper Body Dressing : Supervision/safety;Set up;Sitting   Lower Body Dressing: Maximal assistance;Sit to/from stand   Toilet Transfer: Minimal assistance;Stand-pivot;BSC/3in1 (use of stedy)   ToiletingRunner, broadcasting/film/video  and Hygiene: Maximal assistance;Sit to/from stand Toileting - Clothing Manipulation Details (indicate cue type and reason): Requiring assistance for peri care and maintaining balance     Functional mobility during ADLs:  Minimal assistance (sit<>stand with stedy) General ADL Comments: Pt presenting with decreased balance, strength, and weight bearing tolerance due to pain.     Vision Baseline Vision/History: 1 Wears glasses Patient Visual Report: No change from baseline       Perception     Praxis      Pertinent Vitals/Pain Pain Assessment: Faces Faces Pain Scale: Hurts even more Pain Location: L hip with movement Pain Descriptors / Indicators: Grimacing;Guarding;Moaning;Aching Pain Intervention(s): Monitored during session;Limited activity within patient's tolerance;Repositioned     Hand Dominance     Extremity/Trunk Assessment Upper Extremity Assessment Upper Extremity Assessment: LUE deficits/detail LUE Deficits / Details: L elbow arthritic pain so limits flexion/extension   Lower Extremity Assessment Lower Extremity Assessment: Defer to PT evaluation   Cervical / Trunk Assessment Cervical / Trunk Assessment: Kyphotic   Communication Communication Communication: HOH (L hearing aide)   Cognition Arousal/Alertness: Awake/alert Behavior During Therapy: WFL for tasks assessed/performed Overall Cognitive Status: Within Functional Limits for tasks assessed                                 General Comments: HOH which requires frequent repetition     General Comments       Exercises     Shoulder Instructions      Home Living Family/patient expects to be discharged to:: Skilled nursing facility Living Arrangements: Other relatives (Caregiver 24/7)                               Additional Comments: Home with caregiver.      Prior Functioning/Environment Prior Level of Function : Independent/Modified Independent;Needs assist             Mobility Comments: walked with rollator in the home ADLs Comments: caregiver assisted with meals and housekeeping, driving, etc; pt showered in walk in shower with seat and grab bars independent.        OT  Problem List: Decreased strength;Decreased range of motion;Decreased activity tolerance;Impaired balance (sitting and/or standing);Decreased knowledge of use of DME or AE;Decreased knowledge of precautions      OT Treatment/Interventions: Self-care/ADL training;Therapeutic exercise;Energy conservation;DME and/or AE instruction;Therapeutic activities;Patient/family education    OT Goals(Current goals can be found in the care plan section) Acute Rehab OT Goals Patient Stated Goal: "I want to be able to get out of this bed and walk" OT Goal Formulation: With patient Time For Goal Achievement: 02/09/21 Potential to Achieve Goals: Good  OT Frequency: Min 2X/week   Barriers to D/C:            Co-evaluation              AM-PAC OT "6 Clicks" Daily Activity     Outcome Measure Help from another person eating meals?: None Help from another person taking care of personal grooming?: A Little Help from another person toileting, which includes using toliet, bedpan, or urinal?: A Little Help from another person bathing (including washing, rinsing, drying)?: A Lot Help from another person to put on and taking off regular upper body clothing?: A Little Help from another person to put on and taking off regular lower body clothing?: A Lot 6 Click Score: 17  End of Session Equipment Utilized During Treatment: Gait belt;Rolling walker (2 wheels) Nurse Communication: Mobility status  Activity Tolerance: Patient tolerated treatment well Patient left: with call bell/phone within reach [facility])  OT Visit Diagnosis: Unsteadiness on feet (R26.81);Other abnormalities of gait and mobility (R26.89);Muscle weakness (generalized) (M62.81);Pain Pain - Right/Left: Left Pain - part of body: Leg                Time: 3500-9381 OT Time Calculation (min): 25 min Charges:  OT General Charges $OT Visit: 1 Visit OT Evaluation $OT Eval Moderate Complexity: 1 Mod OT Treatments $Self Care/Home Management : 8-22  mins  Edyth Glomb MSOT, OTR/L Acute Rehab Pager: [phone] Office: [place] 01/26/2021, 8:50 AM

## 2021-01-26 NOTE — Progress Notes (Signed)
Physical Therapy Treatment Patient Details Name: [name] MRN: [id number] DOB: [birth year] Today's Date: 01/26/2021   History of Present Illness 87 y.o. male who presents by EMS 01/23/21 after a fall at home. Found to have L periprosthetic hip fracture (non-op management) and cleared by ortho for WBAT. PMH significant for A-fib, heart failure, CAD, OA with DJD and bilateral hip and replacements, prostate cancer c/p resection, CKD, DDD, HTN bradycardia, L RTC surgery.    PT Comments    Pt was able to progress to OOB to chair mobility today with two person assist and RW.  He remains unable to take steps during mobility with the RW, however, needed less assist to get to the side of the bed.  PT will continue to follow acutely for safe mobility progression.  We will continue to attempt to take steps and progress gait away from the bed.  Lower RW next session (it is a bit high and I forgot to lower it).  Recommendations for follow up therapy are one component of a multi-disciplinary discharge planning process, led by the attending physician.  Recommendations may be updated based on patient status, additional functional criteria and insurance authorization.  Follow Up Recommendations  Skilled nursing-short term rehab (<3 hours/day)     Assistance Recommended at Discharge Frequent or constant Supervision/Assistance  Equipment Recommendations  Wheelchair (measurements PT);Wheelchair cushion (measurements PT);Hospital bed    Recommendations for Other Services       Precautions / Restrictions Precautions Precautions: Fall Precaution Comments: 2 falls in the past year, this one was in the bathroom "I lost my balance" Restrictions LLE Weight Bearing: Weight bearing as tolerated     Mobility  Bed Mobility Overal bed mobility: Needs Assistance Bed Mobility: Supine to Sit     Supine to sit: Min assist;HOB elevated     General bed mobility comments: Min assist to support trunk to come  up to sitting from elevated HOB.  Pt able to progress bil legs over the side and used rail and HOB advantage to start to pull up.  Min assist provided at trunk and extra time so he could do as much on his own as possible.  He was able to move his left leg over the side of the bed without therapist helping him today which he could not do yesterday.    Transfers Overall transfer level: Needs assistance Equipment used: Rolling walker (2 wheels) Transfers: Sit to/from Stand;Bed to chair/wheelchair/BSC Sit to Stand: Mod assist;+2 physical assistance;From elevated surface     Step pivot transfers: Mod assist;+2 physical assistance;From elevated surface     General transfer comment: Two person mod assist to come to standing from elevated bed, cues for safe hand placement during transitions. Pt pivoted on his right foot to the recliner on the R with RW.  He was unable to unweight L foot enough to step due to pain in WB.    Ambulation/Gait                   Stairs             Wheelchair Mobility    Modified Rankin (Stroke Patients Only)       Balance Overall balance assessment: Needs assistance Sitting-balance support: Feet supported;Bilateral upper extremity supported Sitting balance-Leahy Scale: Fair Sitting balance - Comments: supervision EOB, able to remove hands, but seems to prefer them for support in sitting.   Standing balance support: Bilateral upper extremity supported Standing balance-Leahy Scale: Poor Standing balance comment:  needs support from RW and therapists.  Was able to stand for 2-3 mins while BP took (we took orthostatics).                            Cognition Arousal/Alertness: Awake/alert Behavior During Therapy: WFL for tasks assessed/performed Overall Cognitive Status: Within Functional Limits for tasks assessed                                 General Comments: There is some pauses in processing at times, however, he  is generally chatty and reports he has no hearing in his R ear (and wears a hearing aide in the left).  He is likely near his baseline.        Exercises General Exercises - Lower Extremity Long Arc Quad: AROM;Both;10 reps;Seated Hip Flexion/Marching: AROM;Both;10 reps;Seated Toe Raises: AROM;Both;10 reps;Seated Heel Raises: AROM;Both;10 reps;Seated    General Comments        Pertinent Vitals/Pain Pain Assessment: Faces Faces Pain Scale: Hurts whole lot Pain Location: L hip with movement and weight bearing Pain Intervention(s): Limited activity within patient's tolerance;Monitored during session;Repositioned;Premedicated before session    Home Living                          Prior Function            PT Goals (current goals can now be found in the care plan section) Acute Rehab PT Goals Patient Stated Goal: agreeable to get up with PT Progress towards PT goals: Progressing toward goals    Frequency    Min 2X/week      PT Plan Current plan remains appropriate    Co-evaluation              AM-PAC PT "6 Clicks" Mobility   Outcome Measure  Help needed turning from your back to your side while in a flat bed without using bedrails?: A Little Help needed moving from lying on your back to sitting on the side of a flat bed without using bedrails?: A Little Help needed moving to and from a bed to a chair (including a wheelchair)?: A Lot Help needed standing up from a chair using your arms (e.g., wheelchair or bedside chair)?: A Lot Help needed to walk in hospital room?: Total Help needed climbing 3-5 steps with a railing? : Total 6 Click Score: 12    End of Session Equipment Utilized During Treatment: Gait belt Activity Tolerance: No increased pain Patient left: in chair;with call bell/phone within reach Nurse Communication: Mobility status;Need for lift equipment (use stedy for back to bed) PT Visit Diagnosis: History of falling (Z91.81);Other  abnormalities of gait and mobility (R26.89);Pain Pain - Right/Left: Left Pain - part of body: Hip     Time: 0017-4944 PT Time Calculation (min) (ACUTE ONLY): 40 min  Charges:  $Therapeutic Exercise: 8-22 mins $Therapeutic Activity: 23-37 mins                     Verdene Lennert, PT, DPT  Acute Rehabilitation Ortho Tech Supervisor [phone] pager [phone]) [phone] office

## 2021-01-27 DIAGNOSIS — R404 Transient alteration of awareness: Secondary | ICD-10-CM | POA: Diagnosis not present

## 2021-01-27 DIAGNOSIS — M9702XA Periprosthetic fracture around internal prosthetic left hip joint, initial encounter: Secondary | ICD-10-CM | POA: Diagnosis not present

## 2021-01-27 DIAGNOSIS — R4182 Altered mental status, unspecified: Secondary | ICD-10-CM | POA: Diagnosis not present

## 2021-01-27 DIAGNOSIS — I959 Hypotension, unspecified: Secondary | ICD-10-CM | POA: Diagnosis not present

## 2021-01-27 DIAGNOSIS — L89612 Pressure ulcer of right heel, stage 2: Secondary | ICD-10-CM | POA: Diagnosis not present

## 2021-01-27 DIAGNOSIS — R130 Aphagia: Secondary | ICD-10-CM | POA: Diagnosis not present

## 2021-01-27 DIAGNOSIS — R059 Cough, unspecified: Secondary | ICD-10-CM | POA: Diagnosis not present

## 2021-01-27 DIAGNOSIS — N1831 Chronic kidney disease, stage 3a: Secondary | ICD-10-CM | POA: Diagnosis not present

## 2021-01-27 DIAGNOSIS — Z8719 Personal history of other diseases of the digestive system: Secondary | ICD-10-CM | POA: Diagnosis not present

## 2021-01-27 DIAGNOSIS — E441 Mild protein-calorie malnutrition: Secondary | ICD-10-CM | POA: Diagnosis not present

## 2021-01-27 DIAGNOSIS — Z7401 Bed confinement status: Secondary | ICD-10-CM | POA: Diagnosis not present

## 2021-01-27 DIAGNOSIS — I2510 Atherosclerotic heart disease of native coronary artery without angina pectoris: Secondary | ICD-10-CM | POA: Diagnosis not present

## 2021-01-27 DIAGNOSIS — I25118 Atherosclerotic heart disease of native coronary artery with other forms of angina pectoris: Secondary | ICD-10-CM | POA: Diagnosis not present

## 2021-01-27 DIAGNOSIS — M25552 Pain in left hip: Secondary | ICD-10-CM | POA: Diagnosis not present

## 2021-01-27 DIAGNOSIS — I5042 Chronic combined systolic (congestive) and diastolic (congestive) heart failure: Secondary | ICD-10-CM | POA: Diagnosis not present

## 2021-01-27 DIAGNOSIS — I4821 Permanent atrial fibrillation: Secondary | ICD-10-CM | POA: Diagnosis not present

## 2021-01-27 DIAGNOSIS — L89611 Pressure ulcer of right heel, stage 1: Secondary | ICD-10-CM | POA: Diagnosis not present

## 2021-01-27 DIAGNOSIS — Z9181 History of falling: Secondary | ICD-10-CM | POA: Diagnosis not present

## 2021-01-27 DIAGNOSIS — R41841 Cognitive communication deficit: Secondary | ICD-10-CM | POA: Diagnosis not present

## 2021-01-27 DIAGNOSIS — Z96642 Presence of left artificial hip joint: Secondary | ICD-10-CM | POA: Diagnosis not present

## 2021-01-27 DIAGNOSIS — W19XXXA Unspecified fall, initial encounter: Secondary | ICD-10-CM | POA: Diagnosis not present

## 2021-01-27 DIAGNOSIS — R1310 Dysphagia, unspecified: Secondary | ICD-10-CM | POA: Diagnosis not present

## 2021-01-27 DIAGNOSIS — M9702XS Periprosthetic fracture around internal prosthetic left hip joint, sequela: Secondary | ICD-10-CM | POA: Diagnosis not present

## 2021-01-27 DIAGNOSIS — M9702XD Periprosthetic fracture around internal prosthetic left hip joint, subsequent encounter: Secondary | ICD-10-CM | POA: Diagnosis not present

## 2021-01-27 DIAGNOSIS — C61 Malignant neoplasm of prostate: Secondary | ICD-10-CM | POA: Diagnosis not present

## 2021-01-27 DIAGNOSIS — S72041A Displaced fracture of base of neck of right femur, initial encounter for closed fracture: Secondary | ICD-10-CM | POA: Diagnosis not present

## 2021-01-27 DIAGNOSIS — M6281 Muscle weakness (generalized): Secondary | ICD-10-CM | POA: Diagnosis not present

## 2021-01-27 DIAGNOSIS — S72302A Unspecified fracture of shaft of left femur, initial encounter for closed fracture: Secondary | ICD-10-CM | POA: Diagnosis not present

## 2021-01-27 LAB — BASIC METABOLIC PANEL
Anion gap: 6 (ref 5–15)
BUN: 31 mg/dL — ABNORMAL HIGH (ref 8–23)
CO2: 26 mmol/L (ref 22–32)
Calcium: 8.5 mg/dL — ABNORMAL LOW (ref 8.9–10.3)
Chloride: 99 mmol/L (ref 98–111)
Creatinine, Ser: 1.2 mg/dL (ref 0.61–1.24)
GFR, Estimated: 57 mL/min — ABNORMAL LOW (ref >60)
Glucose, Bld: 132 mg/dL — ABNORMAL HIGH (ref 70–99)
Potassium: 4.2 mmol/L (ref 3.5–5.1)
Sodium: 131 mmol/L — ABNORMAL LOW (ref 135–145)

## 2021-01-27 MED ORDER — ACETAMINOPHEN 500 MG PO TABS: 1000.0000 mg | ORAL_TABLET | Freq: Three times a day (TID) | ORAL | 0 refills | Status: DC | PRN

## 2021-01-27 MED ORDER — POLYETHYLENE GLYCOL 3350 17 G PO PACK: 17.0000 g | PACK | Freq: Every day | ORAL | 0 refills | Status: DC | PRN

## 2021-01-27 MED ORDER — DIAZEPAM 5 MG PO TABS: 5.0000 mg | ORAL_TABLET | Freq: Two times a day (BID) | ORAL | 0 refills | Status: DC | PRN

## 2021-01-27 MED ORDER — TRAMADOL HCL 50 MG PO TABS
50.0000 mg | ORAL_TABLET | Freq: Four times a day (QID) | ORAL | 0 refills | Status: DC | PRN
Start: 2021-01-27 — End: 2021-01-31

## 2021-01-27 NOTE — Progress Notes (Signed)
Patient discharged with PTAR at this time. Patient hemodynamically stable during discharge.

## 2021-01-27 NOTE — Progress Notes (Signed)
Physical Therapy Treatment Patient Details Name: [name] MRN: [id number] DOB: [birth year] Today's Date: 01/27/2021   History of Present Illness 87 y.o. male who presents by EMS 01/23/21 after a fall at home. Found to have L periprosthetic hip fracture (non-op management) and cleared by ortho for WBAT. PMH significant for A-fib, heart failure, CAD, OA with DJD and bilateral hip and replacements, prostate cancer c/p resection, CKD, DDD, HTN bradycardia, L RTC surgery.    PT Comments    Pt is progressing well with mobility, standing several times today with RW. He was too painful to progress with gait away from the bed, but did quite a bit of mobility to get up and then to toilet today.  PT to follow acutely until d/c confirmed.     Recommendations for follow up therapy are one component of a multi-disciplinary discharge planning process, led by the attending physician.  Recommendations may be updated based on patient status, additional functional criteria and insurance authorization.  Follow Up Recommendations  Skilled nursing-short term rehab (<3 hours/day)     Assistance Recommended at Discharge Frequent or constant Supervision/Assistance  Equipment Recommendations  Wheelchair (measurements PT);Wheelchair cushion (measurements PT);Hospital bed    Recommendations for Other Services       Precautions / Restrictions Precautions Precautions: Fall Precaution Comments: 2 falls in the past year, this one was in the bathroom "I lost my balance", cannot hear out of his R ear, so stand on the L for better comprehension. Restrictions LLE Weight Bearing: Weight bearing as tolerated     Mobility  Bed Mobility Overal bed mobility: Needs Assistance Bed Mobility: Supine to Sit     Supine to sit: Min assist;HOB elevated     General bed mobility comments: Min assist to come to sitting EOB, hand held assist and rail used to elevate trunk, pt progressed bil legs over without assist.     Transfers Overall transfer level: Needs assistance Equipment used: Rolling walker (2 wheels) Transfers: Sit to/from Stand;Bed to chair/wheelchair/BSC Sit to Stand: Mod assist;+2 physical assistance;From elevated surface     Step pivot transfers: Mod assist;+2 physical assistance;From elevated surface     General transfer comment: Two person mod assist to come to standing from elevated bed, cues for safe hand placement during transitions. Pt pivoted on his right foot to the recliner on the R with RW.  He was unable to unweight L foot enough to step due to pain in WB. After settling in the chair he requested to use the restroom, so we stood, traded the chair out for the [facility] and did that in reverse after pericare.    Ambulation/Gait               General Gait Details: still unable to take steps forward away from the bed.  He has increased L hip pain today, possibly because he was up OOB multiple times yesterday.   Stairs             Wheelchair Mobility    Modified Rankin (Stroke Patients Only)       Balance Overall balance assessment: Needs assistance Sitting-balance support: Feet supported;No upper extremity supported Sitting balance-Leahy Scale: Fair     Standing balance support: Bilateral upper extremity supported Standing balance-Leahy Scale: Poor                              Cognition Arousal/Alertness: Awake/alert Behavior During Therapy: WFL for tasks assessed/performed Overall  Cognitive Status: Within Functional Limits for tasks assessed                                 General Comments: stand on L for better comprehension, R 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        Exercises General Exercises - Upper Extremity Elbow Flexion: AROM;Both;10 reps Elbow Extension: AROM;Both;10 reps General Exercises - Lower Extremity Long Arc Quad: AROM;Both;10 reps Hip Flexion/Marching: AROM;Both;10 reps Toe Raises: AROM;Both;10 reps Heel Raises:  AROM;Both;10 reps    General Comments General comments (skin integrity, edema, etc.): Used music to help with exercises, he likes "old country" so we played Magazine features editor      Pertinent Vitals/Pain Pain Assessment: Faces Faces Pain Scale: Hurts whole lot Pain Location: L hip with movement and weight bearing Pain Descriptors / Indicators: Grimacing;Guarding;Moaning;Aching Pain Intervention(s): Limited activity within patient's tolerance;Monitored during session;Repositioned;Premedicated before session    Home Living                          Prior Function            PT Goals (current goals can now be found in the care plan section) Acute Rehab PT Goals Patient Stated Goal: agreeable to get up with PT Progress towards PT goals: Progressing toward goals    Frequency    Min 2X/week      PT Plan Current plan remains appropriate    Co-evaluation              AM-PAC PT "6 Clicks" Mobility   Outcome Measure  Help needed turning from your back to your side while in a flat bed without using bedrails?: A Little Help needed moving from lying on your back to sitting on the side of a flat bed without using bedrails?: A Little Help needed moving to and from a bed to a chair (including a wheelchair)?: A Lot Help needed standing up from a chair using your arms (e.g., wheelchair or bedside chair)?: A Lot Help needed to walk in hospital room?: Total Help needed climbing 3-5 steps with a railing? : Total 6 Click Score: 12    End of Session Equipment Utilized During Treatment: Gait belt Activity Tolerance: Patient limited by fatigue;Patient limited by pain Patient left: in chair;with family/visitor present (his caregiver, Arbie Cookey, was present) Nurse Communication: Mobility status;Need for lift equipment;Other (comment) (use the stedy for back to bed.) PT Visit Diagnosis: History of falling (Z91.81);Other abnormalities of gait and mobility (R26.89);Pain Pain - Right/Left:  Left Pain - part of body: Hip     Time: 6945-0388 PT Time Calculation (min) (ACUTE ONLY): 46 min  Charges:  $Therapeutic Exercise: 8-22 mins $Therapeutic Activity: 23-37 mins                    Verdene Lennert, PT, DPT  Acute Rehabilitation Ortho Tech Supervisor [phone] pager [phone]) [phone] office

## 2021-01-27 NOTE — Discharge Summary (Signed)
Physician Discharge Summary  [name] IDP:[id number] DOB: [birth year] DOA: 01/23/2021  PCP: Lawerance Cruel, MD  Admit date: 01/23/2021 Discharge date: 01/27/2021  Admitted From: home Disposition:  SNF  Recommendations for Outpatient Follow-up:  Follow up with PCP AND ORTHOPEDICS in 1-2 weeks Please obtain BMP/CBC in one week Please follow up on the following pending results:  Home Health:NO  Equipment/Devices: NONE  Discharge Condition: Stable Code Status:   Code Status: Full Code Diet recommendation:  Diet Order             Diet Heart Room service appropriate? Yes; Fluid consistency: Thin  Diet effective now                    Brief/Interim Summary: 87 y.o. male with PMH of A. fib not on anticoagulation, CHF with EF 30-35%, history of heart block status post BiV pacer, CAD last stent 2015, CKD 3A baseline creatinine 1.1-1.3, prostate cancer presents to the ED after a fall felt a mechanical fall at home when he slipped and found to have severe left thigh pain and could not stand In the ED had multiple x-rays and scans including chest x-ray pelvis x-ray CT chest abdomen pelvis, CT C-spine, CT left hip CT head, found to have periprosthetic hip fracture orthopedic was consulted recommended WBAT, nonoperative management and follow-up with Dr. Lorre Nick in 1 to 2 weeks Patient was seen by PT OT has advised skilled nursing facility, continue pain control.  He was gently hydrated for risk of dehydration slight bump in creatinine given his dizziness.  At this time is hemodynamically stable feels well and stable for discharge to facility.  Discharge Diagnoses:   Periprosthetic fracture around internal prosthetic left hip joint: Secondary to mechanical fall, WBAT, orthopedic consult appreciated plan is for nonoperative management pain control PT OT and skilled nursing facility placement.  Add IV Toradol as pain is poorly controlled on oral oxygen.  TOC working on a skilled nursing  facility has a bed today but patient is reluctant for going home given his dizziness and soft BP.  Outpatient follow-up with Dr. Reynaldo Minium in 1 to 2-week  Chronic combined systolic and diastolic CHF BiV cardiac pacemaker in place CAD: Volume status stable alert gentle IV fluid hydration for dizziness.  Patient can resume his torsemide potassium supplement also with Plavix.   Permanent A. Fib: currently no chest pain no angina.  Continue home Plavix.   CKD stage IIIa: Baseline function 1.1-1.3 stable.  Uptrending from 1.3 with elevated BUN given IV fluids and improved.  Check BMP in 1 week.  Continue torsemide.  Consults: Orthopedics  Subjective: Alert awake resting comfortably no new complaints. Discharge Exam: Vitals:   01/27/21 0433 01/27/21 0812  BP: 101/60 107/78  Pulse: 78 80  Resp: 16 17  Temp: 97.6 F (36.4 C) 97.7 F (36.5 C)  SpO2: 94% 95%   General: Pt is alert, awake, not in acute distress Cardiovascular: RRR, S1/S2 +, no rubs, no gallops Respiratory: CTA bilaterally, no wheezing, no rhonchi Abdominal: Soft, NT, ND, bowel sounds + Extremities: no edema, no cyanosis  Discharge Instructions  Discharge Instructions     Discharge instructions   Complete by: As directed    Please call call MD or return to ER for similar or worsening recurring problem that brought you to hospital or if any fever,nausea/vomiting,abdominal pain, uncontrolled pain, chest pain,  shortness of breath or any other alarming symptoms.  PCP follow-up in 1 week Orthopedic follow-up in 1-2 w  Cbc/bmp in 1 wk   Please follow-up your doctor as instructed in a week time and call the office for appointment.  Please avoid alcohol, smoking, or any other illicit substance and maintain healthy habits including taking your regular medications as prescribed.  You were cared for by a hospitalist during your hospital stay. If you have any questions about your discharge medications or the care you received  while you were in the hospital after you are discharged, you can call the unit and ask to speak with the hospitalist on call if the hospitalist that took care of you is not available.  Once you are discharged, your primary care physician will handle any further medical issues. Please note that NO REFILLS for any discharge medications will be authorized once you are discharged, as it is imperative that you return to your primary care physician (or establish a relationship with a primary care physician if you do not have one) for your aftercare needs so that they can reassess your need for medications and monitor your lab values   Increase activity slowly   Complete by: As directed       Allergies as of 01/27/2021       Reactions   Gabapentin    Other reaction(s): mood changes   Torsemide    Codeine Anxiety, Other (See Comments)   Insomnia/ hyper   Omeprazole Nausea And Vomiting, Other (See Comments)   GI upset, insomnia   Warfarin Sodium Anxiety, Other (See Comments)   Hx GI bleed        Medication List     STOP taking these medications    amoxicillin 500 MG tablet Commonly known as: AMOXIL   bacitracin-polymyxin b ophthalmic ointment Commonly known as: POLYSPORIN   buPROPion 150 MG 24 hr tablet Commonly known as: WELLBUTRIN XL   gatifloxacin 0.5 % Soln Commonly known as: ZYMAXID   latanoprost 0.005 % ophthalmic solution Commonly known as: XALATAN   prednisoLONE acetate 1 % ophthalmic suspension Commonly known as: PRED FORTE   psyllium 58.6 % packet Commonly known as: METAMUCIL       TAKE these medications    acetaminophen 500 MG tablet Commonly known as: TYLENOL Take 2 tablets (1,000 mg total) by mouth every 8 (eight) hours as needed.   brimonidine 0.1 % Soln Commonly known as: ALPHAGAN P Place 1 drop into both eyes 2 (two) times daily.   clopidogrel 75 MG tablet Commonly known as: PLAVIX TAKE 1 TABLET EVERY DAY   cyclobenzaprine 5 MG tablet Commonly  known as: FLEXERIL Take 2.5 mg by mouth 3 (three) times daily as needed for muscle spasms.   diazepam 5 MG tablet Commonly known as: VALIUM Take 1 tablet (5 mg total) by mouth every 12 (twelve) hours as needed for up to 2 doses for anxiety.   dorzolamide-timolol 22.3-6.8 MG/ML ophthalmic solution Commonly known as: COSOPT Place 1 drop into both eyes 2 (two) times daily.   esomeprazole 40 MG capsule Commonly known as: NEXIUM Take 40 mg by mouth 2 (two) times daily.   lactose free nutrition Liqd Take 237 mLs by mouth daily. vanilla   METAMUCIL FIBER PO Take 1 Scoop by mouth in the morning and at bedtime.   nitroGLYCERIN 0.4 MG SL tablet Commonly known as: Nitrostat Place 1 tablet (0.4 mg total) under the tongue every 5 (five) minutes as needed for chest pain.   OVER THE COUNTER MEDICATION Take 50 mg by mouth in the morning and at bedtime. Hemp capsule  OVER THE COUNTER MEDICATION Apply 1 application topically at bedtime. Arthritis pain relief to left elbow   pantoprazole 40 MG tablet Commonly known as: PROTONIX Take 1 tablet by mouth daily.   polyethylene glycol 17 g packet Commonly known as: MIRALAX / GLYCOLAX Take 17 g by mouth daily as needed.   potassium chloride 10 MEQ tablet Commonly known as: KLOR-CON M TAKE 1 TABLET EVERY DAY AS NEEDED. WHEN YOU TAKE YOUR FUROSEMIDE. What changed: See the new instructions.   PROBIOTIC PO Take 1 tablet by mouth daily. ALIGN   tamsulosin 0.4 MG Caps capsule Commonly known as: FLOMAX Take 0.4 mg by mouth daily.   torsemide 20 MG tablet Commonly known as: DEMADEX Take one extra tablet of torsemide by mouth if weight gain of 3 pounds What changed:  how much to take how to take this when to take this   traMADol 50 MG tablet Commonly known as: ULTRAM Take 1 tablet (50 mg total) by mouth every 6 (six) hours as needed for up to 6 doses for moderate pain or severe pain. What changed: reasons to take this   traZODone 50 MG  tablet Commonly known as: DESYREL Take 50 mg by mouth at bedtime as needed for sleep.        Follow-up Information     Lawerance Cruel, MD Follow up.   Specialty: Family Medicine Contact information: [place] [address] [phone]         Jettie Booze, MD .   Specialties: Cardiology, Radiology, Interventional Cardiology Contact information: [address] [place] [address] [phone]         Gaynelle Arabian, MD Follow up in 1 week(s).   Specialty: Orthopedic Surgery Contact information: [address] [place] [address] [phone]                Allergies  Allergen Reactions   Gabapentin     Other reaction(s): mood changes   Torsemide    Codeine Anxiety and Other (See Comments)    Insomnia/ hyper   Omeprazole Nausea And Vomiting and Other (See Comments)    GI upset, insomnia   Warfarin Sodium Anxiety and Other (See Comments)    Hx GI bleed    The results of significant diagnostics from this hospitalization (including imaging, microbiology, ancillary and laboratory) are listed below for reference.    Microbiology: Recent Results (from the past 240 hour(s))  Resp Panel by RT-PCR (Flu A&B, Covid) Nasopharyngeal Swab     Status: None   Collection Time: 01/23/21 10:00 PM   Specimen: Nasopharyngeal Swab; Nasopharyngeal(NP) swabs in vial transport medium  Result Value Ref Range Status   SARS Coronavirus 2 by RT PCR NEGATIVE NEGATIVE Final    Comment: (NOTE) SARS-CoV-2 target nucleic acids are NOT DETECTED.  The SARS-CoV-2 RNA is generally detectable in upper respiratory specimens during the acute phase of infection. The lowest concentration of SARS-CoV-2 viral copies this assay can detect is 138 copies/mL. A negative result does not preclude SARS-Cov-2 infection and should not be used as the sole basis for treatment or other patient management decisions. A negative result  may occur with  improper specimen collection/handling, submission of specimen other than nasopharyngeal swab, presence of viral mutation(s) within the areas targeted by this assay, and inadequate number of viral copies(<138 copies/mL). A negative result must be combined with clinical observations, patient history, and epidemiological information. The expected result is Negative.  Fact Sheet for Patients:  EntrepreneurPulse.com.au  Fact Sheet for Healthcare Providers:  IncredibleEmployment.be  This test is no t yet approved or cleared by the [place] FDA and  has been authorized for detection and/or diagnosis of SARS-CoV-2 by FDA under an Emergency Use Authorization (EUA). This EUA will remain  in effect (meaning this test can be used) for the duration of the COVID-19 declaration under Section 564(b)(1) of the Act, 21 U.S.C.section 360bbb-3(b)(1), unless the authorization is terminated  or revoked sooner.       Influenza A by PCR NEGATIVE NEGATIVE Final   Influenza B by PCR NEGATIVE NEGATIVE Final    Comment: (NOTE) The Xpert Xpress SARS-CoV-2/FLU/RSV plus assay is intended as an aid in the diagnosis of influenza from Nasopharyngeal swab specimens and should not be used as a sole basis for treatment. Nasal washings and aspirates are unacceptable for Xpert Xpress SARS-CoV-2/FLU/RSV testing.  Fact Sheet for Patients: EntrepreneurPulse.com.au  Fact Sheet for Healthcare Providers: IncredibleEmployment.be  This test is not yet approved or cleared by the [place] FDA and has been authorized for detection and/or diagnosis of SARS-CoV-2 by FDA under an Emergency Use Authorization (EUA). This EUA will remain in effect (meaning this test can be used) for the duration of the COVID-19 declaration under Section 564(b)(1) of the Act, 21 U.S.C. section 360bbb-3(b)(1), unless the authorization is terminated  or revoked.  Performed at [facility], [place] [address], [place], [place] [address]   Surgical PCR screen     Status: None   Collection Time: 01/24/21  3:44 PM   Specimen: Nasal Mucosa; Nasal Swab  Result Value Ref Range Status   MRSA, PCR NEGATIVE NEGATIVE Final   Staphylococcus aureus NEGATIVE NEGATIVE Final    Comment: (NOTE) The Xpert SA Assay (FDA approved for NASAL specimens in patients 48 years of age and older), is one component of a comprehensive surveillance program. It is not intended to diagnose infection nor to guide or monitor treatment. Performed at [facility], [place] [address], [place], [place] [address]   Resp Panel by RT-PCR (Flu A&B, Covid) Nasopharyngeal Swab     Status: None   Collection Time: 01/26/21 12:38 PM   Specimen: Nasopharyngeal Swab; Nasopharyngeal(NP) swabs in vial transport medium  Result Value Ref Range Status   SARS Coronavirus 2 by RT PCR NEGATIVE NEGATIVE Final    Comment: (NOTE) SARS-CoV-2 target nucleic acids are NOT DETECTED.  The SARS-CoV-2 RNA is generally detectable in upper respiratory specimens during the acute phase of infection. The lowest concentration of SARS-CoV-2 viral copies this assay can detect is 138 copies/mL. A negative result does not preclude SARS-Cov-2 infection and should not be used as the sole basis for treatment or other patient management decisions. A negative result may occur with  improper specimen collection/handling, submission of specimen other than nasopharyngeal swab, presence of viral mutation(s) within the areas targeted by this assay, and inadequate number of viral copies(<138 copies/mL). A negative result must be combined with clinical observations, patient history, and epidemiological information. The expected result is Negative.  Fact Sheet for Patients:  EntrepreneurPulse.com.au  Fact Sheet for Healthcare Providers:  IncredibleEmployment.be  This  test is no t yet approved or cleared by the [place] FDA and  has been authorized for detection and/or diagnosis of SARS-CoV-2 by FDA under an Emergency Use Authorization (EUA). This EUA will remain  in effect (meaning this test can be used) for the duration of the COVID-19 declaration under Section 564(b)(1) of the Act, 21 U.S.C.section 360bbb-3(b)(1),  unless the authorization is terminated  or revoked sooner.       Influenza A by PCR NEGATIVE NEGATIVE Final   Influenza B by PCR NEGATIVE NEGATIVE Final    Comment: (NOTE) The Xpert Xpress SARS-CoV-2/FLU/RSV plus assay is intended as an aid in the diagnosis of influenza from Nasopharyngeal swab specimens and should not be used as a sole basis for treatment. Nasal washings and aspirates are unacceptable for Xpert Xpress SARS-CoV-2/FLU/RSV testing.  Fact Sheet for Patients: EntrepreneurPulse.com.au  Fact Sheet for Healthcare Providers: IncredibleEmployment.be  This test is not yet approved or cleared by the [place] FDA and has been authorized for detection and/or diagnosis of SARS-CoV-2 by FDA under an Emergency Use Authorization (EUA). This EUA will remain in effect (meaning this test can be used) for the duration of the COVID-19 declaration under Section 564(b)(1) of the Act, 21 U.S.C. section 360bbb-3(b)(1), unless the authorization is terminated or revoked.  Performed at [facility], [place] [address], [place], [place] [address]     Procedures/Studies: CT ABDOMEN PELVIS WO CONTRAST  Result Date: 01/24/2021 CLINICAL DATA:  Fall EXAM: CT CHEST, ABDOMEN AND PELVIS WITHOUT CONTRAST TECHNIQUE: Multidetector CT imaging of the chest, abdomen and pelvis was performed following the standard protocol without IV contrast. COMPARISON:  None. FINDINGS: CT CHEST FINDINGS Cardiovascular: Calcific aortic atherosclerosis. Coronary artery atherosclerotic calcification. No pericardial effusion.  Mediastinum/Nodes: No mediastinal hematoma. No mediastinal, hilar or axillary lymphadenopathy. The visualized thyroid and thoracic esophageal course are unremarkable. Lungs/Pleura: No pulmonary contusion, pneumothorax or pleural effusion. The central airways are clear. Musculoskeletal: No acute fracture of the ribs, sternum or the visible portions of clavicles and scapulae. CT ABDOMEN PELVIS FINDINGS Hepatobiliary: No hepatic hematoma or laceration. No biliary dilatation. Status post cholecystectomy. Pancreas: Normal contours without ductal dilatation. No peripancreatic fluid collection. Spleen: No splenic laceration or hematoma. Adrenals/Urinary Tract: --Adrenal glands: No adrenal hemorrhage. --Right kidney/ureter: No hydronephrosis or perinephric hematoma. --Left kidney/ureter: No hydronephrosis or perinephric hematoma. --Urinary bladder: Unremarkable. Stomach/Bowel: --Stomach/Duodenum: No hiatal hernia or other gastric abnormality. Normal duodenal course and caliber. --Small bowel: No dilatation or inflammation. --Colon: No focal abnormality. --Appendix: Normal. Vascular/Lymphatic: Atherosclerotic calcification is present within the non-aneurysmal abdominal aorta, without hemodynamically significant stenosis. No abdominal or pelvic lymphadenopathy. Reproductive: Penile prosthesis. Musculoskeletal. No pelvic fractures.  Bilateral hip prostheses. Other: None. IMPRESSION: 1. No acute abnormality of the chest, abdomen or pelvis. 2. Coronary artery and aortic Atherosclerosis (ICD10-I70.0). Electronically Signed   By: Ulyses Jarred M.D.   On: 01/24/2021 03:09   CT HEAD WO CONTRAST  Result Date: 01/24/2021 CLINICAL DATA:  Trauma EXAM: CT HEAD WITHOUT CONTRAST CT CERVICAL SPINE WITHOUT CONTRAST TECHNIQUE: Multidetector CT imaging of the head and cervical spine was performed following the standard protocol without intravenous contrast. Multiplanar CT image reconstructions of the cervical spine were also generated.  COMPARISON:  CT brain 09/09/2018, 06/21/2015 FINDINGS: CT HEAD FINDINGS Brain: No acute territorial infarction or hemorrhage is visualized. Advanced atrophy. Slight asymmetric enlargement of left convexity CSF space, suspect that there may be chronic subdural effusion. Similar slight rightward shift. The ventricles are stable in size. Vascular: No hyperdense vessels. Vertebral and carotid vascular calcification. . Skull: Redemonstrated osseous destructive process at the right clavus and cavernous sinus. No fracture Sinuses/Orbits: Trace left mastoid opacification. Mild mucosal thickening in the sinuses Other: None CT CERVICAL SPINE FINDINGS Alignment: Straightening of the cervical spine. Trace retrolisthesis C4 on C5 and C5 on C6. Facet alignment is maintained Skull base and vertebrae: No acute fracture.  No primary bone lesion or focal pathologic process. Soft tissues and spinal canal: No prevertebral fluid or swelling. No visible canal hematoma. Disc levels: Advanced degenerative changes C3 through C7. Bony fusion across C5-C6. Facet degenerative changes at multiple levels with foraminal stenosis. Upper chest: Negative. Other: None IMPRESSION: 1. No definite CT evidence for acute intracranial abnormality. Suspected chronic left subdural effusion. Atrophy 2. Straightening of the cervical spine with degenerative changes. No acute osseous abnormality 3. Chronic lytic/destructive process at the right clivus and involving the right cavernous sinus Electronically Signed   By: Donavan Foil M.D.   On: 01/24/2021 01:31   CT Chest Wo Contrast  Result Date: 01/24/2021 CLINICAL DATA:  Fall EXAM: CT CHEST, ABDOMEN AND PELVIS WITHOUT CONTRAST TECHNIQUE: Multidetector CT imaging of the chest, abdomen and pelvis was performed following the standard protocol without IV contrast. COMPARISON:  None. FINDINGS: CT CHEST FINDINGS Cardiovascular: Calcific aortic atherosclerosis. Coronary artery atherosclerotic calcification. No  pericardial effusion. Mediastinum/Nodes: No mediastinal hematoma. No mediastinal, hilar or axillary lymphadenopathy. The visualized thyroid and thoracic esophageal course are unremarkable. Lungs/Pleura: No pulmonary contusion, pneumothorax or pleural effusion. The central airways are clear. Musculoskeletal: No acute fracture of the ribs, sternum or the visible portions of clavicles and scapulae. CT ABDOMEN PELVIS FINDINGS Hepatobiliary: No hepatic hematoma or laceration. No biliary dilatation. Status post cholecystectomy. Pancreas: Normal contours without ductal dilatation. No peripancreatic fluid collection. Spleen: No splenic laceration or hematoma. Adrenals/Urinary Tract: --Adrenal glands: No adrenal hemorrhage. --Right kidney/ureter: No hydronephrosis or perinephric hematoma. --Left kidney/ureter: No hydronephrosis or perinephric hematoma. --Urinary bladder: Unremarkable. Stomach/Bowel: --Stomach/Duodenum: No hiatal hernia or other gastric abnormality. Normal duodenal course and caliber. --Small bowel: No dilatation or inflammation. --Colon: No focal abnormality. --Appendix: Normal. Vascular/Lymphatic: Atherosclerotic calcification is present within the non-aneurysmal abdominal aorta, without hemodynamically significant stenosis. No abdominal or pelvic lymphadenopathy. Reproductive: Penile prosthesis. Musculoskeletal. No pelvic fractures.  Bilateral hip prostheses. Other: None. IMPRESSION: 1. No acute abnormality of the chest, abdomen or pelvis. 2. Coronary artery and aortic Atherosclerosis (ICD10-I70.0). Electronically Signed   By: Ulyses Jarred M.D.   On: 01/24/2021 03:09   CT CERVICAL SPINE WO CONTRAST  Result Date: 01/24/2021 CLINICAL DATA:  Trauma EXAM: CT HEAD WITHOUT CONTRAST CT CERVICAL SPINE WITHOUT CONTRAST TECHNIQUE: Multidetector CT imaging of the head and cervical spine was performed following the standard protocol without intravenous contrast. Multiplanar CT image reconstructions of the  cervical spine were also generated. COMPARISON:  CT brain 09/09/2018, 06/21/2015 FINDINGS: CT HEAD FINDINGS Brain: No acute territorial infarction or hemorrhage is visualized. Advanced atrophy. Slight asymmetric enlargement of left convexity CSF space, suspect that there may be chronic subdural effusion. Similar slight rightward shift. The ventricles are stable in size. Vascular: No hyperdense vessels. Vertebral and carotid vascular calcification. . Skull: Redemonstrated osseous destructive process at the right clavus and cavernous sinus. No fracture Sinuses/Orbits: Trace left mastoid opacification. Mild mucosal thickening in the sinuses Other: None CT CERVICAL SPINE FINDINGS Alignment: Straightening of the cervical spine. Trace retrolisthesis C4 on C5 and C5 on C6. Facet alignment is maintained Skull base and vertebrae: No acute fracture. No primary bone lesion or focal pathologic process. Soft tissues and spinal canal: No prevertebral fluid or swelling. No visible canal hematoma. Disc levels: Advanced degenerative changes C3 through C7. Bony fusion across C5-C6. Facet degenerative changes at multiple levels with foraminal stenosis. Upper chest: Negative. Other: None IMPRESSION: 1. No definite CT evidence for acute intracranial abnormality. Suspected chronic left subdural effusion. Atrophy 2. Straightening of the cervical spine with  degenerative changes. No acute osseous abnormality 3. Chronic lytic/destructive process at the right clivus and involving the right cavernous sinus Electronically Signed   By: Donavan Foil M.D.   On: 01/24/2021 01:31   DG Pelvis Portable  Result Date: 01/23/2021 CLINICAL DATA:  Fall. EXAM: PORTABLE PELVIS 1-2 VIEWS COMPARISON:  Pelvic radiograph dated 06/27/2009. FINDINGS: Bilateral total hip arthroplasties. The arthroplasty components appear intact and in anatomic alignment. No acute fracture or dislocation. The bones are osteopenic. Penile implant noted. There is advanced  atherosclerotic calcification of the aorta. The soft tissues are grossly unremarkable. IMPRESSION: 1. No acute fracture or dislocation. 2. Bilateral total hip arthroplasties. Electronically Signed   By: Anner Crete M.D.   On: 01/23/2021 22:25   CT Hip Left Wo Contrast  Result Date: 01/24/2021 CLINICAL DATA:  Left femoral fracture. EXAM: CT OF THE LEFT HIP WITHOUT CONTRAST TECHNIQUE: Multidetector CT imaging of the left hip was performed according to the standard protocol. Multiplanar CT image reconstructions were also generated. COMPARISON:  Left femoral series 01/23/2021. FINDINGS: Bones/Joint/Cartilage The bones are diffusely demineralized. Left hip total joint arthroplasty is again noted with an anterior proximal left femoral periprosthetic transverse fracture involving the anterior aspect of the greater trochanter extending medially and inferiorly to involve the anterior aspect of the proximal metaphysis of the bone, with the fracture fragment displaced anteriorly up to 1 cm. No displaced fracture is seen in the left pelvic ring, visualized right pelvic ring, left acetabulum and visualized lower left ilium. There are bridging osteophytes of the anterior left SI joint, slight spurring of the pubic symphysis. There is a 1.5 cm irregular sclerotic lesion in the left superior pubic ramus, which was also visible on a CT of the abdomen and pelvis of 11/02/2015. Ligaments Suboptimally assessed by CT. Muscles and Tendons No intramuscular hematoma is seen. Tendon anatomy is poorly demonstrated due to artifact from the left hip arthroplasty. Soft tissues Penile implant apparatus incidentally noted. The visualized bladder is unremarkable. No pelvic free air or fluid or soft tissue mass are observed. Prostate is surgically absent. IMPRESSION: Acute transverse periprosthetic fracture of the anterior aspect of the proximal left femur, with anterior distraction of the fracture fragment up to 1 cm. Additional findings  discussed above. Electronically Signed   By: Telford Nab M.D.   On: 01/24/2021 03:10   DG Chest Port 1 View  Result Date: 01/23/2021 CLINICAL DATA:  Fall. EXAM: PORTABLE CHEST 1 VIEW COMPARISON:  Chest x-ray 05/16/2019. FINDINGS: Left-sided ICD is again seen. The aorta is tortuous with atherosclerotic calcifications. The heart is enlarged. There are patchy airspace opacities in the left lung base. There is atelectasis in the right lung base. There is no pleural effusion or pneumothorax. There are few ill-defined small nodular densities in the right mid lung. No acute fractures are seen. IMPRESSION: 1. Left lower lobe airspace disease worrisome for infection. 2. Stable cardiomegaly. 3. There are few ill-defined nodular densities in the right mid lung, indeterminate. Findings may be infectious or inflammatory, but other etiologies are not excluded. Recommend short-term follow-up x-ray in 4-6 weeks to re-evaluate. Chest CT can be performed as clinically warranted. Electronically Signed   By: Ronney Asters M.D.   On: 01/23/2021 22:24   DG FEMUR PORT MIN 2 VIEWS LEFT  Result Date: 01/23/2021 CLINICAL DATA:  Fall. EXAM: LEFT FEMUR PORTABLE 2 VIEWS COMPARISON:  CT abdomen and pelvis 05/16/2019. FINDINGS: The bones are diffusely osteopenic. Left hip arthroplasty appears in anatomic alignment. On the frogleg lateral view  of the left hip there is an acute fracture of the proximal femur laterally fracture fragment distracted 1.4 mm. Left knee arthroplasty appears in anatomic alignment. There are extensive vascular calcifications in the soft tissues. Penile pump is present. IMPRESSION: 1. Left hip arthroplasty in anatomic alignment. There is acute fracture of the proximal left femur. 2. Left knee arthroplasty appears uncomplicated. Electronically Signed   By: Ronney Asters M.D.   On: 01/23/2021 22:28    Labs: BNP (last 3 results) No results for input(s): BNP in the last 8760 hours. Basic Metabolic  Panel: Recent Labs  Lab 01/23/21 2200 01/23/21 2225 01/24/21 0730 01/26/21 1038 01/27/21 0937  NA 137 136 136 135 131*  K 4.0 4.0 3.6 4.2 4.2  CL 102 101 104 102 99  CO2 25  --  24 26 26   GLUCOSE 153* 147* 123* 134* 132*  BUN 15 15 13  27* 31*  CREATININE 1.14 1.10 1.04 1.33* 1.20  CALCIUM 8.6*  --  8.4* 8.8* 8.5*   Liver Function Tests: Recent Labs  Lab 01/23/21 2200  AST 23  ALT 18  ALKPHOS 84  BILITOT 0.6  PROT 5.5*  ALBUMIN 3.3*   No results for input(s): LIPASE, AMYLASE in the last 168 hours. No results for input(s): AMMONIA in the last 168 hours. CBC: Recent Labs  Lab 01/23/21 2200 01/23/21 2225 01/24/21 0730 01/26/21 1038  WBC 6.6  --  7.7  --   HGB 13.0 13.3 12.3* 12.7*  HCT 40.0 39.0 36.4* 37.5*  MCV 94.8  --  93.6  --   PLT 125*  --  111*  --    Cardiac Enzymes: No results for input(s): CKTOTAL, CKMB, CKMBINDEX, TROPONINI in the last 168 hours. BNP: Invalid input(s): POCBNP CBG: No results for input(s): GLUCAP in the last 168 hours. D-Dimer No results for input(s): DDIMER in the last 72 hours. Hgb A1c No results for input(s): HGBA1C in the last 72 hours. Lipid Profile No results for input(s): CHOL, HDL, LDLCALC, TRIG, CHOLHDL, LDLDIRECT in the last 72 hours. Thyroid function studies No results for input(s): TSH, T4TOTAL, T3FREE, THYROIDAB in the last 72 hours.  Invalid input(s): FREET3 Anemia work up No results for input(s): VITAMINB12, FOLATE, FERRITIN, TIBC, IRON, RETICCTPCT in the last 72 hours. Urinalysis    Component Value Date/Time   COLORURINE STRAW (A) 01/23/2021 2200   APPEARANCEUR CLEAR 01/23/2021 2200   LABSPEC 1.005 01/23/2021 2200   PHURINE 5.0 01/23/2021 2200   GLUCOSEU NEGATIVE 01/23/2021 2200   HGBUR NEGATIVE 01/23/2021 2200   BILIRUBINUR NEGATIVE 01/23/2021 2200   KETONESUR NEGATIVE 01/23/2021 2200   PROTEINUR NEGATIVE 01/23/2021 2200   UROBILINOGEN 0.2 01/01/2015 1207   NITRITE NEGATIVE 01/23/2021 2200    LEUKOCYTESUR NEGATIVE 01/23/2021 2200   Sepsis Labs Invalid input(s): PROCALCITONIN,  WBC,  LACTICIDVEN Microbiology Recent Results (from the past 240 hour(s))  Resp Panel by RT-PCR (Flu A&B, Covid) Nasopharyngeal Swab     Status: None   Collection Time: 01/23/21 10:00 PM   Specimen: Nasopharyngeal Swab; Nasopharyngeal(NP) swabs in vial transport medium  Result Value Ref Range Status   SARS Coronavirus 2 by RT PCR NEGATIVE NEGATIVE Final    Comment: (NOTE) SARS-CoV-2 target nucleic acids are NOT DETECTED.  The SARS-CoV-2 RNA is generally detectable in upper respiratory specimens during the acute phase of infection. The lowest concentration of SARS-CoV-2 viral copies this assay can detect is 138 copies/mL. A negative result does not preclude SARS-Cov-2 infection and should not be used as the sole basis  for treatment or other patient management decisions. A negative result may occur with  improper specimen collection/handling, submission of specimen other than nasopharyngeal swab, presence of viral mutation(s) within the areas targeted by this assay, and inadequate number of viral copies(<138 copies/mL). A negative result must be combined with clinical observations, patient history, and epidemiological information. The expected result is Negative.  Fact Sheet for Patients:  EntrepreneurPulse.com.au  Fact Sheet for Healthcare Providers:  IncredibleEmployment.be  This test is no t yet approved or cleared by the [place] FDA and  has been authorized for detection and/or diagnosis of SARS-CoV-2 by FDA under an Emergency Use Authorization (EUA). This EUA will remain  in effect (meaning this test can be used) for the duration of the COVID-19 declaration under Section 564(b)(1) of the Act, 21 U.S.C.section 360bbb-3(b)(1), unless the authorization is terminated  or revoked sooner.       Influenza A by PCR NEGATIVE NEGATIVE Final   Influenza B  by PCR NEGATIVE NEGATIVE Final    Comment: (NOTE) The Xpert Xpress SARS-CoV-2/FLU/RSV plus assay is intended as an aid in the diagnosis of influenza from Nasopharyngeal swab specimens and should not be used as a sole basis for treatment. Nasal washings and aspirates are unacceptable for Xpert Xpress SARS-CoV-2/FLU/RSV testing.  Fact Sheet for Patients: EntrepreneurPulse.com.au  Fact Sheet for Healthcare Providers: IncredibleEmployment.be  This test is not yet approved or cleared by the [place] FDA and has been authorized for detection and/or diagnosis of SARS-CoV-2 by FDA under an Emergency Use Authorization (EUA). This EUA will remain in effect (meaning this test can be used) for the duration of the COVID-19 declaration under Section 564(b)(1) of the Act, 21 U.S.C. section 360bbb-3(b)(1), unless the authorization is terminated or revoked.  Performed at [facility], [place] [address] [place], [place] [address]   Surgical PCR screen     Status: None   Collection Time: 01/24/21  3:44 PM   Specimen: Nasal Mucosa; Nasal Swab  Result Value Ref Range Status   MRSA, PCR NEGATIVE NEGATIVE Final   Staphylococcus aureus NEGATIVE NEGATIVE Final    Comment: (NOTE) The Xpert SA Assay (FDA approved for NASAL specimens in patients 23 years of age and older), is one component of a comprehensive surveillance program. It is not intended to diagnose infection nor to guide or monitor treatment. Performed at [facility], [place] [address], [place], [place] [address]   Resp Panel by RT-PCR (Flu A&B, Covid) Nasopharyngeal Swab     Status: None   Collection Time: 01/26/21 12:38 PM   Specimen: Nasopharyngeal Swab; Nasopharyngeal(NP) swabs in vial transport medium  Result Value Ref Range Status   SARS Coronavirus 2 by RT PCR NEGATIVE NEGATIVE Final    Comment: (NOTE) SARS-CoV-2 target nucleic acids are NOT DETECTED.  The SARS-CoV-2 RNA is  generally detectable in upper respiratory specimens during the acute phase of infection. The lowest concentration of SARS-CoV-2 viral copies this assay can detect is 138 copies/mL. A negative result does not preclude SARS-Cov-2 infection and should not be used as the sole basis for treatment or other patient management decisions. A negative result may occur with  improper specimen collection/handling, submission of specimen other than nasopharyngeal swab, presence of viral mutation(s) within the areas targeted by this assay, and inadequate number of viral copies(<138 copies/mL). A negative result must be combined with clinical observations, patient history, and epidemiological information. The expected result is Negative.  Fact Sheet for Patients:  EntrepreneurPulse.com.au  Fact Sheet for  Healthcare Providers:  IncredibleEmployment.be  This test is no t yet approved or cleared by the [place] and  has been authorized for detection and/or diagnosis of SARS-CoV-2 by FDA under an Emergency Use Authorization (EUA). This EUA will remain  in effect (meaning this test can be used) for the duration of the COVID-19 declaration under Section 564(b)(1) of the Act, 21 U.S.C.section 360bbb-3(b)(1), unless the authorization is terminated  or revoked sooner.       Influenza A by PCR NEGATIVE NEGATIVE Final   Influenza B by PCR NEGATIVE NEGATIVE Final    Comment: (NOTE) The Xpert Xpress SARS-CoV-2/FLU/RSV plus assay is intended as an aid in the diagnosis of influenza from Nasopharyngeal swab specimens and should not be used as a sole basis for treatment. Nasal washings and aspirates are unacceptable for Xpert Xpress SARS-CoV-2/FLU/RSV testing.  Fact Sheet for Patients: EntrepreneurPulse.com.au  Fact Sheet for Healthcare Providers: IncredibleEmployment.be  This test is not yet approved or cleared by the [place] FDA and has been authorized for detection and/or diagnosis of SARS-CoV-2 by FDA under an Emergency Use Authorization (EUA). This EUA will remain in effect (meaning this test can be used) for the duration of the COVID-19 declaration under Section 564(b)(1) of the Act, 21 U.S.C. section 360bbb-3(b)(1), unless the authorization is terminated or revoked.  Performed at [facility], [place] [address] [place], [place] [address]      Time coordinating discharge: 35 minutes  SIGNED: Antonieta Pert, MD  [facility] 01/27/2021, 10:38 AM  If 7PM-7AM, please contact night-coverage [email or web address]

## 2021-01-27 NOTE — Care Management Important Message (Signed)
Important Message  Patient Details  Name: [name] MRN: [id number] Date of Birth: 08-01-1926   Medicare Important Message Given:  Yes     Hannah Beat 01/27/2021, 1:18 PM

## 2021-01-27 NOTE — TOC Transition Note (Signed)
Transition of Care [facility]) - CM/SW Discharge Note   Patient Details  Name: [name] MRN: [id number] Date of Birth: Dec 17, 1926  [facility] of [facility]) CM/SW Contact:  Joanne Chars, LCSW Phone Number: 01/27/2021, 10:50 AM   Clinical Narrative:   Pt discharging to [facility].  RN call report to [phone].  PTAR called at [phone].     Final next level of care: Skilled Nursing Facility Barriers to Discharge: Barriers Resolved   Patient Goals and CMS Choice   CMS Medicare.gov Compare Post Acute Care list provided to:: Patient Represenative (must comment) Choice offered to / list presented to : Adult Children  Discharge Placement              Patient chooses bed at:  [facility]) Patient to be transferred to facility by: [facility] Name of family member notified: [name], daughter in law Patient and family notified of of transfer: 01/27/21  Discharge Plan and Services In-house Referral: Clinical Social Work   Post Acute Care Choice: [place]                               Social Determinants of Health ([place]) Interventions     Readmission Risk Interventions No flowsheet data found.

## 2021-01-27 NOTE — Plan of Care (Signed)

## 2021-01-27 NOTE — Progress Notes (Signed)
Report called to nurse Sadie Haber at [facility]. Questions and concerns answered.

## 2021-01-30 ENCOUNTER — Non-Acute Institutional Stay (SKILLED_NURSING_FACILITY): Payer: Medicare HMO | Admitting: Adult Health

## 2021-01-30 ENCOUNTER — Encounter: Payer: Self-pay | Admitting: Adult Health

## 2021-01-30 DIAGNOSIS — N1831 Chronic kidney disease, stage 3a: Secondary | ICD-10-CM

## 2021-01-30 DIAGNOSIS — I2510 Atherosclerotic heart disease of native coronary artery without angina pectoris: Secondary | ICD-10-CM | POA: Diagnosis not present

## 2021-01-30 DIAGNOSIS — I5042 Chronic combined systolic (congestive) and diastolic (congestive) heart failure: Secondary | ICD-10-CM

## 2021-01-30 DIAGNOSIS — M9702XS Periprosthetic fracture around internal prosthetic left hip joint, sequela: Secondary | ICD-10-CM

## 2021-01-30 DIAGNOSIS — I4821 Permanent atrial fibrillation: Secondary | ICD-10-CM | POA: Diagnosis not present

## 2021-01-30 DIAGNOSIS — L89611 Pressure ulcer of right heel, stage 1: Secondary | ICD-10-CM

## 2021-01-30 NOTE — Progress Notes (Signed)
Location:  [place] Room Number: 062 A Place of Service:  SNF (31) Provider:  Durenda Age, DNP, FNP-BC  Patient Care Team: Lawerance Cruel, MD as PCP - General (Family Medicine) Jettie Booze, MD as PCP - Cardiology (Cardiology)  Extended Emergency Contact Information Primary Emergency Contact: Maragh,Michael Address: [address]          [place], [place] [address] Johnnette Litter of [place] Phone: [phone] Mobile Phone: [phone] Relation: Son Secondary Emergency Contact: Braylin, Xu Mobile Phone: [phone] Relation: Other  Code Status:  FULL CODE  Goals of care: Advanced Directive information Advanced Directives 01/30/2021  Does Patient Have a Medical Advance Directive? No  Type of Advance Directive -  Does patient want to make changes to medical advance directive? -  Copy of [place] in Chart? -  Would patient like information on creating a medical advance directive? No - Patient declined  Pre-existing out of facility DNR order (yellow form or pink MOST form) -     Chief Complaint  Patient presents with   Hospitalization Follow-up    Hospitalization Follow up    HPI:  Pt is a 87 y.o. male who was admitted to [facility] on 01/27/21 post hospital admission 01/23/2021 to 01/27/2021.  He has a PMH of atrial fibrillation not on anticoagulation, CHF with EF 30 to 35%, history of heart block status post BiV pacer, CAD last stent 2015, chronic kidney disease stage IIIa with baseline creatinine 1.1-1.2, and prostate cancer.  He had a mechanical fall at home and was brought to the ED. imaging done in the ED showed periprostatic hip fracture.  Orthopedic was consulted and recommended nonoperative management, WBAT.  He was given gentle IV fluid for hydration for his dizziness.               He was seen in the room today with caregiver at bedside.  Caregiver stated that he is not  allergic to torsemide but allergic to tramadol which causes him to have confusion.  Caregiver requested for patient to be given oxycodone PRN for pain and stated that he used to take torsemide daily at home for CHF.  Right he\el noted to have blister which according to charge nurse was present on day of admission to facility.   Past Medical History:  Diagnosis Date   Anxiety    Arthritis    Atrial fibrillation, permanent ([place]) 01/07/2016   Blood transfusion    "related to a surgery"   BPH (benign prostatic hypertrophy) with urinary obstruction    s/p turp yrs ago   Bradycardia    a. Amio d/c'd 08/2013; brady arrest 08/2013 after PCI >>> recurrent AF >>> Amiodarone restarted. b. Pacemaker being considered in 11/2014.   CAD (coronary artery disease)    a. s/p MI and prior PCI of LAD;  b. LHC (08/2013):  prox LAD 60-70%, mid LAD stents ok, ostial lesion at Dx jailed by stent, mild CFX and RCA disesase >>>  PCI (09/08/13):  rotational atherectomy + Promus DES to prox LAD   Cardiomyopathy [facility])    a. Echo (08/2013):  EF 30-35%, AS hypokinesis, Gr 1 diast dysfn, mild MR, mild LAE >>> b. improved EF 50-55% by echo 8/15. c. EF down again by echo 12/2014 to 30-35% but 51% by nuc.   Chronic lower back pain    Chronic systolic CHF (congestive heart failure) (HCC)    CKD (chronic kidney disease), stage III ([place])  a. Per review of labs baseline Cr 1.1-1.3.   DDD (degenerative disc disease)    chronic back pain   Depression    Diverticulitis of colon with bleeding    s/p sigmoid resection '88   DJD (degenerative joint disease) hips and knees   s/p bilateral total replacements   GERD (gastroesophageal reflux disease)    occ. take prevacid   Glaucoma    Hearing impaired person, right    History of GI diverticular bleed april 2012   transfused blood and resolved without surgical intervention   Hypertension    Impaired hearing bilateral    only left hearing aid   Impotence, organic    s/p penile  prosthesis 1990's   Incomplete bladder emptying    LBBB (left bundle branch block)    Meningioma (HCC) right -sided w/ right VI palsy   followed by dr Gaynell Face   Mitral regurgitation    a. Mild-mod by echo 12/2014.   Myocardial infarction ([place]) 1980's- medical intervention   "so mild I didn't know I'd had it"   PAF (paroxysmal atrial fibrillation) ([place])    not on coumadin due to hx of GI bleed.   Prostate cancer ([place]) 11/30/13   Gleason 8, volume 22.14 cc   Sleep apnea    non-compliant cpap   Uses hearing aid    left    Vitreous hemorrhage (HCC)    related to branch retinal vein occlusion left eye   Wears glasses    Past Surgical History:  Procedure Laterality Date   APPENDECTOMY     CARDIAC CATHETERIZATION  2007   noncritical cad (results w/ chart)   CARDIOVERSION N/A 10/13/2015   Procedure: CARDIOVERSION;  Surgeon: Josue Hector, MD;  Location: [facility] ENDOSCOPY;  Service: Cardiovascular;  Laterality: N/A;   CATARACT EXTRACTION W/ INTRAOCULAR LENS  IMPLANT, BILATERAL Bilateral ~ [place] Right "several"   CORONARY ANGIOPLASTY WITH STENT PLACEMENT  08-03-08   drug-eluting stent x2 distal and mid lad   EP IMPLANTABLE DEVICE N/A 01/31/2015   Procedure: BiV Pacemaker Insertion CRT-P;  Surgeon: Evans Lance, MD;  Location: [place] CV LAB;  Service: Cardiovascular;  Laterality: N/A;   EP IMPLANTABLE DEVICE N/A 02/07/2015   Procedure: Lead Revision/Repair;  Surgeon: Deboraha Sprang, MD;  Location: [place] CV LAB;  Service: Cardiovascular;  Laterality: N/A;   ESOPHAGOGASTRODUODENOSCOPY N/A 02/11/2014   Procedure: ESOPHAGOGASTRODUODENOSCOPY (EGD);  Surgeon: Cleotis Nipper, MD;  Location: [facility] ENDOSCOPY;  Service: Endoscopy;  Laterality: N/A;   gamma knife radiation  2000   [facility] for meningioma, last eval 2013- no change   GAS INSERTION Left 07/14/2019   Procedure: INSERTION OF GAS;  Surgeon: Hayden Pedro, MD;  Location: [place];   Service: Ophthalmology;  Laterality: Left;   INGUINAL HERNIA REPAIR Bilateral    INNER EAR SURGERY Right yrs ago   "trying to get my hearing back   LEFT HEART CATHETERIZATION WITH CORONARY ANGIOGRAM N/A 09/04/2013   Procedure: LEFT HEART CATHETERIZATION WITH CORONARY ANGIOGRAM;  Surgeon: Jettie Booze, MD;  Location: [facility] CATH LAB;  Service: Cardiovascular;  Laterality: N/A;   PARS PLANA VITRECTOMY Left 07/14/2019   27 GAUGE   PARS PLANA VITRECTOMY 27 GAUGE Left 07/14/2019   Procedure: PARS PLANA VITRECTOMY 27 GAUGE;  Surgeon: Hayden Pedro, MD;  Location: [place];  Service: Ophthalmology;  Laterality: Left;   PENILE PROSTHESIS IMPLANT  1990's   PERCUTANEOUS CORONARY ROTOBLATOR INTERVENTION (  PCI-R) N/A 09/08/2013   Procedure: PERCUTANEOUS CORONARY ROTOBLATOR INTERVENTION (PCI-R);  Surgeon: Jettie Booze, MD;  Location: [facility] CATH LAB;  Service: Cardiovascular;  Laterality: N/A;   PHOTOCOAGULATION WITH LASER Left 07/14/2019   Procedure: PHOTOCOAGULATION WITH LASER;  Surgeon: Hayden Pedro, MD;  Location: [place];  Service: Ophthalmology;  Laterality: Left;   PROSTATE BIOPSY  11/30/13   Gleason 8, vol 22.14 cc   REVISION TOTAL KNEE ARTHROPLASTY Right    SHOULDER OPEN ROTATOR CUFF REPAIR Left    TOTAL HIP ARTHROPLASTY Right 03-25-08--   TOTAL HIP ARTHROPLASTY Left 2005   TOTAL HIP REVISION Right 3-4 times   TOTAL KNEE ARTHROPLASTY Right 2004   TOTAL KNEE ARTHROPLASTY Left 1997   TRANSURETHRAL RESECTION OF PROSTATE  "years ago"   TRANSURETHRAL RESECTION OF PROSTATE  01/08/2011   Procedure: TRANSURETHRAL RESECTION OF THE PROSTATE (TURP);  Surgeon: Franchot Gallo;  Location: [place];  Service: Urology;  Laterality: N/A;  GYRUS     Allergies  Allergen Reactions   Gabapentin     Other reaction(s): mood changes   Torsemide    Codeine Anxiety and Other (See Comments)    Insomnia/ hyper   Omeprazole Nausea And Vomiting and Other (See Comments)    GI upset,  insomnia   Warfarin Sodium Anxiety and Other (See Comments)    Hx GI bleed    Outpatient Encounter Medications as of 01/30/2021  Medication Sig   acetaminophen (TYLENOL) 500 MG tablet Take 2 tablets (1,000 mg total) by mouth every 8 (eight) hours as needed.   bisacodyl (DULCOLAX) 10 MG suppository Place 10 mg rectally as needed for moderate constipation. If not relieved by MOM   brimonidine (ALPHAGAN P) 0.1 % SOLN Place 1 drop into both eyes 2 (two) times daily.    clopidogrel (PLAVIX) 75 MG tablet TAKE 1 TABLET EVERY DAY (Patient taking differently: Take 75 mg by mouth daily.)   cyclobenzaprine (FLEXERIL) 5 MG tablet Take 2.5 mg by mouth 3 (three) times daily as needed for muscle spasms.    diazepam (VALIUM) 5 MG tablet Take 1 tablet (5 mg total) by mouth every 12 (twelve) hours as needed for up to 2 doses for anxiety.   dorzolamide-timolol (COSOPT) 22.3-6.8 MG/ML ophthalmic solution Place 1 drop into both eyes 2 (two) times daily.    esomeprazole (NEXIUM) 40 MG capsule Take 40 mg by mouth 2 (two) times daily.   magnesium hydroxide (MILK OF MAGNESIA) 400 MG/5ML suspension Take 30 mLs by mouth daily as needed for mild constipation. If no BM in 3 days   METAMUCIL FIBER PO Take 1 Scoop by mouth in the morning and at bedtime.   nitroGLYCERIN (NITROSTAT) 0.4 MG SL tablet Place 1 tablet (0.4 mg total) under the tongue every 5 (five) minutes as needed for chest pain.   pantoprazole (PROTONIX) 40 MG tablet Take 1 tablet by mouth daily.   polyethylene glycol (MIRALAX / GLYCOLAX) 17 g packet Take 17 g by mouth daily as needed.   Probiotic Product (PROBIOTIC PO) Take 1 tablet by mouth daily. ALIGN   Sodium Phosphates (RA SALINE ENEMA RE) Place 1 Bottle rectally daily as needed. If not relieved by Biscodyl suppository or Milk of Magnesia, Contact Dr. If no relief is given after Enema   tamsulosin (FLOMAX) 0.4 MG CAPS capsule Take 0.4 mg by mouth daily.   traMADol (ULTRAM) 50 MG tablet Take 1 tablet (50 mg  total) by mouth every 6 (six) hours as needed for up to  6 doses for moderate pain or severe pain.   traZODone (DESYREL) 50 MG tablet Take 50 mg by mouth at bedtime as needed for sleep.    [DISCONTINUED] lactose free nutrition (BOOST) LIQD Take 237 mLs by mouth daily. vanilla   [DISCONTINUED] OVER THE COUNTER MEDICATION Take 50 mg by mouth in the morning and at bedtime. Hemp capsule   [DISCONTINUED] OVER THE COUNTER MEDICATION Apply 1 application topically at bedtime. Arthritis pain relief to left elbow   [DISCONTINUED] potassium chloride (K-DUR,KLOR-CON) 10 MEQ tablet TAKE 1 TABLET EVERY DAY AS NEEDED. WHEN YOU TAKE YOUR FUROSEMIDE. (Patient taking differently: Take 10 mEq by mouth daily.)   [DISCONTINUED] torsemide (DEMADEX) 20 MG tablet Take one extra tablet of torsemide by mouth if weight gain of 3 pounds (Patient taking differently: Take 20 mg by mouth daily. Take one extra tablet of torsemide by mouth if weight gain of 3 pounds)   No facility-administered encounter medications on file as of 01/30/2021.    Review of Systems  GENERAL: No change in appetite, no fatigue, no weight changes, no fever or chills   MOUTH and THROAT: Denies oral discomfort, gingival pain or bleeding, pain from teeth or hoarseness   RESPIRATORY: no cough, SOB, DOE, wheezing, hemoptysis CARDIAC: No chest pain, edema or palpitations GI: No abdominal pain, diarrhea, constipation, heart burn, nausea or vomiting GU: Denies dysuria, frequency, hematuria or discharge NEUROLOGICAL: Denies dizziness, syncope, numbness, or headache PSYCHIATRIC: Denies feelings of depression or anxiety. No report of hallucinations, insomnia, paranoia, or agitation    Immunization History  Administered Date(s) Administered   Influenza, High Dose Seasonal PF 01/21/2012, 12/04/2013, 11/15/2015, 01/10/2018, 12/09/2018, 01/18/2020   Influenza,inj,Quad PF,6+ Mos 12/14/2014   Influenza-Unspecified 12/27/2013, 01/12/2015, 10/27/2020    Pneumococcal Conjugate PCV 7 10/27/2020   Tdap 10/28/2019, 09/26/2020   Unspecified SARS-COV-2 Vaccination 02/27/2019, 03/30/2019, 09/27/2019   Zoster, Live 09/27/2019   Pertinent  Health Maintenance Due  Topic Date Due   INFLUENZA VACCINE  Completed   Fall Risk 01/25/2021 01/25/2021 01/26/2021 01/26/2021 01/27/2021  Falls in the past year? - - - - -  Was there an injury with Fall? - - - - -  Patient Fall Risk Level High fall risk High fall risk High fall risk High fall risk High fall risk  Patient at Risk for Falls Due to - - - - -  Patient at Risk for Falls Due to - - - - -  Fall risk Follow up - - - - -     Vitals:   01/30/21 1038  BP: 124/77  Pulse: 73  Resp: 18  Temp: (!) 97.5 F (36.4 C)  Weight: 192 lb 12.8 oz (87.5 kg)  Height: 5\' 8"  (1.727 m)   Body mass index is 29.32 kg/m.  Physical Exam  GENERAL APPEARANCE: Well nourished. In no acute distress. Obese. SKIN:  Right heel with blister. Left hip with bruising. MOUTH and THROAT: Lips are without lesions. Oral mucosa is moist and without lesions.  RESPIRATORY: Breathing is even & unlabored, BS CTAB CARDIAC: RRR, no murmur,no extra heart sounds, no edema.  Left chest pacemaker. GI: Abdomen soft, normal BS, no masses, no tenderness NEUROLOGICAL: There is no tremor. Speech is clear. Alert and oriented X 3. PSYCHIATRIC:  Affect and behavior are appropriate  Labs reviewed: Recent Labs    01/24/21 0730 01/26/21 1038 01/27/21 0937  NA 136 135 131*  K 3.6 4.2 4.2  CL 104 102 99  CO2 24 26 26   GLUCOSE 123* 134* 132*  BUN 13 27* 31*  CREATININE 1.04 1.33* 1.20  CALCIUM 8.4* 8.8* 8.5*   Recent Labs    01/23/21 2200  AST 23  ALT 18  ALKPHOS 84  BILITOT 0.6  PROT 5.5*  ALBUMIN 3.3*   Recent Labs    01/23/21 2200 01/23/21 2225 01/24/21 0730 01/26/21 1038  WBC 6.6  --  7.7  --   HGB 13.0 13.3 12.3* 12.7*  HCT 40.0 39.0 36.4* 37.5*  MCV 94.8  --  93.6  --   PLT 125*  --  111*  --    Lab Results   Component Value Date   TSH 2.170 02/06/2019   Lab Results  Component Value Date   HGBA1C 5.4 02/10/2014   Lab Results  Component Value Date   CHOL 168 05/18/2019   HDL 36 (L) 05/18/2019   LDLCALC 116 (H) 05/18/2019   TRIG 79 05/18/2019   CHOLHDL 4.7 05/18/2019    Significant Diagnostic Results in last 30 days:  CT ABDOMEN PELVIS WO CONTRAST  Result Date: 01/24/2021 CLINICAL DATA:  Fall EXAM: CT CHEST, ABDOMEN AND PELVIS WITHOUT CONTRAST TECHNIQUE: Multidetector CT imaging of the chest, abdomen and pelvis was performed following the standard protocol without IV contrast. COMPARISON:  None. FINDINGS: CT CHEST FINDINGS Cardiovascular: Calcific aortic atherosclerosis. Coronary artery atherosclerotic calcification. No pericardial effusion. Mediastinum/Nodes: No mediastinal hematoma. No mediastinal, hilar or axillary lymphadenopathy. The visualized thyroid and thoracic esophageal course are unremarkable. Lungs/Pleura: No pulmonary contusion, pneumothorax or pleural effusion. The central airways are clear. Musculoskeletal: No acute fracture of the ribs, sternum or the visible portions of clavicles and scapulae. CT ABDOMEN PELVIS FINDINGS Hepatobiliary: No hepatic hematoma or laceration. No biliary dilatation. Status post cholecystectomy. Pancreas: Normal contours without ductal dilatation. No peripancreatic fluid collection. Spleen: No splenic laceration or hematoma. Adrenals/Urinary Tract: --Adrenal glands: No adrenal hemorrhage. --Right kidney/ureter: No hydronephrosis or perinephric hematoma. --Left kidney/ureter: No hydronephrosis or perinephric hematoma. --Urinary bladder: Unremarkable. Stomach/Bowel: --Stomach/Duodenum: No hiatal hernia or other gastric abnormality. Normal duodenal course and caliber. --Small bowel: No dilatation or inflammation. --Colon: No focal abnormality. --Appendix: Normal. Vascular/Lymphatic: Atherosclerotic calcification is present within the non-aneurysmal abdominal  aorta, without hemodynamically significant stenosis. No abdominal or pelvic lymphadenopathy. Reproductive: Penile prosthesis. Musculoskeletal. No pelvic fractures.  Bilateral hip prostheses. Other: None. IMPRESSION: 1. No acute abnormality of the chest, abdomen or pelvis. 2. Coronary artery and aortic Atherosclerosis (ICD10-I70.0). Electronically Signed   By: Ulyses Jarred M.D.   On: 01/24/2021 03:09   CT HEAD WO CONTRAST  Result Date: 01/24/2021 CLINICAL DATA:  Trauma EXAM: CT HEAD WITHOUT CONTRAST CT CERVICAL SPINE WITHOUT CONTRAST TECHNIQUE: Multidetector CT imaging of the head and cervical spine was performed following the standard protocol without intravenous contrast. Multiplanar CT image reconstructions of the cervical spine were also generated. COMPARISON:  CT brain 09/09/2018, 06/21/2015 FINDINGS: CT HEAD FINDINGS Brain: No acute territorial infarction or hemorrhage is visualized. Advanced atrophy. Slight asymmetric enlargement of left convexity CSF space, suspect that there may be chronic subdural effusion. Similar slight rightward shift. The ventricles are stable in size. Vascular: No hyperdense vessels. Vertebral and carotid vascular calcification. . Skull: Redemonstrated osseous destructive process at the right clavus and cavernous sinus. No fracture Sinuses/Orbits: Trace left mastoid opacification. Mild mucosal thickening in the sinuses Other: None CT CERVICAL SPINE FINDINGS Alignment: Straightening of the cervical spine. Trace retrolisthesis C4 on C5 and C5 on C6. Facet alignment is maintained Skull base and vertebrae: No acute fracture. No primary bone lesion or focal pathologic process.  Soft tissues and spinal canal: No prevertebral fluid or swelling. No visible canal hematoma. Disc levels: Advanced degenerative changes C3 through C7. Bony fusion across C5-C6. Facet degenerative changes at multiple levels with foraminal stenosis. Upper chest: Negative. Other: None IMPRESSION: 1. No definite CT  evidence for acute intracranial abnormality. Suspected chronic left subdural effusion. Atrophy 2. Straightening of the cervical spine with degenerative changes. No acute osseous abnormality 3. Chronic lytic/destructive process at the right clivus and involving the right cavernous sinus Electronically Signed   By: Donavan Foil M.D.   On: 01/24/2021 01:31   CT Chest Wo Contrast  Result Date: 01/24/2021 CLINICAL DATA:  Fall EXAM: CT CHEST, ABDOMEN AND PELVIS WITHOUT CONTRAST TECHNIQUE: Multidetector CT imaging of the chest, abdomen and pelvis was performed following the standard protocol without IV contrast. COMPARISON:  None. FINDINGS: CT CHEST FINDINGS Cardiovascular: Calcific aortic atherosclerosis. Coronary artery atherosclerotic calcification. No pericardial effusion. Mediastinum/Nodes: No mediastinal hematoma. No mediastinal, hilar or axillary lymphadenopathy. The visualized thyroid and thoracic esophageal course are unremarkable. Lungs/Pleura: No pulmonary contusion, pneumothorax or pleural effusion. The central airways are clear. Musculoskeletal: No acute fracture of the ribs, sternum or the visible portions of clavicles and scapulae. CT ABDOMEN PELVIS FINDINGS Hepatobiliary: No hepatic hematoma or laceration. No biliary dilatation. Status post cholecystectomy. Pancreas: Normal contours without ductal dilatation. No peripancreatic fluid collection. Spleen: No splenic laceration or hematoma. Adrenals/Urinary Tract: --Adrenal glands: No adrenal hemorrhage. --Right kidney/ureter: No hydronephrosis or perinephric hematoma. --Left kidney/ureter: No hydronephrosis or perinephric hematoma. --Urinary bladder: Unremarkable. Stomach/Bowel: --Stomach/Duodenum: No hiatal hernia or other gastric abnormality. Normal duodenal course and caliber. --Small bowel: No dilatation or inflammation. --Colon: No focal abnormality. --Appendix: Normal. Vascular/Lymphatic: Atherosclerotic calcification is present within the  non-aneurysmal abdominal aorta, without hemodynamically significant stenosis. No abdominal or pelvic lymphadenopathy. Reproductive: Penile prosthesis. Musculoskeletal. No pelvic fractures.  Bilateral hip prostheses. Other: None. IMPRESSION: 1. No acute abnormality of the chest, abdomen or pelvis. 2. Coronary artery and aortic Atherosclerosis (ICD10-I70.0). Electronically Signed   By: Ulyses Jarred M.D.   On: 01/24/2021 03:09   CT CERVICAL SPINE WO CONTRAST  Result Date: 01/24/2021 CLINICAL DATA:  Trauma EXAM: CT HEAD WITHOUT CONTRAST CT CERVICAL SPINE WITHOUT CONTRAST TECHNIQUE: Multidetector CT imaging of the head and cervical spine was performed following the standard protocol without intravenous contrast. Multiplanar CT image reconstructions of the cervical spine were also generated. COMPARISON:  CT brain 09/09/2018, 06/21/2015 FINDINGS: CT HEAD FINDINGS Brain: No acute territorial infarction or hemorrhage is visualized. Advanced atrophy. Slight asymmetric enlargement of left convexity CSF space, suspect that there may be chronic subdural effusion. Similar slight rightward shift. The ventricles are stable in size. Vascular: No hyperdense vessels. Vertebral and carotid vascular calcification. . Skull: Redemonstrated osseous destructive process at the right clavus and cavernous sinus. No fracture Sinuses/Orbits: Trace left mastoid opacification. Mild mucosal thickening in the sinuses Other: None CT CERVICAL SPINE FINDINGS Alignment: Straightening of the cervical spine. Trace retrolisthesis C4 on C5 and C5 on C6. Facet alignment is maintained Skull base and vertebrae: No acute fracture. No primary bone lesion or focal pathologic process. Soft tissues and spinal canal: No prevertebral fluid or swelling. No visible canal hematoma. Disc levels: Advanced degenerative changes C3 through C7. Bony fusion across C5-C6. Facet degenerative changes at multiple levels with foraminal stenosis. Upper chest: Negative. Other:  None IMPRESSION: 1. No definite CT evidence for acute intracranial abnormality. Suspected chronic left subdural effusion. Atrophy 2. Straightening of the cervical spine with degenerative changes. No acute osseous abnormality 3.  Chronic lytic/destructive process at the right clivus and involving the right cavernous sinus Electronically Signed   By: Donavan Foil M.D.   On: 01/24/2021 01:31   DG Pelvis Portable  Result Date: 01/23/2021 CLINICAL DATA:  Fall. EXAM: PORTABLE PELVIS 1-2 VIEWS COMPARISON:  Pelvic radiograph dated 06/27/2009. FINDINGS: Bilateral total hip arthroplasties. The arthroplasty components appear intact and in anatomic alignment. No acute fracture or dislocation. The bones are osteopenic. Penile implant noted. There is advanced atherosclerotic calcification of the aorta. The soft tissues are grossly unremarkable. IMPRESSION: 1. No acute fracture or dislocation. 2. Bilateral total hip arthroplasties. Electronically Signed   By: Anner Crete M.D.   On: 01/23/2021 22:25   CT Hip Left Wo Contrast  Result Date: 01/24/2021 CLINICAL DATA:  Left femoral fracture. EXAM: CT OF THE LEFT HIP WITHOUT CONTRAST TECHNIQUE: Multidetector CT imaging of the left hip was performed according to the standard protocol. Multiplanar CT image reconstructions were also generated. COMPARISON:  Left femoral series 01/23/2021. FINDINGS: Bones/Joint/Cartilage The bones are diffusely demineralized. Left hip total joint arthroplasty is again noted with an anterior proximal left femoral periprosthetic transverse fracture involving the anterior aspect of the greater trochanter extending medially and inferiorly to involve the anterior aspect of the proximal metaphysis of the bone, with the fracture fragment displaced anteriorly up to 1 cm. No displaced fracture is seen in the left pelvic ring, visualized right pelvic ring, left acetabulum and visualized lower left ilium. There are bridging osteophytes of the anterior  left SI joint, slight spurring of the pubic symphysis. There is a 1.5 cm irregular sclerotic lesion in the left superior pubic ramus, which was also visible on a CT of the abdomen and pelvis of 11/02/2015. Ligaments Suboptimally assessed by CT. Muscles and Tendons No intramuscular hematoma is seen. Tendon anatomy is poorly demonstrated due to artifact from the left hip arthroplasty. Soft tissues Penile implant apparatus incidentally noted. The visualized bladder is unremarkable. No pelvic free air or fluid or soft tissue mass are observed. Prostate is surgically absent. IMPRESSION: Acute transverse periprosthetic fracture of the anterior aspect of the proximal left femur, with anterior distraction of the fracture fragment up to 1 cm. Additional findings discussed above. Electronically Signed   By: Telford Nab M.D.   On: 01/24/2021 03:10   DG Chest Port 1 View  Result Date: 01/23/2021 CLINICAL DATA:  Fall. EXAM: PORTABLE CHEST 1 VIEW COMPARISON:  Chest x-ray 05/16/2019. FINDINGS: Left-sided ICD is again seen. The aorta is tortuous with atherosclerotic calcifications. The heart is enlarged. There are patchy airspace opacities in the left lung base. There is atelectasis in the right lung base. There is no pleural effusion or pneumothorax. There are few ill-defined small nodular densities in the right mid lung. No acute fractures are seen. IMPRESSION: 1. Left lower lobe airspace disease worrisome for infection. 2. Stable cardiomegaly. 3. There are few ill-defined nodular densities in the right mid lung, indeterminate. Findings may be infectious or inflammatory, but other etiologies are not excluded. Recommend short-term follow-up x-ray in 4-6 weeks to re-evaluate. Chest CT can be performed as clinically warranted. Electronically Signed   By: Ronney Asters M.D.   On: 01/23/2021 22:24   DG FEMUR PORT MIN 2 VIEWS LEFT  Result Date: 01/23/2021 CLINICAL DATA:  Fall. EXAM: LEFT FEMUR PORTABLE 2 VIEWS COMPARISON:   CT abdomen and pelvis 05/16/2019. FINDINGS: The bones are diffusely osteopenic. Left hip arthroplasty appears in anatomic alignment. On the frogleg lateral view of the left hip there is an  acute fracture of the proximal femur laterally fracture fragment distracted 1.4 mm. Left knee arthroplasty appears in anatomic alignment. There are extensive vascular calcifications in the soft tissues. Penile pump is present. IMPRESSION: 1. Left hip arthroplasty in anatomic alignment. There is acute fracture of the proximal left femur. 2. Left knee arthroplasty appears uncomplicated. Electronically Signed   By: Ronney Asters M.D.   On: 01/23/2021 22:28    Assessment/Plan  1. Periprosthetic fracture around internal prosthetic left hip joint, sequela -   Orthopedic was consulted and and recommended nonoperative and pain management -   WBAT -   For PT and OT, for therapeutic strengthening exercises -   Will discontinue tramadol and add to allergy list and start oxycodone 5 mg 1 tablet every 6 hours PRN for pain -    Follow-up with orthopedics, Dr. Pilar Plate Aluisio in 1-2 week  2. Stage 3a chronic kidney disease (CKD) (HCC) Lab Results  Component Value Date   NA 131 (L) 01/27/2021   K 4.2 01/27/2021   CO2 26 01/27/2021   GLUCOSE 132 (H) 01/27/2021   BUN 31 (H) 01/27/2021   CREATININE 1.20 01/27/2021   CALCIUM 8.5 (L) 01/27/2021   GFRNONAA 57 (L) 01/27/2021   -  S/P gentle IV hydration for dizziness  3. Chronic combined systolic and diastolic CHF (congestive heart failure) ([place]) -   Caregiver stated that patient is not allergic to torsemide but allergic to tramadol -   With restart torsemide 20 mg 1 tab daily and 20 mg 1 tab daily as needed for weight gain of 3 lbs in a day -    Weigh daily  4. Coronary artery disease involving native coronary artery of native heart without angina pectoris -Denies chest pain -   Continue NTG PRN and Plavix 75 mg 1 tab daily  5. Atrial fibrillation, permanent (HCC) -    Rate controlled, not on any anticoagulant -   Continue Plavix 75 mg daily  6.  Pressure ulcers of right heel stage I -   Apply foam dressing to right heel every 3 days and as needed   Family/ staff Communication: Discussed plan of care with resident and charge nurse.  Labs/tests ordered: BMP and CBC in 1 week  Goals of care:   Short-term care   Durenda Age, DNP, MSN, FNP-BC [facility] [phone] (Monday-Friday 8:00 a.m. - 5:00 p.m.) [phone] (after hours)

## 2021-01-31 ENCOUNTER — Non-Acute Institutional Stay (SKILLED_NURSING_FACILITY): Payer: Medicare HMO | Admitting: Internal Medicine

## 2021-01-31 ENCOUNTER — Encounter: Payer: Self-pay | Admitting: Adult Health

## 2021-01-31 ENCOUNTER — Encounter: Payer: Self-pay | Admitting: Internal Medicine

## 2021-01-31 ENCOUNTER — Other Ambulatory Visit: Payer: Self-pay | Admitting: Adult Health

## 2021-01-31 DIAGNOSIS — E46 Unspecified protein-calorie malnutrition: Secondary | ICD-10-CM | POA: Insufficient documentation

## 2021-01-31 DIAGNOSIS — E441 Mild protein-calorie malnutrition: Secondary | ICD-10-CM | POA: Diagnosis not present

## 2021-01-31 DIAGNOSIS — I5042 Chronic combined systolic (congestive) and diastolic (congestive) heart failure: Secondary | ICD-10-CM

## 2021-01-31 DIAGNOSIS — M9702XS Periprosthetic fracture around internal prosthetic left hip joint, sequela: Secondary | ICD-10-CM | POA: Diagnosis not present

## 2021-01-31 DIAGNOSIS — I4821 Permanent atrial fibrillation: Secondary | ICD-10-CM

## 2021-01-31 MED ORDER — OXYCODONE HCL 5 MG PO TABS: 5.0000 mg | ORAL_TABLET | Freq: Four times a day (QID) | ORAL | 0 refills | Status: DC | PRN

## 2021-01-31 NOTE — Assessment & Plan Note (Signed)
>>  ASSESSMENT AND PLAN FOR ATRIAL FIBRILLATION, PERMANENT (HCC) WRITTEN ON 01/31/2021  6:09 AM BY HOPPER, Titus Dubin, MD  Heart rhythm is slightly irregular; rate is well controlled.

## 2021-01-31 NOTE — Patient Instructions (Signed)
See assessment and plan under each diagnosis in the problem list and acutely for this visit 

## 2021-01-31 NOTE — Assessment & Plan Note (Signed)
Heart rhythm is slightly irregular; rate is well controlled.

## 2021-01-31 NOTE — Progress Notes (Signed)
NURSING HOME LOCATION:  [facility] ROOM NUMBER:  [place]  CODE STATUS:  Full Code  PCP:  Lawerance Cruel MD  This is a comprehensive admission note to this SNFperformed on this date less than 30 days from date of admission. Included are preadmission medical/surgical history; reconciled medication list; family history; social history and comprehensive review of systems.  Corrections and additions to the records were documented. Comprehensive physical exam was also performed. Additionally a clinical summary was entered for each active diagnosis pertinent to this admission in the Problem List to enhance continuity of care.  HPI: Patient was hospitalized 11/28 - 01/27/2021 after mechanical fall at home with immediate left thigh pain.  Periprosthetic hip fracture was documented on imaging.  Dr. Ronnie Derby recommended WBAT and nonoperative management with outpatient follow-up in 1-2 weeks postdischarge. He received gentle rehydration in the context of a history of congestive heart failure because of dizziness and slight elevation in creatinine from baseline.  Creatinine was 1.14 with GFR greater than 60; post rehydration creatinine was 1.20 with a GFR of 57 indicating CKD stage IIIa.  Initial hemoglobin/hematocrit 13/40 with a platelet count 125,000.  At discharge slight anemia was present with H/H of 12.7/37.5.  Platelet count dropped to 111,000.  No associated bleeding dyscrasias were documented. Protein caloric malnutrition was suggested by total protein of 5.5 and albumin of 3.3. During the hospitalization glucoses ranged from 123 up to 153.  The last A1c in Epic revealed a value of 5.4% in 2015. PT/OT recommended placement at SNF for rehab and pain control.  Past medical and surgical history:includes AF, BPH, CAD w hx of MI,Cardiomyopathy,Hx of CHF, CKD, history of diverticulosis/diverticulitis, and essential hypertension. Surgeries and procedures include cardioversion,  cholecystectomy, coronary angioplasty with stent placement, EP implantation, EGD, gamma knife radiation for meningioma, penile prosthesis implant,, multiple orthopedic procedures, and TURP.  Social history: Social alcohol intake; history of 14 pack years of smoking.  Family history: Family history non contributory due to advanced age.   Review of systems: Clinical neurocognitive deficits made validity of responses questionable ,hindering ROS completion.  He had just received pain medication and exhibited some confabulation.  He was lethargic and intermittently drifted off to sleep. When I asked why he had been hospitalized he stated "brain was not functioning".  He stated that he was ambulating to the bathroom employing his walker but fell when he tried to grip the handrails in the bathroom.  His caregiver states she was in the other room and it appeared as if he had tripped on the rug.  The confabulation included statements about" being transferred from cart to cart in the hospital" which caused him to "shake".  He stated that he "just wanted to run when I saw them coming".He could not tell me why he had a dressing over the right anterior chest.  Physical exam:  Pertinent or positive findings: He appears younger than his stated age.  As noted he was lethargic having just received pain medicine and intermittently fell back to sleep during the interview and exam.  Pattern alopecia is present.  Ptosis is greater on the left than the right.  There is malalignment of the mandibular teeth.  Rate was controlled; rhythm was slightly irregular.  Breath sounds were decreased.  He has trace edema at the sock line.  He has diffuse osteoarthritic changes of the hands.  Interosseous wasting is present.The upper extremities are stronger than lower extremities to opposition.  There is a small eschar  at the left frontal hairline.  He has a dressing over the right anterior chest.  Small venous nevus is present over the  right knee.  There is  an elongated operative scar of the right knee which is well-healed.  Eschars present over the left proximal lateral calf.  General appearance: Adequately nourished; no acute distress, increased work of breathing is present.   Lymphatic: No lymphadenopathy about the head, neck, axilla. Eyes: No conjunctival inflammation or lid edema is present. There is no scleral icterus. Ears:  External ear exam shows no significant lesions or deformities.   Nose:  External nasal examination shows no deformity or inflammation. Nasal mucosa are pink and moist without lesions, exudates Neck:  No thyromegaly, masses, tenderness noted.    Heart:  No gallop, murmur, click, rub.  Lungs:  without wheezes, rhonchi, rales, rubs. Abdomen: Bowel sounds are normal.  Abdomen is soft and nontender with no organomegaly, hernias, masses. GU: Deferred  Extremities:  No cyanosis, clubbing. Neurologic exam: Balance, Rhomberg, finger to nose testing could not be completed due to clinical state Skin: Warm & dry w/o tenting. No significant rash.  See clinical summary under each active problem in the Problem List with associated updated therapeutic plan

## 2021-01-31 NOTE — Assessment & Plan Note (Addendum)
CHF clinically compensated; no NVD or significant peripheral edema. No change in present cardiac regimen indicated. Possible "allergy" to Torsemide should be clarified when back to MS baseline

## 2021-01-31 NOTE — Assessment & Plan Note (Signed)
PT/OT @ SNF  Ortho F/U 1-2  Weeks post rehab

## 2021-01-31 NOTE — Assessment & Plan Note (Signed)
Current total protein 5.5, albumin 3.3 Interosseous wasting documented on physical Mild protein/ caloric malnutrition Nutrition consult at [facility]

## 2021-02-03 LAB — BASIC METABOLIC PANEL
BUN: 20 (ref 4–21)
CO2: 25 — AB (ref 13–22)
Chloride: 97 — AB (ref 99–108)
Creatinine: 0.9 (ref 0.6–1.3)
Glucose: 98
Potassium: 4.2 (ref 3.4–5.3)
Sodium: 132 — AB (ref 137–147)

## 2021-02-03 LAB — CBC: RBC: 3.57 — AB (ref 3.87–5.11)

## 2021-02-03 LAB — CBC AND DIFFERENTIAL
HCT: 33 — AB (ref 41–53)
Hemoglobin: 10.8 — AB (ref 13.5–17.5)
Platelets: 175 (ref 150–399)
WBC: 5.9

## 2021-02-03 LAB — COMPREHENSIVE METABOLIC PANEL
Calcium: 8.3 — AB (ref 8.7–10.7)
GFR calc Af Amer: 87.41
GFR calc non Af Amer: 75.42

## 2021-02-06 DIAGNOSIS — L89612 Pressure ulcer of right heel, stage 2: Secondary | ICD-10-CM | POA: Diagnosis not present

## 2021-02-08 DIAGNOSIS — Z96642 Presence of left artificial hip joint: Secondary | ICD-10-CM | POA: Diagnosis not present

## 2021-02-08 DIAGNOSIS — M25552 Pain in left hip: Secondary | ICD-10-CM | POA: Diagnosis not present

## 2021-02-09 ENCOUNTER — Telehealth: Payer: Self-pay

## 2021-02-09 ENCOUNTER — Other Ambulatory Visit: Payer: Self-pay | Admitting: Adult Health

## 2021-02-09 MED ORDER — DIAZEPAM 5 MG PO TABS: 2.5000 mg | ORAL_TABLET | Freq: Every day | ORAL | 0 refills | Status: AC | PRN

## 2021-02-09 NOTE — Telephone Encounter (Signed)
LMOVM for patient to send missed ICM transmission. 

## 2021-02-10 ENCOUNTER — Non-Acute Institutional Stay (SKILLED_NURSING_FACILITY): Payer: Medicare HMO | Admitting: Adult Health

## 2021-02-10 ENCOUNTER — Encounter: Payer: Self-pay | Admitting: Adult Health

## 2021-02-10 DIAGNOSIS — I2510 Atherosclerotic heart disease of native coronary artery without angina pectoris: Secondary | ICD-10-CM

## 2021-02-10 DIAGNOSIS — I5042 Chronic combined systolic (congestive) and diastolic (congestive) heart failure: Secondary | ICD-10-CM

## 2021-02-10 DIAGNOSIS — I4821 Permanent atrial fibrillation: Secondary | ICD-10-CM

## 2021-02-10 DIAGNOSIS — M25552 Pain in left hip: Secondary | ICD-10-CM

## 2021-02-10 DIAGNOSIS — M9702XS Periprosthetic fracture around internal prosthetic left hip joint, sequela: Secondary | ICD-10-CM | POA: Diagnosis not present

## 2021-02-10 NOTE — Progress Notes (Signed)
On  Location:  [place] Room Number: 308-A Place of Service:  SNF (31) Provider:  Durenda Age, DNP, FNP-BC  Patient Care Team: Lawerance Cruel, MD as PCP - General (Family Medicine) Jettie Booze, MD as PCP - Cardiology (Cardiology)  Extended Emergency Contact Information Primary Emergency Contact: Vancleve,Michael Address: [address]          [place], [place] [address] Johnnette Litter of [place] Phone: [phone] Mobile Phone: [phone] Relation: Son Secondary Emergency Contact: Nicklaus, Alviar Mobile Phone: [phone] Relation: Other  Code Status:  FULL CODE  Goals of care: Advanced Directive information Advanced Directives 02/10/2021  Does Patient Have a Medical Advance Directive? No  Type of Advance Directive -  Does patient want to make changes to medical advance directive? -  Copy of [place] in Chart? -  Would patient like information on creating a medical advance directive? No - Patient declined  Pre-existing out of facility DNR order (yellow form or pink MOST form) -     Chief Complaint  Patient presents with   Acute Visit    Short term rehabilitation visit.    HPI:  Pt is a 87 y.o. male seen today for medical management of chronic diseases. He is a short-term rehabilitation patient of [facility].He has a PMH of atrial fibrillation not on anticoagulation, CHF with EF 30 to 35%, history of heart block status post BiV pacer, CAD last stent 2015, chronic kidney disease stage IIIa with baseline creatinine 1.1-1.2 and prostate cancer.  He had a fall at home and sustained a periprosthetic hip fracture.  Orthopedics recommended nonoperative management and WBAT.  Left hip pain -  pain is 5/10, currently taking Oxy IR 5 mg every 6 hours PRN, cyclobenzaprine 5 mg 2.5 mg 3 times a day PRN for muscle spasm  Periprosthetic fracture around internal prosthetic left hip joint, sequela -   currently having PT and OT  Atrial fibrillation, permanent (HCC) -  rate-controlled, currently on Plavix 75 mg daily, not on anticoagulant  Coronary artery disease involving native coronary artery of native heart without angina pectoris -   denies chest pain, on Plavix and NTG PRN  Chronic combined systolic and diastolic CHF (congestive heart failure) (HCC) -  no SOB, torsemide 20 mg daily  Past Medical History:  Diagnosis Date   Anxiety    Arthritis    Atrial fibrillation, permanent ([place]) 01/07/2016   Blood transfusion    "related to a surgery"   BPH (benign prostatic hypertrophy) with urinary obstruction    s/p turp yrs ago   Bradycardia    a. Amio d/c'd 08/2013; brady arrest 08/2013 after PCI >>> recurrent AF >>> Amiodarone restarted. b. Pacemaker being considered in 11/2014.   CAD (coronary artery disease)    a. s/p MI and prior PCI of LAD;  b. LHC (08/2013):  prox LAD 60-70%, mid LAD stents ok, ostial lesion at Dx jailed by stent, mild CFX and RCA disesase >>>  PCI (09/08/13):  rotational atherectomy + Promus DES to prox LAD   Cardiomyopathy [facility])    a. Echo (08/2013):  EF 30-35%, AS hypokinesis, Gr 1 diast dysfn, mild MR, mild LAE >>> b. improved EF 50-55% by echo 8/15. c. EF down again by echo 12/2014 to 30-35% but 51% by nuc.   Chronic lower back pain    Chronic systolic CHF (congestive heart failure) (HCC)    CKD (chronic kidney disease), stage III ([place])    a.  Per review of labs baseline Cr 1.1-1.3.   DDD (degenerative disc disease)    chronic back pain   Depression    Diverticulitis of colon with bleeding    s/p sigmoid resection '88   DJD (degenerative joint disease) hips and knees   s/p bilateral total replacements   GERD (gastroesophageal reflux disease)    occ. take prevacid   Glaucoma    Hearing impaired person, right    History of GI diverticular bleed april 2012   transfused blood and resolved without surgical intervention   Hypertension    Impaired hearing  bilateral    only left hearing aid   Impotence, organic    s/p penile prosthesis 1990's   Incomplete bladder emptying    LBBB (left bundle branch block)    Meningioma (HCC) right -sided w/ right VI palsy   followed by dr Gaynell Face   Mitral regurgitation    a. Mild-mod by echo 12/2014.   Myocardial infarction ([place]) 1980's- medical intervention   "so mild I didn't know I'd had it"   PAF (paroxysmal atrial fibrillation) ([place])    not on coumadin due to hx of GI bleed.   Prostate cancer ([place]) 11/30/13   Gleason 8, volume 22.14 cc   Sleep apnea    non-compliant cpap   Uses hearing aid    left    Vitreous hemorrhage (HCC)    related to branch retinal vein occlusion left eye   Wears glasses    Past Surgical History:  Procedure Laterality Date   APPENDECTOMY     CARDIAC CATHETERIZATION  2007   noncritical cad (results w/ chart)   CARDIOVERSION N/A 10/13/2015   Procedure: CARDIOVERSION;  Surgeon: Josue Hector, MD;  Location: [facility] ENDOSCOPY;  Service: Cardiovascular;  Laterality: N/A;   CATARACT EXTRACTION W/ INTRAOCULAR LENS  IMPLANT, BILATERAL Bilateral ~ [place] Right "several"   CORONARY ANGIOPLASTY WITH STENT PLACEMENT  08-03-08   drug-eluting stent x2 distal and mid lad   EP IMPLANTABLE DEVICE N/A 01/31/2015   Procedure: BiV Pacemaker Insertion CRT-P;  Surgeon: Evans Lance, MD;  Location: [place] CV LAB;  Service: Cardiovascular;  Laterality: N/A;   EP IMPLANTABLE DEVICE N/A 02/07/2015   Procedure: Lead Revision/Repair;  Surgeon: Deboraha Sprang, MD;  Location: [place] CV LAB;  Service: Cardiovascular;  Laterality: N/A;   ESOPHAGOGASTRODUODENOSCOPY N/A 02/11/2014   Procedure: ESOPHAGOGASTRODUODENOSCOPY (EGD);  Surgeon: Cleotis Nipper, MD;  Location: [facility] ENDOSCOPY;  Service: Endoscopy;  Laterality: N/A;   gamma knife radiation  2000   [facility] for meningioma, last eval 2013- no change   GAS INSERTION Left 07/14/2019    Procedure: INSERTION OF GAS;  Surgeon: Hayden Pedro, MD;  Location: [place];  Service: Ophthalmology;  Laterality: Left;   INGUINAL HERNIA REPAIR Bilateral    INNER EAR SURGERY Right yrs ago   "trying to get my hearing back   LEFT HEART CATHETERIZATION WITH CORONARY ANGIOGRAM N/A 09/04/2013   Procedure: LEFT HEART CATHETERIZATION WITH CORONARY ANGIOGRAM;  Surgeon: Jettie Booze, MD;  Location: [facility] CATH LAB;  Service: Cardiovascular;  Laterality: N/A;   PARS PLANA VITRECTOMY Left 07/14/2019   27 GAUGE   PARS PLANA VITRECTOMY 27 GAUGE Left 07/14/2019   Procedure: PARS PLANA VITRECTOMY 27 GAUGE;  Surgeon: Hayden Pedro, MD;  Location: [place];  Service: Ophthalmology;  Laterality: Left;   PENILE PROSTHESIS IMPLANT  1990's   PERCUTANEOUS CORONARY ROTOBLATOR INTERVENTION (PCI-R)  N/A 09/08/2013   Procedure: PERCUTANEOUS CORONARY ROTOBLATOR INTERVENTION (PCI-R);  Surgeon: Jettie Booze, MD;  Location: [facility] CATH LAB;  Service: Cardiovascular;  Laterality: N/A;   PHOTOCOAGULATION WITH LASER Left 07/14/2019   Procedure: PHOTOCOAGULATION WITH LASER;  Surgeon: Hayden Pedro, MD;  Location: [place];  Service: Ophthalmology;  Laterality: Left;   PROSTATE BIOPSY  11/30/13   Gleason 8, vol 22.14 cc   REVISION TOTAL KNEE ARTHROPLASTY Right    SHOULDER OPEN ROTATOR CUFF REPAIR Left    TOTAL HIP ARTHROPLASTY Right 03-25-08--   TOTAL HIP ARTHROPLASTY Left 2005   TOTAL HIP REVISION Right 3-4 times   TOTAL KNEE ARTHROPLASTY Right 2004   TOTAL KNEE ARTHROPLASTY Left 1997   TRANSURETHRAL RESECTION OF PROSTATE  "years ago"   TRANSURETHRAL RESECTION OF PROSTATE  01/08/2011   Procedure: TRANSURETHRAL RESECTION OF THE PROSTATE (TURP);  Surgeon: Franchot Gallo;  Location: [facility];  Service: Urology;  Laterality: N/A;  GYRUS     Allergies  Allergen Reactions   Gabapentin     Other reaction(s): mood changes   Parecoxib Sodium [Parecoxib]    Tramadol Other (See Comments)     confusion   Codeine Anxiety and Other (See Comments)    Insomnia/ hyper   Omeprazole Nausea And Vomiting and Other (See Comments)    GI upset, insomnia   Warfarin Sodium Anxiety and Other (See Comments)    Hx GI bleed    Outpatient Encounter Medications as of 02/10/2021  Medication Sig   acetaminophen (TYLENOL) 500 MG tablet Take 2 tablets (1,000 mg total) by mouth every 8 (eight) hours as needed.   bisacodyl (DULCOLAX) 10 MG suppository Place 10 mg rectally as needed for moderate constipation. If not relieved by MOM   brimonidine (ALPHAGAN P) 0.1 % SOLN Place 1 drop into both eyes 2 (two) times daily.    clopidogrel (PLAVIX) 75 MG tablet TAKE 1 TABLET EVERY DAY   cyclobenzaprine (FLEXERIL) 5 MG tablet Take 2.5 mg by mouth 3 (three) times daily as needed for muscle spasms.    diazepam (VALIUM) 5 MG tablet Take 0.5 tablets (2.5 mg total) by mouth daily as needed for up to 14 days for anxiety.   dorzolamide-timolol (COSOPT) 22.3-6.8 MG/ML ophthalmic solution Place 1 drop into both eyes 2 (two) times daily.    esomeprazole (NEXIUM) 40 MG capsule Take 40 mg by mouth 2 (two) times daily.   hydrocortisone cream 1 % Apply 1 application topically 2 (two) times daily. Rash on Back.   magnesium hydroxide (MILK OF MAGNESIA) 400 MG/5ML suspension Take 30 mLs by mouth daily as needed for mild constipation. If no BM in 3 days   METAMUCIL FIBER PO Take 1 Scoop by mouth in the morning and at bedtime.   nitroGLYCERIN (NITROSTAT) 0.4 MG SL tablet Place 1 tablet (0.4 mg total) under the tongue every 5 (five) minutes as needed for chest pain.   ondansetron (ZOFRAN) 4 MG tablet Take 4 mg by mouth every 8 (eight) hours as needed for nausea or vomiting.   oxyCODONE (ROXICODONE) 5 MG immediate release tablet Take 1 tablet (5 mg total) by mouth every 6 (six) hours as needed for severe pain.   pantoprazole (PROTONIX) 40 MG tablet Take 1 tablet by mouth daily.   polyethylene glycol (MIRALAX / GLYCOLAX) 17 g packet  Take 17 g by mouth daily as needed.   Probiotic Product (ALIGN) 4 MG CAPS Take 1 capsule by mouth daily.   Sodium Phosphates (RA SALINE ENEMA  RE) Place 1 Bottle rectally daily as needed. If not relieved by Biscodyl suppository or Milk of Magnesia, Contact Dr. If no relief is given after Enema   tamsulosin (FLOMAX) 0.4 MG CAPS capsule Take 0.4 mg by mouth daily.   torsemide (DEMADEX) 20 MG tablet Take 20 mg by mouth as needed. Weight gain of 3lbs or more a day.   torsemide (DEMADEX) 20 MG tablet Take 20 mg by mouth daily.   traZODone (DESYREL) 50 MG tablet Take 50 mg by mouth at bedtime as needed for sleep.    [DISCONTINUED] Probiotic Product (PROBIOTIC PO) Take 1 tablet by mouth daily. ALIGN   No facility-administered encounter medications on file as of 02/10/2021.    Review of Systems  GENERAL: No change in appetite, no fatigue, no weight changes, no fever or chills  MOUTH and THROAT: Denies oral discomfort, gingival pain or bleeding RESPIRATORY: no cough, SOB, DOE, wheezing, hemoptysis CARDIAC: No chest pain, edema or palpitations GI: No abdominal pain, diarrhea, constipation, heart burn, nausea or vomiting GU: Denies dysuria, frequency, hematuria or discharge NEUROLOGICAL: Denies dizziness, syncope, numbness, or headache PSYCHIATRIC: Denies feelings of depression or anxiety. No report of hallucinations, insomnia, paranoia, or agitation   Immunization History  Administered Date(s) Administered   Influenza Split 02/01/2009, 01/06/2010, 01/05/2013   Influenza, High Dose Seasonal PF 01/21/2012, 12/04/2013, 11/15/2015, 01/07/2017, 01/10/2018, 12/09/2018, 01/18/2020   Influenza,inj,Quad PF,6+ Mos 12/14/2014   Influenza-Unspecified 12/27/2004, 12/27/2013, 01/12/2015, 10/27/2020   PFIZER(Purple Top)SARS-COV-2 Vaccination 03/17/2019, 04/07/2019   Pneumococcal Conjugate PCV 7 10/27/2020   Pneumococcal Conjugate-13 06/10/2014   Pneumococcal Polysaccharide-23 02/27/2004, 02/16/2005   Td  02/26/2005   Tdap 10/28/2019, 09/26/2020   Unspecified SARS-COV-2 Vaccination 02/27/2019, 03/30/2019, 09/27/2019   Zoster Recombinat (Shingrix) 02/08/2017   Zoster, Live 12/04/2013, 02/08/2017, 09/27/2019   Pertinent  Health Maintenance Due  Topic Date Due   INFLUENZA VACCINE  Completed   Fall Risk 01/25/2021 01/25/2021 01/26/2021 01/26/2021 01/27/2021  Falls in the past year? - - - - -  Was there an injury with Fall? - - - - -  Patient Fall Risk Level High fall risk High fall risk High fall risk High fall risk High fall risk  Patient at Risk for Falls Due to - - - - -  Patient at Risk for Falls Due to - - - - -  Fall risk Follow up - - - - -     Vitals:   02/10/21 0940  BP: 115/75  Pulse: 83  Resp: 17  Temp: (!) 97.2 F (36.2 C)  Weight: 185 lb 9.6 oz (84.2 kg)  Height: 5\' 8"  (1.727 m)   Body mass index is 28.22 kg/m.  Physical Exam  GENERAL APPEARANCE: Well nourished. In no acute distress.  SKIN:  Skin is warm and dry.  MOUTH and THROAT: Lips are without lesions. Oral mucosa is moist and without lesions.  RESPIRATORY: Breathing is even & unlabored, BS CTAB CARDIAC: RRR, no murmur,no extra heart sounds, no edema GI: Abdomen soft, normal BS, no masses, no tenderness NEUROLOGICAL: There is no tremor. Speech is clear. Alert and oriented X 3. PSYCHIATRIC:  Affect and behavior are appropriate  Labs reviewed: Recent Labs    01/24/21 0730 01/26/21 1038 01/27/21 0937 02/03/21 0000  NA 136 135 131* 132*  K 3.6 4.2 4.2 4.2  CL 104 102 99 97*  CO2 24 26 26  25*  GLUCOSE 123* 134* 132*  --   BUN 13 27* 31* 20  CREATININE 1.04 1.33* 1.20 0.9  CALCIUM  8.4* 8.8* 8.5* 8.3*   Recent Labs    01/23/21 2200  AST 23  ALT 18  ALKPHOS 84  BILITOT 0.6  PROT 5.5*  ALBUMIN 3.3*   Recent Labs    01/23/21 2200 01/23/21 2225 01/24/21 0730 01/26/21 1038 02/03/21 0000  WBC 6.6  --  7.7  --  5.9  HGB 13.0   < > 12.3* 12.7* 10.8*  HCT 40.0   < > 36.4* 37.5* 33*  MCV 94.8   --  93.6  --   --   PLT 125*  --  111*  --  175   < > = values in this interval not displayed.   Lab Results  Component Value Date   TSH 2.170 02/06/2019   Lab Results  Component Value Date   HGBA1C 5.4 02/10/2014   Lab Results  Component Value Date   CHOL 168 05/18/2019   HDL 36 (L) 05/18/2019   LDLCALC 116 (H) 05/18/2019   TRIG 79 05/18/2019   CHOLHDL 4.7 05/18/2019    Significant Diagnostic Results in last 30 days:  CT ABDOMEN PELVIS WO CONTRAST  Result Date: 01/24/2021 CLINICAL DATA:  Fall EXAM: CT CHEST, ABDOMEN AND PELVIS WITHOUT CONTRAST TECHNIQUE: Multidetector CT imaging of the chest, abdomen and pelvis was performed following the standard protocol without IV contrast. COMPARISON:  None. FINDINGS: CT CHEST FINDINGS Cardiovascular: Calcific aortic atherosclerosis. Coronary artery atherosclerotic calcification. No pericardial effusion. Mediastinum/Nodes: No mediastinal hematoma. No mediastinal, hilar or axillary lymphadenopathy. The visualized thyroid and thoracic esophageal course are unremarkable. Lungs/Pleura: No pulmonary contusion, pneumothorax or pleural effusion. The central airways are clear. Musculoskeletal: No acute fracture of the ribs, sternum or the visible portions of clavicles and scapulae. CT ABDOMEN PELVIS FINDINGS Hepatobiliary: No hepatic hematoma or laceration. No biliary dilatation. Status post cholecystectomy. Pancreas: Normal contours without ductal dilatation. No peripancreatic fluid collection. Spleen: No splenic laceration or hematoma. Adrenals/Urinary Tract: --Adrenal glands: No adrenal hemorrhage. --Right kidney/ureter: No hydronephrosis or perinephric hematoma. --Left kidney/ureter: No hydronephrosis or perinephric hematoma. --Urinary bladder: Unremarkable. Stomach/Bowel: --Stomach/Duodenum: No hiatal hernia or other gastric abnormality. Normal duodenal course and caliber. --Small bowel: No dilatation or inflammation. --Colon: No focal abnormality.  --Appendix: Normal. Vascular/Lymphatic: Atherosclerotic calcification is present within the non-aneurysmal abdominal aorta, without hemodynamically significant stenosis. No abdominal or pelvic lymphadenopathy. Reproductive: Penile prosthesis. Musculoskeletal. No pelvic fractures.  Bilateral hip prostheses. Other: None. IMPRESSION: 1. No acute abnormality of the chest, abdomen or pelvis. 2. Coronary artery and aortic Atherosclerosis (ICD10-I70.0). Electronically Signed   By: Ulyses Jarred M.D.   On: 01/24/2021 03:09   CT HEAD WO CONTRAST  Result Date: 01/24/2021 CLINICAL DATA:  Trauma EXAM: CT HEAD WITHOUT CONTRAST CT CERVICAL SPINE WITHOUT CONTRAST TECHNIQUE: Multidetector CT imaging of the head and cervical spine was performed following the standard protocol without intravenous contrast. Multiplanar CT image reconstructions of the cervical spine were also generated. COMPARISON:  CT brain 09/09/2018, 06/21/2015 FINDINGS: CT HEAD FINDINGS Brain: No acute territorial infarction or hemorrhage is visualized. Advanced atrophy. Slight asymmetric enlargement of left convexity CSF space, suspect that there may be chronic subdural effusion. Similar slight rightward shift. The ventricles are stable in size. Vascular: No hyperdense vessels. Vertebral and carotid vascular calcification. . Skull: Redemonstrated osseous destructive process at the right clavus and cavernous sinus. No fracture Sinuses/Orbits: Trace left mastoid opacification. Mild mucosal thickening in the sinuses Other: None CT CERVICAL SPINE FINDINGS Alignment: Straightening of the cervical spine. Trace retrolisthesis C4 on C5 and C5 on C6. Facet alignment is  maintained Skull base and vertebrae: No acute fracture. No primary bone lesion or focal pathologic process. Soft tissues and spinal canal: No prevertebral fluid or swelling. No visible canal hematoma. Disc levels: Advanced degenerative changes C3 through C7. Bony fusion across C5-C6. Facet degenerative  changes at multiple levels with foraminal stenosis. Upper chest: Negative. Other: None IMPRESSION: 1. No definite CT evidence for acute intracranial abnormality. Suspected chronic left subdural effusion. Atrophy 2. Straightening of the cervical spine with degenerative changes. No acute osseous abnormality 3. Chronic lytic/destructive process at the right clivus and involving the right cavernous sinus Electronically Signed   By: Donavan Foil M.D.   On: 01/24/2021 01:31   CT Chest Wo Contrast  Result Date: 01/24/2021 CLINICAL DATA:  Fall EXAM: CT CHEST, ABDOMEN AND PELVIS WITHOUT CONTRAST TECHNIQUE: Multidetector CT imaging of the chest, abdomen and pelvis was performed following the standard protocol without IV contrast. COMPARISON:  None. FINDINGS: CT CHEST FINDINGS Cardiovascular: Calcific aortic atherosclerosis. Coronary artery atherosclerotic calcification. No pericardial effusion. Mediastinum/Nodes: No mediastinal hematoma. No mediastinal, hilar or axillary lymphadenopathy. The visualized thyroid and thoracic esophageal course are unremarkable. Lungs/Pleura: No pulmonary contusion, pneumothorax or pleural effusion. The central airways are clear. Musculoskeletal: No acute fracture of the ribs, sternum or the visible portions of clavicles and scapulae. CT ABDOMEN PELVIS FINDINGS Hepatobiliary: No hepatic hematoma or laceration. No biliary dilatation. Status post cholecystectomy. Pancreas: Normal contours without ductal dilatation. No peripancreatic fluid collection. Spleen: No splenic laceration or hematoma. Adrenals/Urinary Tract: --Adrenal glands: No adrenal hemorrhage. --Right kidney/ureter: No hydronephrosis or perinephric hematoma. --Left kidney/ureter: No hydronephrosis or perinephric hematoma. --Urinary bladder: Unremarkable. Stomach/Bowel: --Stomach/Duodenum: No hiatal hernia or other gastric abnormality. Normal duodenal course and caliber. --Small bowel: No dilatation or inflammation. --Colon: No  focal abnormality. --Appendix: Normal. Vascular/Lymphatic: Atherosclerotic calcification is present within the non-aneurysmal abdominal aorta, without hemodynamically significant stenosis. No abdominal or pelvic lymphadenopathy. Reproductive: Penile prosthesis. Musculoskeletal. No pelvic fractures.  Bilateral hip prostheses. Other: None. IMPRESSION: 1. No acute abnormality of the chest, abdomen or pelvis. 2. Coronary artery and aortic Atherosclerosis (ICD10-I70.0). Electronically Signed   By: Ulyses Jarred M.D.   On: 01/24/2021 03:09   CT CERVICAL SPINE WO CONTRAST  Result Date: 01/24/2021 CLINICAL DATA:  Trauma EXAM: CT HEAD WITHOUT CONTRAST CT CERVICAL SPINE WITHOUT CONTRAST TECHNIQUE: Multidetector CT imaging of the head and cervical spine was performed following the standard protocol without intravenous contrast. Multiplanar CT image reconstructions of the cervical spine were also generated. COMPARISON:  CT brain 09/09/2018, 06/21/2015 FINDINGS: CT HEAD FINDINGS Brain: No acute territorial infarction or hemorrhage is visualized. Advanced atrophy. Slight asymmetric enlargement of left convexity CSF space, suspect that there may be chronic subdural effusion. Similar slight rightward shift. The ventricles are stable in size. Vascular: No hyperdense vessels. Vertebral and carotid vascular calcification. . Skull: Redemonstrated osseous destructive process at the right clavus and cavernous sinus. No fracture Sinuses/Orbits: Trace left mastoid opacification. Mild mucosal thickening in the sinuses Other: None CT CERVICAL SPINE FINDINGS Alignment: Straightening of the cervical spine. Trace retrolisthesis C4 on C5 and C5 on C6. Facet alignment is maintained Skull base and vertebrae: No acute fracture. No primary bone lesion or focal pathologic process. Soft tissues and spinal canal: No prevertebral fluid or swelling. No visible canal hematoma. Disc levels: Advanced degenerative changes C3 through C7. Bony fusion  across C5-C6. Facet degenerative changes at multiple levels with foraminal stenosis. Upper chest: Negative. Other: None IMPRESSION: 1. No definite CT evidence for acute intracranial abnormality. Suspected chronic left subdural  effusion. Atrophy 2. Straightening of the cervical spine with degenerative changes. No acute osseous abnormality 3. Chronic lytic/destructive process at the right clivus and involving the right cavernous sinus Electronically Signed   By: Donavan Foil M.D.   On: 01/24/2021 01:31   DG Pelvis Portable  Result Date: 01/23/2021 CLINICAL DATA:  Fall. EXAM: PORTABLE PELVIS 1-2 VIEWS COMPARISON:  Pelvic radiograph dated 06/27/2009. FINDINGS: Bilateral total hip arthroplasties. The arthroplasty components appear intact and in anatomic alignment. No acute fracture or dislocation. The bones are osteopenic. Penile implant noted. There is advanced atherosclerotic calcification of the aorta. The soft tissues are grossly unremarkable. IMPRESSION: 1. No acute fracture or dislocation. 2. Bilateral total hip arthroplasties. Electronically Signed   By: Anner Crete M.D.   On: 01/23/2021 22:25   CT Hip Left Wo Contrast  Result Date: 01/24/2021 CLINICAL DATA:  Left femoral fracture. EXAM: CT OF THE LEFT HIP WITHOUT CONTRAST TECHNIQUE: Multidetector CT imaging of the left hip was performed according to the standard protocol. Multiplanar CT image reconstructions were also generated. COMPARISON:  Left femoral series 01/23/2021. FINDINGS: Bones/Joint/Cartilage The bones are diffusely demineralized. Left hip total joint arthroplasty is again noted with an anterior proximal left femoral periprosthetic transverse fracture involving the anterior aspect of the greater trochanter extending medially and inferiorly to involve the anterior aspect of the proximal metaphysis of the bone, with the fracture fragment displaced anteriorly up to 1 cm. No displaced fracture is seen in the left pelvic ring, visualized  right pelvic ring, left acetabulum and visualized lower left ilium. There are bridging osteophytes of the anterior left SI joint, slight spurring of the pubic symphysis. There is a 1.5 cm irregular sclerotic lesion in the left superior pubic ramus, which was also visible on a CT of the abdomen and pelvis of 11/02/2015. Ligaments Suboptimally assessed by CT. Muscles and Tendons No intramuscular hematoma is seen. Tendon anatomy is poorly demonstrated due to artifact from the left hip arthroplasty. Soft tissues Penile implant apparatus incidentally noted. The visualized bladder is unremarkable. No pelvic free air or fluid or soft tissue mass are observed. Prostate is surgically absent. IMPRESSION: Acute transverse periprosthetic fracture of the anterior aspect of the proximal left femur, with anterior distraction of the fracture fragment up to 1 cm. Additional findings discussed above. Electronically Signed   By: Telford Nab M.D.   On: 01/24/2021 03:10   DG Chest Port 1 View  Result Date: 01/23/2021 CLINICAL DATA:  Fall. EXAM: PORTABLE CHEST 1 VIEW COMPARISON:  Chest x-ray 05/16/2019. FINDINGS: Left-sided ICD is again seen. The aorta is tortuous with atherosclerotic calcifications. The heart is enlarged. There are patchy airspace opacities in the left lung base. There is atelectasis in the right lung base. There is no pleural effusion or pneumothorax. There are few ill-defined small nodular densities in the right mid lung. No acute fractures are seen. IMPRESSION: 1. Left lower lobe airspace disease worrisome for infection. 2. Stable cardiomegaly. 3. There are few ill-defined nodular densities in the right mid lung, indeterminate. Findings may be infectious or inflammatory, but other etiologies are not excluded. Recommend short-term follow-up x-ray in 4-6 weeks to re-evaluate. Chest CT can be performed as clinically warranted. Electronically Signed   By: Ronney Asters M.D.   On: 01/23/2021 22:24   DG FEMUR  PORT MIN 2 VIEWS LEFT  Result Date: 01/23/2021 CLINICAL DATA:  Fall. EXAM: LEFT FEMUR PORTABLE 2 VIEWS COMPARISON:  CT abdomen and pelvis 05/16/2019. FINDINGS: The bones are diffusely osteopenic. Left hip arthroplasty  appears in anatomic alignment. On the frogleg lateral view of the left hip there is an acute fracture of the proximal femur laterally fracture fragment distracted 1.4 mm. Left knee arthroplasty appears in anatomic alignment. There are extensive vascular calcifications in the soft tissues. Penile pump is present. IMPRESSION: 1. Left hip arthroplasty in anatomic alignment. There is acute fracture of the proximal left femur. 2. Left knee arthroplasty appears uncomplicated. Electronically Signed   By: Ronney Asters M.D.   On: 01/23/2021 22:28    Assessment/Plan  1. Left hip pain -   discontinue acetaminophen 500 mg 2 tablets every 8 hours PRN and oxycodone IR 5 mg every 6 hours PRN -   will start on acetaminophen 500 mg 2 tabs = 1000 mg PO at 6:30 AM and 4:30 PM, acetaminophen 500 mg 2 tabs = 1000 mg PO daily PRN and Oxy IR 5 mg 1 tab PO every 12 hours PRN  2. Periprosthetic fracture around internal prosthetic left hip joint, sequela -   WBAT -   Continue PT and OT, for therapeutic strengthening exercises  3. Atrial fibrillation, permanent (HCC) -    Rate controlled, continue Plavix  4. Coronary artery disease involving native coronary artery of native heart without angina pectoris -    Denies chest pain, continue NTG as needed and Plavix  5. Chronic combined systolic and diastolic CHF (congestive heart failure) (HCC) -  no SOB, continue torsemide    Family/ staff Communication:   Discussed plan of care with resident and charge nurse.  Labs/tests ordered:   None  Goals of care:   Short-term care   Durenda Age, DNP, MSN, FNP-BC [facility] [phone] (Monday-Friday 8:00 a.m. - 5:00 p.m.) [phone] (after hours)

## 2021-02-13 DIAGNOSIS — L89612 Pressure ulcer of right heel, stage 2: Secondary | ICD-10-CM | POA: Diagnosis not present

## 2021-02-16 NOTE — Progress Notes (Signed)
No ICM remote transmission received for 02/06/2021 and next ICM transmission scheduled for 03/20/2021.

## 2021-02-24 ENCOUNTER — Non-Acute Institutional Stay (SKILLED_NURSING_FACILITY): Payer: Medicare HMO | Admitting: Adult Health

## 2021-02-24 ENCOUNTER — Encounter: Payer: Self-pay | Admitting: Adult Health

## 2021-02-24 DIAGNOSIS — M9702XS Periprosthetic fracture around internal prosthetic left hip joint, sequela: Secondary | ICD-10-CM

## 2021-02-24 DIAGNOSIS — I4821 Permanent atrial fibrillation: Secondary | ICD-10-CM | POA: Diagnosis not present

## 2021-02-24 DIAGNOSIS — I5042 Chronic combined systolic (congestive) and diastolic (congestive) heart failure: Secondary | ICD-10-CM | POA: Diagnosis not present

## 2021-02-24 NOTE — Progress Notes (Signed)
Location:  [facility] Room Number: 308-A Place of Service:  SNF (31) Provider:  Durenda Age, DNP, FNP-BC  Patient Care Team: Lawerance Cruel, MD as PCP - General (Family Medicine) Jettie Booze, MD as PCP - Cardiology (Cardiology)  Extended Emergency Contact Information Primary Emergency Contact: Salamone,Michael Address: [address]          [place], [place] [address] Johnnette Litter of [place] Phone: [phone] Mobile Phone: [phone] Relation: Son Secondary Emergency Contact: Rakin, Lemelle Mobile Phone: [phone] Relation: Other  Code Status:  Full Code  Goals of care: Advanced Directive information Advanced Directives 02/24/2021  Does Patient Have a Medical Advance Directive? No  Type of Advance Directive -  Does patient want to make changes to medical advance directive? -  Copy of [place] in Chart? -  Would patient like information on creating a medical advance directive? No - Patient declined  Pre-existing out of facility DNR order (yellow form or pink MOST form) -     Chief Complaint  Patient presents with   Acute Visit    Short term rehabilitation     HPI:  Pt is a 87 y.o. male seen today for a short-term rehabilitation visit. She is currently having PT and OT.  Chronic combined systolic and diastolic CHF (congestive heart failure) (HCC) -  no SOB, takes torsemide 20 mg 1 tab daily  Periprosthetic fracture around internal prosthetic left hip joint, sequela -   nonoperative management recommended by orthopedics, takes cyclobenzaprine 2.5 mg 3 times a day PRN for muscle spasm and oxycodone IR 5 mg 1 tab every 12 hours PRN for pain  Atrial fibrillation, permanent (HCC) -rate controlled, not on any rate-controlling nor anticoagulation    Past Medical History:  Diagnosis Date   Anxiety    Arthritis    Atrial fibrillation, permanent ([place]) 01/07/2016   Blood transfusion    "related to a  surgery"   BPH (benign prostatic hypertrophy) with urinary obstruction    s/p turp yrs ago   Bradycardia    a. Amio d/c'd 08/2013; brady arrest 08/2013 after PCI >>> recurrent AF >>> Amiodarone restarted. b. Pacemaker being considered in 11/2014.   CAD (coronary artery disease)    a. s/p MI and prior PCI of LAD;  b. LHC (08/2013):  prox LAD 60-70%, mid LAD stents ok, ostial lesion at Dx jailed by stent, mild CFX and RCA disesase >>>  PCI (09/08/13):  rotational atherectomy + Promus DES to prox LAD   Cardiomyopathy [facility])    a. Echo (08/2013):  EF 30-35%, AS hypokinesis, Gr 1 diast dysfn, mild MR, mild LAE >>> b. improved EF 50-55% by echo 8/15. c. EF down again by echo 12/2014 to 30-35% but 51% by nuc.   Chronic lower back pain    Chronic systolic CHF (congestive heart failure) (HCC)    CKD (chronic kidney disease), stage III ([place])    a. Per review of labs baseline Cr 1.1-1.3.   DDD (degenerative disc disease)    chronic back pain   Depression    Diverticulitis of colon with bleeding    s/p sigmoid resection '88   DJD (degenerative joint disease) hips and knees   s/p bilateral total replacements   GERD (gastroesophageal reflux disease)    occ. take prevacid   Glaucoma    Hearing impaired person, right    History of GI diverticular bleed april 2012   transfused blood and resolved without surgical intervention  Hypertension    Impaired hearing bilateral    only left hearing aid   Impotence, organic    s/p penile prosthesis 1990's   Incomplete bladder emptying    LBBB (left bundle branch block)    Meningioma (HCC) right -sided w/ right VI palsy   followed by dr Gaynell Face   Mitral regurgitation    a. Mild-mod by echo 12/2014.   Myocardial infarction ([place]) 1980's- medical intervention   "so mild I didn't know I'd had it"   PAF (paroxysmal atrial fibrillation) ([place])    not on coumadin due to hx of GI bleed.   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   Wears glasses    Past Surgical History:  Procedure Laterality Date   APPENDECTOMY     CARDIAC CATHETERIZATION  2007   noncritical cad (results w/ chart)   CARDIOVERSION N/A 10/13/2015   Procedure: CARDIOVERSION;  Surgeon: Josue Hector, MD;  Location[facility] ENDOSCOPY;  Service: Cardiovascular;  Laterality: N/A;   CATARACT EXTRACTION W/ INTRAOCULAR LENS  IMPLANT, BILATERAL Bilateral ~ Newman Right "several"   CORONARY ANGIOPLASTY WITH STENT PLACEMENT  08-03-08   drug-eluting stent x2 distal and mid lad   EP IMPLANTABLE DEVICE N/A 01/31/2015   Procedure: BiV Pacemaker Insertion CRT-P;  Surgeon: Evans Lance, MD;  Location: [facility] CV LAB;  Service: Cardiovascular;  Laterality: N/A;   EP IMPLANTABLE DEVICE N/A 02/07/2015   Procedure: Lead Revision/Repair;  Surgeon: Deboraha Sprang, MD;  Location: [place] CV LAB;  Service: Cardiovascular;  Laterality: N/A;   ESOPHAGOGASTRODUODENOSCOPY N/A 02/11/2014   Procedure: ESOPHAGOGASTRODUODENOSCOPY (EGD);  Surgeon: Cleotis Nipper, MD;  Location: [facility] ENDOSCOPY;  Service: Endoscopy;  Laterality: N/A;   gamma knife radiation  2000   [facility] for meningioma, last eval 2013- no change   GAS INSERTION Left 07/14/2019   Procedure: INSERTION OF GAS;  Surgeon: Hayden Pedro, MD;  Location: [facility];  Service: Ophthalmology;  Laterality: Left;   INGUINAL HERNIA REPAIR Bilateral    INNER EAR SURGERY Right yrs ago   "trying to get my hearing back   LEFT HEART CATHETERIZATION WITH CORONARY ANGIOGRAM N/A 09/04/2013   Procedure: LEFT HEART CATHETERIZATION WITH CORONARY ANGIOGRAM;  Surgeon: Jettie Booze, MD;  Location[facility] C[facility] LAB;  Service: Cardiovascular;  Laterality: N/A;   PARS PLANA VITRECTOMY Left 07/14/2019   27 GAUGE   PARS PLANA VITRECTOMY 27 GAUGE Left 07/14/2019    Procedure: PARS PLANA VITRECTOMY 27 GAUGE;  Surgeon: Hayden Pedro, MD;  Location: [facility];  Service: Ophthalmology;  Laterality: Left;   PENILE PROSTHESIS IMPLANT  1990's   PERCUTANEOUS CORONARY ROTOBLATOR INTERVENTION (PCI-R) N/A 09/08/2013   Procedure: PERCUTANEOUS CORONARY ROTOBLATOR INTERVENTION (PCI-R);  Surgeon: Jettie Booze, MD;  Location: [facility] C[facility] LAB;  Service: Cardiovascular;  Laterality: N/A;   PHOTOCOAGULATION WITH LASER Left 07/14/2019   Procedure: PHOTOCOAGULATION WITH LASER;  Surgeon: Hayden Pedro, MD;  Location: [place];  Service: Ophthalmology;  Laterality: Left;   PROSTATE BIOPSY  11/30/13   Gleason 8, vol 22.14 cc   REVISION TOTAL KNEE ARTHROPLASTY Right    SHOULDER OPEN ROTATOR CUFF REPAIR Left    TOTAL HIP ARTHROPLASTY Right 03-25-08--  TOTAL HIP ARTHROPLASTY Left 2005   TOTAL HIP REVISION Right 3-4 times   TOTAL KNEE ARTHROPLASTY Right 2004   TOTAL KNEE ARTHROPLASTY Left 1997   TRANSURETHRAL RESECTION OF PROSTATE  "years ago"   TRANSURETHRAL RESECTION OF PROSTATE  01/08/2011   Procedure: TRANSURETHRAL RESECTION OF THE PROSTATE (TURP);  Surgeon: Franchot Gallo;  Location: [facility];  Service: Urology;  Laterality: N/A;  GYRUS     Allergies  Allergen Reactions   Gabapentin     Other reaction(s): mood changes   Parecoxib Sodium [Parecoxib]    Tramadol Other (See Comments)    confusion   Codeine Anxiety and Other (See Comments)    Insomnia/ hyper   Omeprazole Nausea And Vomiting and Other (See Comments)    GI upset, insomnia   Warfarin Sodium Anxiety and Other (See Comments)    Hx GI bleed    Outpatient Encounter Medications as of 02/24/2021  Medication Sig   acetaminophen (TYLENOL) 500 MG tablet Take 1,000 mg by mouth as needed. TAKE 2 TABLETS (1,000MG ) BY MOUTH TWICE DAILY AT 6:00AM AND 4:30PM FOR PAIN   bisacodyl (DULCOLAX) 10 MG suppository Place 10 mg rectally as needed for moderate constipation. If not relieved by MOM    brimonidine (ALPHAGAN P) 0.1 % SOLN Place 1 drop into both eyes 2 (two) times daily.    clopidogrel (PLAVIX) 75 MG tablet TAKE 1 TABLET EVERY DAY   cyclobenzaprine (FLEXERIL) 5 MG tablet Take 2.5 mg by mouth 3 (three) times daily as needed for muscle spasms.    dorzolamide-timolol (COSOPT) 22.3-6.8 MG/ML ophthalmic solution Place 1 drop into both eyes 2 (two) times daily.    esomeprazole (NEXIUM) 40 MG capsule Take 40 mg by mouth 2 (two) times daily.   guaiFENesin (ROBITUSSIN) 100 MG/5ML liquid GIVE 10 ML BY MOUTH THREE TIMES DAILY FOR 2 WEEKS FOR COUGH   magnesium hydroxide (MILK OF MAGNESIA) 400 MG/5ML suspension Take 30 mLs by mouth daily as needed for mild constipation. If no BM in 3 days   METAMUCIL FIBER PO Take 1 Scoop by mouth in the morning and at bedtime.   nitroGLYCERIN (NITROSTAT) 0.4 MG SL tablet Place 1 tablet (0.4 mg total) under the tongue every 5 (five) minutes as needed for chest pain.   ondansetron (ZOFRAN) 4 MG tablet Take 4 mg by mouth every 8 (eight) hours as needed for nausea or vomiting.   oxyCODONE (ROXICODONE) 5 MG immediate release tablet Take 1 tablet (5 mg total) by mouth every 6 (six) hours as needed for severe pain.   pantoprazole (PROTONIX) 40 MG tablet Take 1 tablet by mouth daily.   polyethylene glycol (MIRALAX / GLYCOLAX) 17 g packet Take 17 g by mouth daily as needed.   Probiotic Product (ALIGN) 4 MG CAPS Take 1 capsule by mouth daily.   Sodium Phosphates (RA SALINE ENEMA RE) Place 1 Bottle rectally daily as needed. If not relieved by Biscodyl suppository or Milk of Magnesia, Contact Dr. If no relief is given after Enema   tamsulosin (FLOMAX) 0.4 MG CAPS capsule Take 0.4 mg by mouth daily.   torsemide (DEMADEX) 20 MG tablet Take 20 mg by mouth as needed. Weight gain of 3lbs or more a day.   torsemide (DEMADEX) 20 MG tablet Take 20 mg by mouth daily.   traZODone (DESYREL) 50 MG tablet Take 50 mg by mouth at bedtime as needed for sleep.    [DISCONTINUED]  acetaminophen (TYLENOL) 500 MG tablet Take 2 tablets (1,000 mg total) by  mouth every 8 (eight) hours as needed.   [DISCONTINUED] hydrocortisone cream 1 % Apply 1 application topically 2 (two) times daily. Rash on Back.   No facility-administered encounter medications on file as of 02/24/2021.    Review of Systems  Constitutional:  Negative for activity change, appetite change and fever.  HENT:  Negative for sore throat.   Eyes: Negative.   Cardiovascular:  Negative for chest pain and leg swelling.  Gastrointestinal:  Negative for abdominal distention, diarrhea and vomiting.  Genitourinary:  Negative for dysuria, frequency and urgency.  Skin:  Negative for color change.  Neurological:  Negative for dizziness and headaches.  Psychiatric/Behavioral:  Negative for behavioral problems and sleep disturbance. The patient is not nervous/anxious.       Immunization History  Administered Date(s) Administered   Influenza Split 02/01/2009, 01/06/2010, 01/05/2013   Influenza, High Dose Seasonal PF 01/21/2012, 12/04/2013, 11/15/2015, 01/07/2017, 01/10/2018, 12/09/2018, 01/18/2020   Influenza,inj,Quad PF,6+ Mos 12/14/2014   Influenza-Unspecified 12/27/2004, 12/27/2013, 01/12/2015, 10/27/2020   PFIZER(Purple Top)SARS-COV-2 Vaccination 03/17/2019, 04/07/2019   Pneumococcal Conjugate PCV 7 10/27/2020   Pneumococcal Conjugate-13 06/10/2014   Pneumococcal Polysaccharide-23 02/27/2004, 02/16/2005   Td 02/26/2005   Tdap 10/28/2019, 09/26/2020   Unspecified SARS-COV-2 Vaccination 02/27/2019, 03/30/2019, 09/27/2019   Zoster Recombinat (Shingrix) 02/08/2017   Zoster, Live 12/04/2013, 02/08/2017, 09/27/2019   Pertinent  Health Maintenance Due  Topic Date Due   INFLUENZA VACCINE  Completed   Fall Risk 01/25/2021 01/25/2021 01/26/2021 01/26/2021 01/27/2021  Falls in the past year? - - - - -  Was there an injury with Fall? - - - - -  Patient Fall Risk Level High fall risk High fall risk High fall risk High  fall risk High fall risk  Patient at Risk for Falls Due to - - - - -  Patient at Risk for Falls Due to - - - - -  Fall risk Follow up - - - - -     Vitals:   02/24/21 1515  BP: 121/82  Pulse: 88  Resp: 18  Temp: (!) 97.2 F (36.2 C)  SpO2: 93%  Weight: 152 lb 3.2 oz (69 kg)  Height: 5\' 8"  (1.727 m)   Body mass index is 23.14 kg/m.  Physical Exam Constitutional:      Appearance: Normal appearance.  HENT:     Head: Normocephalic and atraumatic.     Ears:     Comments: Right ear is hard of hearing, wears hearing aid on left ear    Mouth/Throat:     Mouth: Mucous membranes are moist.  Eyes:     Conjunctiva/sclera: Conjunctivae normal.  Cardiovascular:     Rate and Rhythm: Normal rate and regular rhythm.     Pulses: Normal pulses.     Heart sounds: Normal heart sounds.     Comments: Left chest pacemaker. Pulmonary:     Effort: Pulmonary effort is normal.     Breath sounds: Normal breath sounds.  Abdominal:     General: Bowel sounds are normal.     Palpations: Abdomen is soft.  Musculoskeletal:        General: No swelling. Normal range of motion.     Cervical back: Normal range of motion.  Skin:    General: Skin is warm and dry.  Neurological:     General: No focal deficit present.     Mental Status: He is alert and oriented to person, place, and time.  Psychiatric:        Mood and Affect: Mood  normal.        Behavior: Behavior normal.        Thought Content: Thought content normal.        Judgment: Judgment normal.       Labs reviewed: Recent Labs    01/24/21 0730 01/26/21 1038 01/27/21 0937 02/03/21 0000  NA 136 135 131* 132*  K 3.6 4.2 4.2 4.2  CL 104 102 99 97*  CO2 24 26 26  25*  GLUCOSE 123* 134* 132*  --   BUN 13 27* 31* 20  CREATININE 1.04 1.33* 1.20 0.9  CALCIUM 8.4* 8.8* 8.5* 8.3*   Recent Labs    01/23/21 2200  AST 23  ALT 18  ALKPHOS 84  BILITOT 0.6  PROT 5.5*  ALBUMIN 3.3*   Recent Labs    01/23/21 2200 01/23/21 2225  01/24/21 0730 01/26/21 1038 02/03/21 0000  WBC 6.6  --  7.7  --  5.9  HGB 13.0   < > 12.3* 12.7* 10.8*  HCT 40.0   < > 36.4* 37.5* 33*  MCV 94.8  --  93.6  --   --   PLT 125*  --  111*  --  175   < > = values in this interval not displayed.   Lab Results  Component Value Date   TSH 2.170 02/06/2019   Lab Results  Component Value Date   HGBA1C 5.4 02/10/2014   Lab Results  Component Value Date   CHOL 168 05/18/2019   HDL 36 (L) 05/18/2019   LDLCALC 116 (H) 05/18/2019   TRIG 79 05/18/2019   CHOLHDL 4.7 05/18/2019    Significant Diagnostic Results in last 30 days:  No results found.  Assessment/Plan  1. Chronic combined systolic and diastolic CHF (congestive heart failure) (HCC) The current medical regimen is effective;  continue present plan and medications.  2. Periprosthetic fracture around internal prosthetic left hip joint, sequela -Orthopedic was consulted and nonoperative management was recommended -   Continue PT and OT, for therapeutic strengthening exercises  3. Atrial fibrillation, permanent ([place]) -Rate controlled    Family/ staff Communication: Discussed plan of care with resident and charge nurse  Labs/tests ordered: None    Durenda Age, DNP, MSN, FNP-BC [facility] [phone] (Monday-Friday 8:00 a.m. - 5:00 p.m.) [phone] (after hours)

## 2021-02-27 DIAGNOSIS — L89612 Pressure ulcer of right heel, stage 2: Secondary | ICD-10-CM | POA: Diagnosis not present

## 2021-02-28 ENCOUNTER — Encounter: Payer: Self-pay | Admitting: Adult Health

## 2021-02-28 ENCOUNTER — Non-Acute Institutional Stay (SKILLED_NURSING_FACILITY): Payer: Medicare HMO | Admitting: Adult Health

## 2021-02-28 DIAGNOSIS — I5042 Chronic combined systolic (congestive) and diastolic (congestive) heart failure: Secondary | ICD-10-CM | POA: Diagnosis not present

## 2021-02-28 DIAGNOSIS — R059 Cough, unspecified: Secondary | ICD-10-CM | POA: Diagnosis not present

## 2021-02-28 DIAGNOSIS — I2510 Atherosclerotic heart disease of native coronary artery without angina pectoris: Secondary | ICD-10-CM | POA: Diagnosis not present

## 2021-02-28 DIAGNOSIS — S72041A Displaced fracture of base of neck of right femur, initial encounter for closed fracture: Secondary | ICD-10-CM | POA: Diagnosis not present

## 2021-02-28 LAB — CBC AND DIFFERENTIAL
HCT: 36 — AB (ref 41–53)
Hemoglobin: 12.3 — AB (ref 13.5–17.5)
Platelets: 121 — AB (ref 150–399)
WBC: 5.4

## 2021-02-28 LAB — CBC: RBC: 3.94 (ref 3.87–5.11)

## 2021-02-28 NOTE — Progress Notes (Signed)
Location:  [place] Room Number: 308-A Place of Service:  SNF (31) Provider:  Durenda Age, DNP, FNP-BC  Patient Care Team: Lawerance Cruel, MD as PCP - General (Family Medicine) Jettie Booze, MD as PCP - Cardiology (Cardiology)  Extended Emergency Contact Information Primary Emergency Contact: Wieland,Michael Address: [address]          [place], [place] [address] Johnnette Litter of [place] Phone: [phone] Mobile Phone: [phone] Relation: Son Secondary Emergency Contact: Daily, Crate Mobile Phone: [phone] Relation: Other  Code Status:  Full Code   Goals of care: Advanced Directive information Advanced Directives 02/28/2021  Does Patient Have a Medical Advance Directive? No  Type of Advance Directive -  Does patient want to make changes to medical advance directive? -  Copy of [place] in Chart? -  Would patient like information on creating a medical advance directive? No - Patient declined  Pre-existing out of facility DNR order (yellow form or pink MOST form) -     Chief Complaint  Patient presents with   Acute Visit    Cough     HPI:  Pt is a 88 y.o. male seen today for an acute visit regarding his coughing while eating. Staff nurse reported that he stated,"I think I chocked on my own saliva." Chest x-ray impression was patchy atelectasis in both lung bases. Digital x-ray was reviewed personally by Dr. Linna Darner, and compared to his chest x-ray done on 01/23/21. There was no change noted. No reported coughing, chills. He takes Torsemide 20 mg daily for CHF. No SOB noted.   Past Medical History:  Diagnosis Date   Anxiety    Arthritis    Atrial fibrillation, permanent ([place]) 01/07/2016   Blood transfusion    "related to a surgery"   BPH (benign prostatic hypertrophy) with urinary obstruction    s/p turp yrs ago   Bradycardia    a. Amio d/c'd 08/2013; brady arrest 08/2013 after PCI >>>  recurrent AF >>> Amiodarone restarted. b. Pacemaker being considered in 11/2014.   CAD (coronary artery disease)    a. s/p MI and prior PCI of LAD;  b. LHC (08/2013):  prox LAD 60-70%, mid LAD stents ok, ostial lesion at Dx jailed by stent, mild CFX and RCA disesase >>>  PCI (09/08/13):  rotational atherectomy + Promus DES to prox LAD   Cardiomyopathy [facility])    a. Echo (08/2013):  EF 30-35%, AS hypokinesis, Gr 1 diast dysfn, mild MR, mild LAE >>> b. improved EF 50-55% by echo 8/15. c. EF down again by echo 12/2014 to 30-35% but 51% by nuc.   Chronic lower back pain    Chronic systolic CHF (congestive heart failure) (HCC)    CKD (chronic kidney disease), stage III ([place])    a. Per review of labs baseline Cr 1.1-1.3.   DDD (degenerative disc disease)    chronic back pain   Depression    Diverticulitis of colon with bleeding    s/p sigmoid resection '88   DJD (degenerative joint disease) hips and knees   s/p bilateral total replacements   GERD (gastroesophageal reflux disease)    occ. take prevacid   Glaucoma    Hearing impaired person, right    History of GI diverticular bleed april 2012   transfused blood and resolved without surgical intervention   Hypertension    Impaired hearing bilateral    only left hearing aid   Impotence, organic    s/p penile  prosthesis 1990's   Incomplete bladder emptying    LBBB (left bundle branch block)    Meningioma (HCC) right -sided w/ right VI palsy   followed by dr Gaynell Face   Mitral regurgitation    a. Mild-mod by echo 12/2014.   Myocardial infarction ([place]) 1980's- medical intervention   "so mild I didn't know I'd had it"   PAF (paroxysmal atrial fibrillation) ([place])    not on coumadin due to hx of GI bleed.   Prostate cancer ([place]) 11/30/13   Gleason 8, volume 22.14 cc   Sleep apnea    non-compliant cpap   Uses hearing aid    left    Vitreous hemorrhage (HCC)    related to branch retinal vein occlusion left eye   Wears glasses    Past Surgical  History:  Procedure Laterality Date   APPENDECTOMY     CARDIAC CATHETERIZATION  2007   noncritical cad (results w/ chart)   CARDIOVERSION N/A 10/13/2015   Procedure: CARDIOVERSION;  Surgeon: Josue Hector, MD;  Location: [facility] ENDOSCOPY;  Service: Cardiovascular;  Laterality: N/A;   CATARACT EXTRACTION W/ INTRAOCULAR LENS  IMPLANT, BILATERAL Bilateral ~ [place] Right "several"   CORONARY ANGIOPLASTY WITH STENT PLACEMENT  08-03-08   drug-eluting stent x2 distal and mid lad   EP IMPLANTABLE DEVICE N/A 01/31/2015   Procedure: BiV Pacemaker Insertion CRT-P;  Surgeon: Evans Lance, MD;  Location: [place] CV LAB;  Service: Cardiovascular;  Laterality: N/A;   EP IMPLANTABLE DEVICE N/A 02/07/2015   Procedure: Lead Revision/Repair;  Surgeon: Deboraha Sprang, MD;  Location: [place] CV LAB;  Service: Cardiovascular;  Laterality: N/A;   ESOPHAGOGASTRODUODENOSCOPY N/A 02/11/2014   Procedure: ESOPHAGOGASTRODUODENOSCOPY (EGD);  Surgeon: Cleotis Nipper, MD;  Location: [facility] ENDOSCOPY;  Service: Endoscopy;  Laterality: N/A;   gamma knife radiation  2000   [facility] for meningioma, last eval 2013- no change   GAS INSERTION Left 07/14/2019   Procedure: INSERTION OF GAS;  Surgeon: Hayden Pedro, MD;  Location: [place];  Service: Ophthalmology;  Laterality: Left;   INGUINAL HERNIA REPAIR Bilateral    INNER EAR SURGERY Right yrs ago   "trying to get my hearing back   LEFT HEART CATHETERIZATION WITH CORONARY ANGIOGRAM N/A 09/04/2013   Procedure: LEFT HEART CATHETERIZATION WITH CORONARY ANGIOGRAM;  Surgeon: Jettie Booze, MD;  Location: [facility] CATH LAB;  Service: Cardiovascular;  Laterality: N/A;   PARS PLANA VITRECTOMY Left 07/14/2019   27 GAUGE   PARS PLANA VITRECTOMY 27 GAUGE Left 07/14/2019   Procedure: PARS PLANA VITRECTOMY 27 GAUGE;  Surgeon: Hayden Pedro, MD;  Location: [place];  Service: Ophthalmology;  Laterality: Left;   PENILE PROSTHESIS IMPLANT   1990's   PERCUTANEOUS CORONARY ROTOBLATOR INTERVENTION (PCI-R) N/A 09/08/2013   Procedure: PERCUTANEOUS CORONARY ROTOBLATOR INTERVENTION (PCI-R);  Surgeon: Jettie Booze, MD;  Location: [facility] CATH LAB;  Service: Cardiovascular;  Laterality: N/A;   PHOTOCOAGULATION WITH LASER Left 07/14/2019   Procedure: PHOTOCOAGULATION WITH LASER;  Surgeon: Hayden Pedro, MD;  Location: [place];  Service: Ophthalmology;  Laterality: Left;   PROSTATE BIOPSY  11/30/13   Gleason 8, vol 22.14 cc   REVISION TOTAL KNEE ARTHROPLASTY Right    SHOULDER OPEN ROTATOR CUFF REPAIR Left    TOTAL HIP ARTHROPLASTY Right 03-25-08--   TOTAL HIP ARTHROPLASTY Left 2005   TOTAL HIP REVISION Right 3-4 times   TOTAL KNEE ARTHROPLASTY Right 2004   TOTAL  KNEE ARTHROPLASTY Left 1997   TRANSURETHRAL RESECTION OF PROSTATE  "years ago"   TRANSURETHRAL RESECTION OF PROSTATE  01/08/2011   Procedure: TRANSURETHRAL RESECTION OF THE PROSTATE (TURP);  Surgeon: Franchot Gallo;  Location: [place];  Service: Urology;  Laterality: N/A;  GYRUS     Allergies  Allergen Reactions   Gabapentin     Other reaction(s): mood changes   Parecoxib Sodium [Parecoxib]    Tramadol Other (See Comments)    confusion   Codeine Anxiety and Other (See Comments)    Insomnia/ hyper   Omeprazole Nausea And Vomiting and Other (See Comments)    GI upset, insomnia   Warfarin Sodium Anxiety and Other (See Comments)    Hx GI bleed    Outpatient Encounter Medications as of 02/28/2021  Medication Sig   acetaminophen (TYLENOL) 500 MG tablet Take 1,000 mg by mouth as needed. TAKE 2 TABLETS (1,000MG ) BY MOUTH TWICE DAILY AT 6:00AM AND 4:30PM FOR PAIN   bisacodyl (DULCOLAX) 10 MG suppository Place 10 mg rectally as needed for moderate constipation. If not relieved by MOM   brimonidine (ALPHAGAN P) 0.1 % SOLN Place 1 drop into both eyes 2 (two) times daily.    clopidogrel (PLAVIX) 75 MG tablet TAKE 1 TABLET EVERY DAY   cyclobenzaprine  (FLEXERIL) 5 MG tablet Take 2.5 mg by mouth 3 (three) times daily as needed for muscle spasms.    dorzolamide-timolol (COSOPT) 22.3-6.8 MG/ML ophthalmic solution Place 1 drop into both eyes 2 (two) times daily.    esomeprazole (NEXIUM) 40 MG capsule Take 40 mg by mouth 2 (two) times daily.   guaiFENesin (ROBITUSSIN) 100 MG/5ML liquid GIVE 10 ML BY MOUTH THREE TIMES DAILY FOR 2 WEEKS FOR COUGH   ipratropium-albuterol (DUONEB) 0.5-2.5 (3) MG/3ML SOLN Take 3 mLs by nebulization every 8 (eight) hours as needed (x 72 hours).   magnesium hydroxide (MILK OF MAGNESIA) 400 MG/5ML suspension Take 30 mLs by mouth daily as needed for mild constipation. If no BM in 3 days   METAMUCIL FIBER PO Take 1 Scoop by mouth daily.   nitroGLYCERIN (NITROSTAT) 0.4 MG SL tablet Place 1 tablet (0.4 mg total) under the tongue every 5 (five) minutes as needed for chest pain.   ondansetron (ZOFRAN) 4 MG tablet Take 4 mg by mouth every 8 (eight) hours as needed for nausea or vomiting.   oxycodone (OXY-IR) 5 MG capsule Take 5 mg by mouth every 12 (twelve) hours as needed.   pantoprazole (PROTONIX) 40 MG tablet Take 1 tablet by mouth daily.   polyethylene glycol (MIRALAX / GLYCOLAX) 17 g packet Take 17 g by mouth daily as needed.   Probiotic Product (ALIGN) 4 MG CAPS Take 1 capsule by mouth daily.   Sodium Phosphates (RA SALINE ENEMA RE) Place 1 Bottle rectally daily as needed. If not relieved by Biscodyl suppository or Milk of Magnesia, Contact Dr. If no relief is given after Enema   tamsulosin (FLOMAX) 0.4 MG CAPS capsule Take 0.4 mg by mouth daily.   torsemide (DEMADEX) 20 MG tablet Take 20 mg by mouth as needed. Weight gain of 3lbs or more a day.   torsemide (DEMADEX) 20 MG tablet Take 20 mg by mouth daily.   traZODone (DESYREL) 50 MG tablet Take 50 mg by mouth at bedtime as needed for sleep.    [DISCONTINUED] oxyCODONE (ROXICODONE) 5 MG immediate release tablet Take 1 tablet (5 mg total) by mouth every 6 (six) hours as needed  for severe pain.  No facility-administered encounter medications on file as of 02/28/2021.    Review of Systems  Constitutional:  Negative for activity change, appetite change and fever.  HENT:  Negative for sore throat.   Eyes: Negative.   Cardiovascular:  Negative for chest pain and leg swelling.  Gastrointestinal:  Negative for abdominal distention, diarrhea and vomiting.  Genitourinary:  Negative for dysuria, frequency and urgency.  Skin:  Negative for color change.  Neurological:  Negative for dizziness and headaches.  Psychiatric/Behavioral:  Negative for behavioral problems and sleep disturbance. The patient is not nervous/anxious.       Immunization History  Administered Date(s) Administered   Influenza Split 02/01/2009, 01/06/2010, 01/05/2013   Influenza, High Dose Seasonal PF 01/21/2012, 12/04/2013, 11/15/2015, 01/07/2017, 01/10/2018, 12/09/2018, 01/18/2020   Influenza,inj,Quad PF,6+ Mos 12/14/2014   Influenza-Unspecified 12/27/2004, 12/27/2013, 01/12/2015, 10/27/2020   PFIZER(Purple Top)SARS-COV-2 Vaccination 03/17/2019, 04/07/2019   Pneumococcal Conjugate PCV 7 10/27/2020   Pneumococcal Conjugate-13 06/10/2014   Pneumococcal Polysaccharide-23 02/27/2004, 02/16/2005   Td 02/26/2005   Tdap 10/28/2019, 09/26/2020   Unspecified SARS-COV-2 Vaccination 02/27/2019, 03/30/2019, 09/27/2019   Zoster Recombinat (Shingrix) 02/08/2017   Zoster, Live 12/04/2013, 02/08/2017, 09/27/2019   Pertinent  Health Maintenance Due  Topic Date Due   INFLUENZA VACCINE  Completed   Fall Risk 01/25/2021 01/25/2021 01/26/2021 01/26/2021 01/27/2021  Falls in the past year? - - - - -  Was there an injury with Fall? - - - - -  Patient Fall Risk Level High fall risk High fall risk High fall risk High fall risk High fall risk  Patient at Risk for Falls Due to - - - - -  Patient at Risk for Falls Due to - - - - -  Fall risk Follow up - - - - -     Vitals:   02/28/21 1545  BP: 109/68  Pulse: 83   Resp: 20  Temp: (!) 97.5 F (36.4 C)  Weight: 185 lb 12.8 oz (84.3 kg)  Height: 5\' 8"  (1.727 m)   Body mass index is 28.25 kg/m.  Physical Exam Constitutional:      Appearance: Normal appearance.  HENT:     Head: Normocephalic and atraumatic.     Mouth/Throat:     Mouth: Mucous membranes are moist.  Eyes:     Conjunctiva/sclera: Conjunctivae normal.  Cardiovascular:     Rate and Rhythm: Normal rate and regular rhythm.     Pulses: Normal pulses.     Heart sounds: Normal heart sounds.  Pulmonary:     Effort: Pulmonary effort is normal.     Breath sounds: Normal breath sounds.  Abdominal:     General: Bowel sounds are normal.     Palpations: Abdomen is soft.  Musculoskeletal:        General: No swelling. Normal range of motion.     Cervical back: Normal range of motion.  Skin:    General: Skin is warm and dry.  Neurological:     General: No focal deficit present.     Mental Status: He is alert and oriented to person, place, and time.  Psychiatric:        Mood and Affect: Mood normal.        Behavior: Behavior normal.        Thought Content: Thought content normal.        Judgment: Judgment normal.       Labs reviewed: Recent Labs    01/24/21 0730 01/26/21 1038 01/27/21 0937 02/03/21 0000  NA 136 135  131* 132*  K 3.6 4.2 4.2 4.2  CL 104 102 99 97*  CO2 24 26 26  25*  GLUCOSE 123* 134* 132*  --   BUN 13 27* 31* 20  CREATININE 1.04 1.33* 1.20 0.9  CALCIUM 8.4* 8.8* 8.5* 8.3*   Recent Labs    01/23/21 2200  AST 23  ALT 18  ALKPHOS 84  BILITOT 0.6  PROT 5.5*  ALBUMIN 3.3*   Recent Labs    01/23/21 2200 01/23/21 2225 01/24/21 0730 01/26/21 1038 02/03/21 0000 02/28/21 0000  WBC 6.6  --  7.7  --  5.9 5.4  HGB 13.0   < > 12.3* 12.7* 10.8* 12.3*  HCT 40.0   < > 36.4* 37.5* 33* 36*  MCV 94.8  --  93.6  --   --   --   PLT 125*  --  111*  --  175 121*   < > = values in this interval not displayed.   Lab Results  Component Value Date   TSH 2.170  02/06/2019   Lab Results  Component Value Date   HGBA1C 5.4 02/10/2014   Lab Results  Component Value Date   CHOL 168 05/18/2019   HDL 36 (L) 05/18/2019   LDLCALC 116 (H) 05/18/2019   TRIG 79 05/18/2019   CHOLHDL 4.7 05/18/2019    Significant Diagnostic Results in last 30 days:  No results found.  Assessment/Plan  1. Cough in adult patient -   chest x-ray was negative for pneumonia -   will continue Guaifenesin 100 mg/5 ml give 10 ml PO TID for a total of 2 weeks  2. Chronic combined systolic and diastolic CHF (congestive heart failure) ([place]) -  stable, continue Torsemide    Family/ staff Communication: Discussed plan of care with resident and charge nurse  Labs/tests ordered:  CBC and BMP    Durenda Age, DNP, MSN, FNP-BC [facility] [phone] (Monday-Friday 8:00 a.m. - 5:00 p.m.) [phone] (after hours)

## 2021-03-01 DIAGNOSIS — S72302A Unspecified fracture of shaft of left femur, initial encounter for closed fracture: Secondary | ICD-10-CM | POA: Diagnosis not present

## 2021-03-01 DIAGNOSIS — S72041A Displaced fracture of base of neck of right femur, initial encounter for closed fracture: Secondary | ICD-10-CM | POA: Diagnosis not present

## 2021-03-01 DIAGNOSIS — M6281 Muscle weakness (generalized): Secondary | ICD-10-CM | POA: Diagnosis not present

## 2021-03-01 DIAGNOSIS — R130 Aphagia: Secondary | ICD-10-CM | POA: Diagnosis not present

## 2021-03-01 DIAGNOSIS — R41841 Cognitive communication deficit: Secondary | ICD-10-CM | POA: Diagnosis not present

## 2021-03-01 DIAGNOSIS — M9702XD Periprosthetic fracture around internal prosthetic left hip joint, subsequent encounter: Secondary | ICD-10-CM | POA: Diagnosis not present

## 2021-03-03 DIAGNOSIS — R130 Aphagia: Secondary | ICD-10-CM | POA: Diagnosis not present

## 2021-03-03 DIAGNOSIS — R41841 Cognitive communication deficit: Secondary | ICD-10-CM | POA: Diagnosis not present

## 2021-03-03 DIAGNOSIS — M6281 Muscle weakness (generalized): Secondary | ICD-10-CM | POA: Diagnosis not present

## 2021-03-03 DIAGNOSIS — S72302A Unspecified fracture of shaft of left femur, initial encounter for closed fracture: Secondary | ICD-10-CM | POA: Diagnosis not present

## 2021-03-03 DIAGNOSIS — S72041A Displaced fracture of base of neck of right femur, initial encounter for closed fracture: Secondary | ICD-10-CM | POA: Diagnosis not present

## 2021-03-03 DIAGNOSIS — M9702XD Periprosthetic fracture around internal prosthetic left hip joint, subsequent encounter: Secondary | ICD-10-CM | POA: Diagnosis not present

## 2021-03-06 DIAGNOSIS — S72302A Unspecified fracture of shaft of left femur, initial encounter for closed fracture: Secondary | ICD-10-CM | POA: Diagnosis not present

## 2021-03-06 DIAGNOSIS — S72041A Displaced fracture of base of neck of right femur, initial encounter for closed fracture: Secondary | ICD-10-CM | POA: Diagnosis not present

## 2021-03-06 DIAGNOSIS — R130 Aphagia: Secondary | ICD-10-CM | POA: Diagnosis not present

## 2021-03-06 DIAGNOSIS — M9702XD Periprosthetic fracture around internal prosthetic left hip joint, subsequent encounter: Secondary | ICD-10-CM | POA: Diagnosis not present

## 2021-03-06 DIAGNOSIS — R41841 Cognitive communication deficit: Secondary | ICD-10-CM | POA: Diagnosis not present

## 2021-03-06 DIAGNOSIS — M6281 Muscle weakness (generalized): Secondary | ICD-10-CM | POA: Diagnosis not present

## 2021-03-08 ENCOUNTER — Encounter: Payer: Self-pay | Admitting: Adult Health

## 2021-03-08 ENCOUNTER — Non-Acute Institutional Stay (SKILLED_NURSING_FACILITY): Payer: Medicare HMO | Admitting: Adult Health

## 2021-03-08 DIAGNOSIS — Z8719 Personal history of other diseases of the digestive system: Secondary | ICD-10-CM

## 2021-03-08 DIAGNOSIS — I5042 Chronic combined systolic (congestive) and diastolic (congestive) heart failure: Secondary | ICD-10-CM

## 2021-03-08 DIAGNOSIS — I4821 Permanent atrial fibrillation: Secondary | ICD-10-CM | POA: Diagnosis not present

## 2021-03-08 DIAGNOSIS — I2510 Atherosclerotic heart disease of native coronary artery without angina pectoris: Secondary | ICD-10-CM

## 2021-03-08 DIAGNOSIS — I25118 Atherosclerotic heart disease of native coronary artery with other forms of angina pectoris: Secondary | ICD-10-CM | POA: Diagnosis not present

## 2021-03-08 DIAGNOSIS — M9702XS Periprosthetic fracture around internal prosthetic left hip joint, sequela: Secondary | ICD-10-CM | POA: Diagnosis not present

## 2021-03-08 DIAGNOSIS — C61 Malignant neoplasm of prostate: Secondary | ICD-10-CM

## 2021-03-08 MED ORDER — NITROGLYCERIN 0.4 MG SL SUBL: 0.4000 mg | SUBLINGUAL_TABLET | SUBLINGUAL | 0 refills | Status: DC | PRN

## 2021-03-08 MED ORDER — CLOPIDOGREL BISULFATE 75 MG PO TABS: 75.0000 mg | ORAL_TABLET | Freq: Every day | ORAL | 0 refills | Status: DC

## 2021-03-08 MED ORDER — OXYCODONE HCL 5 MG PO CAPS: 5.0000 mg | ORAL_CAPSULE | Freq: Two times a day (BID) | ORAL | 0 refills | Status: DC | PRN

## 2021-03-08 MED ORDER — TORSEMIDE 20 MG PO TABS: 20.0000 mg | ORAL_TABLET | Freq: Every day | ORAL | 0 refills | Status: DC

## 2021-03-08 MED ORDER — TAMSULOSIN HCL 0.4 MG PO CAPS: 0.4000 mg | ORAL_CAPSULE | Freq: Every day | ORAL | 0 refills | Status: DC

## 2021-03-08 MED ORDER — CYCLOBENZAPRINE HCL 5 MG PO TABS: 2.5000 mg | ORAL_TABLET | Freq: Three times a day (TID) | ORAL | 0 refills | Status: DC | PRN

## 2021-03-08 MED ORDER — PANTOPRAZOLE SODIUM 40 MG PO TBEC: 40.0000 mg | DELAYED_RELEASE_TABLET | Freq: Every day | ORAL | 0 refills | Status: DC

## 2021-03-08 MED ORDER — ESOMEPRAZOLE MAGNESIUM 40 MG PO CPDR: 40.0000 mg | DELAYED_RELEASE_CAPSULE | Freq: Two times a day (BID) | ORAL | 0 refills | Status: DC

## 2021-03-08 NOTE — Progress Notes (Addendum)
Location:  [facility] Room Number: 308-A Place of Service:  SNF (31) Provider:  Durenda Age, DNP, FNP-BC  Patient Care Team: Lawerance Cruel, MD as PCP - General (Family Medicine) Jettie Booze, MD as PCP - Cardiology (Cardiology)  Extended Emergency Contact Information Primary Emergency Contact: Michelotti,Michael Address: [address]          [place], [place] [address] Johnnette Litter of [place] Phone: [phone] Mobile Phone: [phone] Relation: Son Secondary Emergency Contact: Savon, Bordonaro Mobile Phone: [phone] Relation: Other  Code Status:  Full Code  Goals of care: Advanced Directive information Advanced Directives 03/08/2021  Does Patient Have a Medical Advance Directive? No  Type of Advance Directive -  Does patient want to make changes to medical advance directive? -  Copy of [place] in Chart? -  Would patient like information on creating a medical advance directive? No - Patient declined  Pre-existing out of facility DNR order (yellow form or pink MOST form) -     Chief Complaint  Patient presents with   Discharge Note    Discharge from [facility]     HPI:  Pt is a 88 y.o. male who is for discharge home on 03/10/21 with Home health PT and OT.  He was admitted to [facility] on 01/27/21 post hospitalization 01/23/21 to 01/27/21. He has a PMH of atrial fibrillation not on anticoagulation, CHF with EF 30 to 35%, history of heart block S/P BiV pacer, CAD last stent 2015, chronic kidney disease is stage IIIa with baseline creatinine 1.1-1.2, and prostate cancer.  He had a mechanical fall at home and was brought to the ED.  Imaging done in the ED showed periprostatic hip fracture.  Orthopedic was consulted and recommended nonoperative management, WBAT. He was given gentle IV fluid for hydration for his dizziness.  Patient was admitted to this facility for  short-term rehabilitation after the patient's recent hospitalization.  Patient has completed SNF rehabilitation and therapy has cleared the patient for discharge.   Past Medical History:  Diagnosis Date   Anxiety    Arthritis    Atrial fibrillation, permanent ([place]) 01/07/2016   Blood transfusion    "related to a surgery"   BPH (benign prostatic hypertrophy) with urinary obstruction    s/p turp yrs ago   Bradycardia    a. Amio d/c'd 08/2013; brady arrest 08/2013 after PCI >>> recurrent AF >>> Amiodarone restarted. b. Pacemaker being considered in 11/2014.   CAD (coronary artery disease)    a. s/p MI and prior PCI of LAD;  b. LHC (08/2013):  prox LAD 60-70%, mid LAD stents ok, ostial lesion at Dx jailed by stent, mild CFX and RCA disesase >>>  PCI (09/08/13):  rotational atherectomy + Promus DES to prox LAD   Cardiomyopathy [facility])    a. Echo (08/2013):  EF 30-35%, AS hypokinesis, Gr 1 diast dysfn, mild MR, mild LAE >>> b. improved EF 50-55% by echo 8/15. c. EF down again by echo 12/2014 to 30-35% but 51% by nuc.   Chronic lower back pain    Chronic systolic CHF (congestive heart failure) (HCC)    CKD (chronic kidney disease), stage III ([place])    a. Per review of labs baseline Cr 1.1-1.3.   DDD (degenerative disc disease)    chronic back pain   Depression    Diverticulitis of colon with bleeding    s/p sigmoid resection '88   DJD (degenerative joint disease)  hips and knees   s/p bilateral total replacements   GERD (gastroesophageal reflux disease)    occ. take prevacid   Glaucoma    Hearing impaired person, right    History of GI diverticular bleed april 2012   transfused blood and resolved without surgical intervention   Hypertension    Impaired hearing bilateral    only left hearing aid   Impotence, organic    s/p penile prosthesis 1990's   Incomplete bladder emptying    LBBB (left bundle branch block)    Meningioma (HCC) right -sided w/ right VI palsy   followed by dr [place] Face    Mitral regurgitation    a. Mild-mod by echo 12/2014.   Myocardial infarction ([place]) 1980's- medical intervention   "so mild I didn't know I'd had it"   PAF (paroxysmal atrial fibrillation) ([place])    not on coumadin due to hx of GI bleed.   Prostate cancer ([place]) 11/30/13   Gleason 8, volume 22.14 cc   Sleep apnea    non-compliant cpap   Uses hearing aid    left    Vitreous hemorrhage (HCC)    related to branch retinal vein occlusion left eye   Wears glasses    Past Surgical History:  Procedure Laterality Date   APPENDECTOMY     CARDIAC CATHETERIZATION  2007   noncritical cad (results w/ chart)   CARDIOVERSION N/A 10/13/2015   Procedure: CARDIOVERSION;  Surgeon: Josue Hector, MD;  Location: [facility] ENDOSCOPY;  Service: Cardiovascular;  Laterality: N/A;   CATARACT EXTRACTION W/ INTRAOCULAR LENS  IMPLANT, BILATERAL Bilateral ~ [place] Right "several"   CORONARY ANGIOPLASTY WITH STENT PLACEMENT  08-03-08   drug-eluting stent x2 distal and mid lad   EP IMPLANTABLE DEVICE N/A 01/31/2015   Procedure: BiV Pacemaker Insertion CRT-P;  Surgeon: Evans Lance, MD;  Location: [facility] CV LAB;  Service: Cardiovascular;  Laterality: N/A;   EP IMPLANTABLE DEVICE N/A 02/07/2015   Procedure: Lead Revision/Repair;  Surgeon: Deboraha Sprang, MD;  Location: [facility] CV LAB;  Service: Cardiovascular;  Laterality: N/A;   ESOPHAGOGASTRODUODENOSCOPY N/A 02/11/2014   Procedure: ESOPHAGOGASTRODUODENOSCOPY (EGD);  Surgeon: Cleotis Nipper, MD;  Location: [facility] ENDOSCOPY;  Service: Endoscopy;  Laterality: N/A;   gamma knife radiation  2000   [facility] for meningioma, last eval 2013- no change   GAS INSERTION Left 07/14/2019   Procedure: INSERTION OF GAS;  Surgeon: Hayden Pedro, MD;  Location: [facility];  Service: Ophthalmology;  Laterality: Left;   INGUINAL HERNIA REPAIR Bilateral    INNER EAR SURGERY Right yrs ago   "trying to get my hearing back   LEFT HEART  CATHETERIZATION WITH CORONARY ANGIOGRAM N/A 09/04/2013   Procedure: LEFT HEART CATHETERIZATION WITH CORONARY ANGIOGRAM;  Surgeon: Jettie Booze, MD;  Location: [facility] CATH LAB;  Service: Cardiovascular;  Laterality: N/A;   PARS PLANA VITRECTOMY Left 07/14/2019   27 GAUGE   PARS PLANA VITRECTOMY 27 GAUGE Left 07/14/2019   Procedure: PARS PLANA VITRECTOMY 27 GAUGE;  Surgeon: Hayden Pedro, MD;  Location: [facility];  Service: Ophthalmology;  Laterality: Left;   PENILE PROSTHESIS IMPLANT  1990's   PERCUTANEOUS CORONARY ROTOBLATOR INTERVENTION (PCI-R) N/A 09/08/2013   Procedure: PERCUTANEOUS CORONARY ROTOBLATOR INTERVENTION (PCI-R);  Surgeon: Jettie Booze, MD;  Location: [facility] CATH LAB;  Service: Cardiovascular;  Laterality: N/A;   PHOTOCOAGULATION WITH LASER Left 07/14/2019   Procedure: PHOTOCOAGULATION WITH LASER;  Surgeon: Zigmund Daniel,  Chrystie Nose, MD;  Location: [facility];  Service: Ophthalmology;  Laterality: Left;   PROSTATE BIOPSY  11/30/13   Gleason 8, vol 22.14 cc   REVISION TOTAL KNEE ARTHROPLASTY Right    SHOULDER OPEN ROTATOR CUFF REPAIR Left    TOTAL HIP ARTHROPLASTY Right 03-25-08--   TOTAL HIP ARTHROPLASTY Left 2005   TOTAL HIP REVISION Right 3-4 times   TOTAL KNEE ARTHROPLASTY Right 2004   TOTAL KNEE ARTHROPLASTY Left 1997   TRANSURETHRAL RESECTION OF PROSTATE  "years ago"   TRANSURETHRAL RESECTION OF PROSTATE  01/08/2011   Procedure: TRANSURETHRAL RESECTION OF THE PROSTATE (TURP);  Surgeon: Franchot Gallo;  Location: [facility];  Service: Urology;  Laterality: N/A;  GYRUS     Allergies  Allergen Reactions   Gabapentin     Other reaction(s): mood changes   Parecoxib Sodium [Parecoxib]    Tramadol Other (See Comments)    confusion   Codeine Anxiety and Other (See Comments)    Insomnia/ hyper   Omeprazole Nausea And Vomiting and Other (See Comments)    GI upset, insomnia   Warfarin Sodium Anxiety and Other (See Comments)    Hx GI bleed    Outpatient Encounter  Medications as of 03/08/2021  Medication Sig   acetaminophen (TYLENOL) 500 MG tablet Take 1,000 mg by mouth as needed. And take two by mouth twice daily at 6 am and 4:30 pm for pain   bisacodyl (DULCOLAX) 10 MG suppository Place 10 mg rectally as needed for moderate constipation. If not relieved by MOM   brimonidine (ALPHAGAN P) 0.1 % SOLN Place 1 drop into both eyes 2 (two) times daily.    clopidogrel (PLAVIX) 75 MG tablet TAKE 1 TABLET EVERY DAY   cyclobenzaprine (FLEXERIL) 5 MG tablet Take 2.5 mg by mouth 3 (three) times daily as needed for muscle spasms.    dorzolamide-timolol (COSOPT) 22.3-6.8 MG/ML ophthalmic solution Place 1 drop into both eyes 2 (two) times daily.    esomeprazole (NEXIUM) 40 MG capsule Take 40 mg by mouth 2 (two) times daily.   magnesium hydroxide (MILK OF MAGNESIA) 400 MG/5ML suspension Take 30 mLs by mouth daily as needed for mild constipation. If no BM in 3 days   METAMUCIL FIBER PO Take 1 Scoop by mouth daily.   nitroGLYCERIN (NITROSTAT) 0.4 MG SL tablet Place 1 tablet (0.4 mg total) under the tongue every 5 (five) minutes as needed for chest pain.   Nutritional Supplements ([place]) LIQD Take 1 Can by mouth in the morning and at bedtime.   ondansetron (ZOFRAN) 4 MG tablet Take 4 mg by mouth every 8 (eight) hours as needed for nausea or vomiting.   oxycodone (OXY-IR) 5 MG capsule Take 5 mg by mouth every 12 (twelve) hours as needed.   pantoprazole (PROTONIX) 40 MG tablet Take 1 tablet by mouth daily.   polyethylene glycol (MIRALAX / GLYCOLAX) 17 g packet Take 17 g by mouth daily as needed.   Probiotic Product (ALIGN) 4 MG CAPS Take 1 capsule by mouth daily.   Sodium Phosphates (RA SALINE ENEMA RE) Place 1 Bottle rectally daily as needed. If not relieved by Biscodyl suppository or Milk of Magnesia, Contact Dr. If no relief is given after Enema   tamsulosin (FLOMAX) 0.4 MG CAPS capsule Take 0.4 mg by mouth daily.   torsemide (DEMADEX) 20 MG tablet Take  20 mg by mouth as needed. Weight gain of 3lbs or more a day. See scheduled listing also   torsemide (  DEMADEX) 20 MG tablet Take 20 mg by mouth daily. See as needed listing also   [DISCONTINUED] guaiFENesin (ROBITUSSIN) 100 MG/5ML liquid GIVE 10 ML BY MOUTH THREE TIMES DAILY FOR 2 WEEKS FOR COUGH   [DISCONTINUED] ipratropium-albuterol (DUONEB) 0.5-2.5 (3) MG/3ML SOLN Take 3 mLs by nebulization every 8 (eight) hours as needed (x 72 hours).   [DISCONTINUED] traZODone (DESYREL) 50 MG tablet Take 50 mg by mouth at bedtime as needed for sleep.    No facility-administered encounter medications on file as of 03/08/2021.    Review of Systems  Constitutional:  Negative for activity change, appetite change and fever.  HENT:  Negative for sore throat.   Eyes: Negative.   Cardiovascular:  Negative for chest pain and leg swelling.  Gastrointestinal:  Negative for abdominal distention, diarrhea and vomiting.  Genitourinary:  Negative for dysuria, frequency and urgency.  Skin:  Negative for color change.  Neurological:  Negative for dizziness and headaches.  Psychiatric/Behavioral:  Negative for behavioral problems and sleep disturbance. The patient is not nervous/anxious.       Immunization History  Administered Date(s) Administered   Influenza Split 02/01/2009, 01/06/2010, 01/05/2013   Influenza, High Dose Seasonal PF 01/21/2012, 12/04/2013, 11/15/2015, 01/07/2017, 01/10/2018, 12/09/2018, 01/18/2020   Influenza,inj,Quad PF,6+ Mos 12/14/2014   Influenza-Unspecified 12/27/2004, 12/27/2013, 01/12/2015, 10/27/2020   PFIZER(Purple Top)SARS-COV-2 Vaccination 03/17/2019, 04/07/2019   Pneumococcal Conjugate PCV 7 10/27/2020   Pneumococcal Conjugate-13 06/10/2014   Pneumococcal Polysaccharide-23 02/27/2004, 02/16/2005   Td 02/26/2005   Tdap 10/28/2019, 09/26/2020   Unspecified SARS-COV-2 Vaccination 02/27/2019, 03/30/2019, 09/27/2019   Zoster Recombinat (Shingrix) 02/08/2017   Zoster, Live 12/04/2013,  02/08/2017, 09/27/2019   Pertinent  Health Maintenance Due  Topic Date Due   INFLUENZA VACCINE  Completed   Fall Risk 01/25/2021 01/25/2021 01/26/2021 01/26/2021 01/27/2021  Falls in the past year? - - - - -  Was there an injury with Fall? - - - - -  Patient Fall Risk Level High fall risk High fall risk High fall risk High fall risk High fall risk  Patient at Risk for Falls Due to - - - - -  Patient at Risk for Falls Due to - - - - -  Fall risk Follow up - - - - -     Vitals:   03/08/21 0912  BP: 106/62  Pulse: 78  Resp: (!) 21  Temp: (!) 97.3 F (36.3 C)  Weight: 184 lb 3.2 oz (83.6 kg)  Height: 5\' 8"  (1.727 m)   Body mass index is 28.01 kg/m.  Physical Exam Constitutional:      Appearance: Normal appearance.  HENT:     Head: Normocephalic and atraumatic.     Nose: Nose normal.     Mouth/Throat:     Mouth: Mucous membranes are moist.  Eyes:     Conjunctiva/sclera: Conjunctivae normal.  Cardiovascular:     Rate and Rhythm: Normal rate and regular rhythm.     Comments: Has left chest pacemaker. Pulmonary:     Effort: Pulmonary effort is normal.     Breath sounds: Normal breath sounds.  Abdominal:     General: Bowel sounds are normal.     Palpations: Abdomen is soft.  Musculoskeletal:        General: No swelling. Normal range of motion.     Cervical back: Normal range of motion.  Skin:    General: Skin is warm and dry.  Neurological:     General: No focal deficit present.     Mental Status:  He is alert and oriented to person, place, and time.  Psychiatric:        Mood and Affect: Mood normal.        Behavior: Behavior normal.        Thought Content: Thought content normal.        Judgment: Judgment normal.       Labs reviewed: Recent Labs    01/24/21 0730 01/26/21 1038 01/27/21 0937 02/03/21 0000  NA 136 135 131* 132*  K 3.6 4.2 4.2 4.2  CL 104 102 99 97*  CO2 24 26 26  25*  GLUCOSE 123* 134* 132*  --   BUN 13 27* 31* 20  CREATININE 1.04 1.33*  1.20 0.9  CALCIUM 8.4* 8.8* 8.5* 8.3*   Recent Labs    01/23/21 2200  AST 23  ALT 18  ALKPHOS 84  BILITOT 0.6  PROT 5.5*  ALBUMIN 3.3*   Recent Labs    01/23/21 2200 01/23/21 2225 01/24/21 0730 01/26/21 1038 02/03/21 0000 02/28/21 0000  WBC 6.6  --  7.7  --  5.9 5.4  HGB 13.0   < > 12.3* 12.7* 10.8* 12.3*  HCT 40.0   < > 36.4* 37.5* 33* 36*  MCV 94.8  --  93.6  --   --   --   PLT 125*  --  111*  --  175 121*   < > = values in this interval not displayed.   Lab Results  Component Value Date   TSH 2.170 02/06/2019   Lab Results  Component Value Date   HGBA1C 5.4 02/10/2014   Lab Results  Component Value Date   CHOL 168 05/18/2019   HDL 36 (L) 05/18/2019   LDLCALC 116 (H) 05/18/2019   TRIG 79 05/18/2019   CHOLHDL 4.7 05/18/2019    Significant Diagnostic Results in last 30 days:  No results found.  Assessment/Plan  1. Periprosthetic fracture around internal prosthetic left hip joint, sequela -   Orthopedic was consulted and recommended nonoperative and pain management. -   WBAT -   For home health PT and OT, for therapeutic strengthening exercises -   Continue oxycodone IR 5 mg 1 tab every 12 hours PRN - cyclobenzaprine (FLEXERIL) 5 MG tablet; Take 0.5 tablets (2.5 mg total) by mouth 3 (three) times daily as needed for muscle spasms.  Dispense: 30 tablet; Refill: 0  2. Chronic combined systolic and diastolic CHF (congestive heart failure) (HCC) - torsemide (DEMADEX) 20 MG tablet; Take 1 tablet (20 mg total) by mouth daily. See as needed listing also  Dispense: 30 tablet; Refill: 0  3. Atrial fibrillation, permanent (HCC) -   Continue Plavix 75 mg daily  4. Coronary artery disease involving native coronary artery of native heart without angina pectoris -  Continue nitro the setting 0.4 mg 1 tab every 5 minutes as needed for chest pain - clopidogrel (PLAVIX) 75 MG tablet; Take 1 tablet (75 mg total) by mouth daily.  Dispense: 30 tablet; Refill: 0  5.  History of GI bleed - esomeprazole (NEXIUM) 40 MG capsule; Take 1 capsule (40 mg total) by mouth 2 (two) times daily.  Dispense: 60 capsule; Refill: 0 - pantoprazole (PROTONIX) 40 MG tablet; Take 1 tablet (40 mg total) by mouth daily.  Dispense: 30 tablet; Refill: 0  6. Prostate cancer ([place]) - tamsulosin (FLOMAX) 0.4 MG CAPS capsule; Take 1 capsule (0.4 mg total) by mouth daily.  Dispense: 30 capsule; Refill: 0     I have filled out  patient's discharge paperwork and e-prescribed medications.  Patient will have home health PT and OT.  DME provided:  wheelchair  Patient  suffers from periprosthetic fracture around internal prosthetic left hip joint which impairs his ability to perform daily activities like toileting, feeding, dressing, grooming and bathing in the home.  A walker or cane will not resolve issue with performing activities of daily living.  A wheelchair will allow patient to safely perform daily activities.  Patient has a caregiver who can provide assistance.   Total discharge time: Greater than 30 minutes Greater than 50% was spent in counseling and coordination of care.   Discharge time involved coordination of the discharge process with social worker, nursing staff and therapy department. Medical justification for home health services verified.   Durenda Age, DNP, MSN, FNP-BC [facility] [phone] (Monday-Friday 8:00 a.m. - 5:00 p.m.) [phone] (after hours)

## 2021-03-09 ENCOUNTER — Other Ambulatory Visit: Payer: Self-pay | Admitting: Adult Health

## 2021-03-09 DIAGNOSIS — Z8719 Personal history of other diseases of the digestive system: Secondary | ICD-10-CM

## 2021-03-09 DIAGNOSIS — I5042 Chronic combined systolic (congestive) and diastolic (congestive) heart failure: Secondary | ICD-10-CM

## 2021-03-11 DIAGNOSIS — I4821 Permanent atrial fibrillation: Secondary | ICD-10-CM | POA: Diagnosis not present

## 2021-03-11 DIAGNOSIS — I340 Nonrheumatic mitral (valve) insufficiency: Secondary | ICD-10-CM | POA: Diagnosis not present

## 2021-03-11 DIAGNOSIS — S72002D Fracture of unspecified part of neck of left femur, subsequent encounter for closed fracture with routine healing: Secondary | ICD-10-CM | POA: Diagnosis not present

## 2021-03-11 DIAGNOSIS — I5042 Chronic combined systolic (congestive) and diastolic (congestive) heart failure: Secondary | ICD-10-CM | POA: Diagnosis not present

## 2021-03-11 DIAGNOSIS — I447 Left bundle-branch block, unspecified: Secondary | ICD-10-CM | POA: Diagnosis not present

## 2021-03-11 DIAGNOSIS — I130 Hypertensive heart and chronic kidney disease with heart failure and stage 1 through stage 4 chronic kidney disease, or unspecified chronic kidney disease: Secondary | ICD-10-CM | POA: Diagnosis not present

## 2021-03-11 DIAGNOSIS — I2510 Atherosclerotic heart disease of native coronary artery without angina pectoris: Secondary | ICD-10-CM | POA: Diagnosis not present

## 2021-03-11 DIAGNOSIS — G8929 Other chronic pain: Secondary | ICD-10-CM | POA: Diagnosis not present

## 2021-03-11 DIAGNOSIS — N1831 Chronic kidney disease, stage 3a: Secondary | ICD-10-CM | POA: Diagnosis not present

## 2021-03-20 ENCOUNTER — Encounter (INDEPENDENT_AMBULATORY_CARE_PROVIDER_SITE_OTHER): Payer: Medicare HMO | Admitting: Ophthalmology

## 2021-03-21 NOTE — Progress Notes (Signed)
Unable to reach patient for monthly ICM remote follow up and not receiving monthly remote transmission.  Patient disenrolled due patient is not actively participating in ICM clinic.  Device clinic 91 day remote monitoring will continue per protocol.   ?

## 2021-03-22 ENCOUNTER — Encounter (INDEPENDENT_AMBULATORY_CARE_PROVIDER_SITE_OTHER): Payer: Medicare HMO | Admitting: Ophthalmology

## 2021-03-22 DIAGNOSIS — M549 Dorsalgia, unspecified: Secondary | ICD-10-CM | POA: Diagnosis not present

## 2021-03-22 DIAGNOSIS — N400 Enlarged prostate without lower urinary tract symptoms: Secondary | ICD-10-CM | POA: Diagnosis not present

## 2021-03-22 DIAGNOSIS — N183 Chronic kidney disease, stage 3 unspecified: Secondary | ICD-10-CM | POA: Diagnosis not present

## 2021-03-22 DIAGNOSIS — I10 Essential (primary) hypertension: Secondary | ICD-10-CM | POA: Diagnosis not present

## 2021-03-22 DIAGNOSIS — F419 Anxiety disorder, unspecified: Secondary | ICD-10-CM | POA: Diagnosis not present

## 2021-03-22 DIAGNOSIS — E7800 Pure hypercholesterolemia, unspecified: Secondary | ICD-10-CM | POA: Diagnosis not present

## 2021-03-22 DIAGNOSIS — F332 Major depressive disorder, recurrent severe without psychotic features: Secondary | ICD-10-CM | POA: Diagnosis not present

## 2021-03-22 DIAGNOSIS — R351 Nocturia: Secondary | ICD-10-CM | POA: Diagnosis not present

## 2021-03-23 ENCOUNTER — Encounter (INDEPENDENT_AMBULATORY_CARE_PROVIDER_SITE_OTHER): Payer: Medicare HMO | Admitting: Ophthalmology

## 2021-03-23 ENCOUNTER — Other Ambulatory Visit: Payer: Self-pay

## 2021-03-23 DIAGNOSIS — H35033 Hypertensive retinopathy, bilateral: Secondary | ICD-10-CM | POA: Diagnosis not present

## 2021-03-23 DIAGNOSIS — H353112 Nonexudative age-related macular degeneration, right eye, intermediate dry stage: Secondary | ICD-10-CM

## 2021-03-23 DIAGNOSIS — H34832 Tributary (branch) retinal vein occlusion, left eye, with macular edema: Secondary | ICD-10-CM

## 2021-03-23 DIAGNOSIS — H43811 Vitreous degeneration, right eye: Secondary | ICD-10-CM

## 2021-03-23 DIAGNOSIS — H353221 Exudative age-related macular degeneration, left eye, with active choroidal neovascularization: Secondary | ICD-10-CM

## 2021-03-23 DIAGNOSIS — I10 Essential (primary) hypertension: Secondary | ICD-10-CM | POA: Diagnosis not present

## 2021-04-05 ENCOUNTER — Ambulatory Visit (INDEPENDENT_AMBULATORY_CARE_PROVIDER_SITE_OTHER): Payer: Medicare HMO

## 2021-04-05 DIAGNOSIS — I442 Atrioventricular block, complete: Secondary | ICD-10-CM | POA: Diagnosis not present

## 2021-04-05 LAB — CUP PACEART REMOTE DEVICE CHECK
Battery Remaining Longevity: 28 mo
Battery Remaining Percentage: 29 %
Battery Voltage: 2.93 V
Date Time Interrogation Session: 20230207152940
Implantable Lead Implant Date: 20161205
Implantable Lead Implant Date: 20161205
Implantable Lead Implant Date: 20161205
Implantable Lead Location: 753858
Implantable Lead Location: 753859
Implantable Lead Location: 753860
Implantable Pulse Generator Implant Date: 20161205
Lead Channel Impedance Value: 1025 Ohm
Lead Channel Impedance Value: 480 Ohm
Lead Channel Pacing Threshold Amplitude: 0.75 V
Lead Channel Pacing Threshold Amplitude: 1 V
Lead Channel Pacing Threshold Pulse Width: 0.5 ms
Lead Channel Pacing Threshold Pulse Width: 0.8 ms
Lead Channel Sensing Intrinsic Amplitude: 12 mV
Lead Channel Setting Pacing Amplitude: 1.75 V
Lead Channel Setting Pacing Amplitude: 2.5 V
Lead Channel Setting Pacing Pulse Width: 0.5 ms
Lead Channel Setting Pacing Pulse Width: 0.8 ms
Lead Channel Setting Sensing Sensitivity: 2 mV
Pulse Gen Model: 3262
Pulse Gen Serial Number: 7802901

## 2021-04-10 NOTE — Progress Notes (Signed)
Remote pacemaker transmission.   

## 2021-04-14 ENCOUNTER — Ambulatory Visit: Payer: Medicare HMO | Admitting: Interventional Cardiology

## 2021-05-02 DIAGNOSIS — Z96641 Presence of right artificial hip joint: Secondary | ICD-10-CM | POA: Diagnosis not present

## 2021-05-02 DIAGNOSIS — G4700 Insomnia, unspecified: Secondary | ICD-10-CM | POA: Diagnosis not present

## 2021-05-02 DIAGNOSIS — Z8546 Personal history of malignant neoplasm of prostate: Secondary | ICD-10-CM | POA: Diagnosis not present

## 2021-05-02 DIAGNOSIS — F419 Anxiety disorder, unspecified: Secondary | ICD-10-CM | POA: Diagnosis not present

## 2021-05-02 DIAGNOSIS — N400 Enlarged prostate without lower urinary tract symptoms: Secondary | ICD-10-CM | POA: Diagnosis not present

## 2021-05-02 DIAGNOSIS — Z96653 Presence of artificial knee joint, bilateral: Secondary | ICD-10-CM | POA: Diagnosis not present

## 2021-05-02 DIAGNOSIS — Z951 Presence of aortocoronary bypass graft: Secondary | ICD-10-CM | POA: Diagnosis not present

## 2021-05-02 DIAGNOSIS — S72002D Fracture of unspecified part of neck of left femur, subsequent encounter for closed fracture with routine healing: Secondary | ICD-10-CM | POA: Diagnosis not present

## 2021-05-02 DIAGNOSIS — R1310 Dysphagia, unspecified: Secondary | ICD-10-CM | POA: Diagnosis not present

## 2021-05-02 DIAGNOSIS — K219 Gastro-esophageal reflux disease without esophagitis: Secondary | ICD-10-CM | POA: Diagnosis not present

## 2021-05-02 DIAGNOSIS — F32A Depression, unspecified: Secondary | ICD-10-CM | POA: Diagnosis not present

## 2021-05-02 DIAGNOSIS — H409 Unspecified glaucoma: Secondary | ICD-10-CM | POA: Diagnosis not present

## 2021-05-02 DIAGNOSIS — Z7902 Long term (current) use of antithrombotics/antiplatelets: Secondary | ICD-10-CM | POA: Diagnosis not present

## 2021-05-05 DIAGNOSIS — H532 Diplopia: Secondary | ICD-10-CM | POA: Diagnosis not present

## 2021-05-05 DIAGNOSIS — K029 Dental caries, unspecified: Secondary | ICD-10-CM | POA: Diagnosis not present

## 2021-05-05 DIAGNOSIS — H547 Unspecified visual loss: Secondary | ICD-10-CM | POA: Diagnosis not present

## 2021-05-05 DIAGNOSIS — H4920 Sixth [abducent] nerve palsy, unspecified eye: Secondary | ICD-10-CM | POA: Diagnosis not present

## 2021-05-25 ENCOUNTER — Encounter (INDEPENDENT_AMBULATORY_CARE_PROVIDER_SITE_OTHER): Payer: Medicare (Managed Care) | Admitting: Ophthalmology

## 2021-05-26 DIAGNOSIS — R933 Abnormal findings on diagnostic imaging of other parts of digestive tract: Secondary | ICD-10-CM | POA: Diagnosis not present

## 2021-05-26 DIAGNOSIS — R198 Other specified symptoms and signs involving the digestive system and abdomen: Secondary | ICD-10-CM | POA: Diagnosis not present

## 2021-05-26 DIAGNOSIS — K219 Gastro-esophageal reflux disease without esophagitis: Secondary | ICD-10-CM | POA: Diagnosis not present

## 2021-05-26 DIAGNOSIS — R1319 Other dysphagia: Secondary | ICD-10-CM | POA: Diagnosis not present

## 2021-05-30 DIAGNOSIS — R69 Illness, unspecified: Secondary | ICD-10-CM | POA: Diagnosis not present

## 2021-06-06 ENCOUNTER — Other Ambulatory Visit: Payer: Self-pay | Admitting: Adult Health

## 2021-06-06 ENCOUNTER — Other Ambulatory Visit: Payer: Self-pay | Admitting: Internal Medicine

## 2021-06-06 DIAGNOSIS — I2510 Atherosclerotic heart disease of native coronary artery without angina pectoris: Secondary | ICD-10-CM

## 2021-06-12 DIAGNOSIS — H53453 Other localized visual field defect, bilateral: Secondary | ICD-10-CM | POA: Diagnosis not present

## 2021-06-12 DIAGNOSIS — H519 Unspecified disorder of binocular movement: Secondary | ICD-10-CM | POA: Diagnosis not present

## 2021-06-12 DIAGNOSIS — Z86011 Personal history of benign neoplasm of the brain: Secondary | ICD-10-CM | POA: Diagnosis not present

## 2021-06-12 DIAGNOSIS — H4921 Sixth [abducent] nerve palsy, right eye: Secondary | ICD-10-CM | POA: Diagnosis not present

## 2021-06-15 ENCOUNTER — Encounter (INDEPENDENT_AMBULATORY_CARE_PROVIDER_SITE_OTHER): Payer: Medicare HMO | Admitting: Ophthalmology

## 2021-06-15 DIAGNOSIS — H353221 Exudative age-related macular degeneration, left eye, with active choroidal neovascularization: Secondary | ICD-10-CM

## 2021-06-15 DIAGNOSIS — H353112 Nonexudative age-related macular degeneration, right eye, intermediate dry stage: Secondary | ICD-10-CM | POA: Diagnosis not present

## 2021-06-15 DIAGNOSIS — I10 Essential (primary) hypertension: Secondary | ICD-10-CM

## 2021-06-15 DIAGNOSIS — H35033 Hypertensive retinopathy, bilateral: Secondary | ICD-10-CM

## 2021-06-15 DIAGNOSIS — H43811 Vitreous degeneration, right eye: Secondary | ICD-10-CM | POA: Diagnosis not present

## 2021-06-15 DIAGNOSIS — H34832 Tributary (branch) retinal vein occlusion, left eye, with macular edema: Secondary | ICD-10-CM

## 2021-06-19 ENCOUNTER — Telehealth: Payer: Self-pay | Admitting: *Deleted

## 2021-06-19 NOTE — Telephone Encounter (Signed)
? ?  Pre-operative Risk Assessment  ?  ?Patient Name: [name]  ?DOB: 1926-04-23 ?MRN: [id number]  ? ?  ? ?Request for Surgical Clearance   ? ?Procedure:   NEED OFFICE TO CALL BACK WITH INFORMATION; TRIED TO CALL BUT NO ANSWER ? ?Date of Surgery:  Clearance TBD                              ?   ?Surgeon:  DR. Watt Climes ?Surgeon's Group or Practice Name:  [facility] ?Phone number:  [phone] ?Fax number:  [phone] ?  ?Type of Clearance Requested:   ?- Medical  ?- Pharmacy:  Hold Clopidogrel (Plavix)   ?  ?Type of Anesthesia:   PROPOFOL  ?PROP  ?Additional requests/questions:   ? ?Signed, ?Julaine Hua   ?06/19/2021, 9:54 AM  ? ?

## 2021-06-19 NOTE — Telephone Encounter (Signed)
Called the pt in hopes that he may be able to help let me now which procedure he is having with Dr. Watt Climes. Clearance form did advise of either colonoscopy, endoscopy or both to be done. Pt begins to tell me that he has been having trouble going to the bathroom. I stated to the pt that it sounds like he will be a colonoscopy. I explained that I have tried x 2 to reach their office today, though I cannot get anyone to answer the phone. Pt states he will have them call me back right now to clarify the procedure. Pt asked my name, I told him Arbie Cookey.  ?

## 2021-06-19 NOTE — Telephone Encounter (Signed)
Will forward to pre op as to not cause any delays for the pt.  ?

## 2021-06-20 NOTE — Telephone Encounter (Signed)
Patient with hx of PAF, symptomatic bradycardia, chronic systolic heart failure with normalized EF, lung disease, hypertension, and is status post BiV PPM.  ? ?Seems patient on Plavix for afib.  ? ?Dr. Irish Lack, Can patient hold Plavix? Will need phone visit for clearance if can not wait unit office visit.  ? ?Please forward your response to P CV DIV PREOP.  ? ?Thank you  ?

## 2021-06-20 NOTE — Telephone Encounter (Signed)
Spoke with pt, he would like to have his procedures done sooner than 07/10/21.  Please advise! ?

## 2021-06-21 NOTE — Telephone Encounter (Signed)
Per Dr. Irish Lack "OK to hold PLavix 5 days prior.  He has stents and AFib.  GI bleed on ELiquis. Now Plavix only" ?

## 2021-06-26 NOTE — Telephone Encounter (Signed)
? ?  Patient Name: [name]  ?DOB: 02-Oct-1926 ?MRN: [id number] ? ?Primary Cardiologist: Larae Grooms, MD ? ?Chart reviewed as part of pre-operative protocol coverage. Patient has not been seen in >1 year (last OV 04/2020), however, procedure is scheduled for this Friday so needed clearance ASAP. History outlined to include CAD, PAF, symptomatic bradycardia, chronic systolic heart failure with normalized EF, lung disease, hypertension, tachy-brady s/p BiV PPM. Last intervention was complex PCI of his LAD in July 2015 involving rotational atherectomy. I reached out to the patient who affirms he has been doing well without any new cardiac symptoms. Therefore, based on ACC/AHA guidelines, the patient would be at acceptable risk for the planned procedure without further cardiovascular testing. The patient was advised that if he develops new symptoms prior to surgery to contact our office to arrange for a follow-up visit, and he verbalized understanding. Dr. Irish Lack has given permission for the patient to hold Plavix for 5 days prior and the patient is aware of this recommendation. Since patient was outside window of being seen in last year I did review with DOD Dr. Burt Knack who agrees with plan above. ? ?Will route this bundled recommendation to requesting provider via Epic fax function. Please call with questions. ? ?Charlie Pitter, PA-C ?06/26/2021, 8:55 AM ? ? ? ?

## 2021-06-28 DIAGNOSIS — R194 Change in bowel habit: Secondary | ICD-10-CM | POA: Diagnosis not present

## 2021-06-28 DIAGNOSIS — R1319 Other dysphagia: Secondary | ICD-10-CM | POA: Diagnosis not present

## 2021-06-30 DIAGNOSIS — K649 Unspecified hemorrhoids: Secondary | ICD-10-CM | POA: Diagnosis not present

## 2021-06-30 DIAGNOSIS — R194 Change in bowel habit: Secondary | ICD-10-CM | POA: Diagnosis not present

## 2021-06-30 DIAGNOSIS — Z980 Intestinal bypass and anastomosis status: Secondary | ICD-10-CM | POA: Diagnosis not present

## 2021-06-30 DIAGNOSIS — K5730 Diverticulosis of large intestine without perforation or abscess without bleeding: Secondary | ICD-10-CM | POA: Diagnosis not present

## 2021-06-30 DIAGNOSIS — D122 Benign neoplasm of ascending colon: Secondary | ICD-10-CM | POA: Diagnosis not present

## 2021-06-30 DIAGNOSIS — Z86010 Personal history of colonic polyps: Secondary | ICD-10-CM | POA: Diagnosis not present

## 2021-06-30 DIAGNOSIS — D120 Benign neoplasm of cecum: Secondary | ICD-10-CM | POA: Diagnosis not present

## 2021-07-04 DIAGNOSIS — D122 Benign neoplasm of ascending colon: Secondary | ICD-10-CM | POA: Diagnosis not present

## 2021-07-04 DIAGNOSIS — D120 Benign neoplasm of cecum: Secondary | ICD-10-CM | POA: Diagnosis not present

## 2021-07-05 ENCOUNTER — Ambulatory Visit: Payer: Medicare HMO

## 2021-07-09 NOTE — Progress Notes (Signed)
?  ?Cardiology Office Note ? ? ?Date:  07/10/2021  ? ?ID:  [name], DOB 1926/09/04, MRN [id number] ? ?PCP:  Lawerance Cruel, MD  ? ? ?No chief complaint on file. ? ?CAD ? ?Wt Readings from Last 3 Encounters:  ?07/10/21 181 lb (82.1 kg)  ?03/08/21 184 lb 3.2 oz (83.6 kg)  ?02/28/21 185 lb 12.8 oz (84.3 kg)  ?  ? ?  ?History of Present Illness: ?[name] is a 88 y.o. male  with prior AFib, CAD and mitral regurgitation.  He most recently had a complex PCI of his LAD in July 2015 involving rotational atherectomy. He had a hematoma in his right wrist at the time of the diagnostic cath. He had a right groin hematoma after the intervention. Prior to the intervention, his ejection fraction was in the 30-35% range. Echocardiogram about a month after the intervention showed an improvement in his EF from 50-55%. He continued to have some fatigue post procedure. He has had issues with his prostate and difficulty sleeping. ?  ?He then developed tachybrady syndrome and had a BiV - pacer placed. It took  He was found to have AFib.  He had GI bleeding with Warfarin.  He did not tolerate Eliquis.  Amio was started to maintain NSR, but he reverted to AFib even after DCCV.  Most recently, in 9/17, Amio and Warfarin were stopped and Plavix was restarted since he had taken this without bleeding issues in the past. ?  ?In June 2019, he had volume overload.  He received IV Lasix and felt better.  He has been stable on torsemide. ?  ?He has some acid reflux.  He takes Protonix.   ?He is limited by back pain.  Back is still painful.   ?  Elevates legs to reduce edema.  Overall, he is surprised that he lived as long as he has.  ? ?01/2021 hospital stay: "A. fib not on anticoagulation, CHF with EF 30-35%- improved to 55 in 2020, history of heart block status post BiV pacer, CAD last stent 2015, CKD 3A baseline creatinine 1.1-1.3, prostate cancer presents to the ED after a fall felt a mechanical fall at home when he slipped  and found to have severe left thigh pain and could not stand ?In the ED had multiple x-rays and scans including chest x-ray pelvis x-ray CT chest abdomen pelvis, CT C-spine, CT left hip CT head, found to have periprosthetic hip fracture orthopedic was consulted recommended WBAT, nonoperative management." ? ?Uses a stationary bike.  Still lives alone but family nearby to help. ? ?Past Medical History:  ?Diagnosis Date  ? Anxiety   ? Arthritis   ? Atrial fibrillation, permanent ([place]) 01/07/2016  ? Blood transfusion   ? "related to a surgery"  ? BPH (benign prostatic hypertrophy) with urinary obstruction   ? s/p turp yrs ago  ? Bradycardia   ? a. Amio d/c'd 08/2013; brady arrest 08/2013 after PCI >>> recurrent AF >>> Amiodarone restarted. b. Pacemaker being considered in 11/2014.  ? CAD (coronary artery disease)   ? a. s/p MI and prior PCI of LAD;  b. LHC (08/2013):  prox LAD 60-70%, mid LAD stents ok, ostial lesion at Dx jailed by stent, mild CFX and RCA disesase >>>  PCI (09/08/13):  rotational atherectomy + Promus DES to prox LAD  ? Cardiomyopathy ([place])   ? a. Echo (08/2013):  EF 30-35%, AS hypokinesis, Gr 1 diast dysfn, mild MR, mild LAE >>> b. improved EF  50-55% by echo 8/15. c. EF down again by echo 12/2014 to 30-35% but 51% by nuc.  ? Chronic lower back pain   ? Chronic systolic CHF (congestive heart failure) ([place])   ? CKD (chronic kidney disease), stage III ([place])   ? a. Per review of labs baseline Cr 1.1-1.3.  ? DDD (degenerative disc disease)   ? chronic back pain  ? Depression   ? Diverticulitis of colon with bleeding   ? s/p sigmoid resection '88  ? DJD (degenerative joint disease) hips and knees  ? s/p bilateral total replacements  ? GERD (gastroesophageal reflux disease)   ? occ. take prevacid  ? Glaucoma   ? Hearing impaired person, right   ? History of GI diverticular bleed april 2012  ? transfused blood and resolved without surgical intervention  ? Hypertension   ? Impaired hearing bilateral   ? only left  hearing aid  ? Impotence, organic   ? s/p penile prosthesis 1990's  ? Incomplete bladder emptying   ? LBBB (left bundle branch block)   ? Meningioma ([place]) right -sided w/ right VI palsy  ? followed by dr Gaynell Face  ? Mitral regurgitation   ? a. Mild-mod by echo 12/2014.  ? Myocardial infarction [facility]) 1980's- medical intervention  ? "so mild I didn't know I'd had it"  ? PAF (paroxysmal atrial fibrillation) ([place])   ? not on coumadin due to hx of GI bleed.  ? Prostate cancer ([place]) 11/30/13  ? Gleason 8, volume 22.14 cc  ? Sleep apnea   ? non-compliant cpap  ? Uses hearing aid   ? left   ? Vitreous hemorrhage ([place])   ? related to branch retinal vein occlusion left eye  ? Wears glasses   ? ? ?Past Surgical History:  ?Procedure Laterality Date  ? APPENDECTOMY    ? CARDIAC CATHETERIZATION  2007  ? noncritical cad (results w/ chart)  ? CARDIOVERSION N/A 10/13/2015  ? Procedure: CARDIOVERSION;  Surgeon: Josue Hector, MD;  Location: [place];  Service: Cardiovascular;  Laterality: N/A;  ? CATARACT EXTRACTION W/ INTRAOCULAR LENS  IMPLANT, BILATERAL Bilateral ~ 2000  ? CHOLECYSTECTOMY    ? CLOSED REDUCTION HIP DISLOCATION Right "several"  ? CORONARY ANGIOPLASTY WITH STENT PLACEMENT  08-03-08  ? drug-eluting stent x2 distal and mid lad  ? EP IMPLANTABLE DEVICE N/A 01/31/2015  ? Procedure: BiV Pacemaker Insertion CRT-P;  Surgeon: Evans Lance, MD;  Location: [place] [facility];  Service: Cardiovascular;  Laterality: N/A;  ? EP IMPLANTABLE DEVICE N/A 02/07/2015  ? Procedure: Lead Revision/Repair;  Surgeon: Deboraha Sprang, MD;  Location: [place] [facility];  Service: Cardiovascular;  Laterality: N/A;  ? ESOPHAGOGASTRODUODENOSCOPY N/A 02/11/2014  ? Procedure: ESOPHAGOGASTRODUODENOSCOPY (EGD);  Surgeon: Cleotis Nipper, MD;  Location: [facility] ENDOSCOPY;  Service: Endoscopy;  Laterality: N/A;  ? gamma knife radiation  2000  ? [facility] for meningioma, last eval 2013- no change  ? GAS INSERTION Left 07/14/2019  ? Procedure: INSERTION OF  GAS;  Surgeon: Hayden Pedro, MD;  Location: [place];  Service: Ophthalmology;  Laterality: Left;  ? INGUINAL HERNIA REPAIR Bilateral   ? INNER EAR SURGERY Right yrs ago  ? "trying to get my hearing back  ? LEFT HEART CATHETERIZATION WITH CORONARY ANGIOGRAM N/A 09/04/2013  ? Procedure: LEFT HEART CATHETERIZATION WITH CORONARY ANGIOGRAM;  Surgeon: Jettie Booze, MD;  Location: [facility] CATH LAB;  Service: Cardiovascular;  Laterality: N/A;  ? PARS PLANA VITRECTOMY Left 07/14/2019  ? 27  GAUGE  ? PARS PLANA VITRECTOMY 27 GAUGE Left 07/14/2019  ? Procedure: PARS PLANA VITRECTOMY 27 GAUGE;  Surgeon: Hayden Pedro, MD;  Location: [place];  Service: Ophthalmology;  Laterality: Left;  ? PENILE PROSTHESIS IMPLANT  1990's  ? PERCUTANEOUS CORONARY ROTOBLATOR INTERVENTION (PCI-R) N/A 09/08/2013  ? Procedure: PERCUTANEOUS CORONARY ROTOBLATOR INTERVENTION (PCI-R);  Surgeon: Jettie Booze, MD;  Location: [facility] CATH LAB;  Service: Cardiovascular;  Laterality: N/A;  ? PHOTOCOAGULATION WITH LASER Left 07/14/2019  ? Procedure: PHOTOCOAGULATION WITH LASER;  Surgeon: Hayden Pedro, MD;  Location: [place];  Service: Ophthalmology;  Laterality: Left;  ? PROSTATE BIOPSY  11/30/13  ? Gleason 8, vol 22.14 cc  ? REVISION TOTAL KNEE ARTHROPLASTY Right   ? SHOULDER OPEN ROTATOR CUFF REPAIR Left   ? TOTAL HIP ARTHROPLASTY Right 03-25-08--  ? TOTAL HIP ARTHROPLASTY Left 2005  ? TOTAL HIP REVISION Right 3-4 times  ? TOTAL KNEE ARTHROPLASTY Right 2004  ? TOTAL KNEE ARTHROPLASTY Left 1997  ? TRANSURETHRAL RESECTION OF PROSTATE  "years ago"  ? TRANSURETHRAL RESECTION OF PROSTATE  01/08/2011  ? Procedure: TRANSURETHRAL RESECTION OF THE PROSTATE (TURP);  Surgeon: Franchot Gallo;  Location: [place];  Service: Urology;  Laterality: N/A;  [place] ?  ? ? ? ?Current Outpatient Medications  ?Medication Sig Dispense Refill  ? acetaminophen (TYLENOL) 500 MG tablet Take 1,000 mg by mouth as needed. And take two by mouth twice daily at 6 am  and 4:30 pm for pain    ? bisacodyl (DULCOLAX) 10 MG suppository Place 10 mg rectally as needed for moderate constipation. If not relieved by MOM    ? brimonidine (ALPHAGAN P) 0.1 % SOLN Place 1 drop into b

## 2021-07-10 ENCOUNTER — Ambulatory Visit: Payer: Medicare HMO | Admitting: Interventional Cardiology

## 2021-07-10 ENCOUNTER — Ambulatory Visit (INDEPENDENT_AMBULATORY_CARE_PROVIDER_SITE_OTHER): Payer: Medicare HMO | Admitting: Interventional Cardiology

## 2021-07-10 ENCOUNTER — Encounter: Payer: Self-pay | Admitting: Interventional Cardiology

## 2021-07-10 VITALS — BP 114/72 | HR 78 | Wt 181.0 lb

## 2021-07-10 DIAGNOSIS — I5022 Chronic systolic (congestive) heart failure: Secondary | ICD-10-CM

## 2021-07-10 DIAGNOSIS — H524 Presbyopia: Secondary | ICD-10-CM | POA: Diagnosis not present

## 2021-07-10 DIAGNOSIS — Z950 Presence of cardiac pacemaker: Secondary | ICD-10-CM

## 2021-07-10 DIAGNOSIS — I25118 Atherosclerotic heart disease of native coronary artery with other forms of angina pectoris: Secondary | ICD-10-CM | POA: Diagnosis not present

## 2021-07-10 DIAGNOSIS — Z961 Presence of intraocular lens: Secondary | ICD-10-CM | POA: Diagnosis not present

## 2021-07-10 DIAGNOSIS — I10 Essential (primary) hypertension: Secondary | ICD-10-CM

## 2021-07-10 DIAGNOSIS — I4821 Permanent atrial fibrillation: Secondary | ICD-10-CM | POA: Diagnosis not present

## 2021-07-10 MED ORDER — CYCLOBENZAPRINE HCL 5 MG PO TABS
2.5000 mg | ORAL_TABLET | Freq: Every evening | ORAL | 0 refills | Status: DC | PRN
Start: 2021-07-10 — End: 2022-04-29

## 2021-07-10 NOTE — Patient Instructions (Signed)

## 2021-07-17 DIAGNOSIS — N35012 Post-traumatic membranous urethral stricture: Secondary | ICD-10-CM | POA: Diagnosis not present

## 2021-07-19 LAB — CUP PACEART REMOTE DEVICE CHECK
Battery Remaining Longevity: 25 mo
Battery Remaining Percentage: 26 %
Battery Voltage: 2.92 V
Date Time Interrogation Session: 20230511095314
Implantable Lead Implant Date: 20161205
Implantable Lead Implant Date: 20161205
Implantable Lead Implant Date: 20161205
Implantable Lead Location: 753858
Implantable Lead Location: 753859
Implantable Lead Location: 753860
Implantable Pulse Generator Implant Date: 20161205
Lead Channel Impedance Value: 450 Ohm
Lead Channel Impedance Value: 960 Ohm
Lead Channel Pacing Threshold Amplitude: 0.75 V
Lead Channel Pacing Threshold Amplitude: 1 V
Lead Channel Pacing Threshold Pulse Width: 0.5 ms
Lead Channel Pacing Threshold Pulse Width: 0.8 ms
Lead Channel Sensing Intrinsic Amplitude: 12 mV
Lead Channel Setting Pacing Amplitude: 1.75 V
Lead Channel Setting Pacing Amplitude: 2.5 V
Lead Channel Setting Pacing Pulse Width: 0.5 ms
Lead Channel Setting Pacing Pulse Width: 0.8 ms
Lead Channel Setting Sensing Sensitivity: 2 mV
Pulse Gen Model: 3262
Pulse Gen Serial Number: 7802901

## 2021-07-28 ENCOUNTER — Telehealth: Payer: Self-pay | Admitting: Internal Medicine

## 2021-07-28 DIAGNOSIS — Z0000 Encounter for general adult medical examination without abnormal findings: Secondary | ICD-10-CM | POA: Diagnosis not present

## 2021-07-28 NOTE — Telephone Encounter (Signed)
Patient requests a call from Margarita Grizzle about his transmission results.

## 2021-07-28 NOTE — Telephone Encounter (Signed)
Remote ICM transmission received.  Attempted call to patient regarding ICM remote transmission and left detailed message per DPR.  Advised to return call for any fluid symptoms or questions.   07/28/2021 Corvue thoracic impedance suggesting normal fluid levels.

## 2021-07-28 NOTE — Telephone Encounter (Signed)
Attempted call back to patient and person answering phone stated he is at doctor visit and advised to call back.

## 2021-07-28 NOTE — Telephone Encounter (Addendum)
Spoke with patient.  Discussed remote transmission and advised fluid levels are normal.  Explained to patient how reports can be sent automatically and reports are not sent by his cell phone.  He confirmed he has a box in his bedroom but not on the nightstand by the bed.  Advised to move monitor to nightstand where he sleeps so reports can be sent automatically.  He agreed to move the monitor.  Advised will schedule next remote transmission for 7/3 to check if report comes automatically.  If report received will re-enroll patient in monthly ICM follow up.

## 2021-08-09 DIAGNOSIS — R194 Change in bowel habit: Secondary | ICD-10-CM | POA: Diagnosis not present

## 2021-08-09 DIAGNOSIS — R630 Anorexia: Secondary | ICD-10-CM | POA: Diagnosis not present

## 2021-08-12 ENCOUNTER — Other Ambulatory Visit: Payer: Self-pay | Admitting: Adult Health

## 2021-08-12 DIAGNOSIS — I5042 Chronic combined systolic (congestive) and diastolic (congestive) heart failure: Secondary | ICD-10-CM

## 2021-08-21 ENCOUNTER — Other Ambulatory Visit: Payer: Self-pay | Admitting: Adult Health

## 2021-08-21 DIAGNOSIS — I2510 Atherosclerotic heart disease of native coronary artery without angina pectoris: Secondary | ICD-10-CM

## 2021-08-24 ENCOUNTER — Encounter (INDEPENDENT_AMBULATORY_CARE_PROVIDER_SITE_OTHER): Payer: Medicare HMO | Admitting: Ophthalmology

## 2021-08-24 DIAGNOSIS — H34832 Tributary (branch) retinal vein occlusion, left eye, with macular edema: Secondary | ICD-10-CM

## 2021-08-24 DIAGNOSIS — H353221 Exudative age-related macular degeneration, left eye, with active choroidal neovascularization: Secondary | ICD-10-CM | POA: Diagnosis not present

## 2021-08-24 DIAGNOSIS — H353112 Nonexudative age-related macular degeneration, right eye, intermediate dry stage: Secondary | ICD-10-CM | POA: Diagnosis not present

## 2021-08-24 DIAGNOSIS — I10 Essential (primary) hypertension: Secondary | ICD-10-CM

## 2021-08-24 DIAGNOSIS — H35033 Hypertensive retinopathy, bilateral: Secondary | ICD-10-CM | POA: Diagnosis not present

## 2021-08-24 DIAGNOSIS — H43811 Vitreous degeneration, right eye: Secondary | ICD-10-CM

## 2021-08-28 ENCOUNTER — Ambulatory Visit (INDEPENDENT_AMBULATORY_CARE_PROVIDER_SITE_OTHER): Payer: Medicare HMO

## 2021-08-28 DIAGNOSIS — Z950 Presence of cardiac pacemaker: Secondary | ICD-10-CM | POA: Diagnosis not present

## 2021-08-28 DIAGNOSIS — I5022 Chronic systolic (congestive) heart failure: Secondary | ICD-10-CM

## 2021-08-30 ENCOUNTER — Telehealth: Payer: Self-pay

## 2021-08-30 NOTE — Progress Notes (Signed)
EPIC Encounter for ICM Monitoring  Patient Name: [name] is a 88 y.o. male Date: 08/30/2021 Primary Care Physican: Lawerance Cruel, MD Primary Cardiologist: Irish Lack Electrophysiologist: Lovena Le  07/10/2021 Office Weight: 181 lbs     Spoke with patient and heart failure questions reviewed.  Pt asymptomatic for fluid accumulation.  Reports feeling well at this time and voices no complaints.    CorVue thoracic impedance suggesting possible fluid accumulation starting 7/1.   Prescribed:  Torsemide 20 mg Take 20 mg by mouth daily.   Torsemide 20 mg Take 20 mg by mouth as needed. Weight gain of 3lbs or more a day   Labs: 01/23/2021 Creatinine 1.10, BUN 15, Potassium 4.0, Sodium 136 A complete set of results can be found in Results Review.   Recommendations:  Advised to take 1 extra Torsemide tablet x 1 day and then return to prescribed dosage.   Follow-up plan: ICM clinic phone appointment on 09/11/2021 to recheck fluid levels.  91 day device clinic remote transmission 10/04/2021.     EP/Cardiology Office Visits:   Recall 07/05/2022 with Dr. Irish Lack. Recall 05/02/2020 with [name] (last visit 04/30/2019).     Copy of ICM check sent to Dr. Lovena Le.     3 month ICM trend: 08/28/2021.    12-14 Month ICM trend:     Rosalene Billings, RN 08/30/2021 10:28 AM

## 2021-08-30 NOTE — Telephone Encounter (Signed)
Remote ICM transmission received.  Attempted call to patient regarding ICM remote transmission and left detailed message per DPR.  Advised to return call for any fluid symptoms or questions.  

## 2021-09-01 ENCOUNTER — Ambulatory Visit (INDEPENDENT_AMBULATORY_CARE_PROVIDER_SITE_OTHER): Payer: Medicare HMO

## 2021-09-01 DIAGNOSIS — I5022 Chronic systolic (congestive) heart failure: Secondary | ICD-10-CM

## 2021-09-01 DIAGNOSIS — Z950 Presence of cardiac pacemaker: Secondary | ICD-10-CM

## 2021-09-01 NOTE — Progress Notes (Signed)
EPIC Encounter for ICM Monitoring  Patient Name: [name] is a 88 y.o. male Date: 09/01/2021 Primary Care Physican: Lawerance Cruel, MD Primary Cardiologist: Irish Lack Electrophysiologist: Lovena Le  07/10/2021 Office Weight: 181 lbs     Spoke with patient and heart failure questions reviewed.  Pt asymptomatic for fluid accumulation.     CorVue thoracic impedance suggesting fluid levels returned to normal after taking extra Torsemide.   Prescribed:  Torsemide 20 mg Take 20 mg by mouth daily.   Torsemide 20 mg Take 20 mg by mouth as needed. Weight gain of 3 lbs or more a day   Labs: 01/23/2021 Creatinine 1.10, BUN 15, Potassium 4.0, Sodium 136 A complete set of results can be found in Results Review.   Recommendations:  No changes and encouraged to call if experiencing any fluid symptoms.    Follow-up plan: ICM clinic phone appointment on 10/02/2021.  91 day device clinic remote transmission 10/04/2021.     EP/Cardiology Office Visits:   Recall 07/05/2022 with Dr. Irish Lack. Recall 05/02/2020 with [name] (last visit 04/30/2019).  Message sent to EP scheduler 7/7 to contact patient for overdue EP appointment.    Copy of ICM check sent to Dr. Lovena Le.     3 month ICM trend: 09/01/2021.    12-14 Month ICM trend:     Rosalene Billings, RN 09/01/2021 11:01 AM

## 2021-09-11 DIAGNOSIS — Z8603 Personal history of neoplasm of uncertain behavior: Secondary | ICD-10-CM | POA: Diagnosis not present

## 2021-09-11 DIAGNOSIS — Z9889 Other specified postprocedural states: Secondary | ICD-10-CM | POA: Diagnosis not present

## 2021-09-11 DIAGNOSIS — Z950 Presence of cardiac pacemaker: Secondary | ICD-10-CM | POA: Diagnosis not present

## 2021-09-11 DIAGNOSIS — D320 Benign neoplasm of cerebral meninges: Secondary | ICD-10-CM | POA: Diagnosis not present

## 2021-09-11 DIAGNOSIS — H532 Diplopia: Secondary | ICD-10-CM | POA: Diagnosis not present

## 2021-09-11 DIAGNOSIS — Z923 Personal history of irradiation: Secondary | ICD-10-CM | POA: Diagnosis not present

## 2021-09-15 ENCOUNTER — Other Ambulatory Visit: Payer: Self-pay | Admitting: Adult Health

## 2021-09-15 DIAGNOSIS — I5042 Chronic combined systolic (congestive) and diastolic (congestive) heart failure: Secondary | ICD-10-CM

## 2021-09-20 DIAGNOSIS — H353223 Exudative age-related macular degeneration, left eye, with inactive scar: Secondary | ICD-10-CM | POA: Diagnosis not present

## 2021-09-20 DIAGNOSIS — H4921 Sixth [abducent] nerve palsy, right eye: Secondary | ICD-10-CM | POA: Diagnosis not present

## 2021-09-20 DIAGNOSIS — H532 Diplopia: Secondary | ICD-10-CM | POA: Diagnosis not present

## 2021-09-26 ENCOUNTER — Ambulatory Visit: Payer: Medicare HMO | Admitting: Interventional Cardiology

## 2021-10-01 ENCOUNTER — Other Ambulatory Visit: Payer: Self-pay | Admitting: Adult Health

## 2021-10-01 DIAGNOSIS — I5042 Chronic combined systolic (congestive) and diastolic (congestive) heart failure: Secondary | ICD-10-CM

## 2021-10-02 ENCOUNTER — Ambulatory Visit (INDEPENDENT_AMBULATORY_CARE_PROVIDER_SITE_OTHER): Payer: Medicare HMO

## 2021-10-02 DIAGNOSIS — I5022 Chronic systolic (congestive) heart failure: Secondary | ICD-10-CM

## 2021-10-02 DIAGNOSIS — Z950 Presence of cardiac pacemaker: Secondary | ICD-10-CM | POA: Diagnosis not present

## 2021-10-03 ENCOUNTER — Telehealth: Payer: Self-pay

## 2021-10-03 NOTE — Progress Notes (Signed)
Spoke with patient and heart failure questions reviewed.  Pt asymptomatic for fluid accumulation.  Reports feeling well at this time and voices no complaints. No changes and encouraged to call if experiencing any fluid symptoms.

## 2021-10-03 NOTE — Telephone Encounter (Signed)
Remote ICM transmission received.  Attempted call to patient regarding ICM remote transmission and left detailed message per DPR.  Advised to return call for any fluid symptoms or questions. Next ICM remote transmission scheduled 11/06/2021.    

## 2021-10-03 NOTE — Progress Notes (Signed)
EPIC Encounter for ICM Monitoring  Patient Name: SERGI GELLNER is a 88 y.o. male Date: 10/03/2021 Primary Care Physican: Lawerance Cruel, MD Primary Cardiologist: Irish Lack Electrophysiologist: Lovena Le  07/10/2021 Office Weight: 181 lbs     Attempted call to patient and unable to reach.  Left detailed message per DPR regarding transmission. Transmission reviewed.    CorVue thoracic impedance suggesting fluid levels returned to normal after taking extra Torsemide.   Prescribed:  Torsemide 20 mg Take 20 mg by mouth daily.   Torsemide 20 mg Take 20 mg by mouth as needed. Weight gain of 3 lbs or more a day   Labs: 01/23/2021 Creatinine 1.10, BUN 15, Potassium 4.0, Sodium 136 A complete set of results can be found in Results Review.   Recommendations:  Left voice mail with ICM number and encouraged to call if experiencing any fluid symptoms.   Follow-up plan: ICM clinic phone appointment on 11/06/2021.  91 day device clinic remote transmission 01/03/2022.     EP/Cardiology Office Visits:   Recall 07/05/2022 with Dr. Irish Lack. 10/17/2021 with [name].    Copy of ICM check sent to Dr. Lovena Le.     3 month ICM trend: 10/02/2021.    12-14 Month ICM trend:     Rosalene Billings, RN 10/03/2021 3:43 PM

## 2021-10-04 ENCOUNTER — Ambulatory Visit (INDEPENDENT_AMBULATORY_CARE_PROVIDER_SITE_OTHER): Payer: Medicare HMO

## 2021-10-04 DIAGNOSIS — I442 Atrioventricular block, complete: Secondary | ICD-10-CM | POA: Diagnosis not present

## 2021-10-04 LAB — CUP PACEART REMOTE DEVICE CHECK
Battery Remaining Longevity: 23 mo
Battery Remaining Percentage: 23 %
Battery Voltage: 2.92 V
Date Time Interrogation Session: 20230809092536
Implantable Lead Implant Date: 20161205
Implantable Lead Implant Date: 20161205
Implantable Lead Implant Date: 20161205
Implantable Lead Location: 753858
Implantable Lead Location: 753859
Implantable Lead Location: 753860
Implantable Pulse Generator Implant Date: 20161205
Lead Channel Impedance Value: 480 Ohm
Lead Channel Impedance Value: 950 Ohm
Lead Channel Pacing Threshold Amplitude: 0.75 V
Lead Channel Pacing Threshold Amplitude: 0.75 V
Lead Channel Pacing Threshold Pulse Width: 0.5 ms
Lead Channel Pacing Threshold Pulse Width: 0.8 ms
Lead Channel Sensing Intrinsic Amplitude: 12 mV
Lead Channel Setting Pacing Amplitude: 1.75 V
Lead Channel Setting Pacing Amplitude: 2.5 V
Lead Channel Setting Pacing Pulse Width: 0.5 ms
Lead Channel Setting Pacing Pulse Width: 0.8 ms
Lead Channel Setting Sensing Sensitivity: 2 mV
Pulse Gen Model: 3262
Pulse Gen Serial Number: 7802901

## 2021-10-13 DIAGNOSIS — H353223 Exudative age-related macular degeneration, left eye, with inactive scar: Secondary | ICD-10-CM | POA: Diagnosis not present

## 2021-10-13 DIAGNOSIS — H532 Diplopia: Secondary | ICD-10-CM | POA: Diagnosis not present

## 2021-10-13 DIAGNOSIS — H4921 Sixth [abducent] nerve palsy, right eye: Secondary | ICD-10-CM | POA: Diagnosis not present

## 2021-10-17 ENCOUNTER — Encounter: Payer: Self-pay | Admitting: Internal Medicine

## 2021-10-17 ENCOUNTER — Ambulatory Visit: Payer: Medicare HMO | Admitting: Internal Medicine

## 2021-10-17 DIAGNOSIS — I480 Paroxysmal atrial fibrillation: Secondary | ICD-10-CM

## 2021-10-17 DIAGNOSIS — I5022 Chronic systolic (congestive) heart failure: Secondary | ICD-10-CM | POA: Diagnosis not present

## 2021-10-17 DIAGNOSIS — I5042 Chronic combined systolic (congestive) and diastolic (congestive) heart failure: Secondary | ICD-10-CM | POA: Diagnosis not present

## 2021-10-17 DIAGNOSIS — I5032 Chronic diastolic (congestive) heart failure: Secondary | ICD-10-CM | POA: Insufficient documentation

## 2021-10-17 DIAGNOSIS — Z950 Presence of cardiac pacemaker: Secondary | ICD-10-CM | POA: Diagnosis not present

## 2021-10-17 DIAGNOSIS — I2510 Atherosclerotic heart disease of native coronary artery without angina pectoris: Secondary | ICD-10-CM | POA: Diagnosis not present

## 2021-10-17 MED ORDER — TORSEMIDE 20 MG PO TABS: 20.0000 mg | ORAL_TABLET | ORAL | 2 refills | Status: DC | PRN

## 2021-10-17 MED ORDER — TORSEMIDE 20 MG PO TABS: 20.0000 mg | ORAL_TABLET | Freq: Every day | ORAL | 2 refills | Status: DC

## 2021-10-17 NOTE — Patient Instructions (Addendum)
Medication Instructions:  Your physician has recommended you make the following change in your medication:   YOUR TORSEMIDE AND AS NEED TORSEMIDE PRESCRIPTION REFILL HAS BEEN SENT TO YOUR PHARMACY.  Lab Work: None ordered.  If you have labs (blood work) drawn today and your tests are completely normal, you will receive your results only by: [place] (if you have MyChart) OR A paper copy in the mail If you have any lab test that is abnormal or we need to change your treatment, we will call you to review the results.  Testing/Procedures: None ordered.  Follow-Up:  IN 1 YEAR WITH DR. GREGG TAYLOR, AT [place].    Remote monitoring is used to monitor your Pacemaker/ ICD from home. This monitoring reduces the number of office visits required to check your device to one time per year. It allows [place] to keep an eye on the functioning of your device to ensure it is working properly. You are scheduled for a device check from home on 11/06/21. You may send your transmission at any time that day. If you have a wireless device, the transmission will be sent automatically. After your physician reviews your transmission, you will receive a postcard with your next transmission date.  Important Information About [place]

## 2021-10-17 NOTE — Progress Notes (Signed)
HPI [name] returns today for followup. He is a pleasant elderly man with symptomatic bradycardia, chronic systolic heart failure with near normalization of his LV function, lung disease, HTN, and is s/p Biv PPM insertion. In the interim, he has been stable.  He denies chest pain. His chf symptoms are class 2. No syncope. He has become a bit more sedentary.  Allergies  Allergen Reactions   Gabapentin     Other reaction(s): mood changes   Parecoxib Sodium [Parecoxib]    Tramadol Other (See Comments)    confusion   Codeine Anxiety and Other (See Comments)    Insomnia/ hyper   Omeprazole Nausea And Vomiting and Other (See Comments)    GI upset, insomnia   Warfarin Sodium Anxiety and Other (See Comments)    Hx GI bleed     Current Outpatient Medications  Medication Sig Dispense Refill   acetaminophen (TYLENOL) 500 MG tablet Take 1,000 mg by mouth as needed. And take two by mouth twice daily at 6 am and 4:30 pm for pain     bisacodyl (DULCOLAX) 10 MG suppository Place 10 mg rectally as needed for moderate constipation. If not relieved by MOM     brimonidine (ALPHAGAN P) 0.1 % SOLN Place 1 drop into both eyes 2 (two) times daily.      clopidogrel (PLAVIX) 75 MG tablet Take 1 tablet (75 mg total) by mouth daily. 30 tablet 0   cyclobenzaprine (FLEXERIL) 5 MG tablet Take 0.5 tablets (2.5 mg total) by mouth at bedtime as needed. 15 tablet 0   dorzolamide-timolol (COSOPT) 22.3-6.8 MG/ML ophthalmic solution Place 1 drop into both eyes 2 (two) times daily.   2   nitroGLYCERIN (NITROSTAT) 0.4 MG SL tablet Place 1 tablet (0.4 mg total) under the tongue every 5 (five) minutes as needed for chest pain. 20 tablet 0   Sodium Phosphates (RA SALINE ENEMA RE) Place 1 Bottle rectally daily as needed. If not relieved by Biscodyl suppository or Milk of Magnesia, Contact Dr. If no relief is given after Enema     tamsulosin (FLOMAX) 0.4 MG CAPS capsule Take 1 capsule (0.4 mg total) by mouth daily. 30  capsule 0   esomeprazole (NEXIUM) 40 MG capsule Take 1 capsule (40 mg total) by mouth 2 (two) times daily. (Patient not taking: Reported on 10/17/2021) 60 capsule 0   famotidine (PEPCID) 20 MG tablet TAKE 1 TABLET BY MOUTH TWICE DAILY AT BEDTIME AS NEEDED (Patient not taking: Reported on 10/17/2021)     magnesium hydroxide (MILK OF MAGNESIA) 400 MG/5ML suspension Take 30 mLs by mouth daily as needed for mild constipation. If no BM in 3 days (Patient not taking: Reported on 07/10/2021)     MAGNESIUM PO Take by mouth. Duffy Bruce (Patient not taking: Reported on 10/17/2021)     METAMUCIL FIBER PO Take 1 Scoop by mouth daily. (Patient not taking: Reported on 10/17/2021)     Nutritional Supplements ([place]) LIQD Take 1 Can by mouth in the morning and at bedtime. (Patient not taking: Reported on 10/17/2021)     ondansetron (ZOFRAN) 4 MG tablet Take 4 mg by mouth every 8 (eight) hours as needed for nausea or vomiting. (Patient not taking: Reported on 07/10/2021)     oxycodone (OXY-IR) 5 MG capsule Take 1 capsule (5 mg total) by mouth every 12 (twelve) hours as needed. (Patient not taking: Reported on 10/17/2021) 14 capsule 0   pantoprazole (PROTONIX) 40 MG tablet Take 1 tablet (  40 mg total) by mouth daily. (Patient not taking: Reported on 07/10/2021) 30 tablet 0   polyethylene glycol (MIRALAX / GLYCOLAX) 17 g packet Take 17 g by mouth daily as needed. (Patient not taking: Reported on 10/17/2021) 14 each 0   Probiotic Product (ALIGN) 4 MG CAPS Take 1 capsule by mouth daily. (Patient not taking: Reported on 07/10/2021)     Probiotic Product (PROBIOTIC PO) Take by mouth. Daily (Patient not taking: Reported on 10/17/2021)     torsemide (DEMADEX) 20 MG tablet Take 1 tablet (20 mg total) by mouth daily. See as needed listing also 30 tablet 2   torsemide (DEMADEX) 20 MG tablet Take 1 tablet (20 mg total) by mouth as needed. Weight gain of 3lbs or more a day. See scheduled listing also 30 tablet 2   No current  facility-administered medications for this visit.     Past Medical History:  Diagnosis Date   Anxiety    Arthritis    Atrial fibrillation, permanent ([place]) 01/07/2016   Blood transfusion    "related to a surgery"   BPH (benign prostatic hypertrophy) with urinary obstruction    s/p turp yrs ago   Bradycardia    a. Amio d/c'd 08/2013; brady arrest 08/2013 after PCI >>> recurrent AF >>> Amiodarone restarted. b. Pacemaker being considered in 11/2014.   CAD (coronary artery disease)    a. s/p MI and prior PCI of LAD;  b. LHC (08/2013):  prox LAD 60-70%, mid LAD stents ok, ostial lesion at Dx jailed by stent, mild CFX and RCA disesase >>>  PCI (09/08/13):  rotational atherectomy + Promus DES to prox LAD   Cardiomyopathy [facility])    a. Echo (08/2013):  EF 30-35%, AS hypokinesis, Gr 1 diast dysfn, mild MR, mild LAE >>> b. improved EF 50-55% by echo 8/15. c. EF down again by echo 12/2014 to 30-35% but 51% by nuc.   Chronic lower back pain    Chronic systolic CHF (congestive heart failure) (HCC)    CKD (chronic kidney disease), stage III ([place])    a. Per review of labs baseline Cr 1.1-1.3.   DDD (degenerative disc disease)    chronic back pain   Depression    Diverticulitis of colon with bleeding    s/p sigmoid resection '88   DJD (degenerative joint disease) hips and knees   s/p bilateral total replacements   GERD (gastroesophageal reflux disease)    occ. take prevacid   Glaucoma    Hearing impaired person, right    History of GI diverticular bleed april 2012   transfused blood and resolved without surgical intervention   Hypertension    Impaired hearing bilateral    only left hearing aid   Impotence, organic    s/p penile prosthesis 1990's   Incomplete bladder emptying    LBBB (left bundle branch block)    Meningioma (HCC) right -sided w/ right VI palsy   followed by dr Gaynell Face   Mitral regurgitation    a. Mild-mod by echo 12/2014.   Myocardial infarction ([place]) 1980's- medical  intervention   "so mild I didn't know I'd had it"   PAF (paroxysmal atrial fibrillation) ([place])    not on coumadin due to hx of GI bleed.   Prostate cancer ([place]) 11/30/13   Gleason 8, volume 22.14 cc   Sleep apnea    non-compliant cpap   Uses hearing aid    left    Vitreous hemorrhage (HCC)    related to branch retinal vein  occlusion left eye   Wears glasses     ROS:   All systems reviewed and negative except as noted in the HPI.   Past Surgical History:  Procedure Laterality Date   APPENDECTOMY     CARDIAC CATHETERIZATION  2007   noncritical cad (results w/ chart)   CARDIOVERSION N/A 10/13/2015   Procedure: CARDIOVERSION;  Surgeon: Josue Hector, MD;  Location: [facility];  Service: Cardiovascular;  Laterality: N/A;   CATARACT EXTRACTION W/ INTRAOCULAR LENS  IMPLANT, BILATERAL Bilateral ~ [place] Right "several"   CORONARY ANGIOPLASTY WITH STENT PLACEMENT  08-03-08   drug-eluting stent x2 distal and mid lad   EP IMPLANTABLE DEVICE N/A 01/31/2015   Procedure: BiV Pacemaker Insertion CRT-P;  Surgeon: Evans Lance, MD;  Location: [place] CV LAB;  Service: Cardiovascular;  Laterality: N/A;   EP IMPLANTABLE DEVICE N/A 02/07/2015   Procedure: Lead Revision/Repair;  Surgeon: Deboraha Sprang, MD;  Location: [place] CV LAB;  Service: Cardiovascular;  Laterality: N/A;   ESOPHAGOGASTRODUODENOSCOPY N/A 02/11/2014   Procedure: ESOPHAGOGASTRODUODENOSCOPY (EGD);  Surgeon: Cleotis Nipper, MD;  Location: [facility] ENDOSCOPY;  Service: Endoscopy;  Laterality: N/A;   gamma knife radiation  2000   [facility] for meningioma, last eval 2013- no change   GAS INSERTION Left 07/14/2019   Procedure: INSERTION OF GAS;  Surgeon: Hayden Pedro, MD;  Location: [place] [facility];  Service: Ophthalmology;  Laterality: Left;   INGUINAL HERNIA REPAIR Bilateral    INNER EAR SURGERY Right yrs ago   "trying to get my hearing back   LEFT HEART CATHETERIZATION WITH  CORONARY ANGIOGRAM N/A 09/04/2013   Procedure: LEFT HEART CATHETERIZATION WITH CORONARY ANGIOGRAM;  Surgeon: Jettie Booze, MD;  Location: [facility] CATH LAB;  Service: Cardiovascular;  Laterality: N/A;   PARS PLANA VITRECTOMY Left 07/14/2019   27 GAUGE   PARS PLANA VITRECTOMY 27 GAUGE Left 07/14/2019   Procedure: PARS PLANA VITRECTOMY 27 GAUGE;  Surgeon: Hayden Pedro, MD;  Location: [facility];  Service: Ophthalmology;  Laterality: Left;   PENILE PROSTHESIS IMPLANT  1990's   PERCUTANEOUS CORONARY ROTOBLATOR INTERVENTION (PCI-R) N/A 09/08/2013   Procedure: PERCUTANEOUS CORONARY ROTOBLATOR INTERVENTION (PCI-R);  Surgeon: Jettie Booze, MD;  Location: [facility] CATH LAB;  Service: Cardiovascular;  Laterality: N/A;   PHOTOCOAGULATION WITH LASER Left 07/14/2019   Procedure: PHOTOCOAGULATION WITH LASER;  Surgeon: Hayden Pedro, MD;  Location: [place];  Service: Ophthalmology;  Laterality: Left;   PROSTATE BIOPSY  11/30/13   Gleason 8, vol 22.14 cc   REVISION TOTAL KNEE ARTHROPLASTY Right    SHOULDER OPEN ROTATOR CUFF REPAIR Left    TOTAL HIP ARTHROPLASTY Right 03-25-08--   TOTAL HIP ARTHROPLASTY Left 2005   TOTAL HIP REVISION Right 3-4 times   TOTAL KNEE ARTHROPLASTY Right 2004   TOTAL KNEE ARTHROPLASTY Left 1997   TRANSURETHRAL RESECTION OF PROSTATE  "years ago"   TRANSURETHRAL RESECTION OF PROSTATE  01/08/2011   Procedure: TRANSURETHRAL RESECTION OF THE PROSTATE (TURP);  Surgeon: Franchot Gallo;  Location: [address];  Service: Urology;  Laterality: N/A;  GYRUS      Family History  Problem Relation Age of Onset   Heart disease Mother    Heart attack Mother    Emphysema Father    Cancer Brother        liver cancer   Cancer Other      Social History   Socioeconomic History   Marital status:  Widowed    Spouse name: Not on file   Number of children: Not on file   Years of education: Not on file   Highest education level: Not on file  Occupational History   Not on  file  Tobacco Use   Smoking status: Former    Packs/day: 1.00    Years: 14.00    Total pack years: 14.00    Types: Cigarettes    Quit date: 01/05/1959    Years since quitting: 62.8   Smokeless tobacco: Never  Vaping Use   Vaping Use: Never used  Substance and Sexual Activity   Alcohol use: Yes    Comment: social    Drug use: No   Sexual activity: Not Currently  Other Topics Concern   Not on file  Social History Narrative   Not on file   Social Determinants of Health   Financial Resource Strain: Not on file  Food Insecurity: Not on file  Transportation Needs: Not on file  Physical Activity: Not on file  Stress: Not on file  Social Connections: Not on file  Intimate Partner Violence: Not on file     BP 112/70   Pulse 76   Ht '5\' 8"'$  (1.727 m)   Wt 184 lb 9.6 oz (83.7 kg)   SpO2 96%   BMI 28.07 kg/m   Physical Exam:  Well appearing NAD HEENT: Unremarkable Neck:  No JVD, no thyromegally Lymphatics:  No adenopathy Back:  No CVA tenderness Lungs:  Clear HEART:  Regular rate rhythm, no murmurs, no rubs, no clicks Abd:  soft, positive bowel sounds, no organomegally, no rebound, no guarding Ext:  2 plus pulses, no edema, no cyanosis, no clubbing Skin:  No rashes no nodules Neuro:  CN II through XII intact, motor grossly intact  EKG  DEVICE  Normal device function.  See PaceArt for details.   Assess/Plan:   1. Acute on chronic systolic/diastolic heart failure - his symptoms remain class 2. He will continue his medical therapy. His [place] is up a bit. 2. Atrial fib/flutter- he is well controlled. Her Biv pacing 85% of the time.  3. PPM - his [employer] biv ppm is working normally. We will recheck in several months. He is programmed VVIR 4. HTN - his bp is well controlled. He usually runs a little lower.    Mikle Bosworth.D.

## 2021-10-24 DIAGNOSIS — C44519 Basal cell carcinoma of skin of other part of trunk: Secondary | ICD-10-CM | POA: Diagnosis not present

## 2021-10-24 DIAGNOSIS — L820 Inflamed seborrheic keratosis: Secondary | ICD-10-CM | POA: Diagnosis not present

## 2021-10-24 DIAGNOSIS — Z85828 Personal history of other malignant neoplasm of skin: Secondary | ICD-10-CM | POA: Diagnosis not present

## 2021-10-24 DIAGNOSIS — L244 Irritant contact dermatitis due to drugs in contact with skin: Secondary | ICD-10-CM | POA: Diagnosis not present

## 2021-10-24 DIAGNOSIS — L570 Actinic keratosis: Secondary | ICD-10-CM | POA: Diagnosis not present

## 2021-10-30 ENCOUNTER — Other Ambulatory Visit: Payer: Self-pay

## 2021-10-30 ENCOUNTER — Emergency Department (HOSPITAL_BASED_OUTPATIENT_CLINIC_OR_DEPARTMENT_OTHER): Payer: Medicare HMO

## 2021-10-30 ENCOUNTER — Emergency Department (HOSPITAL_BASED_OUTPATIENT_CLINIC_OR_DEPARTMENT_OTHER)
Admission: EM | Admit: 2021-10-30 | Discharge: 2021-10-30 | Disposition: A | Discharge status: Home / Self Care | Payer: Medicare HMO | Attending: Emergency Medicine | Admitting: Emergency Medicine

## 2021-10-30 DIAGNOSIS — R519 Headache, unspecified: Secondary | ICD-10-CM | POA: Insufficient documentation

## 2021-10-30 DIAGNOSIS — Z7902 Long term (current) use of antithrombotics/antiplatelets: Secondary | ICD-10-CM | POA: Diagnosis not present

## 2021-10-30 NOTE — Discharge Instructions (Addendum)
Please read and follow all provided instructions.  Your diagnoses today include:  1. Acute nonintractable headache, unspecified headache type     Tests performed today include: CT of your head which was stable from previous and stable from recent MRI and did not show any serious cause of your headache Vital signs. See below for your results today.   Medications:  None ordered  Use your home medications (Tylenol, Tramadol) as prescribed for pain.  If taking tramadol, he is at the lowest dose and make sure someone else is around.  Stronger pain medications can make you dizzy and can lead to falls.  Additional information:  Follow any educational materials contained in this packet.  You are having a headache. No specific cause was found today for your headache. It may have been a migraine or other cause of headache. Stress, anxiety, fatigue, and depression are common triggers for headaches.   Your headache today does not appear to be life-threatening or require hospitalization, but often the exact cause of headaches is not determined in the emergency department. Therefore, follow-up with your doctor is very important to find out what may have caused your headache and whether or not you need any further diagnostic testing or treatment.   Sometimes headaches can appear benign (not harmful), but then more serious symptoms can develop which should prompt an immediate re-evaluation by your doctor or the emergency department.  BE VERY CAREFUL not to take multiple medicines containing Tylenol (also called acetaminophen). Doing so can lead to an overdose which can damage your liver and cause liver failure and possibly death.   Follow-up instructions: Please follow-up with your primary care provider in the next 7 days for further evaluation of your symptoms.   Return instructions:  Please return to the Emergency Department if you experience worsening symptoms. Return if the medications do not  resolve your headache, if it recurs, or if you have multiple episodes of vomiting or cannot keep down fluids. Return if you have a change from the usual headache. RETURN IMMEDIATELY IF you: Develop a sudden, severe headache Develop confusion or become poorly responsive or faint Develop a fever above 100.63F or problem breathing Have a change in speech, vision, swallowing, or understanding Develop new weakness, numbness, tingling, incoordination in your arms or legs Have a seizure Please return if you have any other emergent concerns.  Additional Information:  Your vital signs today were: BP (!) 140/97   Pulse 78   Temp 98 F (36.7 C)   Resp 18   Ht '5\' 8"'$  (1.727 m)   Wt 81.6 kg   SpO2 97%   BMI 27.37 kg/m  If your blood pressure (BP) was elevated above 135/85 this visit, please have this repeated by your doctor within one month. --------------

## 2021-10-30 NOTE — ED Provider Notes (Signed)
[facility] EMERGENCY DEPT Provider Note   CSN: [id number] Arrival date & time: 10/30/21  1251     History  Chief Complaint  Patient presents with   Head Injury    Head Pain    [name] is a 88 y.o. male.  Patient with history of stable subdural hygroma, chronic back pain on tramadol and Tylenol --presents to the emergency department for evaluation of posterior scalp pain.  Symptoms started acutely this morning after using a cream which he puts on his scalp for areas of skin cancer.  He denies new falls or injuries.  States that he did have a fall several months ago, but no recently.  He had a follow-up MRI in July at [facility], he brings a CD with him today with the images, and I reviewed results in care everywhere as below.  Patient states that at home he took Tylenol for the pain and placed an ice pack on his head which resolved his pain, however it returned prompting emergency department visit.  Presents today with daughter-in-law.  No associated fevers, confusion, vomiting.  No neck pain.  No weakness, numbness, or tingling in the arms of the legs.  He walks with walker at baseline.  Patient states that he has double vision, but this is a long-term chronic issue for him.  MRI 08/2021: Brain: No significant interval change in the partially calcified dural based enhancing lesion along the right eccentric clivus with extension into the right cavernous sinus with likely at least partial encasement of the right cavernous carotid artery. No parenchymal restricted diffusion to suggest acute infarct. No significant mass effect. No evidence of acute hemorrhage. No hydrocephalus. Scattered foci of T2/FLAIR hyperintense signal abnormality in the periventricular and subcortical white matter, nonspecific, but favored to reflect sequelae of chronic microvascular ischemic disease (leukoaraiosis). Symmetric global cerebral and cerebellar volume loss with ex-vacuo dilatation of the  lateral ventricles. Similar remote microhemorrhage in the right parietal lobe. Normal appearance of the major intracranial arteries and dural venous sinuses.        Home Medications Prior to Admission medications   Medication Sig Start Date End Date Taking? Authorizing Provider  acetaminophen (TYLENOL) 500 MG tablet Take 1,000 mg by mouth as needed. And take two by mouth twice daily at 6 am and 4:30 pm for pain    [provider]  bisacodyl (DULCOLAX) 10 MG suppository Place 10 mg rectally as needed for moderate constipation. If not relieved by MOM    [provider]  brimonidine (ALPHAGAN P) 0.1 % SOLN Place 1 drop into both eyes 2 (two) times daily.  08/11/15   [provider]  clopidogrel (PLAVIX) 75 MG tablet Take 1 tablet (75 mg total) by mouth daily. 03/08/21   Medina-Vargas, Monina C, NP  cyclobenzaprine (FLEXERIL) 5 MG tablet Take 0.5 tablets (2.5 mg total) by mouth at bedtime as needed. 07/10/21   Jettie Booze, MD  dorzolamide-timolol (COSOPT) 22.3-6.8 MG/ML ophthalmic solution Place 1 drop into both eyes 2 (two) times daily.  11/12/13   [provider]  nitroGLYCERIN (NITROSTAT) 0.4 MG SL tablet Place 1 tablet (0.4 mg total) under the tongue every 5 (five) minutes as needed for chest pain. 03/08/21   Medina-Vargas, Monina C, NP  Sodium Phosphates (RA SALINE ENEMA RE) Place 1 Bottle rectally daily as needed. If not relieved by Biscodyl suppository or Milk of Magnesia, Contact Dr. If no relief is given after Enema    [provider]  tamsulosin (  FLOMAX) 0.4 MG CAPS capsule Take 1 capsule (0.4 mg total) by mouth daily. 03/08/21   Medina-Vargas, Monina C, NP  torsemide (DEMADEX) 20 MG tablet Take 1 tablet (20 mg total) by mouth daily. See as needed listing also 10/17/21   Evans Lance, MD  torsemide (DEMADEX) 20 MG tablet Take 1 tablet (20 mg total) by mouth as needed. Weight gain of 3lbs or more a day. See scheduled listing also 10/17/21    Evans Lance, MD      Allergies    Gabapentin, Parecoxib sodium [parecoxib], Tramadol, Codeine, Omeprazole, and Warfarin sodium    Review of Systems   Review of Systems  Physical Exam Updated Vital Signs BP (!) 140/97   Pulse 78   Temp 98 F (36.7 C)   Resp 18   Ht '5\' 8"'$  (1.727 m)   Wt 81.6 kg   SpO2 97%   BMI 27.37 kg/m  Physical Exam Vitals and nursing note reviewed.  Constitutional:      Appearance: He is well-developed.  HENT:     Head: Normocephalic and atraumatic.     Comments: Scalp with areas of erosion consistent with reported skin cancers --but no focal areas of swelling or redness which would be concerning for abscess or cellulitis.  No deformities.  No wounds, abrasions or lacerations.    Right Ear: Tympanic membrane, ear canal and external ear normal.     Left Ear: Tympanic membrane, ear canal and external ear normal.     Nose: Nose normal.     Mouth/Throat:     Pharynx: Uvula midline.  Eyes:     General: Lids are normal.     Conjunctiva/sclera: Conjunctivae normal.     Pupils: Pupils are equal, round, and reactive to light.  Cardiovascular:     Rate and Rhythm: Normal rate and regular rhythm.  Pulmonary:     Effort: Pulmonary effort is normal.     Breath sounds: Normal breath sounds.  Abdominal:     Palpations: Abdomen is soft.     Tenderness: There is no abdominal tenderness.  Musculoskeletal:        General: Normal range of motion.     Cervical back: Normal range of motion and neck supple. No tenderness or bony tenderness.  Skin:    General: Skin is warm and dry.  Neurological:     Mental Status: He is alert and oriented to person, place, and time.     GCS: GCS eye subscore is 4. GCS verbal subscore is 5. GCS motor subscore is 6.     Cranial Nerves: No cranial nerve deficit.     Sensory: No sensory deficit.     Motor: No abnormal muscle tone.     ED Results / Procedures / Treatments   Labs (all labs ordered are listed, but only abnormal  results are displayed) Labs Reviewed - No data to display  EKG None  Radiology CT Head Wo Contrast  Result Date: 10/30/2021 CLINICAL DATA:  Worsening headache.  Recent fall. EXAM: CT HEAD WITHOUT CONTRAST TECHNIQUE: Contiguous axial images were obtained from the base of the skull through the vertex without intravenous contrast. RADIATION DOSE REDUCTION: This exam was performed according to the departmental dose-optimization program which includes automated exposure control, adjustment of the mA and/or kV according to patient size and/or use of iterative reconstruction technique. COMPARISON:  01/23/2021 FINDINGS: Brain: No evidence of intracranial hemorrhage, acute infarction, or hydrocephalus. Old small left frontoparietal subdural hygroma remains stable, with stable  mild mass effect and left-to-right midline shift. Diffuse cerebral and cerebellar atrophy are stable. Mild chronic small vessel disease shows no significant change. Vascular:  No hyperdense vessel or other acute findings. Skull: No evidence of fracture or other significant bone abnormality. Sinuses/Orbits:  No acute findings. Other: None. IMPRESSION: No acute intracranial findings. Stable old small left frontoparietal subdural hygroma, with mild mass effect and left-to-right midline shift. Stable cerebral and cerebellar atrophy and chronic small vessel disease. Electronically Signed   By: Marlaine Hind M.D.   On: 10/30/2021 14:43    Procedures Procedures    Medications Ordered in ED Medications - No data to display  ED Course/ Medical Decision Making/ A&P    Patient seen and examined. History obtained directly from patient and daughter-in-law at bedside.  I reviewed external clinic notes.  Work-up including labs, imaging, EKG ordered in triage, if performed, were reviewed.    Labs/EKG: None ordered  Imaging: Independently reviewed and interpreted.  This included: CT head, agree chronic appearing left-sided subdural hygroma.  No  obvious problems of the posterior scalp where patient is having pain today.  Medications/Fluids: None ordered  Most recent vital signs reviewed and are as follows: BP (!) 140/97   Pulse 78   Temp 98 F (36.7 C)   Resp 18   Ht '5\' 8"'$  (1.727 m)   Wt 81.6 kg   SpO2 97%   BMI 27.37 kg/m   Initial impression: Posterior head and scalp pain, reassuring imaging, pain currently controlled  H&P, work-up, and plan reviewed with: Dr. Ronnald Nian  Home treatment plan: Continued home medications as well as ice to the scalp.  Patient was previously prescribed tramadol for the back pain.  He has tolerated this well in the past.  Urged to use under supervision.  Return instructions discussed with patient: Patient was counseled on symptoms that should indicate their return to the ED.  These include severe worsening headache, vision changes, confusion, loss of consciousness, trouble walking, nausea & vomiting, or weakness/tingling in extremities.    Follow-up instructions discussed with patient: PCP in next 1 week for recheck.                          Medical Decision Making Amount and/or Complexity of Data Reviewed Radiology: ordered.   In regards to the patient's headache, critical differentials were considered including subarachnoid hemorrhage, intracerebral hemorrhage, epidural/subdural hematoma, pituitary apoplexy, vertebral/carotid artery dissection, giant cell arteritis, central venous thrombosis, reversible cerebral vasoconstriction, acute angle closure glaucoma, idiopathic intracranial hypertension, bacterial meningitis, viral encephalitis, carbon monoxide poisoning, posterior reversible encephalopathy syndrome, pre-eclampsia.   Reg flag symptoms related to these causes were considered including systemic symptoms (fever, weight loss), neurologic symptoms (confusion, mental status change, vision change, associated seizure), acute or sudden "thunderclap" onset, patient age 75 or older with new or  progressive headache, patient of any age with first headache or change in headache pattern, pregnant or postpartum status, history of HIV or other immunocompromise, history of cancer, headache occurring with exertion, associated neck or shoulder pain, associated traumatic injury, concurrent use of anticoagulation, family history of spontaneous SAH, and concurrent drug use.    Other benign, more common causes of headache were considered including migraine, tension-type headache, cluster headache, referred pain from other cause such as sinus infection, dental pain, trigeminal neuralgia.   On exam, patient has a reassuring neuro exam including baseline mental status, no significant neck pain or meningeal signs, no signs of severe infection or  fever.  CT head appears to be stable.  The patient's vital signs, pertinent lab work and imaging were reviewed and interpreted as discussed in the ED course. Hospitalization was considered for further testing, treatments, or serial exams/observation. However as patient is well-appearing, has a stable exam over the course of their evaluation, and reassuring studies today, I do not feel that they warrant admission at this time. This plan was discussed with the patient who verbalizes agreement and comfort with this plan and seems reliable and able to return to the Emergency Department with worsening or changing symptoms.          Final Clinical Impression(s) / ED Diagnoses Final diagnoses:  Acute nonintractable headache, unspecified headache type    Rx / DC Orders ED Discharge Orders     None         Carlisle Cater, PA-C 10/30/21 [place], [place], DO 10/30/21 1610

## 2021-10-30 NOTE — ED Triage Notes (Addendum)
Patient arrives with complaints of worsening head/scalp pain. Patient fell and hit his head several months ago and he has also undergoing cancer screening for his scalp. He had an MRI done ~1 month ago, and he is unsure of the results. Patient is here for pain control.  Rates head/scalp pain a 5/10.   ----Patient has CD with MRI results with him.

## 2021-10-31 DIAGNOSIS — F332 Major depressive disorder, recurrent severe without psychotic features: Secondary | ICD-10-CM | POA: Diagnosis not present

## 2021-10-31 DIAGNOSIS — I10 Essential (primary) hypertension: Secondary | ICD-10-CM | POA: Diagnosis not present

## 2021-10-31 DIAGNOSIS — N183 Chronic kidney disease, stage 3 unspecified: Secondary | ICD-10-CM | POA: Diagnosis not present

## 2021-10-31 DIAGNOSIS — K219 Gastro-esophageal reflux disease without esophagitis: Secondary | ICD-10-CM | POA: Diagnosis not present

## 2021-10-31 DIAGNOSIS — N400 Enlarged prostate without lower urinary tract symptoms: Secondary | ICD-10-CM | POA: Diagnosis not present

## 2021-10-31 DIAGNOSIS — E7800 Pure hypercholesterolemia, unspecified: Secondary | ICD-10-CM | POA: Diagnosis not present

## 2021-11-06 ENCOUNTER — Ambulatory Visit (INDEPENDENT_AMBULATORY_CARE_PROVIDER_SITE_OTHER): Payer: Medicare HMO

## 2021-11-06 DIAGNOSIS — I5022 Chronic systolic (congestive) heart failure: Secondary | ICD-10-CM | POA: Diagnosis not present

## 2021-11-06 DIAGNOSIS — Z950 Presence of cardiac pacemaker: Secondary | ICD-10-CM | POA: Diagnosis not present

## 2021-11-07 NOTE — Progress Notes (Signed)
Remote pacemaker transmission.   

## 2021-11-08 NOTE — Progress Notes (Signed)
EPIC Encounter for ICM Monitoring  Patient Name: [name] is a 88 y.o. male Date: 11/08/2021 Primary Care Physican: Lawerance Cruel, MD Primary Cardiologist: Irish Lack Electrophysiologist: Lovena Le  BiV Pacing: 87% 07/10/2021 Office Weight: 181 lbs     Spoke with patient and heart failure questions reviewed.  Pt asymptomatic for fluid accumulation.  Reports feeling well at this time and voices no complaints.    CorVue thoracic impedance normal but was suggesting possible fluid accumulation from 8/15-8/24.     Prescribed:  Torsemide 20 mg Take 1 tablet (20 mg total) by mouth as needed. Weight gain of 3lbs or more a day.    Labs: 01/23/2021 Creatinine 1.10, BUN 15, Potassium 4.0, Sodium 136 A complete set of results can be found in Results Review.   Recommendations:  No changes and encouraged to call if experiencing any fluid symptoms.   Follow-up plan: ICM clinic phone appointment on 12/11/2021.  91 day device clinic remote transmission 01/03/2022.     EP/Cardiology Office Visits:   Recall 07/05/2022 with Dr. Irish Lack.  Recall [phone] with [name].    Copy of ICM check sent to Dr. Lovena Le.    3 month ICM trend: 11/06/2021.    12-14 Month ICM trend:     Rosalene Billings, RN 11/08/2021 9:28 AM

## 2021-11-09 ENCOUNTER — Encounter (INDEPENDENT_AMBULATORY_CARE_PROVIDER_SITE_OTHER): Payer: Medicare HMO | Admitting: Ophthalmology

## 2021-11-21 ENCOUNTER — Encounter (INDEPENDENT_AMBULATORY_CARE_PROVIDER_SITE_OTHER): Payer: Medicare HMO | Admitting: Ophthalmology

## 2021-11-21 DIAGNOSIS — H353112 Nonexudative age-related macular degeneration, right eye, intermediate dry stage: Secondary | ICD-10-CM | POA: Diagnosis not present

## 2021-11-21 DIAGNOSIS — H43811 Vitreous degeneration, right eye: Secondary | ICD-10-CM

## 2021-11-21 DIAGNOSIS — H35033 Hypertensive retinopathy, bilateral: Secondary | ICD-10-CM

## 2021-11-21 DIAGNOSIS — H348322 Tributary (branch) retinal vein occlusion, left eye, stable: Secondary | ICD-10-CM

## 2021-11-21 DIAGNOSIS — H353221 Exudative age-related macular degeneration, left eye, with active choroidal neovascularization: Secondary | ICD-10-CM | POA: Diagnosis not present

## 2021-11-21 DIAGNOSIS — I10 Essential (primary) hypertension: Secondary | ICD-10-CM

## 2021-11-22 DIAGNOSIS — M1990 Unspecified osteoarthritis, unspecified site: Secondary | ICD-10-CM | POA: Diagnosis not present

## 2021-11-22 DIAGNOSIS — I4891 Unspecified atrial fibrillation: Secondary | ICD-10-CM | POA: Diagnosis not present

## 2021-11-22 DIAGNOSIS — Z23 Encounter for immunization: Secondary | ICD-10-CM | POA: Diagnosis not present

## 2021-11-22 DIAGNOSIS — R531 Weakness: Secondary | ICD-10-CM | POA: Diagnosis not present

## 2021-11-22 DIAGNOSIS — R609 Edema, unspecified: Secondary | ICD-10-CM | POA: Diagnosis not present

## 2021-11-22 DIAGNOSIS — M961 Postlaminectomy syndrome, not elsewhere classified: Secondary | ICD-10-CM | POA: Diagnosis not present

## 2021-11-22 DIAGNOSIS — M549 Dorsalgia, unspecified: Secondary | ICD-10-CM | POA: Diagnosis not present

## 2021-12-11 ENCOUNTER — Ambulatory Visit (INDEPENDENT_AMBULATORY_CARE_PROVIDER_SITE_OTHER): Payer: Medicare HMO

## 2021-12-11 DIAGNOSIS — I5022 Chronic systolic (congestive) heart failure: Secondary | ICD-10-CM

## 2021-12-11 DIAGNOSIS — Z950 Presence of cardiac pacemaker: Secondary | ICD-10-CM | POA: Diagnosis not present

## 2021-12-13 ENCOUNTER — Other Ambulatory Visit: Payer: Self-pay | Admitting: Internal Medicine

## 2021-12-13 DIAGNOSIS — I5042 Chronic combined systolic (congestive) and diastolic (congestive) heart failure: Secondary | ICD-10-CM

## 2021-12-15 NOTE — Progress Notes (Signed)
EPIC Encounter for ICM Monitoring  Patient Name: [name] is a 88 y.o. male Date: 12/15/2021 Primary Care Physican: Lawerance Cruel, MD Primary Cardiologist: Irish Lack Electrophysiologist: Lovena Le  BiV Pacing: 89% 07/10/2021 Office Weight: 181 lbs 12/15/2021 Weight: 180 lbs     Spoke with patient and heart failure questions reviewed.  Pt asymptomatic for fluid accumulation.  Reports feeling well at this time and voices no complaints.    CorVue thoracic impedance normal but was suggesting possible fluid accumulation from 9/28-10/11.     Prescribed:  Torsemide 20 mg Take 1 tablet (20 mg total) by mouth as needed. Weight gain of 3lbs or more a day.    Labs: 01/23/2021 Creatinine 1.10, BUN 15, Potassium 4.0, Sodium 136 A complete set of results can be found in Results Review.   Recommendations:  No changes and encouraged to call if experiencing any fluid symptoms.   Follow-up plan: ICM clinic phone appointment on 01/15/2022.  91 day device clinic remote transmission 01/03/2022.     EP/Cardiology Office Visits:   Recall 07/05/2022 with Dr. Irish Lack.  Recall 10/15/2022 with [name].    Copy of ICM check sent to Dr. Lovena Le.     3 month ICM trend: 12/11/2021.    12-14 Month ICM trend:     Rosalene Billings, RN 12/15/2021 1:38 PM

## 2021-12-19 DIAGNOSIS — Z961 Presence of intraocular lens: Secondary | ICD-10-CM | POA: Diagnosis not present

## 2021-12-19 DIAGNOSIS — H401130 Primary open-angle glaucoma, bilateral, stage unspecified: Secondary | ICD-10-CM | POA: Diagnosis not present

## 2021-12-19 DIAGNOSIS — H4940 Progressive external ophthalmoplegia, unspecified eye: Secondary | ICD-10-CM | POA: Diagnosis not present

## 2021-12-19 DIAGNOSIS — H52223 Regular astigmatism, bilateral: Secondary | ICD-10-CM | POA: Diagnosis not present

## 2021-12-21 DIAGNOSIS — M549 Dorsalgia, unspecified: Secondary | ICD-10-CM | POA: Diagnosis not present

## 2021-12-21 DIAGNOSIS — R600 Localized edema: Secondary | ICD-10-CM | POA: Diagnosis not present

## 2021-12-21 DIAGNOSIS — M1990 Unspecified osteoarthritis, unspecified site: Secondary | ICD-10-CM | POA: Diagnosis not present

## 2022-01-03 ENCOUNTER — Ambulatory Visit (INDEPENDENT_AMBULATORY_CARE_PROVIDER_SITE_OTHER): Payer: Medicare HMO

## 2022-01-03 DIAGNOSIS — I442 Atrioventricular block, complete: Secondary | ICD-10-CM

## 2022-01-04 LAB — CUP PACEART REMOTE DEVICE CHECK
Battery Remaining Longevity: 19 mo
Battery Remaining Percentage: 20 %
Battery Voltage: 2.9 V
Date Time Interrogation Session: 20231108033135
Implantable Lead Connection Status: 753985
Implantable Lead Connection Status: 753985
Implantable Lead Connection Status: 753985
Implantable Lead Implant Date: 20161205
Implantable Lead Implant Date: 20161205
Implantable Lead Implant Date: 20161205
Implantable Lead Location: 753858
Implantable Lead Location: 753859
Implantable Lead Location: 753860
Implantable Pulse Generator Implant Date: 20161205
Lead Channel Impedance Value: 460 Ohm
Lead Channel Impedance Value: 950 Ohm
Lead Channel Pacing Threshold Amplitude: 0.75 V
Lead Channel Pacing Threshold Amplitude: 0.75 V
Lead Channel Pacing Threshold Pulse Width: 0.5 ms
Lead Channel Pacing Threshold Pulse Width: 0.8 ms
Lead Channel Sensing Intrinsic Amplitude: 12 mV
Lead Channel Setting Pacing Amplitude: 1.75 V
Lead Channel Setting Pacing Amplitude: 2.5 V
Lead Channel Setting Pacing Pulse Width: 0.5 ms
Lead Channel Setting Pacing Pulse Width: 0.8 ms
Lead Channel Setting Sensing Sensitivity: 2 mV
Pulse Gen Model: 3262
Pulse Gen Serial Number: 7802901

## 2022-01-15 ENCOUNTER — Ambulatory Visit (INDEPENDENT_AMBULATORY_CARE_PROVIDER_SITE_OTHER): Payer: Medicare HMO

## 2022-01-15 DIAGNOSIS — I5022 Chronic systolic (congestive) heart failure: Secondary | ICD-10-CM

## 2022-01-15 DIAGNOSIS — Z950 Presence of cardiac pacemaker: Secondary | ICD-10-CM | POA: Diagnosis not present

## 2022-01-16 DIAGNOSIS — H90A31 Mixed conductive and sensorineural hearing loss, unilateral, right ear with restricted hearing on the contralateral side: Secondary | ICD-10-CM | POA: Diagnosis not present

## 2022-01-16 DIAGNOSIS — H90A22 Sensorineural hearing loss, unilateral, left ear, with restricted hearing on the contralateral side: Secondary | ICD-10-CM | POA: Diagnosis not present

## 2022-01-16 NOTE — Progress Notes (Signed)
Spoke with patient and heart failure questions reviewed.  Transmission results reviewed.  Pt asymptomatic for fluid accumulation.  Reports feeling well at this time and voices no complaints.  No changes and encouraged to call if experiencing any fluid symptoms.

## 2022-01-16 NOTE — Progress Notes (Signed)
EPIC Encounter for ICM Monitoring  Patient Name: [name] is a 88 y.o. male Date: 01/16/2022 Primary Care Physican: Lawerance Cruel, MD Primary Cardiologist: Irish Lack Electrophysiologist: Lovena Le  BiV Pacing: 89% 07/10/2021 Office Weight: 181 lbs 12/15/2021 Weight: 180 lbs     Attempted call to patient and unable to reach.  Left message to return call. Transmission reviewed.    CorVue thoracic impedance suggesting possible fluid accumulation starting 11/16 and trending back to baseline.     Prescribed:  Torsemide 20 mg Take 1 tablet (20 mg total) by mouth as needed. Weight gain of 3lbs or more a day.    Labs: 01/23/2021 Creatinine 1.10, BUN 15, Potassium 4.0, Sodium 136 A complete set of results can be found in Results Review.   Recommendations:  Unable to reach.     Follow-up plan: ICM clinic phone appointment on 02/20/2022.  91 day device clinic remote transmission 04/04/2022.     EP/Cardiology Office Visits:   Recall 07/05/2022 with Dr. Irish Lack.  Recall 10/15/2022 with [name].    Copy of ICM check sent to Dr. Lovena Le.     3 month ICM trend: 01/15/2022.    12-14 Month ICM trend:     Rosalene Billings, RN 01/16/2022 8:08 AM

## 2022-01-22 NOTE — Progress Notes (Signed)
Remote pacemaker transmission.   

## 2022-02-14 DIAGNOSIS — G894 Chronic pain syndrome: Secondary | ICD-10-CM | POA: Diagnosis not present

## 2022-02-14 DIAGNOSIS — Z6828 Body mass index (BMI) 28.0-28.9, adult: Secondary | ICD-10-CM | POA: Diagnosis not present

## 2022-02-14 DIAGNOSIS — M47816 Spondylosis without myelopathy or radiculopathy, lumbar region: Secondary | ICD-10-CM | POA: Diagnosis not present

## 2022-02-14 DIAGNOSIS — M5416 Radiculopathy, lumbar region: Secondary | ICD-10-CM | POA: Diagnosis not present

## 2022-02-20 ENCOUNTER — Ambulatory Visit (INDEPENDENT_AMBULATORY_CARE_PROVIDER_SITE_OTHER): Payer: Medicare HMO

## 2022-02-20 DIAGNOSIS — Z950 Presence of cardiac pacemaker: Secondary | ICD-10-CM | POA: Diagnosis not present

## 2022-02-20 DIAGNOSIS — I5022 Chronic systolic (congestive) heart failure: Secondary | ICD-10-CM

## 2022-02-20 NOTE — Progress Notes (Signed)
EPIC Encounter for ICM Monitoring  Patient Name: [name] is a 88 y.o. male Date: 02/20/2022 Primary Care Physican: Lawerance Cruel, MD Primary Cardiologist: Irish Lack Electrophysiologist: Lovena Le  BiV Pacing: 88% 07/10/2021 Office Weight: 181 lbs 12/15/2021 Weight: 180 lbs 02/20/2022 Weight: 195 lbs     Spoke with patient and heart failure questions reviewed.  Transmission results reviewed.  Pt reports feeling tired and is getting over a cold.   He reports weight gain of 15 lbs in the last month and is not sure if it is caloric weight or fluid weight.    CorVue thoracic impedance suggesting possible fluid accumulation starting 12/22.     Prescribed:  Torsemide 20 mg Take 1 tablet (20 mg total) by mouth daily.   Labs: 01/23/2021 Creatinine 1.10, BUN 15, Potassium 4.0, Sodium 136 A complete set of results can be found in Results Review.   Recommendations:   Advised to take 2 tablets daily x 2 days only and then return to his prescribed dosage of 1 tablet a day.  He verbalized understanding.      Follow-up plan: ICM clinic phone appointment on 02/23/2022 (manual) to recheck fluid levels.  91 day device clinic remote transmission 04/04/2022.     EP/Cardiology Office Visits:   Recall 07/05/2022 with Dr. Irish Lack.  Recall 10/15/2022 with [name].    Copy of ICM check sent to Dr. Lovena Le.    3 month ICM trend: 02/20/2022.    12-14 Month ICM trend:     Rosalene Billings, RN 02/20/2022 3:17 PM

## 2022-02-21 NOTE — Progress Notes (Signed)
  Received: Claybon Jabs, Charlann Lange, MD  Shanina Kepple Panda, RN I agree with the increased torsemide.

## 2022-02-23 ENCOUNTER — Telehealth: Payer: Self-pay

## 2022-02-23 ENCOUNTER — Ambulatory Visit (INDEPENDENT_AMBULATORY_CARE_PROVIDER_SITE_OTHER): Payer: Medicare HMO

## 2022-02-23 DIAGNOSIS — Z950 Presence of cardiac pacemaker: Secondary | ICD-10-CM

## 2022-02-23 DIAGNOSIS — I5022 Chronic systolic (congestive) heart failure: Secondary | ICD-10-CM

## 2022-02-23 NOTE — Telephone Encounter (Signed)
Remote ICM transmission received.  Attempted call to patient regarding ICM remote transmission and left detailed message per DPR.  Advised to return call for any fluid symptoms or questions. Next ICM remote transmission scheduled 03/26/2022.

## 2022-02-23 NOTE — Progress Notes (Signed)
EPIC Encounter for ICM Monitoring  Patient Name: [name] is a 88 y.o. male Date: 02/23/2022 Primary Care Physican: Lawerance Cruel, MD Primary Cardiologist: Irish Lack Electrophysiologist: Lovena Le  BiV Pacing: 88% 07/10/2021 Office Weight: 181 lbs 12/15/2021 Weight: 180 lbs 02/20/2022 Weight: 195 lbs     Attempted call to patient and unable to reach.  Left detailed message per DPR regarding transmission. Transmission reviewed.    CorVue thoracic impedance suggesting fluid levels returned to normal after taking extra Torsemide x 2 days.     Prescribed:  Torsemide 20 mg Take 1 tablet (20 mg total) by mouth daily.   Labs: 01/23/2021 Creatinine 1.10, BUN 15, Potassium 4.0, Sodium 136 A complete set of results can be found in Results Review.   Recommendations:   Left voice mail with ICM number and encouraged to call if experiencing any fluid symptoms.    Follow-up plan: ICM clinic phone appointment on 03/26/2022.  91 day device clinic remote transmission 04/04/2022.     EP/Cardiology Office Visits:   Recall 07/05/2022 with Dr. Irish Lack.  Recall 10/15/2022 with [name].    Copy of ICM check sent to Dr. Lovena Le.    3 month ICM trend: 02/23/2022.    12-14 Month ICM trend:     Rosalene Billings, RN 02/23/2022 9:01 AM

## 2022-03-02 DIAGNOSIS — M47816 Spondylosis without myelopathy or radiculopathy, lumbar region: Secondary | ICD-10-CM | POA: Diagnosis not present

## 2022-03-07 ENCOUNTER — Encounter (INDEPENDENT_AMBULATORY_CARE_PROVIDER_SITE_OTHER): Payer: Medicare HMO | Admitting: Ophthalmology

## 2022-03-07 DIAGNOSIS — I10 Essential (primary) hypertension: Secondary | ICD-10-CM | POA: Diagnosis not present

## 2022-03-07 DIAGNOSIS — H43811 Vitreous degeneration, right eye: Secondary | ICD-10-CM | POA: Diagnosis not present

## 2022-03-07 DIAGNOSIS — H348322 Tributary (branch) retinal vein occlusion, left eye, stable: Secondary | ICD-10-CM

## 2022-03-07 DIAGNOSIS — H353221 Exudative age-related macular degeneration, left eye, with active choroidal neovascularization: Secondary | ICD-10-CM | POA: Diagnosis not present

## 2022-03-07 DIAGNOSIS — H353112 Nonexudative age-related macular degeneration, right eye, intermediate dry stage: Secondary | ICD-10-CM | POA: Diagnosis not present

## 2022-03-07 DIAGNOSIS — H35033 Hypertensive retinopathy, bilateral: Secondary | ICD-10-CM

## 2022-03-09 DIAGNOSIS — J320 Chronic maxillary sinusitis: Secondary | ICD-10-CM | POA: Diagnosis not present

## 2022-03-09 DIAGNOSIS — R0981 Nasal congestion: Secondary | ICD-10-CM | POA: Diagnosis not present

## 2022-03-09 DIAGNOSIS — R0982 Postnasal drip: Secondary | ICD-10-CM | POA: Diagnosis not present

## 2022-03-15 DIAGNOSIS — M47816 Spondylosis without myelopathy or radiculopathy, lumbar region: Secondary | ICD-10-CM | POA: Diagnosis not present

## 2022-03-21 DIAGNOSIS — H510 Palsy (spasm) of conjugate gaze: Secondary | ICD-10-CM | POA: Diagnosis not present

## 2022-03-21 DIAGNOSIS — H547 Unspecified visual loss: Secondary | ICD-10-CM | POA: Diagnosis not present

## 2022-03-21 DIAGNOSIS — Z888 Allergy status to other drugs, medicaments and biological substances status: Secondary | ICD-10-CM | POA: Diagnosis not present

## 2022-03-21 DIAGNOSIS — H3500 Unspecified background retinopathy: Secondary | ICD-10-CM | POA: Diagnosis not present

## 2022-03-21 DIAGNOSIS — H3562 Retinal hemorrhage, left eye: Secondary | ICD-10-CM | POA: Diagnosis not present

## 2022-03-21 DIAGNOSIS — Z961 Presence of intraocular lens: Secondary | ICD-10-CM | POA: Diagnosis not present

## 2022-03-21 DIAGNOSIS — H532 Diplopia: Secondary | ICD-10-CM | POA: Diagnosis not present

## 2022-03-21 DIAGNOSIS — S0592XA Unspecified injury of left eye and orbit, initial encounter: Secondary | ICD-10-CM | POA: Diagnosis not present

## 2022-03-21 DIAGNOSIS — H4921 Sixth [abducent] nerve palsy, right eye: Secondary | ICD-10-CM | POA: Diagnosis not present

## 2022-03-21 DIAGNOSIS — Z885 Allergy status to narcotic agent status: Secondary | ICD-10-CM | POA: Diagnosis not present

## 2022-03-21 DIAGNOSIS — H50011 Monocular esotropia, right eye: Secondary | ICD-10-CM | POA: Diagnosis not present

## 2022-03-22 DIAGNOSIS — M47816 Spondylosis without myelopathy or radiculopathy, lumbar region: Secondary | ICD-10-CM | POA: Diagnosis not present

## 2022-03-25 DIAGNOSIS — Z0100 Encounter for examination of eyes and vision without abnormal findings: Secondary | ICD-10-CM | POA: Diagnosis not present

## 2022-03-26 ENCOUNTER — Ambulatory Visit: Payer: Medicare HMO | Attending: Internal Medicine

## 2022-03-26 DIAGNOSIS — I5022 Chronic systolic (congestive) heart failure: Secondary | ICD-10-CM

## 2022-03-26 DIAGNOSIS — Z950 Presence of cardiac pacemaker: Secondary | ICD-10-CM

## 2022-03-28 ENCOUNTER — Telehealth: Payer: Self-pay

## 2022-03-28 NOTE — Progress Notes (Signed)
EPIC Encounter for ICM Monitoring  Patient Name: [name] is a 89 y.o. male Date: 03/28/2022 Primary Care Physican: Lawerance Cruel, MD Primary Cardiologist: Irish Lack Electrophysiologist: Lovena Le  BiV Pacing: 87% 07/10/2021 Office Weight: 181 lbs 12/15/2021 Weight: 180 lbs 02/20/2022 Weight: 195 lbs     Attempted call to patient and unable to reach.  Left detailed message per DPR regarding transmission. Transmission reviewed.    CorVue thoracic impedance suggesting possible dryness starting 1/27.  Possible fluid accumulation 1/15-1/21.   Prescribed:  Torsemide 20 mg Take 1 tablet (20 mg total) by mouth daily.   Labs: 01/23/2021 Creatinine 1.10, BUN 15, Potassium 4.0, Sodium 136 A complete set of results can be found in Results Review.   Recommendations:   Left voice mail with ICM number and encouraged to call if experiencing any fluid symptoms.    Follow-up plan: ICM clinic phone appointment on 04/30/2022.  91 day device clinic remote transmission 04/04/2022.     EP/Cardiology Office Visits:   Recall 07/05/2022 with Dr. Irish Lack.  Recall 10/15/2022 with [name].    Copy of ICM check sent to Dr. Lovena Le.     3 month ICM trend: 03/26/2022.    12-14 Month ICM trend:     Rosalene Billings, RN 03/28/2022 2:55 PM

## 2022-03-28 NOTE — Progress Notes (Signed)
Spoke with patient and heart failure questions reviewed.  Transmission results reviewed.  Pt asymptomatic for fluid accumulation.  Reports feeling well at this time and voices no complaints.  He is feeling well and drinking plenty of fluids.  No changes and encouraged to call if experiencing any fluid symptoms.

## 2022-03-28 NOTE — Telephone Encounter (Signed)
Remote ICM transmission received.  Attempted call to patient regarding ICM remote transmission and left detailed message per DPR to return call.   

## 2022-04-03 ENCOUNTER — Other Ambulatory Visit: Payer: Self-pay | Admitting: Adult Health

## 2022-04-03 DIAGNOSIS — Z8719 Personal history of other diseases of the digestive system: Secondary | ICD-10-CM

## 2022-04-04 ENCOUNTER — Ambulatory Visit (INDEPENDENT_AMBULATORY_CARE_PROVIDER_SITE_OTHER): Payer: Medicare HMO

## 2022-04-04 DIAGNOSIS — I442 Atrioventricular block, complete: Secondary | ICD-10-CM

## 2022-04-04 LAB — CUP PACEART REMOTE DEVICE CHECK
Battery Remaining Longevity: 17 mo
Battery Remaining Percentage: 18 %
Battery Voltage: 2.87 V
Date Time Interrogation Session: 20240207040015
Implantable Lead Connection Status: 753985
Implantable Lead Connection Status: 753985
Implantable Lead Connection Status: 753985
Implantable Lead Implant Date: 20161205
Implantable Lead Implant Date: 20161205
Implantable Lead Implant Date: 20161205
Implantable Lead Location: 753858
Implantable Lead Location: 753859
Implantable Lead Location: 753860
Implantable Pulse Generator Implant Date: 20161205
Lead Channel Impedance Value: 490 Ohm
Lead Channel Impedance Value: 980 Ohm
Lead Channel Pacing Threshold Amplitude: 0.75 V
Lead Channel Pacing Threshold Amplitude: 0.75 V
Lead Channel Pacing Threshold Pulse Width: 0.5 ms
Lead Channel Pacing Threshold Pulse Width: 0.8 ms
Lead Channel Sensing Intrinsic Amplitude: 12 mV
Lead Channel Setting Pacing Amplitude: 1.75 V
Lead Channel Setting Pacing Amplitude: 2.5 V
Lead Channel Setting Pacing Pulse Width: 0.5 ms
Lead Channel Setting Pacing Pulse Width: 0.8 ms
Lead Channel Setting Sensing Sensitivity: 2 mV
Pulse Gen Model: 3262
Pulse Gen Serial Number: 7802901

## 2022-04-19 ENCOUNTER — Other Ambulatory Visit: Payer: Self-pay

## 2022-04-19 DIAGNOSIS — I5042 Chronic combined systolic (congestive) and diastolic (congestive) heart failure: Secondary | ICD-10-CM

## 2022-04-19 DIAGNOSIS — Z6828 Body mass index (BMI) 28.0-28.9, adult: Secondary | ICD-10-CM | POA: Diagnosis not present

## 2022-04-19 DIAGNOSIS — M47816 Spondylosis without myelopathy or radiculopathy, lumbar region: Secondary | ICD-10-CM | POA: Diagnosis not present

## 2022-04-19 MED ORDER — TORSEMIDE 20 MG PO TABS: 20.0000 mg | ORAL_TABLET | Freq: Every day | ORAL | 2 refills | Status: DC

## 2022-04-28 ENCOUNTER — Encounter (HOSPITAL_COMMUNITY): Payer: Self-pay

## 2022-04-28 ENCOUNTER — Emergency Department (HOSPITAL_COMMUNITY): Payer: Medicare HMO

## 2022-04-28 ENCOUNTER — Emergency Department (HOSPITAL_COMMUNITY)
Admission: EM | Admit: 2022-04-28 | Discharge: 2022-04-29 | Disposition: A | Discharge status: Home / Self Care | Payer: Medicare HMO | Attending: Emergency Medicine | Admitting: Emergency Medicine

## 2022-04-28 DIAGNOSIS — N189 Chronic kidney disease, unspecified: Secondary | ICD-10-CM | POA: Diagnosis not present

## 2022-04-28 DIAGNOSIS — Z950 Presence of cardiac pacemaker: Secondary | ICD-10-CM | POA: Insufficient documentation

## 2022-04-28 DIAGNOSIS — W010XXA Fall on same level from slipping, tripping and stumbling without subsequent striking against object, initial encounter: Secondary | ICD-10-CM | POA: Diagnosis not present

## 2022-04-28 DIAGNOSIS — W19XXXA Unspecified fall, initial encounter: Secondary | ICD-10-CM | POA: Diagnosis not present

## 2022-04-28 DIAGNOSIS — I10 Essential (primary) hypertension: Secondary | ICD-10-CM | POA: Diagnosis not present

## 2022-04-28 DIAGNOSIS — Z7982 Long term (current) use of aspirin: Secondary | ICD-10-CM | POA: Insufficient documentation

## 2022-04-28 DIAGNOSIS — G8929 Other chronic pain: Secondary | ICD-10-CM | POA: Diagnosis not present

## 2022-04-28 DIAGNOSIS — M25522 Pain in left elbow: Secondary | ICD-10-CM | POA: Insufficient documentation

## 2022-04-28 DIAGNOSIS — M545 Low back pain, unspecified: Secondary | ICD-10-CM | POA: Insufficient documentation

## 2022-04-28 DIAGNOSIS — Z043 Encounter for examination and observation following other accident: Secondary | ICD-10-CM | POA: Diagnosis not present

## 2022-04-28 DIAGNOSIS — I129 Hypertensive chronic kidney disease with stage 1 through stage 4 chronic kidney disease, or unspecified chronic kidney disease: Secondary | ICD-10-CM | POA: Insufficient documentation

## 2022-04-28 DIAGNOSIS — D181 Lymphangioma, any site: Secondary | ICD-10-CM | POA: Diagnosis not present

## 2022-04-28 DIAGNOSIS — Z7902 Long term (current) use of antithrombotics/antiplatelets: Secondary | ICD-10-CM | POA: Diagnosis not present

## 2022-04-28 DIAGNOSIS — S0101XA Laceration without foreign body of scalp, initial encounter: Secondary | ICD-10-CM | POA: Diagnosis not present

## 2022-04-28 DIAGNOSIS — S0990XA Unspecified injury of head, initial encounter: Secondary | ICD-10-CM | POA: Diagnosis present

## 2022-04-28 DIAGNOSIS — M4126 Other idiopathic scoliosis, lumbar region: Secondary | ICD-10-CM | POA: Diagnosis not present

## 2022-04-28 LAB — CBC WITH DIFFERENTIAL/PLATELET
Abs Immature Granulocytes: 0.04 10*3/uL (ref 0.00–0.07)
Basophils Absolute: 0 10*3/uL (ref 0.0–0.1)
Basophils Relative: 1 %
Eosinophils Absolute: 0.1 10*3/uL (ref 0.0–0.5)
Eosinophils Relative: 2 %
HCT: 42.4 % (ref 39.0–52.0)
Hemoglobin: 14.6 g/dL (ref 13.0–17.0)
Immature Granulocytes: 1 %
Lymphocytes Relative: 32 %
Lymphs Abs: 2.1 10*3/uL (ref 0.7–4.0)
MCH: 31.5 pg (ref 26.0–34.0)
MCHC: 34.4 g/dL (ref 30.0–36.0)
MCV: 91.6 fL (ref 80.0–100.0)
Monocytes Absolute: 0.9 10*3/uL (ref 0.1–1.0)
Monocytes Relative: 14 %
Neutro Abs: 3.5 10*3/uL (ref 1.7–7.7)
Neutrophils Relative %: 50 %
Platelets: 142 10*3/uL — ABNORMAL LOW (ref 150–400)
RBC: 4.63 MIL/uL (ref 4.22–5.81)
RDW: 14.6 % (ref 11.5–15.5)
WBC: 6.8 10*3/uL (ref 4.0–10.5)
nRBC: 0 % (ref 0.0–0.2)

## 2022-04-28 LAB — BASIC METABOLIC PANEL
Anion gap: 12 (ref 5–15)
BUN: 19 mg/dL (ref 8–23)
CO2: 26 mmol/L (ref 22–32)
Calcium: 9 mg/dL (ref 8.9–10.3)
Chloride: 95 mmol/L — ABNORMAL LOW (ref 98–111)
Creatinine, Ser: 1.21 mg/dL (ref 0.61–1.24)
GFR, Estimated: 56 mL/min — ABNORMAL LOW (ref >60)
Glucose, Bld: 102 mg/dL — ABNORMAL HIGH (ref 70–99)
Potassium: 4.1 mmol/L (ref 3.5–5.1)
Sodium: 133 mmol/L — ABNORMAL LOW (ref 135–145)

## 2022-04-28 LAB — CK: Total CK: 66 U/L (ref 49–397)

## 2022-04-28 LAB — PROTIME-INR
INR: 1 (ref 0.8–1.2)
Prothrombin Time: 13.3 seconds (ref 11.4–15.2)

## 2022-04-28 LAB — APTT: aPTT: 28 seconds (ref 24–36)

## 2022-04-28 MED ORDER — TORSEMIDE 20 MG PO TABS
20.0000 mg | ORAL_TABLET | Freq: Every day | ORAL | Status: DC
  Administered 2022-04-29: 20 mg via ORAL
  Filled 2022-04-28: qty 1

## 2022-04-28 MED ORDER — DORZOLAMIDE HCL-TIMOLOL MAL 2-0.5 % OP SOLN: 1.0000 [drp] | Freq: Two times a day (BID) | OPHTHALMIC | Status: DC

## 2022-04-28 MED ORDER — TAMSULOSIN HCL 0.4 MG PO CAPS
0.4000 mg | ORAL_CAPSULE | Freq: Every day | ORAL | Status: DC
  Administered 2022-04-29: 0.4 mg via ORAL
  Filled 2022-04-28: qty 1

## 2022-04-28 MED ORDER — ACETAMINOPHEN 500 MG PO TABS: 1000.0000 mg | ORAL_TABLET | Freq: Once | ORAL | Status: DC

## 2022-04-28 MED ORDER — ACETAMINOPHEN 325 MG PO TABS
650.0000 mg | ORAL_TABLET | Freq: Four times a day (QID) | ORAL | Status: DC | PRN
  Administered 2022-04-29 (×2): 650 mg via ORAL
  Filled 2022-04-28 (×2): qty 2

## 2022-04-28 MED ORDER — LIDOCAINE 5 % EX PTCH
1.0000 | MEDICATED_PATCH | Freq: Every day | CUTANEOUS | Status: DC | PRN
  Administered 2022-04-29: 1 via TRANSDERMAL
  Filled 2022-04-28: qty 1

## 2022-04-28 MED ORDER — ASPIRIN 81 MG PO CHEW
81.0000 mg | CHEWABLE_TABLET | Freq: Every day | ORAL | Status: DC
  Administered 2022-04-29: 81 mg via ORAL
  Filled 2022-04-28: qty 1

## 2022-04-28 MED ORDER — BRIMONIDINE TARTRATE 0.15 % OP SOLN: 1.0000 [drp] | Freq: Two times a day (BID) | OPHTHALMIC | Status: DC

## 2022-04-28 MED ORDER — TETANUS-DIPHTH-ACELL PERTUSSIS 5-2.5-18.5 LF-MCG/0.5 IM SUSY: 0.5000 mL | PREFILLED_SYRINGE | Freq: Once | INTRAMUSCULAR | Status: DC

## 2022-04-28 MED ORDER — ADULT MULTIVITAMIN W/MINERALS CH
1.0000 | ORAL_TABLET | Freq: Every day | ORAL | Status: DC
  Administered 2022-04-29: 1 via ORAL
  Filled 2022-04-28: qty 1

## 2022-04-28 MED ORDER — LIDOCAINE-EPINEPHRINE-TETRACAINE (LET) TOPICAL GEL
3.0000 mL | Freq: Once | TOPICAL | Status: AC
  Administered 2022-04-28: 3 mL via TOPICAL
  Filled 2022-04-28: qty 3

## 2022-04-28 MED ORDER — OXYCODONE-ACETAMINOPHEN 5-325 MG PO TABS
0.5000 | ORAL_TABLET | Freq: Once | ORAL | Status: AC
  Administered 2022-04-28: 0.5 via ORAL
  Filled 2022-04-28: qty 1

## 2022-04-28 NOTE — ED Provider Notes (Signed)
..  Laceration Repair  Date/Time: 04/28/2022 8:58 PM  Performed by: Bud Face, PA-C Authorized by: Bud Face, PA-C   Consent:    Consent obtained:  Verbal   Consent given by:  Patient   Risks, benefits, and alternatives were discussed: yes     Risks discussed:  Infection, need for additional repair, nerve damage, poor wound healing, poor cosmetic result, pain, retained foreign body, tendon damage and vascular damage   Alternatives discussed:  No treatment, delayed treatment, observation and referral Universal protocol:    Procedure explained and questions answered to patient or proxy's satisfaction: yes     Test results available: yes     Imaging studies available: yes     Patient identity confirmed:  Verbally with patient Anesthesia:    Anesthesia method:  Topical application   Topical anesthetic:  LET Laceration details:    Location:  Scalp   Scalp location:  Occipital   Length (cm):  2   Depth (mm):  2 Exploration:    Hemostasis achieved with:  LET   Imaging outcome: foreign body not noted     Wound exploration: wound explored through full range of motion and entire depth of wound visualized   Treatment:    Area cleansed with:  Saline   Amount of cleaning:  Standard   Irrigation solution:  Sterile saline   Irrigation volume:  500 ml   Irrigation method:  Pressure wash Skin repair:    Repair method:  Staples   Number of staples:  4 Approximation:    Approximation:  Close Repair type:    Repair type:  Simple Post-procedure details:    Dressing:  Open (no dressing)   Procedure completion:  Tolerated well, no immediate complications     Bud Face, PA-C 04/28/22 2100    Elgie Congo, MD 04/28/22 2105

## 2022-04-28 NOTE — ED Notes (Signed)
Patient transported to CT 

## 2022-04-28 NOTE — ED Provider Notes (Signed)
[facility] Provider Note   CSN: [id number] Arrival date & time: 04/28/22  1830     History  Chief Complaint  Patient presents with   Trauma    [name] is a 89 y.o. male.  With PMH of HTN, HLD, CAD on Plavix and aspirin, CKD, third-degree AV block status post pacemaker brought in by EMS from home after fall with no loss of consciousness.  Patient typically ambulates with a walker.  He was at home trying to go to the bathroom when he was about to sit backwards and lost his balance and fell backwards hitting his head and shoulder onto the ground.  He was on the ground for about an hour and 1/2 to 2 hours before he was able to reach family who called EMS to pick him up.  When EMS arrived they were able to stand him.  He is mainly complaining of pain in the back of his head.  He denies any loss of consciousness.  He denies any preceding symptoms leading the fall, no chest pain, no shortness of breath, no palpitations lightheadedness or dizziness.  He is currently not complaining of any pain elsewhere.  He does note having chronic pain in his left elbow as well as his lower back.  He says he has bad arthritis in his left elbow.  HPI     Home Medications Prior to Admission medications   Medication Sig Start Date End Date Taking? Authorizing Provider  acetaminophen (TYLENOL) 500 MG tablet Take 1,000 mg by mouth as needed. And take two by mouth twice daily at 6 am and 4:30 pm for pain    [provider]  bisacodyl (DULCOLAX) 10 MG suppository Place 10 mg rectally as needed for moderate constipation. If not relieved by MOM    [provider]  brimonidine (ALPHAGAN P) 0.1 % SOLN Place 1 drop into both eyes 2 (two) times daily.  08/11/15   [provider]  clopidogrel (PLAVIX) 75 MG tablet Take 1 tablet (75 mg total) by mouth daily. 03/08/21   Medina-Vargas, Monina C, NP  cyclobenzaprine (FLEXERIL) 5 MG tablet Take 0.5  tablets (2.5 mg total) by mouth at bedtime as needed. 07/10/21   Jettie Booze, MD  dorzolamide-timolol (COSOPT) 22.3-6.8 MG/ML ophthalmic solution Place 1 drop into both eyes 2 (two) times daily.  11/12/13   [provider]  nitroGLYCERIN (NITROSTAT) 0.4 MG SL tablet Place 1 tablet (0.4 mg total) under the tongue every 5 (five) minutes as needed for chest pain. 03/08/21   Medina-Vargas, Monina C, NP  Sodium Phosphates (RA SALINE ENEMA RE) Place 1 Bottle rectally daily as needed. If not relieved by Biscodyl suppository or Milk of Magnesia, Contact Dr. If no relief is given after Enema    [provider]  tamsulosin (FLOMAX) 0.4 MG CAPS capsule Take 1 capsule (0.4 mg total) by mouth daily. 03/08/21   Medina-Vargas, Monina C, NP  torsemide (DEMADEX) 20 MG tablet Take 1 tablet (20 mg total) by mouth daily. 04/19/22   Evans Lance, MD      Allergies    Gabapentin, Parecoxib sodium [parecoxib], Tramadol, Codeine, Omeprazole, and Warfarin sodium    Review of Systems   Review of Systems  Physical Exam Updated Vital Signs BP (!) 145/88   Pulse 76   Temp 97.9 F (36.6 C) (Oral)   Resp 15   Ht '5\' 7"'$  (1.702 m)   Wt 81.6 kg  SpO2 100%   BMI 28.19 kg/m  Physical Exam Constitutional: Alert and orientedx4.  Pleasant gentleman, no acute distress, GCS 15 Eyes: Conjunctivae are normal. ENT      Head: Normocephalic and occipital scalp contusion with overlying hemostatic 2 cm linear vertical laceration.      Nose: No congestion.      Neck/spine: Lower lumbar spinal tenderness to palpation no step-offs or deformities Cardiovascular: S1, S2,  Normal and symmetric distal pulses are present in all extremities.Warm and well perfused.  Pacemaker present Respiratory: Normal respiratory effort. Breath sounds are normal.  O2 sat 98 on RA Gastrointestinal: Soft and nontender.  Musculoskeletal: Normal range of motion in all extremities. Large Skin abrasion hemostatic overlying left  scapula.  Mild tenderness of left elbow without any associated traumatic injury evidence.  Full range of motion of left upper extremity intact. Sensation and pulses intact.      Right lower leg: No tenderness or edema.      Left lower leg: No tenderness or edema. Neurologic: Normal speech and language.  Moves extremities x 4 equally.  Sensation grossly intact.  No facial droop.  No gross focal neurologic deficits are appreciated. Skin: Skin is warm, dry. Large Skin abrasion hemostatic overlying left scapula.  Psychiatric: Mood and affect are normal. Speech and behavior are normal.  ED Results / Procedures / Treatments   Labs (all labs ordered are listed, but only abnormal results are displayed) Labs Reviewed  CBC WITH DIFFERENTIAL/PLATELET - Abnormal; Notable for the following components:      Result Value   Platelets 142 (*)    All other components within normal limits  BASIC METABOLIC PANEL - Abnormal; Notable for the following components:   Sodium 133 (*)    Chloride 95 (*)    Glucose, Bld 102 (*)    GFR, Estimated 56 (*)    All other components within normal limits  PROTIME-INR  APTT  CK  URINALYSIS, ROUTINE W REFLEX MICROSCOPIC    EKG EKG Interpretation  Date/Time:  Saturday April 28 2022 18:55:12 EST Ventricular Rate:  75 PR Interval:    QRS Duration: 177 QT Interval:  472 QTC Calculation: 528 R Axis:   117 Text Interpretation: Atrial fibrillation IVCD, consider atypical RBBB Intermittent PVCs Lateral infarct, old Confirmed by Georgina Snell [phone]) on 04/28/2022 6:58:53 PM  Radiology CT Head Wo Contrast  Result Date: 04/28/2022 CLINICAL DATA:  Fall. EXAM: CT HEAD WITHOUT CONTRAST CT CERVICAL SPINE WITHOUT CONTRAST TECHNIQUE: Multidetector CT imaging of the head and cervical spine was performed following the standard protocol without intravenous contrast. Multiplanar CT image reconstructions of the cervical spine were also generated. RADIATION DOSE REDUCTION: This exam  was performed according to the departmental dose-optimization program which includes automated exposure control, adjustment of the mA and/or kV according to patient size and/or use of iterative reconstruction technique. COMPARISON:  Head CT dated 10/30/2021. FINDINGS: CT HEAD FINDINGS Brain: Moderate age-related atrophy and chronic microvascular ischemic changes. Left hemispheric low attenuating subdural collection most consistent with an old hygroma. There is however interval increase in the size of the formal compared to prior CT measuring approximately 11 mm in thickness over the left temporal lobe (previously 8 mm). There is associated mild mass effect and proximally 5 mm left-to-right midline shift measured at the level of the foramen Monro, similar to prior CT. No acute intracranial hemorrhage. Vascular: No hyperdense vessel or unexpected calcification. Skull: Normal. Negative for fracture or focal lesion. Sinuses/Orbits: Mild diffuse mucoperiosteal thickening of paranasal sinuses.  Postsurgical changes of the right maxillary sinus. No air-fluid level. The mastoid air cells are clear. Other: None CT CERVICAL SPINE FINDINGS Alignment: No acute subluxation. Skull base and vertebrae: No acute fracture.  Osteopenia. Soft tissues and spinal canal: No prevertebral fluid or swelling. No visible canal hematoma. Disc levels:  No acute findings.  Degenerative changes. Upper chest: Negative. Other: Bilateral carotid bulb calcified plaques. Partially visualized pacemaker wire. IMPRESSION: 1. No acute intracranial pathology. 2. Moderate age-related atrophy and chronic microvascular ischemic changes. 3. Interval increase in the size of the left hemispheric old subdural hygroma. Associated mass effect and approximately 5 mm left right midline shift similar to prior CT. 4. No acute/traumatic cervical spine pathology. Electronically Signed   By: Anner Crete M.D.   On: 04/28/2022 19:53   CT Cervical Spine Wo  Contrast  Result Date: 04/28/2022 CLINICAL DATA:  Fall. EXAM: CT HEAD WITHOUT CONTRAST CT CERVICAL SPINE WITHOUT CONTRAST TECHNIQUE: Multidetector CT imaging of the head and cervical spine was performed following the standard protocol without intravenous contrast. Multiplanar CT image reconstructions of the cervical spine were also generated. RADIATION DOSE REDUCTION: This exam was performed according to the departmental dose-optimization program which includes automated exposure control, adjustment of the mA and/or kV according to patient size and/or use of iterative reconstruction technique. COMPARISON:  Head CT dated 10/30/2021. FINDINGS: CT HEAD FINDINGS Brain: Moderate age-related atrophy and chronic microvascular ischemic changes. Left hemispheric low attenuating subdural collection most consistent with an old hygroma. There is however interval increase in the size of the formal compared to prior CT measuring approximately 11 mm in thickness over the left temporal lobe (previously 8 mm). There is associated mild mass effect and proximally 5 mm left-to-right midline shift measured at the level of the foramen Monro, similar to prior CT. No acute intracranial hemorrhage. Vascular: No hyperdense vessel or unexpected calcification. Skull: Normal. Negative for fracture or focal lesion. Sinuses/Orbits: Mild diffuse mucoperiosteal thickening of paranasal sinuses. Postsurgical changes of the right maxillary sinus. No air-fluid level. The mastoid air cells are clear. Other: None CT CERVICAL SPINE FINDINGS Alignment: No acute subluxation. Skull base and vertebrae: No acute fracture.  Osteopenia. Soft tissues and spinal canal: No prevertebral fluid or swelling. No visible canal hematoma. Disc levels:  No acute findings.  Degenerative changes. Upper chest: Negative. Other: Bilateral carotid bulb calcified plaques. Partially visualized pacemaker wire. IMPRESSION: 1. No acute intracranial pathology. 2. Moderate age-related  atrophy and chronic microvascular ischemic changes. 3. Interval increase in the size of the left hemispheric old subdural hygroma. Associated mass effect and approximately 5 mm left right midline shift similar to prior CT. 4. No acute/traumatic cervical spine pathology. Electronically Signed   By: Anner Crete M.D.   On: 04/28/2022 19:53   CT Lumbar Spine Wo Contrast  Result Date: 04/28/2022 CLINICAL DATA:  Fall, back pain EXAM: CT LUMBAR SPINE WITHOUT CONTRAST TECHNIQUE: Multidetector CT imaging of the lumbar spine was performed without intravenous contrast administration. Multiplanar CT image reconstructions were also generated. RADIATION DOSE REDUCTION: This exam was performed according to the departmental dose-optimization program which includes automated exposure control, adjustment of the mA and/or kV according to patient size and/or use of iterative reconstruction technique. COMPARISON:  CT abdomen/pelvis dated 01/24/2021 FINDINGS: Segmentation: 5 lumbar type vertebral bodies. Alignment: Normal lumbar lordosis.  Moderate lumbar dextroscoliosis. Vertebrae: No acute fracture or focal pathologic process. Paraspinal and other soft tissues: Status post cholecystectomy. Atherosclerotic calcifications the abdominal aorta and branch vessels. Sigmoid diverticulosis, without evidence of diverticulitis.  Disc levels: Moderate degenerative changes, most prominent at L3-4 and L4-5. Spinal canal is mildly narrowed but patent. IMPRESSION: No traumatic injury to the lumbar spine. Moderate degenerative changes with lumbar dextroscoliosis. Electronically Signed   By: Julian Hy M.D.   On: 04/28/2022 19:49   DG Chest Portable 1 View  Result Date: 04/28/2022 CLINICAL DATA:  Fall EXAM: PORTABLE CHEST 1 VIEW COMPARISON:  01/23/2021 FINDINGS: Left pacer remains in place, unchanged. Heart and mediastinal contours within normal limits. Diffuse aortic atherosclerosis. No confluent airspace opacities or effusions. No  acute bony abnormality. IMPRESSION: No active disease. Electronically Signed   By: Rolm Baptise M.D.   On: 04/28/2022 19:05   DG Pelvis Portable  Result Date: 04/28/2022 CLINICAL DATA:  Fall EXAM: PORTABLE PELVIS 1-2 VIEWS COMPARISON:  01/23/2021 FINDINGS: Bilateral hip replacements and penile implant noted. Apparent fusion across the SI joints. No acute bony abnormality. Specifically, no fracture, subluxation, or dislocation. Diffuse vascular calcifications. IMPRESSION: No acute bony abnormality. Electronically Signed   By: Rolm Baptise M.D.   On: 04/28/2022 19:04   DG Elbow 2 Views Left  Result Date: 04/28/2022 CLINICAL DATA:  Fall EXAM: LEFT ELBOW - 2 VIEW COMPARISON:  02/09/2019 FINDINGS: Arthritic changes throughout the left elbow with joint space narrowing and spurring. No acute bony abnormality. Specifically, no fracture, subluxation, or dislocation. No joint effusion. IMPRESSION: Advanced arthritic changes.  No acute bony abnormality. Electronically Signed   By: Rolm Baptise M.D.   On: 04/28/2022 19:03    Procedures Procedures    Medications Ordered in ED Medications  lidocaine-EPINEPHrine-tetracaine (LET) topical gel (3 mLs Topical Given 04/28/22 2036)  oxyCODONE-acetaminophen (PERCOCET/ROXICET) 5-325 MG per tablet 0.5 tablet (0.5 tablets Oral Given 04/28/22 2036)    ED Course/ Medical Decision Making/ A&P Clinical Course as of 04/28/22 2111  Sat Apr 28, 2022  1910 DG Chest Portable 1 View [VB]  2046 Nundkumar called due to hygroma. Nothing to do from hygroma standpoint. Recommends holding Plavix and continue aspirin likely indefinitely. [VB]    Clinical Course User Index [VB] Elgie Congo, MD                            Medical Decision Making OTNIEL PHENG is a 89 y.o. male.  With PMH of HTN, HLD, CAD on Plavix and aspirin, CKD, third-degree AV block status post pacemaker brought in by EMS from home after fall with no loss of consciousness.  Patient presents  hemodynamically stable, neuro intact with GCS 15.  Primary trauma survey intact.  Secondary trauma survey notable for occipital scalp contusion with associated 2 cm vertical hemostatic laceration.  Lower lumbar spine tenderness.  Large Skin abrasion hemostatic overlying left scapula.  No bony tenderness associated with it.  Left elbow tenderness to palpation.  Last Tdap 2022.  Trauma scans obtained of head, C-spine and L-spine.  No acute traumatic injuries.  Personally reviewed by me.  Chest x-ray, pelvis x-ray and left elbow x-ray also without acute traumatic injuries.  On CT head there was evidence of chronic subdural hygroma slightly increased in size with similar midline shift to prior CT.  Discussed with Dr. Kathyrn Sheriff called due to hygroma. Nothing to do from hygroma standpoint. Recommends holding Plavix and continue aspirin likely indefinitely. [VB]  There are no findings on imaging or labs today that would require inpatient admission.  He is mildly hyponatremic 133 which appears to be baseline for patient with creatinine 1.21.  No acute  anemia.  Normal CK.  Patient feeling not ready to walk or take care of him self at home.  Discussed with family at bedside plans for social work consult for possible SNF placement with PT OT evaluation.  They are understanding.  Social work request consulted and PT OT ordered.  Will have pharmacy review medications. Plan to hold plavix.   Amount and/or Complexity of Data Reviewed Labs: ordered. Radiology: ordered. Decision-making details documented in ED Course.  Risk OTC drugs. Prescription drug management.    Final Clinical Impression(s) / ED Diagnoses Final diagnoses:  Fall, initial encounter  Laceration of scalp, initial encounter  Hygroma    Rx / DC Orders ED Discharge Orders     None         Elgie Congo, MD 04/28/22 2111

## 2022-04-28 NOTE — Discharge Instructions (Signed)
You have been seen in the Emergency Department (ED) today following a fall.  Your workup today did not reveal any injuries that require you to stay in the hospital. You can expect to be stiff and sore for the next several days.  Please take Tylenol or Motrin as needed for pain, but only as written on the box.  Please follow up with your primary care doctor as soon as possible regarding today's ED visit and your recent accident. Get your staples removed 1 week from the time you were in the ER.  Stop taking your Plavix.   Call your doctor or return to the ED if you develop a sudden or severe headache, confusion, slurred speech, facial droop, weakness or numbness in any arm or leg,  extreme fatigue, vomiting more than two times, severe abdominal pain, difficulty breathing or any other concerning signs or symptoms.

## 2022-04-28 NOTE — Progress Notes (Signed)
Orthopedic Tech Progress Note Patient Details:  [name] 14-Feb-1927 [id number]  Level II trauma  Patient ID: [name], male   DOB: March 23, 1926, 89 y.o.   MRN: [id number]  [name] 04/28/2022, 7:16 PM

## 2022-04-29 ENCOUNTER — Other Ambulatory Visit: Payer: Self-pay

## 2022-04-29 LAB — URINALYSIS, ROUTINE W REFLEX MICROSCOPIC
Bilirubin Urine: NEGATIVE
Glucose, UA: NEGATIVE mg/dL
Hgb urine dipstick: NEGATIVE
Ketones, ur: NEGATIVE mg/dL
Leukocytes,Ua: NEGATIVE
Nitrite: NEGATIVE
Protein, ur: NEGATIVE mg/dL
Specific Gravity, Urine: 1.006 (ref 1.005–1.030)
pH: 6 (ref 5.0–8.0)

## 2022-04-29 NOTE — ED Notes (Signed)
Breakfast tray ordered at this time.

## 2022-04-29 NOTE — Evaluation (Signed)
Physical Therapy Evaluation and Discharge Patient Details Name: [name] MRN: [id number] DOB: 19-May-1926 Today's Date: 04/29/2022  History of Present Illness  89 y.o. male brought in by EMS from home  04/28/22 after fall with no loss of consciousness. Normally walks with RW; lost balance backwards and hit head. +laceration; CT head there was evidence of chronic subdural hygroma slightly increased in size with similar midline shift to prior CT; Neurosurgery not planning intervention.  PMH of HTN, HLD, CAD on Plavix and aspirin, CKD, third-degree AV block status post pacemaker  Clinical Impression   Patient evaluated by Physical Therapy with no further acute PT needs identified. Patient lives alone with family living beside him and checking on him every morning. Patient reports recent stay in SNF for 5-6 weeks and does not want to return (although states he will if necessary). Patient able to complete all mobility with no physical assist--required supervision with ambulation due to low vision (to avoid obstacle x 1). Patient reports he does not run into things at home because he knows where everything is. Feel patient is mobilizing well enough to return home with intermittent supervision from family (as PTA). PT is signing off. Thank you for this referral.        Recommendations for follow up therapy are one component of a multi-disciplinary discharge planning process, led by the attending physician.  Recommendations may be updated based on patient status, additional functional criteria and insurance authorization.  Follow Up Recommendations Home health PT      Assistance Recommended at Discharge Set up Supervision/Assistance  Patient can return home with the following  Assistance with cooking/housework;Direct supervision/assist for medications management;Direct supervision/assist for financial management;Assist for transportation;Help with stairs or ramp for entrance    Equipment  Recommendations None recommended by PT  Recommendations for Other Services       Functional Status Assessment Patient has had a recent decline in their functional status and demonstrates the ability to make significant improvements in function in a reasonable and predictable amount of time.     Precautions / Restrictions Precautions Precautions: Fall Precaution Comments: pt reports 2 recent falls Restrictions Weight Bearing Restrictions: No      Mobility  Bed Mobility Overal bed mobility: Needs Assistance Bed Mobility: Supine to Sit, Sit to Supine     Supine to sit: Min guard Sit to supine: Min guard   General bed mobility comments: Increased time and effort; hands on guarding as pt on higher surface of stretcher, but no physical assist needed. Pt reports bed mobility is effortful at home as well.    Transfers Overall transfer level: Needs assistance Equipment used: Rolling walker (2 wheels) Transfers: Sit to/from Stand Sit to Stand: Min guard           General transfer comment: Min guard A for safety    Ambulation/Gait Ambulation/Gait assistance: Min guard, Supervision Gait Distance (Feet): 90 Feet Assistive device: Rolling walker (2 wheels) Gait Pattern/deviations: Step-through pattern, Decreased stride length, Trunk flexed   Gait velocity interpretation: 1.31 - 2.62 ft/sec, indicative of limited community ambulator   General Gait Details: pt accustomed to rollator and tends to push RW too far ahead (corrects with cuing but does not maintain). Patient with no imbalance. Patient requires supervision due to his low vision and in an unfamiliar environment. Expresses he does not have issues with running into things at home because he knows where everything is.  Stairs            Wheelchair  Mobility    Modified Rankin (Stroke Patients Only)       Balance Overall balance assessment: Needs assistance Sitting-balance support: No upper extremity supported,  Feet unsupported Sitting balance-Leahy Scale: Good Sitting balance - Comments: Able to maintain static sitting danging from edge of stretcher.   Standing balance support: Bilateral upper extremity supported, During functional activity Standing balance-Leahy Scale: Poor Standing balance comment: reliant on RW                             Pertinent Vitals/Pain Pain Assessment Pain Assessment: Faces Faces Pain Scale: Hurts little more Pain Location: "sore everywhere" Pain Descriptors / Indicators: Sore, Aching Pain Intervention(s): Limited activity within patient's tolerance, Monitored during session    Home Living Family/patient expects to be discharged to:: Private residence Living Arrangements: Alone Available Help at Discharge: Family Type of Home: House Home Access: Ramped entrance       Home Layout: Other (Comment);Able to live on main level with bedroom/bathroom (split level, but lives on main) Home Equipment: Rollator (4 wheels);BSC/3in1;Shower seat - built in;Grab bars - toilet Additional Comments: Pt reports that son and daughter in law live next to him and provide his meals and check on him every morning; also manages finances    Prior Function Prior Level of Function : Independent/Modified Independent;History of Falls (last six months) (2 recent falls)             Mobility Comments: uses a rollator at all times ADLs Comments: pt performs ADL mod I, but requires assist for IADL     Hand Dominance   Dominant Hand: Right    Extremity/Trunk Assessment   Upper Extremity Assessment Upper Extremity Assessment: Defer to OT evaluation    Lower Extremity Assessment Lower Extremity Assessment: Generalized weakness    Cervical / Trunk Assessment Cervical / Trunk Assessment: Normal  Communication   Communication: HOH (L hearing aid)  Cognition Arousal/Alertness: Awake/alert Behavior During Therapy: WFL for tasks assessed/performed Overall Cognitive  Status: Impaired/Different from baseline Area of Impairment: Awareness, Problem solving                           Awareness: Anticipatory, Emergent Problem Solving: Slow processing General Comments: Pt with some delayed processing. Pt demonstrates good insight into things he should be doing differently at home (i.e. always wearing his personal alert system, and sitting when his balance feels off). Pt reporting he would like to go home, but with decresaed safety awareness in regard to whether he is moving at baseline function or not and feeling like he needs OT/PT help to decide whether he is safe at home.        General Comments General comments (skin integrity, edema, etc.): See vitals flowsheet for orthostatic BPs which were negative.    Exercises     Assessment/Plan    PT Assessment All further PT needs can be met in the next venue of care  PT Problem List Decreased strength;Decreased balance;Decreased knowledge of use of DME       PT Treatment Interventions      PT Goals (Current goals can be found in the Care Plan section)  Acute Rehab PT Goals Patient Stated Goal: return home PT Goal Formulation: All assessment and education complete, DC therapy    Frequency       Co-evaluation PT/OT/SLP Co-Evaluation/Treatment: Yes Reason for Co-Treatment: For patient/therapist safety;Other (comment) (due to pt on stretcher  in ED) PT goals addressed during session: Mobility/safety with mobility;Balance;Proper use of DME         AM-PAC PT "6 Clicks" Mobility  Outcome Measure Help needed turning from your back to your side while in a flat bed without using bedrails?: None Help needed moving from lying on your back to sitting on the side of a flat bed without using bedrails?: None Help needed moving to and from a bed to a chair (including a wheelchair)?: A Little Help needed standing up from a chair using your arms (e.g., wheelchair or bedside chair)?: A Little Help  needed to walk in hospital room?: A Little Help needed climbing 3-5 steps with a railing? : A Little 6 Click Score: 20    End of Session Equipment Utilized During Treatment: Gait belt Activity Tolerance: Patient tolerated treatment well Patient left: in bed;with call bell/phone within reach (ED stretcher) Nurse Communication: Mobility status;Other (comment) (ecommendation for HHPT) PT Visit Diagnosis: Unsteadiness on feet (R26.81);History of falling (Z91.81)    Time: [id number] PT Time Calculation (min) (ACUTE ONLY): 33 min   Charges:   PT Evaluation $PT Eval Low Complexity: [place], PT Acute Rehabilitation Services  Office [phone]   Rexanne Mano 04/29/2022, 9:16 AM

## 2022-04-29 NOTE — ED Notes (Addendum)
Discharge instructions discussed with son, including medication changes, wound care, and follow up. Family verbalized understanding.

## 2022-04-29 NOTE — ED Notes (Signed)
PT/OT at bedside.

## 2022-04-29 NOTE — ED Provider Notes (Signed)
Patient had been waiting ED for [facility] evaluation (previously cleared by EDP/NS for d/c as no tx recommended for SD hygroma).    TOC indicates they/PT recommend d/c to home, with addition of home health services.    Pts vitals normal, no distress or apparent discomfort. Asking for breakfast/food - provided.   Pt currently appears stable for d/c per earlier plan.      Lajean Saver, MD 04/29/22 [phone]

## 2022-04-29 NOTE — ED Notes (Signed)
Family informed that the patient is being discharged. Son states he will be here to take the patient home.

## 2022-04-29 NOTE — ED Notes (Signed)
Patient resting quietly pending Ot/PT and TOC evaluations in morning

## 2022-04-29 NOTE — TOC Initial Note (Addendum)
Transition of Care [facility]) - Initial/Assessment Note    Patient Details  Name: [name] MRN: [id number] Date of Birth: February 14, 1927  [facility]) CM/SW Contact:    Verdell Carmine, RN Phone Number: 04/29/2022, 10:27 AM  Clinical Narrative:                    Patient presented to ED with fall. He has walker at home. He lives alone. PT OT assessment revealed Home Health recommendation .Paitent was previously with [facility] home health. Contacted Katina from [place]. She will be calling back shortly to give an answer on acceptance.  1200 Accepted from [place]. Spoke to son Legrand Como regarding home health services and private agencies for sitting services. He understands the process and will be assisting his father as well.   Patient Goals and CMS Choice            Expected Discharge Plan and Services                                   HH Arranged: PT, OT, Nurse's Aide, Social Work [employer] Agency: [employer], [place] Date [place]: 04/29/22 Time [place]: 1027 Representative spoke with at [place] Arrangements/Services                       Activities of Daily Living      Permission Sought/Granted                  Emotional Assessment              Admission diagnosis:  fall on thinners level 2 Patient Active Problem List   Diagnosis Date Noted   CAD (coronary artery disease)    Chronic systolic CHF (congestive heart failure) (HCC)    PAF (paroxysmal atrial fibrillation) ([place])    Pacemaker    Unspecified protein-calorie malnutrition ([place]) 01/31/2021   Periprosthetic fracture around internal prosthetic left hip joint ([place]) 01/24/2021   Prolonged QT interval 01/24/2021   Stage 3a chronic kidney disease (CKD) ([place]) 01/24/2021   Vitreous hemorrhage, left eye ([place]) 07/14/2019   Vitreous hemorrhage, left ([place]) 07/13/2019   Severe sepsis ([place])  05/16/2019   Biventricular cardiac pacemaker in situ 04/30/2019   Atrial fibrillation, permanent ([place]) 01/07/2016   Orthostatic hypotension 02/06/2015   Weakness 01/05/2015   History of GI bleed 01/05/2015   Hypokalemia 01/05/2015   Dyspnea 11/30/2014   Fatigue 02/22/2014   Coronary artery disease involving native coronary artery of native heart without angina pectoris    Prostate cancer ([place])    GI bleed 02/09/2014   Chest pain 02/09/2014   Malignant neoplasm of prostate ([place]) 12/15/2013   Other malaise and fatigue 11/16/2013   Chronic combined systolic and diastolic CHF (congestive heart failure) ([place]) 10/12/2013   Left bundle branch block 10/12/2013   Angina, class III ([place]) 09/05/2013   Bradycardia    Mitral valve disorder 07/10/2013   Cough 07/10/2013   Nodular prostate without urinary obstruction 01/08/2011   Hypertrophy of prostate without urinary obstruction and other lower urinary tract symptoms (LUTS) 01/08/2011   Impotence of organic origin 01/08/2011   PCP:  Lawerance Cruel, MD Pharmacy:   [facility], [place] - [address] [place] [address]  [address] [place] [address] Phone: [phone] Fax: [phone]     Social Determinants of Health (SDOH) Social History: SDOH Screenings   Tobacco Use: Medium Risk (04/28/2022)   SDOH Interventions:     Readmission Risk Interventions     No data to display

## 2022-04-29 NOTE — Evaluation (Signed)
Occupational Therapy Evaluation Patient Details Name: [name] MRN: [id number] DOB: 1926-10-16 Today's Date: 04/29/2022   History of Present Illness 89 y.o. male brought in by EMS from home  04/28/22 after fall with no loss of consciousness. Normally walks with RW; lost balance backwards and hit head. +laceration; CT head there was evidence of chronic subdural hygroma slightly increased in size with similar midline shift to prior CT; Neurosurgery not planning intervention.  PMH of HTN, HLD, CAD on Plavix and aspirin, CKD, third-degree AV block status post pacemaker   Clinical Impression   PTA, pt lived alone and performed ADL with mod I; family nearby assisted with all ADL. Upon eval, pt presents with decreased balance, strength, activity tolerance, safety, and low vision (baseline). Pt performing UB ADL with set-up and LB ADL with min guard A. Pt with decreased knowledge of compensatory strategies for low vision, thus increasing fall risk at home. Recommending home health OT to optimize safety and independence in ADL and IADL.      Recommendations for follow up therapy are one component of a multi-disciplinary discharge planning process, led by the attending physician.  Recommendations may be updated based on patient status, additional functional criteria and insurance authorization.   Follow Up Recommendations  Home health OT     Assistance Recommended at Discharge Intermittent Supervision/Assistance  Patient can return home with the following A little help with walking and/or transfers;A little help with bathing/dressing/bathroom;Assistance with cooking/housework;Assist for transportation;Help with stairs or ramp for entrance;Direct supervision/assist for medications management;Direct supervision/assist for financial management    Functional Status Assessment  Patient has had a recent decline in their functional status and demonstrates the ability to make significant improvements in  function in a reasonable and predictable amount of time.  Equipment Recommendations  None recommended by OT    Recommendations for Other Services       Precautions / Restrictions Precautions Precautions: Fall Restrictions Weight Bearing Restrictions: No      Mobility Bed Mobility Overal bed mobility: Needs Assistance Bed Mobility: Supine to Sit, Sit to Supine     Supine to sit: Min guard Sit to supine: Min guard   General bed mobility comments: Increased time and effort; hands on guarding as pt on higher surface of stretcher, but no physical assist needed. Pt reports bed mobility is effortful at home as well.    Transfers Overall transfer level: Needs assistance Equipment used: Rolling walker (2 wheels) Transfers: Sit to/from Stand Sit to Stand: Min guard           General transfer comment: Min guard A for safety      Balance Overall balance assessment: Needs assistance Sitting-balance support: No upper extremity supported, Feet unsupported Sitting balance-Leahy Scale: Good Sitting balance - Comments: Able to maintain static sitting danging from edge of stretcher.   Standing balance support: Bilateral upper extremity supported, During functional activity Standing balance-Leahy Scale: Poor Standing balance comment: reliant on RW                           ADL either performed or assessed with clinical judgement   ADL Overall ADL's : Needs assistance/impaired Eating/Feeding: Modified independent;Sitting   Grooming: Min guard;Standing   Upper Body Bathing: Set up;Sitting   Lower Body Bathing: Min guard;Sit to/from stand   Upper Body Dressing : Set up;Sitting   Lower Body Dressing: Min guard;Sit to/from stand   Toilet Transfer: Min guard;Rolling walker (2 wheels);Ambulation;Comfort height toilet Toilet  Transfer Details (indicate cue type and reason): simulated in room Toileting- Clothing Manipulation and Hygiene: Modified  independent;Sitting/lateral lean   Tub/ Shower Transfer: Walk-in shower;Min guard;Ambulation;Shower seat;Rolling walker (2 wheels)   Functional mobility during ADLs: Min guard;Rolling walker (2 wheels) General ADL Comments: Pt uses rollator at baseline     Vision Ability to See in Adequate Light: 2 Moderately impaired Patient Visual Report: Diplopia;No change from baseline Vision Assessment?: Vision impaired- to be further tested in functional context Additional Comments: Pt nearly bumping into one object in hall, reporting he can see larger objects, but difficulty reading because all of the words seem to run together. Reporting no new changes in vision. Reports diplopia for 5-6 years and observed to compensate frequently by closing R eye. Pt with good insight that decreased vision can increase fall risk as well as present additional challenges to avoid bumping into obstacles.     Perception Perception Perception Tested?: No   Praxis Praxis Praxis tested?: Not tested    Pertinent Vitals/Pain Pain Assessment Pain Assessment: Faces Faces Pain Scale: Hurts little more Pain Location: "sore everywhere" Pain Descriptors / Indicators: Sore, Aching Pain Intervention(s): Limited activity within patient's tolerance, Monitored during session     Hand Dominance Right   Extremity/Trunk Assessment Upper Extremity Assessment Upper Extremity Assessment: Generalized weakness   Lower Extremity Assessment Lower Extremity Assessment: Defer to PT evaluation       Communication Communication Communication: HOH (L hearing aid)   Cognition Arousal/Alertness: Awake/alert Behavior During Therapy: WFL for tasks assessed/performed Overall Cognitive Status: Impaired/Different from baseline Area of Impairment: Awareness, Problem solving                           Awareness: Anticipatory, Emergent Problem Solving: Slow processing General Comments: Pt with some delayed processing and one  instance of production of speech that was difficult to understand. Pt demonstrates good insight into things he should be doing differently at home (i.e. always wearing his personal alert system, and sitting when his balance feels off). Pt reporting he would like to go home, but with decresaed safety awareness in regard to whether he is moving at baseline function or not and feeling like he needs OT/PT help to decide whether he is safe at home.     General Comments  See PT note for BPs    Exercises     Shoulder Instructions      Home Living Family/patient expects to be discharged to:: Private residence Living Arrangements: Alone Available Help at Discharge: Family Type of Home: House Home Access: [place]: Other (Comment);Able to live on main level with bedroom/bathroom (split level, but lives on main)     Bathroom Shower/Tub: Walk-in shower (walk in tub bath with door and built in seat)   Biochemist, clinical: [place]: Rollator (4 wheels);BSC/3in1;Shower seat - built in;Grab bars - toilet   Additional Comments: Pt reports that son and daughter in law live next to him and provide his meals and check on him every morning; also manages finances      Prior Functioning/Environment Prior Level of Function : Independent/Modified Independent;History of Falls (last six months) (2 recent falls)             Mobility Comments: rollator ADLs Comments: pt performs ADL mod I, but requires assist for IADL        OT Problem List: Decreased strength;Decreased activity tolerance;Impaired balance (sitting  and/or standing);Decreased safety awareness;Decreased knowledge of use of DME or AE;Pain;Impaired vision/perception      OT Treatment/Interventions: Self-care/ADL training;Therapeutic exercise;DME and/or AE instruction;Therapeutic activities;Patient/family education;Balance training;Visual/perceptual remediation/compensation    OT Goals(Current  goals can be found in the care plan section) Acute Rehab OT Goals Patient Stated Goal: Get therapy at home OT Goal Formulation: With patient Time For Goal Achievement: 05/13/22 Potential to Achieve Goals: Good  OT Frequency: Min 2X/week    Co-evaluation              AM-PAC OT "6 Clicks" Daily Activity     Outcome Measure Help from another person eating meals?: None Help from another person taking care of personal grooming?: A Little Help from another person toileting, which includes using toliet, bedpan, or urinal?: A Little Help from another person bathing (including washing, rinsing, drying)?: A Little Help from another person to put on and taking off regular upper body clothing?: A Little Help from another person to put on and taking off regular lower body clothing?: A Little 6 Click Score: 19   End of Session Equipment Utilized During Treatment: Gait belt;Rolling walker (2 wheels) Nurse Communication: Mobility status  Activity Tolerance: Patient tolerated treatment well Patient left: in bed;with call bell/phone within reach;with nursing/sitter in room (Nurse entering room)  OT Visit Diagnosis: Unsteadiness on feet (R26.81);Muscle weakness (generalized) (M62.81);History of falling (Z91.81);Other symptoms and signs involving cognitive function;Pain;Low vision, both eyes (H54.2) Pain - part of body:  (generalized)                Time: [id number] OT Time Calculation (min): 31 min Charges:  OT General Charges $OT Visit: 1 Visit OT Evaluation $OT Eval Low Complexity: 1 Low  Elder Cyphers, OTR/L [facility] Acute Rehabilitation Office: [phone]   Magnus Ivan 04/29/2022, 8:23 AM

## 2022-04-30 ENCOUNTER — Ambulatory Visit: Payer: Medicare HMO | Attending: Internal Medicine

## 2022-04-30 DIAGNOSIS — I5022 Chronic systolic (congestive) heart failure: Secondary | ICD-10-CM | POA: Diagnosis not present

## 2022-04-30 DIAGNOSIS — Z950 Presence of cardiac pacemaker: Secondary | ICD-10-CM

## 2022-05-01 NOTE — Progress Notes (Signed)
Remote pacemaker transmission.   

## 2022-05-02 ENCOUNTER — Telehealth: Payer: Self-pay

## 2022-05-02 DIAGNOSIS — F332 Major depressive disorder, recurrent severe without psychotic features: Secondary | ICD-10-CM | POA: Diagnosis not present

## 2022-05-02 DIAGNOSIS — I10 Essential (primary) hypertension: Secondary | ICD-10-CM | POA: Diagnosis not present

## 2022-05-02 DIAGNOSIS — I5020 Unspecified systolic (congestive) heart failure: Secondary | ICD-10-CM | POA: Diagnosis not present

## 2022-05-02 DIAGNOSIS — E7800 Pure hypercholesterolemia, unspecified: Secondary | ICD-10-CM | POA: Diagnosis not present

## 2022-05-02 DIAGNOSIS — K219 Gastro-esophageal reflux disease without esophagitis: Secondary | ICD-10-CM | POA: Diagnosis not present

## 2022-05-02 DIAGNOSIS — I4891 Unspecified atrial fibrillation: Secondary | ICD-10-CM | POA: Diagnosis not present

## 2022-05-02 DIAGNOSIS — N400 Enlarged prostate without lower urinary tract symptoms: Secondary | ICD-10-CM | POA: Diagnosis not present

## 2022-05-02 DIAGNOSIS — N183 Chronic kidney disease, stage 3 unspecified: Secondary | ICD-10-CM | POA: Diagnosis not present

## 2022-05-02 DIAGNOSIS — I2510 Atherosclerotic heart disease of native coronary artery without angina pectoris: Secondary | ICD-10-CM | POA: Diagnosis not present

## 2022-05-02 NOTE — Progress Notes (Unsigned)
EPIC Encounter for ICM Monitoring  Patient Name: [name] is a 89 y.o. male Date: 05/02/2022 Primary Care Physican: Lawerance Cruel, MD Primary Cardiologist: Irish Lack Electrophysiologist: Lovena Le  BiV Pacing: 87% 07/10/2021 Office Weight: 181 lbs 12/15/2021 Weight: 180 lbs 02/20/2022 Weight: 195 lbs     Attempted call to patient and unable to reach.  Left detailed message per DPR regarding transmission. Transmission reviewed.   ED visit 3/2   CorVue thoracic impedance suggesting possible dryness starting 2/28 and starting to trend back toward baseline.  Possible fluid accumulation 2/16-2/26.   Prescribed:  Torsemide 20 mg Take 1 tablet (20 mg total) by mouth daily.   Labs: 01/23/2021 Creatinine 1.10, BUN 15, Potassium 4.0, Sodium 136 A complete set of results can be found in Results Review.   Recommendations:   Left voice mail with ICM number and encouraged to call if experiencing any fluid symptoms.    Follow-up plan: ICM clinic phone appointment on 05/07/2022 to recheck fluid levels.  91 day device clinic remote transmission 07/04/2022.     EP/Cardiology Office Visits:   Recall 07/05/2022 with Dr. Irish Lack.  Recall 10/15/2022 with [name].    Copy of ICM check sent to Dr. Lovena Le.    Direct Trend View thorugh 05/02/2022:   3 month ICM trend: 04/30/2022.    12-14 Month ICM trend:     Rosalene Billings, RN 05/02/2022 3:57 PM

## 2022-05-02 NOTE — Telephone Encounter (Signed)
Remote ICM transmission received.  Attempted call to patient regarding ICM remote transmission and left message to return call   

## 2022-05-03 NOTE — Progress Notes (Signed)
Spoke with patient and heart failure questions reviewed.  Transmission results reviewed.  Pt asymptomatic for fluid accumulation but did have an ER visit for a fall.  He lost his balance at home.  He is generally feeling poorly but nothing specific at this time.  He sent an updated Corvue report showing fluid levels returned to normal.  Discussed drinking approximately 64 oz fluid daily to stay hydrated.  No changes and encouraged to call if experiencing any fluid symptoms.

## 2022-05-07 ENCOUNTER — Telehealth: Payer: Self-pay

## 2022-05-07 DIAGNOSIS — R55 Syncope and collapse: Secondary | ICD-10-CM | POA: Diagnosis not present

## 2022-05-07 DIAGNOSIS — I442 Atrioventricular block, complete: Secondary | ICD-10-CM | POA: Diagnosis not present

## 2022-05-07 DIAGNOSIS — G9608 Other cranial cerebrospinal fluid leak: Secondary | ICD-10-CM | POA: Diagnosis not present

## 2022-05-07 DIAGNOSIS — I129 Hypertensive chronic kidney disease with stage 1 through stage 4 chronic kidney disease, or unspecified chronic kidney disease: Secondary | ICD-10-CM | POA: Diagnosis not present

## 2022-05-07 DIAGNOSIS — N401 Enlarged prostate with lower urinary tract symptoms: Secondary | ICD-10-CM | POA: Diagnosis not present

## 2022-05-07 DIAGNOSIS — Z09 Encounter for follow-up examination after completed treatment for conditions other than malignant neoplasm: Secondary | ICD-10-CM | POA: Diagnosis not present

## 2022-05-07 DIAGNOSIS — R42 Dizziness and giddiness: Secondary | ICD-10-CM | POA: Diagnosis not present

## 2022-05-07 DIAGNOSIS — Z6828 Body mass index (BMI) 28.0-28.9, adult: Secondary | ICD-10-CM | POA: Diagnosis not present

## 2022-05-07 DIAGNOSIS — N189 Chronic kidney disease, unspecified: Secondary | ICD-10-CM | POA: Diagnosis not present

## 2022-05-07 DIAGNOSIS — J329 Chronic sinusitis, unspecified: Secondary | ICD-10-CM | POA: Diagnosis not present

## 2022-05-07 DIAGNOSIS — S0101XD Laceration without foreign body of scalp, subsequent encounter: Secondary | ICD-10-CM | POA: Diagnosis not present

## 2022-05-07 DIAGNOSIS — I2510 Atherosclerotic heart disease of native coronary artery without angina pectoris: Secondary | ICD-10-CM | POA: Diagnosis not present

## 2022-05-07 DIAGNOSIS — I4891 Unspecified atrial fibrillation: Secondary | ICD-10-CM | POA: Diagnosis not present

## 2022-05-07 DIAGNOSIS — R2689 Other abnormalities of gait and mobility: Secondary | ICD-10-CM | POA: Diagnosis not present

## 2022-05-07 DIAGNOSIS — Z08 Encounter for follow-up examination after completed treatment for malignant neoplasm: Secondary | ICD-10-CM | POA: Diagnosis not present

## 2022-05-07 DIAGNOSIS — F32A Depression, unspecified: Secondary | ICD-10-CM | POA: Diagnosis not present

## 2022-05-07 DIAGNOSIS — Z8546 Personal history of malignant neoplasm of prostate: Secondary | ICD-10-CM | POA: Diagnosis not present

## 2022-05-07 DIAGNOSIS — M4726 Other spondylosis with radiculopathy, lumbar region: Secondary | ICD-10-CM | POA: Diagnosis not present

## 2022-05-07 NOTE — Telephone Encounter (Signed)
        Patient  visited [facility] on 04/29/2022  for fall.   Telephone encounter attempt :  1st  A HIPAA compliant voice message was left requesting a return call.  Instructed patient to call back at [phone].   [place] Resource Care Guide   [email or web address]  ?? [phone]   Website: [email or web address]  [email or web address]

## 2022-05-10 ENCOUNTER — Telehealth: Payer: Self-pay

## 2022-05-10 NOTE — Telephone Encounter (Signed)
     Patient  visit on 04/29/2022  at [facility] was for fall.  Have you been able to follow up with your primary care physician? Yes  The patient was or was not able to obtain any needed medicine or equipment. No medication prescribed.  Are there diet recommendations that you are having difficulty following? No  Patient expresses understanding of discharge instructions and education provided has no other needs at this time. Yes  Verified patient's home address to send SCAT application per patient request.   [place] Guide   [email or web address]  ?? [phone]   Website: [email or web address]  [email or web address]

## 2022-05-18 DIAGNOSIS — I4891 Unspecified atrial fibrillation: Secondary | ICD-10-CM | POA: Diagnosis not present

## 2022-05-18 DIAGNOSIS — I442 Atrioventricular block, complete: Secondary | ICD-10-CM | POA: Diagnosis not present

## 2022-05-18 DIAGNOSIS — I2510 Atherosclerotic heart disease of native coronary artery without angina pectoris: Secondary | ICD-10-CM | POA: Diagnosis not present

## 2022-05-18 DIAGNOSIS — S0101XD Laceration without foreign body of scalp, subsequent encounter: Secondary | ICD-10-CM | POA: Diagnosis not present

## 2022-05-18 DIAGNOSIS — I129 Hypertensive chronic kidney disease with stage 1 through stage 4 chronic kidney disease, or unspecified chronic kidney disease: Secondary | ICD-10-CM | POA: Diagnosis not present

## 2022-05-18 DIAGNOSIS — N401 Enlarged prostate with lower urinary tract symptoms: Secondary | ICD-10-CM | POA: Diagnosis not present

## 2022-05-18 DIAGNOSIS — M4726 Other spondylosis with radiculopathy, lumbar region: Secondary | ICD-10-CM | POA: Diagnosis not present

## 2022-05-18 DIAGNOSIS — N189 Chronic kidney disease, unspecified: Secondary | ICD-10-CM | POA: Diagnosis not present

## 2022-05-18 DIAGNOSIS — F32A Depression, unspecified: Secondary | ICD-10-CM | POA: Diagnosis not present

## 2022-05-22 DIAGNOSIS — I129 Hypertensive chronic kidney disease with stage 1 through stage 4 chronic kidney disease, or unspecified chronic kidney disease: Secondary | ICD-10-CM | POA: Diagnosis not present

## 2022-05-22 DIAGNOSIS — I442 Atrioventricular block, complete: Secondary | ICD-10-CM | POA: Diagnosis not present

## 2022-05-22 DIAGNOSIS — I2510 Atherosclerotic heart disease of native coronary artery without angina pectoris: Secondary | ICD-10-CM | POA: Diagnosis not present

## 2022-05-22 DIAGNOSIS — F32A Depression, unspecified: Secondary | ICD-10-CM | POA: Diagnosis not present

## 2022-05-22 DIAGNOSIS — I4891 Unspecified atrial fibrillation: Secondary | ICD-10-CM | POA: Diagnosis not present

## 2022-05-22 DIAGNOSIS — M4726 Other spondylosis with radiculopathy, lumbar region: Secondary | ICD-10-CM | POA: Diagnosis not present

## 2022-05-22 DIAGNOSIS — S0101XD Laceration without foreign body of scalp, subsequent encounter: Secondary | ICD-10-CM | POA: Diagnosis not present

## 2022-05-22 DIAGNOSIS — N189 Chronic kidney disease, unspecified: Secondary | ICD-10-CM | POA: Diagnosis not present

## 2022-05-22 DIAGNOSIS — N401 Enlarged prostate with lower urinary tract symptoms: Secondary | ICD-10-CM | POA: Diagnosis not present

## 2022-05-25 DIAGNOSIS — I2510 Atherosclerotic heart disease of native coronary artery without angina pectoris: Secondary | ICD-10-CM | POA: Diagnosis not present

## 2022-05-25 DIAGNOSIS — I129 Hypertensive chronic kidney disease with stage 1 through stage 4 chronic kidney disease, or unspecified chronic kidney disease: Secondary | ICD-10-CM | POA: Diagnosis not present

## 2022-05-25 DIAGNOSIS — I442 Atrioventricular block, complete: Secondary | ICD-10-CM | POA: Diagnosis not present

## 2022-05-25 DIAGNOSIS — I4891 Unspecified atrial fibrillation: Secondary | ICD-10-CM | POA: Diagnosis not present

## 2022-05-25 DIAGNOSIS — N189 Chronic kidney disease, unspecified: Secondary | ICD-10-CM | POA: Diagnosis not present

## 2022-05-25 DIAGNOSIS — S0101XD Laceration without foreign body of scalp, subsequent encounter: Secondary | ICD-10-CM | POA: Diagnosis not present

## 2022-05-25 DIAGNOSIS — N401 Enlarged prostate with lower urinary tract symptoms: Secondary | ICD-10-CM | POA: Diagnosis not present

## 2022-05-25 DIAGNOSIS — M4726 Other spondylosis with radiculopathy, lumbar region: Secondary | ICD-10-CM | POA: Diagnosis not present

## 2022-05-25 DIAGNOSIS — F32A Depression, unspecified: Secondary | ICD-10-CM | POA: Diagnosis not present

## 2022-05-29 DIAGNOSIS — M4726 Other spondylosis with radiculopathy, lumbar region: Secondary | ICD-10-CM | POA: Diagnosis not present

## 2022-05-29 DIAGNOSIS — N189 Chronic kidney disease, unspecified: Secondary | ICD-10-CM | POA: Diagnosis not present

## 2022-05-29 DIAGNOSIS — I4891 Unspecified atrial fibrillation: Secondary | ICD-10-CM | POA: Diagnosis not present

## 2022-05-29 DIAGNOSIS — F32A Depression, unspecified: Secondary | ICD-10-CM | POA: Diagnosis not present

## 2022-05-29 DIAGNOSIS — S0101XD Laceration without foreign body of scalp, subsequent encounter: Secondary | ICD-10-CM | POA: Diagnosis not present

## 2022-05-29 DIAGNOSIS — N401 Enlarged prostate with lower urinary tract symptoms: Secondary | ICD-10-CM | POA: Diagnosis not present

## 2022-05-29 DIAGNOSIS — I442 Atrioventricular block, complete: Secondary | ICD-10-CM | POA: Diagnosis not present

## 2022-05-29 DIAGNOSIS — I129 Hypertensive chronic kidney disease with stage 1 through stage 4 chronic kidney disease, or unspecified chronic kidney disease: Secondary | ICD-10-CM | POA: Diagnosis not present

## 2022-05-29 DIAGNOSIS — I2510 Atherosclerotic heart disease of native coronary artery without angina pectoris: Secondary | ICD-10-CM | POA: Diagnosis not present

## 2022-05-31 DIAGNOSIS — N401 Enlarged prostate with lower urinary tract symptoms: Secondary | ICD-10-CM | POA: Diagnosis not present

## 2022-05-31 DIAGNOSIS — M4726 Other spondylosis with radiculopathy, lumbar region: Secondary | ICD-10-CM | POA: Diagnosis not present

## 2022-05-31 DIAGNOSIS — I129 Hypertensive chronic kidney disease with stage 1 through stage 4 chronic kidney disease, or unspecified chronic kidney disease: Secondary | ICD-10-CM | POA: Diagnosis not present

## 2022-05-31 DIAGNOSIS — I4891 Unspecified atrial fibrillation: Secondary | ICD-10-CM | POA: Diagnosis not present

## 2022-05-31 DIAGNOSIS — S0101XD Laceration without foreign body of scalp, subsequent encounter: Secondary | ICD-10-CM | POA: Diagnosis not present

## 2022-05-31 DIAGNOSIS — F32A Depression, unspecified: Secondary | ICD-10-CM | POA: Diagnosis not present

## 2022-05-31 DIAGNOSIS — I2510 Atherosclerotic heart disease of native coronary artery without angina pectoris: Secondary | ICD-10-CM | POA: Diagnosis not present

## 2022-05-31 DIAGNOSIS — N189 Chronic kidney disease, unspecified: Secondary | ICD-10-CM | POA: Diagnosis not present

## 2022-05-31 DIAGNOSIS — I442 Atrioventricular block, complete: Secondary | ICD-10-CM | POA: Diagnosis not present

## 2022-06-04 ENCOUNTER — Ambulatory Visit: Payer: Medicare HMO | Attending: Internal Medicine

## 2022-06-04 DIAGNOSIS — I5022 Chronic systolic (congestive) heart failure: Secondary | ICD-10-CM

## 2022-06-04 DIAGNOSIS — Z950 Presence of cardiac pacemaker: Secondary | ICD-10-CM | POA: Diagnosis not present

## 2022-06-05 DIAGNOSIS — F32A Depression, unspecified: Secondary | ICD-10-CM | POA: Diagnosis not present

## 2022-06-05 DIAGNOSIS — N189 Chronic kidney disease, unspecified: Secondary | ICD-10-CM | POA: Diagnosis not present

## 2022-06-05 DIAGNOSIS — S0101XD Laceration without foreign body of scalp, subsequent encounter: Secondary | ICD-10-CM | POA: Diagnosis not present

## 2022-06-05 DIAGNOSIS — N401 Enlarged prostate with lower urinary tract symptoms: Secondary | ICD-10-CM | POA: Diagnosis not present

## 2022-06-05 DIAGNOSIS — I442 Atrioventricular block, complete: Secondary | ICD-10-CM | POA: Diagnosis not present

## 2022-06-05 DIAGNOSIS — I2510 Atherosclerotic heart disease of native coronary artery without angina pectoris: Secondary | ICD-10-CM | POA: Diagnosis not present

## 2022-06-05 DIAGNOSIS — I129 Hypertensive chronic kidney disease with stage 1 through stage 4 chronic kidney disease, or unspecified chronic kidney disease: Secondary | ICD-10-CM | POA: Diagnosis not present

## 2022-06-05 DIAGNOSIS — M4726 Other spondylosis with radiculopathy, lumbar region: Secondary | ICD-10-CM | POA: Diagnosis not present

## 2022-06-05 DIAGNOSIS — I4891 Unspecified atrial fibrillation: Secondary | ICD-10-CM | POA: Diagnosis not present

## 2022-06-06 ENCOUNTER — Telehealth: Payer: Self-pay

## 2022-06-06 DIAGNOSIS — I129 Hypertensive chronic kidney disease with stage 1 through stage 4 chronic kidney disease, or unspecified chronic kidney disease: Secondary | ICD-10-CM | POA: Diagnosis not present

## 2022-06-06 DIAGNOSIS — N189 Chronic kidney disease, unspecified: Secondary | ICD-10-CM | POA: Diagnosis not present

## 2022-06-06 DIAGNOSIS — S0101XD Laceration without foreign body of scalp, subsequent encounter: Secondary | ICD-10-CM | POA: Diagnosis not present

## 2022-06-06 DIAGNOSIS — I2510 Atherosclerotic heart disease of native coronary artery without angina pectoris: Secondary | ICD-10-CM | POA: Diagnosis not present

## 2022-06-06 DIAGNOSIS — N401 Enlarged prostate with lower urinary tract symptoms: Secondary | ICD-10-CM | POA: Diagnosis not present

## 2022-06-06 DIAGNOSIS — F32A Depression, unspecified: Secondary | ICD-10-CM | POA: Diagnosis not present

## 2022-06-06 DIAGNOSIS — M4726 Other spondylosis with radiculopathy, lumbar region: Secondary | ICD-10-CM | POA: Diagnosis not present

## 2022-06-06 DIAGNOSIS — I442 Atrioventricular block, complete: Secondary | ICD-10-CM | POA: Diagnosis not present

## 2022-06-06 DIAGNOSIS — I4891 Unspecified atrial fibrillation: Secondary | ICD-10-CM | POA: Diagnosis not present

## 2022-06-06 NOTE — Progress Notes (Signed)
EPIC Encounter for ICM Monitoring  Patient Name: [name] is a 89 y.o. male Date: 06/06/2022 Primary Care Physican: Daisy Floro, MD Primary Cardiologist: Eldridge Dace Electrophysiologist: Ladona Ridgel  BiV Pacing: 86% 07/10/2021 Office Weight: 181 lbs 12/15/2021 Weight: 180 lbs 02/20/2022 Weight: 195 lbs     Attempted call to patient and unable to reach.  Left detailed message per DPR regarding transmission. Transmission reviewed.      CorVue thoracic impedance normal but was suggesting possible possible fluid accumulation 3/22-3/29.   Prescribed:  Torsemide 20 mg Take 1 tablet (20 mg total) by mouth daily.   Labs: 01/23/2021 Creatinine 1.10, BUN 15, Potassium 4.0, Sodium 136 A complete set of results can be found in Results Review.   Recommendations:   Left voice mail with ICM number and encouraged to call if experiencing any fluid symptoms.    Follow-up plan: ICM clinic phone appointment on 07/09/2022 to recheck fluid levels.  91 day device clinic remote transmission 07/04/2022.     EP/Cardiology Office Visits:   Recall 07/05/2022 with Dr. Eldridge Dace.  Recall 10/15/2022 with Dr Ladona Ridgel.    Copy of ICM check sent to Dr. Ladona Ridgel.    3 month ICM trend: 06/04/2022.    12-14 Month ICM trend:     Karie Soda, RN 06/06/2022 1:41 PM

## 2022-06-06 NOTE — Progress Notes (Signed)
Spoke with patient and heart failure questions reviewed.  Transmission results reviewed.  Pt asymptomatic for fluid accumulation.  He continues with PT to help strengthen his legs after his recent fall.  No changes and encouraged to call if experiencing any fluid symptoms.

## 2022-06-06 NOTE — Telephone Encounter (Signed)
Remote ICM transmission received.  Attempted call to patient regarding ICM remote transmission and left message to return call   

## 2022-06-13 DIAGNOSIS — I129 Hypertensive chronic kidney disease with stage 1 through stage 4 chronic kidney disease, or unspecified chronic kidney disease: Secondary | ICD-10-CM | POA: Diagnosis not present

## 2022-06-13 DIAGNOSIS — N189 Chronic kidney disease, unspecified: Secondary | ICD-10-CM | POA: Diagnosis not present

## 2022-06-13 DIAGNOSIS — S0101XD Laceration without foreign body of scalp, subsequent encounter: Secondary | ICD-10-CM | POA: Diagnosis not present

## 2022-06-13 DIAGNOSIS — N401 Enlarged prostate with lower urinary tract symptoms: Secondary | ICD-10-CM | POA: Diagnosis not present

## 2022-06-13 DIAGNOSIS — I4891 Unspecified atrial fibrillation: Secondary | ICD-10-CM | POA: Diagnosis not present

## 2022-06-13 DIAGNOSIS — F32A Depression, unspecified: Secondary | ICD-10-CM | POA: Diagnosis not present

## 2022-06-13 DIAGNOSIS — I2510 Atherosclerotic heart disease of native coronary artery without angina pectoris: Secondary | ICD-10-CM | POA: Diagnosis not present

## 2022-06-13 DIAGNOSIS — M4726 Other spondylosis with radiculopathy, lumbar region: Secondary | ICD-10-CM | POA: Diagnosis not present

## 2022-06-13 DIAGNOSIS — I442 Atrioventricular block, complete: Secondary | ICD-10-CM | POA: Diagnosis not present

## 2022-06-15 DIAGNOSIS — M4726 Other spondylosis with radiculopathy, lumbar region: Secondary | ICD-10-CM | POA: Diagnosis not present

## 2022-06-15 DIAGNOSIS — F32A Depression, unspecified: Secondary | ICD-10-CM | POA: Diagnosis not present

## 2022-06-15 DIAGNOSIS — S0101XD Laceration without foreign body of scalp, subsequent encounter: Secondary | ICD-10-CM | POA: Diagnosis not present

## 2022-06-15 DIAGNOSIS — I4891 Unspecified atrial fibrillation: Secondary | ICD-10-CM | POA: Diagnosis not present

## 2022-06-15 DIAGNOSIS — I442 Atrioventricular block, complete: Secondary | ICD-10-CM | POA: Diagnosis not present

## 2022-06-15 DIAGNOSIS — N401 Enlarged prostate with lower urinary tract symptoms: Secondary | ICD-10-CM | POA: Diagnosis not present

## 2022-06-15 DIAGNOSIS — I129 Hypertensive chronic kidney disease with stage 1 through stage 4 chronic kidney disease, or unspecified chronic kidney disease: Secondary | ICD-10-CM | POA: Diagnosis not present

## 2022-06-15 DIAGNOSIS — N189 Chronic kidney disease, unspecified: Secondary | ICD-10-CM | POA: Diagnosis not present

## 2022-06-15 DIAGNOSIS — I2510 Atherosclerotic heart disease of native coronary artery without angina pectoris: Secondary | ICD-10-CM | POA: Diagnosis not present

## 2022-06-18 DIAGNOSIS — I4891 Unspecified atrial fibrillation: Secondary | ICD-10-CM | POA: Diagnosis not present

## 2022-06-18 DIAGNOSIS — N189 Chronic kidney disease, unspecified: Secondary | ICD-10-CM | POA: Diagnosis not present

## 2022-06-18 DIAGNOSIS — I2510 Atherosclerotic heart disease of native coronary artery without angina pectoris: Secondary | ICD-10-CM | POA: Diagnosis not present

## 2022-06-18 DIAGNOSIS — F32A Depression, unspecified: Secondary | ICD-10-CM | POA: Diagnosis not present

## 2022-06-18 DIAGNOSIS — S0101XD Laceration without foreign body of scalp, subsequent encounter: Secondary | ICD-10-CM | POA: Diagnosis not present

## 2022-06-18 DIAGNOSIS — I129 Hypertensive chronic kidney disease with stage 1 through stage 4 chronic kidney disease, or unspecified chronic kidney disease: Secondary | ICD-10-CM | POA: Diagnosis not present

## 2022-06-18 DIAGNOSIS — M4726 Other spondylosis with radiculopathy, lumbar region: Secondary | ICD-10-CM | POA: Diagnosis not present

## 2022-06-18 DIAGNOSIS — N401 Enlarged prostate with lower urinary tract symptoms: Secondary | ICD-10-CM | POA: Diagnosis not present

## 2022-06-18 DIAGNOSIS — I442 Atrioventricular block, complete: Secondary | ICD-10-CM | POA: Diagnosis not present

## 2022-06-19 DIAGNOSIS — F32A Depression, unspecified: Secondary | ICD-10-CM | POA: Diagnosis not present

## 2022-06-19 DIAGNOSIS — S0101XD Laceration without foreign body of scalp, subsequent encounter: Secondary | ICD-10-CM | POA: Diagnosis not present

## 2022-06-19 DIAGNOSIS — N189 Chronic kidney disease, unspecified: Secondary | ICD-10-CM | POA: Diagnosis not present

## 2022-06-19 DIAGNOSIS — N401 Enlarged prostate with lower urinary tract symptoms: Secondary | ICD-10-CM | POA: Diagnosis not present

## 2022-06-19 DIAGNOSIS — I4891 Unspecified atrial fibrillation: Secondary | ICD-10-CM | POA: Diagnosis not present

## 2022-06-19 DIAGNOSIS — M4726 Other spondylosis with radiculopathy, lumbar region: Secondary | ICD-10-CM | POA: Diagnosis not present

## 2022-06-19 DIAGNOSIS — I129 Hypertensive chronic kidney disease with stage 1 through stage 4 chronic kidney disease, or unspecified chronic kidney disease: Secondary | ICD-10-CM | POA: Diagnosis not present

## 2022-06-19 DIAGNOSIS — I2510 Atherosclerotic heart disease of native coronary artery without angina pectoris: Secondary | ICD-10-CM | POA: Diagnosis not present

## 2022-06-19 DIAGNOSIS — I442 Atrioventricular block, complete: Secondary | ICD-10-CM | POA: Diagnosis not present

## 2022-06-22 DIAGNOSIS — F32A Depression, unspecified: Secondary | ICD-10-CM | POA: Diagnosis not present

## 2022-06-22 DIAGNOSIS — I442 Atrioventricular block, complete: Secondary | ICD-10-CM | POA: Diagnosis not present

## 2022-06-22 DIAGNOSIS — I2510 Atherosclerotic heart disease of native coronary artery without angina pectoris: Secondary | ICD-10-CM | POA: Diagnosis not present

## 2022-06-22 DIAGNOSIS — M4726 Other spondylosis with radiculopathy, lumbar region: Secondary | ICD-10-CM | POA: Diagnosis not present

## 2022-06-22 DIAGNOSIS — S0101XD Laceration without foreign body of scalp, subsequent encounter: Secondary | ICD-10-CM | POA: Diagnosis not present

## 2022-06-22 DIAGNOSIS — N189 Chronic kidney disease, unspecified: Secondary | ICD-10-CM | POA: Diagnosis not present

## 2022-06-22 DIAGNOSIS — N401 Enlarged prostate with lower urinary tract symptoms: Secondary | ICD-10-CM | POA: Diagnosis not present

## 2022-06-22 DIAGNOSIS — I4891 Unspecified atrial fibrillation: Secondary | ICD-10-CM | POA: Diagnosis not present

## 2022-06-22 DIAGNOSIS — I129 Hypertensive chronic kidney disease with stage 1 through stage 4 chronic kidney disease, or unspecified chronic kidney disease: Secondary | ICD-10-CM | POA: Diagnosis not present

## 2022-06-25 DIAGNOSIS — N189 Chronic kidney disease, unspecified: Secondary | ICD-10-CM | POA: Diagnosis not present

## 2022-06-25 DIAGNOSIS — I129 Hypertensive chronic kidney disease with stage 1 through stage 4 chronic kidney disease, or unspecified chronic kidney disease: Secondary | ICD-10-CM | POA: Diagnosis not present

## 2022-06-25 DIAGNOSIS — I4891 Unspecified atrial fibrillation: Secondary | ICD-10-CM | POA: Diagnosis not present

## 2022-06-25 DIAGNOSIS — M4726 Other spondylosis with radiculopathy, lumbar region: Secondary | ICD-10-CM | POA: Diagnosis not present

## 2022-06-25 DIAGNOSIS — F32A Depression, unspecified: Secondary | ICD-10-CM | POA: Diagnosis not present

## 2022-06-25 DIAGNOSIS — I2510 Atherosclerotic heart disease of native coronary artery without angina pectoris: Secondary | ICD-10-CM | POA: Diagnosis not present

## 2022-06-25 DIAGNOSIS — N401 Enlarged prostate with lower urinary tract symptoms: Secondary | ICD-10-CM | POA: Diagnosis not present

## 2022-06-25 DIAGNOSIS — S0101XD Laceration without foreign body of scalp, subsequent encounter: Secondary | ICD-10-CM | POA: Diagnosis not present

## 2022-06-25 DIAGNOSIS — I442 Atrioventricular block, complete: Secondary | ICD-10-CM | POA: Diagnosis not present

## 2022-07-02 DIAGNOSIS — R2689 Other abnormalities of gait and mobility: Secondary | ICD-10-CM | POA: Diagnosis not present

## 2022-07-02 DIAGNOSIS — Z9181 History of falling: Secondary | ICD-10-CM | POA: Diagnosis not present

## 2022-07-02 DIAGNOSIS — R1310 Dysphagia, unspecified: Secondary | ICD-10-CM | POA: Diagnosis not present

## 2022-07-02 DIAGNOSIS — G311 Senile degeneration of brain, not elsewhere classified: Secondary | ICD-10-CM | POA: Diagnosis not present

## 2022-07-02 DIAGNOSIS — R0981 Nasal congestion: Secondary | ICD-10-CM | POA: Diagnosis not present

## 2022-07-02 DIAGNOSIS — Z6829 Body mass index (BMI) 29.0-29.9, adult: Secondary | ICD-10-CM | POA: Diagnosis not present

## 2022-07-03 DIAGNOSIS — I4891 Unspecified atrial fibrillation: Secondary | ICD-10-CM | POA: Diagnosis not present

## 2022-07-03 DIAGNOSIS — F32A Depression, unspecified: Secondary | ICD-10-CM | POA: Diagnosis not present

## 2022-07-03 DIAGNOSIS — I129 Hypertensive chronic kidney disease with stage 1 through stage 4 chronic kidney disease, or unspecified chronic kidney disease: Secondary | ICD-10-CM | POA: Diagnosis not present

## 2022-07-03 DIAGNOSIS — I442 Atrioventricular block, complete: Secondary | ICD-10-CM | POA: Diagnosis not present

## 2022-07-03 DIAGNOSIS — N401 Enlarged prostate with lower urinary tract symptoms: Secondary | ICD-10-CM | POA: Diagnosis not present

## 2022-07-03 DIAGNOSIS — N189 Chronic kidney disease, unspecified: Secondary | ICD-10-CM | POA: Diagnosis not present

## 2022-07-03 DIAGNOSIS — S0101XD Laceration without foreign body of scalp, subsequent encounter: Secondary | ICD-10-CM | POA: Diagnosis not present

## 2022-07-03 DIAGNOSIS — I2510 Atherosclerotic heart disease of native coronary artery without angina pectoris: Secondary | ICD-10-CM | POA: Diagnosis not present

## 2022-07-03 DIAGNOSIS — M4726 Other spondylosis with radiculopathy, lumbar region: Secondary | ICD-10-CM | POA: Diagnosis not present

## 2022-07-04 ENCOUNTER — Ambulatory Visit (INDEPENDENT_AMBULATORY_CARE_PROVIDER_SITE_OTHER): Payer: Medicare HMO

## 2022-07-04 DIAGNOSIS — I442 Atrioventricular block, complete: Secondary | ICD-10-CM

## 2022-07-05 LAB — CUP PACEART REMOTE DEVICE CHECK
Battery Remaining Longevity: 14 mo
Battery Remaining Percentage: 15 %
Battery Voltage: 2.87 V
Date Time Interrogation Session: 20240508115612
Implantable Lead Connection Status: 753985
Implantable Lead Connection Status: 753985
Implantable Lead Connection Status: 753985
Implantable Lead Implant Date: 20161205
Implantable Lead Implant Date: 20161205
Implantable Lead Implant Date: 20161205
Implantable Lead Location: 753858
Implantable Lead Location: 753859
Implantable Lead Location: 753860
Implantable Pulse Generator Implant Date: 20161205
Lead Channel Impedance Value: 460 Ohm
Lead Channel Impedance Value: 960 Ohm
Lead Channel Pacing Threshold Amplitude: 0.75 V
Lead Channel Pacing Threshold Amplitude: 0.75 V
Lead Channel Pacing Threshold Pulse Width: 0.5 ms
Lead Channel Pacing Threshold Pulse Width: 0.8 ms
Lead Channel Sensing Intrinsic Amplitude: 12 mV
Lead Channel Setting Pacing Amplitude: 1.75 V
Lead Channel Setting Pacing Amplitude: 2.5 V
Lead Channel Setting Pacing Pulse Width: 0.5 ms
Lead Channel Setting Pacing Pulse Width: 0.8 ms
Lead Channel Setting Sensing Sensitivity: 2 mV
Pulse Gen Model: 3262
Pulse Gen Serial Number: 7802901

## 2022-07-09 ENCOUNTER — Ambulatory Visit: Payer: Medicare HMO | Attending: Internal Medicine

## 2022-07-09 DIAGNOSIS — Z950 Presence of cardiac pacemaker: Secondary | ICD-10-CM | POA: Diagnosis not present

## 2022-07-09 DIAGNOSIS — I5022 Chronic systolic (congestive) heart failure: Secondary | ICD-10-CM | POA: Diagnosis not present

## 2022-07-11 ENCOUNTER — Encounter (INDEPENDENT_AMBULATORY_CARE_PROVIDER_SITE_OTHER): Payer: Medicare HMO | Admitting: Ophthalmology

## 2022-07-11 DIAGNOSIS — I10 Essential (primary) hypertension: Secondary | ICD-10-CM | POA: Diagnosis not present

## 2022-07-11 DIAGNOSIS — H353221 Exudative age-related macular degeneration, left eye, with active choroidal neovascularization: Secondary | ICD-10-CM

## 2022-07-11 DIAGNOSIS — H43811 Vitreous degeneration, right eye: Secondary | ICD-10-CM | POA: Diagnosis not present

## 2022-07-11 DIAGNOSIS — H348321 Tributary (branch) retinal vein occlusion, left eye, with retinal neovascularization: Secondary | ICD-10-CM

## 2022-07-11 DIAGNOSIS — H35033 Hypertensive retinopathy, bilateral: Secondary | ICD-10-CM

## 2022-07-11 NOTE — Progress Notes (Signed)
EPIC Encounter for ICM Monitoring  Patient Name: [name] is a 89 y.o. male Date: 07/11/2022 Primary Care Physican: Daisy Floro, MD Primary Cardiologist: Eldridge Dace Electrophysiologist: Ladona Ridgel  BiV Pacing: 85% 07/10/2021 Office Weight: 181 lbs 12/15/2021 Weight: 180 lbs 02/20/2022 Weight: 195 lbs     Spoke with patient and heart failure questions reviewed.  Transmission results reviewed.  Pt asymptomatic for fluid accumulation.  He reports his has severe back pain.      CorVue thoracic impedance normal but was suggesting possible possible fluid accumulation 4/29-5/7.   Prescribed:  Torsemide 20 mg Take 1 tablet (20 mg total) by mouth daily.   Labs: 04/28/2022 Creatinine 1.21, BUN 19, Potassium 4.1, Sodium 133, GFR 56 A complete set of results can be found in Results Review.   Recommendations:   No changes and encouraged to call if experiencing any fluid symptoms.   Follow-up plan: ICM clinic phone appointment on 08/13/2022.  91 day device clinic remote transmission 10/13/2022.     EP/Cardiology Office Visits:   Advised to call Dr Hoyle Barr office to make appointment.  Recall 07/05/2022 with Dr. Eldridge Dace.  Recall 10/15/2022 with Dr Ladona Ridgel.    Copy of ICM check sent to Dr. Ladona Ridgel.     3 month ICM trend: 07/09/2022.    12-14 Month ICM trend:     Karie Soda, RN 07/11/2022 8:30 AM

## 2022-07-25 NOTE — Progress Notes (Signed)
Remote pacemaker transmission.   

## 2022-08-06 ENCOUNTER — Telehealth: Payer: Self-pay

## 2022-08-06 NOTE — Telephone Encounter (Signed)
Spoke with patient and he can tell he has some fluid.  He reports taking indigestion medication yesterday with relief of upper chest pain. He self adjusts Torsemide and will take extra 1 tablet for 2 days only.  He verbalized understanding after 2nd day to return to prescribed dosage of 1 tablet daily.  Next ICM check 6/17.  08/06/2022 CorVue thoracic impedance suggesting possible fluid accumulation starting 6/2.

## 2022-08-06 NOTE — Telephone Encounter (Signed)
Remote transmission received and reviewed. Normal device function. Rates do appear better controlled and no events logged. Does suggest possible fluid retention based on diagnostics.   Patient clarified he did not have chest pain today but yesterday. States he was very stressed out and his oxygen shows 97% at this time. Patient is followed by ICM RN ~ Jacki Cones, RN, will forward to update on CoreVue. Patient states he feels much better today and agrees to go to ER if chest pain returns.

## 2022-08-06 NOTE — Telephone Encounter (Signed)
Following alert received from [employer] received for Permanent AF, OAC contraindicated per EPIC. Presenting rhythm tachy.  Patient called requested to send a remote transmission to review presenting rhythm.   During conversation, patient reported to me he has had chest pain and low oxygen saturation on pulse ox. Advised patient he needs to call 911 and go to the ER now. Patient declined and states he wants to see his remote transmission data first. Advised patient regardless which this shows he needs to go to the ER for evaluation. Patient the hung up the phone.   Called patient back, states he will send remote in 20 minutes and would like for me to call pt back. Will call patient back to advise again ER d/t life threatening risk.

## 2022-08-09 DIAGNOSIS — H510 Palsy (spasm) of conjugate gaze: Secondary | ICD-10-CM | POA: Diagnosis not present

## 2022-08-09 DIAGNOSIS — H532 Diplopia: Secondary | ICD-10-CM | POA: Diagnosis not present

## 2022-08-09 DIAGNOSIS — H4921 Sixth [abducent] nerve palsy, right eye: Secondary | ICD-10-CM | POA: Diagnosis not present

## 2022-08-09 DIAGNOSIS — H5022 Vertical strabismus, left eye: Secondary | ICD-10-CM | POA: Diagnosis not present

## 2022-08-13 ENCOUNTER — Ambulatory Visit: Payer: Medicare HMO | Attending: Internal Medicine

## 2022-08-13 DIAGNOSIS — I5022 Chronic systolic (congestive) heart failure: Secondary | ICD-10-CM | POA: Diagnosis not present

## 2022-08-13 DIAGNOSIS — Z950 Presence of cardiac pacemaker: Secondary | ICD-10-CM

## 2022-08-17 NOTE — Progress Notes (Signed)
EPIC Encounter for ICM Monitoring  Patient Name: [name] is a 89 y.o. male Date: 08/17/2022 Primary Care Physican: Daisy Floro, MD Primary Cardiologist: Eldridge Dace Electrophysiologist: Ladona Ridgel  BiV Pacing: 85% 08/17/2022 Weight:  180 lbs      Spoke with patient and heart failure questions reviewed.  Transmission results reviewed.  Pt asymptomatic for fluid accumulation.  He reports his has severe back pain.      CorVue thoracic impedance normal but was suggesting possible possible fluid accumulation 6/2-6/11.   Prescribed:  Torsemide 20 mg Take 1 tablet (20 mg total) by mouth daily.   Labs: 04/28/2022 Creatinine 1.21, BUN 19, Potassium 4.1, Sodium 133, GFR 56 A complete set of results can be found in Results Review.   Recommendations:   No changes and encouraged to call if experiencing any fluid symptoms.   Follow-up plan: ICM clinic phone appointment on 09/17/2022.  91 day device clinic remote transmission 10/13/2022.     EP/Cardiology Office Visits:   12/05/2022 with Dr. Eldridge Dace.  Recall 10/15/2022 with Dr Ladona Ridgel.    Copy of ICM check sent to Dr. Ladona Ridgel.     3 month ICM trend: 08/13/2022.    12-14 Month ICM trend:     Karie Soda, RN 08/17/2022 4:21 PM

## 2022-08-27 DIAGNOSIS — H5022 Vertical strabismus, left eye: Secondary | ICD-10-CM | POA: Diagnosis not present

## 2022-08-27 DIAGNOSIS — H510 Palsy (spasm) of conjugate gaze: Secondary | ICD-10-CM | POA: Diagnosis not present

## 2022-08-27 DIAGNOSIS — H532 Diplopia: Secondary | ICD-10-CM | POA: Diagnosis not present

## 2022-08-27 DIAGNOSIS — H4921 Sixth [abducent] nerve palsy, right eye: Secondary | ICD-10-CM | POA: Diagnosis not present

## 2022-09-10 ENCOUNTER — Ambulatory Visit: Payer: Medicare HMO | Attending: Internal Medicine

## 2022-09-10 DIAGNOSIS — I5022 Chronic systolic (congestive) heart failure: Secondary | ICD-10-CM

## 2022-09-10 DIAGNOSIS — Z950 Presence of cardiac pacemaker: Secondary | ICD-10-CM

## 2022-09-11 ENCOUNTER — Telehealth: Payer: Self-pay

## 2022-09-11 NOTE — Progress Notes (Signed)
EPIC Encounter for ICM Monitoring  Patient Name: [name] is a 89 y.o. male Date: 09/11/2022 Primary Care Physican: Daisy Floro, MD Primary Cardiologist: Eldridge Dace Electrophysiologist: Ladona Ridgel  BiV Pacing: 85% 08/17/2022 Weight:  180 lbs      Attempted call to patient and unable to reach.  Left detailed message per DPR regarding transmission. Transmission reviewed.      CorVue thoracic impedance suggesting possible possible fluid accumulation starting 7/9 and returning back toward baeline.   Prescribed:  Torsemide 20 mg Take 1 tablet (20 mg total) by mouth daily.   Labs: 04/28/2022 Creatinine 1.21, BUN 19, Potassium 4.1, Sodium 133, GFR 56 A complete set of results can be found in Results Review.   Recommendations:   Left voice mail with ICM number and encouraged to call if experiencing any fluid symptoms.   Follow-up plan: ICM clinic phone appointment on 09/17/2022.  91 day device clinic remote transmission 10/13/2022.     EP/Cardiology Office Visits:   12/05/2022 with Dr. Eldridge Dace.  Recall 10/15/2022 with Dr Ladona Ridgel.    Copy of ICM check sent to Dr. Ladona Ridgel.     3 month ICM trend: 09/08/2022.    12-14 Month ICM trend:     Karie Soda, RN 09/11/2022 3:41 PM

## 2022-09-11 NOTE — Progress Notes (Signed)
Spoke with patient and heart failure questions reviewed.  Transmission results reviewed.  Pt reports feet were swollen during decreased impedance but have improved.  He is feeling okay at this time.  Next ICM check 7/22.   09/11/2022 Thoracic impedance suggesting fluid levels return to normal.

## 2022-09-11 NOTE — Telephone Encounter (Signed)
Remote ICM transmission received.  Attempted call to patient regarding ICM remote transmission and left detailed message per DPR with ICM phone number to return call.    

## 2022-09-17 ENCOUNTER — Ambulatory Visit: Payer: Medicare HMO | Attending: Internal Medicine

## 2022-09-17 DIAGNOSIS — I5022 Chronic systolic (congestive) heart failure: Secondary | ICD-10-CM

## 2022-09-17 DIAGNOSIS — Z950 Presence of cardiac pacemaker: Secondary | ICD-10-CM

## 2022-09-18 DIAGNOSIS — H409 Unspecified glaucoma: Secondary | ICD-10-CM | POA: Diagnosis not present

## 2022-09-18 DIAGNOSIS — H510 Palsy (spasm) of conjugate gaze: Secondary | ICD-10-CM | POA: Diagnosis not present

## 2022-09-18 DIAGNOSIS — H4921 Sixth [abducent] nerve palsy, right eye: Secondary | ICD-10-CM | POA: Diagnosis not present

## 2022-09-18 DIAGNOSIS — H3530 Unspecified macular degeneration: Secondary | ICD-10-CM | POA: Diagnosis not present

## 2022-09-19 ENCOUNTER — Telehealth: Payer: Self-pay

## 2022-09-19 NOTE — Telephone Encounter (Signed)
Remote ICM transmission received.  Attempted call to patient regarding ICM remote transmission and left detailed message per DPR.  Left ICM phone number and advised to return call for any fluid symptoms or questions. Next ICM remote transmission scheduled 10/22/2022.

## 2022-09-19 NOTE — Progress Notes (Signed)
EPIC Encounter for ICM Monitoring  Patient Name: [name] is a 89 y.o. male Date: 09/19/2022 Primary Care Physican: Daisy Floro, MD Primary Cardiologist: Eldridge Dace Electrophysiologist: Ladona Ridgel  BiV Pacing: 85% 08/17/2022 Weight:  180 lbs      Attempted call to patient and unable to reach.  Left detailed message per DPR regarding transmission. Transmission reviewed.    CorVue thoracic impedance suggesting possible possible fluid accumulation from 7/7-7/14 (discussed with pt on 7/16).   Prescribed:  Torsemide 20 mg Take 1 tablet (20 mg total) by mouth daily.   Labs: 04/28/2022 Creatinine 1.21, BUN 19, Potassium 4.1, Sodium 133, GFR 56 A complete set of results can be found in Results Review.   Recommendations:   Left voice mail with ICM number and encouraged to call if experiencing any fluid symptoms.   Follow-up plan: ICM clinic phone appointment on 10/22/2022.  91 day device clinic remote transmission 10/13/2022.     EP/Cardiology Office Visits:   12/05/2022 with Dr. Eldridge Dace.  Recall 10/15/2022 with Dr Ladona Ridgel.    Copy of ICM check sent to Dr. Ladona Ridgel.     3 month ICM trend: 09/17/2022.    12-14 Month ICM trend:     Karie Soda, RN 09/19/2022 7:34 AM

## 2022-10-03 ENCOUNTER — Ambulatory Visit: Payer: Medicare HMO

## 2022-10-03 DIAGNOSIS — I442 Atrioventricular block, complete: Secondary | ICD-10-CM

## 2022-10-04 LAB — CUP PACEART REMOTE DEVICE CHECK
Battery Remaining Longevity: 12 mo
Battery Remaining Percentage: 12 %
Battery Voltage: 2.86 V
Date Time Interrogation Session: 20240807043525
Implantable Lead Connection Status: 753985
Implantable Lead Connection Status: 753985
Implantable Lead Connection Status: 753985
Implantable Lead Implant Date: 20161205
Implantable Lead Implant Date: 20161205
Implantable Lead Implant Date: 20161205
Implantable Lead Location: 753858
Implantable Lead Location: 753859
Implantable Lead Location: 753860
Implantable Pulse Generator Implant Date: 20161205
Lead Channel Impedance Value: 1025 Ohm
Lead Channel Impedance Value: 490 Ohm
Lead Channel Pacing Threshold Amplitude: 0.75 V
Lead Channel Pacing Threshold Amplitude: 0.75 V
Lead Channel Pacing Threshold Pulse Width: 0.5 ms
Lead Channel Pacing Threshold Pulse Width: 0.8 ms
Lead Channel Sensing Intrinsic Amplitude: 12 mV
Lead Channel Setting Pacing Amplitude: 1.75 V
Lead Channel Setting Pacing Amplitude: 2.5 V
Lead Channel Setting Pacing Pulse Width: 0.5 ms
Lead Channel Setting Pacing Pulse Width: 0.8 ms
Lead Channel Setting Sensing Sensitivity: 2 mV
Pulse Gen Model: 3262
Pulse Gen Serial Number: 7802901

## 2022-10-19 NOTE — Progress Notes (Signed)
Remote pacemaker transmission.   

## 2022-10-22 ENCOUNTER — Ambulatory Visit: Payer: Medicare HMO

## 2022-10-22 DIAGNOSIS — I5042 Chronic combined systolic (congestive) and diastolic (congestive) heart failure: Secondary | ICD-10-CM

## 2022-10-22 DIAGNOSIS — Z950 Presence of cardiac pacemaker: Secondary | ICD-10-CM

## 2022-10-25 NOTE — Progress Notes (Signed)
EPIC Encounter for ICM Monitoring  Patient Name: [name] is a 89 y.o. male Date: 10/25/2022 Primary Care Physican: Daisy Floro, MD Primary Cardiologist: Eldridge Dace Electrophysiologist: Ladona Ridgel  BiV Pacing: 85% 08/17/2022 Weight:  180 lbs      Spoke with patient and heart failure questions reviewed.  Transmission results reviewed.  Pt reports leg swelling and advised to elevate legs when sitting.  He has arthritis in the back.     CorVue thoracic impedance suggesting possible possible fluid accumulation from 8/10-8/20.   Prescribed:  Torsemide 20 mg Take 1 tablet (20 mg total) by mouth daily.   Labs: 04/28/2022 Creatinine 1.21, BUN 19, Potassium 4.1, Sodium 133, GFR 56 A complete set of results can be found in Results Review.   Recommendations:   Encouraged to limit salt intake.  No changes and encouraged to call if experiencing any fluid symptoms.   Follow-up plan: ICM clinic phone appointment on 12/03/2022.  91 day device clinic remote transmission 01/02/2023.     EP/Cardiology Office Visits:   12/05/2022 with Dr. Eldridge Dace.  Recall 10/15/2022 with Dr Ladona Ridgel.    Copy of ICM check sent to Dr. Ladona Ridgel.    3 month ICM trend: 10/22/2022.    12-14 Month ICM trend:     Karie Soda, RN 10/25/2022 10:48 AM

## 2022-10-31 DIAGNOSIS — H532 Diplopia: Secondary | ICD-10-CM | POA: Diagnosis not present

## 2022-10-31 DIAGNOSIS — K219 Gastro-esophageal reflux disease without esophagitis: Secondary | ICD-10-CM | POA: Diagnosis not present

## 2022-10-31 DIAGNOSIS — H547 Unspecified visual loss: Secondary | ICD-10-CM | POA: Diagnosis not present

## 2022-10-31 DIAGNOSIS — I5020 Unspecified systolic (congestive) heart failure: Secondary | ICD-10-CM | POA: Diagnosis not present

## 2022-10-31 DIAGNOSIS — R1319 Other dysphagia: Secondary | ICD-10-CM | POA: Diagnosis not present

## 2022-10-31 DIAGNOSIS — Z6828 Body mass index (BMI) 28.0-28.9, adult: Secondary | ICD-10-CM | POA: Diagnosis not present

## 2022-10-31 DIAGNOSIS — J309 Allergic rhinitis, unspecified: Secondary | ICD-10-CM | POA: Diagnosis not present

## 2022-11-05 DIAGNOSIS — H401112 Primary open-angle glaucoma, right eye, moderate stage: Secondary | ICD-10-CM | POA: Diagnosis not present

## 2022-11-05 DIAGNOSIS — H401123 Primary open-angle glaucoma, left eye, severe stage: Secondary | ICD-10-CM | POA: Diagnosis not present

## 2022-11-05 DIAGNOSIS — H532 Diplopia: Secondary | ICD-10-CM | POA: Diagnosis not present

## 2022-11-27 NOTE — Progress Notes (Unsigned)
Cardiology Office Note:    Date:  11/28/2022  ID:  [name], DOB 02/24/1927, MRN [id number] PCP: Daisy Floro, MD  [facility] Providers Cardiologist:  Donato Schultz, MD       Patient Profile:      Coronary artery disease  S/p PCI to LAD in 2010 S/p 3 x 16 mm DES to pLAD in 08/2013 Post PCI c/b bradycardic arrest Myoview 08/23/17: ant-sept, apical infarct, no ischemia, intermediate risk  Permanent atrial fibrillation  Tachy-Brady syndrome  HFimpEF (heart failure with improved ejection fraction)  Ischemic CM TTE 08/2013: EF 30-35  TTE 12/28/14: EF 30-35 TTE 07/2017: EF 50-55 TTE 08/06/2018: EF 55-60, severe LAE, mod RAE, mild to mod MR, AV sclerosis S/p CRT-P Mitral regurgitation  Left Bundle Branch Block  Hypertension Hyperlipidemia  Chronic kidney disease  GERD Hx of GI bleed anticoagulation DC'd OSA Prostate CA          History of Present Illness:  Discussed the use of AI scribe software for clinical note transcription with the patient, who gave verbal consent to proceed.  [name] is a 89 y.o. male who returns for follow up of CAD, CHF, AFib. He was last seen by Dr. Eldridge Dace in 06/2021. He is followed by the [facility] clinic. His last transmision in 09/2022 suggested fluid accumulation on CoreVue measurement between 8/10-8/20.   He is here with his medical aid. He notes occasional indigestion, which is relieved by antacids. He also mentions difficulty with urination. The patient's caregiver is present during the visit and provides additional information about the patient's condition and daily routines. The patient is fairly sedentary, spending most of the day sitting and uses a walker for mobility. He sleeps with his feet propped up, which seems to help with swelling. He has not had shortness of breath at rest. He has not had orthopnea.      Review of Systems  Gastrointestinal:  Positive for dysphagia and heartburn.  See HPI     Studies Reviewed:         Risk Assessment/Calculations:    CHA2DS2-VASc Score = 5   This indicates a 7.2% annual risk of stroke. The patient's score is based upon: CHF History: 1 HTN History: 1 Diabetes History: 0 Stroke History: 0 Vascular Disease History: 1 Age Score: 2 Gender Score: 0            Physical Exam:   VS:  BP 132/80   Pulse 76   Ht 5\' 5"  (1.651 m)   Wt 174 lb 3.2 oz (79 kg)   SpO2 98%   BMI 28.99 kg/m    Wt Readings from Last 3 Encounters:  11/28/22 174 lb 3.2 oz (79 kg)  04/28/22 180 lb (81.6 kg)  10/30/21 180 lb (81.6 kg)    Constitutional:      Appearance: Not in distress.  Neck:     Vascular: No JVR.  Pulmonary:     Breath sounds: Normal breath sounds. No wheezing. No rales.  Cardiovascular:     Normal rate. Irregularly irregular rhythm.     Murmurs: There is a grade 2/6 systolic murmur at the LLSB.  Edema:    Peripheral edema absent.  Abdominal:     Palpations: Abdomen is soft.      Assessment and Plan:   Assessment & Plan Heart failure with improved ejection fraction (HFimpEF) (HCC) EF previously 30-35.  EF improved to normal after CRT-P. Echo in June 2020 showed EF 55-60%. Volume status stable.  Sedentary lifestyle. -Continue Torsemide 20mg  daily. -Obtained BMET and CBC today. Permanent atrial fibrillation (HCC) Permanent atrial fibrillation.  History of tachybradycardia syndrome.  Status post CRT-P.  He is unable to take anticoagulation due to history of GI bleeding. -Continue follow-up with Electrophysiology. Coronary artery disease involving native coronary artery of native heart without angina pectoris Status post PCI to LAD in 2010 and DES to proximal LAD in 2015.  Myoview in 2019 with anteroseptal and apical infarct but no ischemia.  He is not had chest discomfort suggestive of angina.  He does have occasional indigestion relieved by antacids.  He is unable to take aspirin due to history of GI bleeding. -Continue current management. Mitral valve  disorder Last echocardiogram in 2020 showed mild to moderate MR. Not a candidate for surgical intervention. -Plan follow-up echocardiogram based on symptoms. Stage 3a chronic kidney disease (CKD) (HCC) -Obtain follow-up BUN/Creatinine today. Prostate cancer [facility]) Reports difficulty with emptying bladder. -Encouraged follow-up with Urologist.           Dispo:  Return in about 1 year (around 11/28/2023) for Routine Follow Up, w/ Dr. Anne Fu, or Tereso Newcomer, PA-C.  Signed, Tereso Newcomer, PA-C

## 2022-11-28 ENCOUNTER — Ambulatory Visit: Payer: Medicare HMO | Attending: Interventional Cardiology | Admitting: Physician Assistant

## 2022-11-28 ENCOUNTER — Encounter (INDEPENDENT_AMBULATORY_CARE_PROVIDER_SITE_OTHER): Payer: Medicare HMO | Admitting: Ophthalmology

## 2022-11-28 ENCOUNTER — Encounter: Payer: Self-pay | Admitting: Physician Assistant

## 2022-11-28 VITALS — BP 132/80 | HR 76 | Ht 65.0 in | Wt 174.2 lb

## 2022-11-28 DIAGNOSIS — I059 Rheumatic mitral valve disease, unspecified: Secondary | ICD-10-CM | POA: Diagnosis not present

## 2022-11-28 DIAGNOSIS — N1831 Chronic kidney disease, stage 3a: Secondary | ICD-10-CM | POA: Diagnosis not present

## 2022-11-28 DIAGNOSIS — I4821 Permanent atrial fibrillation: Secondary | ICD-10-CM | POA: Diagnosis not present

## 2022-11-28 DIAGNOSIS — C61 Malignant neoplasm of prostate: Secondary | ICD-10-CM

## 2022-11-28 DIAGNOSIS — I5042 Chronic combined systolic (congestive) and diastolic (congestive) heart failure: Secondary | ICD-10-CM | POA: Diagnosis not present

## 2022-11-28 DIAGNOSIS — I5032 Chronic diastolic (congestive) heart failure: Secondary | ICD-10-CM | POA: Diagnosis not present

## 2022-11-28 DIAGNOSIS — I2510 Atherosclerotic heart disease of native coronary artery without angina pectoris: Secondary | ICD-10-CM | POA: Diagnosis not present

## 2022-11-28 MED ORDER — TORSEMIDE 20 MG PO TABS: 20.0000 mg | ORAL_TABLET | Freq: Every day | ORAL | 3 refills | Status: DC

## 2022-11-28 NOTE — Patient Instructions (Signed)
Medication Instructions:  Your physician recommends that you continue on your current medications as directed. Please refer to the Current Medication list given to you today.  *If you need a refill on your cardiac medications before your next appointment, please call your pharmacy*   Lab Work: TODAY:  BMET & CBC  If you have labs (blood work) drawn today and your tests are completely normal, you will receive your results only by: MyChart Message (if you have MyChart) OR A paper copy in the mail If you have any lab test that is abnormal or we need to change your treatment, we will call you to review the results.   Testing/Procedures: None ordered   Follow-Up: At [facility], you and your health needs are our priority.  As part of our continuing mission to provide you with exceptional heart care, we have created designated Provider Care Teams.  These Care Teams include your primary Cardiologist (physician) and Advanced Practice Providers (APPs -  Physician Assistants and Nurse Practitioners) who all work together to provide you with the care you need, when you need it.  We recommend signing up for the patient portal called "MyChart".  Sign up information is provided on this After Visit Summary.  MyChart is used to connect with patients for Virtual Visits (Telemedicine).  Patients are able to view lab/test results, encounter notes, upcoming appointments, etc.  Non-urgent messages can be sent to your provider as well.   To learn more about what you can do with MyChart, go to [email or web address].    Your next appointment:   12 month(s)  Provider:   Donato Schultz, MD  or Tereso Newcomer, PA-C         Other Instructions

## 2022-11-28 NOTE — Assessment & Plan Note (Signed)
EF previously 30-35.  EF improved to normal after CRT-P. Echo in June 2020 showed EF 55-60%. Volume status stable. Sedentary lifestyle. -Continue Torsemide 20mg  daily. -Obtained BMET and CBC today.

## 2022-11-28 NOTE — Assessment & Plan Note (Signed)
Reports difficulty with emptying bladder. -Encouraged follow-up with Urologist.

## 2022-11-28 NOTE — Assessment & Plan Note (Signed)
-  Obtain follow-up BUN/Creatinine today.

## 2022-11-28 NOTE — Assessment & Plan Note (Signed)
Last echocardiogram in 2020 showed mild to moderate MR. Not a candidate for surgical intervention. -Plan follow-up echocardiogram based on symptoms.

## 2022-11-28 NOTE — Assessment & Plan Note (Signed)
Permanent atrial fibrillation.  History of tachybradycardia syndrome.  Status post CRT-P.  He is unable to take anticoagulation due to history of GI bleeding. -Continue follow-up with Electrophysiology.

## 2022-11-28 NOTE — Assessment & Plan Note (Signed)
Status post PCI to LAD in 2010 and DES to proximal LAD in 2015.  Myoview in 2019 with anteroseptal and apical infarct but no ischemia.  He is not had chest discomfort suggestive of angina.  He does have occasional indigestion relieved by antacids.  He is unable to take aspirin due to history of GI bleeding. -Continue current management.

## 2022-11-29 ENCOUNTER — Telehealth: Payer: Self-pay

## 2022-11-29 LAB — CBC
Hematocrit: 46.3 % (ref 37.5–51.0)
Hemoglobin: 15.4 g/dL (ref 13.0–17.7)
MCH: 31.4 pg (ref 26.6–33.0)
MCHC: 33.3 g/dL (ref 31.5–35.7)
MCV: 95 fL (ref 79–97)
Platelets: 157 10*3/uL (ref 150–450)
RBC: 4.9 x10E6/uL (ref 4.14–5.80)
RDW: 13.3 % (ref 11.6–15.4)
WBC: 7.8 10*3/uL (ref 3.4–10.8)

## 2022-11-29 LAB — BASIC METABOLIC PANEL
BUN/Creatinine Ratio: 17 (ref 10–24)
BUN: 17 mg/dL (ref 10–36)
CO2: 22 mmol/L (ref 20–29)
Calcium: 9.5 mg/dL (ref 8.6–10.2)
Chloride: 91 mmol/L — ABNORMAL LOW (ref 96–106)
Creatinine, Ser: 1.03 mg/dL (ref 0.76–1.27)
Glucose: 102 mg/dL — ABNORMAL HIGH (ref 70–99)
Potassium: 4.5 mmol/L (ref 3.5–5.2)
Sodium: 130 mmol/L — ABNORMAL LOW (ref 134–144)
eGFR: 67 mL/min/{1.73_m2} (ref >59)

## 2022-11-29 MED ORDER — TORSEMIDE 20 MG PO TABS
ORAL_TABLET | ORAL | 3 refills | Status: DC
  Filled 2023-09-11: qty 90, 60d supply, fill #0
  Filled 2023-09-11: qty 90, 90d supply, fill #0

## 2022-11-29 NOTE — Telephone Encounter (Signed)
The patient has been notified of the result and verbalized understanding.  All questions (if any) were answered. Frutoso Schatz, RN 11/29/2022 3:32 PM

## 2022-11-29 NOTE — Telephone Encounter (Signed)
-----   Message from [place] sent at 11/29/2022 11:16 AM EDT ----- Let's decrease Torsemide to 10 mg on Mon, Wed, Fri and 20 mg all other days. Repeat BMET in 2 weeks. Tereso Newcomer, PA-C    11/29/2022 11:16 AM

## 2022-11-30 NOTE — Telephone Encounter (Signed)
Recommendations reviewed with Raina and verbalized understanding./cy

## 2022-11-30 NOTE — Telephone Encounter (Signed)
Returned call to private care employee, per pt, verbal permission to speak to her.  All and any questions were answered.

## 2022-11-30 NOTE — Telephone Encounter (Signed)
A [employer] Mechanic who states she is with ?private? care called in on behalf of the patient. She is with him right now. She states patient would like clarification on his lab results. She requested that the call be returned to the patient to further discuss.

## 2022-12-03 ENCOUNTER — Ambulatory Visit: Payer: Medicare HMO | Attending: Internal Medicine

## 2022-12-03 DIAGNOSIS — M549 Dorsalgia, unspecified: Secondary | ICD-10-CM | POA: Diagnosis not present

## 2022-12-03 DIAGNOSIS — Z950 Presence of cardiac pacemaker: Secondary | ICD-10-CM

## 2022-12-03 DIAGNOSIS — M1990 Unspecified osteoarthritis, unspecified site: Secondary | ICD-10-CM | POA: Diagnosis not present

## 2022-12-03 DIAGNOSIS — I5032 Chronic diastolic (congestive) heart failure: Secondary | ICD-10-CM

## 2022-12-03 DIAGNOSIS — R609 Edema, unspecified: Secondary | ICD-10-CM | POA: Diagnosis not present

## 2022-12-03 DIAGNOSIS — Z8679 Personal history of other diseases of the circulatory system: Secondary | ICD-10-CM | POA: Diagnosis not present

## 2022-12-03 DIAGNOSIS — I25119 Atherosclerotic heart disease of native coronary artery with unspecified angina pectoris: Secondary | ICD-10-CM | POA: Diagnosis not present

## 2022-12-03 DIAGNOSIS — Z23 Encounter for immunization: Secondary | ICD-10-CM | POA: Diagnosis not present

## 2022-12-03 DIAGNOSIS — F419 Anxiety disorder, unspecified: Secondary | ICD-10-CM | POA: Diagnosis not present

## 2022-12-03 DIAGNOSIS — N183 Chronic kidney disease, stage 3 unspecified: Secondary | ICD-10-CM | POA: Diagnosis not present

## 2022-12-03 DIAGNOSIS — M961 Postlaminectomy syndrome, not elsewhere classified: Secondary | ICD-10-CM | POA: Diagnosis not present

## 2022-12-05 ENCOUNTER — Ambulatory Visit: Payer: Medicare HMO | Admitting: Interventional Cardiology

## 2022-12-06 ENCOUNTER — Encounter (INDEPENDENT_AMBULATORY_CARE_PROVIDER_SITE_OTHER): Payer: Medicare HMO | Admitting: Ophthalmology

## 2022-12-06 DIAGNOSIS — I10 Essential (primary) hypertension: Secondary | ICD-10-CM

## 2022-12-06 DIAGNOSIS — H353112 Nonexudative age-related macular degeneration, right eye, intermediate dry stage: Secondary | ICD-10-CM | POA: Diagnosis not present

## 2022-12-06 DIAGNOSIS — H35033 Hypertensive retinopathy, bilateral: Secondary | ICD-10-CM

## 2022-12-06 DIAGNOSIS — H353221 Exudative age-related macular degeneration, left eye, with active choroidal neovascularization: Secondary | ICD-10-CM

## 2022-12-06 DIAGNOSIS — H34832 Tributary (branch) retinal vein occlusion, left eye, with macular edema: Secondary | ICD-10-CM | POA: Diagnosis not present

## 2022-12-06 DIAGNOSIS — H43811 Vitreous degeneration, right eye: Secondary | ICD-10-CM | POA: Diagnosis not present

## 2022-12-07 ENCOUNTER — Telehealth: Payer: Self-pay

## 2022-12-07 NOTE — Telephone Encounter (Signed)
Remote ICM transmission received.  Attempted call to patient regarding ICM remote transmission and no answer.  

## 2022-12-07 NOTE — Progress Notes (Signed)
EPIC Encounter for ICM Monitoring  Patient Name: [name] is a 89 y.o. male Date: 12/07/2022 Primary Care Physican: Daisy Floro, MD Primary Cardiologist: Eldridge Dace Electrophysiologist: Ladona Ridgel  BiV Pacing: 84% 08/17/2022 Weight:  180 lbs  11/28/2022 Office Weight:      Attempted call to patient and unable to reach.   Transmission reviewed.    CorVue thoracic impedance suggesting possible possible fluid accumulation from 8/10-8/20.   Prescribed:  Torsemide 20 mg Take 1 tablet (20 mg total) by mouth daily.   Labs: 04/28/2022 Creatinine 1.21, BUN 19, Potassium 4.1, Sodium 133, GFR 56 A complete set of results can be found in Results Review.   Recommendations:   Unable to reach.     Follow-up plan: ICM clinic phone appointment on 01/07/2023.  91 day device clinic remote transmission 01/02/2023.     EP/Cardiology Office Visits:   Canceled 12/05/2022 with Dr. Eldridge Dace.  Recall 10/15/2022 with Dr Ladona Ridgel.    Copy of ICM check sent to Dr. Ladona Ridgel.    3 month ICM trend: 12/03/2022.    12-14 Month ICM trend:     Karie Soda, RN 12/07/2022 2:22 PM

## 2022-12-13 ENCOUNTER — Ambulatory Visit: Payer: Medicare HMO

## 2022-12-14 DIAGNOSIS — K219 Gastro-esophageal reflux disease without esophagitis: Secondary | ICD-10-CM | POA: Diagnosis not present

## 2022-12-14 DIAGNOSIS — R1311 Dysphagia, oral phase: Secondary | ICD-10-CM | POA: Diagnosis not present

## 2022-12-14 DIAGNOSIS — R194 Change in bowel habit: Secondary | ICD-10-CM | POA: Diagnosis not present

## 2022-12-26 DIAGNOSIS — N35011 Post-traumatic bulbous urethral stricture: Secondary | ICD-10-CM | POA: Diagnosis not present

## 2022-12-26 DIAGNOSIS — R3121 Asymptomatic microscopic hematuria: Secondary | ICD-10-CM | POA: Diagnosis not present

## 2022-12-26 DIAGNOSIS — R3912 Poor urinary stream: Secondary | ICD-10-CM | POA: Diagnosis not present

## 2022-12-26 DIAGNOSIS — R3914 Feeling of incomplete bladder emptying: Secondary | ICD-10-CM | POA: Diagnosis not present

## 2022-12-26 DIAGNOSIS — C61 Malignant neoplasm of prostate: Secondary | ICD-10-CM | POA: Diagnosis not present

## 2022-12-27 ENCOUNTER — Ambulatory Visit: Payer: Medicare HMO | Attending: Internal Medicine | Admitting: Internal Medicine

## 2022-12-27 ENCOUNTER — Encounter: Payer: Self-pay | Admitting: Internal Medicine

## 2022-12-27 VITALS — BP 130/78 | HR 78 | Ht 65.0 in | Wt 179.8 lb

## 2022-12-27 DIAGNOSIS — R001 Bradycardia, unspecified: Secondary | ICD-10-CM | POA: Diagnosis not present

## 2022-12-27 DIAGNOSIS — Z79899 Other long term (current) drug therapy: Secondary | ICD-10-CM

## 2022-12-27 DIAGNOSIS — I059 Rheumatic mitral valve disease, unspecified: Secondary | ICD-10-CM | POA: Diagnosis not present

## 2022-12-27 DIAGNOSIS — I4821 Permanent atrial fibrillation: Secondary | ICD-10-CM | POA: Diagnosis not present

## 2022-12-27 NOTE — Progress Notes (Signed)
HPI [name] returns today for followup. He is a pleasant elderly man with symptomatic bradycardia, chronic systolic heart failure with near normalization of his LV function, lung disease, HTN, and is s/p Biv PPM insertion. In the interim, he has been stable.  He denies chest pain. His chf symptoms are class 2. No syncope. He has become a bit more sedentary. He has a h/o GI bleeding and has not been anti-coagulated.  Allergies  Allergen Reactions   Gabapentin     Other reaction(s): mood changes   Parecoxib Sodium [Parecoxib]    Codeine Anxiety and Other (See Comments)    Insomnia/ hyper   Warfarin Sodium Anxiety and Other (See Comments)    Hx GI bleed     Current Outpatient Medications  Medication Sig Dispense Refill   brimonidine (ALPHAGAN P) 0.1 % SOLN Place 1 drop into both eyes 2 (two) times daily.      diazepam (VALIUM) 5 MG tablet Take 5 mg by mouth as needed.     dorzolamide-timolol (COSOPT) 22.3-6.8 MG/ML ophthalmic solution Place 1 drop into both eyes 2 (two) times daily.   2   esomeprazole (NEXIUM) 40 MG capsule Take 40 mg by mouth daily as needed (for acid reflux).     latanoprost (XALATAN) 0.005 % ophthalmic solution Place 1 drop into both eyes at bedtime.     nitroGLYCERIN (NITROSTAT) 0.4 MG SL tablet Place 1 tablet (0.4 mg total) under the tongue every 5 (five) minutes as needed for chest pain. 20 tablet 0   potassium chloride (KLOR-CON M) 10 MEQ tablet Take 10 mEq by mouth daily.     Probiotic Product (PROBIOTIC PO) Take 1 capsule by mouth daily.     tamsulosin (FLOMAX) 0.4 MG CAPS capsule Take 1 capsule (0.4 mg total) by mouth daily. (Patient taking differently: Take 0.4 mg by mouth in the morning and at bedtime.) 30 capsule 0   torsemide (DEMADEX) 20 MG tablet Take 1 tablet (20 mg) daily except on Monday, Wednesday, and Friday take 1/2 tablet (10mg ) 90 tablet 3   traMADol (ULTRAM) 50 MG tablet Take 50 mg by mouth 2 (two) times daily as needed.     acetaminophen  (TYLENOL) 500 MG tablet Take 500 mg by mouth daily as needed for mild pain. And take two by mouth twice daily at 6 am and 4:30 pm for pain (Patient not taking: Reported on 11/28/2022)     No current facility-administered medications for this visit.     Past Medical History:  Diagnosis Date   Anxiety    Arthritis    Atrial fibrillation, permanent (HCC) 01/07/2016   Blood transfusion    "related to a surgery"   BPH (benign prostatic hypertrophy) with urinary obstruction    s/p turp yrs ago   Bradycardia    a. Amio d/c'd 08/2013; brady arrest 08/2013 after PCI >>> recurrent AF >>> Amiodarone restarted. b. Pacemaker being considered in 11/2014.   CAD (coronary artery disease)    a. s/p MI and prior PCI of LAD;  b. LHC (08/2013):  prox LAD 60-70%, mid LAD stents ok, ostial lesion at Dx jailed by stent, mild CFX and RCA disesase >>>  PCI (09/08/13):  rotational atherectomy + Promus DES to prox LAD   Cardiomyopathy [facility])    a. Echo (08/2013):  EF 30-35%, AS hypokinesis, Gr 1 diast dysfn, mild MR, mild LAE >>> b. improved EF 50-55% by echo 8/15. c. EF down again by echo 12/2014 to 30-35%  but 51% by nuc.   Chronic lower back pain    Chronic systolic CHF (congestive heart failure) (HCC)    CKD (chronic kidney disease), stage III (HCC)    a. Per review of labs baseline Cr 1.1-1.3.   DDD (degenerative disc disease)    chronic back pain   Depression    Diverticulitis of colon with bleeding    s/p sigmoid resection '88   DJD (degenerative joint disease) hips and knees   s/p bilateral total replacements   GERD (gastroesophageal reflux disease)    occ. take prevacid   Glaucoma    Hearing impaired person, right    History of GI diverticular bleed april 2012   transfused blood and resolved without surgical intervention   Hypertension    Impaired hearing bilateral    only left hearing aid   Impotence, organic    s/p penile prosthesis 1990's   Incomplete bladder emptying    LBBB (left bundle branch  block)    Meningioma (HCC) right -sided w/ right VI palsy   followed by dr Sharene Skeans   Mitral regurgitation    a. Mild-mod by echo 12/2014.   Myocardial infarction (HCC) 1980's- medical intervention   "so mild I didn't know I'd had it"   PAF (paroxysmal atrial fibrillation) (HCC)    not on coumadin due to hx of GI bleed.   Prostate cancer (HCC) 11/30/13   Gleason 8, volume 22.14 cc   Sleep apnea    non-compliant cpap   Uses hearing aid    left    Vitreous hemorrhage (HCC)    related to branch retinal vein occlusion left eye   Wears glasses     ROS:   All systems reviewed and negative except as noted in the HPI.   Past Surgical History:  Procedure Laterality Date   APPENDECTOMY     CARDIAC CATHETERIZATION  2007   noncritical cad (results w/ chart)   CARDIOVERSION N/A 10/13/2015   Procedure: CARDIOVERSION;  Surgeon: Wendall Stade, MD;  Location: [facility] ENDOSCOPY;  Service: Cardiovascular;  Laterality: N/A;   CATARACT EXTRACTION W/ INTRAOCULAR LENS  IMPLANT, BILATERAL Bilateral ~ 2000   CHOLECYSTECTOMY     CLOSED REDUCTION HIP DISLOCATION Right "several"   CORONARY ANGIOPLASTY WITH STENT PLACEMENT  08-03-08   drug-eluting stent x2 distal and mid lad   EP IMPLANTABLE DEVICE N/A 01/31/2015   Procedure: BiV Pacemaker Insertion CRT-P;  Surgeon: Marinus Maw, MD;  Location: [facility] INVASIVE CV LAB;  Service: Cardiovascular;  Laterality: N/A;   EP IMPLANTABLE DEVICE N/A 02/07/2015   Procedure: Lead Revision/Repair;  Surgeon: Duke Salvia, MD;  Location: [facility] INVASIVE CV LAB;  Service: Cardiovascular;  Laterality: N/A;   ESOPHAGOGASTRODUODENOSCOPY N/A 02/11/2014   Procedure: ESOPHAGOGASTRODUODENOSCOPY (EGD);  Surgeon: Florencia Reasons, MD;  Location: [facility] ENDOSCOPY;  Service: Endoscopy;  Laterality: N/A;   gamma knife radiation  2000   [facility] for meningioma, last eval 2013- no change   GAS INSERTION Left 07/14/2019   Procedure: INSERTION OF GAS;  Surgeon: Sherrie George, MD;  Location: [facility] OR;   Service: Ophthalmology;  Laterality: Left;   INGUINAL HERNIA REPAIR Bilateral    INNER EAR SURGERY Right yrs ago   "trying to get my hearing back   LEFT HEART CATHETERIZATION WITH CORONARY ANGIOGRAM N/A 09/04/2013   Procedure: LEFT HEART CATHETERIZATION WITH CORONARY ANGIOGRAM;  Surgeon: Corky Crafts, MD;  Location: [facility] CATH LAB;  Service: Cardiovascular;  Laterality: N/A;   PARS PLANA VITRECTOMY Left 07/14/2019  27 GAUGE   PARS PLANA VITRECTOMY 27 GAUGE Left 07/14/2019   Procedure: PARS PLANA VITRECTOMY 27 GAUGE;  Surgeon: Sherrie George, MD;  Location: [facility] OR;  Service: Ophthalmology;  Laterality: Left;   PENILE PROSTHESIS IMPLANT  1990's   PERCUTANEOUS CORONARY ROTOBLATOR INTERVENTION (PCI-R) N/A 09/08/2013   Procedure: PERCUTANEOUS CORONARY ROTOBLATOR INTERVENTION (PCI-R);  Surgeon: Corky Crafts, MD;  Location: [facility] CATH LAB;  Service: Cardiovascular;  Laterality: N/A;   PHOTOCOAGULATION WITH LASER Left 07/14/2019   Procedure: PHOTOCOAGULATION WITH LASER;  Surgeon: Sherrie George, MD;  Location: [facility] OR;  Service: Ophthalmology;  Laterality: Left;   PROSTATE BIOPSY  11/30/13   Gleason 8, vol 22.14 cc   REVISION TOTAL KNEE ARTHROPLASTY Right    SHOULDER OPEN ROTATOR CUFF REPAIR Left    TOTAL HIP ARTHROPLASTY Right 03-25-08--   TOTAL HIP ARTHROPLASTY Left 2005   TOTAL HIP REVISION Right 3-4 times   TOTAL KNEE ARTHROPLASTY Right 2004   TOTAL KNEE ARTHROPLASTY Left 1997   TRANSURETHRAL RESECTION OF PROSTATE  "years ago"   TRANSURETHRAL RESECTION OF PROSTATE  01/08/2011   Procedure: TRANSURETHRAL RESECTION OF THE PROSTATE (TURP);  Surgeon: Marcine Matar;  Location: [facility];  Service: Urology;  Laterality: N/A;  GYRUS      Family History  Problem Relation Age of Onset   Heart disease Mother    Heart attack Mother    Emphysema Father    Cancer Brother        liver cancer   Cancer Other      Social History   Socioeconomic History   Marital  status: Widowed    Spouse name: Not on file   Number of children: Not on file   Years of education: Not on file   Highest education level: Not on file  Occupational History   Not on file  Tobacco Use   Smoking status: Former    Current packs/day: 0.00    Average packs/day: 1 pack/day for 14.0 years (14.0 ttl pk-yrs)    Types: Cigarettes    Start date: 01/04/1945    Quit date: 01/05/1959    Years since quitting: 64.0   Smokeless tobacco: Never  Vaping Use   Vaping status: Never Used  Substance and Sexual Activity   Alcohol use: Yes    Comment: social    Drug use: No   Sexual activity: Not Currently  Other Topics Concern   Not on file  Social History Narrative   Not on file   Social Determinants of Health   Financial Resource Strain: Not on file  Food Insecurity: Not on file  Transportation Needs: No Transportation Needs (05/10/2022)   PRAPARE - Administrator, Civil Service (Medical): No    Lack of Transportation (Non-Medical): No  Physical Activity: Not on file  Stress: Not on file  Social Connections: Not on file  Intimate Partner Violence: Not on file     BP 130/78 (BP Location: Right Arm, Patient Position: Sitting, Cuff Size: Normal)   Pulse 78   Ht 5\' 5"  (1.651 m)   Wt 179 lb 12.8 oz (81.6 kg)   SpO2 96%   BMI 29.92 kg/m   Physical Exam:  Well appearing NAD HEENT: Unremarkable Neck:  No JVD, no thyromegally Lymphatics:  No adenopathy Back:  No CVA tenderness Lungs:  Clear HEART:  Regular rate rhythm, no murmurs, no rubs, no clicks Abd:  soft, positive bowel sounds, no organomegally, no rebound, no guarding Ext:  2  plus pulses, no edema, no cyanosis, no clubbing Skin:  No rashes no nodules Neuro:  CN II through XII intact, motor grossly intact   DEVICE  Normal device function.  See PaceArt for details.   Assess/Plan:  1.Acute on chronic systolic/diastolic heart failure - his symptoms remain class 2. He will continue his medical  therapy. His [place] is up a bit. 2. Atrial fib/flutter- he is well controlled. He is Biv pacing 84% of the time. He is not anti-coagulated due to a h/o GI bleeding 3. PPM - his [employer] biv ppm is working normally. We will recheck in several months. He is programmed VVIR 4. HTN - his bp is well controlled. He usually runs a little lower.    Leonia Reeves.D.

## 2022-12-27 NOTE — Patient Instructions (Addendum)
Medication Instructions:  Your physician recommends that you continue on your current medications as directed. Please refer to the Current Medication list given to you today.  *If you need a refill on your cardiac medications before your next appointment, please call your pharmacy*  Lab Work: Today!  If you have labs (blood work) drawn today and your tests are completely normal, you will receive your results only by: MyChart Message (if you have MyChart) OR A paper copy in the mail If you have any lab test that is abnormal or we need to change your treatment, we will call you to review the results.  Testing/Procedures: None ordered.  Follow-Up: At [facility], you and your health needs are our priority.  As part of our continuing mission to provide you with exceptional heart care, we have created designated Provider Care Teams.  These Care Teams include your primary Cardiologist (physician) and Advanced Practice Providers (APPs -  Physician Assistants and Nurse Practitioners) who all work together to provide you with the care you need, when you need it.  We recommend signing up for the patient portal called "MyChart".  Sign up information is provided on this After Visit Summary.  MyChart is used to connect with patients for Virtual Visits (Telemedicine).  Patients are able to view lab/test results, encounter notes, upcoming appointments, etc.  Non-urgent messages can be sent to your provider as well.   To learn more about what you can do with MyChart, go to [email or web address].    Your next appointment:   1 year(s)  The format for your next appointment:   In Person  Provider:   Lewayne Bunting, MD{or one of the following Advanced Practice Providers on your designated Care Team:   Francis Dowse, [place] Casimiro Needle "Mardelle Matte" [place], [place] Earnest Rosier, NP  Remote monitoring is used to monitor your Pacemaker/ ICD from home. This monitoring reduces the number of office visits required to check  your device to one time per year. It allows [place] to keep an eye on the functioning of your device to ensure it is working properly.  Important Information About Sugar

## 2022-12-28 ENCOUNTER — Other Ambulatory Visit (HOSPITAL_BASED_OUTPATIENT_CLINIC_OR_DEPARTMENT_OTHER): Payer: Self-pay

## 2022-12-28 LAB — CBC
Hematocrit: 43.1 % (ref 37.5–51.0)
Hemoglobin: 14 g/dL (ref 13.0–17.7)
MCH: 31.1 pg (ref 26.6–33.0)
MCHC: 32.5 g/dL (ref 31.5–35.7)
MCV: 96 fL (ref 79–97)
Platelets: 138 10*3/uL — ABNORMAL LOW (ref 150–450)
RBC: 4.5 x10E6/uL (ref 4.14–5.80)
RDW: 13.1 % (ref 11.6–15.4)
WBC: 5.3 10*3/uL (ref 3.4–10.8)

## 2022-12-31 ENCOUNTER — Other Ambulatory Visit (HOSPITAL_BASED_OUTPATIENT_CLINIC_OR_DEPARTMENT_OTHER): Payer: Self-pay

## 2022-12-31 ENCOUNTER — Other Ambulatory Visit: Payer: Self-pay

## 2022-12-31 ENCOUNTER — Telehealth: Payer: Self-pay | Admitting: Internal Medicine

## 2022-12-31 DIAGNOSIS — Z79899 Other long term (current) drug therapy: Secondary | ICD-10-CM

## 2022-12-31 MED ORDER — BRIMONIDINE TARTRATE 0.1 % OP SOLN
1.0000 [drp] | Freq: Two times a day (BID) | OPHTHALMIC | 3 refills | Status: DC
  Filled 2023-01-03: qty 10, 100d supply, fill #0

## 2022-12-31 MED ORDER — CLOPIDOGREL BISULFATE 75 MG PO TABS
75.0000 mg | ORAL_TABLET | Freq: Every day | ORAL | 3 refills | Status: DC
  Filled 2022-12-31: qty 90, 90d supply, fill #0
  Filled 2023-06-11: qty 90, 90d supply, fill #1

## 2022-12-31 NOTE — Telephone Encounter (Signed)
Spoke with Pt. Would like to know if based on the lab results should he be on plavix. Told him I would speak with Dr Ladona Ridgel and return his call.

## 2022-12-31 NOTE — Telephone Encounter (Signed)
Pt calling back stating he needs to know if he should continue taking his medication. I could not make out what medication he was referring to but he stated him and Dr. Ladona Ridgel discussed at last appt and he was waiting for lab results.

## 2022-12-31 NOTE — Telephone Encounter (Signed)
Restart plavix 75 mg daily, check CBC in 3 months. Have him call if his stools turn black or he starts bleeding in his stool. GT

## 2022-12-31 NOTE — Telephone Encounter (Signed)
Spoke with Pt. Called in plavix at preferred pharmacy.

## 2023-01-02 ENCOUNTER — Ambulatory Visit (INDEPENDENT_AMBULATORY_CARE_PROVIDER_SITE_OTHER): Payer: Medicare HMO

## 2023-01-02 ENCOUNTER — Other Ambulatory Visit (HOSPITAL_BASED_OUTPATIENT_CLINIC_OR_DEPARTMENT_OTHER): Payer: Self-pay

## 2023-01-02 DIAGNOSIS — N35811 Other urethral stricture, male, meatal: Secondary | ICD-10-CM | POA: Diagnosis not present

## 2023-01-02 DIAGNOSIS — I442 Atrioventricular block, complete: Secondary | ICD-10-CM | POA: Diagnosis not present

## 2023-01-02 DIAGNOSIS — E230 Hypopituitarism: Secondary | ICD-10-CM | POA: Diagnosis not present

## 2023-01-02 DIAGNOSIS — R3912 Poor urinary stream: Secondary | ICD-10-CM | POA: Diagnosis not present

## 2023-01-02 LAB — CUP PACEART REMOTE DEVICE CHECK
Battery Remaining Longevity: 9 mo
Battery Remaining Percentage: 9 %
Battery Voltage: 2.84 V
Date Time Interrogation Session: 20241106121547
Implantable Lead Connection Status: 753985
Implantable Lead Connection Status: 753985
Implantable Lead Connection Status: 753985
Implantable Lead Implant Date: 20161205
Implantable Lead Implant Date: 20161205
Implantable Lead Implant Date: 20161205
Implantable Lead Location: 753858
Implantable Lead Location: 753859
Implantable Lead Location: 753860
Implantable Pulse Generator Implant Date: 20161205
Lead Channel Impedance Value: 460 Ohm
Lead Channel Impedance Value: 960 Ohm
Lead Channel Pacing Threshold Amplitude: 0.75 V
Lead Channel Pacing Threshold Amplitude: 0.75 V
Lead Channel Pacing Threshold Pulse Width: 0.5 ms
Lead Channel Pacing Threshold Pulse Width: 0.8 ms
Lead Channel Sensing Intrinsic Amplitude: 12 mV
Lead Channel Setting Pacing Amplitude: 1.75 V
Lead Channel Setting Pacing Amplitude: 2.5 V
Lead Channel Setting Pacing Pulse Width: 0.5 ms
Lead Channel Setting Pacing Pulse Width: 0.8 ms
Lead Channel Setting Sensing Sensitivity: 2 mV
Pulse Gen Model: 3262
Pulse Gen Serial Number: 7802901

## 2023-01-02 MED ORDER — TAMSULOSIN HCL 0.4 MG PO CAPS
0.4000 mg | ORAL_CAPSULE | Freq: Every day | ORAL | 3 refills | Status: DC
  Filled 2023-01-02: qty 90, 90d supply, fill #0

## 2023-01-03 ENCOUNTER — Other Ambulatory Visit (HOSPITAL_BASED_OUTPATIENT_CLINIC_OR_DEPARTMENT_OTHER): Payer: Self-pay

## 2023-01-07 ENCOUNTER — Ambulatory Visit: Payer: Medicare HMO | Attending: Internal Medicine

## 2023-01-07 DIAGNOSIS — I5042 Chronic combined systolic (congestive) and diastolic (congestive) heart failure: Secondary | ICD-10-CM

## 2023-01-07 DIAGNOSIS — Z950 Presence of cardiac pacemaker: Secondary | ICD-10-CM | POA: Diagnosis not present

## 2023-01-11 ENCOUNTER — Telehealth: Payer: Self-pay

## 2023-01-11 NOTE — Progress Notes (Signed)
EPIC Encounter for ICM Monitoring  Patient Name: [name] is a 89 y.o. male Date: 01/11/2023 Primary Care Physican: Daisy Floro, MD Primary Cardiologist: Eldridge Dace Electrophysiologist: Ladona Ridgel  BiV Pacing: 80% 08/17/2022 Weight:  180 lbs  12/27/2022 Office Weight: 179 lbs     Attempted call to patient and unable to reach.   Transmission reviewed.    CorVue thoracic impedance suggesting normal fluid levels with the exception of possible possible fluid accumulation from 10/17-11/5.   Prescribed:  Torsemide 20 mg Take 1 tablet (20 mg total) by mouth daily except on Monday, Wednesday and Friday take 0.5 tablet (10 mg total). Potassium 10 mEq take 1 tablet daily   Labs: 11/28/2023 Creatinine 1.03, BUN 17, Potassium 4.5, Sodium 130, GFR 67 04/28/2022 Creatinine 1.21, BUN 19, Potassium 4.1, Sodium 133, GFR 56 A complete set of results can be found in Results Review.   Recommendations:   Unable to reach.     Follow-up plan: ICM clinic phone appointment on 02/25/2023.  91 day device clinic remote transmission 04/03/2023.     EP/Cardiology Office Visits:   Canceled 12/05/2022 with Dr. Eldridge Dace.  Recall 12/22/2023 with Dr Ladona Ridgel.    Copy of ICM check sent to Dr. Ladona Ridgel.    3 month ICM trend: 01/07/2023.    12-14 Month ICM trend:     Karie Soda, RN 01/11/2023 12:40 PM

## 2023-01-11 NOTE — Telephone Encounter (Signed)
 Remote ICM transmission received.  Attempted call to patient regarding ICM remote transmission and left detailed message per DPR.  Left ICM phone number and advised to return call for any fluid symptoms or questions. Next ICM remote transmission scheduled 02/25/2023.

## 2023-01-22 DIAGNOSIS — Z0000 Encounter for general adult medical examination without abnormal findings: Secondary | ICD-10-CM | POA: Diagnosis not present

## 2023-01-22 DIAGNOSIS — Z1331 Encounter for screening for depression: Secondary | ICD-10-CM | POA: Diagnosis not present

## 2023-01-22 NOTE — Progress Notes (Signed)
Remote pacemaker transmission.   

## 2023-01-28 ENCOUNTER — Other Ambulatory Visit: Payer: Self-pay

## 2023-01-28 ENCOUNTER — Other Ambulatory Visit (HOSPITAL_BASED_OUTPATIENT_CLINIC_OR_DEPARTMENT_OTHER): Payer: Self-pay

## 2023-01-28 MED ORDER — DIAZEPAM 5 MG PO TABS
5.0000 mg | ORAL_TABLET | Freq: Every day | ORAL | 0 refills | Status: DC | PRN
  Filled 2023-01-28: qty 30, 30d supply, fill #0

## 2023-01-28 MED ORDER — LATANOPROST 0.005 % OP SOLN
1.0000 [drp] | Freq: Every day | OPHTHALMIC | 3 refills | Status: DC
  Filled 2023-01-28: qty 5, 90d supply, fill #0
  Filled 2023-06-13: qty 5, 50d supply, fill #1
  Filled 2023-06-28: qty 5, 90d supply, fill #2
  Filled 2023-07-11: qty 5, 50d supply, fill #2

## 2023-01-28 MED ORDER — ALPHAGAN P 0.1 % OP SOLN
1.0000 [drp] | Freq: Two times a day (BID) | OPHTHALMIC | 3 refills | Status: DC
  Filled 2023-01-28: qty 15, 90d supply, fill #0
  Filled 2023-06-28: qty 15, 150d supply, fill #0
  Filled 2023-07-04: qty 5, 50d supply, fill #0

## 2023-01-28 MED ORDER — DORZOLAMIDE HCL-TIMOLOL MAL 2-0.5 % OP SOLN
1.0000 [drp] | Freq: Two times a day (BID) | OPHTHALMIC | 3 refills | Status: DC
  Filled 2023-01-28: qty 10, 100d supply, fill #0
  Filled 2023-01-28: qty 20, 90d supply, fill #0
  Filled 2023-06-13: qty 10, 50d supply, fill #1
  Filled 2023-06-28: qty 10, 90d supply, fill #2
  Filled 2023-07-04 – 2023-07-11 (×2): qty 10, 50d supply, fill #2

## 2023-01-29 ENCOUNTER — Other Ambulatory Visit (HOSPITAL_BASED_OUTPATIENT_CLINIC_OR_DEPARTMENT_OTHER): Payer: Self-pay

## 2023-02-18 ENCOUNTER — Other Ambulatory Visit (HOSPITAL_BASED_OUTPATIENT_CLINIC_OR_DEPARTMENT_OTHER): Payer: Self-pay

## 2023-02-20 ENCOUNTER — Emergency Department (HOSPITAL_COMMUNITY): Payer: Medicare HMO

## 2023-02-20 ENCOUNTER — Encounter (HOSPITAL_COMMUNITY): Payer: Self-pay

## 2023-02-20 ENCOUNTER — Emergency Department (HOSPITAL_COMMUNITY)
Admission: EM | Admit: 2023-02-20 | Discharge: 2023-02-20 | Disposition: A | Discharge status: Home / Self Care | Payer: Medicare HMO | Attending: Emergency Medicine | Admitting: Emergency Medicine

## 2023-02-20 DIAGNOSIS — W19XXXA Unspecified fall, initial encounter: Secondary | ICD-10-CM | POA: Insufficient documentation

## 2023-02-20 DIAGNOSIS — R42 Dizziness and giddiness: Secondary | ICD-10-CM | POA: Diagnosis not present

## 2023-02-20 DIAGNOSIS — M19041 Primary osteoarthritis, right hand: Secondary | ICD-10-CM | POA: Diagnosis not present

## 2023-02-20 DIAGNOSIS — S3993XA Unspecified injury of pelvis, initial encounter: Secondary | ICD-10-CM | POA: Diagnosis not present

## 2023-02-20 DIAGNOSIS — R5383 Other fatigue: Secondary | ICD-10-CM | POA: Insufficient documentation

## 2023-02-20 DIAGNOSIS — Z7902 Long term (current) use of antithrombotics/antiplatelets: Secondary | ICD-10-CM | POA: Diagnosis not present

## 2023-02-20 DIAGNOSIS — S6991XA Unspecified injury of right wrist, hand and finger(s), initial encounter: Secondary | ICD-10-CM | POA: Diagnosis not present

## 2023-02-20 DIAGNOSIS — R109 Unspecified abdominal pain: Secondary | ICD-10-CM | POA: Diagnosis not present

## 2023-02-20 DIAGNOSIS — S0990XA Unspecified injury of head, initial encounter: Secondary | ICD-10-CM | POA: Diagnosis not present

## 2023-02-20 DIAGNOSIS — R059 Cough, unspecified: Secondary | ICD-10-CM | POA: Insufficient documentation

## 2023-02-20 DIAGNOSIS — M19031 Primary osteoarthritis, right wrist: Secondary | ICD-10-CM | POA: Diagnosis not present

## 2023-02-20 DIAGNOSIS — R079 Chest pain, unspecified: Secondary | ICD-10-CM | POA: Diagnosis present

## 2023-02-20 DIAGNOSIS — J9811 Atelectasis: Secondary | ICD-10-CM | POA: Diagnosis not present

## 2023-02-20 DIAGNOSIS — R519 Headache, unspecified: Secondary | ICD-10-CM | POA: Diagnosis not present

## 2023-02-20 DIAGNOSIS — I6782 Cerebral ischemia: Secondary | ICD-10-CM | POA: Diagnosis not present

## 2023-02-20 DIAGNOSIS — S299XXA Unspecified injury of thorax, initial encounter: Secondary | ICD-10-CM | POA: Diagnosis not present

## 2023-02-20 DIAGNOSIS — Z96643 Presence of artificial hip joint, bilateral: Secondary | ICD-10-CM | POA: Diagnosis not present

## 2023-02-20 DIAGNOSIS — R0789 Other chest pain: Secondary | ICD-10-CM | POA: Diagnosis not present

## 2023-02-20 DIAGNOSIS — I700 Atherosclerosis of aorta: Secondary | ICD-10-CM | POA: Diagnosis not present

## 2023-02-20 DIAGNOSIS — M85841 Other specified disorders of bone density and structure, right hand: Secondary | ICD-10-CM | POA: Diagnosis not present

## 2023-02-20 DIAGNOSIS — M542 Cervicalgia: Secondary | ICD-10-CM | POA: Insufficient documentation

## 2023-02-20 DIAGNOSIS — I491 Atrial premature depolarization: Secondary | ICD-10-CM | POA: Diagnosis not present

## 2023-02-20 DIAGNOSIS — I499 Cardiac arrhythmia, unspecified: Secondary | ICD-10-CM | POA: Diagnosis not present

## 2023-02-20 DIAGNOSIS — S3991XA Unspecified injury of abdomen, initial encounter: Secondary | ICD-10-CM | POA: Diagnosis not present

## 2023-02-20 DIAGNOSIS — G319 Degenerative disease of nervous system, unspecified: Secondary | ICD-10-CM | POA: Diagnosis not present

## 2023-02-20 DIAGNOSIS — S199XXA Unspecified injury of neck, initial encounter: Secondary | ICD-10-CM | POA: Diagnosis not present

## 2023-02-20 LAB — I-STAT CHEM 8, ED
BUN: 16 mg/dL (ref 8–23)
Calcium, Ion: 1.11 mmol/L — ABNORMAL LOW (ref 1.15–1.40)
Chloride: 100 mmol/L (ref 98–111)
Creatinine, Ser: 1 mg/dL (ref 0.61–1.24)
Glucose, Bld: 97 mg/dL (ref 70–99)
HCT: 38 % — ABNORMAL LOW (ref 39.0–52.0)
Hemoglobin: 12.9 g/dL — ABNORMAL LOW (ref 13.0–17.0)
Potassium: 4.2 mmol/L (ref 3.5–5.1)
Sodium: 134 mmol/L — ABNORMAL LOW (ref 135–145)
TCO2: 22 mmol/L (ref 22–32)

## 2023-02-20 LAB — COMPREHENSIVE METABOLIC PANEL
ALT: 12 U/L (ref 0–44)
AST: 22 U/L (ref 15–41)
Albumin: 3.5 g/dL (ref 3.5–5.0)
Alkaline Phosphatase: 91 U/L (ref 38–126)
Anion gap: 10 (ref 5–15)
BUN: 15 mg/dL (ref 8–23)
CO2: 22 mmol/L (ref 22–32)
Calcium: 8.7 mg/dL — ABNORMAL LOW (ref 8.9–10.3)
Chloride: 102 mmol/L (ref 98–111)
Creatinine, Ser: 1 mg/dL (ref 0.61–1.24)
GFR, Estimated: >60 mL/min (ref >60)
Glucose, Bld: 103 mg/dL — ABNORMAL HIGH (ref 70–99)
Potassium: 4.3 mmol/L (ref 3.5–5.1)
Sodium: 134 mmol/L — ABNORMAL LOW (ref 135–145)
Total Bilirubin: 0.9 mg/dL (ref <1.2)
Total Protein: 5.9 g/dL — ABNORMAL LOW (ref 6.5–8.1)

## 2023-02-20 LAB — CBC
HCT: 39.4 % (ref 39.0–52.0)
Hemoglobin: 13.1 g/dL (ref 13.0–17.0)
MCH: 31.5 pg (ref 26.0–34.0)
MCHC: 33.2 g/dL (ref 30.0–36.0)
MCV: 94.7 fL (ref 80.0–100.0)
Platelets: 121 10*3/uL — ABNORMAL LOW (ref 150–400)
RBC: 4.16 MIL/uL — ABNORMAL LOW (ref 4.22–5.81)
RDW: 14.6 % (ref 11.5–15.5)
WBC: 6.3 10*3/uL (ref 4.0–10.5)
nRBC: 0 % (ref 0.0–0.2)

## 2023-02-20 LAB — RESP PANEL BY RT-PCR (RSV, FLU A&B, COVID)  RVPGX2
Influenza A by PCR: NEGATIVE
Influenza B by PCR: NEGATIVE
Resp Syncytial Virus by PCR: NEGATIVE
SARS Coronavirus 2 by RT PCR: NEGATIVE

## 2023-02-20 LAB — PROTIME-INR
INR: 1.1 (ref 0.8–1.2)
Prothrombin Time: 14.1 s (ref 11.4–15.2)

## 2023-02-20 LAB — I-STAT CG4 LACTIC ACID, ED: Lactic Acid, Venous: 1.6 mmol/L (ref 0.5–1.9)

## 2023-02-20 LAB — SAMPLE TO BLOOD BANK

## 2023-02-20 LAB — ETHANOL: Alcohol, Ethyl (B): <10 mg/dL (ref <10)

## 2023-02-20 MED ORDER — IOHEXOL 350 MG/ML SOLN
75.0000 mL | Freq: Once | INTRAVENOUS | Status: AC | PRN
  Administered 2023-02-20: 75 mL via INTRAVENOUS

## 2023-02-20 MED ORDER — ACETAMINOPHEN 500 MG PO TABS
500.0000 mg | ORAL_TABLET | Freq: Once | ORAL | Status: AC
  Administered 2023-02-20: 500 mg via ORAL
  Filled 2023-02-20: qty 1

## 2023-02-20 NOTE — Progress Notes (Signed)
   02/20/23 1747  Spiritual Encounters  Type of Visit Initial  Care provided to: Patient  Conversation partners present during encounter Nurse  Referral source Trauma page  Reason for visit Trauma  OnCall Visit Yes  Spiritual Framework  Presenting Themes Values and beliefs;Impactful experiences and emotions  Values/beliefs Patient is [name] and expressed faith in Jesus  Patient Stress Factors Health changes  Family Stress Factors Not reviewed  Interventions  Spiritual Care Interventions Made Established relationship of care and support;Compassionate presence;Reflective listening;Encouragement  Intervention Outcomes  Outcomes Reduced isolation;Reduced anxiety;Awareness of support;Connection to spiritual care  Spiritual Care Plan  Spiritual Care Issues Still Outstanding No further spiritual care needs at this time (see row info)   Chaplain offered compassionate presence, reflective listening and hospitality to the patient. Patient requested the chaplain to see if "[name], the lady that checks him out" was in the waiting room. No family or friends were present. Chaplain informed patient that no one had arrived in the waiting are yet. No further needs at this time.   Arlyce Dice, Chaplain Resident

## 2023-02-20 NOTE — ED Triage Notes (Signed)
Pt BIB [facility] from home as a level 2 trauma ], fall on blood thinner. Pt stated he got up to use the restroom, felt dizzy then fell. Did hit his head. Pt is on Plavix. A&O X4. VSS. No obvious injuries noted.

## 2023-02-20 NOTE — ED Provider Notes (Signed)
[place] EMERGENCY DEPARTMENT AT [facility] Provider Note   CSN: [id number] Arrival date & time: 02/20/23  1721     History {Add pertinent medical, surgical, social history, OB history to HPI:1} Chief Complaint  Patient presents with   level 2 trauma     [name] is a 89 y.o. male.  HPI     Home Medications Prior to Admission medications   Medication Sig Start Date End Date Taking? Authorizing Provider  acetaminophen (TYLENOL) 500 MG tablet Take 500 mg by mouth daily as needed for mild pain. And take two by mouth twice daily at 6 am and 4:30 pm for pain Patient not taking: Reported on 11/28/2022    [provider]  ALPHAGAN P 0.1 % SOLN Instill 1 drop into both eyes twice a day 01/28/23     brimonidine (ALPHAGAN P) 0.1 % SOLN Place 1 drop into both eyes 2 (two) times daily.  08/11/15   [provider]  brimonidine (ALPHAGAN P) 0.1 % SOLN Place 1 drop into both eyes 2 (two) times daily. 12/25/22   Ernesto Rutherford, MD  clopidogrel (PLAVIX) 75 MG tablet Take 1 tablet (75 mg total) by mouth daily. 12/31/22   Marinus Maw, MD  diazepam (VALIUM) 5 MG tablet Take 5 mg by mouth as needed. 12/03/22   [provider]  diazepam (VALIUM) 5 MG tablet Take 1 tablet (5 mg total) by mouth daily as needed. 01/28/23     dorzolamide-timolol (COSOPT) 2-0.5 % ophthalmic solution Place 1 drop into both eyes 2 (two) times daily. 01/28/23     dorzolamide-timolol (COSOPT) 22.3-6.8 MG/ML ophthalmic solution Place 1 drop into both eyes 2 (two) times daily.  11/12/13   [provider]  esomeprazole (NEXIUM) 40 MG capsule Take 40 mg by mouth daily as needed (for acid reflux). 03/12/22   [provider]  latanoprost (XALATAN) 0.005 % ophthalmic solution Place 1 drop into both eyes at bedtime. 11/05/22   [provider]  latanoprost (XALATAN) 0.005 % ophthalmic solution Place 1 drop into both eyes at bedtime. 01/28/23     nitroGLYCERIN  (NITROSTAT) 0.4 MG SL tablet Place 1 tablet (0.4 mg total) under the tongue every 5 (five) minutes as needed for chest pain. 03/08/21   Medina-Vargas, Monina C, NP  potassium chloride (KLOR-CON M) 10 MEQ tablet Take 10 mEq by mouth daily. 11/14/22   [provider]  Probiotic Product (PROBIOTIC PO) Take 1 capsule by mouth daily.    [provider]  tamsulosin (FLOMAX) 0.4 MG CAPS capsule Take 1 capsule (0.4 mg total) by mouth daily. Patient taking differently: Take 0.4 mg by mouth in the morning and at bedtime. 03/08/21   Medina-Vargas, Monina C, NP  tamsulosin (FLOMAX) 0.4 MG CAPS capsule Take 1 capsule (0.4 mg total) by mouth at bedtime. 01/02/23     torsemide (DEMADEX) 20 MG tablet Take 1 tablet (20 mg) daily except on Monday, Wednesday, and Friday take 1/2 tablet (10mg ) 11/29/22   Tereso Newcomer T, PA-C  traMADol (ULTRAM) 50 MG tablet Take 50 mg by mouth 2 (two) times daily as needed. 10/31/22   [provider]      Allergies    Gabapentin, Parecoxib sodium [parecoxib], Codeine, and Warfarin sodium    Review of Systems   Review of Systems  Physical Exam Updated Vital Signs BP 112/72 (BP Location: Right Arm)  Physical Exam  ED Results / Procedures / Treatments   Labs (all labs ordered are listed,  but only abnormal results are displayed) Labs Reviewed - No data to display  EKG None  Radiology No results found.  Procedures Procedures  {Document cardiac monitor, telemetry assessment procedure when appropriate:1}  Medications Ordered in ED Medications - No data to display  ED Course/ Medical Decision Making/ A&P   {   Click here for ABCD2, HEART and other calculatorsREFRESH Note before signing :1}                              Medical Decision Making   [name] is a 89 y.o. male with a past medical history significant for atrial fibrillation, CHF, previous prostate cancer, pacemaker, Plavix use, and CAD who presents as a level 2 trauma for fall  on Plavix.  According to patient, he was getting some soup and went from the kitchen to the bathroom when he had a fall.  He hit his right forehead/temporal area and was having some mild pain.  He also is having pain in his neck, chest, abdomen, and back.  He reports the pain is mainly from the fall but he has had some fatigue and cough for the last few days.  EMS reported vital signs were reassuring and route and patient had no focal neurologic deficits.  They report he has mental status baseline.  On arrival, airway is intact.  Breath sounds equal bilaterally.  Chest is slightly tender and abdomen slight tender.  Pelvis was nontender.  He had tenderness in his right forearm, right wrist, and right hand with some bruising on the anatomic snuffbox.  He had intact sensation and strength however.  Good pulses in all extremities.  Legs are nontender and nonedematous.  Back had mild tenderness paraspinally and neck was tender in his c-collar.  Pupils are symmetric and reactive and speech was clear.  Normal extract movements.  No laceration to the head seen.  Patient will get portable chest and pelvis and will get x-rays of his right forearm, right wrist, and right hand.  Will get CT imaging of his head, neck, and chest/abdomen/pelvis to look for traumatic injuries.  Will check for COVID given his cough.  Anticipate reassessment after workup to determine disposition.        {Document critical care time when appropriate:1} {Document review of labs and clinical decision tools ie heart score, Chads2Vasc2 etc:1}  {Document your independent review of radiology images, and any outside records:1} {Document your discussion with family members, caretakers, and with consultants:1} {Document social determinants of health affecting pt's care:1} {Document your decision making why or why not admission, treatments were needed:1} Final Clinical Impression(s) / ED Diagnoses Final diagnoses:  None    Rx / DC  Orders ED Discharge Orders     None

## 2023-02-20 NOTE — Discharge Instructions (Signed)
Your history, exam, workup today did not show evidence of significant traumatic injury in the fall.  As we discussed, your CT of the head did show some bony abnormalities and they recommend outpatient follow-up and possible outpatient MRI.  Please follow-up with your regular doctor for this.  Please rest and stay hydrated.  If any symptoms change or worsen acutely, please turn to the nearest emergency department.

## 2023-02-21 DIAGNOSIS — W19XXXA Unspecified fall, initial encounter: Secondary | ICD-10-CM | POA: Diagnosis not present

## 2023-02-21 DIAGNOSIS — M791 Myalgia, unspecified site: Secondary | ICD-10-CM | POA: Diagnosis not present

## 2023-02-25 ENCOUNTER — Ambulatory Visit: Payer: Medicare HMO | Attending: Internal Medicine

## 2023-02-25 DIAGNOSIS — I5042 Chronic combined systolic (congestive) and diastolic (congestive) heart failure: Secondary | ICD-10-CM

## 2023-02-25 DIAGNOSIS — Z950 Presence of cardiac pacemaker: Secondary | ICD-10-CM | POA: Diagnosis not present

## 2023-02-26 ENCOUNTER — Other Ambulatory Visit (HOSPITAL_BASED_OUTPATIENT_CLINIC_OR_DEPARTMENT_OTHER): Payer: Self-pay

## 2023-02-26 MED ORDER — DORZOLAMIDE HCL-TIMOLOL MAL 2-0.5 % OP SOLN
1.0000 [drp] | Freq: Two times a day (BID) | OPHTHALMIC | 3 refills | Status: DC
  Filled 2023-02-26: qty 10, 50d supply, fill #0
  Filled 2023-03-04: qty 30, 300d supply, fill #0

## 2023-02-26 MED ORDER — LATANOPROST 0.005 % OP SOLN
1.0000 [drp] | Freq: Every day | OPHTHALMIC | 3 refills | Status: DC
  Filled 2023-02-26: qty 7.5, 75d supply, fill #0
  Filled 2023-03-04: qty 7.5, 150d supply, fill #0

## 2023-02-26 MED ORDER — ALPHAGAN P 0.1 % OP SOLN
1.0000 [drp] | Freq: Two times a day (BID) | OPHTHALMIC | 3 refills | Status: DC
  Filled 2023-02-26 – 2023-02-28 (×2): qty 15, 75d supply, fill #0
  Filled 2023-03-04: qty 5, 30d supply, fill #0
  Filled 2023-03-14: qty 5, 50d supply, fill #0
  Filled 2023-06-13: qty 5, 50d supply, fill #1

## 2023-02-28 ENCOUNTER — Other Ambulatory Visit (HOSPITAL_BASED_OUTPATIENT_CLINIC_OR_DEPARTMENT_OTHER): Payer: Self-pay

## 2023-02-28 ENCOUNTER — Other Ambulatory Visit (HOSPITAL_COMMUNITY): Payer: Self-pay

## 2023-03-04 ENCOUNTER — Other Ambulatory Visit (HOSPITAL_BASED_OUTPATIENT_CLINIC_OR_DEPARTMENT_OTHER): Payer: Self-pay

## 2023-03-04 ENCOUNTER — Other Ambulatory Visit: Payer: Self-pay

## 2023-03-04 MED ORDER — BRIMONIDINE TARTRATE 0.2 % OP SOLN
1.0000 [drp] | Freq: Two times a day (BID) | OPHTHALMIC | 11 refills | Status: DC
  Filled 2023-03-04: qty 10, 30d supply, fill #0
  Filled 2023-03-14: qty 5, 50d supply, fill #0
  Filled 2023-06-13: qty 5, 25d supply, fill #1
  Filled 2023-06-28: qty 5, 50d supply, fill #2
  Filled 2023-07-04: qty 5, 25d supply, fill #2
  Filled 2023-08-14: qty 5, 25d supply, fill #3

## 2023-03-13 ENCOUNTER — Encounter: Payer: Self-pay | Admitting: Internal Medicine

## 2023-03-13 ENCOUNTER — Other Ambulatory Visit (HOSPITAL_BASED_OUTPATIENT_CLINIC_OR_DEPARTMENT_OTHER): Payer: Self-pay

## 2023-03-13 ENCOUNTER — Ambulatory Visit: Payer: Medicare HMO | Attending: Internal Medicine | Admitting: Internal Medicine

## 2023-03-13 VITALS — BP 90/60 | HR 84 | Ht 67.0 in | Wt 180.0 lb

## 2023-03-13 DIAGNOSIS — I5042 Chronic combined systolic (congestive) and diastolic (congestive) heart failure: Secondary | ICD-10-CM

## 2023-03-13 DIAGNOSIS — I4821 Permanent atrial fibrillation: Secondary | ICD-10-CM

## 2023-03-13 DIAGNOSIS — R5381 Other malaise: Secondary | ICD-10-CM

## 2023-03-13 DIAGNOSIS — R531 Weakness: Secondary | ICD-10-CM

## 2023-03-13 LAB — CUP PACEART INCLINIC DEVICE CHECK
Battery Remaining Longevity: 8 mo
Battery Voltage: 2.83 V
Brady Statistic RA Percent Paced: 0 %
Brady Statistic RV Percent Paced: 78 %
Date Time Interrogation Session: 20250115162059
Implantable Lead Connection Status: 753985
Implantable Lead Connection Status: 753985
Implantable Lead Connection Status: 753985
Implantable Lead Implant Date: 20161205
Implantable Lead Implant Date: 20161205
Implantable Lead Implant Date: 20161205
Implantable Lead Location: 753858
Implantable Lead Location: 753859
Implantable Lead Location: 753860
Implantable Pulse Generator Implant Date: 20161205
Lead Channel Impedance Value: 460 Ohm
Lead Channel Impedance Value: 462.5 Ohm
Lead Channel Impedance Value: 650 Ohm
Lead Channel Impedance Value: 987.5 Ohm
Lead Channel Impedance Value: 990 Ohm
Lead Channel Pacing Threshold Amplitude: 0.75 V
Lead Channel Pacing Threshold Amplitude: 0.75 V
Lead Channel Pacing Threshold Amplitude: 0.75 V
Lead Channel Pacing Threshold Amplitude: 0.75 V
Lead Channel Pacing Threshold Pulse Width: 0.5 ms
Lead Channel Pacing Threshold Pulse Width: 0.5 ms
Lead Channel Pacing Threshold Pulse Width: 0.8 ms
Lead Channel Pacing Threshold Pulse Width: 0.8 ms
Lead Channel Sensing Intrinsic Amplitude: 12 mV
Lead Channel Sensing Intrinsic Amplitude: 2 mV
Lead Channel Setting Pacing Amplitude: 1.75 V
Lead Channel Setting Pacing Amplitude: 2.5 V
Lead Channel Setting Pacing Pulse Width: 0.5 ms
Lead Channel Setting Pacing Pulse Width: 0.8 ms
Lead Channel Setting Sensing Sensitivity: 2 mV
Pulse Gen Model: 3262
Pulse Gen Serial Number: 7802901

## 2023-03-13 MED ORDER — TRAMADOL HCL 50 MG PO TABS
50.0000 mg | ORAL_TABLET | Freq: Two times a day (BID) | ORAL | 2 refills | Status: DC | PRN
  Filled 2023-03-13 – 2023-03-14 (×2): qty 60, 30d supply, fill #0

## 2023-03-13 MED ORDER — DIAZEPAM 5 MG PO TABS
5.0000 mg | ORAL_TABLET | Freq: Every day | ORAL | 2 refills | Status: DC | PRN
  Filled 2023-03-13 – 2023-03-14 (×2): qty 30, 30d supply, fill #0
  Filled 2023-05-06: qty 30, 30d supply, fill #1
  Filled 2023-06-11: qty 30, 30d supply, fill #2

## 2023-03-13 NOTE — Patient Instructions (Signed)
 Medication Instructions:  Your physician recommends that you continue on your current medications as directed. Please refer to the Current Medication list given to you today.  *If you need a refill on your cardiac medications before your next appointment, please call your pharmacy*  Lab Work: TSH, Free T4, Sed rate, CBC, BMET, Liver test --- TODAY  If you have labs (blood work) drawn today and your tests are completely normal, you will receive your results only by: MyChart Message (if you have MyChart) OR A paper copy in the mail If you have any lab test that is abnormal or we need to change your treatment, we will call you to review the results.  Testing/Procedures: None ordered.  Follow-Up: At [facility], you and your health needs are our priority.  As part of our continuing mission to provide you with exceptional heart care, we have created designated Provider Care Teams.  These Care Teams include your primary Cardiologist (physician) and Advanced Practice Providers (APPs -  Physician Assistants and Nurse Practitioners) who all work together to provide you with the care you need, when you need it.  We recommend signing up for the patient portal called "MyChart".  Sign up information is provided on this After Visit Summary.  MyChart is used to connect with patients for Virtual Visits (Telemedicine).  Patients are able to view lab/test results, encounter notes, upcoming appointments, etc.  Non-urgent messages can be sent to your provider as well.   To learn more about what you can do with MyChart, go to [email or web address].    Your next appointment:   To be determined based on testing  The format for your next appointment:   In Person  Provider:   Manya Sells, MD{or one of the following Advanced Practice Providers on your designated Care Team:   Mertha Abrahams, [place] Bambi Lever "Jonelle Neri" [place], [place] Neda Balk, NP  Remote monitoring is used to monitor your Pacemaker/ ICD from  home. This monitoring reduces the number of office visits required to check your device to one time per year. It allows us  to keep an eye on the functioning of your device to ensure it is working properly.   Important Information About Sugar

## 2023-03-13 NOTE — Progress Notes (Signed)
 HPI [name] returns today for followup. He is a pleasant elderly man with symptomatic bradycardia, chronic systolic heart failure with near normalization of his LV function, lung disease, HTN, and is s/p Biv PPM insertion. In the interim, he has been weak. He notes that his strength is markedly reduced.  He denies chest pain. His chf symptoms are class 2. No syncope. He has become a bit more sedentary. He has a h/o GI bleeding and has not been anti-coagulated.  Allergies  Allergen Reactions   Gabapentin     Other reaction(s): mood changes   Parecoxib Sodium [Parecoxib]    Codeine Anxiety and Other (See Comments)    Insomnia/ hyper   Warfarin Sodium Anxiety and Other (See Comments)    Hx GI bleed     Current Outpatient Medications  Medication Sig Dispense Refill   acetaminophen  (TYLENOL ) 500 MG tablet Take 500 mg by mouth daily as needed for mild pain (pain score 1-3). And take two by mouth twice daily at 6 am and 4:30 pm for pain     ALPHAGAN  P 0.1 % SOLN Instill 1 drop into both eyes twice a day 15 mL 3   ALPHAGAN  P 0.1 % SOLN Instill 1 drop into both eyes twice a day 15 mL 3   brimonidine  (ALPHAGAN ) 0.2 % ophthalmic solution Place 1 drop into both eyes 2 (two) times daily. 10 mL 11   clopidogrel  (PLAVIX ) 75 MG tablet Take 1 tablet (75 mg total) by mouth daily. 90 tablet 3   diazepam  (VALIUM ) 5 MG tablet Take 1 tablet (5 mg total) by mouth daily as needed. 30 tablet 2   dorzolamide -timolol  (COSOPT ) 2-0.5 % ophthalmic solution Place 1 drop into both eyes 2 (two) times daily. 30 mL 3   dorzolamide -timolol  (COSOPT ) 2-0.5 % ophthalmic solution Instill 1 drop into both eyes twice a day 30 mL 3   esomeprazole  (NEXIUM ) 40 MG capsule Take 40 mg by mouth daily as needed (for acid reflux).     latanoprost  (XALATAN ) 0.005 % ophthalmic solution Place 1 drop into both eyes at bedtime. 7.5 mL 3   latanoprost  (XALATAN ) 0.005 % ophthalmic solution Place 1 drop into both eyes at bedtime. 7.5  mL 3   nitroGLYCERIN  (NITROSTAT ) 0.4 MG SL tablet Place 1 tablet (0.4 mg total) under the tongue every 5 (five) minutes as needed for chest pain. 20 tablet 0   potassium chloride  (KLOR-CON  M) 10 MEQ tablet Take 10 mEq by mouth daily.     Probiotic Product (PROBIOTIC PO) Take 1 capsule by mouth daily.     tamsulosin  (FLOMAX ) 0.4 MG CAPS capsule Take 1 capsule (0.4 mg total) by mouth at bedtime. 90 capsule 3   torsemide  (DEMADEX ) 20 MG tablet Take 1 tablet (20 mg) daily except on Monday, Wednesday, and Friday take 1/2 tablet (10mg ) 90 tablet 3   traMADol  (ULTRAM ) 50 MG tablet Take 50 mg by mouth 2 (two) times daily as needed.     traMADol  (ULTRAM ) 50 MG tablet Take 1 tablet (50 mg total) by mouth 2 (two) times daily as needed. 60 tablet 2   No current facility-administered medications for this visit.     Past Medical History:  Diagnosis Date   Anxiety    Arthritis    Atrial fibrillation, permanent (HCC) 01/07/2016   Blood transfusion    "related to a surgery"   BPH (benign prostatic hypertrophy) with urinary obstruction    s/p turp yrs ago   Bradycardia  a. Amio d/c'd 08/2013; brady arrest 08/2013 after PCI >>> recurrent AF >>> Amiodarone  restarted. b. Pacemaker being considered in 11/2014.   CAD (coronary artery disease)    a. s/p MI and prior PCI of LAD;  b. LHC (08/2013):  prox LAD 60-70%, mid LAD stents ok, ostial lesion at Dx jailed by stent, mild CFX and RCA disesase >>>  PCI (09/08/13):  rotational atherectomy + Promus DES to prox LAD   Cardiomyopathy [facility])    a. Echo (08/2013):  EF 30-35%, AS hypokinesis, Gr 1 diast dysfn, mild MR, mild LAE >>> b. improved EF 50-55% by echo 8/15. c. EF down again by echo 12/2014 to 30-35% but 51% by nuc.   Chronic lower back pain    Chronic systolic CHF (congestive heart failure) (HCC)    CKD (chronic kidney disease), stage III (HCC)    a. Per review of labs baseline Cr 1.1-1.3.   DDD (degenerative disc disease)    chronic back pain   Depression     Diverticulitis of colon with bleeding    s/p sigmoid resection '88   DJD (degenerative joint disease) hips and knees   s/p bilateral total replacements   GERD (gastroesophageal reflux disease)    occ. take prevacid   Glaucoma    Hearing impaired person, right    History of GI diverticular bleed april 2012   transfused blood and resolved without surgical intervention   Hypertension    Impaired hearing bilateral    only left hearing aid   Impotence, organic    s/p penile prosthesis 1990's   Incomplete bladder emptying    LBBB (left bundle branch block)    Meningioma (HCC) right -sided w/ right VI palsy   followed by dr Darlys Eland   Mitral regurgitation    a. Mild-mod by echo 12/2014.   Myocardial infarction (HCC) 1980's- medical intervention   "so mild I didn't know I'd had it"   PAF (paroxysmal atrial fibrillation) (HCC)    not on coumadin due to hx of GI bleed.   Prostate cancer (HCC) 11/30/13   Gleason 8, volume 22.14 cc   Sleep apnea    non-compliant cpap   Uses hearing aid    left    Vitreous hemorrhage (HCC)    related to branch retinal vein occlusion left eye   Wears glasses     ROS:   All systems reviewed and negative except as noted in the HPI.   Past Surgical History:  Procedure Laterality Date   APPENDECTOMY     CARDIAC CATHETERIZATION  2007   noncritical cad (results w/ chart)   CARDIOVERSION N/A 10/13/2015   Procedure: CARDIOVERSION;  Surgeon: Loyde Rule, MD;  Location: [facility] ENDOSCOPY;  Service: Cardiovascular;  Laterality: N/A;   CATARACT EXTRACTION W/ INTRAOCULAR LENS  IMPLANT, BILATERAL Bilateral ~ 2000   CHOLECYSTECTOMY     CLOSED REDUCTION HIP DISLOCATION Right "several"   CORONARY ANGIOPLASTY WITH STENT PLACEMENT  08-03-08   drug-eluting stent x2 distal and mid lad   EP IMPLANTABLE DEVICE N/A 01/31/2015   Procedure: BiV Pacemaker Insertion CRT-P;  Surgeon: Tammie Fall, MD;  Location: [facility] INVASIVE CV LAB;  Service: Cardiovascular;  Laterality:  N/A;   EP IMPLANTABLE DEVICE N/A 02/07/2015   Procedure: Lead Revision/Repair;  Surgeon: Verona Goodwill, MD;  Location: [facility] INVASIVE CV LAB;  Service: Cardiovascular;  Laterality: N/A;   ESOPHAGOGASTRODUODENOSCOPY N/A 02/11/2014   Procedure: ESOPHAGOGASTRODUODENOSCOPY (EGD);  Surgeon: Brice Campi, MD;  Location: [facility] ENDOSCOPY;  Service:  Endoscopy;  Laterality: N/A;   gamma knife radiation  2000   [facility] for meningioma, last eval 2013- no change   GAS INSERTION Left 07/14/2019   Procedure: INSERTION OF GAS;  Surgeon: Rexene Catching, MD;  Location: [facility] OR;  Service: Ophthalmology;  Laterality: Left;   INGUINAL HERNIA REPAIR Bilateral    INNER EAR SURGERY Right yrs ago   "trying to get my hearing back   LEFT HEART CATHETERIZATION WITH CORONARY ANGIOGRAM N/A 09/04/2013   Procedure: LEFT HEART CATHETERIZATION WITH CORONARY ANGIOGRAM;  Surgeon: Lucendia Rusk, MD;  Location: [facility] CATH LAB;  Service: Cardiovascular;  Laterality: N/A;   PARS PLANA VITRECTOMY Left 07/14/2019   27 GAUGE   PARS PLANA VITRECTOMY 27 GAUGE Left 07/14/2019   Procedure: PARS PLANA VITRECTOMY 27 GAUGE;  Surgeon: Rexene Catching, MD;  Location: [facility] OR;  Service: Ophthalmology;  Laterality: Left;   PENILE PROSTHESIS IMPLANT  1990's   PERCUTANEOUS CORONARY ROTOBLATOR INTERVENTION (PCI-R) N/A 09/08/2013   Procedure: PERCUTANEOUS CORONARY ROTOBLATOR INTERVENTION (PCI-R);  Surgeon: Lucendia Rusk, MD;  Location: [facility] CATH LAB;  Service: Cardiovascular;  Laterality: N/A;   PHOTOCOAGULATION WITH LASER Left 07/14/2019   Procedure: PHOTOCOAGULATION WITH LASER;  Surgeon: Rexene Catching, MD;  Location: [facility] OR;  Service: Ophthalmology;  Laterality: Left;   PROSTATE BIOPSY  11/30/13   Gleason 8, vol 22.14 cc   REVISION TOTAL KNEE ARTHROPLASTY Right    SHOULDER OPEN ROTATOR CUFF REPAIR Left    TOTAL HIP ARTHROPLASTY Right 03-25-08--   TOTAL HIP ARTHROPLASTY Left 2005   TOTAL HIP REVISION Right 3-4 times   TOTAL KNEE ARTHROPLASTY  Right 2004   TOTAL KNEE ARTHROPLASTY Left 1997   TRANSURETHRAL RESECTION OF PROSTATE  "years ago"   TRANSURETHRAL RESECTION OF PROSTATE  01/08/2011   Procedure: TRANSURETHRAL RESECTION OF THE PROSTATE (TURP);  Surgeon: Trent Frizzle;  Location: [facility];  Service: Urology;  Laterality: N/A;  GYRUS      Family History  Problem Relation Age of Onset   Heart disease Mother    Heart attack Mother    Emphysema Father    Cancer Brother        liver cancer   Cancer Other      Social History   Socioeconomic History   Marital status: Widowed    Spouse name: Not on file   Number of children: Not on file   Years of education: Not on file   Highest education level: Not on file  Occupational History   Not on file  Tobacco Use   Smoking status: Former    Current packs/day: 0.00    Average packs/day: 1 pack/day for 14.0 years (14.0 ttl pk-yrs)    Types: Cigarettes    Start date: 01/04/1945    Quit date: 01/05/1959    Years since quitting: 64.2   Smokeless tobacco: Never  Vaping Use   Vaping status: Never Used  Substance and Sexual Activity   Alcohol use: Yes    Comment: social    Drug use: No   Sexual activity: Not Currently  Other Topics Concern   Not on file  Social History Narrative   Not on file   Social Drivers of Health   Financial Resource Strain: Not on file  Food Insecurity: Not on file  Transportation Needs: No Transportation Needs (05/10/2022)   PRAPARE - Administrator, Civil Service (Medical): No    Lack of Transportation (Non-Medical): No  Physical Activity: Not on file  Stress: Not on file  Social Connections: Not on file  Intimate Partner Violence: Not on file     BP 90/60 (BP Location: Left Arm, Patient Position: Sitting, Cuff Size: Normal)   Pulse 84   Ht 5\' 7"  (1.702 m)   Wt 180 lb (81.6 kg)   SpO2 94%   BMI 28.19 kg/m   Physical Exam:  Chronically ill appearing NAD HEENT: Unremarkable Neck:  6 cm JVD, no  thyromegally Lymphatics:  No adenopathy Back:  No CVA tenderness Lungs:  Clear with minimal rales HEART:  Regular rate rhythm, no murmurs, no rubs, no clicks Abd:  soft, positive bowel sounds, no organomegally, no rebound, no guarding Ext:  2 plus pulses, no edema, no cyanosis, no clubbing Skin:  No rashes no nodules Neuro:  CN II through XII intact, motor grossly intact  DEVICE  Normal device function.  See PaceArt for details.   Assess/Plan:  Acute on chronic systolic/diastolic heart failure - his symptoms remain class 2. He will continue his medical therapy. His [place] is up a bit. 2. Atrial fib/flutter- he is well controlled. He is Biv pacing 84% of the time. He is not anti-coagulated due to a h/o GI bleeding 3. PPM - his [employer] biv ppm is working normally. We will recheck in several months. He is programmed VVIR 4. HTN - his bp is well controlled. He usually runs a little lower.  5. Fatigue and weakness - he will have his TSH, ESR, liver panel, cbc and bmp checked. Additional recs based on the results of the labs.   Debbi Failing.D

## 2023-03-14 ENCOUNTER — Other Ambulatory Visit (HOSPITAL_BASED_OUTPATIENT_CLINIC_OR_DEPARTMENT_OTHER): Payer: Self-pay

## 2023-03-14 LAB — CBC
Hematocrit: 38.2 % (ref 37.5–51.0)
Hemoglobin: 12.9 g/dL — ABNORMAL LOW (ref 13.0–17.7)
MCH: 32.2 pg (ref 26.6–33.0)
MCHC: 33.8 g/dL (ref 31.5–35.7)
MCV: 95 fL (ref 79–97)
Platelets: 156 10*3/uL (ref 150–450)
RBC: 4.01 x10E6/uL — ABNORMAL LOW (ref 4.14–5.80)
RDW: 13.2 % (ref 11.6–15.4)
WBC: 6.3 10*3/uL (ref 3.4–10.8)

## 2023-03-14 LAB — T4, FREE: Free T4: 1.28 ng/dL (ref 0.82–1.77)

## 2023-03-14 LAB — COMPREHENSIVE METABOLIC PANEL
ALT: 15 [IU]/L (ref 0–44)
AST: 19 [IU]/L (ref 0–40)
Albumin: 4.1 g/dL (ref 3.6–4.6)
Alkaline Phosphatase: 111 [IU]/L (ref 44–121)
BUN/Creatinine Ratio: 14 (ref 10–24)
BUN: 15 mg/dL (ref 10–36)
Bilirubin Total: 0.4 mg/dL (ref 0.0–1.2)
CO2: 23 mmol/L (ref 20–29)
Calcium: 9 mg/dL (ref 8.6–10.2)
Chloride: 94 mmol/L — ABNORMAL LOW (ref 96–106)
Creatinine, Ser: 1.09 mg/dL (ref 0.76–1.27)
Globulin, Total: 1.9 g/dL (ref 1.5–4.5)
Glucose: 107 mg/dL — ABNORMAL HIGH (ref 70–99)
Potassium: 4.6 mmol/L (ref 3.5–5.2)
Sodium: 130 mmol/L — ABNORMAL LOW (ref 134–144)
Total Protein: 6 g/dL (ref 6.0–8.5)
eGFR: 63 mL/min/{1.73_m2} (ref >59)

## 2023-03-14 LAB — TSH: TSH: 1.55 u[IU]/mL (ref 0.450–4.500)

## 2023-03-14 LAB — HEPATIC FUNCTION PANEL: Bilirubin, Direct: 0.18 mg/dL (ref 0.00–0.40)

## 2023-03-14 LAB — SEDIMENTATION RATE: Sed Rate: 3 mm/h (ref 0–30)

## 2023-03-21 ENCOUNTER — Telehealth: Payer: Self-pay | Admitting: Cardiology

## 2023-03-21 NOTE — Telephone Encounter (Signed)
Spoke with patient about lab results. Pt stated he was having some continued weakness in his arms. Suggested to speak with PCP for further instruction.

## 2023-03-21 NOTE — Telephone Encounter (Signed)
 Patient is requesting call back in regards to results. Please advise.

## 2023-04-01 ENCOUNTER — Ambulatory Visit: Payer: Medicare HMO | Attending: Internal Medicine

## 2023-04-01 DIAGNOSIS — I5042 Chronic combined systolic (congestive) and diastolic (congestive) heart failure: Secondary | ICD-10-CM

## 2023-04-01 DIAGNOSIS — Z950 Presence of cardiac pacemaker: Secondary | ICD-10-CM | POA: Diagnosis not present

## 2023-04-03 ENCOUNTER — Ambulatory Visit: Payer: Medicare HMO

## 2023-04-03 DIAGNOSIS — I442 Atrioventricular block, complete: Secondary | ICD-10-CM | POA: Diagnosis not present

## 2023-04-04 ENCOUNTER — Telehealth: Payer: Self-pay

## 2023-04-04 NOTE — Telephone Encounter (Signed)
 Remote ICM transmission received.  Attempted call to patient regarding ICM remote transmission and left detailed message per DPR.  Left ICM phone number and advised to return call for any fluid symptoms or questions. Next ICM remote transmission scheduled 05/06/2023.

## 2023-04-05 LAB — CUP PACEART REMOTE DEVICE CHECK
Battery Remaining Longevity: 8 mo
Battery Remaining Percentage: 8 %
Battery Voltage: 2.83 V
Date Time Interrogation Session: 20250206093646
Implantable Lead Connection Status: 753985
Implantable Lead Connection Status: 753985
Implantable Lead Connection Status: 753985
Implantable Lead Implant Date: 20161205
Implantable Lead Implant Date: 20161205
Implantable Lead Implant Date: 20161205
Implantable Lead Location: 753858
Implantable Lead Location: 753859
Implantable Lead Location: 753860
Implantable Pulse Generator Implant Date: 20161205
Lead Channel Impedance Value: 1000 Ohm
Lead Channel Impedance Value: 480 Ohm
Lead Channel Pacing Threshold Amplitude: 0.75 V
Lead Channel Pacing Threshold Amplitude: 0.75 V
Lead Channel Pacing Threshold Pulse Width: 0.5 ms
Lead Channel Pacing Threshold Pulse Width: 0.8 ms
Lead Channel Sensing Intrinsic Amplitude: 12 mV
Lead Channel Setting Pacing Amplitude: 1.75 V
Lead Channel Setting Pacing Amplitude: 2.5 V
Lead Channel Setting Pacing Pulse Width: 0.5 ms
Lead Channel Setting Pacing Pulse Width: 0.8 ms
Lead Channel Setting Sensing Sensitivity: 2 mV
Pulse Gen Model: 3262
Pulse Gen Serial Number: 7802901

## 2023-04-12 ENCOUNTER — Telehealth: Payer: Self-pay

## 2023-04-12 NOTE — Telephone Encounter (Signed)
Return call as requested by voice mail message.  He asked if I received the report he sent 2/12.  He reports having trouble with his machine and someone called him and told him it is not working.  Advised report was received and it is connected.  He just wanted to make sure.

## 2023-04-19 DIAGNOSIS — H5203 Hypermetropia, bilateral: Secondary | ICD-10-CM | POA: Diagnosis not present

## 2023-05-06 ENCOUNTER — Other Ambulatory Visit: Payer: Self-pay

## 2023-05-06 ENCOUNTER — Ambulatory Visit: Payer: Medicare HMO | Attending: Internal Medicine

## 2023-05-06 DIAGNOSIS — I5042 Chronic combined systolic (congestive) and diastolic (congestive) heart failure: Secondary | ICD-10-CM | POA: Diagnosis not present

## 2023-05-06 DIAGNOSIS — Z950 Presence of cardiac pacemaker: Secondary | ICD-10-CM | POA: Diagnosis not present

## 2023-05-08 ENCOUNTER — Telehealth: Payer: Self-pay

## 2023-05-08 NOTE — Telephone Encounter (Signed)
 Remote ICM transmission received.  Attempted call to patient regarding ICM remote transmission and left detailed message per DPR.  Left ICM phone number and advised to return call for any fluid symptoms or questions. Next ICM remote transmission scheduled 06/10/2023.

## 2023-05-08 NOTE — Progress Notes (Signed)
 EPIC Encounter for ICM Monitoring  Patient Name: [name] is a 89 y.o. male Date: 05/08/2023 Primary Care Physican: Daisy Floro, MD Primary Cardiologist: Anne Fu Electrophysiologist: Ladona Ridgel  BiV Pacing: 76% 03/13/2023 Office Weight: 180 lbs   Battery Longevity: 7.1 months    Attempted call to patient and unable to reach.  Left detailed message per DPR regarding transmission.  Transmission results reviewed.     CorVue thoracic impedance suggesting normal fluid levels within the last month.   Prescribed:  Torsemide 20 mg Take 1 tablet (20 mg total) by mouth daily except on Monday, Wednesday and Friday take 0.5 tablet (10 mg total). Potassium 10 mEq take 1 tablet daily   Labs: 03/13/2023 Creatinine 1.09, BUN 15, Potassium 4.6, Sodium 130, GFR 63 11/28/2022 Creatinine 1.03, BUN 17, Potassium 4.5, Sodium 130, GFR 67 04/28/2022 Creatinine 1.21, BUN 19, Potassium 4.1, Sodium 133, GFR 56 A complete set of results can be found in Results Review.   Recommendations:  Left voice mail with ICM number and encouraged to call if experiencing any fluid symptoms.   Follow-up plan: ICM clinic phone appointment on 06/10/2023.  91 day device clinic remote transmission 07/03/2023.     EP/Cardiology Office Visits:    Recall 12/22/2023 with Dr Ladona Ridgel.   Next cardiology visit due 11/29/2023 with Dr Anne Fu or Tereso Newcomer, PA (no recall).   Copy of ICM check sent to Dr. Ladona Ridgel.    3 month ICM trend: 05/06/2023.    12-14 Month ICM trend:     Karie Soda, RN 05/08/2023 8:47 AM

## 2023-05-08 NOTE — Progress Notes (Signed)
 Pt returned call.  Heart failure questions reviewed.  Transmission results reviewed.  Pt asymptomatic for fluid accumulation.  He said he is not feeling great today but nothing to do with his heart.  No changes and encouraged to call if experiencing any fluid symptoms.

## 2023-05-10 NOTE — Progress Notes (Signed)
 Remote pacemaker transmission.

## 2023-05-10 NOTE — Addendum Note (Signed)
 Addended by: Elease Etienne A on: 05/10/2023 01:12 PM   Modules accepted: Orders

## 2023-05-14 ENCOUNTER — Other Ambulatory Visit: Payer: Self-pay

## 2023-05-14 ENCOUNTER — Other Ambulatory Visit (HOSPITAL_BASED_OUTPATIENT_CLINIC_OR_DEPARTMENT_OTHER): Payer: Self-pay

## 2023-05-14 DIAGNOSIS — H353131 Nonexudative age-related macular degeneration, bilateral, early dry stage: Secondary | ICD-10-CM | POA: Diagnosis not present

## 2023-05-14 DIAGNOSIS — H401112 Primary open-angle glaucoma, right eye, moderate stage: Secondary | ICD-10-CM | POA: Diagnosis not present

## 2023-05-14 DIAGNOSIS — H348322 Tributary (branch) retinal vein occlusion, left eye, stable: Secondary | ICD-10-CM | POA: Diagnosis not present

## 2023-05-14 DIAGNOSIS — H4312 Vitreous hemorrhage, left eye: Secondary | ICD-10-CM | POA: Diagnosis not present

## 2023-05-14 DIAGNOSIS — H43813 Vitreous degeneration, bilateral: Secondary | ICD-10-CM | POA: Diagnosis not present

## 2023-05-14 DIAGNOSIS — H401123 Primary open-angle glaucoma, left eye, severe stage: Secondary | ICD-10-CM | POA: Diagnosis not present

## 2023-05-14 DIAGNOSIS — H532 Diplopia: Secondary | ICD-10-CM | POA: Diagnosis not present

## 2023-05-14 MED ORDER — BRIMONIDINE TARTRATE 0.2 % OP SOLN
1.0000 [drp] | Freq: Two times a day (BID) | OPHTHALMIC | 3 refills | Status: DC
  Filled 2023-05-14: qty 5, 25d supply, fill #0

## 2023-05-25 ENCOUNTER — Other Ambulatory Visit (HOSPITAL_BASED_OUTPATIENT_CLINIC_OR_DEPARTMENT_OTHER): Payer: Self-pay

## 2023-05-27 DIAGNOSIS — I5020 Unspecified systolic (congestive) heart failure: Secondary | ICD-10-CM | POA: Diagnosis not present

## 2023-05-27 DIAGNOSIS — G311 Senile degeneration of brain, not elsewhere classified: Secondary | ICD-10-CM | POA: Diagnosis not present

## 2023-05-27 DIAGNOSIS — I4891 Unspecified atrial fibrillation: Secondary | ICD-10-CM | POA: Diagnosis not present

## 2023-05-27 DIAGNOSIS — N183 Chronic kidney disease, stage 3 unspecified: Secondary | ICD-10-CM | POA: Diagnosis not present

## 2023-05-30 ENCOUNTER — Encounter (INDEPENDENT_AMBULATORY_CARE_PROVIDER_SITE_OTHER): Payer: Medicare HMO | Admitting: Ophthalmology

## 2023-06-10 ENCOUNTER — Ambulatory Visit: Attending: Internal Medicine

## 2023-06-10 DIAGNOSIS — Z950 Presence of cardiac pacemaker: Secondary | ICD-10-CM | POA: Diagnosis not present

## 2023-06-10 DIAGNOSIS — I5042 Chronic combined systolic (congestive) and diastolic (congestive) heart failure: Secondary | ICD-10-CM | POA: Diagnosis not present

## 2023-06-10 NOTE — Progress Notes (Signed)
 EPIC Encounter for ICM Monitoring  Patient Name: [name] is a 89 y.o. male Date: 06/10/2023 Primary Care Physican: Jimmey Mould, MD Primary Cardiologist: Renna Cary Electrophysiologist: Carolynne Citron  BiV Pacing: 76% 03/13/2023 Office Weight: 180 lbs   Battery Longevity: 6.5 months    Spoke with patient and heart failure questions reviewed.  Transmission results reviewed.  Pt asymptomatic for fluid accumulation.  Reports feeling well at this time and voices no complaints.     CorVue thoracic impedance suggesting normal fluid levels within the last month.   Prescribed:  Torsemide 20 mg Take 1 tablet (20 mg total) by mouth daily except on Monday, Wednesday and Friday take 0.5 tablet (10 mg total). Potassium 10 mEq take 1 tablet daily   Labs: 03/13/2023 Creatinine 1.09, BUN 15, Potassium 4.6, Sodium 130, GFR 63 11/28/2022 Creatinine 1.03, BUN 17, Potassium 4.5, Sodium 130, GFR 67 04/28/2022 Creatinine 1.21, BUN 19, Potassium 4.1, Sodium 133, GFR 56 A complete set of results can be found in Results Review.   Recommendations:  No changes and encouraged to call if experiencing any fluid symptoms.   Follow-up plan: ICM clinic phone appointment on 07/15/2023.  91 day device clinic remote transmission 07/03/2023.     EP/Cardiology Office Visits:    Recall 12/22/2023 with Dr Carolynne Citron.   Next cardiology visit due 11/29/2023 with Dr Renna Cary or Marlyse Single, PA (no recall).   Copy of ICM check sent to Dr. Carolynne Citron.    3 month ICM trend: 06/10/2023.    12-14 Month ICM trend:     Almyra Jain, RN 06/10/2023 4:51 PM

## 2023-06-11 ENCOUNTER — Other Ambulatory Visit: Payer: Self-pay | Admitting: Interventional Cardiology

## 2023-06-11 ENCOUNTER — Other Ambulatory Visit (HOSPITAL_BASED_OUTPATIENT_CLINIC_OR_DEPARTMENT_OTHER): Payer: Self-pay

## 2023-06-13 ENCOUNTER — Other Ambulatory Visit (HOSPITAL_BASED_OUTPATIENT_CLINIC_OR_DEPARTMENT_OTHER): Payer: Self-pay

## 2023-06-24 ENCOUNTER — Encounter (INDEPENDENT_AMBULATORY_CARE_PROVIDER_SITE_OTHER): Admitting: Ophthalmology

## 2023-06-26 DIAGNOSIS — I5020 Unspecified systolic (congestive) heart failure: Secondary | ICD-10-CM | POA: Diagnosis not present

## 2023-06-26 DIAGNOSIS — G311 Senile degeneration of brain, not elsewhere classified: Secondary | ICD-10-CM | POA: Diagnosis not present

## 2023-06-26 DIAGNOSIS — N183 Chronic kidney disease, stage 3 unspecified: Secondary | ICD-10-CM | POA: Diagnosis not present

## 2023-06-26 DIAGNOSIS — I4891 Unspecified atrial fibrillation: Secondary | ICD-10-CM | POA: Diagnosis not present

## 2023-06-27 ENCOUNTER — Other Ambulatory Visit (HOSPITAL_BASED_OUTPATIENT_CLINIC_OR_DEPARTMENT_OTHER): Payer: Self-pay

## 2023-06-27 DIAGNOSIS — M961 Postlaminectomy syndrome, not elsewhere classified: Secondary | ICD-10-CM | POA: Diagnosis not present

## 2023-06-27 DIAGNOSIS — D329 Benign neoplasm of meninges, unspecified: Secondary | ICD-10-CM | POA: Diagnosis not present

## 2023-06-27 DIAGNOSIS — H532 Diplopia: Secondary | ICD-10-CM | POA: Diagnosis not present

## 2023-06-27 DIAGNOSIS — N489 Disorder of penis, unspecified: Secondary | ICD-10-CM | POA: Diagnosis not present

## 2023-06-27 DIAGNOSIS — M5416 Radiculopathy, lumbar region: Secondary | ICD-10-CM | POA: Diagnosis not present

## 2023-06-27 DIAGNOSIS — Z6827 Body mass index (BMI) 27.0-27.9, adult: Secondary | ICD-10-CM | POA: Diagnosis not present

## 2023-06-27 DIAGNOSIS — J019 Acute sinusitis, unspecified: Secondary | ICD-10-CM | POA: Diagnosis not present

## 2023-06-27 DIAGNOSIS — F419 Anxiety disorder, unspecified: Secondary | ICD-10-CM | POA: Diagnosis not present

## 2023-06-27 MED ORDER — AMOXICILLIN 500 MG PO CAPS
500.0000 mg | ORAL_CAPSULE | Freq: Three times a day (TID) | ORAL | 0 refills | Status: DC
  Filled 2023-06-27: qty 30, 10d supply, fill #0

## 2023-06-28 ENCOUNTER — Other Ambulatory Visit (HOSPITAL_BASED_OUTPATIENT_CLINIC_OR_DEPARTMENT_OTHER): Payer: Self-pay

## 2023-06-28 MED ORDER — FLUTICASONE PROPIONATE 50 MCG/ACT NA SUSP
1.0000 | Freq: Every day | NASAL | 0 refills | Status: DC
Start: 2023-06-28 — End: 2024-01-02
  Filled 2023-06-28: qty 16, 30d supply, fill #0

## 2023-07-01 ENCOUNTER — Other Ambulatory Visit (HOSPITAL_BASED_OUTPATIENT_CLINIC_OR_DEPARTMENT_OTHER): Payer: Self-pay

## 2023-07-01 MED ORDER — TRAMADOL HCL 50 MG PO TABS
50.0000 mg | ORAL_TABLET | Freq: Two times a day (BID) | ORAL | 2 refills | Status: DC | PRN
  Filled 2023-07-01: qty 60, 30d supply, fill #0

## 2023-07-01 MED ORDER — DIAZEPAM 5 MG PO TABS
5.0000 mg | ORAL_TABLET | Freq: Every day | ORAL | 2 refills | Status: DC | PRN
  Filled 2023-07-01: qty 30, 30d supply, fill #0

## 2023-07-03 ENCOUNTER — Ambulatory Visit (INDEPENDENT_AMBULATORY_CARE_PROVIDER_SITE_OTHER): Payer: Medicare HMO

## 2023-07-03 DIAGNOSIS — I442 Atrioventricular block, complete: Secondary | ICD-10-CM

## 2023-07-03 LAB — CUP PACEART REMOTE DEVICE CHECK
Battery Remaining Longevity: 7 mo
Battery Remaining Percentage: 6 %
Battery Voltage: 2.8 V
Date Time Interrogation Session: 20250507043558
Implantable Lead Connection Status: 753985
Implantable Lead Connection Status: 753985
Implantable Lead Connection Status: 753985
Implantable Lead Implant Date: 20161205
Implantable Lead Implant Date: 20161205
Implantable Lead Implant Date: 20161205
Implantable Lead Location: 753858
Implantable Lead Location: 753859
Implantable Lead Location: 753860
Implantable Pulse Generator Implant Date: 20161205
Lead Channel Impedance Value: 490 Ohm
Lead Channel Impedance Value: 990 Ohm
Lead Channel Pacing Threshold Amplitude: 0.75 V
Lead Channel Pacing Threshold Amplitude: 0.75 V
Lead Channel Pacing Threshold Pulse Width: 0.5 ms
Lead Channel Pacing Threshold Pulse Width: 0.8 ms
Lead Channel Sensing Intrinsic Amplitude: 12 mV
Lead Channel Setting Pacing Amplitude: 1.75 V
Lead Channel Setting Pacing Amplitude: 2.5 V
Lead Channel Setting Pacing Pulse Width: 0.5 ms
Lead Channel Setting Pacing Pulse Width: 0.8 ms
Lead Channel Setting Sensing Sensitivity: 2 mV
Pulse Gen Model: 3262
Pulse Gen Serial Number: 7802901

## 2023-07-04 ENCOUNTER — Other Ambulatory Visit: Payer: Self-pay

## 2023-07-04 ENCOUNTER — Other Ambulatory Visit (HOSPITAL_COMMUNITY): Payer: Self-pay

## 2023-07-04 ENCOUNTER — Other Ambulatory Visit (HOSPITAL_BASED_OUTPATIENT_CLINIC_OR_DEPARTMENT_OTHER): Payer: Self-pay

## 2023-07-07 ENCOUNTER — Encounter (HOSPITAL_COMMUNITY): Payer: Self-pay

## 2023-07-07 ENCOUNTER — Emergency Department (HOSPITAL_COMMUNITY)

## 2023-07-07 ENCOUNTER — Other Ambulatory Visit: Payer: Self-pay

## 2023-07-07 ENCOUNTER — Emergency Department (HOSPITAL_COMMUNITY)
Admission: EM | Admit: 2023-07-07 | Discharge: 2023-07-07 | Disposition: A | Discharge status: Home / Self Care | Attending: Emergency Medicine | Admitting: Emergency Medicine

## 2023-07-07 DIAGNOSIS — Z96643 Presence of artificial hip joint, bilateral: Secondary | ICD-10-CM | POA: Diagnosis not present

## 2023-07-07 DIAGNOSIS — I4891 Unspecified atrial fibrillation: Secondary | ICD-10-CM | POA: Insufficient documentation

## 2023-07-07 DIAGNOSIS — G9389 Other specified disorders of brain: Secondary | ICD-10-CM | POA: Diagnosis not present

## 2023-07-07 DIAGNOSIS — S0990XA Unspecified injury of head, initial encounter: Secondary | ICD-10-CM | POA: Diagnosis not present

## 2023-07-07 DIAGNOSIS — R9082 White matter disease, unspecified: Secondary | ICD-10-CM | POA: Insufficient documentation

## 2023-07-07 DIAGNOSIS — K5730 Diverticulosis of large intestine without perforation or abscess without bleeding: Secondary | ICD-10-CM | POA: Diagnosis not present

## 2023-07-07 DIAGNOSIS — W1830XA Fall on same level, unspecified, initial encounter: Secondary | ICD-10-CM | POA: Insufficient documentation

## 2023-07-07 DIAGNOSIS — I509 Heart failure, unspecified: Secondary | ICD-10-CM | POA: Diagnosis not present

## 2023-07-07 DIAGNOSIS — Z7901 Long term (current) use of anticoagulants: Secondary | ICD-10-CM | POA: Insufficient documentation

## 2023-07-07 DIAGNOSIS — M25511 Pain in right shoulder: Secondary | ICD-10-CM | POA: Insufficient documentation

## 2023-07-07 DIAGNOSIS — W19XXXA Unspecified fall, initial encounter: Secondary | ICD-10-CM

## 2023-07-07 DIAGNOSIS — N1831 Chronic kidney disease, stage 3a: Secondary | ICD-10-CM | POA: Diagnosis not present

## 2023-07-07 DIAGNOSIS — M47816 Spondylosis without myelopathy or radiculopathy, lumbar region: Secondary | ICD-10-CM | POA: Diagnosis not present

## 2023-07-07 DIAGNOSIS — M25551 Pain in right hip: Secondary | ICD-10-CM | POA: Diagnosis not present

## 2023-07-07 DIAGNOSIS — I10 Essential (primary) hypertension: Secondary | ICD-10-CM | POA: Diagnosis not present

## 2023-07-07 DIAGNOSIS — Z471 Aftercare following joint replacement surgery: Secondary | ICD-10-CM | POA: Diagnosis not present

## 2023-07-07 DIAGNOSIS — G319 Degenerative disease of nervous system, unspecified: Secondary | ICD-10-CM | POA: Diagnosis not present

## 2023-07-07 DIAGNOSIS — M85811 Other specified disorders of bone density and structure, right shoulder: Secondary | ICD-10-CM | POA: Diagnosis not present

## 2023-07-07 DIAGNOSIS — I491 Atrial premature depolarization: Secondary | ICD-10-CM | POA: Diagnosis not present

## 2023-07-07 DIAGNOSIS — S3993XA Unspecified injury of pelvis, initial encounter: Secondary | ICD-10-CM | POA: Diagnosis not present

## 2023-07-07 DIAGNOSIS — I700 Atherosclerosis of aorta: Secondary | ICD-10-CM | POA: Diagnosis not present

## 2023-07-07 DIAGNOSIS — S3991XA Unspecified injury of abdomen, initial encounter: Secondary | ICD-10-CM | POA: Diagnosis not present

## 2023-07-07 DIAGNOSIS — M19011 Primary osteoarthritis, right shoulder: Secondary | ICD-10-CM | POA: Diagnosis not present

## 2023-07-07 DIAGNOSIS — Z043 Encounter for examination and observation following other accident: Secondary | ICD-10-CM | POA: Diagnosis not present

## 2023-07-07 LAB — BASIC METABOLIC PANEL WITH GFR
Anion gap: 11 (ref 5–15)
BUN: 17 mg/dL (ref 8–23)
CO2: 22 mmol/L (ref 22–32)
Calcium: 9 mg/dL (ref 8.9–10.3)
Chloride: 102 mmol/L (ref 98–111)
Creatinine, Ser: 1.06 mg/dL (ref 0.61–1.24)
GFR, Estimated: >60 mL/min (ref >60)
Glucose, Bld: 107 mg/dL — ABNORMAL HIGH (ref 70–99)
Potassium: 4.4 mmol/L (ref 3.5–5.1)
Sodium: 135 mmol/L (ref 135–145)

## 2023-07-07 LAB — CBC WITH DIFFERENTIAL/PLATELET
Abs Immature Granulocytes: 0.02 10*3/uL (ref 0.00–0.07)
Basophils Absolute: 0 10*3/uL (ref 0.0–0.1)
Basophils Relative: 1 %
Eosinophils Absolute: 0.1 10*3/uL (ref 0.0–0.5)
Eosinophils Relative: 2 %
HCT: 41.4 % (ref 39.0–52.0)
Hemoglobin: 14 g/dL (ref 13.0–17.0)
Immature Granulocytes: 0 %
Lymphocytes Relative: 36 %
Lymphs Abs: 1.9 10*3/uL (ref 0.7–4.0)
MCH: 31.5 pg (ref 26.0–34.0)
MCHC: 33.8 g/dL (ref 30.0–36.0)
MCV: 93.2 fL (ref 80.0–100.0)
Monocytes Absolute: 0.7 10*3/uL (ref 0.1–1.0)
Monocytes Relative: 13 %
Neutro Abs: 2.7 10*3/uL (ref 1.7–7.7)
Neutrophils Relative %: 48 %
Platelets: 118 10*3/uL — ABNORMAL LOW (ref 150–400)
RBC: 4.44 MIL/uL (ref 4.22–5.81)
RDW: 14.1 % (ref 11.5–15.5)
WBC: 5.5 10*3/uL (ref 4.0–10.5)
nRBC: 0 % (ref 0.0–0.2)

## 2023-07-07 MED ORDER — LIDOCAINE 5 % EX PTCH
2.0000 | MEDICATED_PATCH | CUTANEOUS | Status: DC
  Administered 2023-07-07: 2 via TRANSDERMAL
  Filled 2023-07-07: qty 2

## 2023-07-07 MED ORDER — LIDOCAINE 5 % EX PTCH: 1.0000 | MEDICATED_PATCH | CUTANEOUS | 0 refills | Status: DC

## 2023-07-07 MED ORDER — IOHEXOL 350 MG/ML SOLN
75.0000 mL | Freq: Once | INTRAVENOUS | Status: AC | PRN
  Administered 2023-07-07: 75 mL via INTRAVENOUS

## 2023-07-07 MED ORDER — MORPHINE SULFATE (PF) 2 MG/ML IV SOLN
2.0000 mg | Freq: Once | INTRAVENOUS | Status: AC
  Administered 2023-07-07: 2 mg via INTRAVENOUS
  Filled 2023-07-07: qty 1

## 2023-07-07 MED ORDER — SODIUM CHLORIDE 0.9 % IV BOLUS
500.0000 mL | Freq: Once | INTRAVENOUS | Status: AC
  Administered 2023-07-07: 500 mL via INTRAVENOUS

## 2023-07-07 MED ORDER — ACETAMINOPHEN 325 MG PO TABS
650.0000 mg | ORAL_TABLET | Freq: Once | ORAL | Status: AC
  Administered 2023-07-07: 650 mg via ORAL
  Filled 2023-07-07: qty 2

## 2023-07-07 MED ORDER — ONDANSETRON HCL 4 MG/2ML IJ SOLN: 4.0000 mg | Freq: Once | INTRAMUSCULAR | Status: DC

## 2023-07-07 NOTE — ED Triage Notes (Signed)
 Pt bib ems fro home; went to sit down, missed chair; pt on plavix ; landed on r hip and r shoulder; unsure if hit head, no loc; no obvious head trauma, no obvious injury; PERRL; c collar in place; FD placed make shift pelvic binder, pt states improved pain; c/ o r hip and r shoulder pain; 100 mcg fentanyl , 4 mg zofran  given pta; and o x 4; 22 ga LH; 146/79, HR 86, 99%, cbg 184, RR 14

## 2023-07-07 NOTE — ED Provider Notes (Signed)
 [place] [facility] Provider Note   CSN: [id number] Arrival date & time: 07/07/23  7829     History  Chief Complaint  Patient presents with   [name]    [name] is a 89 y.o. male history of A-fib, vitreous hemorrhage in the left eye, CKD stage IIIa, heart failure presented after mechanical [name].  Patient went to go sit down this morning and missed the chair and landed on his buttock.  Patient is unsure if he hit his head.  Patient not on any blood thinners.  Patient has been ambulate states his right hip and right shoulder hurt.  Patient denies any head or neck pain.   Home Medications Prior to Admission medications   Medication Sig Start Date End Date Taking? Authorizing Provider  lidocaine  (LIDODERM ) 5 % Place 1 patch onto the skin daily. Remove & Discard patch within 12 hours or as directed by MD 07/07/23  Yes [name], Nathan Benes, PA-[name]  acetaminophen  (TYLENOL ) 500 MG tablet Take 500 mg by mouth daily as needed for mild pain (pain score 1-3). And take two by mouth twice daily at 6 am and 4:30 pm for pain    [provider]  ALPHAGAN  P 0.1 % SOLN Instill 1 drop into both eyes twice a day 01/28/23     ALPHAGAN  P 0.1 % SOLN Instill 1 drop into both eyes twice a day 02/26/23     amoxicillin  (AMOXIL ) 500 MG capsule Take 1 capsule (500 mg total) by mouth every 8 (eight) hours. 06/27/23     brimonidine  (ALPHAGAN ) 0.2 % ophthalmic solution Place 1 drop into both eyes 2 (two) times daily. 03/04/23     brimonidine  (ALPHAGAN ) 0.2 % ophthalmic solution Place 1 drop into both eyes 2 (two) times daily. 05/14/23     clopidogrel  (PLAVIX ) 75 MG tablet Take 1 tablet (75 mg total) by mouth daily. 12/31/22   Nathan Fall, MD  diazepam  (VALIUM ) 5 MG tablet Take 1 tablet (5 mg total) by mouth daily as needed. 07/01/23     dorzolamide -timolol  (COSOPT ) 2-0.5 % ophthalmic solution Place 1 drop into both eyes 2 (two) times daily. 01/28/23     dorzolamide -timolol   (COSOPT ) 2-0.5 % ophthalmic solution Instill 1 drop into both eyes twice a day 02/26/23     esomeprazole  (NEXIUM ) 40 MG capsule Take 40 mg by mouth daily as needed (for acid reflux). 03/12/22   [provider]  fluticasone  (FLONASE ) 50 MCG/ACT nasal spray Place 1-2 sprays into both nostrils daily  for 1- 2 weeks then as needed 06/28/23     latanoprost  (XALATAN ) 0.005 % ophthalmic solution Place 1 drop into both eyes at bedtime. 01/28/23     latanoprost  (XALATAN ) 0.005 % ophthalmic solution Place 1 drop into both eyes at bedtime. 02/26/23     nitroGLYCERIN  (NITROSTAT ) 0.4 MG SL tablet Place 1 tablet (0.4 mg total) under the tongue every 5 (five) minutes as needed for chest pain. 03/08/21   Medina-Vargas, Monina C, NP  potassium chloride  (KLOR-CON  M) 10 MEQ tablet Take 10 mEq by mouth daily. 11/14/22   [provider]  Probiotic Product (PROBIOTIC PO) Take 1 capsule by mouth daily.    [provider]  tamsulosin  (FLOMAX ) 0.4 MG CAPS capsule Take 1 capsule (0.4 mg total) by mouth at bedtime. 01/02/23     torsemide  (DEMADEX ) 20 MG tablet Take 1 tablet (20 mg) daily except on Monday, Wednesday, and Friday take 1/2 tablet (10mg ) 11/29/22  Nathan Cave, Scott T, PA-[name]  traMADol  (ULTRAM ) 50 MG tablet Take 50 mg by mouth 2 (two) times daily as needed. 10/31/22   [provider]  traMADol  (ULTRAM ) 50 MG tablet Take 1 tablet (50 mg total) by mouth 2 (two) times daily as needed. 03/13/23     traMADol  (ULTRAM ) 50 MG tablet Take 1 tablet (50 mg total) by mouth 2 (two) times daily as needed. 07/01/23         Allergies    Gabapentin, Parecoxib sodium [parecoxib], Codeine, and Warfarin sodium    Review of Systems   Review of Systems  Physical Exam Updated Vital Signs BP (!) 141/81   Pulse 75   Temp (!) 97.5 F (36.4 [name]) (Axillary)   Resp 18   SpO2 100%  Physical Exam Vitals reviewed.  Constitutional:      General: He is not in acute distress. Eyes:     Extraocular Movements:  Extraocular movements intact.     Conjunctiva/sclera: Conjunctivae normal.     Pupils: Pupils are equal, round, and reactive to light.  Neck:     Comments: In [name]-collar Cardiovascular:     Rate and Rhythm: Normal rate.     Pulses: Normal pulses.  Pulmonary:     Effort: Pulmonary effort is normal. No respiratory distress.     Breath sounds: Normal breath sounds.  Abdominal:     Palpations: Abdomen is soft.     Tenderness: There is no abdominal tenderness. There is no guarding or rebound.  Musculoskeletal:     Comments: Right shoulder: Tender to palpation and tender when I passively range the joint however no obvious deformities or clavicular tenderness Negative bilateral logroll, able to wiggle toes bilaterally, bilateral 5-5 plantarflexion is dorsiflexion, no signs of exterior trauma or deformities Pain not out of proportion Soft compartments  Skin:    General: Skin is warm and dry.     Capillary Refill: Capillary refill takes less than 2 seconds.  Neurological:     Mental Status: He is alert.     Comments: Sensation intact distally Patient is hard of hearing and unable to follow commands to do neuroexam  Psychiatric:        Mood and Affect: Mood normal.     ED Results / Procedures / Treatments   Labs (all labs ordered are listed, but only abnormal results are displayed) Labs Reviewed  CBC WITH DIFFERENTIAL/PLATELET - Abnormal; Notable for the following components:      Result Value   Platelets 118 (*)    All other components within normal limits  BASIC METABOLIC PANEL WITH GFR - Abnormal; Notable for the following components:   Glucose, Bld 107 (*)    All other components within normal limits    EKG EKG Interpretation Date/Time:  Sunday Jul 07 2023 08:37:23 EDT Ventricular Rate:  80 PR Interval:    QRS Duration:  185 QT Interval:  488 QTC Calculation: 563 R Axis:   -60  Text Interpretation: Accelerated junctional rhythm RBBB and LAFB Confirmed by [name]  ([id number]) on 07/07/2023 8:50:57 AM  Radiology CT Head Wo Contrast Result Date: 07/07/2023 CLINICAL DATA:  [name].  Hit head. EXAM: CT HEAD WITHOUT CONTRAST CT CERVICAL SPINE WITHOUT CONTRAST TECHNIQUE: Multidetector CT imaging of the head and cervical spine was performed following the standard protocol without intravenous contrast. Multiplanar CT image reconstructions of the cervical spine were also generated. RADIATION DOSE REDUCTION: This exam was performed according to the departmental dose-optimization program which includes automated exposure control,  adjustment of the mA and/or kV according to patient size and/or use of iterative reconstruction technique. COMPARISON:  02/20/2023. FINDINGS: CT HEAD FINDINGS Brain: Stable age related cerebral atrophy, ventriculomegaly and periventricular white matter disease. No extra-axial fluid collections are identified. No CT findings for acute hemispheric infarction or intracranial hemorrhage. No mass lesions. The brainstem and cerebellum are normal. Vascular: Stable advanced vascular calcifications but no focal aneurysm or hyperdense vessels. Skull: No acute skull fracture or bone lesion. 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 Scattered ethmoid sinus disease. The mastoid air cells and middle ear cavities are clear. Other: No scalp lesions or scalp hematoma. CT CERVICAL SPINE: Stable advanced degenerative cervical spondylosis with severe multilevel disc disease and facet disease. The overall alignment is maintained. No acute fracture or abnormal prevertebral soft tissue swelling. No significant canal stenosis. IMPRESSION: 1. Stable age related cerebral atrophy, ventriculomegaly and periventricular white matter disease. 2. No acute intracranial findings or skull fracture. 3. 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 4. Stable advanced degenerative cervical spondylosis with severe  multilevel disc disease and facet disease. 5. No acute cervical spine fracture. Electronically Signed   By: Marrian Siva M.D.   On: 07/07/2023 11:01   CT Cervical Spine Wo Contrast Result Date: 07/07/2023 CLINICAL DATA:  [name].  Hit head. EXAM: CT HEAD WITHOUT CONTRAST CT CERVICAL SPINE WITHOUT CONTRAST TECHNIQUE: Multidetector CT imaging of the head and cervical spine was performed following the standard protocol without intravenous contrast. Multiplanar CT image reconstructions of the cervical spine were also generated. RADIATION DOSE REDUCTION: This exam was performed according to the departmental dose-optimization program which includes automated exposure control, adjustment of the mA and/or kV according to patient size and/or use of iterative reconstruction technique. COMPARISON:  02/20/2023. FINDINGS: CT HEAD FINDINGS Brain: Stable age related cerebral atrophy, ventriculomegaly and periventricular white matter disease. No extra-axial fluid collections are identified. No CT findings for acute hemispheric infarction or intracranial hemorrhage. No mass lesions. The brainstem and cerebellum are normal. Vascular: Stable advanced vascular calcifications but no focal aneurysm or hyperdense vessels. Skull: No acute skull fracture or bone lesion. 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 Scattered ethmoid sinus disease. The mastoid air cells and middle ear cavities are clear. Other: No scalp lesions or scalp hematoma. CT CERVICAL SPINE: Stable advanced degenerative cervical spondylosis with severe multilevel disc disease and facet disease. The overall alignment is maintained. No acute fracture or abnormal prevertebral soft tissue swelling. No significant canal stenosis. IMPRESSION: 1. Stable age related cerebral atrophy, ventriculomegaly and periventricular white matter disease. 2. No acute intracranial findings or skull fracture. 3. Chronic right maxillary sinus  disease with evidence of prior sinonasal surgery. 4. Stable advanced degenerative cervical spondylosis with severe multilevel disc disease and facet disease. 5. No acute cervical spine fracture. Electronically Signed   By: Marrian Siva M.D.   On: 07/07/2023 11:01   CT ABDOMEN PELVIS W CONTRAST Result Date: 07/07/2023 CLINICAL DATA:  Trauma.  Fell. EXAM: CT ABDOMEN AND PELVIS WITH CONTRAST TECHNIQUE: Multidetector CT imaging of the abdomen and pelvis was performed using the standard protocol following bolus administration of intravenous contrast. RADIATION DOSE REDUCTION: This exam was performed according to the departmental dose-optimization program which includes automated exposure control, adjustment of the mA and/or kV according to patient size and/or use of iterative reconstruction technique. CONTRAST:  75mL OMNIPAQUE  IOHEXOL  350 MG/ML SOLN COMPARISON:  02/20/2023 FINDINGS: Lower chest: Streaky bibasilar atelectasis  and scarring change. No pleural effusion. No pericardial effusion. Advanced vascular disease, unchanged. Hepatobiliary: No hepatic injury or perihepatic hematoma. No hepatic lesions. No intrahepatic biliary dilatation. The gallbladder is surgically absent. No common bile duct dilatation. Pancreas: Unremarkable. No pancreatic ductal dilatation or surrounding inflammatory changes. Spleen: No splenic injury or perisplenic hematoma. Adrenals/Urinary Tract: The adrenal glands and kidneys are unremarkable and stable. No acute renal injury or perinephric hematoma. Stomach/Bowel: The stomach, duodenum, small bowel and colon are grossly normal without oral contrast. No inflammatory changes, mass lesions or obstructive findings. Stable colonic diverticulosis. Vascular/Lymphatic: Stable advanced vascular disease but aneurysm dissection. No abdominal or pelvic lymphadenopathy. Reproductive: Poor visualization of the prostate gland seminal vesicles due to artifact from bilateral hip prostheses. Penile  prosthesis noted. Other: No free air or free fluid to suggest a bowel injury. No mesenteric or pelvic hematoma. No subcutaneous hematomas. Musculoskeletal: The bony structures are intact. No lower rib fractures. Stable degenerative changes involving the spine and SI joints. No pelvic fractures. Stable scoliosis and severe degenerative lumbar spondylosis. IMPRESSION: 1. No acute abdominal/pelvic findings, mass lesions or adenopathy. 2. No acute bony findings. 3. Stable advanced vascular disease. 4. Intact bony structures.  No pelvic or spine fractures. Electronically Signed   By: Marrian Siva M.D.   On: 07/07/2023 10:52   DG Ribs Unilateral W/Chest Right Result Date: 07/07/2023 CLINICAL DATA:  Status post [name]. EXAM: RIGHT RIBS AND CHEST - 3+ VIEW COMPARISON:  Chest radiographs 02/20/2023 and 04/28/2022. Chest CT 02/20/2023. FINDINGS: The heart size and mediastinal contours are stable. There is extensive aortic atherosclerosis. Left subclavian pacemaker leads project over the right atrium, right ventricle and coronary sinus. There is improved aeration of the lung bases compared with the previous study. No evidence of pneumothorax or significant pleural effusion. No acute right-sided rib fractures or focal rib lesions are identified. There are degenerative changes in the spine. IMPRESSION: 1. No evidence of acute right-sided rib fracture or pneumothorax. 2. Improved aeration of the lung bases compared with previous study. Electronically Signed   By: Elmon Hagedorn M.D.   On: 07/07/2023 10:47   DG Hip Unilat With Pelvis 2-3 Views Right Result Date: 07/07/2023 CLINICAL DATA:  Status post [name]. EXAM: DG HIP (WITH OR WITHOUT PELVIS) 2-3V RIGHT COMPARISON:  Radiographs 06/27/2009.  Same day pelvic CT. FINDINGS: The bones appear demineralized. Patient is status post bilateral total hip arthroplasty. The hardware appears intact, without evidence of loosening. No evidence of acute fracture or dislocation. There are  degenerative changes in the lumbar spine with an underlying convex right scoliosis. Extensive aortoiliac vascular calcifications and penile prosthesis are noted. IMPRESSION: No evidence of acute fracture or dislocation. Bilateral total hip arthroplasty without evidence of hardware complication. Electronically Signed   By: Elmon Hagedorn M.D.   On: 07/07/2023 10:44   DG Shoulder Right Result Date: 07/07/2023 CLINICAL DATA:  Status post [name]. EXAM: RIGHT SHOULDER - 2+ VIEW COMPARISON:  None Available. FINDINGS: The glenohumeral joint appears located. No signs of acute fracture or dislocation. The bones are osteopenic. Degenerative changes at the acromioclavicular and glenohumeral joint. Age indeterminate fractures are noted involving anterolateral aspect of the right second and third ribs. IMPRESSION: 1. No signs of right shoulder fracture or dislocation. 2. Age indeterminate fractures involving the anterolateral aspect of the right second and third ribs. 3. Osteopenia and osteoarthritis. Electronically Signed   By: Kimberley Penman M.D.   On: 07/07/2023 10:42    Procedures Procedures    Medications Ordered in ED Medications  lidocaine  (LIDODERM ) 5 % 2 patch (2 patches Transdermal Patch Applied 07/07/23 0947)  acetaminophen  (TYLENOL ) tablet 650 mg (650 mg Oral Given 07/07/23 0942)  morphine  (PF) 2 MG/ML injection 2 mg (2 mg Intravenous Given 07/07/23 1037)  sodium chloride  0.9 % bolus 500 mL (500 mLs Intravenous New Bag/Given 07/07/23 1035)  iohexol  (OMNIPAQUE ) 350 MG/ML injection 75 mL (75 mLs Intravenous Contrast Given 07/07/23 1037)    ED Course/ Medical Decision Making/ A&P                                 Medical Decision Making Amount and/or Complexity of Data Reviewed Labs: ordered. Radiology: ordered.  Risk OTC drugs. Prescription drug management.   [name] 89 y.o. presented today for [name]. Working DDx that I considered at this time includes, but not limited to, vasovagal  episode, mechanical [name], ICH, epidural/subdural hematoma, basilar skull fracture, anemia, electrolyte abnormalities, drug-induced, arrhythmia, UTI, fracture, contusion, soft tissue injury.  R/o DDx: vasovagal episode, ICH, epidural/subdural hematoma, basilar skull fracture, anemia, electrolyte abnormalities, drug-induced, arrhythmia, UTI, fracture: These are considered less likely due to history of present illness, physical exam, lab/imaging findings  Review of prior external notes: 06/28/23 unknown  Unique Tests and My Independent Interpretation:  CT Head without contrast: No acute pathology CT cervical spine without contrast: No acute pathology CBC: Unremarkable BMP: Unremarkable Right rib x-ray: No acute pathology Right shoulder x-ray: No acute pathology Right hip x-ray: No acute pathology CT abdomen pelvis contrast: No acute pathology EKG: Junctional rhythm 80 bpm, no signs of ischemia  Social Determinants of Health: none  Discussion with Independent Historian: None  Discussion of Management of Tests: None  Risk: Medium: prescription drug management  Risk Stratification Score: None  Staffed with Martina Sledge, DO  Plan: On exam patient was no acute distress with stable vitals.  Patient's right hip was nontender and had negative logroll test however he endorsing right hip pain so we will get x-ray.  Patient's right shoulder also hurt when I passively ranged it and although there were no deformities or obvious signs of trauma we will get x-ray there as well.  Patient came in [name]-collar however is not endorse any neck pain and states he did not hit his head however given age will get CT head and neck to rule out any pathology.  Patient given fentanyl  by EMS which seems to help.  Patient's labs and imaging came back negative.  At this time patient most likely has contusions from his [name] and safe to be discharged.  Will send him home with a prescription of lidocaine  patches and encouraged to  use Tylenol  every 6 hours needed for pain.  At time of discharge patient's pain is controlled.  Patient was given return precautions. Patient stable for discharge at this time.  Patient verbalized understanding of plan.  This chart was dictated using voice recognition software.  Despite best efforts to proofread,  errors can occur which can change the documentation meaning.         Final Clinical Impression(s) / ED Diagnoses Final diagnoses:  [name], initial encounter    Rx / DC Orders ED Discharge Orders          Ordered    lidocaine  (LIDODERM ) 5 %  Every 24 hours        07/07/23 1106              Denese Finn, [place] 07/07/23 1116  Teddi Favors, DO 07/11/23 [phone]

## 2023-07-07 NOTE — Discharge Instructions (Addendum)
 We saw you in the ER after you had a fall. All the imaging results are normal, no fractures seen. No evidence of brain bleed. Please be very careful with walking, and do everything possible to prevent falls. Please follow up with your primary care provider in the next week to be re-evaluated and ensure you are improving. If symptoms change or worsen, please return to ER.

## 2023-07-07 NOTE — ED Notes (Signed)
 Patient transported to CT

## 2023-07-07 NOTE — ED Notes (Signed)
 L Hearing aid placed in cup with pt label on counter in room 25

## 2023-07-10 ENCOUNTER — Other Ambulatory Visit: Payer: Self-pay | Admitting: Interventional Cardiology

## 2023-07-10 ENCOUNTER — Other Ambulatory Visit (HOSPITAL_BASED_OUTPATIENT_CLINIC_OR_DEPARTMENT_OTHER): Payer: Self-pay

## 2023-07-10 ENCOUNTER — Other Ambulatory Visit: Payer: Self-pay

## 2023-07-10 MED ORDER — DORZOLAMIDE HCL-TIMOLOL MAL 2-0.5 % OP SOLN
1.0000 [drp] | Freq: Two times a day (BID) | OPHTHALMIC | 3 refills | Status: DC
Start: 2022-12-24 — End: 2024-01-02
  Filled 2023-08-14: qty 10, 50d supply, fill #0

## 2023-07-10 MED ORDER — LATANOPROST 0.005 % OP SOLN
1.0000 [drp] | Freq: Every evening | OPHTHALMIC | 1 refills | Status: DC
Start: 2022-11-05 — End: 2024-01-02
  Filled 2023-08-14: qty 2.5, 25d supply, fill #0

## 2023-07-10 MED ORDER — POTASSIUM CHLORIDE CRYS ER 10 MEQ PO TBCR
10.0000 meq | EXTENDED_RELEASE_TABLET | Freq: Every day | ORAL | 1 refills | Status: DC
  Filled 2023-07-11 (×2): qty 90, 90d supply, fill #0

## 2023-07-11 ENCOUNTER — Other Ambulatory Visit (HOSPITAL_BASED_OUTPATIENT_CLINIC_OR_DEPARTMENT_OTHER): Payer: Self-pay

## 2023-07-15 ENCOUNTER — Ambulatory Visit: Attending: Internal Medicine

## 2023-07-15 DIAGNOSIS — I5042 Chronic combined systolic (congestive) and diastolic (congestive) heart failure: Secondary | ICD-10-CM

## 2023-07-15 DIAGNOSIS — Z950 Presence of cardiac pacemaker: Secondary | ICD-10-CM | POA: Diagnosis not present

## 2023-07-16 NOTE — Progress Notes (Signed)
 EPIC Encounter for ICM Monitoring  Patient Name: [name] is a 89 y.o. male Date: 07/16/2023 Primary Care Physican: Jimmey Mould, MD Primary Cardiologist: Renna Cary Electrophysiologist: Carolynne Citron  BiV Pacing: 85% 03/13/2023 Office Weight: 180 lbs 07/16/2023 Weight:  179-180 lbs   Battery Longevity: 6.4 months    Spoke with patient and heart failure questions reviewed.  Transmission results reviewed.  Pt asymptomatic for fluid accumulation.  Reports feeling well at this time and voices no complaints.     CorVue thoracic impedance suggesting possible fluid accumulation starting 5/11 and returned to normal 5/18.   Prescribed:  Torsemide  20 mg Take 1 tablet (20 mg total) by mouth daily except on Monday, Wednesday and Friday take 0.5 tablet (10 mg total). Potassium 10 mEq take 1 tablet daily   Labs: 03/13/2023 Creatinine 1.09, BUN 15, Potassium 4.6, Sodium 130, GFR 63 11/28/2022 Creatinine 1.03, BUN 17, Potassium 4.5, Sodium 130, GFR 67 04/28/2022 Creatinine 1.21, BUN 19, Potassium 4.1, Sodium 133, GFR 56 A complete set of results can be found in Results Review.   Recommendations:  No changes and encouraged to call if experiencing any fluid symptoms.   Follow-up plan: ICM clinic phone appointment on 09/16/2023.  91 day device clinic remote transmission 10/02/2023.     EP/Cardiology Office Visits:    Recall 12/22/2023 with Dr Carolynne Citron.   Next cardiology visit due 11/29/2023 with Dr Renna Cary or Marlyse Single, PA (no recall).   Copy of ICM check sent to Dr. Carolynne Citron.    3 month ICM trend: 07/15/2023.    12-14 Month ICM trend:     Almyra Jain, RN 07/16/2023 4:45 PM

## 2023-07-17 ENCOUNTER — Other Ambulatory Visit (HOSPITAL_BASED_OUTPATIENT_CLINIC_OR_DEPARTMENT_OTHER): Payer: Self-pay

## 2023-07-23 ENCOUNTER — Other Ambulatory Visit (HOSPITAL_BASED_OUTPATIENT_CLINIC_OR_DEPARTMENT_OTHER): Payer: Self-pay

## 2023-07-27 DIAGNOSIS — N183 Chronic kidney disease, stage 3 unspecified: Secondary | ICD-10-CM | POA: Diagnosis not present

## 2023-07-27 DIAGNOSIS — G311 Senile degeneration of brain, not elsewhere classified: Secondary | ICD-10-CM | POA: Diagnosis not present

## 2023-07-27 DIAGNOSIS — I5020 Unspecified systolic (congestive) heart failure: Secondary | ICD-10-CM | POA: Diagnosis not present

## 2023-07-27 DIAGNOSIS — I4891 Unspecified atrial fibrillation: Secondary | ICD-10-CM | POA: Diagnosis not present

## 2023-08-05 ENCOUNTER — Other Ambulatory Visit (HOSPITAL_BASED_OUTPATIENT_CLINIC_OR_DEPARTMENT_OTHER): Payer: Self-pay

## 2023-08-06 ENCOUNTER — Other Ambulatory Visit (HOSPITAL_BASED_OUTPATIENT_CLINIC_OR_DEPARTMENT_OTHER): Payer: Self-pay

## 2023-08-06 MED ORDER — DIAZEPAM 5 MG PO TABS
5.0000 mg | ORAL_TABLET | Freq: Every day | ORAL | 2 refills | Status: DC | PRN
  Filled 2023-08-06 (×2): qty 30, 30d supply, fill #0
  Filled 2023-09-11 – 2023-09-12 (×2): qty 30, 30d supply, fill #1

## 2023-08-06 MED ORDER — ESOMEPRAZOLE MAGNESIUM 40 MG PO CPDR
40.0000 mg | DELAYED_RELEASE_CAPSULE | ORAL | 1 refills | Status: DC
  Filled 2023-08-06: qty 90, 90d supply, fill #0

## 2023-08-07 ENCOUNTER — Other Ambulatory Visit (HOSPITAL_BASED_OUTPATIENT_CLINIC_OR_DEPARTMENT_OTHER): Payer: Self-pay

## 2023-08-09 ENCOUNTER — Other Ambulatory Visit (HOSPITAL_BASED_OUTPATIENT_CLINIC_OR_DEPARTMENT_OTHER): Payer: Self-pay

## 2023-08-13 ENCOUNTER — Other Ambulatory Visit (HOSPITAL_BASED_OUTPATIENT_CLINIC_OR_DEPARTMENT_OTHER): Payer: Self-pay

## 2023-08-13 NOTE — Progress Notes (Signed)
 Remote pacemaker transmission.

## 2023-08-14 ENCOUNTER — Other Ambulatory Visit (HOSPITAL_BASED_OUTPATIENT_CLINIC_OR_DEPARTMENT_OTHER): Payer: Self-pay

## 2023-08-15 ENCOUNTER — Other Ambulatory Visit (HOSPITAL_BASED_OUTPATIENT_CLINIC_OR_DEPARTMENT_OTHER): Payer: Self-pay

## 2023-08-18 ENCOUNTER — Emergency Department (HOSPITAL_BASED_OUTPATIENT_CLINIC_OR_DEPARTMENT_OTHER)
Admission: EM | Admit: 2023-08-18 | Discharge: 2023-08-18 | Disposition: A | Discharge status: Home / Self Care | Attending: Emergency Medicine | Admitting: Emergency Medicine

## 2023-08-18 ENCOUNTER — Emergency Department (HOSPITAL_BASED_OUTPATIENT_CLINIC_OR_DEPARTMENT_OTHER)

## 2023-08-18 ENCOUNTER — Encounter (HOSPITAL_BASED_OUTPATIENT_CLINIC_OR_DEPARTMENT_OTHER): Payer: Self-pay | Admitting: Emergency Medicine

## 2023-08-18 ENCOUNTER — Other Ambulatory Visit: Payer: Self-pay

## 2023-08-18 DIAGNOSIS — R1030 Lower abdominal pain, unspecified: Secondary | ICD-10-CM

## 2023-08-18 DIAGNOSIS — I509 Heart failure, unspecified: Secondary | ICD-10-CM | POA: Diagnosis not present

## 2023-08-18 DIAGNOSIS — Z8546 Personal history of malignant neoplasm of prostate: Secondary | ICD-10-CM | POA: Diagnosis not present

## 2023-08-18 DIAGNOSIS — D696 Thrombocytopenia, unspecified: Secondary | ICD-10-CM | POA: Insufficient documentation

## 2023-08-18 DIAGNOSIS — Z7902 Long term (current) use of antithrombotics/antiplatelets: Secondary | ICD-10-CM | POA: Insufficient documentation

## 2023-08-18 DIAGNOSIS — I2510 Atherosclerotic heart disease of native coronary artery without angina pectoris: Secondary | ICD-10-CM | POA: Diagnosis not present

## 2023-08-18 DIAGNOSIS — I130 Hypertensive heart and chronic kidney disease with heart failure and stage 1 through stage 4 chronic kidney disease, or unspecified chronic kidney disease: Secondary | ICD-10-CM | POA: Insufficient documentation

## 2023-08-18 DIAGNOSIS — R339 Retention of urine, unspecified: Secondary | ICD-10-CM | POA: Diagnosis not present

## 2023-08-18 DIAGNOSIS — K5730 Diverticulosis of large intestine without perforation or abscess without bleeding: Secondary | ICD-10-CM | POA: Diagnosis not present

## 2023-08-18 DIAGNOSIS — R918 Other nonspecific abnormal finding of lung field: Secondary | ICD-10-CM | POA: Diagnosis not present

## 2023-08-18 DIAGNOSIS — R051 Acute cough: Secondary | ICD-10-CM | POA: Diagnosis not present

## 2023-08-18 DIAGNOSIS — R059 Cough, unspecified: Secondary | ICD-10-CM | POA: Diagnosis not present

## 2023-08-18 DIAGNOSIS — R7989 Other specified abnormal findings of blood chemistry: Secondary | ICD-10-CM | POA: Insufficient documentation

## 2023-08-18 DIAGNOSIS — N183 Chronic kidney disease, stage 3 unspecified: Secondary | ICD-10-CM | POA: Diagnosis not present

## 2023-08-18 DIAGNOSIS — I700 Atherosclerosis of aorta: Secondary | ICD-10-CM | POA: Diagnosis not present

## 2023-08-18 DIAGNOSIS — R1031 Right lower quadrant pain: Secondary | ICD-10-CM | POA: Insufficient documentation

## 2023-08-18 DIAGNOSIS — Z950 Presence of cardiac pacemaker: Secondary | ICD-10-CM | POA: Diagnosis not present

## 2023-08-18 HISTORY — DX: Presence of cardiac pacemaker: Z95.0

## 2023-08-18 LAB — COMPREHENSIVE METABOLIC PANEL WITH GFR
ALT: 10 U/L (ref 0–44)
AST: 20 U/L (ref 15–41)
Albumin: 4.2 g/dL (ref 3.5–5.0)
Alkaline Phosphatase: 110 U/L (ref 38–126)
Anion gap: 13 (ref 5–15)
BUN: 21 mg/dL (ref 8–23)
CO2: 27 mmol/L (ref 22–32)
Calcium: 9.8 mg/dL (ref 8.9–10.3)
Chloride: 96 mmol/L — ABNORMAL LOW (ref 98–111)
Creatinine, Ser: 1.05 mg/dL (ref 0.61–1.24)
GFR, Estimated: >60 mL/min (ref >60)
Glucose, Bld: 116 mg/dL — ABNORMAL HIGH (ref 70–99)
Potassium: 4.3 mmol/L (ref 3.5–5.1)
Sodium: 135 mmol/L (ref 135–145)
Total Bilirubin: 0.6 mg/dL (ref 0.0–1.2)
Total Protein: 6.4 g/dL — ABNORMAL LOW (ref 6.5–8.1)

## 2023-08-18 LAB — CBC
HCT: 40 % (ref 39.0–52.0)
Hemoglobin: 13.5 g/dL (ref 13.0–17.0)
MCH: 31.5 pg (ref 26.0–34.0)
MCHC: 33.8 g/dL (ref 30.0–36.0)
MCV: 93.5 fL (ref 80.0–100.0)
Platelets: 122 10*3/uL — ABNORMAL LOW (ref 150–400)
RBC: 4.28 MIL/uL (ref 4.22–5.81)
RDW: 14 % (ref 11.5–15.5)
WBC: 5.8 10*3/uL (ref 4.0–10.5)
nRBC: 0 % (ref 0.0–0.2)

## 2023-08-18 LAB — RESP PANEL BY RT-PCR (RSV, FLU A&B, COVID)  RVPGX2
Influenza A by PCR: NEGATIVE
Influenza B by PCR: NEGATIVE
Resp Syncytial Virus by PCR: NEGATIVE
SARS Coronavirus 2 by RT PCR: NEGATIVE

## 2023-08-18 LAB — URINALYSIS, ROUTINE W REFLEX MICROSCOPIC
Bilirubin Urine: NEGATIVE
Glucose, UA: NEGATIVE mg/dL
Hgb urine dipstick: NEGATIVE
Ketones, ur: NEGATIVE mg/dL
Leukocytes,Ua: NEGATIVE
Nitrite: NEGATIVE
Protein, ur: NEGATIVE mg/dL
Specific Gravity, Urine: 1.014 (ref 1.005–1.030)
pH: 7 (ref 5.0–8.0)

## 2023-08-18 LAB — LIPASE, BLOOD: Lipase: 31 U/L (ref 11–51)

## 2023-08-18 LAB — PRO BRAIN NATRIURETIC PEPTIDE: Pro Brain Natriuretic Peptide: 2716 pg/mL — ABNORMAL HIGH (ref <300.0)

## 2023-08-18 MED ORDER — IOHEXOL 350 MG/ML SOLN
75.0000 mL | Freq: Once | INTRAVENOUS | Status: AC | PRN
  Administered 2023-08-18: 75 mL via INTRAVENOUS

## 2023-08-18 MED ORDER — BENZONATATE 100 MG PO CAPS: 100.0000 mg | ORAL_CAPSULE | Freq: Three times a day (TID) | ORAL | 0 refills | Status: DC | PRN

## 2023-08-18 NOTE — ED Provider Notes (Signed)
 [place] [facility]MENT AT [facility] Provider Note   CSN: [id number] Arrival date & time: 08/18/23  1012     Patient presents with: urine retention   [name] is a 89 y.o. male.   HPI   89 year old male presents emergency department with a few different complaints.  States that his had cough present for the past few months.  States he is followed with his primary care is tried antibiotics, antihistamines, nasal sprays which he states has not been helping very much.  States that cough has been more noticeable the past couple of days.  Denies any fevers, chills, chest pain, shortness of breath.  To the cough tends to be worsened in the morning and he coughs up thick white phlegm.  Patient also complaining of right lower abdominal pain .  States that he has had this for the past several months as well but states that is worsened over the past week or 2.  Denies any dysuria, hematuria, urinary frequency, change in bowel habits.  Also complaining of urinary retention.  States that he woke up at 4 AM and felt the need to urinate but was unable to.  Does states that he has a penis bump and and feels like it is malfunctioning and would like to have it removed.  Denies any change in bowel habits with last bowel movement yesterday.  Denies any saddle anesthesia, new weakness/sensory deficits lower extremities, history of IV drug use, prolonged corticosteroid use, known malignancy.  Past medical history significant for prostate cancer, CHF atrial fibrillation, degenerative disc disease, CKD 3, GERD, glaucoma, hearing impaired, hypertension, left bundle branch block, MI  Prior to Admission medications   Medication Sig Start Date End Date Taking? Authorizing Provider  acetaminophen  (TYLENOL ) 500 MG tablet Take 500 mg by mouth daily as needed for mild pain (pain score 1-3). And take two by mouth twice daily at 6 am and 4:30 pm for pain    [provider]  ALPHAGAN  P 0.1  % SOLN Instill 1 drop into both eyes twice a day 01/28/23     ALPHAGAN  P 0.1 % SOLN Instill 1 drop into both eyes twice a day 02/26/23     amoxicillin  (AMOXIL ) 500 MG capsule Take 1 capsule (500 mg total) by mouth every 8 (eight) hours. 06/27/23     brimonidine  (ALPHAGAN ) 0.2 % ophthalmic solution Place 1 drop into both eyes 2 (two) times daily. 03/04/23     brimonidine  (ALPHAGAN ) 0.2 % ophthalmic solution Place 1 drop into both eyes 2 (two) times daily. 05/14/23     clopidogrel  (PLAVIX ) 75 MG tablet Take 1 tablet (75 mg total) by mouth daily. 12/31/22   Waddell Danelle ORN, MD  diazepam  (VALIUM ) 5 MG tablet Take 1 tablet (5 mg total) by mouth daily as needed. 07/01/23     diazepam  (VALIUM ) 5 MG tablet Take 1 tablet (5 mg total) by mouth daily as needed. 08/06/23     dorzolamide -timolol  (COSOPT ) 2-0.5 % ophthalmic solution Place 1 drop into both eyes 2 (two) times daily. 01/28/23     dorzolamide -timolol  (COSOPT ) 2-0.5 % ophthalmic solution Instill 1 drop into both eyes twice a day 02/26/23     dorzolamide -timolol  (COSOPT ) 2-0.5 % ophthalmic solution Place 1 drop into both eyes 2 (two) times daily. 12/24/22     esomeprazole  (NEXIUM ) 40 MG capsule Take 40 mg by mouth daily as needed (for acid reflux). 03/12/22   [provider]  esomeprazole  (NEXIUM ) 40 MG capsule Take 1  capsule (40 mg total) by mouth every morning 30 minutes to 1 hour before meal as needed. 08/06/23     fluticasone  (FLONASE ) 50 MCG/ACT nasal spray Place 1-2 sprays into both nostrils daily  for 1- 2 weeks then as needed 06/28/23     latanoprost  (XALATAN ) 0.005 % ophthalmic solution Place 1 drop into both eyes at bedtime. 01/28/23     latanoprost  (XALATAN ) 0.005 % ophthalmic solution Place 1 drop into both eyes at bedtime. 02/26/23     latanoprost  (XALATAN ) 0.005 % ophthalmic solution Place 1 drop into both eyes every evening. 11/05/22     lidocaine  (LIDODERM ) 5 % Place 1 patch onto the skin daily. Remove & Discard patch within 12 hours or as  directed by MD 07/07/23   Victor Lynwood DASEN, PA-C  nitroGLYCERIN  (NITROSTAT ) 0.4 MG SL tablet Place 1 tablet (0.4 mg total) under the tongue every 5 (five) minutes as needed for chest pain. 03/08/21   Medina-Vargas, Monina C, NP  potassium chloride  (KLOR-CON  M) 10 MEQ tablet Take 10 mEq by mouth daily. 11/14/22   [provider]  potassium chloride  (KLOR-CON  M) 10 MEQ tablet Take 1 tablet (10 mEq total) by mouth daily with food. 01/28/23   Okey Carlin Redbird, MD  Probiotic Product (PROBIOTIC PO) Take 1 capsule by mouth daily.    [provider]  tamsulosin  (FLOMAX ) 0.4 MG CAPS capsule Take 1 capsule (0.4 mg total) by mouth at bedtime. 01/02/23     torsemide  (DEMADEX ) 20 MG tablet Take 1 tablet (20 mg) daily except on Monday, Wednesday, and Friday take 1/2 tablet (10mg ) 11/29/22   Lelon Hamilton T, PA-C  traMADol  (ULTRAM ) 50 MG tablet Take 50 mg by mouth 2 (two) times daily as needed. 10/31/22   [provider]  traMADol  (ULTRAM ) 50 MG tablet Take 1 tablet (50 mg total) by mouth 2 (two) times daily as needed. 03/13/23     traMADol  (ULTRAM ) 50 MG tablet Take 1 tablet (50 mg total) by mouth 2 (two) times daily as needed. 07/01/23       Allergies: Gabapentin, Parecoxib sodium [parecoxib], Codeine, and Warfarin sodium    Review of Systems  All other systems reviewed and are negative.   Updated Vital Signs BP 125/77 (BP Location: Right Arm)   Pulse 78   Temp 98.1 F (36.7 C)   Resp 18   SpO2 97%   Physical Exam Vitals and nursing note reviewed.  Constitutional:      General: He is not in acute distress.    Appearance: He is well-developed.  HENT:     Head: Normocephalic and atraumatic.   Eyes:     Conjunctiva/sclera: Conjunctivae normal.    Cardiovascular:     Rate and Rhythm: Normal rate and regular rhythm.  Pulmonary:     Effort: Pulmonary effort is normal. No respiratory distress.     Breath sounds: Normal breath sounds. No wheezing, rhonchi or rales.  Abdominal:      Palpations: Abdomen is soft.     Tenderness: There is abdominal tenderness. There is no right CVA tenderness, left CVA tenderness or guarding.     Comments: Tenderness suprapubic as well as right lower quadrant.   Musculoskeletal:        General: No swelling.     Cervical back: Neck supple.     Comments: Patient was bilateral extremities without difficulty.  No sensory deficits on major nerve divisions of the lower extremities.  Pedal pulses and posterior tibial pulses symmetric.  Skin:    General: Skin is warm and dry.     Capillary Refill: Capillary refill takes less than 2 seconds.   Neurological:     Mental Status: He is alert.   Psychiatric:        Mood and Affect: Mood normal.     (all labs ordered are listed, but only abnormal results are displayed) Labs Reviewed  RESP PANEL BY RT-PCR (RSV, FLU A&B, COVID)  [id number]  URINALYSIS, ROUTINE W REFLEX MICROSCOPIC  COMPREHENSIVE METABOLIC PANEL WITH GFR  CBC  PRO BRAIN NATRIURETIC PEPTIDE  LIPASE, BLOOD    EKG: None  Radiology: No results found.   Procedures   Medications Ordered in the ED - No data to display  Clinical Course as of 08/18/23 1131  Sun Aug 18, 2023  1101 Cough x 6 months [CR]    Clinical Course User Index [CR] Silver Wonda LABOR, PA                                 Medical Decision Making Amount and/or Complexity of Data Reviewed Labs: ordered. Radiology: ordered.  Risk Prescription drug management.   This patient presents to the ED for concern of cough, abdominal pain, urinary retention, this involves an extensive number of treatment options, and is a complaint that carries with it a high risk of complications and morbidity.  The differential diagnosis includes COVID, flu, RSV, pneumonia, CHF, GERD, asthma, COPD, malignancy, appendicitis, diverticulitis, SBO/LBO, volvulus, BPH, cauda equina, spinal epidural abscess, medication side effect, UTI, other   Co morbidities that complicate  the patient evaluation  See HPI   Additional history obtained:  Additional history obtained from EMR External records from outside source obtained and reviewed including hospital records   Lab Tests:  I Ordered, and personally interpreted labs.  The pertinent results include: No leukocytosis.  No evidence of anemia.  Thrombocytopenia platelets of 122.  Mild hypochloremia of 96 otherwise electrolytes within normal limits.  No transaminitis.  No renal dysfunction.  UA without abnormality.  Lipase within normal limits.  Viral testing negative.  proBNP elevated at 2716.   Imaging Studies ordered:  I ordered imaging studies including chest x-ray, CT abdomen pelvis, CT angio chest PE I independently visualized and interpreted imaging which showed  Chest x-ray: Enlarged cardiac silhouette.  Right lung scarring CT angio chest PE/abdomen/pelvis: No evidence of PE.  Cardiomegaly.  Bilateral lung nodules measuring up to 4 mm.  Diffuse colonic diverticulosis.  No acute intra-abdominal or pelvic abnormality.  Aortic atherosclerosis. I agree with the radiologist interpretation  Cardiac Monitoring: / EKG:  The patient was maintained on a cardiac monitor.  I personally viewed and interpreted the cardiac monitored which showed an underlying rhythm of: Paced rhythm   Consultations Obtained:  I requested consultation with attending physician Dr. Zackowski regarding the patient especially since BNP elevated without evidence of systemic volume overload.  Recommendation was to follow-up outpatient with primary care/cardiology.  Problem List / ED Course / Critical interventions / Medication management  Urinary retention, cough, right lower abdominal pain Reevaluation of the patient after these medicines showed that the patient improved I have reviewed the patients home medicines and have made adjustments as needed   Social Determinants of Health:  Former cigarette use.  Denies illicit drug  use.   Test / Admission - Considered:  Urinary retention, cough, right lower abdominal pain Vitals signs within normal range and stable throughout visit.  Laboratory/imaging studies significant for: See above 89 year old male presents emergency department with a few different complaints.  States that his had cough present for the past few months.  States he is followed with his primary care is tried antibiotics, antihistamines, nasal sprays which he states has not been helping very much.  States that cough has been more noticeable the past couple of days.  Denies any fevers, chills, chest pain, shortness of breath.  To the cough tends to be worsened in the morning and he coughs up thick white phlegm.  Patient also complaining of right lower abdominal pain .  States that he has had this for the past several months as well but states that is worsened over the past week or 2.  Denies any dysuria, hematuria, urinary frequency, change in bowel habits.  Also complaining of urinary retention.  States that he woke up at 4 AM and felt the need to urinate but was unable to.  Does states that he has a penis bump and and feels like it is malfunctioning and would like to have it removed.  Denies any change in bowel habits with last bowel movement yesterday.  Denies any saddle anesthesia, new weakness/sensory deficits lower extremities, history of IV drug use, prolonged corticosteroid use, known malignancy. On exam, tenderness right lower quadrant of abdomen as well as suprapubic region with slight distention appreciated and suprapubic region.  No obvious wheeze, rales, rhonchi and bilateral lung fields.  Labs concerning for BNP elevation 2716 but patient without evidence of pulmonary vascular congestion/pleural effusion or systemic evidence of volume overload.  Consulted attending physician regarding isolated elevated BNP recommended follow-up outpatient.  Otherwise, labs without acute emergent abnormality.  Imaging  was obtained given patient's chronic cough which he states has worsened over the past week or 2 which was negative for any acute abnormality; incidental findings as above.  CT imaging was obtained given patient's right lower abdominal tenderness as well as in the setting of acute urinary tension which was negative for any acute abnormality as well.  When attempting to pass the Foley catheter, nursing staff noted mechanical obstruction and urethra but once passed blockage, was able to give 500+ cc urine in bag.  UA did not show evidence of infection.  Patient without other red flags concerning for cauda equina.  Will keep indwelling Foley catheter and and recommend follow-up with urology in the outpatient setting.  Treatment plan discussed with patient and he acknowledged understanding was agreeable to said plan.  Patient overall well-appearing, afebrile in no acute distress. Worrisome signs and symptoms were discussed with the patient, and the patient acknowledged understanding to return to the ED if noticed. Patient was stable upon discharge.       Final diagnoses:  None    ED Discharge Orders     None          Silver Wonda LABOR, [place] 08/18/23 1801    Geraldene Hamilton, MD 08/19/23 2033

## 2023-08-18 NOTE — Discharge Instructions (Signed)
 As discussed, your workup today was reassuring.  CT imaging of your chest and abdomen showed nothing acutely abnormal as cause of your right side abdominal pain or as cause of your cough.  Will send in cough medicine to use as needed.  I suspect that your urinary retention is likely secondary to mechanical obstruction most likely from your prostate as it was difficult for the nursing staff to pass a Foley catheter into your bladder.  Will recommend follow-up with urology in the outpatient setting.  Will attach information for their office as well as send in a referral.  Please do not hesitate to return to emergency department if the worrisome signs and symptoms we discussed become apparent.

## 2023-08-18 NOTE — ED Triage Notes (Signed)
 About 4 am this am, woke up coughing, thick phlegm,pt felt like he needed to void and couldn't. Right flank pain for months .  Last time he urinated was yesterday evening. Pt does have urologist

## 2023-08-18 NOTE — ED Notes (Signed)
 Called [name]- update given. Nathan will send for ride.

## 2023-08-21 ENCOUNTER — Other Ambulatory Visit (HOSPITAL_BASED_OUTPATIENT_CLINIC_OR_DEPARTMENT_OTHER): Payer: Self-pay

## 2023-08-23 ENCOUNTER — Emergency Department (HOSPITAL_COMMUNITY)
Admission: EM | Admit: 2023-08-23 | Discharge: 2023-08-23 | Disposition: A | Discharge status: Home / Self Care | Attending: Emergency Medicine | Admitting: Emergency Medicine

## 2023-08-23 ENCOUNTER — Emergency Department (HOSPITAL_COMMUNITY)

## 2023-08-23 ENCOUNTER — Other Ambulatory Visit: Payer: Self-pay

## 2023-08-23 DIAGNOSIS — M546 Pain in thoracic spine: Secondary | ICD-10-CM | POA: Diagnosis not present

## 2023-08-23 DIAGNOSIS — M549 Dorsalgia, unspecified: Secondary | ICD-10-CM | POA: Diagnosis not present

## 2023-08-23 DIAGNOSIS — R251 Tremor, unspecified: Secondary | ICD-10-CM | POA: Insufficient documentation

## 2023-08-23 DIAGNOSIS — R10813 Right lower quadrant abdominal tenderness: Secondary | ICD-10-CM | POA: Diagnosis not present

## 2023-08-23 DIAGNOSIS — Y92410 Unspecified street and highway as the place of occurrence of the external cause: Secondary | ICD-10-CM | POA: Insufficient documentation

## 2023-08-23 DIAGNOSIS — R1031 Right lower quadrant pain: Secondary | ICD-10-CM | POA: Diagnosis not present

## 2023-08-23 DIAGNOSIS — M545 Low back pain, unspecified: Secondary | ICD-10-CM | POA: Diagnosis not present

## 2023-08-23 DIAGNOSIS — S20411A Abrasion of right back wall of thorax, initial encounter: Secondary | ICD-10-CM | POA: Diagnosis not present

## 2023-08-23 DIAGNOSIS — M5136 Other intervertebral disc degeneration, lumbar region with discogenic back pain only: Secondary | ICD-10-CM | POA: Diagnosis not present

## 2023-08-23 DIAGNOSIS — Z978 Presence of other specified devices: Secondary | ICD-10-CM

## 2023-08-23 DIAGNOSIS — Z960 Presence of urogenital implants: Secondary | ICD-10-CM | POA: Insufficient documentation

## 2023-08-23 DIAGNOSIS — R109 Unspecified abdominal pain: Secondary | ICD-10-CM

## 2023-08-23 DIAGNOSIS — Z8546 Personal history of malignant neoplasm of prostate: Secondary | ICD-10-CM | POA: Insufficient documentation

## 2023-08-23 DIAGNOSIS — I2510 Atherosclerotic heart disease of native coronary artery without angina pectoris: Secondary | ICD-10-CM | POA: Diagnosis not present

## 2023-08-23 DIAGNOSIS — I700 Atherosclerosis of aorta: Secondary | ICD-10-CM | POA: Diagnosis not present

## 2023-08-23 DIAGNOSIS — W19XXXA Unspecified fall, initial encounter: Secondary | ICD-10-CM | POA: Diagnosis not present

## 2023-08-23 DIAGNOSIS — R519 Headache, unspecified: Secondary | ICD-10-CM | POA: Diagnosis not present

## 2023-08-23 DIAGNOSIS — Z7902 Long term (current) use of antithrombotics/antiplatelets: Secondary | ICD-10-CM | POA: Diagnosis not present

## 2023-08-23 DIAGNOSIS — R079 Chest pain, unspecified: Secondary | ICD-10-CM | POA: Diagnosis not present

## 2023-08-23 DIAGNOSIS — N189 Chronic kidney disease, unspecified: Secondary | ICD-10-CM | POA: Diagnosis not present

## 2023-08-23 DIAGNOSIS — G9600 Cerebrospinal fluid leak, unspecified: Secondary | ICD-10-CM | POA: Diagnosis not present

## 2023-08-23 DIAGNOSIS — G319 Degenerative disease of nervous system, unspecified: Secondary | ICD-10-CM | POA: Diagnosis not present

## 2023-08-23 DIAGNOSIS — R9431 Abnormal electrocardiogram [ECG] [EKG]: Secondary | ICD-10-CM | POA: Diagnosis not present

## 2023-08-23 LAB — CBC WITH DIFFERENTIAL/PLATELET
Abs Immature Granulocytes: 0.03 10*3/uL (ref 0.00–0.07)
Basophils Absolute: 0 10*3/uL (ref 0.0–0.1)
Basophils Relative: 0 %
Eosinophils Absolute: 0.1 10*3/uL (ref 0.0–0.5)
Eosinophils Relative: 2 %
HCT: 39.1 % (ref 39.0–52.0)
Hemoglobin: 13.1 g/dL (ref 13.0–17.0)
Immature Granulocytes: 0 %
Lymphocytes Relative: 18 %
Lymphs Abs: 1.6 10*3/uL (ref 0.7–4.0)
MCH: 31.6 pg (ref 26.0–34.0)
MCHC: 33.5 g/dL (ref 30.0–36.0)
MCV: 94.4 fL (ref 80.0–100.0)
Monocytes Absolute: 1.1 10*3/uL — ABNORMAL HIGH (ref 0.1–1.0)
Monocytes Relative: 12 %
Neutro Abs: 6.3 10*3/uL (ref 1.7–7.7)
Neutrophils Relative %: 68 %
Platelets: 143 10*3/uL — ABNORMAL LOW (ref 150–400)
RBC: 4.14 MIL/uL — ABNORMAL LOW (ref 4.22–5.81)
RDW: 13.7 % (ref 11.5–15.5)
WBC: 9.2 10*3/uL (ref 4.0–10.5)
nRBC: 0 % (ref 0.0–0.2)

## 2023-08-23 LAB — COMPREHENSIVE METABOLIC PANEL WITH GFR
ALT: 13 U/L (ref 0–44)
AST: 18 U/L (ref 15–41)
Albumin: 3.5 g/dL (ref 3.5–5.0)
Alkaline Phosphatase: 79 U/L (ref 38–126)
Anion gap: 11 (ref 5–15)
BUN: 16 mg/dL (ref 8–23)
CO2: 22 mmol/L (ref 22–32)
Calcium: 8.9 mg/dL (ref 8.9–10.3)
Chloride: 99 mmol/L (ref 98–111)
Creatinine, Ser: 1.13 mg/dL (ref 0.61–1.24)
GFR, Estimated: >60 mL/min (ref >60)
Glucose, Bld: 118 mg/dL — ABNORMAL HIGH (ref 70–99)
Potassium: 4.6 mmol/L (ref 3.5–5.1)
Sodium: 132 mmol/L — ABNORMAL LOW (ref 135–145)
Total Bilirubin: 0.9 mg/dL (ref 0.0–1.2)
Total Protein: 5.9 g/dL — ABNORMAL LOW (ref 6.5–8.1)

## 2023-08-23 LAB — URINALYSIS, MICROSCOPIC (REFLEX): WBC, UA: >50 WBC/hpf (ref 0–5)

## 2023-08-23 LAB — URINALYSIS, ROUTINE W REFLEX MICROSCOPIC

## 2023-08-23 LAB — LIPASE, BLOOD: Lipase: 31 U/L (ref 11–51)

## 2023-08-23 MED ORDER — IOHEXOL 350 MG/ML SOLN
75.0000 mL | Freq: Once | INTRAVENOUS | Status: AC | PRN
  Administered 2023-08-23: 75 mL via INTRAVENOUS

## 2023-08-23 NOTE — ED Notes (Signed)
 Called [facility] to add to cardiac monitoring

## 2023-08-23 NOTE — ED Triage Notes (Signed)
 Pt coming from home via EMS c/o RLQ abd pain that started this morning, & shaking that has resolved. Pain is 5/10  Foley placed a week ago for urinary retention. Lower back pain from a fall 2 days ago. Skin tears-bandaged.  BP 118/68. HR 90. 95%RA. BG 121. 97.8

## 2023-08-23 NOTE — ED Notes (Signed)
 Patient transported to CT

## 2023-08-23 NOTE — Discharge Instructions (Signed)
 You were seen in the emerged from for abdominal pain and uncontrollable shaking this morning Your blood work CAT scans all looked okay There was no evidence of trauma or acute abdominal infection There were no broken bones in your back or neck No injury to the head Please keep your appoint with the urologist on Monday to discuss further need for Foley catheter and to follow-up on the results of your urine test You should also follow-up with your primary care doctor in 1 week for reevaluation Return to the emergency department for severe weakness, severe pain, repeated falls or any other concerns

## 2023-08-23 NOTE — ED Provider Notes (Signed)
 [place] EMERGENCY DEPARTMENT AT [facility] Provider Note   CSN: [id number] Arrival date & time: 08/23/23  9287     Patient presents with: Abdominal Pain   [name] is a 89 y.o. male.  With a history of prostate cancer, CKD, CAD, GI bleeding and atrial fibrillation on Plavix  who presents to the ED for abdominal pain.  Patient suffered a mechanical fall at home 2 days ago.  He does report head strike with no LOC.  Has had upper back pain since the fall.  This morning he woke with shaking chills and right lower quadrant pain.  His family checked on him and EMS was called.  Right lower quadrant pain has resolved but he still has a lot of back pain from the fall.  No pain in extremities chest pain or shortness of breath.    Abdominal Pain      Prior to Admission medications   Medication Sig Start Date End Date Taking? Authorizing Provider  acetaminophen  (TYLENOL ) 500 MG tablet Take 500 mg by mouth daily as needed for mild pain (pain score 1-3). And take two by mouth twice daily at 6 am and 4:30 pm for pain    [provider]  ALPHAGAN  P 0.1 % SOLN Instill 1 drop into both eyes twice a day 01/28/23     ALPHAGAN  P 0.1 % SOLN Instill 1 drop into both eyes twice a day 02/26/23     amoxicillin  (AMOXIL ) 500 MG capsule Take 1 capsule (500 mg total) by mouth every 8 (eight) hours. 06/27/23     benzonatate  (TESSALON ) 100 MG capsule Take 1 capsule (100 mg total) by mouth 3 (three) times daily as needed. 08/18/23   Silver Wonda LABOR, PA  brimonidine  (ALPHAGAN ) 0.2 % ophthalmic solution Place 1 drop into both eyes 2 (two) times daily. 03/04/23     brimonidine  (ALPHAGAN ) 0.2 % ophthalmic solution Place 1 drop into both eyes 2 (two) times daily. 05/14/23     clopidogrel  (PLAVIX ) 75 MG tablet Take 1 tablet (75 mg total) by mouth daily. 12/31/22   Waddell Danelle ORN, MD  diazepam  (VALIUM ) 5 MG tablet Take 1 tablet (5 mg total) by mouth daily as needed. 07/01/23     diazepam  (VALIUM ) 5 MG  tablet Take 1 tablet (5 mg total) by mouth daily as needed. 08/06/23     dorzolamide -timolol  (COSOPT ) 2-0.5 % ophthalmic solution Place 1 drop into both eyes 2 (two) times daily. 01/28/23     dorzolamide -timolol  (COSOPT ) 2-0.5 % ophthalmic solution Instill 1 drop into both eyes twice a day 02/26/23     dorzolamide -timolol  (COSOPT ) 2-0.5 % ophthalmic solution Place 1 drop into both eyes 2 (two) times daily. 12/24/22     esomeprazole  (NEXIUM ) 40 MG capsule Take 40 mg by mouth daily as needed (for acid reflux). 03/12/22   [provider]  esomeprazole  (NEXIUM ) 40 MG capsule Take 1 capsule (40 mg total) by mouth every morning 30 minutes to 1 hour before meal as needed. 08/06/23     fluticasone  (FLONASE ) 50 MCG/ACT nasal spray Place 1-2 sprays into both nostrils daily  for 1- 2 weeks then as needed 06/28/23     latanoprost  (XALATAN ) 0.005 % ophthalmic solution Place 1 drop into both eyes at bedtime. 01/28/23     latanoprost  (XALATAN ) 0.005 % ophthalmic solution Place 1 drop into both eyes at bedtime. 02/26/23     latanoprost  (XALATAN ) 0.005 % ophthalmic solution Place 1 drop into both eyes every evening. 11/05/22  lidocaine  (LIDODERM ) 5 % Place 1 patch onto the skin daily. Remove & Discard patch within 12 hours or as directed by MD 07/07/23   Victor Lynwood DASEN, PA-C  nitroGLYCERIN  (NITROSTAT ) 0.4 MG SL tablet Place 1 tablet (0.4 mg total) under the tongue every 5 (five) minutes as needed for chest pain. 03/08/21   Medina-Vargas, Monina C, NP  potassium chloride  (KLOR-CON  M) 10 MEQ tablet Take 10 mEq by mouth daily. 11/14/22   [provider]  potassium chloride  (KLOR-CON  M) 10 MEQ tablet Take 1 tablet (10 mEq total) by mouth daily with food. 01/28/23   Okey Carlin Redbird, MD  Probiotic Product (PROBIOTIC PO) Take 1 capsule by mouth daily.    [provider]  tamsulosin  (FLOMAX ) 0.4 MG CAPS capsule Take 1 capsule (0.4 mg total) by mouth at bedtime. 01/02/23     torsemide  (DEMADEX ) 20 MG  tablet Take 1 tablet (20 mg) daily except on Monday, Wednesday, and Friday take 1/2 tablet (10mg ) 11/29/22   Lelon Hamilton T, PA-C  traMADol  (ULTRAM ) 50 MG tablet Take 50 mg by mouth 2 (two) times daily as needed. 10/31/22   [provider]  traMADol  (ULTRAM ) 50 MG tablet Take 1 tablet (50 mg total) by mouth 2 (two) times daily as needed. 03/13/23     traMADol  (ULTRAM ) 50 MG tablet Take 1 tablet (50 mg total) by mouth 2 (two) times daily as needed. 07/01/23       Allergies: Gabapentin, Parecoxib sodium [parecoxib], Codeine, and Warfarin sodium    Review of Systems  Gastrointestinal:  Positive for abdominal pain.    Updated Vital Signs BP 112/67   Pulse 75   Temp 98.4 F (36.9 C) (Oral)   Resp 20   Ht 5' 7 (1.702 m)   Wt 82 kg   SpO2 93%   BMI 28.31 kg/m   Physical Exam Vitals and nursing note reviewed.  HENT:     Head: Normocephalic and atraumatic.   Eyes:     Pupils: Pupils are equal, round, and reactive to light.    Cardiovascular:     Rate and Rhythm: Normal rate and regular rhythm.  Pulmonary:     Effort: Pulmonary effort is normal.     Breath sounds: Normal breath sounds.  Abdominal:     Palpations: Abdomen is soft.     Tenderness: There is abdominal tenderness in the right lower quadrant. There is no guarding or rebound.  Genitourinary:    Comments: Foley catheter in place draining clear yellow urine Uncircumcised penis  Musculoskeletal:     Comments: No midline cervical tenderness or deformity Midline tenderness of mid thoracic region with a linear abrasion over the right paraspinal thoracic region and another over the left paraspinal upper thoracic region No step-offs deformities of thoracic or lumbar spine 5 out of 5 motor strength bilateral upper and lower extremities with no evidence of trauma Pelvis stable to rocking compression No chest wall tenderness    Skin:    General: Skin is warm and dry.   Neurological:     Mental Status: He is alert.    Psychiatric:        Mood and Affect: Mood normal.     (all labs ordered are listed, but only abnormal results are displayed) Labs Reviewed  COMPREHENSIVE METABOLIC PANEL WITH GFR - Abnormal; Notable for the following components:      Result Value   Sodium 132 (*)    Glucose, Bld 118 (*)    Total Protein  5.9 (*)    All other components within normal limits  CBC WITH DIFFERENTIAL/PLATELET - Abnormal; Notable for the following components:   RBC 4.14 (*)    Platelets 143 (*)    Monocytes Absolute 1.1 (*)    All other components within normal limits  URINALYSIS, ROUTINE W REFLEX MICROSCOPIC - Abnormal; Notable for the following components:   APPearance TURBID (*)    Glucose, UA RESULTS UNAVAILABLE DUE TO INTERFERING SUBSTANCE (*)    Hgb urine dipstick RESULTS UNAVAILABLE DUE TO INTERFERING SUBSTANCE (*)    Bilirubin Urine RESULTS UNAVAILABLE DUE TO INTERFERING SUBSTANCE (*)    Ketones, ur RESULTS UNAVAILABLE DUE TO INTERFERING SUBSTANCE (*)    Protein, ur RESULTS UNAVAILABLE DUE TO INTERFERING SUBSTANCE (*)    Nitrite RESULTS UNAVAILABLE DUE TO INTERFERING SUBSTANCE (*)    Leukocytes,Ua RESULTS UNAVAILABLE DUE TO INTERFERING SUBSTANCE (*)    All other components within normal limits  URINALYSIS, MICROSCOPIC (REFLEX) - Abnormal; Notable for the following components:   Bacteria, UA MANY (*)    All other components within normal limits  URINE CULTURE  LIPASE, BLOOD    EKG: EKG Interpretation Date/Time:  Friday August 23 2023 07:25:54 EDT Ventricular Rate:  88 PR Interval:  53 QRS Duration:  141 QT Interval:  435 QTC Calculation: 509 R Axis:   -29  Text Interpretation: Sinus rhythm Short PR interval Left bundle branch block Confirmed by Pamella Sharper [phone]) on 08/23/2023 9:10:42 AM  Radiology: CT L-SPINE NO CHARGE Result Date: 08/23/2023 CLINICAL DATA:  Fall 2 days ago. Upper back pain and right lower quadrant pain. EXAM: CT THORACIC AND LUMBAR SPINE WITH CONTRAST  TECHNIQUE: Multiplanar CT images of the thoracic and lumbar spine were reconstructed from contemporary CT of the Chest, Abdomen, and Pelvis. RADIATION DOSE REDUCTION: This exam was performed according to the departmental dose-optimization program which includes automated exposure control, adjustment of the mA and/or kV according to patient size and/or use of iterative reconstruction technique. CONTRAST:  No additional. COMPARISON:  CT abdomen and pelvis 07/07/2023. CTA chest 08/18/2023. CT lumbar spine 04/28/2022. FINDINGS: CT THORACIC SPINE FINDINGS Alignment: Mild thoracic levoscoliosis.  No significant listhesis. Vertebrae: No acute fracture or suspicious lesion. Paraspinal and other soft tissues: No acute abnormality in the paraspinal soft tissues. Remainder of the chest reported separately. Disc levels: Mild thoracic spondylosis and moderate facet arthrosis. At least mild spinal stenosis and moderate right neural foraminal stenosis at T10-11. Mild spinal stenosis and severe right neural foraminal stenosis at T11-12. CT LUMBAR SPINE FINDINGS Segmentation: 5 lumbar type vertebrae. Alignment: Moderate to severe lumbar dextroscoliosis. Chronic grade 1 retrolisthesis of L1 on L2, L2 on L3, and L3 on L4. Vertebrae: No acute fracture or suspicious lesion. Partial bilateral SI joint ankylosis. Paraspinal and other soft tissues: No acute abnormality in the paraspinal soft tissues. Bilateral posterior paraspinal muscle atrophy, likely postsurgical. Disc levels: Widespread disc and facet degeneration, similar to the 2024 lumbar CT including severe disc space narrowing from L1-2 through L4-5 with prominent degenerative endplate changes. Prior posterior decompression at L3-4. Moderate osseous spinal canal stenosis at L4-5. Advanced multilevel neural foraminal stenosis. IMPRESSION: 1. No acute osseous abnormality in the thoracic or lumbar spine. 2. Thoracolumbar scoliosis with advanced disc and facet degeneration in the  lumbar spine. Electronically Signed   By: Dasie Hamburg M.D.   On: 08/23/2023 09:31   CT T-SPINE NO CHARGE Result Date: 08/23/2023 CLINICAL DATA:  Fall 2 days ago. Upper back pain and right lower quadrant pain. EXAM: CT  THORACIC AND LUMBAR SPINE WITH CONTRAST TECHNIQUE: Multiplanar CT images of the thoracic and lumbar spine were reconstructed from contemporary CT of the Chest, Abdomen, and Pelvis. RADIATION DOSE REDUCTION: This exam was performed according to the departmental dose-optimization program which includes automated exposure control, adjustment of the mA and/or kV according to patient size and/or use of iterative reconstruction technique. CONTRAST:  No additional. COMPARISON:  CT abdomen and pelvis 07/07/2023. CTA chest 08/18/2023. CT lumbar spine 04/28/2022. FINDINGS: CT THORACIC SPINE FINDINGS Alignment: Mild thoracic levoscoliosis.  No significant listhesis. Vertebrae: No acute fracture or suspicious lesion. Paraspinal and other soft tissues: No acute abnormality in the paraspinal soft tissues. Remainder of the chest reported separately. Disc levels: Mild thoracic spondylosis and moderate facet arthrosis. At least mild spinal stenosis and moderate right neural foraminal stenosis at T10-11. Mild spinal stenosis and severe right neural foraminal stenosis at T11-12. CT LUMBAR SPINE FINDINGS Segmentation: 5 lumbar type vertebrae. Alignment: Moderate to severe lumbar dextroscoliosis. Chronic grade 1 retrolisthesis of L1 on L2, L2 on L3, and L3 on L4. Vertebrae: No acute fracture or suspicious lesion. Partial bilateral SI joint ankylosis. Paraspinal and other soft tissues: No acute abnormality in the paraspinal soft tissues. Bilateral posterior paraspinal muscle atrophy, likely postsurgical. Disc levels: Widespread disc and facet degeneration, similar to the 2024 lumbar CT including severe disc space narrowing from L1-2 through L4-5 with prominent degenerative endplate changes. Prior posterior decompression  at L3-4. Moderate osseous spinal canal stenosis at L4-5. Advanced multilevel neural foraminal stenosis. IMPRESSION: 1. No acute osseous abnormality in the thoracic or lumbar spine. 2. Thoracolumbar scoliosis with advanced disc and facet degeneration in the lumbar spine. Electronically Signed   By: Dasie Hamburg M.D.   On: 08/23/2023 09:31   CT CHEST ABDOMEN PELVIS W CONTRAST Result Date: 08/23/2023 CLINICAL DATA:  Trauma. Status post fall. Complains of upper back pain and right lower quadrant tenderness. EXAM: CT CHEST, ABDOMEN, AND PELVIS WITH CONTRAST TECHNIQUE: Multidetector CT imaging of the chest, abdomen and pelvis was performed following the standard protocol during bolus administration of intravenous contrast. RADIATION DOSE REDUCTION: This exam was performed according to the departmental dose-optimization program which includes automated exposure control, adjustment of the mA and/or kV according to patient size and/or use of iterative reconstruction technique. CONTRAST:  75mL OMNIPAQUE  IOHEXOL  350 MG/ML SOLN COMPARISON:  CT chest 08/18/2023 and CT AP 07/07/2023 FINDINGS: CT CHEST FINDINGS Cardiovascular: The heart size is normal. Extensive aortic atherosclerosis and multi vessel coronary artery calcifications. Left chest wall pacer device noted with leads in the right atrial appendage and right ventricle. The thoracic aorta is intact. No pericardial effusion. Mediastinum/Nodes: Thyroid  gland, trachea and esophagus appear within normal limits. No enlarged mediastinal or hilar lymph nodes. Lungs/Pleura: No pneumothorax. No pleural effusion. No airspace consolidation. Scar versus atelectasis noted in the lingula, right middle lobe and posterior left base. 3 mm nodule within the periphery of the right middle lobe is unchanged from previous exam. Also unchanged is a 4 mm nodule within the left upper lobe, image 85/4. Musculoskeletal: No rib fractures identified. The bones appear diffusely osteopenic. Remote  healed right anterolateral rib fractures. Advanced degenerative changes identified involving the glenohumeral joints and acromioclavicular joints. The thoracic vertebral body heights are well preserved. Degenerative disc disease noted in the lower thoracic spine. CT ABDOMEN PELVIS FINDINGS Hepatobiliary: No focal liver abnormality is seen. Status post cholecystectomy. No biliary dilatation. Pancreas: Unremarkable. No pancreatic ductal dilatation or surrounding inflammatory changes. Spleen: Normal in size without focal abnormality. Adrenals/Urinary Tract:  Normal adrenal glands. There is no nephrolithiasis, hydronephrosis or mass. Urinary bladder is decompressed. Penile prosthesis noted. Stomach/Bowel: No bowel wall thickening, inflammation or distension. Colonic diverticula noted without signs of acute diverticulitis. Moderate stool burden noted within the rectum. Vascular/Lymphatic: Extensive aortic atherosclerosis. No signs of abdominopelvic adenopathy. Reproductive: Streak artifact from bilateral hip arthroplasty devices diminishes exam detail within the lower pelvis in the region of the prostate. Other: There is no free fluid or fluid collections. No signs of pneumoperitoneum. Musculoskeletal: Status post bilateral hip arthroplasty. No acute fracture or subluxation. Thoracolumbar scoliosis. Multilevel degenerative disc disease and facet arthropathy within the lumbar spine. No acute fracture or subluxation. IMPRESSION: 1. No acute findings within the chest, abdomen or pelvis. 2. Stable appearance of small pulmonary nodules. No follow-up needed if patient is low-risk (and has no known or suspected primary neoplasm). Non-contrast chest CT can be considered in 12 months if patient is high-risk. This recommendation follows the consensus statement: Guidelines for Management of Incidental Pulmonary Nodules Detected on CT Images: From the Fleischner Society 2017; Radiology 2017; 284:228-243. 3. Colonic diverticulosis  without signs of acute diverticulitis. 4.  Aortic Atherosclerosis (ICD10-I70.0). Electronically Signed   By: Waddell Calk M.D.   On: 08/23/2023 09:22   CT Head Wo Contrast Result Date: 08/23/2023 CLINICAL DATA:  Fall 2 days ago with head strike and upper back pain. EXAM: CT HEAD WITHOUT CONTRAST CT CERVICAL SPINE WITHOUT CONTRAST TECHNIQUE: Multidetector CT imaging of the head and cervical spine was performed following the standard protocol without intravenous contrast. Multiplanar CT image reconstructions of the cervical spine were also generated. RADIATION DOSE REDUCTION: This exam was performed according to the departmental dose-optimization program which includes automated exposure control, adjustment of the mA and/or kV according to patient size and/or use of iterative reconstruction technique. COMPARISON:  CT head and cervical spine 07/07/2023 FINDINGS: CT HEAD FINDINGS Brain: There is no evidence of an acute infarct, intracranial hemorrhage, or mass. A small chronic low-density subdural collection over the left cerebral convexity is unchanged, measuring up to 7 mm in thickness and consistent with a subdural hygroma. Minimal rightward midline shift is unchanged. There is mild cerebral atrophy. Cerebral white matter hypodensities are unchanged and nonspecific but compatible with mild chronic small vessel ischemic disease. Vascular: Calcified atherosclerosis at the skull base. No hyperdense vessel. Skull: No acute fracture or suspicious lesion. Sinuses/Orbits: Prior functional endoscopic sinus surgery. Chronic right maxillary sinusitis with extensive circumferential mucosal thickening small chronic left mastoid effusion. Bilateral cataract extraction. Other: None. CT CERVICAL SPINE FINDINGS Alignment: Chronic straightening of the normal cervical lordosis and mild right convex curvature of the cervical spine. Unchanged trace retrolisthesis of C6 on C7 and trace anterolisthesis of C3 on C4, C4 on C5, and C7  on T1. Skull base and vertebrae: No acute fracture or suspicious lesion. Moderate to prominent median C1-2 arthropathy with noncompressive ligamentous thickening and calcification posterior to the dens. Soft tissues and spinal canal: No prevertebral fluid or swelling. No visible canal hematoma. Disc levels: Advanced disc degeneration from C3-4 through C6-7 with interbody ankylosis at C3-4 and C5-6 and facet ankylosis at C3-4. Likely moderate spinal stenosis at C5-6 and C6-7. Advanced multilevel neural foraminal stenosis. Upper chest: More fully evaluated on today's separately reported CT of the chest, abdomen, and pelvis. Other: Prominent atherosclerotic calcifications at the carotid bifurcations. IMPRESSION: 1. No evidence of acute intracranial abnormality. 2. No acute cervical spine fracture or traumatic malalignment. 3. Unchanged small chronic left subdural hygroma. Electronically Signed   By: Dasie  Derrill M.D.   On: 08/23/2023 09:19   CT Cervical Spine Wo Contrast Result Date: 08/23/2023 CLINICAL DATA:  Fall 2 days ago with head strike and upper back pain. EXAM: CT HEAD WITHOUT CONTRAST CT CERVICAL SPINE WITHOUT CONTRAST TECHNIQUE: Multidetector CT imaging of the head and cervical spine was performed following the standard protocol without intravenous contrast. Multiplanar CT image reconstructions of the cervical spine were also generated. RADIATION DOSE REDUCTION: This exam was performed according to the departmental dose-optimization program which includes automated exposure control, adjustment of the mA and/or kV according to patient size and/or use of iterative reconstruction technique. COMPARISON:  CT head and cervical spine 07/07/2023 FINDINGS: CT HEAD FINDINGS Brain: There is no evidence of an acute infarct, intracranial hemorrhage, or mass. A small chronic low-density subdural collection over the left cerebral convexity is unchanged, measuring up to 7 mm in thickness and consistent with a subdural  hygroma. Minimal rightward midline shift is unchanged. There is mild cerebral atrophy. Cerebral white matter hypodensities are unchanged and nonspecific but compatible with mild chronic small vessel ischemic disease. Vascular: Calcified atherosclerosis at the skull base. No hyperdense vessel. Skull: No acute fracture or suspicious lesion. Sinuses/Orbits: Prior functional endoscopic sinus surgery. Chronic right maxillary sinusitis with extensive circumferential mucosal thickening small chronic left mastoid effusion. Bilateral cataract extraction. Other: None. CT CERVICAL SPINE FINDINGS Alignment: Chronic straightening of the normal cervical lordosis and mild right convex curvature of the cervical spine. Unchanged trace retrolisthesis of C6 on C7 and trace anterolisthesis of C3 on C4, C4 on C5, and C7 on T1. Skull base and vertebrae: No acute fracture or suspicious lesion. Moderate to prominent median C1-2 arthropathy with noncompressive ligamentous thickening and calcification posterior to the dens. Soft tissues and spinal canal: No prevertebral fluid or swelling. No visible canal hematoma. Disc levels: Advanced disc degeneration from C3-4 through C6-7 with interbody ankylosis at C3-4 and C5-6 and facet ankylosis at C3-4. Likely moderate spinal stenosis at C5-6 and C6-7. Advanced multilevel neural foraminal stenosis. Upper chest: More fully evaluated on today's separately reported CT of the chest, abdomen, and pelvis. Other: Prominent atherosclerotic calcifications at the carotid bifurcations. IMPRESSION: 1. No evidence of acute intracranial abnormality. 2. No acute cervical spine fracture or traumatic malalignment. 3. Unchanged small chronic left subdural hygroma. Electronically Signed   By: Dasie Derrill M.D.   On: 08/23/2023 09:19     Procedures   Medications Ordered in the ED  iohexol  (OMNIPAQUE ) 350 MG/ML injection 75 mL (75 mLs Intravenous Contrast Given 08/23/23 0855)    Clinical Course as of 08/23/23  1018  Fri Aug 23, 2023  1015 EKG without evidence of dysrhythmia.  Laboratory workup unremarkable overall.  No leukocytosis.  UA unable to be run completely due to turbid urine collected from indwelling Foley catheter.  Will need to send for culture.  No fevers or leukocytosis.  Will hold off on antibiotics for now.  He does have an upcoming urology appointment in 3 days to discuss Foley catheter management  CT head C-spine T-spine L-spine chest abdomen pelvis showed no acute traumatic findings or evidence of intra-abdominal infection.  Informed patient's daughter-in-law at bedside and the patient of incidental findings on CT.  He will follow-up with both his primary care doctor and urologist.  Appropriate for discharge at this time [MP]    Clinical Course User Index [MP] Pamella Ozell LABOR, DO  Medical Decision Making 89 year old male with history as above presenting to the ED for abdominal pain.  Suffered a fall at home 2 days ago.  Struck his head and injured his back.  Was not seen at that time.  Recently had Foley catheter placed for obstruction.  Woke this morning with chills and right lower quadrant pain.  Afebrile hemodynamically stable here.  Does have significant right lower quadrant tenderness on my exam.  Also some midline thoracic tenderness.  Considering that he struck his head during the fall will obtain CT head and C-spine to look for traumatic injury.  Will obtain CT chest abdomen pelvis to look for traumatic injuries most likely in the thoracic region along with intra-abdominal infection such as acute appendicitis given shaking chills and right lower quadrant tenderness.  Will also obtain UA considering catheter associated UTI as potential cause of his abdominal pain shaking chills  Amount and/or Complexity of Data Reviewed Labs: ordered. Radiology: ordered.  Risk Prescription drug management.        Final diagnoses:  Abdominal pain,  unspecified abdominal location  Foley catheter in place    ED Discharge Orders     None          Pamella Ozell LABOR, DO 08/23/23 1018

## 2023-08-25 LAB — URINE CULTURE: Culture: >=100000 — AB

## 2023-08-26 ENCOUNTER — Other Ambulatory Visit: Payer: Self-pay

## 2023-08-26 ENCOUNTER — Other Ambulatory Visit (HOSPITAL_BASED_OUTPATIENT_CLINIC_OR_DEPARTMENT_OTHER): Payer: Self-pay

## 2023-08-26 ENCOUNTER — Telehealth (HOSPITAL_BASED_OUTPATIENT_CLINIC_OR_DEPARTMENT_OTHER): Payer: Self-pay | Admitting: *Deleted

## 2023-08-26 DIAGNOSIS — N35912 Unspecified bulbous urethral stricture, male: Secondary | ICD-10-CM | POA: Diagnosis not present

## 2023-08-26 DIAGNOSIS — R3912 Poor urinary stream: Secondary | ICD-10-CM | POA: Diagnosis not present

## 2023-08-26 DIAGNOSIS — R3914 Feeling of incomplete bladder emptying: Secondary | ICD-10-CM | POA: Diagnosis not present

## 2023-08-26 MED ORDER — CLOTRIMAZOLE-BETAMETHASONE 1-0.05 % EX CREA
TOPICAL_CREAM | CUTANEOUS | 1 refills | Status: DC
  Filled 2023-08-26: qty 60, 30d supply, fill #0

## 2023-08-26 MED ORDER — CEPHALEXIN 500 MG PO CAPS
500.0000 mg | ORAL_CAPSULE | Freq: Two times a day (BID) | ORAL | 0 refills | Status: DC
  Filled 2023-08-26: qty 14, 7d supply, fill #0

## 2023-08-26 NOTE — Telephone Encounter (Signed)
 Post ED Visit - Positive Culture Follow-up: Unsuccessful Patient Follow-up  Culture assessed and recommendations reviewed by:  [x]  Leonor Bash, Pharm.D. []  Venetia Gully, Pharm.D., BCPS AQ-ID []  Garrel Crews, Pharm.D., BCPS []  Almarie Lunger, [address].D., BCPS []  [place], [place].D., BCPS, AAHIVP []  Rosaline Bihari, Pharm.D., BCPS, AAHIVP []  Massie Rigg, PharmD []  Jodie Rower, PharmD, BCPS  Positive urine culture  [x]  Patient discharged without antimicrobial prescription and treatment is now indicated []  Organism is resistant to prescribed ED discharge antimicrobial []  Patient with positive blood cultures  Urine cultures reviewed by  Sherlean Carota PA and  recommendation is  to treat with Keflex 500 mg q 12 hours for 5 days.   Unable to contact patient after 3 attempts, letter will be sent to address on file  [name] 08/26/2023, 3:27 PM

## 2023-09-02 ENCOUNTER — Telehealth (HOSPITAL_BASED_OUTPATIENT_CLINIC_OR_DEPARTMENT_OTHER): Payer: Self-pay | Admitting: *Deleted

## 2023-09-02 NOTE — Telephone Encounter (Signed)
 Post ED Visit - Positive Culture Follow-up: Successful Patient Follow-Up  Culture assessed and recommendations reviewed by:  [x]  Shelby WisePharm.D. []  Venetia Gully, Pharm.D., BCPS AQ-ID []  Garrel Crews, Pharm.D., BCPS []  Almarie Lunger, Pharm.D., BCPS []  [place], [address].D., BCPS, AAHIVP []  Rosaline Bihari, Pharm.D., BCPS, AAHIVP []  Vernell Meier, PharmD, BCPS []  Latanya Hint, PharmD, BCPS []  Donald Medley, PharmD, BCPS []  Rocky Bold, PharmD  Positive urine culture  [x]  Patient discharged without antimicrobial prescription and treatment is now indicated []  Organism is resistant to prescribed ED discharge antimicrobial []  Patient with positive blood cultures  Changes discussed with ED provider: Sherlean Carota Patient visited his urologist and patient is currently taking recommended abx Keflex  500 mg BID for 5 days. Patient is on day 3 Contacted patient, date 0707/2025, time [address]   [name] 09/02/2023, 2:44 PM

## 2023-09-10 ENCOUNTER — Other Ambulatory Visit (HOSPITAL_BASED_OUTPATIENT_CLINIC_OR_DEPARTMENT_OTHER): Payer: Self-pay

## 2023-09-10 MED ORDER — FLUOROURACIL 5 % EX CREA
1.0000 | TOPICAL_CREAM | Freq: Two times a day (BID) | CUTANEOUS | 0 refills | Status: DC
  Filled 2023-09-10: qty 40, 30d supply, fill #0
  Filled 2023-09-12: qty 40, 14d supply, fill #0

## 2023-09-11 ENCOUNTER — Other Ambulatory Visit: Payer: Self-pay

## 2023-09-11 ENCOUNTER — Other Ambulatory Visit (HOSPITAL_BASED_OUTPATIENT_CLINIC_OR_DEPARTMENT_OTHER): Payer: Self-pay

## 2023-09-12 ENCOUNTER — Other Ambulatory Visit (HOSPITAL_BASED_OUTPATIENT_CLINIC_OR_DEPARTMENT_OTHER): Payer: Self-pay

## 2023-09-16 ENCOUNTER — Ambulatory Visit: Attending: Internal Medicine

## 2023-09-16 DIAGNOSIS — Z950 Presence of cardiac pacemaker: Secondary | ICD-10-CM | POA: Diagnosis not present

## 2023-09-16 DIAGNOSIS — I5042 Chronic combined systolic (congestive) and diastolic (congestive) heart failure: Secondary | ICD-10-CM

## 2023-09-17 ENCOUNTER — Telehealth: Payer: Self-pay

## 2023-09-17 NOTE — Telephone Encounter (Signed)
Remote ICM transmission received.  Attempted call to patient regarding ICM remote transmission and left message to return call   

## 2023-09-17 NOTE — Progress Notes (Unsigned)
 EPIC Encounter for ICM Monitoring  Patient Name: [name] is a 89 y.o. male Date: 09/17/2023 Primary Care Physican: Okey Carlin Redbird, MD Primary Cardiologist: Jeffrie Electrophysiologist: Waddell  BiV Pacing: 86% 03/13/2023 Office Weight: 180 lbs 07/16/2023 Weight:  179-180 lbs   Battery Longevity: 5.0 months    Attempted call to patient and unable to reach.  Left message for return call.  Transmission results reviewed.    CorVue thoracic impedance suggesting possible fluid accumulation starting 7/13.   Prescribed:  Torsemide  20 mg Take 1 tablet (20 mg total) by mouth daily except on Monday, Wednesday and Friday take 0.5 tablet (10 mg total). Potassium 10 mEq take 1 tablet daily   Labs: 03/13/2023 Creatinine 1.09, BUN 15, Potassium 4.6, Sodium 130, GFR 63 11/28/2022 Creatinine 1.03, BUN 17, Potassium 4.5, Sodium 130, GFR 67 04/28/2022 Creatinine 1.21, BUN 19, Potassium 4.1, Sodium 133, GFR 56 A complete set of results can be found in Results Review.   Recommendations:  Unable to reach.     Follow-up plan: ICM clinic phone appointment on 09/25/2023 to recheck fluid levels.  91 day device clinic remote transmission 10/02/2023.     EP/Cardiology Office Visits:   09/24/2023 with Dr Waddell.   Next cardiology visit due 11/29/2023 with Dr Jeffrie or Glendia Ferrier, PA (no recall).   Copy of ICM check sent to Dr. Waddell.    3 month ICM trend: 09/16/2023.    12-14 Month ICM trend:     Mitzie GORMAN Garner, RN 09/17/2023 2:17 PM

## 2023-09-24 ENCOUNTER — Encounter: Payer: Self-pay | Admitting: Internal Medicine

## 2023-09-24 ENCOUNTER — Ambulatory Visit: Attending: Internal Medicine | Admitting: Internal Medicine

## 2023-09-24 VITALS — BP 106/60 | HR 78 | Ht 67.0 in | Wt 180.0 lb

## 2023-09-24 DIAGNOSIS — I480 Paroxysmal atrial fibrillation: Secondary | ICD-10-CM

## 2023-09-24 LAB — CUP PACEART INCLINIC DEVICE CHECK
Date Time Interrogation Session: 20250729171204
Implantable Lead Connection Status: 753985
Implantable Lead Connection Status: 753985
Implantable Lead Connection Status: 753985
Implantable Lead Implant Date: 20161205
Implantable Lead Implant Date: 20161205
Implantable Lead Implant Date: 20161205
Implantable Lead Location: 753858
Implantable Lead Location: 753859
Implantable Lead Location: 753860
Implantable Pulse Generator Implant Date: 20161205
Pulse Gen Model: 3262
Pulse Gen Serial Number: 7802901

## 2023-09-24 NOTE — Progress Notes (Signed)
 HPI [name] returns today for followup. He is a pleasant elderly man with symptomatic bradycardia, chronic systolic heart failure with near normalization of his LV function, lung disease, HTN, and is s/p Biv PPM insertion. In the interim, he has been weak. He notes that his strength is unchanged.  He denies chest pain. His chf symptoms are class 2. No syncope. He has become a bit more sedentary. He has a h/o GI bleeding and has not been anti-coagulated.  Allergies  Allergen Reactions   Gabapentin     Other reaction(s): mood changes   Parecoxib Sodium [Parecoxib]    Codeine Anxiety and Other (See Comments)    Insomnia/ hyper   Warfarin Sodium Anxiety and Other (See Comments)    Hx GI bleed     Current Outpatient Medications  Medication Sig Dispense Refill   acetaminophen  (TYLENOL ) 500 MG tablet Take 500 mg by mouth daily as needed for mild pain (pain score 1-3). And take two by mouth twice daily at 6 am and 4:30 pm for pain     ALPHAGAN  P 0.1 % SOLN Instill 1 drop into both eyes twice a day 15 mL 3   amoxicillin  (AMOXIL ) 500 MG capsule Take 1 capsule (500 mg total) by mouth every 8 (eight) hours. 30 capsule 0   benzonatate  (TESSALON ) 100 MG capsule Take 1 capsule (100 mg total) by mouth 3 (three) times daily as needed. 21 capsule 0   brimonidine  (ALPHAGAN ) 0.2 % ophthalmic solution Place 1 drop into both eyes 2 (two) times daily. 15 mL 3   clopidogrel  (PLAVIX ) 75 MG tablet Take 1 tablet (75 mg total) by mouth daily. 90 tablet 3   clotrimazole -betamethasone  (LOTRISONE ) cream Apply a fingertip length amount on the glans of the penis twice per day 60 g 1   diazepam  (VALIUM ) 5 MG tablet Take 1 tablet (5 mg total) by mouth daily as needed. 30 tablet 2   dorzolamide -timolol  (COSOPT ) 2-0.5 % ophthalmic solution Place 1 drop into both eyes 2 (two) times daily. 30 mL 3   esomeprazole  (NEXIUM ) 40 MG capsule Take 40 mg by mouth daily as needed (for acid reflux).     fluorouracil  (EFUDEX ) 5  % cream Apply to the affected area(s) of skin twice daily for 2 weeks. 40 g 0   fluticasone  (FLONASE ) 50 MCG/ACT nasal spray Place 1-2 sprays into both nostrils daily  for 1- 2 weeks then as needed 16 g 0   latanoprost  (XALATAN ) 0.005 % ophthalmic solution Place 1 drop into both eyes every evening. 7.5 mL 1   lidocaine  (LIDODERM ) 5 % Place 1 patch onto the skin daily. Remove & Discard patch within 12 hours or as directed by MD 30 patch 0   nitroGLYCERIN  (NITROSTAT ) 0.4 MG SL tablet Place 1 tablet (0.4 mg total) under the tongue every 5 (five) minutes as needed for chest pain. 20 tablet 0   potassium chloride  (KLOR-CON  M) 10 MEQ tablet Take 1 tablet (10 mEq total) by mouth daily with food. 90 tablet 1   Probiotic Product (PROBIOTIC PO) Take 1 capsule by mouth daily.     tamsulosin  (FLOMAX ) 0.4 MG CAPS capsule Take 1 capsule (0.4 mg total) by mouth at bedtime. 90 capsule 3   torsemide  (DEMADEX ) 20 MG tablet Take 1 tablet (20 mg) daily except on Monday, Wednesday, and Friday take 1/2 tablet (10mg ) 90 tablet 3   traMADol  (ULTRAM ) 50 MG tablet Take 1 tablet (50 mg total) by mouth 2 (two)  times daily as needed. 60 tablet 2   ALPHAGAN  P 0.1 % SOLN Instill 1 drop into both eyes twice a day (Patient not taking: Reported on 09/24/2023) 15 mL 3   brimonidine  (ALPHAGAN ) 0.2 % ophthalmic solution Place 1 drop into both eyes 2 (two) times daily. (Patient not taking: Reported on 09/24/2023) 10 mL 11   cephALEXin  (KEFLEX ) 500 MG capsule Take 1 capsule (500 mg total) by mouth 2 (two) times daily. (Patient not taking: Reported on 09/24/2023) 14 capsule 0   diazepam  (VALIUM ) 5 MG tablet Take 1 tablet (5 mg total) by mouth daily as needed. (Patient not taking: Reported on 09/24/2023) 30 tablet 2   dorzolamide -timolol  (COSOPT ) 2-0.5 % ophthalmic solution Place 1 drop into both eyes 2 (two) times daily. (Patient not taking: Reported on 09/24/2023) 30 mL 3   dorzolamide -timolol  (COSOPT ) 2-0.5 % ophthalmic solution Instill 1 drop  into both eyes twice a day (Patient not taking: Reported on 09/24/2023) 30 mL 3   esomeprazole  (NEXIUM ) 40 MG capsule Take 1 capsule (40 mg total) by mouth every morning 30 minutes to 1 hour before meal as needed. (Patient not taking: Reported on 09/24/2023) 90 capsule 1   latanoprost  (XALATAN ) 0.005 % ophthalmic solution Place 1 drop into both eyes at bedtime. (Patient not taking: Reported on 09/24/2023) 7.5 mL 3   latanoprost  (XALATAN ) 0.005 % ophthalmic solution Place 1 drop into both eyes at bedtime. (Patient not taking: Reported on 09/24/2023) 7.5 mL 3   potassium chloride  (KLOR-CON  M) 10 MEQ tablet Take 10 mEq by mouth daily. (Patient not taking: Reported on 09/24/2023)     traMADol  (ULTRAM ) 50 MG tablet Take 50 mg by mouth 2 (two) times daily as needed. (Patient not taking: Reported on 09/24/2023)     traMADol  (ULTRAM ) 50 MG tablet Take 1 tablet (50 mg total) by mouth 2 (two) times daily as needed. (Patient not taking: Reported on 09/24/2023) 60 tablet 2   No current facility-administered medications for this visit.     Past Medical History:  Diagnosis Date   Anxiety    Arthritis    Atrial fibrillation, permanent (HCC) 01/07/2016   Blood transfusion    related to a surgery   BPH (benign prostatic hypertrophy) with urinary obstruction    s/p turp yrs ago   Bradycardia    a. Amio d/c'd 08/2013; brady arrest 08/2013 after PCI >>> recurrent AF >>> Amiodarone  restarted. b. Pacemaker being considered in 11/2014.   CAD (coronary artery disease)    a. s/p MI and prior PCI of LAD;  b. LHC (08/2013):  prox LAD 60-70%, mid LAD stents ok, ostial lesion at Dx jailed by stent, mild CFX and RCA disesase >>>  PCI (09/08/13):  rotational atherectomy + Promus DES to prox LAD   Cardiomyopathy [facility])    a. Echo (08/2013):  EF 30-35%, AS hypokinesis, Gr 1 diast dysfn, mild MR, mild LAE >>> b. improved EF 50-55% by echo 8/15. c. EF down again by echo 12/2014 to 30-35% but 51% by nuc.   Chronic lower back pain     Chronic systolic CHF (congestive heart failure) (HCC)    CKD (chronic kidney disease), stage III (HCC)    a. Per review of labs baseline Cr 1.1-1.3.   DDD (degenerative disc disease)    chronic back pain   Depression    Diverticulitis of colon with bleeding    s/p sigmoid resection '88   DJD (degenerative joint disease) hips and knees   s/p bilateral total  replacements   GERD (gastroesophageal reflux disease)    occ. take prevacid   Glaucoma    Hearing impaired person, right    History of GI diverticular bleed 05/2010   transfused blood and resolved without surgical intervention   Hypertension    Impaired hearing bilateral    only left hearing aid   Impotence, organic    s/p penile prosthesis 1990's   Incomplete bladder emptying    LBBB (left bundle branch block)    Meningioma (HCC) right -sided w/ right VI palsy   followed by dr susen   Mitral regurgitation    a. Mild-mod by echo 12/2014.   Myocardial infarction (HCC) 1980's- medical intervention   so mild I didn't know I'd had it   Pacemaker    PAF (paroxysmal atrial fibrillation) (HCC)    not on coumadin due to hx of GI bleed.   Prostate cancer (HCC) 11/30/2013   Gleason 8, volume 22.14 cc   Sleep apnea    non-compliant cpap   Uses hearing aid    left    Vitreous hemorrhage (HCC)    related to branch retinal vein occlusion left eye   Wears glasses     ROS:   All systems reviewed and negative except as noted in the HPI.   Past Surgical History:  Procedure Laterality Date   APPENDECTOMY     CARDIAC CATHETERIZATION  2007   noncritical cad (results w/ chart)   CARDIOVERSION N/A 10/13/2015   Procedure: CARDIOVERSION;  Surgeon: Maude JAYSON Emmer, MD;  Location: [facility] ENDOSCOPY;  Service: Cardiovascular;  Laterality: N/A;   CATARACT EXTRACTION W/ INTRAOCULAR LENS  IMPLANT, BILATERAL Bilateral ~ 2000   CHOLECYSTECTOMY     CLOSED REDUCTION HIP DISLOCATION Right several   CORONARY ANGIOPLASTY WITH STENT PLACEMENT   08-03-08   drug-eluting stent x2 distal and mid lad   EP IMPLANTABLE DEVICE N/A 01/31/2015   Procedure: BiV Pacemaker Insertion CRT-P;  Surgeon: Danelle LELON Birmingham, MD;  Location: [facility] INVASIVE CV LAB;  Service: Cardiovascular;  Laterality: N/A;   EP IMPLANTABLE DEVICE N/A 02/07/2015   Procedure: Lead Revision/Repair;  Surgeon: Elspeth JAYSON Sage, MD;  Location: [facility] INVASIVE CV LAB;  Service: Cardiovascular;  Laterality: N/A;   ESOPHAGOGASTRODUODENOSCOPY N/A 02/11/2014   Procedure: ESOPHAGOGASTRODUODENOSCOPY (EGD);  Surgeon: Lamar Donnald GAILS, MD;  Location: [facility] ENDOSCOPY;  Service: Endoscopy;  Laterality: N/A;   gamma knife radiation  2000   [facility] for meningioma, last eval 2013- no change   GAS INSERTION Left 07/14/2019   Procedure: INSERTION OF GAS;  Surgeon: Alvia Norleen BIRCH, MD;  Location: [facility] OR;  Service: Ophthalmology;  Laterality: Left;   INGUINAL HERNIA REPAIR Bilateral    INNER EAR SURGERY Right yrs ago   trying to get my hearing back   LEFT HEART CATHETERIZATION WITH CORONARY ANGIOGRAM N/A 09/04/2013   Procedure: LEFT HEART CATHETERIZATION WITH CORONARY ANGIOGRAM;  Surgeon: Candyce GORMAN Reek, MD;  Location: [facility] CATH LAB;  Service: Cardiovascular;  Laterality: N/A;   PARS PLANA VITRECTOMY Left 07/14/2019   27 GAUGE   PARS PLANA VITRECTOMY 27 GAUGE Left 07/14/2019   Procedure: PARS PLANA VITRECTOMY 27 GAUGE;  Surgeon: Alvia Norleen BIRCH, MD;  Location: [facility] OR;  Service: Ophthalmology;  Laterality: Left;   PENILE PROSTHESIS IMPLANT  1990's   PERCUTANEOUS CORONARY ROTOBLATOR INTERVENTION (PCI-R) N/A 09/08/2013   Procedure: PERCUTANEOUS CORONARY ROTOBLATOR INTERVENTION (PCI-R);  Surgeon: Candyce GORMAN Reek, MD;  Location: [facility] CATH LAB;  Service: Cardiovascular;  Laterality: N/A;   PHOTOCOAGULATION WITH LASER  Left 07/14/2019   Procedure: PHOTOCOAGULATION WITH LASER;  Surgeon: Alvia Norleen BIRCH, MD;  Location: [facility] OR;  Service: Ophthalmology;  Laterality: Left;   PROSTATE BIOPSY  11/30/13   Gleason 8, vol  22.14 cc   REVISION TOTAL KNEE ARTHROPLASTY Right    SHOULDER OPEN ROTATOR CUFF REPAIR Left    TOTAL HIP ARTHROPLASTY Right 03-25-08--   TOTAL HIP ARTHROPLASTY Left 2005   TOTAL HIP REVISION Right 3-4 times   TOTAL KNEE ARTHROPLASTY Right 2004   TOTAL KNEE ARTHROPLASTY Left 1997   TRANSURETHRAL RESECTION OF PROSTATE  years ago   TRANSURETHRAL RESECTION OF PROSTATE  01/08/2011   Procedure: TRANSURETHRAL RESECTION OF THE PROSTATE (TURP);  Surgeon: Garnette Shack;  Location: [facility];  Service: Urology;  Laterality: N/A;  GYRUS      Family History  Problem Relation Age of Onset   Heart disease Mother    Heart attack Mother    Emphysema Father    Cancer Brother        liver cancer   Cancer Other      Social History   Socioeconomic History   Marital status: Widowed    Spouse name: Not on file   Number of children: Not on file   Years of education: Not on file   Highest education level: Not on file  Occupational History   Not on file  Tobacco Use   Smoking status: Former    Current packs/day: 0.00    Average packs/day: 1 pack/day for 14.0 years (14.0 ttl pk-yrs)    Types: Cigarettes    Start date: 01/04/1945    Quit date: 01/05/1959    Years since quitting: 64.7   Smokeless tobacco: Never  Vaping Use   Vaping status: Never Used  Substance and Sexual Activity   Alcohol use: Yes    Comment: social    Drug use: No   Sexual activity: Not Currently  Other Topics Concern   Not on file  Social History Narrative   Not on file   Social Drivers of Health   Financial Resource Strain: Not on file  Food Insecurity: Not on file  Transportation Needs: No Transportation Needs (05/10/2022)   PRAPARE - Administrator, Civil Service (Medical): No    Lack of Transportation (Non-Medical): No  Physical Activity: Not on file  Stress: Not on file  Social Connections: Not on file  Intimate Partner Violence: Not on file     BP 106/60   Pulse 78    Ht 5' 7 (1.702 m)   Wt 180 lb (81.6 kg)   SpO2 96%   BMI 28.19 kg/m   Physical Exam:  elderly appearing NAD HEENT: Unremarkable Neck:  No JVD, no thyromegally Lymphatics:  No adenopathy Back:  No CVA tenderness Lungs:  Clear with no wheezes HEART:  Regular rate rhythm, no murmurs, no rubs, no clicks Abd:  soft, positive bowel sounds, no organomegally, no rebound, no guarding Ext:  2 plus pulses, no edema, no cyanosis, no clubbing Skin:  No rashes no nodules Neuro:  CN II through XII intact, motor grossly intact  DEVICE  Normal device function.  See PaceArt for details.   Assess/Plan:  Acute on chronic systolic/diastolic heart failure - his symptoms remain class 2. He will continue his medical therapy. His [place] is up a bit. 2. Atrial fib/flutter- he is well controlled. He is Biv pacing 84% of the time. He is not anti-coagulated due to a h/o GI bleeding 3.  PPM - his [employer] biv ppm is working normally. We will recheck in several months. He is programmed VVIR 4. HTN - his bp is well controlled. He usually runs a little lower.  5. Fatigue and weakness - he will have his TSH, ESR, liver panel, cbc and bmp checked. Additional recs based on the results of the labs.   Danelle Waddell HERO.D

## 2023-09-24 NOTE — Patient Instructions (Signed)

## 2023-09-25 ENCOUNTER — Ambulatory Visit: Attending: Internal Medicine

## 2023-09-25 DIAGNOSIS — I5042 Chronic combined systolic (congestive) and diastolic (congestive) heart failure: Secondary | ICD-10-CM

## 2023-09-25 DIAGNOSIS — Z950 Presence of cardiac pacemaker: Secondary | ICD-10-CM

## 2023-09-25 NOTE — Progress Notes (Signed)
 EPIC Encounter for ICM Monitoring  Patient Name: [name] is a 89 y.o. male Date: 09/25/2023 Primary Care Physican: Okey Carlin Redbird, MD Primary Cardiologist: Jeffrie Electrophysiologist: Waddell  BiV Pacing: 96% 03/13/2023 Office Weight: 180 lbs 07/16/2023 Weight:  179-180 lbs 09/24/2023 Office Weight: 180 lbs   Battery Longevity: 4.9 months    Transmission results reviewed.    CorVue thoracic impedance suggesting fluid levels returned to normal 7/26.   Prescribed:  Torsemide  20 mg Take 1 tablet (20 mg total) by mouth daily except on Monday, Wednesday and Friday take 0.5 tablet (10 mg total). Potassium 10 mEq take 1 tablet daily   Labs: 08/23/2023 Creatinine 1.13, BUN 16, Potassium 4.6, Sodium 132, GFR >60  08/18/2023 Creatinine 1.05, BUN 21, Potassium 4.3, Sodium 135, GFR >60  07/07/2023 Creatinine 1.06, BUN 17, Potassium 4.4, Sodium 135, GFR >60  A complete set of results can be found in Results Review.   Recommendations:  No changes.    Follow-up plan: ICM clinic phone appointment on 10/21/2023.  91 day device clinic remote transmission 10/02/2023.     EP/Cardiology Office Visits:  Recall 09/18/2024 with Dr Almetta.   Next cardiology visit due 11/29/2023 with Dr Jeffrie or Glendia Ferrier, PA (no recall).   Copy of ICM check sent to Dr. Waddell.    3 month ICM trend: 09/25/2023.    12-14 Month ICM trend:     Mitzie GORMAN Garner, RN 09/25/2023 7:44 AM

## 2023-10-02 ENCOUNTER — Ambulatory Visit: Payer: Medicare HMO

## 2023-10-03 ENCOUNTER — Ambulatory Visit: Payer: Self-pay | Admitting: Internal Medicine

## 2023-10-03 ENCOUNTER — Telehealth: Payer: Self-pay

## 2023-10-03 LAB — CUP PACEART REMOTE DEVICE CHECK
Battery Remaining Longevity: 5 mo
Battery Remaining Percentage: 5 %
Battery Voltage: 2.77 V
Date Time Interrogation Session: 20250806052052
Implantable Lead Connection Status: 753985
Implantable Lead Connection Status: 753985
Implantable Lead Connection Status: 753985
Implantable Lead Implant Date: 20161205
Implantable Lead Implant Date: 20161205
Implantable Lead Implant Date: 20161205
Implantable Lead Location: 753858
Implantable Lead Location: 753859
Implantable Lead Location: 753860
Implantable Pulse Generator Implant Date: 20161205
Lead Channel Impedance Value: 1025 Ohm
Lead Channel Impedance Value: 460 Ohm
Lead Channel Pacing Threshold Amplitude: 0.75 V
Lead Channel Pacing Threshold Amplitude: 0.75 V
Lead Channel Pacing Threshold Pulse Width: 0.5 ms
Lead Channel Pacing Threshold Pulse Width: 0.8 ms
Lead Channel Sensing Intrinsic Amplitude: 12 mV
Lead Channel Setting Pacing Amplitude: 1.75 V
Lead Channel Setting Pacing Amplitude: 2.5 V
Lead Channel Setting Pacing Pulse Width: 0.5 ms
Lead Channel Setting Pacing Pulse Width: 0.8 ms
Lead Channel Setting Sensing Sensitivity: 2 mV
Pulse Gen Model: 3262
Pulse Gen Serial Number: 7802901

## 2023-10-03 NOTE — Telephone Encounter (Signed)
  Battery estimated 5.33mo - route to triage for IFU   LM on patients VM to call back. Monthly IFU battery check schedule has been made and adjusted in [employer].

## 2023-10-04 NOTE — Telephone Encounter (Signed)
 Remotes have been consistent. Merlin monitor is automatic.

## 2023-10-21 ENCOUNTER — Ambulatory Visit: Attending: Internal Medicine

## 2023-10-21 ENCOUNTER — Other Ambulatory Visit: Payer: Self-pay | Admitting: Internal Medicine

## 2023-10-21 DIAGNOSIS — I5042 Chronic combined systolic (congestive) and diastolic (congestive) heart failure: Secondary | ICD-10-CM

## 2023-10-21 DIAGNOSIS — Z950 Presence of cardiac pacemaker: Secondary | ICD-10-CM

## 2023-10-22 MED ORDER — TORSEMIDE 20 MG PO TABS: ORAL_TABLET | ORAL | 1 refills | Status: DC

## 2023-10-23 ENCOUNTER — Telehealth: Payer: Self-pay

## 2023-10-23 NOTE — Progress Notes (Signed)
 EPIC Encounter for ICM Monitoring  Patient Name: [name] is a 89 y.o. male Date: 10/23/2023 Primary Care Physican: Okey Carlin Redbird, MD Primary Cardiologist: Jeffrie Electrophysiologist: Waddell  BiV Pacing: 88% 03/13/2023 Office Weight: 180 lbs 07/16/2023 Weight:  179-180 lbs 09/24/2023 Office Weight: 180 lbs   Battery Longevity: 4.4 months    Attempted call to patient and unable to reach.  Left detailed message per DPR regarding transmission.  Transmission results reviewed.    CorVue thoracic impedance suggesting possible fluid accumulation starting 8/23.  Possible dryness 8/1-8/11.   Prescribed:  Torsemide  20 mg Take 1 tablet (20 mg total) by mouth daily except on Monday, Wednesday and Friday take 0.5 tablet (10 mg total). Potassium 10 mEq take 1 tablet daily   Labs: 08/23/2023 Creatinine 1.13, BUN 16, Potassium 4.6, Sodium 132, GFR >60  08/18/2023 Creatinine 1.05, BUN 21, Potassium 4.3, Sodium 135, GFR >60  07/07/2023 Creatinine 1.06, BUN 17, Potassium 4.4, Sodium 135, GFR >60  A complete set of results can be found in Results Review.   Recommendations:  Left voice mail with ICM number and encouraged to call if experiencing any fluid symptoms.   Follow-up plan: ICM clinic phone appointment on 10/29/2023 to recheck fluid levels.  91 day device clinic remote transmission 01/06/2024.     EP/Cardiology Office Visits:  Recall 09/18/2024 with Dr Almetta.   Next cardiology visit due 11/29/2023 with Dr Jeffrie or Glendia Ferrier, PA (no recall).   Copy of ICM check sent to Dr. Waddell.    3 month ICM trend: 10/21/2023.    12-14 Month ICM trend:     Mitzie GORMAN Garner, RN 10/23/2023 8:28 AM

## 2023-10-23 NOTE — Telephone Encounter (Signed)
Remote ICM transmission received.  Attempted call to patient regarding ICM remote transmission and left detailed message per DPR.  Left ICM phone number and advised to return call for any fluid symptoms or questions.

## 2023-10-24 NOTE — Progress Notes (Signed)
Returned patient call and left message

## 2023-10-24 NOTE — Progress Notes (Signed)
 Spoke with patient and heart failure questions reviewed.  Transmission results reviewed.  Pt asymptomatic for fluid accumulation.   He reports he takes Torsemide  10 mg daily.  Advised to take Torsemide  20 mg 1 tablet daily x 2 days then return to 0.5 tablet daily.  He verbalized understanding.

## 2023-10-29 ENCOUNTER — Ambulatory Visit: Attending: Internal Medicine

## 2023-10-29 ENCOUNTER — Telehealth: Payer: Self-pay

## 2023-10-29 DIAGNOSIS — Z950 Presence of cardiac pacemaker: Secondary | ICD-10-CM

## 2023-10-29 DIAGNOSIS — I5042 Chronic combined systolic (congestive) and diastolic (congestive) heart failure: Secondary | ICD-10-CM

## 2023-10-29 NOTE — Progress Notes (Signed)
 EPIC Encounter for ICM Monitoring  Patient Name: [name] is a 89 y.o. male Date: 10/29/2023 Primary Care Physican: Okey Carlin Redbird, MD Primary Cardiologist: Jeffrie Electrophysiologist: Waddell  BiV Pacing: 88% 03/13/2023 Office Weight: 180 lbs 07/16/2023 Weight:  179-180 lbs 09/24/2023 Office Weight: 180 lbs   Battery Longevity: 4.5 months    Attempted call to patient and unable to reach.  Left detailed message per DPR regarding transmission.  Transmission results reviewed.    CorVue thoracic impedance suggesting fluid levels returned to normal 9/2 after recommendation to take Torsemide  20 mg daily x 2 days.   Prescribed:  Torsemide  20 mg Take 1 tablet (20 mg total) by mouth daily except on Monday, Wednesday and Friday take 0.5 tablet (10 mg total).  Pt reports he takes 10 mg daily.   Potassium 10 mEq take 1 tablet daily   Labs: 08/23/2023 Creatinine 1.13, BUN 16, Potassium 4.6, Sodium 132, GFR >60  08/18/2023 Creatinine 1.05, BUN 21, Potassium 4.3, Sodium 135, GFR >60  07/07/2023 Creatinine 1.06, BUN 17, Potassium 4.4, Sodium 135, GFR >60  A complete set of results can be found in Results Review.   Recommendations:  Left voice mail with ICM number and encouraged to call if experiencing any fluid symptoms.   Follow-up plan: ICM clinic phone appointment on 11/25/2023.  91 day device clinic remote transmission 01/06/2024.     EP/Cardiology Office Visits:  Recall 09/18/2024 with Dr Almetta.   Next cardiology visit due 11/29/2023 with Dr Jeffrie or Glendia Ferrier, PA (no recall).   Copy of ICM check sent to Dr. Waddell.    3 month ICM trend: 10/29/2023.    12-14 Month ICM trend:     Mitzie GORMAN Garner, RN 10/29/2023 2:46 PM

## 2023-10-29 NOTE — Telephone Encounter (Signed)
 Remote ICM transmission received.  Attempted call to patient regarding ICM remote transmission and left detailed message per DPR.  Left ICM phone number and advised to return call for any fluid symptoms or questions. Next ICM remote transmission scheduled 11/25/2023.

## 2023-10-30 ENCOUNTER — Other Ambulatory Visit (HOSPITAL_BASED_OUTPATIENT_CLINIC_OR_DEPARTMENT_OTHER): Payer: Self-pay

## 2023-11-05 ENCOUNTER — Ambulatory Visit (INDEPENDENT_AMBULATORY_CARE_PROVIDER_SITE_OTHER)

## 2023-11-05 DIAGNOSIS — I442 Atrioventricular block, complete: Secondary | ICD-10-CM

## 2023-11-06 LAB — CUP PACEART REMOTE DEVICE CHECK
Battery Remaining Longevity: 5 mo
Battery Remaining Percentage: 4 %
Battery Voltage: 2.75 V
Date Time Interrogation Session: 20250909020025
Implantable Lead Connection Status: 753985
Implantable Lead Connection Status: 753985
Implantable Lead Connection Status: 753985
Implantable Lead Implant Date: 20161205
Implantable Lead Implant Date: 20161205
Implantable Lead Implant Date: 20161205
Implantable Lead Location: 753858
Implantable Lead Location: 753859
Implantable Lead Location: 753860
Implantable Pulse Generator Implant Date: 20161205
Lead Channel Impedance Value: 450 Ohm
Lead Channel Impedance Value: 960 Ohm
Lead Channel Pacing Threshold Amplitude: 0.75 V
Lead Channel Pacing Threshold Amplitude: 0.75 V
Lead Channel Pacing Threshold Pulse Width: 0.5 ms
Lead Channel Pacing Threshold Pulse Width: 0.8 ms
Lead Channel Sensing Intrinsic Amplitude: 8.5 mV
Lead Channel Setting Pacing Amplitude: 1.75 V
Lead Channel Setting Pacing Amplitude: 2.5 V
Lead Channel Setting Pacing Pulse Width: 0.5 ms
Lead Channel Setting Pacing Pulse Width: 0.8 ms
Lead Channel Setting Sensing Sensitivity: 2 mV
Pulse Gen Model: 3262
Pulse Gen Serial Number: 7802901

## 2023-11-12 ENCOUNTER — Ambulatory Visit: Payer: Self-pay | Admitting: Internal Medicine

## 2023-11-19 NOTE — Addendum Note (Signed)
 Addended by: VICCI SELLER A on: 11/19/2023 01:43 PM   Modules accepted: Orders, Level of Service

## 2023-11-19 NOTE — Progress Notes (Signed)
 Remote pacemaker transmission.

## 2023-11-27 NOTE — Progress Notes (Signed)
 No ICM remote transmission received for 11/25/2023 and next ICM transmission scheduled for 12/16/2023.

## 2023-11-27 DEATH — deceased

## 2023-12-06 ENCOUNTER — Encounter

## 2023-12-16 ENCOUNTER — Encounter

## 2023-12-18 NOTE — Progress Notes (Signed)
 No ICM remote transmission received for 12/16/2023 and next ICM transmission scheduled for 12/30/2023.

## 2023-12-30 ENCOUNTER — Ambulatory Visit: Payer: Self-pay

## 2024-01-06 ENCOUNTER — Encounter

## 2024-02-06 ENCOUNTER — Encounter

## 2024-03-09 ENCOUNTER — Encounter

## 2024-04-09 ENCOUNTER — Encounter

## 2024-05-11 ENCOUNTER — Encounter

## 2024-06-11 ENCOUNTER — Encounter
